# Patient Record
Sex: Male | Born: 1950
Health system: Southern US, Community
[De-identification: ages and names within clinical notes are randomized; demographics above are authoritative.]

## PROBLEM LIST (undated history)

## (undated) DIAGNOSIS — D696 Thrombocytopenia, unspecified: Secondary | ICD-10-CM

## (undated) DIAGNOSIS — IMO0002 Reserved for concepts with insufficient information to code with codable children: Secondary | ICD-10-CM

## (undated) DIAGNOSIS — N182 Chronic kidney disease, stage 2 (mild): Secondary | ICD-10-CM

## (undated) DIAGNOSIS — K219 Gastro-esophageal reflux disease without esophagitis: Secondary | ICD-10-CM

## (undated) DIAGNOSIS — J449 Chronic obstructive pulmonary disease, unspecified: Secondary | ICD-10-CM

## (undated) DIAGNOSIS — E669 Obesity, unspecified: Secondary | ICD-10-CM

## (undated) DIAGNOSIS — I2510 Atherosclerotic heart disease of native coronary artery without angina pectoris: Secondary | ICD-10-CM

## (undated) DIAGNOSIS — K5790 Diverticulosis of intestine, part unspecified, without perforation or abscess without bleeding: Secondary | ICD-10-CM

## (undated) DIAGNOSIS — M1990 Unspecified osteoarthritis, unspecified site: Secondary | ICD-10-CM

## (undated) DIAGNOSIS — I10 Essential (primary) hypertension: Secondary | ICD-10-CM

## (undated) DIAGNOSIS — M545 Low back pain, unspecified: Secondary | ICD-10-CM

## (undated) DIAGNOSIS — G4733 Obstructive sleep apnea (adult) (pediatric): Secondary | ICD-10-CM

## (undated) DIAGNOSIS — D126 Benign neoplasm of colon, unspecified: Secondary | ICD-10-CM

## (undated) DIAGNOSIS — I219 Acute myocardial infarction, unspecified: Secondary | ICD-10-CM

## (undated) DIAGNOSIS — J690 Pneumonitis due to inhalation of food and vomit: Secondary | ICD-10-CM

## (undated) DIAGNOSIS — T7840XA Allergy, unspecified, initial encounter: Secondary | ICD-10-CM

## (undated) DIAGNOSIS — F419 Anxiety disorder, unspecified: Secondary | ICD-10-CM

## (undated) DIAGNOSIS — I5032 Chronic diastolic (congestive) heart failure: Secondary | ICD-10-CM

## (undated) DIAGNOSIS — I4510 Unspecified right bundle-branch block: Secondary | ICD-10-CM

## (undated) DIAGNOSIS — I639 Cerebral infarction, unspecified: Secondary | ICD-10-CM

## (undated) DIAGNOSIS — E785 Hyperlipidemia, unspecified: Secondary | ICD-10-CM

## (undated) HISTORY — DX: Benign neoplasm of colon, unspecified: D12.6

## (undated) HISTORY — DX: Unspecified right bundle-branch block: I45.10

## (undated) HISTORY — PX: CARDIAC CATHETERIZATION: SHX172

## (undated) HISTORY — DX: Pneumonitis due to inhalation of food and vomit: J69.0

## (undated) HISTORY — DX: Chronic diastolic (congestive) heart failure: I50.32

## (undated) HISTORY — PX: UVULOPALATOPHARYNGOPLASTY: SHX827

## (undated) HISTORY — DX: Thrombocytopenia, unspecified: D69.6

## (undated) HISTORY — DX: Chronic kidney disease, stage 2 (mild): N18.2

## (undated) HISTORY — DX: Low back pain: M54.5

## (undated) HISTORY — DX: Gastro-esophageal reflux disease without esophagitis: K21.9

## (undated) HISTORY — DX: Essential (primary) hypertension: I10

## (undated) HISTORY — DX: Hyperlipidemia, unspecified: E78.5

## (undated) HISTORY — DX: Anxiety disorder, unspecified: F41.9

## (undated) HISTORY — DX: Atherosclerotic heart disease of native coronary artery without angina pectoris: I25.10

## (undated) HISTORY — DX: Obstructive sleep apnea (adult) (pediatric): G47.33

## (undated) HISTORY — DX: Unspecified osteoarthritis, unspecified site: M19.90

## (undated) HISTORY — DX: Diverticulosis of intestine, part unspecified, without perforation or abscess without bleeding: K57.90

## (undated) HISTORY — DX: Obesity, unspecified: E66.9

## (undated) HISTORY — DX: Low back pain, unspecified: M54.50

## (undated) HISTORY — DX: Reserved for concepts with insufficient information to code with codable children: IMO0002

## (undated) HISTORY — DX: Cerebral infarction, unspecified: I63.9

## (undated) HISTORY — DX: Acute myocardial infarction, unspecified: I21.9

## (undated) HISTORY — DX: Allergy, unspecified, initial encounter: T78.40XA

---

## 1998-10-15 ENCOUNTER — Ambulatory Visit: Admission: RE | Admit: 1998-10-15 | Discharge: 1998-10-15 | Payer: Self-pay | Admitting: Pulmonary Disease

## 1999-08-26 ENCOUNTER — Other Ambulatory Visit: Admission: RE | Admit: 1999-08-26 | Discharge: 1999-08-26 | Payer: Self-pay | Admitting: Gastroenterology

## 1999-08-26 ENCOUNTER — Encounter (INDEPENDENT_AMBULATORY_CARE_PROVIDER_SITE_OTHER): Payer: Self-pay | Admitting: Specialist

## 2002-04-19 DIAGNOSIS — I219 Acute myocardial infarction, unspecified: Secondary | ICD-10-CM

## 2002-04-19 HISTORY — DX: Acute myocardial infarction, unspecified: I21.9

## 2002-06-06 ENCOUNTER — Ambulatory Visit (HOSPITAL_COMMUNITY): Admission: RE | Admit: 2002-06-06 | Discharge: 2002-06-07 | Payer: Self-pay | Admitting: *Deleted

## 2002-06-06 ENCOUNTER — Encounter (INDEPENDENT_AMBULATORY_CARE_PROVIDER_SITE_OTHER): Payer: Self-pay | Admitting: Specialist

## 2003-01-08 ENCOUNTER — Inpatient Hospital Stay (HOSPITAL_COMMUNITY): Admission: AD | Admit: 2003-01-08 | Discharge: 2003-01-10 | Payer: Self-pay | Admitting: Internal Medicine

## 2003-01-10 ENCOUNTER — Encounter: Payer: Self-pay | Admitting: Internal Medicine

## 2003-01-21 ENCOUNTER — Encounter (HOSPITAL_COMMUNITY): Admission: RE | Admit: 2003-01-21 | Discharge: 2003-04-21 | Payer: Self-pay | Admitting: *Deleted

## 2003-02-16 ENCOUNTER — Emergency Department (HOSPITAL_COMMUNITY): Admission: EM | Admit: 2003-02-16 | Discharge: 2003-02-17 | Payer: Self-pay | Admitting: Emergency Medicine

## 2003-04-20 ENCOUNTER — Inpatient Hospital Stay (HOSPITAL_COMMUNITY): Admission: EM | Admit: 2003-04-20 | Discharge: 2003-04-21 | Payer: Self-pay | Admitting: Emergency Medicine

## 2003-04-20 ENCOUNTER — Encounter (INDEPENDENT_AMBULATORY_CARE_PROVIDER_SITE_OTHER): Payer: Self-pay | Admitting: Specialist

## 2004-03-04 ENCOUNTER — Ambulatory Visit: Payer: Self-pay | Admitting: Pulmonary Disease

## 2004-08-12 ENCOUNTER — Ambulatory Visit: Payer: Self-pay | Admitting: *Deleted

## 2004-08-31 ENCOUNTER — Ambulatory Visit: Payer: Self-pay | Admitting: Pulmonary Disease

## 2004-11-02 ENCOUNTER — Ambulatory Visit: Payer: Self-pay

## 2004-11-17 ENCOUNTER — Ambulatory Visit: Payer: Self-pay | Admitting: Internal Medicine

## 2004-12-10 ENCOUNTER — Ambulatory Visit: Payer: Self-pay | Admitting: Pulmonary Disease

## 2005-01-29 ENCOUNTER — Ambulatory Visit: Payer: Self-pay | Admitting: Internal Medicine

## 2005-03-10 ENCOUNTER — Ambulatory Visit: Payer: Self-pay | Admitting: Pulmonary Disease

## 2005-03-16 ENCOUNTER — Ambulatory Visit: Payer: Self-pay | Admitting: Internal Medicine

## 2005-04-28 ENCOUNTER — Encounter: Admission: RE | Admit: 2005-04-28 | Discharge: 2005-04-28 | Payer: Self-pay | Admitting: Family Medicine

## 2005-07-19 ENCOUNTER — Ambulatory Visit: Payer: Self-pay | Admitting: Internal Medicine

## 2005-09-02 ENCOUNTER — Ambulatory Visit: Payer: Self-pay | Admitting: Pulmonary Disease

## 2005-12-22 ENCOUNTER — Ambulatory Visit: Payer: Self-pay | Admitting: Gastroenterology

## 2006-01-19 ENCOUNTER — Ambulatory Visit: Payer: Self-pay | Admitting: Gastroenterology

## 2006-01-25 ENCOUNTER — Ambulatory Visit: Payer: Self-pay | Admitting: Internal Medicine

## 2006-07-19 ENCOUNTER — Ambulatory Visit: Payer: Self-pay | Admitting: Pulmonary Disease

## 2006-07-19 LAB — CONVERTED CEMR LAB
ALT: 24 units/L (ref 0–40)
AST: 22 units/L (ref 0–37)
Albumin: 3.3 g/dL — ABNORMAL LOW (ref 3.5–5.2)
Alkaline Phosphatase: 62 units/L (ref 39–117)
BUN: 14 mg/dL (ref 6–23)
Basophils Absolute: 0.1 10*3/uL (ref 0.0–0.1)
Basophils Relative: 0.7 % (ref 0.0–1.0)
Bilirubin, Direct: 0.1 mg/dL (ref 0.0–0.3)
CO2: 29 meq/L (ref 19–32)
Calcium: 9 mg/dL (ref 8.4–10.5)
Chloride: 103 meq/L (ref 96–112)
Cholesterol: 103 mg/dL (ref 0–200)
Creatinine, Ser: 1.1 mg/dL (ref 0.4–1.5)
Eosinophils Absolute: 0.3 10*3/uL (ref 0.0–0.6)
Eosinophils Relative: 3.5 % (ref 0.0–5.0)
GFR calc Af Amer: 89 mL/min
GFR calc non Af Amer: 74 mL/min
Glucose, Bld: 104 mg/dL — ABNORMAL HIGH (ref 70–99)
HCT: 41.4 % (ref 39.0–52.0)
HDL: 34.7 mg/dL — ABNORMAL LOW (ref >39.0)
Hemoglobin: 14.5 g/dL (ref 13.0–17.0)
LDL Cholesterol: 57 mg/dL (ref 0–99)
Lymphocytes Relative: 20.4 % (ref 12.0–46.0)
MCHC: 34.9 g/dL (ref 30.0–36.0)
MCV: 95.3 fL (ref 78.0–100.0)
Monocytes Absolute: 0.8 10*3/uL — ABNORMAL HIGH (ref 0.2–0.7)
Monocytes Relative: 8.8 % (ref 3.0–11.0)
Neutro Abs: 6.3 10*3/uL (ref 1.4–7.7)
Neutrophils Relative %: 66.6 % (ref 43.0–77.0)
PSA: 0.39 ng/mL (ref 0.10–4.00)
Platelets: 205 10*3/uL (ref 150–400)
Potassium: 4 meq/L (ref 3.5–5.1)
RBC: 4.34 M/uL (ref 4.22–5.81)
RDW: 11.8 % (ref 11.5–14.6)
Sodium: 138 meq/L (ref 135–145)
TSH: 1.12 microintl units/mL (ref 0.35–5.50)
Total Bilirubin: 0.6 mg/dL (ref 0.3–1.2)
Total CHOL/HDL Ratio: 3
Total Protein: 6.3 g/dL (ref 6.0–8.3)
Triglycerides: 58 mg/dL (ref 0–149)
VLDL: 12 mg/dL (ref 0–40)
WBC: 9.4 10*3/uL (ref 4.5–10.5)

## 2006-07-27 ENCOUNTER — Ambulatory Visit: Payer: Self-pay | Admitting: Internal Medicine

## 2006-09-11 ENCOUNTER — Emergency Department (HOSPITAL_COMMUNITY): Admission: EM | Admit: 2006-09-11 | Discharge: 2006-09-11 | Payer: Self-pay | Admitting: Emergency Medicine

## 2006-09-13 ENCOUNTER — Ambulatory Visit: Payer: Self-pay | Admitting: Pulmonary Disease

## 2006-12-21 ENCOUNTER — Ambulatory Visit: Payer: Self-pay | Admitting: Internal Medicine

## 2006-12-21 LAB — CONVERTED CEMR LAB
ALT: 31 units/L (ref 0–53)
AST: 21 units/L (ref 0–37)
Albumin: 3.6 g/dL (ref 3.5–5.2)
Alkaline Phosphatase: 78 units/L (ref 39–117)
BUN: 12 mg/dL (ref 6–23)
Bilirubin, Direct: 0.2 mg/dL (ref 0.0–0.3)
CO2: 27 meq/L (ref 19–32)
Calcium: 9 mg/dL (ref 8.4–10.5)
Chloride: 110 meq/L (ref 96–112)
Cholesterol: 115 mg/dL (ref 0–200)
Creatinine, Ser: 1 mg/dL (ref 0.4–1.5)
GFR calc Af Amer: 99 mL/min
GFR calc non Af Amer: 82 mL/min
Glucose, Bld: 108 mg/dL — ABNORMAL HIGH (ref 70–99)
HDL: 32.3 mg/dL — ABNORMAL LOW (ref >39.0)
LDL Cholesterol: 71 mg/dL (ref 0–99)
Potassium: 4.2 meq/L (ref 3.5–5.1)
Sodium: 141 meq/L (ref 135–145)
Total Bilirubin: 1.3 mg/dL — ABNORMAL HIGH (ref 0.3–1.2)
Total CHOL/HDL Ratio: 3.6
Total Protein: 6.3 g/dL (ref 6.0–8.3)
Triglycerides: 61 mg/dL (ref 0–149)
VLDL: 12 mg/dL (ref 0–40)

## 2007-01-03 ENCOUNTER — Ambulatory Visit: Payer: Self-pay | Admitting: Internal Medicine

## 2007-05-26 ENCOUNTER — Ambulatory Visit: Payer: Self-pay | Admitting: Internal Medicine

## 2007-07-04 ENCOUNTER — Encounter: Payer: Self-pay | Admitting: Pulmonary Disease

## 2007-07-04 ENCOUNTER — Ambulatory Visit: Payer: Self-pay | Admitting: Internal Medicine

## 2007-07-04 LAB — CONVERTED CEMR LAB
ALT: 34 units/L (ref 0–53)
AST: 24 units/L (ref 0–37)
Albumin: 3.7 g/dL (ref 3.5–5.2)
Alkaline Phosphatase: 71 units/L (ref 39–117)
BUN: 15 mg/dL (ref 6–23)
Bilirubin, Direct: 0.1 mg/dL (ref 0.0–0.3)
CO2: 31 meq/L (ref 19–32)
Calcium: 9 mg/dL (ref 8.4–10.5)
Chloride: 107 meq/L (ref 96–112)
Cholesterol: 117 mg/dL (ref 0–200)
Creatinine, Ser: 1.2 mg/dL (ref 0.4–1.5)
GFR calc Af Amer: 80 mL/min
GFR calc non Af Amer: 66 mL/min
Glucose, Bld: 114 mg/dL — ABNORMAL HIGH (ref 70–99)
HDL: 37.9 mg/dL — ABNORMAL LOW (ref >39.0)
Hgb A1c MFr Bld: 6.2 % — ABNORMAL HIGH (ref 4.6–6.0)
LDL Cholesterol: 70 mg/dL (ref 0–99)
Potassium: 5 meq/L (ref 3.5–5.1)
Sodium: 141 meq/L (ref 135–145)
Total Bilirubin: 0.8 mg/dL (ref 0.3–1.2)
Total CHOL/HDL Ratio: 3.1
Total Protein: 6.4 g/dL (ref 6.0–8.3)
Triglycerides: 46 mg/dL (ref 0–149)
VLDL: 9 mg/dL (ref 0–40)

## 2007-10-02 DIAGNOSIS — F411 Generalized anxiety disorder: Secondary | ICD-10-CM | POA: Insufficient documentation

## 2007-10-02 DIAGNOSIS — M545 Low back pain: Secondary | ICD-10-CM | POA: Insufficient documentation

## 2007-10-02 DIAGNOSIS — I10 Essential (primary) hypertension: Secondary | ICD-10-CM | POA: Insufficient documentation

## 2007-10-02 DIAGNOSIS — F419 Anxiety disorder, unspecified: Secondary | ICD-10-CM | POA: Insufficient documentation

## 2007-10-02 DIAGNOSIS — E785 Hyperlipidemia, unspecified: Secondary | ICD-10-CM | POA: Insufficient documentation

## 2007-10-02 DIAGNOSIS — G4733 Obstructive sleep apnea (adult) (pediatric): Secondary | ICD-10-CM | POA: Insufficient documentation

## 2007-10-10 ENCOUNTER — Telehealth: Payer: Self-pay | Admitting: Pulmonary Disease

## 2007-10-12 ENCOUNTER — Telehealth (INDEPENDENT_AMBULATORY_CARE_PROVIDER_SITE_OTHER): Payer: Self-pay | Admitting: *Deleted

## 2007-10-16 ENCOUNTER — Telehealth (INDEPENDENT_AMBULATORY_CARE_PROVIDER_SITE_OTHER): Payer: Self-pay | Admitting: *Deleted

## 2007-10-17 ENCOUNTER — Ambulatory Visit: Payer: Self-pay | Admitting: Pulmonary Disease

## 2007-10-17 DIAGNOSIS — K5730 Diverticulosis of large intestine without perforation or abscess without bleeding: Secondary | ICD-10-CM

## 2007-10-17 DIAGNOSIS — J42 Unspecified chronic bronchitis: Secondary | ICD-10-CM | POA: Insufficient documentation

## 2007-10-17 DIAGNOSIS — M1990 Unspecified osteoarthritis, unspecified site: Secondary | ICD-10-CM | POA: Insufficient documentation

## 2007-10-17 DIAGNOSIS — D126 Benign neoplasm of colon, unspecified: Secondary | ICD-10-CM

## 2007-10-17 DIAGNOSIS — R739 Hyperglycemia, unspecified: Secondary | ICD-10-CM

## 2007-10-17 DIAGNOSIS — K219 Gastro-esophageal reflux disease without esophagitis: Secondary | ICD-10-CM | POA: Insufficient documentation

## 2007-11-06 ENCOUNTER — Telehealth (INDEPENDENT_AMBULATORY_CARE_PROVIDER_SITE_OTHER): Payer: Self-pay | Admitting: *Deleted

## 2007-11-06 ENCOUNTER — Ambulatory Visit: Payer: Self-pay | Admitting: Internal Medicine

## 2007-11-08 ENCOUNTER — Telehealth: Payer: Self-pay | Admitting: Adult Health

## 2007-11-30 ENCOUNTER — Ambulatory Visit: Payer: Self-pay | Admitting: Internal Medicine

## 2007-11-30 ENCOUNTER — Ambulatory Visit: Payer: Self-pay

## 2008-02-01 ENCOUNTER — Telehealth: Payer: Self-pay | Admitting: Pulmonary Disease

## 2008-04-08 ENCOUNTER — Ambulatory Visit: Payer: Self-pay | Admitting: Pulmonary Disease

## 2008-05-15 ENCOUNTER — Telehealth: Payer: Self-pay | Admitting: Pulmonary Disease

## 2008-07-18 ENCOUNTER — Telehealth (INDEPENDENT_AMBULATORY_CARE_PROVIDER_SITE_OTHER): Payer: Self-pay | Admitting: *Deleted

## 2008-07-23 ENCOUNTER — Ambulatory Visit: Payer: Self-pay | Admitting: Internal Medicine

## 2008-09-09 ENCOUNTER — Ambulatory Visit: Payer: Self-pay | Admitting: Pulmonary Disease

## 2008-09-09 LAB — CONVERTED CEMR LAB: Streptococcus, Group A Screen (Direct): NEGATIVE

## 2008-10-04 ENCOUNTER — Telehealth: Payer: Self-pay | Admitting: Pulmonary Disease

## 2008-10-25 ENCOUNTER — Ambulatory Visit: Payer: Self-pay | Admitting: Pulmonary Disease

## 2008-10-30 ENCOUNTER — Telehealth (INDEPENDENT_AMBULATORY_CARE_PROVIDER_SITE_OTHER): Payer: Self-pay | Admitting: *Deleted

## 2009-03-06 ENCOUNTER — Telehealth: Payer: Self-pay | Admitting: Pulmonary Disease

## 2009-03-10 ENCOUNTER — Telehealth (INDEPENDENT_AMBULATORY_CARE_PROVIDER_SITE_OTHER): Payer: Self-pay | Admitting: *Deleted

## 2009-05-29 ENCOUNTER — Ambulatory Visit: Payer: Self-pay | Admitting: Internal Medicine

## 2009-06-03 ENCOUNTER — Encounter: Payer: Self-pay | Admitting: Internal Medicine

## 2009-06-09 ENCOUNTER — Ambulatory Visit (HOSPITAL_COMMUNITY): Admission: RE | Admit: 2009-06-09 | Discharge: 2009-06-09 | Payer: Self-pay | Admitting: Internal Medicine

## 2009-06-09 ENCOUNTER — Ambulatory Visit: Payer: Self-pay | Admitting: Cardiology

## 2009-06-13 ENCOUNTER — Ambulatory Visit: Payer: Self-pay | Admitting: Internal Medicine

## 2009-06-17 DIAGNOSIS — R911 Solitary pulmonary nodule: Secondary | ICD-10-CM

## 2009-06-19 ENCOUNTER — Telehealth (INDEPENDENT_AMBULATORY_CARE_PROVIDER_SITE_OTHER): Payer: Self-pay | Admitting: *Deleted

## 2009-07-18 ENCOUNTER — Telehealth (INDEPENDENT_AMBULATORY_CARE_PROVIDER_SITE_OTHER): Payer: Self-pay | Admitting: *Deleted

## 2009-07-21 ENCOUNTER — Ambulatory Visit: Payer: Self-pay | Admitting: Pulmonary Disease

## 2009-12-26 ENCOUNTER — Encounter: Payer: Self-pay | Admitting: Cardiology

## 2009-12-30 ENCOUNTER — Encounter: Payer: Self-pay | Admitting: Internal Medicine

## 2010-01-20 ENCOUNTER — Telehealth (INDEPENDENT_AMBULATORY_CARE_PROVIDER_SITE_OTHER): Payer: Self-pay | Admitting: *Deleted

## 2010-01-30 ENCOUNTER — Telehealth (INDEPENDENT_AMBULATORY_CARE_PROVIDER_SITE_OTHER): Payer: Self-pay | Admitting: *Deleted

## 2010-02-02 ENCOUNTER — Ambulatory Visit: Payer: Self-pay | Admitting: Pulmonary Disease

## 2010-02-02 ENCOUNTER — Ambulatory Visit: Payer: Self-pay | Admitting: Cardiology

## 2010-02-06 ENCOUNTER — Telehealth: Payer: Self-pay | Admitting: Internal Medicine

## 2010-02-13 ENCOUNTER — Ambulatory Visit: Payer: Self-pay | Admitting: Internal Medicine

## 2010-02-20 ENCOUNTER — Ambulatory Visit: Payer: Self-pay | Admitting: Internal Medicine

## 2010-02-20 ENCOUNTER — Encounter: Payer: Self-pay | Admitting: Cardiovascular Disease

## 2010-02-20 ENCOUNTER — Ambulatory Visit: Payer: Self-pay

## 2010-02-24 LAB — CONVERTED CEMR LAB
ALT: 20 units/L (ref 0–53)
AST: 17 units/L (ref 0–37)
Albumin: 3.5 g/dL (ref 3.5–5.2)
Alkaline Phosphatase: 79 units/L (ref 39–117)
BUN: 16 mg/dL (ref 6–23)
Bilirubin, Direct: 0.1 mg/dL (ref 0.0–0.3)
CO2: 29 meq/L (ref 19–32)
Calcium: 8.5 mg/dL (ref 8.4–10.5)
Chloride: 105 meq/L (ref 96–112)
Cholesterol: 114 mg/dL (ref 0–200)
Creatinine, Ser: 1 mg/dL (ref 0.4–1.5)
GFR calc non Af Amer: 79.25 mL/min (ref >60)
Glucose, Bld: 110 mg/dL — ABNORMAL HIGH (ref 70–99)
HDL: 39.3 mg/dL (ref >39.00)
Hgb A1c MFr Bld: 6 % (ref 4.6–6.5)
LDL Cholesterol: 64 mg/dL (ref 0–99)
Potassium: 3.9 meq/L (ref 3.5–5.1)
Sodium: 139 meq/L (ref 135–145)
Total Bilirubin: 0.7 mg/dL (ref 0.3–1.2)
Total CHOL/HDL Ratio: 3
Total Protein: 5.8 g/dL — ABNORMAL LOW (ref 6.0–8.3)
Triglycerides: 52 mg/dL (ref 0.0–149.0)
VLDL: 10.4 mg/dL (ref 0.0–40.0)

## 2010-05-09 ENCOUNTER — Encounter: Payer: Self-pay | Admitting: Emergency Medicine

## 2010-05-10 ENCOUNTER — Encounter: Payer: Self-pay | Admitting: Family Medicine

## 2010-05-17 LAB — CONVERTED CEMR LAB
ALT: 23 units/L (ref 0–53)
ALT: 27 units/L (ref 0–53)
AST: 20 units/L (ref 0–37)
AST: 22 units/L (ref 0–37)
Albumin: 3.7 g/dL (ref 3.5–5.2)
Albumin: 4.3 g/dL (ref 3.5–5.2)
Alkaline Phosphatase: 77 units/L (ref 39–117)
Alkaline Phosphatase: 92 units/L (ref 39–117)
BUN: 14 mg/dL (ref 6–23)
BUN: 15 mg/dL (ref 6–23)
Bacteria, UA: NEGATIVE
Basophils Absolute: 0 10*3/uL (ref 0.0–0.1)
Basophils Relative: 0.6 % (ref 0.0–1.0)
Bilirubin Urine: NEGATIVE
Bilirubin, Direct: 0.2 mg/dL (ref 0.0–0.3)
Bilirubin, Direct: 0.2 mg/dL (ref 0.0–0.3)
CO2: 26 meq/L (ref 19–32)
CO2: 30 meq/L (ref 19–32)
Calcium: 9 mg/dL (ref 8.4–10.5)
Calcium: 9.5 mg/dL (ref 8.4–10.5)
Chloride: 103 meq/L (ref 96–112)
Chloride: 105 meq/L (ref 96–112)
Cholesterol: 122 mg/dL (ref 0–200)
Cholesterol: 123 mg/dL (ref 0–200)
Creatinine, Ser: 1.1 mg/dL (ref 0.4–1.5)
Creatinine, Ser: 1.1 mg/dL (ref 0.4–1.5)
Crystals: NEGATIVE
Eosinophils Absolute: 0.3 10*3/uL (ref 0.0–0.7)
Eosinophils Relative: 4.7 % (ref 0.0–5.0)
GFR calc Af Amer: 89 mL/min
GFR calc non Af Amer: 72.82 mL/min (ref >60)
GFR calc non Af Amer: 73 mL/min
Glucose, Bld: 89 mg/dL (ref 70–99)
Glucose, Bld: 93 mg/dL (ref 70–99)
HCT: 42.3 % (ref 39.0–52.0)
HDL: 33 mg/dL — ABNORMAL LOW (ref >39.0)
HDL: 45.8 mg/dL (ref >39.00)
Hemoglobin, Urine: NEGATIVE
Hemoglobin: 15 g/dL (ref 13.0–17.0)
Hgb A1c MFr Bld: 5.9 % (ref 4.6–6.5)
Hgb A1c MFr Bld: 6.3 % — ABNORMAL HIGH (ref 4.6–6.0)
Ketones, ur: NEGATIVE mg/dL
LDL Cholesterol: 66 mg/dL (ref 0–99)
LDL Cholesterol: 78 mg/dL (ref 0–99)
Leukocytes, UA: NEGATIVE
Lymphocytes Relative: 23.5 % (ref 12.0–46.0)
MCHC: 35.4 g/dL (ref 30.0–36.0)
MCV: 94.4 fL (ref 78.0–100.0)
Monocytes Absolute: 0.7 10*3/uL (ref 0.1–1.0)
Monocytes Relative: 9.4 % (ref 3.0–12.0)
Mucus, UA: NEGATIVE
Neutro Abs: 4.4 10*3/uL (ref 1.4–7.7)
Neutrophils Relative %: 61.8 % (ref 43.0–77.0)
Nitrite: NEGATIVE
PSA: 0.31 ng/mL (ref 0.10–4.00)
Platelets: 141 10*3/uL — ABNORMAL LOW (ref 150–400)
Potassium: 3.9 meq/L (ref 3.5–5.1)
Potassium: 4.9 meq/L (ref 3.5–5.1)
RBC / HPF: NONE SEEN
RBC: 4.48 M/uL (ref 4.22–5.81)
RDW: 11.9 % (ref 11.5–14.6)
Sodium: 138 meq/L (ref 135–145)
Sodium: 140 meq/L (ref 135–145)
Specific Gravity, Urine: 1.01 (ref 1.000–1.03)
Squamous Epithelial / HPF: NEGATIVE /lpf
TSH: 1.99 microintl units/mL (ref 0.35–5.50)
Total Bilirubin: 0.9 mg/dL (ref 0.3–1.2)
Total Bilirubin: 1.1 mg/dL (ref 0.3–1.2)
Total CHOL/HDL Ratio: 3
Total CHOL/HDL Ratio: 3.7
Total Protein, Urine: NEGATIVE mg/dL
Total Protein: 7 g/dL (ref 6.0–8.3)
Total Protein: 7.1 g/dL (ref 6.0–8.3)
Triglycerides: 53 mg/dL (ref 0–149)
Triglycerides: 56 mg/dL (ref 0.0–149.0)
Urine Glucose: NEGATIVE mg/dL
Urobilinogen, UA: 0.2 (ref 0.0–1.0)
VLDL: 11 mg/dL (ref 0–40)
VLDL: 11.2 mg/dL (ref 0.0–40.0)
WBC, UA: NONE SEEN cells/hpf
WBC: 7 10*3/uL (ref 4.5–10.5)
pH: 6 (ref 5.0–8.0)

## 2010-05-19 NOTE — Assessment & Plan Note (Signed)
Summary: bp check/ will bring cuff/also labs/401.1/saf  Nurse Visit   Vital Signs:  Patient profile:   60 year old male Height:      70 inches Weight:      209 pounds BMI:     30.10 Pulse rate:   62 / minute BP sitting:   120 / 73  (right arm)  Vitals Entered By: Ollen Gross, RN, BSN (February 20, 2010 12:39 PM)  Impression & Recommendations:  Problem # 1:  HYPERTENSION (ICD-401.9)  His updated medication list for this problem includes:    Coreg 12.5 Mg Tabs (Carvedilol) .Marland Kitchen... Take 1 tab by mouth two times a day...    Amlodipine Besylate 5 Mg Tabs (Amlodipine besylate) .Marland Kitchen... 1 by mouth once daily    Lasix 40 Mg Tabs (Furosemide) .Marland Kitchen... As needed Pt. in for B/P check to compare with his home B/P machine. B/P taken with the office machine: Right arm 120/73, left arm 128/75 and 130/80, pulse 62 beats/min. B/P taken with pt's home machine: Right arm 130/78, left arm 134/77, pulse 62 beats/min. Pt. has no c/o at this time. I let pt. know the B/P results of the two machines, were similar. Pt. verbalized understanding.    Allergies: 1)  ! Aspirin 2)  ! * Avapro 3)  ! Altace (Ramipril) 4)  ! Codeine

## 2010-05-19 NOTE — Progress Notes (Signed)
Summary: refill- pt has been out of this--wellbutrin rx   Phone Note Call from Patient Call back at Home Phone [phone]   Caller: Patient Call For: nadel Summary of Call: [facility] [address] [facility] that they have faxed "several days" for a refill req- wellbutrin (generic) SR 150mg .  Initial call taken by: Tivis Ringer, CNA,  January 30, 2010 3:26 PM  Follow-up for Phone Call        per EMR, we sent rx electronically 01/16/2010 for 3 total fills.  called pharmacy and gave ok for refills.  Called and spoke with pt.  pt aware rx sent to pharmacy.  Aundra Millet Reynolds LPN  January 30, 2010 3:40 PM     Prescriptions: WELLBUTRIN SR 150 MG  TB12 (BUPROPION HCL) take one tablet by mouth two times a day  #60 Tablet x 2   Entered by:   Arman Filter LPN   Authorized by:   Michele Mcalpine MD   Signed by:   Arman Filter LPN on 47/82/9562   Method used:   Telephoned to ...       [facility]  [address] [id number]       [address]       [place], [place]  [address]       Ph: [phone]       Fax: [phone]   RxID:   [id number]

## 2010-05-19 NOTE — Letter (Signed)
Summary: ct reminder  [facility], Main Office  [address]   [place], [place] [address]   Phone: [phone]  Fax: [phone]        December 26, 2009 MRN: [id number]    Larry Stone [address] [place], [place]  [address]    Dear [name],  Our records indicate that it is time to schedule your follow-up CT Scan and your 6 month follow-up with Dr Gala Romney.  Please give our office a call to schedule these appointments.    Sincerely,  Meredith Staggers, RN Arvilla Meres, MD  This letter has been electronically signed by your physician.

## 2010-05-19 NOTE — Assessment & Plan Note (Signed)
Summary: F1Y/JSS  Medications Added LASIX 40 MG  TABS (FUROSEMIDE) as needed      Allergies Added:   Primary Provider:  nadel   CC:  follow up.  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  He also has residual 50% left main stenosis and nonobstructive disease elsewhere. Had cardiac CT in 2006. Remainder of his medical history is a notable for hyperglycemia, hyperlipidemia, hypertension, obstructive sleep apnea status post UPPP, angioedema with ACE inhibitors and aspirin allergy.  Returns for 1 year f/u. Doing great. has decided to make aggressive changes in his life and now walking 4 miles a day on treadmill, taking dnace classes and watching his diet. No CP, SOB. No problems with meds. No palpitations or CHF symptoms.   Current Medications (verified): 1)  Zyrtec Allergy 10 Mg  Tabs (Cetirizine Hcl) .... Take 1 Tablet By Mouth Once A Day 2)  Plavix 75 Mg  Tabs (Clopidogrel Bisulfate) .... Take 1 Tablet By Mouth Once A Day 3)  Coreg 12.5 Mg Tabs (Carvedilol) .... Take 1 Tab By Mouth Two Times A Day... 4)  Amlodipine Besylate 5 Mg Tabs (Amlodipine Besylate) .Marland Kitchen.. 1 By Mouth Once Daily 5)  Lasix 40 Mg  Tabs (Furosemide) .... As Needed 6)  Klor-Con 20 Meq  Pack (Potassium Chloride) .... Take 1 Tablet By Mouth Once A Day 7)  Lipitor 40 Mg Tabs (Atorvastatin Calcium) .... Take 1 Tab By Mouth At Bedtime 8)  Niaspan 500 Mg  Tbcr (Niacin (Antihyperlipidemic)) .... Take 1 Tablet By Mouth Four Times A Day 9)  Fish Oil 1000 Mg  Caps (Omega-3 Fatty Acids) .... 2 Caps Once Daily 10)  Viagra 100 Mg Tabs (Sildenafil Citrate) .... Take 1 Tablet As Needed 11)  Osteo Bi-Flex Adv Triple St  Tabs (Misc Natural Products) .... Take 1 Tablet By Mouth Once A Day 12)  Multivitamins   Tabs (Multiple Vitamin) .... Take 1 Tablet By Mouth Once A Day 13)   Wellbutrin Sr 150 Mg  Tb12 (Bupropion Hcl) .... Take One Tablet By Mouth Two Times A Day 14)  Alprazolam 0.5 Mg Tabs (Alprazolam) .... Take 1 Tablet By Mouth Three Times A Day As Needed For Nerves... 15)  Lac-Hydrin 12 % Lotn (Ammonium Lactate) .... Use As Directed...  Allergies (verified): 1)  ! Aspirin 2)  ! * Avapro 3)  ! Altace (Ramipril) 4)  ! Codeine   Past History:  Past Medical History: 1. 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      --cardiac CT 2006  residual 50% left main stenosis and nonobstructive disease elsewhere. EF normal 2. HYPERTENSION  3. HYPERLIPIDEMIA  4. PHARYNGITIS  5. URI  6. OBSTRUCTIVE SLEEP APNEA  7. Hx of BRONCHITIS, RECURRENT  8. DIABETES MELLITUS, BORDERLINE  9. GERD  10. DIVERTICULOSIS OF COLON  11. COLONIC POLYPS  12. DEGENERATIVE JOINT DISEASE  13. LOW BACK PAIN SYNDROME  14. ANXIETY   Review of Systems       As per HPI and past medical history; otherwise all systems negative.   Vital Signs:  Patient profile:   60 year old male Height:      70 inches Weight:      206 pounds BMI:     29.66 Pulse rate:   72 /  minute BP sitting:   124 / 76  (left arm) Cuff size:   regular  Vitals Entered By: Hardin Negus, RMA (May 29, 2009 2:55 PM)  Physical Exam  General:  Gen: well appearing. no resp difficulty HEENT: normal Neck: supple. no JVD. Carotids 2+ bilat; no bruits. No lymphadenopathy or thryomegaly appreciated. Cor: PMI nondisplaced. Regular rate & rhythm. No rubs, gallops, murmur. Lungs: clear Abdomen: soft, nontender, nondistended. No hepatosplenomegaly. No bruits or masses. Good bowel sounds. Extremities: no cyanosis, clubbing, rash, edema Neuro: alert & orientedx3, cranial nerves grossly intact. moves all 4 extremities w/o difficulty. affect pleasant    Impression & Recommendations:  Problem # 1:  ARTERIOSCLEROTIC HEART  DISEASE (ICD-414.00) Stable. No evidence of ischemia. Continue current regimen. Is very eager to know if LM disease has progressed over the past 5 years. i suggested a stress test but he would prefer to repeat cardiac CT. Will schedule.  Problem # 2:  HYPERTENSION (ICD-401.9) Blood pressure well controlled. Continue current regimen.  Problem # 3:  HYPERLIPIDEMIA (ICD-272.4) I congratulated him on his diet and lifestyle changes. Check lipids/cmet and hgba1c today.  Other Orders: EKG w/ Interpretation (93000) TLB-BMP (Basic Metabolic Panel-BMET) (80048-METABOL) TLB-Hepatic/Liver Function Pnl (80076-HEPATIC) TLB-Lipid Panel (80061-LIPID) TLB-A1C / Hgb A1C (Glycohemoglobin) (83036-A1C) Cardiac CTA (Cardiac CTA)  Patient Instructions: 1)  Labs today 2)  Your physician has requested that you have a cardiac CT.  Cardiac computed tomography (CT) is a painless test that uses an x-ray machine to take clear, detailed pictures of your heart.  For further information please visit [email or web address]  Please follow instruction sheet as given. 3)  Follow up in 1 year

## 2010-05-19 NOTE — Progress Notes (Signed)
Summary: Alvis Lemmings w/ sn  Phone Note Call from Patient Call back at Home Phone [phone]   Caller: Patient Call For: nadel Summary of Call: pt wants ov w/ sn asap re: recent f/u w/ dr bensimhon. says dr bensimhon found something that pt wants to discuss w/ dr Kriste Basque.  Initial call taken by: Tivis Ringer, CNA,  July 18, 2009 12:19 PM  Follow-up for Phone Call        please advise on possible slot. Thanks. [name] CMA  July 18, 2009 1:14 PM   called and spoke with pt and made appt for monday at 12 to see SN. Randell Loop CMA  July 18, 2009 4:07 PM

## 2010-05-19 NOTE — Assessment & Plan Note (Signed)
Summary: f67m  Medications Added TH CO Q-10 100 MG CAPS (COENZYME Q10) once daily      Allergies Added:   Visit Type:  6 month follow up Primary Provider:  nadel   CC:  no complaints.  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  He also has residual 50% left main stenosis and nonobstructive disease elsewhere.   Had cardiac CT in 05/2009. LM < 50% LAD 50% LCX dominant nonobs RCA small no critical lesions. Small lung nodule. F/u CT showed stable pulm nodule.  Remainder of his medical history is a notable for hyperglycemia, hyperlipidemia, hypertension, COPD, obstructive sleep apnea status post UPPP, angioedema with ACE inhibitors and aspirin allergy.  Returns for routine f/u. Had a period where he didn't have much energy. Started testosterone gel and feels much better. Back to his exercise and diet program. No CP or SOB. BP mildly elevated 145/70s. LDL in 05/2009 was 66.    Current Medications (verified): 1)  Zyrtec Allergy 10 Mg  Tabs (Cetirizine Hcl) .... Take 1 Tablet By Mouth Once A Day 2)  Plavix 75 Mg  Tabs (Clopidogrel Bisulfate) .... Take 1 Tablet By Mouth Once A Day 3)  Coreg 12.5 Mg Tabs (Carvedilol) .... Take 1 Tab By Mouth Two Times A Day... 4)  Amlodipine Besylate 5 Mg Tabs (Amlodipine Besylate) .Marland Kitchen.. 1 By Mouth Once Daily 5)  Lasix 40 Mg  Tabs (Furosemide) .... As Needed 6)  Klor-Con 20 Meq  Pack (Potassium Chloride) .... Take 1 Tablet By Mouth Once A Day 7)  Lipitor 40 Mg Tabs (Atorvastatin Calcium) .... Take 1 Tab By Mouth At Bedtime 8)  Niaspan 500 Mg  Tbcr (Niacin (Antihyperlipidemic)) .... Take 1 Tablet By Mouth Four Times A Day 9)  Fish Oil 1000 Mg  Caps (Omega-3 Fatty Acids) .... 2 Caps Once Daily 10)  Viagra 100 Mg Tabs (Sildenafil Citrate) .... Take 1 Tablet As Needed 11)  Osteo Bi-Flex Adv Triple St  Tabs (Misc  Natural Products) .... Take 1 Tablet By Mouth Once A Day 12)  Multivitamins   Tabs (Multiple Vitamin) .... Take 1 Tablet By Mouth Once A Day 13)  Wellbutrin Sr 150 Mg  Tb12 (Bupropion Hcl) .... Take One Tablet By Mouth Two Times A Day 14)  Alprazolam 0.5 Mg Tabs (Alprazolam) .... Take 1 Tablet By Mouth Three Times A Day As Needed For Nerves... 15)  Lac-Hydrin 12 % Lotn (Ammonium Lactate) .... Use As Directed... 16)  Nitrostat 0.4 Mg Subl (Nitroglycerin) .Marland Kitchen.. 1 Tablet Under Tongue At Onset of Chest Pain; You May Repeat Every 5 Minutes For Up To 3 Doses. 17)  Th Co Q-10 100 Mg Caps (Coenzyme Q10) .... Once Daily  Allergies (verified): 1)  ! Aspirin 2)  ! * Avapro 3)  ! Altace (Ramipril) 4)  ! Codeine  Past History:  Past Medical History: 1. 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      --cardiac CT 2006  residual 50% left main stenosis and nonobstructive disease elsewhere. EF normal   --cardiac CT 05/2009. LM. <50% LAD. 50% 2. HYPERTENSION  3. HYPERLIPIDEMIA  4. PHARYNGITIS  5. URI  6. OBSTRUCTIVE SLEEP APNEA  7. Hx of BRONCHITIS, RECURRENT  8. DIABETES MELLITUS, BORDERLINE  9. GERD  10. DIVERTICULOSIS OF  COLON 11. COLONIC POLYPS  12. DEGENERATIVE JOINT DISEASE  13. LOW BACK PAIN SYNDROME  14. ANXIETY   Vital Signs:  Patient profile:   60 year old male Height:      70 inches Weight:      209.50 pounds BMI:     30.17 Pulse rate:   64 / minute BP sitting:   112 / 68  (left arm) Cuff size:   regular  Vitals Entered By: Caralee Ates CMA (February 13, 2010 8:58 AM)  Physical Exam  General:  Well appearing. no resp difficulty HEENT: normal Neck: supple. no JVD. Carotids 2+ bilat; no bruits. No lymphadenopathy or thryomegaly appreciated. Cor: PMI nondisplaced. Regular rate & rhythm. No rubs, gallops, murmur. Lungs: clear Abdomen: soft, nontender, nondistended. No  hepatosplenomegaly. No bruits or masses. Good bowel sounds. Extremities: no cyanosis, clubbing, rash, edema Neuro: alert & orientedx3, cranial nerves grossly intact. moves all 4 extremities w/o difficulty. affect pleasant    Impression & Recommendations:  Problem # 1:  ARTERIOSCLEROTIC HEART DISEASE (ICD-414.00) Stable by cadiac CT. No evidence ischemia. Continue aggressive risk factor modification to prevent progression.   Problem # 2:  HYPERTENSION (ICD-401.9) BP high at home but looks good here. will have him bring cuff in to calibrate and keep log of BPs for [place] to review.  Problem # 3:  HYPERLIPIDEMIA (ICD-272.4) LDL at goal. Due for recheck.   Patient Instructions: 1)  Return for Nurse Visit next week for BP check, please bring your home BP cuff for comparison 2)  Your physician recommends that you return for a FASTING lipid, liver, bmet, and hgb a1c profile: next week (414.01, 272.0, 401.1) 3)  Follow up in 6 months  Appended Document: order    Clinical Lists Changes  Orders: Added new Service order of EKG w/ Interpretation (93000) - Signed

## 2010-05-19 NOTE — Assessment & Plan Note (Signed)
Summary: flu shot/cb   Nurse Visit   Allergies: 1)  ! Aspirin 2)  ! * Avapro 3)  ! Altace (Ramipril) 4)  ! Codeine  Orders Added: 1)  Admin 1st Vaccine [90471] 2)  Flu Vaccine 71yrs + [16109] Flu Vaccine Consent Questions     Do you have a history of severe allergic reactions to this vaccine? no    Any prior history of allergic reactions to egg and/or gelatin? no    Do you have a sensitivity to the preservative Thimersol? no    Do you have a past history of Guillan-Barre Syndrome? no    Do you currently have an acute febrile illness? no    Have you ever had a severe reaction to latex? no    Vaccine information given and explained to patient? yes    Are you currently pregnant? no    Lot Number:[id number]   Exp Date:10/17/2010   Site Given  Left Deltoid IMu  [name] [facility])  February 02, 2010 11:13 AM

## 2010-05-19 NOTE — Progress Notes (Signed)
Summary: cough/ sore throat---rx for Augmentin and Tussionex  Phone Note Call from Patient Call back at [facility] Phone [phone]   Caller: Patient Call For: nadel Summary of Call: pt c/o dry hacking cough/ sore throat x 2 days. wants to be seen or have rx called in (he won't go to [facility] to [facility]p because he lives in [place]). has taken coricidine OTC for cough w/ no relief. says he just feels "awful" no fever[facility] [address]  Initial call taken by: Tivis Ringer, CNA,  January 20, 2010 9:35 AM  Follow-up for Phone Call        called and spoke with pt.  pt states symptoms started yesterday.  Pt c/o ears stopped up, runny nose, sneezing, cough but unable to get anything up, "chest hurts and feels raw" and sore throat.  Pt denied fever.  Will forward message to SN to address.  Aundra Millet Reynolds LPN  January 20, 2010 9:45 AM  Allergies:  1)  ! Aspirin 2)  ! * Avapro 3)  ! Altace (Ramipril) 4)  ! Codeine  Additional Follow-up for Phone Call Additional follow up Details #1::        Per Dr Kriste Basque: Call in Augmentin 875 mg #14 tabs take one by mouth two times a day and OTC Mucinex 2 tabs by mouth two times a day with lots of fluids and Tussionex 4 oz take 1 tsp every 12 hrs as needed cough. Additional Follow-up by: Abigail Miyamoto RN,  January 20, 2010 11:20 AM    Additional Follow-up for Phone Call Additional follow up Details #2::    Pt not at home number-was given cell number to [phone];LMTCB. need to let pt know SN's recs.Reynaldo Minium CMA  January 20, 2010 11:25 AM   pt called back.  informed him of SN's recs and rx sent to pharmacy.  Aundra Millet Reynolds LPN  January 20, 2010 11:53 AM   New/Updated Medications: AUGMENTIN 875-125 MG TABS (AMOXICILLIN-POT CLAVULANATE) Take 1 tablet by mouth two times a day TUSSIONEX PENNKINETIC ER 10-8 MG/5ML LQCR (HYDROCOD POLST-CHLORPHEN POLST) 1 tsp every 12 hours as needed for cough Prescriptions: TUSSIONEX PENNKINETIC ER 10-8 MG/5ML LQCR  (HYDROCOD POLST-CHLORPHEN POLST) 1 tsp every 12 hours as needed for cough  #4 oz x 0   Entered by:   Arman Filter LPN   Authorized by:   Michele Mcalpine MD   Signed by:   Arman Filter LPN on 72/53/6644   Method used:   [facility]phoned [facility] ...       [facility]  [address] [facility]       [address]       [place], [place]  [address]       Ph: [phone]       Fax: [phone]   RxID:   [id number] AUGMENTIN 875-125 MG TABS (AMOXICILLIN-POT CLAVULANATE) Take 1 tablet by mouth two times a day  #14 x 0   Entered by:   Arman Filter LPN   Authorized by:   Michele Mcalpine MD   Signed by:   Arman Filter LPN on 22/05/5425   Method used:   Telephoned to ...       [facility]  [address] [id number]       [address]       [place], [place]  [address]  Ph: [phone]       Fax: [phone]   RxID:   [id number]

## 2010-05-19 NOTE — Letter (Signed)
Summary: Custom - Lipid  [facility], Main Office  [address]   [place], [place] [address]   Phone: [phone]  Fax: [phone]     June 03, 2009 MRN: [id number]   Larry Stone [address] [place], [place]  [address]   Dear Mr. Lagace,  We have reviewed your cholesterol results.  They are as follows:     Total Cholesterol:    123 (Desirable: less than 200)       HDL  Cholesterol:     45.80  (Desirable: greater than 40 for men and 50 for women)       LDL Cholesterol:       66  (Desirable: less than 100 for low risk and less than 70 for moderate to high risk)       Triglycerides:       56.0  (Desirable: less than 150)  Our recommendations include:  Looks great, your other labs including your hgb A1C was all ok as well, continue current medications.  If you have any questions feel free to give [place] a call back.   Call our office at the number listed above if you have any questions.  Lowering your LDL cholesterol is important, but it is only one of a large number of "risk factors" that may indicate that you are at risk for heart disease, stroke or other complications of hardening of the arteries.  Other risk factors include:   A.  Cigarette Smoking* B.  High Blood Pressure* C.  Obesity* D.   Low HDL Cholesterol (see yours above)* E.   Diabetes Mellitus (higher risk if your is uncontrolled) F.  Family history of premature heart disease G.  Previous history of stroke or cardiovascular disease    *These are risk factors YOU HAVE CONTROL OVER.  For more information, visit .  There is now evidence that lowering the TOTAL CHOLESTEROL AND LDL CHOLESTEROL can reduce the risk of heart disease.  The American Heart Association recommends the following guidelines for the treatment of elevated cholesterol:  1.  If there is now current heart disease and less than two risk factors, TOTAL CHOLESTEROL should be less than 200 and LDL CHOLESTEROL should be less  than 100. 2.  If there is current heart disease or two or more risk factors, TOTAL CHOLESTEROL should be less than 200 and LDL CHOLESTEROL should be less than 70.  A diet low in cholesterol, saturated fat, and calories is the cornerstone of treatment for elevated cholesterol.  Cessation of smoking and exercise are also important in the management of elevated cholesterol and preventing vascular disease.  Studies have shown that 30 to 60 minutes of physical activity most days can help lower blood pressure, lower cholesterol, and keep your weight at a healthy level.  Drug therapy is used when cholesterol levels do not respond to therapeutic lifestyle changes (smoking cessation, diet, and exercise) and remains unacceptably high.  If medication is started, it is important to have you levels checked periodically to evaluate the need for further treatment options.  Thank you,    [employer] Team

## 2010-05-19 NOTE — Miscellaneous (Signed)
Summary: Orders Update  Clinical Lists Changes  Orders: Added new Test order of Vascular Screening  (Vas. screening) - Signed 

## 2010-05-19 NOTE — Progress Notes (Signed)
Summary: speak to nurse  Phone Note Call from Patient Call back at Home Phone [phone]   Caller: Patient Call For: nadel Reason for Call: Talk to Nurse Summary of Call: Pt has scratchy throat, dry hacky cough, wants something called in.//[facility] [address] Initial call taken by: Darletta Moll,  June 19, 2009 11:32 AM  Follow-up for Phone Call        Spoke with pt.  Last seen in July 2010.  He c/o dry, hacky cough and scratchy throat x 3 days.  Would like something called in.  I offered appt with TP in [place] this afternoon but pt refused stating that this it would be too long of a drive.  Will forward to TP for recs.  Please advise thanks Vernie Murders  June 19, 2009 11:36 AM   Additional Follow-up for Phone Call Additional follow up Details #1::        needs ov,  can see on frid Additional Follow-up by: Rubye Oaks NP,  June 19, 2009 11:45 AM    Additional Follow-up for Phone Call Additional follow up Details #2::    Spoke with pt and made aware of recs per TP.  He was sched for appt with TP for tommorrow am at 10:45 am. Follow-up by: Vernie Murders,  June 19, 2009 11:54 AM

## 2010-05-19 NOTE — Assessment & Plan Note (Signed)
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  He also has residual 50% left main stenosis and nonobstructive disease elsewhere. Had cardiac CT in 2006. Remainder of his medical history is a notable for hyperglycemia, hyperlipidemia, hypertension, obstructive sleep apnea status post UPPP, angioedema with ACE inhibitors and aspirin allergy.  Returns today with his wife to go over results of cardiac CT. Continues to be very active without CP or dyspnea. Flushing with Niaspan. Hard to tolerate.  Cardiac CT shows < 50% LM lesion, 50% LAD lesion. Thickened esophagus and small lung nodule.   Current Medications (verified): 1)  Zyrtec Allergy 10 Mg  Tabs (Cetirizine Hcl) .... Take 1 Tablet By Mouth Once A Day 2)  Plavix 75 Mg  Tabs (Clopidogrel Bisulfate) .... Take 1 Tablet By Mouth Once A Day 3)  Coreg 12.5 Mg Tabs (Carvedilol) .... Take 1 Tab By Mouth Two Times A Day... 4)  Amlodipine Besylate 5 Mg Tabs (Amlodipine Besylate) .Marland Kitchen.. 1 By Mouth Once Daily 5)  Lasix 40 Mg  Tabs (Furosemide) .... As Needed 6)  Klor-Con 20 Meq  Pack (Potassium Chloride) .... Take 1 Tablet By Mouth Once A Day 7)  Lipitor 40 Mg Tabs (Atorvastatin Calcium) .... Take 1 Tab By Mouth At Bedtime 8)  Niaspan 500 Mg  Tbcr (Niacin (Antihyperlipidemic)) .... Take 1 Tablet By Mouth Four Times A Day 9)  Fish Oil 1000 Mg  Caps (Omega-3 Fatty Acids) .... 2 Caps Once Daily 10)  Viagra 100 Mg Tabs (Sildenafil Citrate) .... Take 1 Tablet As Needed 11)  Osteo Bi-Flex Adv Triple St  Tabs (Misc Natural Products) .... Take 1 Tablet By Mouth Once A Day 12)  Multivitamins   Tabs (Multiple Vitamin) .... Take 1 Tablet By Mouth Once A Day 13)  Wellbutrin Sr 150 Mg  Tb12  (Bupropion Hcl) .... Take One Tablet By Mouth Two Times A Day 14)  Alprazolam 0.5 Mg Tabs (Alprazolam) .... Take 1 Tablet By Mouth Three Times A Day As Needed For Nerves... 15)  Lac-Hydrin 12 % Lotn (Ammonium Lactate) .... Use As Directed...  Allergies (verified): 1)  ! Aspirin 2)  ! * Avapro 3)  ! Altace (Ramipril) 4)  ! Codeine  Past History:  Past Medical History: Last updated: 05/29/2009 1. 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      --cardiac CT 2006  residual 50% left main stenosis and nonobstructive disease elsewhere. EF normal 2. HYPERTENSION  3. HYPERLIPIDEMIA  4. PHARYNGITIS  5. URI  6. OBSTRUCTIVE SLEEP APNEA  7. Hx of BRONCHITIS, RECURRENT  8. DIABETES MELLITUS, BORDERLINE  9. GERD  10. DIVERTICULOSIS OF COLON  11. COLONIC POLYPS  12. DEGENERATIVE JOINT DISEASE  13. LOW BACK PAIN SYNDROME  14. ANXIETY   Review of Systems       As per HPI and past medical history; otherwise all systems negative.   Vital Signs:  Patient profile:   60 year old male Height:      70 inches Weight:      205 pounds BMI:     29.52 Pulse rate:   68 / minute  Resp:     16 per minute BP sitting:   128 / 82  (left arm)  Vitals Entered By: Marrion Coy, CNA (June 13, 2009 9:35 AM)  Physical Exam  General:  Gen: well appearing. no resp difficulty HEENT: normal Neck: supple. no JVD. Carotids 2+ bilat; no bruits. No lymphadenopathy or thryomegaly appreciated. Cor: PMI nondisplaced. Regular rate & rhythm. No rubs, gallops, murmur. Lungs: clear Abdomen: soft, nontender, nondistended. No hepatosplenomegaly. No bruits or masses. Good bowel sounds. Extremities: no cyanosis, clubbing, rash, edema Neuro: alert & orientedx3, cranial nerves grossly intact. moves all 4 extremities w/o difficulty. affect pleasant    Impression & Recommendations:  Problem # 1:  ARTERIOSCLEROTIC HEART  DISEASE (ICD-414.00) Results of CT reviewed with him and his wife in depth. CAD appears stable if not regressed some. He was reassured. Continue RF management.  Problem # 2:  HYPERTENSION (ICD-401.9) Blood pressure well controlled. Continue current regimen.  Problem # 3:  HYPERLIPIDEMIA (ICD-272.4) Lipids look great. HDL is fine. Can back down on Niaspan some if hard to tolerate.  Problem # 4:  PULMONARY NODULE (ICD-518.89) Will need f/u CT scan in 6 months to ensure stability. Will schedule.   Patient Instructions: 1)  Follow up in 6 months

## 2010-05-19 NOTE — Miscellaneous (Signed)
Summary: order f/u CT  Clinical Lists Changes  Orders: Added new Referral order of CT Scan  (CT Scan) - Signed

## 2010-05-19 NOTE — Progress Notes (Signed)
Summary: test results/**rtn your call/lg   Phone Note Call from Patient Call back at Home Phone [phone]   Caller: Patient Reason for Call: Talk to Nurse, Lab or Test Results Summary of Call: pt wants to know ct scan results. Initial call taken by: [name],  February 06, 2010 9:53 AM  Follow-up for Phone Call        Left message to call back Meredith Staggers, RN  February 06, 2010 11:57 AM   Additional Follow-up for Phone Call Additional follow up Details #1::        pt rtn your call Omer Jack  February 06, 2010 1:24 PM   pt given CT results Meredith Staggers, RN  February 06, 2010 1:31 PM

## 2010-05-19 NOTE — Assessment & Plan Note (Signed)
Summary: discuss results/la   Primary Care Provider:  Dexter Sauser   CC:  9 month ROV & discuss recent Cardiac CT....  History of Present Illness: 60 y/o WM here for a followup visit... he has multiple medical problems as noted below...  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   ~  Dec09:  he has had a good 6 months except for a sinus infection 7/09 treated w/ ZPak, Medrol Dosepak, Mucinex, Saline, etc...   ~  Jul10:  here for CPX- he just ret from a 3 week trip to [place]- did well without problems... he wants Alprazolam refilled for nerves, and a cream to use for dry skin on hands... Amlodipine 5mg  was added for HBP by TP in April & improved- needs better diet & get weight down!   ~  July 21, 2009:  he never returned for fasting labs after his CPX 7/10... saw DrBensimhon 2/11 w/ f/u Cardiac CT showing <50% Lmain stenosis, 50% midLAD, additional nonobstructive dis; incidental 5mm RML nodule seen + mild fatty liver & ?mild thickening of esoph... we discussed the 5mm nodule & rec f/u CT in 65mo to check for stability... his BP remains under good control;  Chol numbers look fine  on his meds;  he has done a fab job w/ weight reduction down 16# to 209# today...    Current Problem List:  **note** ALLERGIC to ASA w/ hives, & ACE inhibitors/ ARBs too w/ angioedema.  OBSTRUCTIVE SLEEP APNEA (ICD-327.23) - s/p UPPP surgery in 2004 by DrRedman...  states he's doing OK, denies snoring, no daytime hypersom, wife not complaining... uses ZYRTEK for allergy symtoms.  Hx of BRONCHITIS, RECURRENT (ICD-491.9) - he is an ex-smoker, prev 1 ppd for 32yrs, quit in 2004.  HYPERTENSION (ICD-401.9) - on COREG 12.5mg Bid, AMLODIPINE 5mg /d, & LASIX 40mg - only as needed... BP= 138.78, taking meds regularly & tol well... incr stress w/ mother's stroke and he's the care giver... denies HA, visual changes, CP, palipit,  dizziness, syncope, dyspnea, edema, etc...  ARTERIOSCLEROTIC HEART DISEASE (ICD-414.00) - on PLAVIX 75mg /d, allergic to ASA... followed by DrBensimhon and seen regularly...   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  ~  f/u Cardiac CT 2/11 showed>  IMPRESSION: Calcified plaque in the left main with < 50% stenosis.  Calcified plaque in the proximal and mid LAD with at most moderate (around 50%) stenosis in the mid LAD.  The CFX is dominant, supplying the left PDA.  The AV CFX appears patent with mild stenosis at most. There are 2 moderate, closely spaced obtuse marginal vessels that have calcified plaque but are patent.  They are not seen well enough to comment on degree of stenosis within the vessels.  The RCA is small and nondominant.  HYPERLIPIDEMIA (ICD-272.4) - on LIPITOR 40mg /d, NIASPAN 500Qid, FISH OIL daily...  ~  FLP3/09 showed TChol 117, TG 46, HDL 38, LDL 70  ~  FLP 7/10 > pt never ret for fasting labs after this visit.  ~  FLP 2/11 showed TChol 123, TG 56, HDL 46, LDL 66  DIABETES MELLITUS, BORDERLINE (ICD-790.29) - on diet alone...  ~  labs 3/09 (wt=225#) showed BS= 114, HgA1c= 6.2  ~  labs 7/10 (wt=229#) > pt never ret for fasting labs after this ov.  ~  labs 2/11 (wt=206#) showed BS= 93, A1c= 5.9  GERD (ICD-530.81) -  he uses H2 blockers as needed.  DIVERTICULOSIS OF COLON (ICD-562.10) & COLONIC POLYPS (ICD-211.3) - his last polyps were  ~51mm and removed in 2004= adenomatous... last colonoscopy 10/07 showed divertics only... f/u planned 63yrs... there is a +fam hx of colon cancer in his father who died at age 62... (he tells me he would prefer to have colonoscopies every 58yrs).  DEGENERATIVE JOINT DISEASE (ICD-715.90) - he reports doing fair- mostly c/o knees & hands... on Glucosamine/ Chondroitin supplements.  LOW BACK PAIN SYNDROME (ICD-724.2)  ANXIETY (ICD-300.00) - on WELLBUTRIN 150mg Bid, & ALPRAZOLAM 0.5mg  Prn... increased  stress w/ mother's stroke last year... he's the main care giver w/ 2 sisters who don't help much...    Allergies: 1)  ! Aspirin 2)  ! * Avapro 3)  ! Altace (Ramipril) 4)  ! Codeine  Comments:  Nurse/Medical Assistant: The patient's medications and allergies were reviewed with the patient and were updated in the Medication and Allergy Lists.  Past History:  Past Medical History: 1. 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      --cardiac CT 2006  residual 50% left main stenosis and nonobstructive disease elsewhere. EF normal 2. HYPERTENSION  3. HYPERLIPIDEMIA  4. PHARYNGITIS  5. URI  6. OBSTRUCTIVE SLEEP APNEA  7. Hx of BRONCHITIS, RECURRENT  8. DIABETES MELLITUS, BORDERLINE  9. GERD  10. DIVERTICULOSIS OF COLON 11. COLONIC POLYPS  12. DEGENERATIVE JOINT DISEASE  13. LOW BACK PAIN SYNDROME  14. ANXIETY   Past Surgical History: S/P UPPP surgery for OSA  Family History: Reviewed history from 10/25/2008 and no changes required. Father died age 32 w/ colon cancer, hx stroke Mother alive, age 64, w/ stroke, arthritis 2 Siblings- sisters w/ HBP, DM  Social History: Reviewed history from 10/25/2008 and no changes required. Married 1 child ex-smoker, quit 2005... social alcohol exercises some retired  Review of Systems      See HPI  The patient denies anorexia, fever, weight loss, weight gain, vision loss, decreased hearing, hoarseness, chest pain, syncope, dyspnea on exertion, peripheral edema, prolonged cough, headaches, hemoptysis, abdominal pain, melena, hematochezia, severe indigestion/heartburn, hematuria, incontinence, muscle weakness, suspicious skin lesions, transient blindness, difficulty walking, depression, unusual weight change, abnormal bleeding, enlarged lymph nodes, and angioedema.    Vital Signs:  Patient profile:   60 year old male Height:      70  inches Weight:      209.25 pounds BMI:     30.13 O2 Sat:      99 % on Room air Temp:     96.9 degrees F oral Pulse rate:   69 / minute BP sitting:   138 / 78  (right arm) Cuff size:   regular  Vitals Entered By: Randell Loop CMA (July 21, 2009 12:08 PM)  O2 Sat at Rest %:  99 O2 Flow:  Room air CC: 9 month ROV & discuss recent Cardiac CT... Is Patient Diabetic? No Pain Assessment Patient in pain? no      Comments NO CHANGES IN MEDS TODAY   Physical Exam  Additional Exam:  WD, sl overweight, 60 y/o WM in NAD... wt is actually down 16# to 209#... GENERAL:  Alert & oriented; pleasant & cooperative... HEENT:  [place]/AT, EOM-wnl, PERRLA, EACs-clear, TMs-wnl, NOSE-clear, THROAT-s/p UPPP surg... NECK:  Supple w/ fairROM; no JVD; normal carotid impulses w/o bruits; no thyromegaly or nodules palpated; no lymphadenopathy. CHEST:  Clear  to P & A; without wheezes/ rales/ or rhonchi. HEART:  Regular Rhythm; without murmurs/ rubs/ or gallops. ABDOMEN:  Soft & nontender; normal bowel sounds; no organomegaly or masses detected. EXT: without deformities, mild arthritic changes; no varicose veins/ venous insuffic/ or edema. Neuro:  intact w/o focal abn detected... DERM:  No lesions noted; no rash etc...    MISC. Report  Procedure date:  07/21/2009  Findings:      DATA REVIEWED:  Office notes from DrBensimhon, last= 06/13/09... [facility] CT report & [facility] films 06/09/09 reviewed... Lab cumulative summary sheets and FLP 05/29/09...  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 lesion is not visible on his prev CXR's... we discussed f/u CT in 73mo >> he will call us in Aug11 & we will set up f/u labs & CT Chest w/ contrast at that time, then plan ROV to discuss after CT is done...  Problem # 2:  HYPERTENSION (ICD-401.9) Controlled on meds-  continue same... His updated  medication list for this problem includes:    Coreg 12.5 Mg Tabs (Carvedilol) .Marland Kitchen... Take 1 tab by mouth two times a day...    Amlodipine Besylate 5 Mg Tabs (Amlodipine besylate) .Marland Kitchen... 1 by mouth once daily    Lasix 40 Mg Tabs (Furosemide) .Marland Kitchen... As needed  Problem # 3:  ARTERIOSCLEROTIC HEART DISEASE (ICD-414.00) Followed by DrBensimhon as outlined... His updated medication list for this problem includes:    Plavix 75 Mg Tabs (Clopidogrel bisulfate) .Marland Kitchen... Take 1 tablet by mouth once a day    Coreg 12.5 Mg Tabs (Carvedilol) .Marland Kitchen... Take 1 tab by mouth two times a day...    Amlodipine Besylate 5 Mg Tabs (Amlodipine besylate) .Marland Kitchen... 1 by mouth once daily    Lasix 40 Mg Tabs (Furosemide) .Marland Kitchen... As needed  Problem # 4:  HYPERLIPIDEMIA (ICD-272.4) FLP looks great on meds... His updated medication list for this problem includes:    Lipitor 40 Mg Tabs (Atorvastatin calcium) .Marland Kitchen... Take 1 tab by mouth at bedtime    Niaspan 500 Mg Tbcr (Niacin (antihyperlipidemic)) .Marland Kitchen... Take 1 tablet by mouth four times a day  Problem # 5:  DIABETES MELLITUS, BORDERLINE (ICD-790.29) Sugars are normal w/ weight reduction...  Problem # 6:  COLONIC POLYPS (ICD-211.3) Pos fam hx and f/u colon due 10/12...  Problem # 7:  OTHER MEDICAL PROBLEMS AS NOTED>>>  Complete Medication List: 1)  Zyrtec Allergy 10 Mg Tabs (Cetirizine hcl) .... Take 1 tablet by mouth once a day 2)  Plavix 75 Mg Tabs (Clopidogrel bisulfate) .... Take 1 tablet by mouth once a day 3)  Coreg 12.5 Mg Tabs (Carvedilol) .... Take 1 tab by mouth two times a day... 4)  Amlodipine Besylate 5 Mg Tabs (Amlodipine besylate) .Marland Kitchen.. 1 by mouth once daily 5)  Lasix 40 Mg Tabs (Furosemide) .... As needed 6)  Klor-con 20 Meq Pack (Potassium chloride) .... Take 1 tablet by mouth once a day 7)  Lipitor 40 Mg Tabs (Atorvastatin calcium) .... Take 1 tab by mouth at bedtime 8)  Niaspan 500 Mg Tbcr (Niacin (antihyperlipidemic)) .... Take 1 tablet by mouth four times a  day 9)  Fish Oil 1000 Mg Caps (Omega-3 fatty acids) .... 2 caps once daily 10)  Viagra 100 Mg Tabs (Sildenafil citrate) .... Take 1 tablet as needed 11)  Osteo Bi-flex Adv Triple St Tabs (Misc natural products) .... Take 1  tablet by mouth once a day 12)  Multivitamins Tabs (Multiple vitamin) .... Take 1 tablet by mouth once a day 13)  Wellbutrin Sr 150 Mg Tb12 (Bupropion hcl) .... Take one tablet by mouth two times a day 14)  Alprazolam 0.5 Mg Tabs (Alprazolam) .... Take 1 tablet by mouth three times a day as needed for nerves... 15)  Lac-hydrin 12 % Lotn (Ammonium lactate) .... Use as directed...  Other Orders: Prescription Created Electronically [id number])  Patient Instructions: 1)  Today we updated your med list- see below.... 2)  I wrote perscriptions for Zyrtek & Osteo-biflex per your request... 3)  We discussed your recent Cardiac CT & the incidental finding of a 5mm nodule in the RML.Marland KitchenMarland Kitchen 4)  I suggest that we recheck this area on CT in 6 months... 5)  Please give [place] a call in early August 2011 - we will then set up the CT Scan & lab work required... then we will set up an appt shortly thereafter to discuss the results.Marland KitchenMarland Kitchen 6)  Call for any problems... Prescriptions: OSTEO BI-FLEX ADV TRIPLE ST  TABS (MISC NATURAL PRODUCTS) Take 1 tablet by mouth once a day  #30 x prn   Entered and Authorized by:   Michele Mcalpine MD   Signed by:   Michele Mcalpine MD on 07/21/2009   Method used:   Print then Give to Patient   RxID:   [id number] ZYRTEC ALLERGY 10 MG  TABS (CETIRIZINE HCL) Take 1 tablet by mouth once a day  #30 x prn   Entered and Authorized by:   Michele Mcalpine MD   Signed by:   Michele Mcalpine MD on 07/21/2009   Method used:   Print then Give to Patient   RxID:   [id number]

## 2010-06-04 ENCOUNTER — Telehealth: Payer: Self-pay | Admitting: Pulmonary Disease

## 2010-06-08 ENCOUNTER — Other Ambulatory Visit: Payer: Self-pay | Admitting: Pulmonary Disease

## 2010-06-08 ENCOUNTER — Encounter (INDEPENDENT_AMBULATORY_CARE_PROVIDER_SITE_OTHER): Payer: 59 | Admitting: Pulmonary Disease

## 2010-06-08 ENCOUNTER — Other Ambulatory Visit: Payer: 59

## 2010-06-08 ENCOUNTER — Encounter: Payer: Self-pay | Admitting: Pulmonary Disease

## 2010-06-08 DIAGNOSIS — Z0000 Encounter for general adult medical examination without abnormal findings: Secondary | ICD-10-CM

## 2010-06-08 LAB — CBC WITH DIFFERENTIAL/PLATELET
Basophils Absolute: 0 10*3/uL (ref 0.0–0.1)
Basophils Relative: 0.4 % (ref 0.0–3.0)
Eosinophils Absolute: 0.3 10*3/uL (ref 0.0–0.7)
Eosinophils Relative: 3 % (ref 0.0–5.0)
HCT: 47.7 % (ref 39.0–52.0)
Hemoglobin: 16.4 g/dL (ref 13.0–17.0)
Lymphocytes Relative: 15.6 % (ref 12.0–46.0)
Lymphs Abs: 1.8 10*3/uL (ref 0.7–4.0)
MCHC: 34.3 g/dL (ref 30.0–36.0)
MCV: 99.4 fl (ref 78.0–100.0)
Monocytes Absolute: 1.1 10*3/uL — ABNORMAL HIGH (ref 0.1–1.0)
Monocytes Relative: 9.9 % (ref 3.0–12.0)
Neutro Abs: 8.1 10*3/uL — ABNORMAL HIGH (ref 1.4–7.7)
Neutrophils Relative %: 71.1 % (ref 43.0–77.0)
Platelets: 156 10*3/uL (ref 150.0–400.0)
RBC: 4.8 Mil/uL (ref 4.22–5.81)
RDW: 13.4 % (ref 11.5–14.6)
WBC: 11.4 10*3/uL — ABNORMAL HIGH (ref 4.5–10.5)

## 2010-06-08 LAB — BASIC METABOLIC PANEL
BUN: 21 mg/dL (ref 6–23)
CO2: 33 mEq/L — ABNORMAL HIGH (ref 19–32)
Calcium: 9.5 mg/dL (ref 8.4–10.5)
Chloride: 101 mEq/L (ref 96–112)
Creatinine, Ser: 1.1 mg/dL (ref 0.4–1.5)
GFR: 71.07 mL/min (ref >60.00)
Glucose, Bld: 84 mg/dL (ref 70–99)
Potassium: 5.1 mEq/L (ref 3.5–5.1)
Sodium: 140 mEq/L (ref 135–145)

## 2010-06-08 LAB — HEMOGLOBIN A1C: Hgb A1c MFr Bld: 6.1 % (ref 4.6–6.5)

## 2010-06-08 LAB — TESTOSTERONE: Testosterone: 364.14 ng/dL (ref 350.00–890.00)

## 2010-06-08 LAB — PSA: PSA: 0.43 ng/mL (ref 0.10–4.00)

## 2010-06-10 NOTE — Progress Notes (Signed)
Summary: would like an antibiotic call in  Phone Note Call from Patient   Caller: Patient Call For: Shavonn Convey Summary of Call: Patient phoned stated that his symptoms started yesterday he sounds congested he has a headache with pain in his eyes and he doesnt feel good wants to know if an antibiotic can be called into [facility] on [address] [phone]. Patient can be reached at [phone]. He has an appt on 2/20 for his annual Initial call taken by: Vedia Coffer,  June 04, 2010 9:30 AM  Follow-up for Phone Call        called and spoke with pt---some cough at night----only has headache and sinus congestion---just does not feel well---requesting abx and medrol dose pak....please advise Randell Loop [facility]  June 04, 2010 11:45 AM   Additional Follow-up for Phone Call Additional follow up Details #1::        per SN----ok for zpak #1  take as directed and pred dosepak #1  5 mg   6 day pack with no refills.   this has been sent to the pharmacy and pt is aware Randell Loop CMA  June 04, 2010 1:58 PM     New/Updated Medications: ZITHROMAX Z-PAK 250 MG TABS (AZITHROMYCIN) take as directed PREDNISONE (PAK) 5 MG TABS (PREDNISONE) take as directed---please give a 6 day pack Prescriptions: PREDNISONE (PAK) 5 MG TABS (PREDNISONE) take as directed---please give a 6 day pack  #1 pack x 0   Entered by:   Randell Loop CMA   Authorized by:   Michele Mcalpine MD   Signed by:   Randell Loop CMA on 06/04/2010   Method used:   Electronically to        [facility]  [address] [phone]* (retail)       [address]       [place], [place]  [address]       Ph: [phone]       Fax: [phone]   RxID:   [id number] ZITHROMAX Z-PAK 250 MG TABS (AZITHROMYCIN) take as directed  #1 pack x 0   Entered by:   Randell Loop CMA   Authorized by:   Michele Mcalpine MD   Signed by:   Randell Loop CMA on 06/04/2010   Method used:   [facility]ically to        [facility]  [address] [phone]* (retail)  [address]       [place], [place]  [address]       Ph: [phone]       Fax: [phone]   RxID:   [id number]

## 2010-06-15 ENCOUNTER — Telehealth (INDEPENDENT_AMBULATORY_CARE_PROVIDER_SITE_OTHER): Payer: Self-pay | Admitting: *Deleted

## 2010-06-16 NOTE — Assessment & Plan Note (Signed)
Summary: CPX//SH   Primary Care Provider:  Yarel Rushlow   CC:  10 month ROV & CPX today....  History of Present Illness: 60 y/o WM here for a followup visit... he has multiple medical problems as noted below...  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   ~  July 21, 2009:  he never returned for fasting labs after his CPX 7/10... saw DrBensimhon 2/11 w/ f/u Cardiac CT showing <50% Lmain stenosis, 50% midLAD, additional nonobstructive dis; incidental 5mm RML nodule seen + mild fatty liver & ?mild thickening of esoph... we discussed the 5mm nodule & rec f/u CT in 33mo to check for stability... his BP remains under good control;  Chol numbers look fine  on his meds;  he has done a fab job w/ weight reduction down 16# to 209# today...   ~  June 08, 2010:  67mo ROV- recent URI w/ ZPak & Mucinex called in & feeling better overall;  requests written Rx for mult OTC meds so he can use hid "flex acct"...    He saw DrBensimhon 10/11 for f/u CAD & doing satis- no changes made;  he had Vasc Screen w/ mild carotid dis "some plaque", & norm AbdAo & ABIs;  also had f/u CT Chest for f/u tiny 3mm nodule RML- unchanged xyrs & benign, mild centilob emphysema, +coronary calcif, NAD...    He had FLP 11/11- looked good on his regimen of Lip40, NiaspanQid, FishOil;  BS remains stable on diet alone;  colonoscopy due 10/12 per GI;  remains stable on Wellbutrin & Alpraz (he wishes to continue same).    Current Problem List:  **note** ALLERGIC to ASA w/ hives, & ACE inhibitors/ ARBs too w/ angioedema.  OBSTRUCTIVE SLEEP APNEA (ICD-327.23) - s/p UPPP surgery in 2004 by DrRedman...  states he's doing OK, denies snoring, no daytime hypersom, wife not complaining... uses ZYRTEK for allergy symtoms.  Hx of BRONCHITIS, RECURRENT (ICD-491.9) - he is an ex-smoker, prev 1 ppd for 63yrs, quit in 2004.  ~  Hx tiny RML nodule  seen on Cardiac CT 2/11 & apparently unchanged from old CTChest 2004...  ~  f/u Chest CT 10/11 w/ 3mm RML nodule, no change & benign...  HYPERTENSION (ICD-401.9) - on COREG 12.5mg Bid, AMLODIPINE 5mg /d, & LASIX 40mg - only as needed, + KCl daily ("DrBensimhon said to take it every day")... BP= 118/74, taking meds regularly & tol well... incr stress w/ mother's stroke and he's the care giver... denies HA, visual changes, CP, palipit, dizziness, syncope, dyspnea, edema, etc...  ARTERIOSCLEROTIC HEART DISEASE (ICD-414.00) - on PLAVIX 75mg /d, allergic to ASA... followed by DrBensimhon and seen regularly...   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  ~  f/u Cardiac CT 2/11 showed>  IMPRESSION: Calcified plaque in the left main with < 50% stenosis.  Calcified plaque in the proximal and mid LAD with at most moderate (around 50%) stenosis in the mid LAD.  The CFX is dominant, supplying the left PDA.  The AV CFX appears patent with mild stenosis at most. There are 2 moderate, closely spaced obtuse marginal vessels that have calcified plaque but are patent.  They are not seen well enough to comment on degree of stenosis within the vessels.  The RCA is small and nondominant.  ~  Vasc Screen per Cards 10/11 showed mild carotid plaques, norm AbdAo &  ABIs...  HYPERLIPIDEMIA (ICD-272.4) - on LIPITOR 40mg /d, NIASPAN 500Qid, FISH OIL daily...  ~  FLP3/09 showed TChol 117, TG 46, HDL 38, LDL 70  ~  FLP 7/10 > pt never ret for fasting labs after this visit.  ~  FLP 2/11 showed TChol 123, TG 56, HDL 46, LDL 66  ~  FLP 11/11 showed TChol 114, TG 52, HDL 39, LDL 64  DIABETES MELLITUS, BORDERLINE (ICD-790.29) - on diet alone...  ~  labs 3/09 (wt=225#) showed BS= 114, HgA1c= 6.2  ~  labs 7/10 (wt=229#) > pt never ret for fasting labs after this ov.  ~  labs 2/11 (wt=206#) showed BS= 93, A1c= 5.9  ~  labs 2/12 (wt=214#) showed BS= 84, A1c= 6.1  GERD (ICD-530.81) -  he uses H2 blockers as needed.  DIVERTICULOSIS OF COLON (ICD-562.10) & COLONIC POLYPS (ICD-211.3) - his last polyps were  ~79mm and removed in 2004= adenomatous... last colonoscopy 10/07 showed divertics only... f/u planned 35yrs... there is a +fam hx of colon cancer in his father who died at age 68... (he tells me he would prefer to have colonoscopies every 59yrs).  ~  f/u colon due 10/12...  DEGENERATIVE JOINT DISEASE (ICD-715.90) - he reports doing fair- mostly c/o knees & hands ("I messed up my knee in a fall")... he saw Ortho ?who? on Glucosamine/ Chondroitin supplements.  LOW BACK PAIN SYNDROME (ICD-724.2)  ANXIETY (ICD-300.00) - on WELLBUTRIN 150mg Bid, & ALPRAZOLAM 0.5mg  Prn... increased stress w/ mother's stroke last year... he's the main care giver w/ 2 sisters who don't help much...   Health Maintenance:  ~  GI:  due for f/u colonoscopy 10/12...  ~  GU:  DRE is neg, PSA=0.43, Testos= 364 (350-890), on Viagra Prn...  ~  Immuniztions:     Preventive Screening-Counseling & Management  Alcohol-Tobacco     Smoking Status: quit     Packs/Day: 2004     Year Quit: 2001     Pack years: 29yrs, less than 1ppd  Allergies: 1)  ! Aspirin 2)  ! * Avapro 3)  ! Altace (Ramipril) 4)  ! Codeine  Comments:  Nurse/Medical Assistant: The patient's medications and allergies were reviewed with the patient and were updated in the Medication and Allergy Lists.  Past History:  Past Medical History: 1. 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      --cardiac CT 2006  residual 50% left main stenosis and nonobstructive disease elsewhere. EF normal   --cardiac CT 05/2009. LM. <50% LAD. 50% 2. HYPERTENSION  3. HYPERLIPIDEMIA  4. PHARYNGITIS  5. URI  6. OBSTRUCTIVE SLEEP APNEA  7. Hx of BRONCHITIS, RECURRENT  8. DIABETES MELLITUS, BORDERLINE  9. GERD  10. DIVERTICULOSIS OF COLON 11. COLONIC POLYPS    12. DEGENERATIVE JOINT DISEASE  13. LOW BACK PAIN SYNDROME  14. ANXIETY  Past Surgical History: S/P UPPP surgery for OSA  Family History: Reviewed history from 10/25/2008 and no changes required. Father died age 105 w/ colon cancer, hx stroke Mother alive, age 65, w/ stroke, arthritis 2 Siblings- sisters w/ HBP, DM  Social History: Reviewed history from 10/25/2008 and no changes required. Married 1 child ex-smoker, quit 2005... social alcohol exercises some retired  Review of Systems       The patient complains of dyspnea on exertion.  The patient denies fever, chills, sweats, anorexia, fatigue, weakness, malaise, weight loss, sleep disorder, blurring, diplopia, eye  irritation, eye discharge, vision loss, eye pain, photophobia, earache, ear discharge, tinnitus, decreased hearing, nasal congestion, nosebleeds, sore throat, hoarseness, chest pain, palpitations, syncope, orthopnea, PND, peripheral edema, cough, dyspnea at rest, excessive sputum, hemoptysis, wheezing, pleurisy, nausea, vomiting, diarrhea, constipation, change in bowel habits, abdominal pain, melena, hematochezia, jaundice, gas/bloating, indigestion/heartburn, dysphagia, odynophagia, dysuria, hematuria, urinary frequency, urinary hesitancy, nocturia, incontinence, back pain, joint pain, joint swelling, muscle cramps, muscle weakness, stiffness, arthritis, sciatica, restless legs, leg pain at night, leg pain with exertion, rash, itching, dryness, suspicious lesions, paralysis, paresthesias, seizures, tremors, vertigo, transient blindness, frequent falls, frequent headaches, difficulty walking, depression, anxiety, memory loss, confusion, cold intolerance, heat intolerance, polydipsia, polyphagia, polyuria, unusual weight change, abnormal bruising, bleeding, enlarged lymph nodes, urticaria, allergic rash, hay fever, and recurrent infections.    Vital Signs:  Patient profile:   60 year old male Height:      70 inches Weight:       213.38 pounds BMI:     30.73 O2 Sat:      99 % on Room air Temp:     96.8 degrees F oral Pulse rate:   60 / minute BP sitting:   118 / 74  (left arm) Cuff size:   regular  Vitals Entered By: Randell Loop CMA (June 08, 2010 9:40 AM)  O2 Sat at Rest %:  99 O2 Flow:  Room air CC: 10 month ROV & CPX today... Is Patient Diabetic? No Pain Assessment Patient in pain? no      Comments meds updated today with pt   Physical Exam  Additional Exam:  WD, sl overweight, 60 y/o WM in NAD... GENERAL:  Alert & oriented; pleasant & cooperative... HEENT:  [place]/AT, EOM-wnl, PERRLA, EACs-clear, TMs-wnl, NOSE-clear, THROAT-s/p UPPP surg... NECK:  Supple w/ fairROM; no JVD; normal carotid impulses w/o bruits; no thyromegaly or nodules palpated; no lymphadenopathy. CHEST:  Clear to P & A; without wheezes/ rales/ or rhonchi. HEART:  Regular Rhythm; without murmurs/ rubs/ or gallops. ABDOMEN:  Soft & nontender; normal bowel sounds; no organomegaly or masses detected. EXT: without deformities, mild arthritic changes; no varicose veins/ venous insuffic/ or edema. Neuro:  intact w/o focal abn detected... DERM:  No lesions noted; no rash etc...    Impression & Recommendations:  Problem # 1:  PHYSICAL EXAMINATION (ICD-V70.0) He requested check for "low-T" & lab ret 364 (low normal range) (350-890)... He is planning trip to [place] & wants transderm-scop... Orders: EKG w/ Interpretation (93000) TLB-BMP (Basic Metabolic Panel-BMET) (80048-METABOL) TLB-A1C / Hgb A1C (Glycohemoglobin) (83036-A1C) TLB-CBC Platelet - w/Differential (85025-CBCD) TLB-PSA (Prostate Specific Antigen) (84153-PSA) TLB-Testosterone, Total (84403-TESTO)  Problem # 2:  Hx of BRONCHITIS, RECURRENT (ICD-491.9) Hx recent URI w/ ZPak, Pred Dosepak called in & improving...  Problem # 3:  HYPERTENSION (ICD-401.9) Controlled on meds>  continue same. His updated medication list for this problem includes:    Coreg 12.5 Mg  Tabs (Carvedilol) .Marland Kitchen... Take 1 tab by mouth two times a day...    Amlodipine Besylate 5 Mg Tabs (Amlodipine besylate) .Marland Kitchen... 1 by mouth once daily    Lasix 40 Mg Tabs (Furosemide) .Marland Kitchen... As needed  Problem # 4:  ARTERIOSCLEROTIC HEART DISEASE (ICD-414.00) Followed by Cards- DrBensimhon>  stable, same meds. His updated medication list for this problem includes:    Plavix 75 Mg Tabs (Clopidogrel bisulfate) .Marland Kitchen... Take 1 tablet by mouth once a day    Coreg 12.5 Mg Tabs (Carvedilol) .Marland Kitchen... Take 1 tab by mouth two times a day...    Amlodipine Besylate  5 Mg Tabs (Amlodipine besylate) .Marland Kitchen... 1 by mouth once daily    Lasix 40 Mg Tabs (Furosemide) .Marland Kitchen... As needed    Nitrostat 0.4 Mg Subl (Nitroglycerin) .Marland Kitchen... 1 tablet under tongue at onset of chest pain; you may repeat every 5 minutes for up to 3 doses.  Problem # 5:  HYPERLIPIDEMIA (ICD-272.4) Stable on diet + 3 meds listed>  continue same & DrBensimhon wants him on the Qid Niaspan, he says. His updated medication list for this problem includes:    Lipitor 40 Mg Tabs (Atorvastatin calcium) .Marland Kitchen... Take 1 tab by mouth at bedtime    Niaspan 500 Mg Tbcr (Niacin (antihyperlipidemic)) .Marland Kitchen... Take 1 tablet by mouth four times a day  Problem # 6:  DIABETES MELLITUS, BORDERLINE (ICD-790.29) Diet controlled>  continue same, get wt down!!!  Problem # 7:  COLONIC POLYPS (ICD-211.3) He will be due for colonoscopy 10/12 & he knows to call if not notified by GI ahead of time...  Problem # 8:  ANXIETY (ICD-300.00) He stable stable & wants to continue both meds... His updated medication list for this problem includes:    Wellbutrin Sr 150 Mg Tb12 (Bupropion hcl) .Marland Kitchen... Take one tablet by mouth two times a day    Alprazolam 0.5 Mg Tabs (Alprazolam) .Marland Kitchen... Take 1 tablet by mouth three times a day as needed for nerves...  Complete Medication List: 1)  Zyrtec Allergy 10 Mg Tabs (Cetirizine hcl) .... Take 1 tablet by mouth once a day 2)  Plavix 75 Mg Tabs (Clopidogrel  bisulfate) .... Take 1 tablet by mouth once a day 3)  Coreg 12.5 Mg Tabs (Carvedilol) .... Take 1 tab by mouth two times a day... 4)  Amlodipine Besylate 5 Mg Tabs (Amlodipine besylate) .Marland Kitchen.. 1 by mouth once daily 5)  Lasix 40 Mg Tabs (Furosemide) .... As needed 6)  Klor-con 20 Meq Pack (Potassium chloride) .... Take 1 tablet by mouth once a day 7)  Lipitor 40 Mg Tabs (Atorvastatin calcium) .... Take 1 tab by mouth at bedtime 8)  Niaspan 500 Mg Tbcr (Niacin (antihyperlipidemic)) .... Take 1 tablet by mouth four times a day 9)  Fish Oil 1000 Mg Caps (Omega-3 fatty acids) .... 2 caps once daily 10)  Viagra 100 Mg Tabs (Sildenafil citrate) .... Take 1 tablet as needed 11)  Osteo Bi-flex Adv Triple St Tabs (Misc natural products) .... Take 1 tablet by mouth once a day 12)  Multivitamins Tabs (Multiple vitamin) .... Take 1 tablet by mouth once a day 13)  Wellbutrin Sr 150 Mg Tb12 (Bupropion hcl) .... Take one tablet by mouth two times a day 14)  Alprazolam 0.5 Mg Tabs (Alprazolam) .... Take 1 tablet by mouth three times a day as needed for nerves... 15)  Lac-hydrin 12 % Lotn (Ammonium lactate) .... Use as directed... 16)  Nitrostat 0.4 Mg Subl (Nitroglycerin) .Marland Kitchen.. 1 tablet under tongue at onset of chest pain; you may repeat every 5 minutes for up to 3 doses. 17)  Transderm-scop 1.5 Mg Pt72 (Scopolamine base) .Marland Kitchen.. 1 patch behind ear every 3 days...  Patient Instructions: 1)  Today we updated your med list- see below.... 2)  We refilled the "flex-meds" you requested... 3)  Today we did your additional blood work to complete the  4)  physical requirements... please call the "phone tree" in a few days for your lab results.Marland KitchenMarland Kitchen 5)  Let's get on track w/ our diet & exercise program... the goal is to lose 15-20 lbs!!! 6)  Call for  any problems.Marland KitchenMarland Kitchen 7)  Please schedule a follow-up appointment in 6 months. Prescriptions: TRANSDERM-SCOP 1.5 MG PT72 (SCOPOLAMINE BASE) 1 patch behind ear every 3 days...  #2  boxes x 3   Entered and Authorized by:   Michele Mcalpine MD   Signed by:   Michele Mcalpine MD on 06/08/2010   Method used:   Print then Give to Patient   RxID:   [id number]

## 2010-06-18 ENCOUNTER — Ambulatory Visit (INDEPENDENT_AMBULATORY_CARE_PROVIDER_SITE_OTHER): Payer: 59 | Admitting: Pulmonary Disease

## 2010-06-18 ENCOUNTER — Encounter: Payer: Self-pay | Admitting: Pulmonary Disease

## 2010-06-18 DIAGNOSIS — Z23 Encounter for immunization: Secondary | ICD-10-CM

## 2010-06-18 DIAGNOSIS — F488 Other specified nonpsychotic mental disorders: Secondary | ICD-10-CM

## 2010-06-18 DIAGNOSIS — E291 Testicular hypofunction: Secondary | ICD-10-CM

## 2010-06-25 NOTE — Assessment & Plan Note (Signed)
Summary: discuss lab results / cj   Primary Care Provider:  Tilden Broz   CC:  Add-on at pt request to discuss his labs & mult questions....  History of Present Illness: 60 y/o WM here for a followup visit... he has multiple medical problems as noted below...  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   ~  July 21, 2009:  he never returned for fasting labs after his CPX 7/10... saw DrBensimhon 2/11 w/ f/u Cardiac CT showing <50% Lmain stenosis, 50% midLAD, additional nonobstructive dis; incidental 5mm RML nodule seen + mild fatty liver & ?mild thickening of esoph... we discussed the 5mm nodule & rec f/u CT in 62mo to check for stability... his BP remains under good control;  Chol numbers look fine  on his meds;  he has done a fab job w/ weight reduction down 16# to 209# today...   ~  June 08, 2010:  73mo ROV- recent URI w/ ZPak & Mucinex called in & feeling better overall;  requests written Rx for mult OTC meds so he can use hid "flex acct"...    He saw DrBensimhon 10/11 for f/u CAD & doing satis- no changes made;  he had Vasc Screen w/ mild carotid dis "some plaque", & norm AbdAo & ABIs;  also had f/u CT Chest for f/u tiny 3mm nodule RML- unchanged xyrs & benign, mild centilob emphysema, +coronary calcif, NAD...    He had FLP 11/11- looked good on his regimen of Lip40, NiaspanQid, FishOil;  BS remains stable on diet alone;  colonoscopy due 10/12 per GI;  remains stable on Wellbutrin & Alpraz (he wishes to continue same).   ~  June 18, 2010:  Add-on at pt request for mult questions> he wants me to know that he just feels terrible & that his "quality of life is going down over the last yr";  he notes that he exercises at the gym regularly & the trainer can't understand why he isn't gaining muscle, and he can't get rid of his gut despite watching his diet, & he notes sexual dysfunction, etc... he  has checked everything on the internet & he wants to try TESTIM Gel for 62mo to see if this makes a diff for him;  recent labs w/ Testos level = 364 (350-890) & so we will go along w/ his desire for this trial> offered consideration of other meds, and offered Urology referral for second opinion & poss Testos shots but he declines & wants to try what he has researched>  we wrote Rx for TESTIM Gel 5gm tubes- start w/ one tube rubbed into skin of arms/ shoulders dailt for several weeks & incr to 2 tubes thereafter (he says his insurance will pay for up to 3 tubes per day)... he is rec to ret for Testos level in  ~3months (he will call when ready).   Current Problem List:  **note** ALLERGIC to ASA w/ hives, & ACE inhibitors/ ARBs too w/ angioedema.  OBSTRUCTIVE SLEEP APNEA (ICD-327.23) - s/p UPPP surgery in 2004 by DrRedman...  states he's doing OK, denies snoring, no daytime hypersom, wife not complaining... uses ZYRTEK for allergy symtoms.  Hx of BRONCHITIS, RECURRENT (ICD-491.9) - he is an ex-smoker, prev 1 ppd for 70yrs, quit in 2004.  ~  Hx tiny RML nodule seen on Cardiac CT 2/11 & apparently unchanged from old CTChest 2004...  ~  f/u Chest CT 10/11 w/ 3mm RML nodule, no change & benign...  HYPERTENSION (ICD-401.9) - on COREG 12.5mg Bid, AMLODIPINE 5mg /d, & LASIX 40mg - only as needed, + KCl daily ("DrBensimhon said to take it every day")... BP= 118/74, taking meds regularly & tol well... incr stress w/ mother's stroke and he's the care giver... denies HA, visual changes, CP, palipit, dizziness, syncope, dyspnea, edema, etc...  ARTERIOSCLEROTIC HEART DISEASE (ICD-414.00) - on PLAVIX 75mg /d, allergic to ASA... followed by DrBensimhon and seen regularly...   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  ~  f/u Cardiac CT 2/11 showed>  IMPRESSION: Calcified plaque in the left main with < 50% stenosis.  Calcified plaque in the proximal and mid LAD with at most  moderate (around 50%) stenosis in the mid LAD.  The CFX is dominant, supplying the left PDA.  The AV CFX appears patent with mild stenosis at most. There are 2 moderate, closely spaced obtuse marginal vessels that have calcified plaque but are patent.  They are not seen well enough to comment on degree of stenosis within the vessels.  The RCA is small and nondominant.  ~  Vasc Screen per Cards 10/11 showed mild carotid plaques, norm AbdAo & ABIs...  HYPERLIPIDEMIA (ICD-272.4) - on LIPITOR 40mg /d, NIASPAN 500Qid, FISH OIL daily...  ~  FLP3/09 showed TChol 117, TG 46, HDL 38, LDL 70  ~  FLP 7/10 > pt never ret for fasting labs after this visit.  ~  FLP 2/11 showed TChol 123, TG 56, HDL 46, LDL 66  ~  FLP 11/11 showed TChol 114, TG 52, HDL 39, LDL 64  DIABETES MELLITUS, BORDERLINE (ICD-790.29) - on diet alone...  ~  labs 3/09 (wt=225#) showed BS= 114, HgA1c= 6.2  ~  labs 7/10 (wt=229#) > pt never ret for fasting labs after this ov.  ~  labs 2/11 (wt=206#) showed BS= 93, A1c= 5.9  ~  labs 2/12 (wt=214#) showed BS= 84, A1c= 6.1  GERD (ICD-530.81) - he uses H2 blockers as needed.  DIVERTICULOSIS OF COLON (ICD-562.10) & COLONIC POLYPS (ICD-211.3) - his last polyps were  ~39mm and removed in 2004= adenomatous... last colonoscopy 10/07 showed divertics only... f/u planned 39yrs... there is a +fam hx of colon cancer in his father who died at age 45... (he tells me he would prefer to have colonoscopies every 53yrs).  ~  f/u colon due 10/12...  DEGENERATIVE JOINT DISEASE (ICD-715.90) - he reports doing fair- mostly c/o knees & hands ("I messed up my knee in a fall")... he saw Ortho ?who? on Glucosamine/ Chondroitin supplements.  LOW BACK PAIN SYNDROME (ICD-724.2)  ANXIETY (ICD-300.00) - on WELLBUTRIN 150mg Bid, & ALPRAZOLAM 0.5mg  Prn... increased stress w/ mother's stroke last year... he's the main care giver w/ 2 sisters who don't help much...   Health Maintenance:  ~  GI:  due for f/u colonoscopy  10/12...  ~  GU:  DRE is neg, PSA=0.43, Testos= 364 (350-890), on Viagra Prn...  ~  Immuniztions:  he is encouraged to get the yearly Flu vaccine... given PNEUMOVAX 2/12...   Preventive Screening-Counseling & Management  Alcohol-Tobacco     Smoking Status: quit     Packs/Day: 2004     Year Quit: 2001     Pack years: 52yrs, less than 1ppd  Allergies: 1)  ! Aspirin 2)  ! * Avapro 3)  ! Altace (Ramipril) 4)  ! Codeine  Comments:  Nurse/Medical Assistant: The patient's medications and allergies  were reviewed with the patient and were updated in the Medication and Allergy Lists.  Past History:  Past Medical History: 1. 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      --cardiac CT 2006  residual 50% left main stenosis and nonobstructive disease elsewhere. EF normal   --cardiac CT 05/2009. LM. <50% LAD. 50% 2. HYPERTENSION  3. HYPERLIPIDEMIA  4. PHARYNGITIS  5. URI  6. OBSTRUCTIVE SLEEP APNEA  7. Hx of BRONCHITIS, RECURRENT  8. DIABETES MELLITUS, BORDERLINE  9. GERD  10. DIVERTICULOSIS OF COLON 11. COLONIC POLYPS  12. DEGENERATIVE JOINT DISEASE  13. LOW BACK PAIN SYNDROME  14. ANXIETY  Past Surgical History: S/P UPPP surgery for OSA  Family History: Reviewed history from 06/08/2010 and no changes required. Father died age 69 w/ colon cancer, hx stroke Mother alive, age 74, w/ stroke, arthritis 2 Siblings- sisters w/ HBP, DM  Social History: Reviewed history from 06/08/2010 and no changes required. Married 1 child ex-smoker, quit 2005... social alcohol exercises some retired  Review of Systems      See HPI       The patient complains of dyspnea on exertion and muscle weakness.  The patient denies anorexia, fever, weight loss, weight gain, vision loss, decreased hearing, hoarseness, chest pain, syncope, peripheral edema, prolonged cough, headaches, hemoptysis, abdominal  pain, melena, hematochezia, severe indigestion/heartburn, hematuria, incontinence, suspicious skin lesions, transient blindness, difficulty walking, depression, unusual weight change, abnormal bleeding, enlarged lymph nodes, and angioedema.    Vital Signs:  Patient profile:   60 year old male Height:      70 inches Weight:      215.13 pounds BMI:     30.98 O2 Sat:      96 % on Room air Temp:     98.9 degrees F oral Pulse rate:   89 / minute BP sitting:   128 / 70  (left arm) Cuff size:   regular  Vitals Entered By: Randell Loop CMA (June 18, 2010 3:32 PM)  O2 Sat at Rest %:  96 O2 Flow:  Room air CC: Add-on at pt request to discuss his labs & mult questions... Is Patient Diabetic? No Pain Assessment Patient in pain? no      Comments no changes in meds today   Physical Exam  Additional Exam:  WD, sl overweight, 60 y/o WM in NAD... GENERAL:  Alert & oriented; pleasant & cooperative... HEENT:  [place]/AT, EOM-wnl, PERRLA, EACs-clear, TMs-wnl, NOSE-clear, THROAT-s/p UPPP surg... NECK:  Supple w/ fairROM; no JVD; normal carotid impulses w/o bruits; no thyromegaly or nodules palpated; no lymphadenopathy. CHEST:  Clear to P & A; without wheezes/ rales/ or rhonchi. HEART:  Regular Rhythm; without murmurs/ rubs/ or gallops. ABDOMEN:  Soft & nontender; normal bowel sounds; no organomegaly or masses detected. EXT: without deformities, mild arthritic changes; no varicose veins/ venous insuffic/ or edema. Neuro:  intact w/o focal abn detected... DERM:  No lesions noted; no rash etc...    Impression & Recommendations:  Problem # 1:  TESTICULAR HYPOFUNCTION (ICD-257.2) His Testos level was 364 (350-890) & he wants to try TESTIM Gel daily as noted due to his poor quality of life... He declines to consider other modalities of rx at this time, and he declines Urology second opinion... He requests Rx for the TESTIM > start one 5gm tube applied daily for several weeks then go up to two 5gm  tubes... I've suggested Testos level in about 2 mo & he will call...  Problem # 2:  OTHER MEDICAL PROBLEMS AS NOTED>>> See recent CPX 06/08/10... OK PNEUMOVAX today...  Complete Medication List: 1)  Zyrtec Allergy 10 Mg Tabs (Cetirizine hcl) .... Take 1 tablet by mouth once a day 2)  Plavix 75 Mg Tabs (Clopidogrel bisulfate) .... Take 1 tablet by mouth once a day 3)  Coreg 12.5 Mg Tabs (Carvedilol) .... Take 1 tab by mouth two times a day... 4)  Amlodipine Besylate 5 Mg Tabs (Amlodipine besylate) .Marland Kitchen.. 1 by mouth once daily 5)  Lasix 40 Mg Tabs (Furosemide) .... As needed 6)  Klor-con 20 Meq Pack (Potassium chloride) .... Take 1 tablet by mouth once a day 7)  Lipitor 40 Mg Tabs (Atorvastatin calcium) .... Take 1 tab by mouth at bedtime 8)  Niaspan 500 Mg Tbcr (Niacin (antihyperlipidemic)) .... Take 1 tablet by mouth four times a day 9)  Fish Oil 1000 Mg Caps (Omega-3 fatty acids) .... 2 caps once daily 10)  Viagra 100 Mg Tabs (Sildenafil citrate) .... Take 1 tablet as needed 11)  Osteo Bi-flex Adv Triple St Tabs (Misc natural products) .... Take 1 tablet by mouth once a day 12)  Multivitamins Tabs (Multiple vitamin) .... Take 1 tablet by mouth once a day 13)  Wellbutrin Sr 150 Mg Tb12 (Bupropion hcl) .... Take one tablet by mouth two times a day 14)  Alprazolam 0.5 Mg Tabs (Alprazolam) .... Take 1 tablet by mouth three times a day as needed for nerves... 15)  Lac-hydrin 12 % Lotn (Ammonium lactate) .... Use as directed... 16)  Nitrostat 0.4 Mg Subl (Nitroglycerin) .Marland Kitchen.. 1 tablet under tongue at onset of chest pain; you may repeat every 5 minutes for up to 3 doses. 17)  Transderm-scop 1.5 Mg Pt72 (Scopolamine base) .Marland Kitchen.. 1 patch behind ear every 3 days... 18)  Testim 50 Mg/5gm Gel (Testosterone) .... Rub contents of two 5gm tubes into skin of arms/ shoulder daily...  Other Orders: Pneumococcal Vaccine (16109) Admin 1st Vaccine (60454)  Patient Instructions: 1)  Today we updated your med  list- see below.... 2)  Today we gave you the PNEUMONIA vaccine... (repeat vaccination due after age 6). 3)  We also wrote a new perscription for TESTIM Gel to start 5mg  tube rubbed into skin of arms/ shoulder daily> you may increase to 2 of the 5gm tubes after the first month... Prescriptions: TESTIM 50 MG/5GM GEL (TESTOSTERONE) rub contents of two 5gm tubes into skin of arms/ shoulder daily...  #60 x 6   Entered and Authorized by:   Michele Mcalpine MD   Signed by:   Michele Mcalpine MD on 06/18/2010   Method used:   Print then Give to Patient   RxID:   [id number] TESTIM 50 MG/5GM GEL (TESTOSTERONE) rub contents of one 5gm tube into skin of arms/ shoulder daily...  #30 x 6   Entered and Authorized by:   Michele Mcalpine MD   Signed by:   Michele Mcalpine MD on 06/18/2010   Method used:   Print then Give to Patient   RxID:   [id number]    Immunizations Administered:  Pneumonia Vaccine:    Vaccine Type: Pneumovax    Site: left deltoid    Mfr: Merck    Dose: 0.5 ml    Route: IM    Given by: Randell Loop CMA    Exp. Date: 09/11/2011    Lot #: [id number]  VIS given: 03/24/09 version given June 18, 2010.

## 2010-06-25 NOTE — Progress Notes (Signed)
Summary: lab results - OV scheduled to discuss  Phone Note Call from Patient Call back at Home Phone [phone]   Caller: Patient Call For: NADEL Summary of Call: Patient phoned stated that he would like to make an appointment with Dr Kriste Basque to discuss his labs results. The first available is not until March 21 and he would like to be seen sooner. Patient can be reached at [phone] Initial call taken by: Vedia Coffer,  June 15, 2010 2:30 PM  Follow-up for Phone Call        SN do you want me to add him on for a thursday afternoon?  please advise. thanks Randell Loop CMA  June 15, 2010 2:42 PM   Additional Follow-up for Phone Call Additional follow up Details #1::        per SN----we can add pt on for a thursday afternoon to discuss his lab results----or we can ask SN his questions and get these answered to save him a visit and the expense.  thanks Randell Loop CMA  June 15, 2010 3:38 PM     Additional Follow-up for Phone Call Additional follow up Details #2::    Called, spoke with pt.  He was informed ok per SN to either come in Thursday evening or we can ask SN questions for pt to save visit and expense.  Pt states he would rather just come in to discuss labs with SN.  OV scheduled for 06/18/10 at 3:30pm -- pt aware. Follow-up by: Gweneth Dimitri RN,  June 15, 2010 4:23 PM

## 2010-06-26 ENCOUNTER — Telehealth: Payer: Self-pay | Admitting: Pulmonary Disease

## 2010-06-30 NOTE — Progress Notes (Signed)
Summary: shots for travel  Phone Note Call from Patient Call back at Home Phone [phone]   Caller: Patient Call For: Halim Surrette Summary of Call: pt is traveling to [place] "middle of next month". wants to know what shots he needs to get.  Initial call taken by: Tivis Ringer, CNA,  June 26, 2010 11:41 AM  Follow-up for Phone Call        Pt states he will travel to [place] and staying there approx 2 weeks. Wants to know what if any vaccines he will need and where can he get them? Please advise. Thanks. Zackery Barefoot CMA  June 26, 2010 4:32 PM   Additional Follow-up for Phone Call Additional follow up Details #1::        per SN---pt will need to call the health dept  for this info---?the travel dept-----called and spoke with pt and he is aware of this---will call to find out what vaccines he will need for his trip----he stated that he needs tdap but will call later to get this  Randell Loop CMA  June 26, 2010 5:08 PM

## 2010-07-08 ENCOUNTER — Telehealth: Payer: Self-pay | Admitting: Pulmonary Disease

## 2010-07-08 NOTE — Telephone Encounter (Signed)
Spoke with pt.  He states that he spoke with [facility] about immunizations that he will need prior to going to [place].  He needs to have the following- Hep A and B, malaria, yellow fever, rabies, mmr, mcv4, typhoid, and tdap.  I advised that the only one we could give him here in the office would be tdap and he may need to contact [facility] about the others.  He states that he has already tried there and they advised him to call here.  Please advise thanks

## 2010-07-09 NOTE — Telephone Encounter (Signed)
Called and spoke with pt, he advised that he contacted the Health Dept and they were not able to help him. Pt states he then went to the [facility] website and saw that [facility] is the only approved clinic in this area to give yellow fever vaccine. Pt will have to pay "out of pocket" for any cost. I will forward message to SN for FYI.

## 2010-07-09 NOTE — Telephone Encounter (Signed)
Per SN---we have always used the health dept travel clinic for these vaccines due to our office not being able to provide all of the vaccinations.  He will need to call the health dept to let them know that our office does not offer these vaccines and that the health dept is where we send these pts that need the vaccines.  Thanks

## 2010-07-10 NOTE — Telephone Encounter (Signed)
We are sorry for the inconvience however we do not stock these vaccines in our clinic  We are aware  Thanks

## 2010-07-10 NOTE — Telephone Encounter (Signed)
Spoke with Larry Stone and advised Larry Stone we made dr. Kriste Basque aware of this and he verbalized understanding  Carver Fila, CMA

## 2010-07-20 ENCOUNTER — Other Ambulatory Visit: Payer: Self-pay | Admitting: Adult Health

## 2010-08-11 ENCOUNTER — Ambulatory Visit: Payer: 59 | Admitting: Internal Medicine

## 2010-08-19 ENCOUNTER — Encounter: Payer: Self-pay | Admitting: Internal Medicine

## 2010-08-24 ENCOUNTER — Ambulatory Visit: Payer: 59 | Admitting: Internal Medicine

## 2010-09-01 NOTE — Assessment & Plan Note (Signed)
[facility]                            CARDIOLOGY OFFICE NOTE   NAME:Mcmahen, LAKEN LOBATO                      MRN:          [id number]  DATE:01/03/2007                            DOB:          [birth year]    PRIMARY CARE PHYSICIAN:  Dr. Lonzo Cloud. Nadel.   INTERVAL HISTORY:  [name] is a very pleasant 60 year old male with a  history of coronary artery disease, status post acute lateral wall  infarction in September 2004, treated medically.  He also has a residual  50% left main stenosis which was confirmed by a cardiac CT and non-  obstructive disease elsewhere.   PAST MEDICAL HISTORY:  1. Also notable for hypertension.  2. Hyperlipidemia.  3. Probable metabolic syndrome.  4. Angioedema with an ACE inhibitor.  5. ASPIRIN allergy.   He returns today for routine followup.  He is doing quite well.  Unfortunately he has not been as active as he has before, as he had to  slow down his walking program due to left knee pain.  He denies any  chest pain or shortness of breath.  His blood pressure has been a little  bit labile.  Very occasional lower extremity edema.  No orthopnea or  PND.   CURRENT MEDICATIONS:  1. Multivitamin.  2. Metoprolol 50 mg twice daily.  3. Plavix 75 mg daily.  4. Zyrtec.  5. Co-enzyme Q10.  6. Fish oil 2000 mg daily.  7. Wellbutrin XL 300 mg daily.  8. Lasix 40 mg daily.  9. Potassium 20 daily.  10.Lipitor 40 mg daily.  11.Niaspan which was just increased to 2000 mg daily.   PHYSICAL EXAMINATION:  GENERAL:  He is well-appearing, in no acute  distress.  He ambulates around the clinic without any respiratory  difficulty.  VITAL SIGNS:  Blood pressure initially 130/90.  On manual recheck was  128/85.  Heart rate 61, weight 222 pounds.  HEENT:  Normal.  NECK:  Supple, no jugular venous distention.  Carotids 2+ bilaterally  without bruits.  There is no lymphadenopathy or thyromegaly.  HEART:  PMI is non-displaced.  He had  a regular rate and rhythm with no  murmurs, rubs or gallops.  LUNGS:  Clear.  ABDOMEN:  Obese, non-tender, non-distended.  No hepatosplenomegaly, no  bruits, no masses.  Good bowel sounds.  EXTREMITIES:  Warm with no clubbing, cyanosis or edema.  No rash.  NEUROLOGIC:  He is alert and oriented x3.  Cranial nerves II-XII  intact.  He moves all four extremities without difficulty.  Affect is  pleasant.   Electrocardiogram shows a normal sinus rhythm with an incomplete right  bundle branch block at a rate of 61.  There are inferior Q-waves and a  question of small lateral Q-waves, question of significance.  This is  unchanged from previous.   LABORATORY DATA:  Glucose 108.  Total cholesterol 115, triglycerides 61,  HDL 32, LDL 71.   ASSESSMENT/PLAN:  1. Coronary artery disease:  He is doing quite well without any      evidence of ischemia.  Continue the current therapy.  I have asked      him to increase his walking program.  2. Hyperlipidemia:  LDL is just at goal.  His Niaspan was recently      increased to 2 grams daily.  The HDL does not appear to be      responding.  Hopefully we can improve this with weight loss and      exercise.  3. Hypertension:  Blood pressure is mildly elevated.  We will switch      his Toprol over to Coreg 6.25 mg twice daily.  4. Glucose intolerance/metabolic syndrome:  Once again I reminded him      of the need for exercise and weight loss.   DISPOSITION:  I will see him back in about six months for followup.  I  will consider repeating a Myoview in the near future, given his left  main stenosis.     Bevelyn Buckles. Bensimhon, MD  Electronically Signed    DRB/MedQ  DD: 01/03/2007  DT: 01/03/2007  Job #: [id number]

## 2010-09-01 NOTE — Procedures (Signed)
[facility]                              EXERCISE TREADMILL   NAME:Larry Stone, Larry Stone                      MRN:          [id number]  DATE:11/30/2007                            DOB:          January 19, 1951    INDICATIONS:  Magic is a very pleasant 60 year old male with a history  of coronary artery disease, status post previous stenting.  Although, I  believe the right coronary artery, he had a 50% residual left main  stenosis, was brought in for a treadmill test to exclude significant  underlying ischemia.   DESCRIPTION OF THE TEST:  He walked for a little over 9 minutes on a  standard Bruce protocol (of note, we did cut the first stage short as he  was quite comfortable).  The test was stopped due to leg fatigue.  There  was no chest pain or shortness of breath.   Baseline EKG shows sinus rhythm with an RSR prime in V1 and there is  also mild ST-T wave scooping in V3.  Resting heart rate was 74.  Peak  heart rate was 130, which was about 80% of maximum predicted heart rate.  Blood pressure was 178/59 at peak.  There was no significant EKG changes  with exertion.   CONCLUSION:  1. Good exercise tolerance.  2. This is a negative stress test with no evidence of exercise-induced      ischemia.  3. Consider a followup in 1-2 years.     Bevelyn Buckles. Bensimhon, MD  Electronically Signed    DRB/MedQ  DD: 11/30/2007  DT: 12/01/2007  Job #: [id number]

## 2010-09-01 NOTE — Assessment & Plan Note (Signed)
[facility]                            CARDIOLOGY OFFICE NOTE   NAME:Larry Stone, Larry Stone                      MRN:          [id number]  DATE:05/26/2007                            DOB:          June 06, 1950    PRIMARY CARE PHYSICIAN:  Lonzo Cloud. Kriste Basque, MD   INTERVAL HISTORY:  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  Remainder of his medical history is a notable for hyperglycemia,  hyperlipidemia, hypertension, obstructive sleep apnea status post UPPP,  angioedema with ACE inhibitors and aspirin allergy.   Returns today for routine follow-up.  Unfortunately, his mother had a  large stroke at the end of last year and this has really cut into his  walking program.  He is now only walking twice a week, but he does 3-5  miles.  No chest pain, but does have occasional dyspnea.  Has not had  problems with heart failure.  Has noted that his blood pressure has been  elevated, running off in the 140s/100.   CURRENT MEDICATIONS:  1. Multivitamin.  2. Plavix 75 a day.  3. Zyrtec.  4. Fish oil 2000 a day.  5. Lasix 40 mg p.r.n.  6. Potassium 20 mEq a day.  7. Lipitor 40 a day.  8. Niaspan 500 four times a day.  9. Wellbutrin XL 150 mg b.i.d.  10.Coreg 6.25 b.i.d.  11.Viagra p.r.n.  12.Nitroglycerin p.r.n..   PHYSICAL EXAMINATION:  GENERAL:  He is in no acute distress, ambulates  around the clinic without respiratory difficulty.  VITAL SIGNS:  Blood pressure is 142/95, heart rates 78, weight 228 which  is up 10 pounds from previous.  HEENT:  Normal.  NECK:  Supple.  There is no JVD.  Carotid 2+ bilateral bruits.  There is  no lymphadenopathy or thyromegaly.  CARDIAC:  PMI is nondisplaced.  Regular rate and rhythm with no murmurs  or rubs,  soft S4.  LUNGS:  Clear.  ABDOMEN:  Obese, nontender, nondistended.  There is significant central  obesity.  No hepatosplenomegaly, no bruits, no masses.  Good bowel  sounds.  EXTREMITIES:  Warm.  No cyanosis, clubbing or edema.  No rash.  NEUROLOGICAL:  Alert and oriented x3.  Cranial nerves II-XII are intact.  Moves all four extremities without difficulty.  Affect is pleasant.  EKG  shows normal sinus rhythm at a rate of 78.  There is incomplete right  bundle branch block with a previous inferior lateral MI.  No acute ST-T  wave abnormalities.  Heart rate 78.   ASSESSMENT/PLAN:  1. Coronary disease.  He is doing well.  He does have some mild      exertional dyspnea, which I suspect is due to his weight, but given      his left main stenosis, I think he should go ahead and get a      treadmill Myoview to make sure he  is not ischemic.  2. Hypertension.  Blood pressure is elevated.  We will increase his      Coreg to 12.5 b.i.d.  3. Hyperlipidemia.  He is due for fasting lipids.  We will keep a long      statin and check his lipids.  4. Glucose intolerance/metabolic syndrome.  I reminded him that he      actually needs to lose weight and continue to try and be more      active.   DISPOSITION:  Will see him back in several months for routine follow-up.     Bevelyn Buckles. Bensimhon, MD  Electronically Signed    DRB/MedQ  DD: 05/26/2007  DT: 05/29/2007  Job #: [id number]

## 2010-09-04 NOTE — Assessment & Plan Note (Signed)
[facility]                           GASTROENTEROLOGY OFFICE NOTE   NAME:Larry [facility], Larry Stone                      MRN:          [id number]  DATE:12/22/2005                            DOB:          January 26, 1951    PROBLEM:  History of colon polyps.  The reason Larry Stone has returned to  schedule follow-up colonoscopy.  He has a history of colon polyps and was  last examined 2004 where a sessile small cecal polyp was removed.  Since  that time, he has had a MI.  He remains on Plavix.  He is quite fearful of  coming off Plavix because he feels that contributed to his last myocardial  infarction.  He has no GI complaints including abdominal pain or change in  bowel habits.   MEDICATIONS:  Include metoprolol, Plavix, Vytorin, Zyrtec, Naprosyn,  Niaspan, and Wellbutrin.   ALLERGIES:  He is allergic to ASPIRIN and ALTACE.   PHYSICAL EXAMINATION:  VITAL SIGNS:  Pulse 60.  Blood pressure 136/80.  Weight 221.  HEENT:  EOMI. PERRLA. Sclerae are anicteric.  Conjunctivae are pink.  NECK:  Supple without thyromegaly, adenopathy or carotid bruits.  CHEST:  Clear to auscultation and percussion without adventitious sounds.  CARDIAC:  Regular rhythm; normal S1 S2.  There are no murmurs, gallops or  rubs.  ABDOMEN:  Bowel sounds are normoactive.  Abdomen is soft, non-tender and non-  distended.  There are no abdominal masses, tenderness, splenic enlargement  or hepatomegaly.  EXTREMITIES:  Full range of motion.  No cyanosis, clubbing or edema.  RECTAL:  Deferred.   IMPRESSION:  1. History of colon polyps.  2. Coronary artery disease.  3. Hypertension.   RECOMMENDATIONS:  Follow-up colonoscopy.  Per the patient's request, I will  continue his Plavix.  The added higher risk for bleeding was discussed with  the patient and he wishes to proceed in this manner.  His Naprosyn will be  held.                                   Barbette Hair. Arlyce Dice, MD,FACG   RDK/MedQ  DD:  12/22/2005  DT:  12/22/2005  Job #:  [id number]   cc:   Larry Buckles. Bensimhon, MD

## 2010-09-04 NOTE — Discharge Summary (Signed)
NAME:  Larry Stone, Larry Stone                         ACCOUNT NO.:  [id number]   MEDICAL RECORD NO.:  [id number]                   PATIENT TYPE:  INP   LOCATION:  5009                                 FACILITY:  [facility]   PHYSICIAN:  Ricard Dillon, M.D. [facility]           DATE OF BIRTH:  1951-01-12   DATE OF ADMISSION:  04/20/2003  DATE OF DISCHARGE:  04/21/2003                                 DISCHARGE SUMMARY   ADMISSION DIAGNOSIS:  Anaphylactic reaction.   DISCHARGE DIAGNOSES:  1. Anaphylactic reaction.  2. Erythema multiforme.  3. Moderate respiratory distress.   HISTORY OF PRESENT ILLNESS:  The patient is a 60 year old white male with a  third case of erythema multiforme and anaphylactic reaction to an unknown  precipitant.  He presented in the emergency room with a diffuse rash  covering approximately 75% of his body, periorbital swelling, tongue  swelling, and moderate respiratory distress.  He was given epinephrine in  the emergency room due to a stated prednisone allergy and did not resolve.  He was admitted for observation and treatment.   HOSPITAL COURSE:  After discussion, he was begun on prednisone using  Decadron 10 mg IV q.12h. with excellent response.  The rash cleared in 24  hours.  His respiratory distress immediately resolved.  He was given Xanax  0.25 mg t.i.d. for his stated prednisone allergy, which was shaking.  He was  continued on his Lopressor and was given Lovenox subcutaneously for DVT  prophylaxis.   Twenty-four hours after admission, his rash had largely resolved, now being  present only on approximately 5% of the body.  The target lesions had  significantly faded.   The patient was in no distress and stated that he felt well.  He was  discharged to home in improved condition.   DISCHARGE DIAGNOSIS:  Nonurticarial erythema multiforme rash, probably a  food allergy versus a drug allergy.  The patient was given penicillin 24  hours prior to the rash appearing,  however, penicillin did not precipitate  the three prior rashes.   Therefore, he is instructed to avoid penicillin for the time being while  discussing with his primary care physician appropriate retesting for food  allergy.  He will be discharged on Allegra and prednisone taper.  Continue  his Xanax three times a day while on prednisone.  Resume all home  medications, including his Plavix.                                               Ricard Dillon, M.D. [facility]    JEJ/MEDQ  D:  04/21/2003  T:  04/21/2003  Job:  [id number]   cc:   Deborra Medina. Lenna Gilford, M.D. [facility]

## 2010-09-04 NOTE — Assessment & Plan Note (Signed)
[facility]                            CARDIOLOGY OFFICE NOTE   NAME:[facility], Larry Stone                      MRN:          [id number]  DATE:07/27/2006                            DOB:          29-Aug-1950    PRIMARY CARE PHYSICIAN:  Dr. Alroy Dust.   INTERVAL HISTORY:  Mr. Kiester is a very pleasant, 60 year old male with  a history of coronary artery disease status post acute lateral wall  myocardial infarction in September 2004 treated medically. 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 Medical history is also notable for  hypertension, hyperlipidemia and probable metabolic syndrome, angioedema  with ACE inhibitors and ASPIRIN allergy. For a complete problem list,  please see my note of January 25, 2006. He returns today for routine  followup. He is doing quite well, he is walking up to 3 miles a day, 5-6  days a week without any chest pain or shortness of breath. He actually  feels very good. He denies any orthopnea, PND or lower extremity edema.  He has been compliant with all his medications.   CURRENT MEDICATIONS:  1. Multivitamin.  2. Metoprolol 50 b.i.d.  3. Plavix 75.  4. Vytorin 10/40.  5. Zyrtec.  6. Naproxen 220 mg a day with his Niaspan.  7. Niaspan 1000 mg a day.  8. Fish oil.  9. Wellbutrin 300 a day.  10.Lasix 40 a day.  11.Potassium 20 a day.   PHYSICAL EXAMINATION:  GENERAL:  He is well-appearing in no acute  distress. He ambulates around the clinic without any respiratory  difficulty.  VITAL SIGNS:  Blood pressure is 120/80, heart rate is 55.  HEENT:  Sclera anicteric. EOMI. There is no xanthelasmas. Mucous  membranes are moist. Oropharynx clear.  NECK:  Supple. No JVD. Carotids are 2+ bilaterally without any bruits.  There is no lymphadenopathy or thyromegaly.  CARDIAC:  Regular rate and rhythm. No murmur, rubs or gallops.  LUNGS:  Clear.  ABDOMEN:  Obese, nontender, nondistended, no hepatosplenomegaly, no  bruits, no masses, good bowel sounds.  EXTREMITIES:  Warm with no cyanosis, clubbing or edema. No rash.  NEUROLOGIC:  Alert and oriented x3. Cranial nerves II-XII are intact.  Moves all 4 extremities without difficulty.   EKG shows sinus bradycardia at a rate of 55, a first degree AV block and  an incomplete right bundle branch block. There are small lateral Q waves  of questionable significance. No significant ST-T wave abnormalities.   LABORATORY DATA:  Glucose is 104, creatinine 1.1, BUN of 14. Total  cholesterol is 103, triglycerides 58, HDL 35, LDL 57.   ASSESSMENT/PLAN:  1. Coronary artery disease. He is doing quite well without any      evidence of ischemia. Will continue current therapy.  2. Hyperlipidemia. His LDL is at goal. We will increase his Niaspan to      1500 to try to get his HDL to 40. Given the recent date on Vytorin,      we have discussed it and decided to  switch him over to Lipitor 40.      We will check a repeat liver panel in 3 months.  3. Hypertension. Well controlled.  4. Glucose intolerance. This is stable. I have congratulated him on      his exercise program and encouraged him to continue attempts to      lose weight. Will need to watch his glucose closely as we titrate      his Niaspan.   DISPOSITION:  Will see him back in 6 months. We will check lipids and a  liver panel in 3 months.     Bevelyn Buckles. Bensimhon, MD  Electronically Signed    DRB/MedQ  DD: 07/27/2006  DT: 07/27/2006  Job #: [id number]   cc:   Lonzo Cloud. Kriste Basque, MD

## 2010-09-04 NOTE — Discharge Summary (Signed)
NAME:  Lar[facility], Larry Stone NO.:  [id number]   MEDICAL RECORD NO.:  [id number]                   PATIENT TYPE:  INP   LOCATION:  2010                                 FACILITY:  [facility]   PHYSICIAN:  Carole Binning, M.D. [facility]         DATE OF BIRTH:  1950-09-07   DATE OF ADMISSION:  01/08/2003  DATE OF DISCHARGE:  01/10/2003                                 DISCHARGE SUMMARY   BRIEF HISTORY:  This is a 60 year old male who recently had an MI while in  [place]. He was taken to [facility]. He  underwent emergent cardiac catheterization there, which revealed a total  occlusion of the circumflex marginal branch. There was also disease in the  ostium of the left main. It was not felt that intervention could be  performed on the obtuse marginal. The left main ostium lesion was felt to be  approximately 50%. The patient was taken back to his room, however, he  continued to have chest pain. He was taken back to the cardiovascular lab  where he had an ultrasound of the left main. Following that study it was  felt that the patient would require bypass surgery, however, he was  subsequently managed medically and stabilized over the next several days.  The family requested that he be transferred to [facility] for  further evaluation. He was admitted here on January 08, 2003. He had  actually undergone angioplasty on January 03, 2003 in [place].   PAST MEDICAL HISTORY:  1. Hypertension.  2. History of distal ischemia.  3. History of obstructive sleep apnea status post surgical repair.  4. History of mononucleosis associated with jaundice.  5. He has previously had a tonsillectomy, adenoidectomy, and sinus surgery.   ALLERGIES:  The patient is ALLERGIC TO CODEINE, which causes nausea; and  PREDNISONE, which causes jitters.   SOCIAL HISTORY:  The patient is married. He is a retired Product manager. He quit smoking one pack  per day and had done so for 25 years. He  uses alcohol occasionally.   FAMILY HISTORY:  His mother had an MI in her 36s. She is still living at age  55. His father died at age 23 from colon CA.   HOSPITAL COURSE:  As noted, this patient was admitted to [facility]  in transfer from [facility] in [facility] for further evaluation  of coronary artery disease. He was seen by Dr. Gerri Spore at the time of  admission. The patient was monitored closely over the first day or two and  then scheduled for an exercise Cardiolite to further evaluate his ischemia.   The patient underwent exercise Cardiolite testing on January 10, 2003.  Standard Bruce protocol was used with the patient's target heart rate about  142 beats per minutes. The patient was able to exercise for 6 minutes and 55  seconds. He reached a maximum heart  rate of 139. He had no chest pain during  the study. He did have fatigue and dyspnea. His EKG showed ST elevation in  leads 2, 3, and AVF as well as slight ST depression in v2. The images showed  a mid inferior lateral infarct with probable peri-infarct ischemia felt to  be mild in the inferior and inferior septal walls. There was also inferior  lateral hypokinesis with ejection fraction of 49%. This was discussed with  Dr. Gerri Spore who felt the patient was stable for discharge.   LABORATORY DATA:  His CBC on the 22nd revealed a hemoglobin 13.1, hematocrit  37.6, WBC 8100, platelets 194,000. Chemistry profile on the 22nd revealed  BUN 14, creatinine 1.1, potassium 3.9, glucose 97. There were no other labs  performed during his stay. Please see the results of the Cardiolite as noted  above.   DISCHARGE MEDICATIONS:  1. Lopressor 50 mg twice daily.  2. Zocor 40 mg daily.  3. Coated aspirin 325 mg daily.  4. Altace 2.5 mg daily.  5. Allegra or Claritin p.r.n.  6. Nitroglycerin p.r.n. for chest pain.  7. Nicotine patches as needed.  8. Lodine as previously  taken.  9. The patient's wife requested that the patient be placed on Plavix prior     to discharge. He was given a prescription for Plavix 75 mg daily. He was     told to discuss this with Dr. Gerri Spore at his next office visit. He was     told to take Tylenol as needed for pain.   DISCHARGE INSTRUCTIONS:  The patient was told to avoid any strenuous  activity until seen by Dr. Gerri Spore. He was referred to Cardiac Rehab.   He was told to quit smoking   DIET:  Low-salt, low-fat.   DISCHARGE FOLLOW UP:  He was told to follow up with Dr. Gerri Spore October 11  at 3:15 p.m., Dr. Kriste Basque as needed or as scheduled.   PROBLEM LIST AT THE TIME OF DISCHARGE:  1. Recent MI while at [facility] with cardiac catheterization performed     at [facility] Date of admission there January 02, 2003. The patient had a nondominant right coronary artery, a small     circumflex marginal that was totally occluded, and a 60% left main     stenosis with irregularities in the distal vessel. An intracoronary     ultrasound showed 5-5.6 mm stenosis in the left main.  2. Exercise Cardiolite performed this admission. Please see the results as     noted above.  3. Hypertension.  4. History of tobacco.  5. History of mononucleosis associated with jaundice.  6. Allergies to prednisone and Codeine.  7. Status post tonsillectomy and adenoidectomy as well as other sinus     surgery secondary to obstructive sleep apnea.  8. History of hyperlipidemia.      Delton See, P.A. [facility]                  Carole Binning, M.D. [facility]   DR/MEDQ  D:  01/10/2003  T:  01/10/2003  Job:  [id number]   cc:   Lonzo Cloud. Kriste Basque, M.D. [facility]

## 2010-09-04 NOTE — Assessment & Plan Note (Signed)
[facility]                              CARDIOLOGY OFFICE NOTE   NAME:Larry Stone, Larry Stone                      MRN:          [id number]  DATE:01/25/2006                            DOB:          08/26/1950    PRIMARY CARE PHYSICIAN:  Larry Stone, M.D.   PATIENT IDENTIFICATION:  Larry Stone is a very pleasant 60 year old male here  for routine followup.   PROBLEM LIST:  1. Coronary artery disease.      a.     Acute lateral wall myocardial infarction September 2004 treated       medically over concern of left main stenosis.      b.     Residual 50% left main stenosis. Adenosine Myoview July 2006:       EF 56%, no ischemia.      c.     Cardiac CT September 2006 showed less than 50% left main lesion,       nonobstructive disease in the right and left circumflex system but not       worse than 50%.  2. Hypertension.  3. Hyperlipidemia. Most recent cholesterol in February 2007:  Total      cholesterol 207, triglycerides 216, LDL 108, and HDL 56.  4. Glucose intolerance.  5. Obstructive sleep apnea status post UPP.  6. Aspirin allergy which causes a rash.  7. Angioedema with ACE inhibitors.  8. Obesity.  9. Anxiety.  10.History of tobacco use, 1 pack per day for 25 years, quit September      2004.  11.Osteoarthritis.   CURRENT MEDICATIONS:  1. Multivitamin.  2. ____________ 50 b.i.d.  3. Plavix 75.  4. Vytorin 10/40.  5. Zyrtec 10.  6. Coenzyme Q10.  7. Naprosyn 200 a day.  8. Niaspan 1000 a day.  9. Fish oil 2000 a day.  10.Wellbutrin XL 300 mg every other day.   ALLERGIES:  ASPIRIN, ALTACE AND AVAPRO.   INTERVAL HISTORY:  Larry Stone returns today for routine followup. He is  doing quite well. He is walking 3 miles a day without any limitation. He  denies any chest pain or shortness of breath. He has not had any heart  failure symptoms. He does have occasional lower extremity edema and took  some Demadex from a friend. This is very  rare. He denies any palpitations.   PHYSICAL EXAMINATION:  He is well appearing, no acute distress. Ambulates  around the clinic without any respiratory difficulty. Blood pressure is  116/78, heart rate 59. His weight is 218 which is stable.  Sclerae anicteric. EOMI. There is no xanthelasma. Mucous membranes are  moist.  NECK:  Is supple. No JVD. Carotids 2+ bilaterally without any bruits. There  is no lymphadenopathy or thyromegaly.  CARDIAC:  Bradycardic and regular. Soft S4. No murmur.  LUNGS:  Clear with mildly prolonged expiratory phase. No wheezes or rales.  ABDOMEN:  Mildly obese but nontender, nondistended. No hepatosplenomegaly.  No bruits. No masses. Good bowel sounds.  EXTREMITIES:  Warm with no cyanosis or clubbing. There is trace edema  bilaterally, strong distal pulses.  NEUROLOGICAL:  Alert and oriented x3. Cranial nerves II-XII are intact.  Moves all 4 extremities without any difficulty.   ASSESSMENT AND PLAN:  1. Coronary artery disease. He is doing quite well without any evidence of      angina. Continue medical therapy.  2. Hypertension. Well controlled.  3. Hyperlipidemia. LDL is no longer at goal. I am wondering whether he has      been noncompliant with his Vytorin. He denies this. We will recheck his      lipids today, and I suspect we may have to increase his Vytorin to      10/80. Continue niacin.  4. Lower extremity edema. We gave him a prescription for p.r.n. Lasix and      potassium.   DISPOSITION:  Return to clinic in 6 months.       Larry Buckles. Bensimhon, MD     DRB/MedQ  DD:  01/25/2006  DT:  01/27/2006  Job #:  [id number]   cc:   Larry Cloud. Kriste Basque, MD

## 2010-09-04 NOTE — H&P (Signed)
NAME:  Larry Stone, Larry Stone                         ACCOUNT NO.:  [id number]   MEDICAL RECORD NO.:  [id number]                   PATIENT TYPE:  INP   LOCATION:  2010                                 FACILITY:  [facility]   PHYSICIAN:  Carole Binning, M.D. [facility]         DATE OF BIRTH:  06-25-1950   DATE OF ADMISSION:  01/08/2003  DATE OF DISCHARGE:                                HISTORY & PHYSICAL   CHIEF COMPLAINT:  I had a heart attack last week.   HISTORY OF PRESENT ILLNESS:  The patient is a 60 year old male with  cardiovascular risk factors of hypertension, hyperlipidemia, and tobacco  abuse. He had no prior cardiac history until one week ago. On September 13th  the patient was visiting the beach and had an episode of chest pain after  walking which was severe for one to two minutes and then had a mild residual  pain lasting approximately 30 minutes. The following day he had a similar  episode of chest pain again after walking which this time radiated into both  elbows. Again, it resolved within 30 minutes. On the 15th, he had a  recurrent episode of chest pain. This occurred while he was at rest. He went  and laid down, fell asleep, and when he awoke his pain was gone.  However,  shortly after waking up he had recurrent chest pain which persisted. He  ultimately presented to a local urgent care center where EKG did not show  any acute changes. However, because of ongoing chest pain he was transferred  emergently to [facility].  Again, the EKG was not  diagnostic, but he was having ongoing chest pain and clinically appeared to  be having a myocardial infarction.  He underwent emergent cardiac  catheterization.  This revealed a total occlusion of a circumflex marginal  branch.  However, there was disease in the ostium of the left main coronary  artery. Apparently the physician performing the procedure felt that the  disease in the ostium of the left main was  significant enough that it would  preclude attempting intervention of the obtuse marginal branch. By  angiography, the left main ostium was felt to be approximately 50% narrowed.  The patient was therefore taken back to his room, but had persistent chest  pain and therefore several hours later by his report was taken back to the  cardiac catheterization laboratory where intravascular ultrasound of the  left main coronary was performed. By their description, they either showed  the area in the left main to be approximately 5.6 mm2.  The operator felt  that this significant and warranted bypass surgery.   The patient was subsequently managed medically and stabilized over the next  few days. However, the family reports that she did have recurrent episodes  of chest pain up until last Saturday, which was the 18th.  Since then, over  the past three days, he has  not had any recurrent chest pain. He has been  fully ambulatory without any problems of chest pain or dyspnea. Because the  patient lives in [place] he then requested transfer to [facility]. He now arrives for further management. Of note, prior to transfer,  the attending cardiologist called and spoke with Dr. Tenny Craw this morning. He  felt that he was not convinced the stenosis in the ostium of the left main  coronary artery was significant and that further review is warranted.   The patient's cardiac risk factors are as outlined above. He denies history  of diabetes or family history of premature coronary artery disease.   PAST MEDICAL HISTORY:  1. Hypertension.  2. Mild dyslipidemia.  3. History of obstructive sleep apnea, status post surgical repair.  4. History of mononucleosis associated with jaundice.   PREVIOUS SURGERIES:  Tonsillectomy, adenoidectomy, and sinus surgery.    MEDICATIONS ON TRANSFER:  1. Aspirin 325 mg daily  2. Pl avis 75 mg daily  3. Lovenox 93 mg subcu q.12h.  4. Pepcid 20 mg daily.  5.  Imdur 30 mg daily.  6. Metoprolol 50 mg b.i.d.  7. Fish oil capsules 1000 mg two tablets a day.  8. Niacin ER 500 mg daily.  9. Transdermal nicotine patch daily.  10.      Zocor 20 mg daily.   ALLERGIES:  The patient states he is allergic to CODEINE which causes nausea  and PREDNISONE which causes jitters.   SOCIAL HISTORY:  The patient is married. He is retired from [employer]  work.   HABITS:  Tobacco--one pack per day times 25 years. Alcohol--occasional.   FAMILY HISTORY:  Mother experienced an MI in her 15s. She is still alive at  age 43.  His father died at age 49 of colon cancer. There is no family  history of premature coronary artery disease.   REVIEW OF SYMPTOMS:  The patient denies any fevers or chills. He has  occasional congestion, which is relieved with antihistamines. He has a  history of erectile dysfunction for which he is taking Viagra. He has also  had a history of heartburn. Otherwise, review of systems negative except as  documented above.   PHYSICAL EXAMINATION:  GENERAL:  In general, this is a well-appearing male,  in no acute distress.  VITAL SIGNS: Temperature 98.6, pulse 74 and regular, blood pressure 134/66.  SKIN: Warm and dry without generalized rash.  HEENT: Normocephalic and atraumatic.  Sclera anicteric.  Oral mucosa  unremarkable.  NECK: No adenopathy or thyromegaly. No JVD.  Carotid upstroke normal without  bruits.  CHEST: Clear to auscultation and percussion.  CARDIAC: PMI nondisplaced; regular, rate, and rhythm; positive S4. Normal S1  and S2 without rub, murmur, or S3.  ABDOMEN: Soft and nontender without organomegaly. Normal bowel sounds. No  bruits.  EXTREMITIES: No clubbing, cyanosis, or edema.  Cath site in the right groin  shows mild ecchymosis without hematoma or bruits. Peripheral pulses are 2+  throughout.   EKG from January 03, 2003, shows sinus rhythm with Q waves in the inferior leads as well as prominent R waves in leads  V1 and V2 consistent with  possible anteroposterior infarct.   ASSESSMENT:  The patient is a 60 year old male who presents after, what  appears to be by angiography, a lateral wall myocardial infarction secondary  to occlusion of an obtuse marginal branch. 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 In  addition, he has risk factors as outlined above including hypertension,  tobacco abuse, and dyslipidemia.   PLAN:  The patient is admitted to a telemetry bed.  Will attempt to obtain  copies of the actual intravascular ultrasound tapes for further review. I  will review the tapes and the scintiangiography with my colleagues to  determine further treatment, i.e., surgical treatment versus medical versus  percutaneous.   The patient will be treated with beta blocker for his hypertension and an  ACE inhibitor as tolerated. We will also continue statin therapy.  In  addition, he needs smoking cessation counseling and assistance.                                                Carole Binning, M.D. [facility]    MWP/MEDQ  D:  01/08/2003  T:  01/09/2003  Job:  [id number]   cc:   Lonzo Cloud. Kriste Basque, M.D. [facility]

## 2010-09-04 NOTE — Op Note (Signed)
NAME:  Larry Stone, Larry Stone                         ACCOUNT NO.:  [id number]   MEDICAL RECORD NO.:  [id number]                   PATIENT TYPE:  OIB   LOCATION:  2550                                 FACILITY:  [facility]   PHYSICIAN:  Veverly Fells. Arletha Grippe, M.D.             DATE OF BIRTH:  12-18-1950   DATE OF PROCEDURE:  06/06/2002  DATE OF DISCHARGE:                                 OPERATIVE REPORT   PREOPERATIVE DIAGNOSES:  1. Obstructive sleep apnea.  2. Nasal airway obstruction.  3. Nasal septal deviation.  4. Inferior turbinate hypertrophy.  5. Tonsil and palatal hypertrophy.   POSTOPERATIVE DIAGNOSES:  1. Obstructive sleep apnea.  2. Nasal airway obstruction.  3. Nasal septal deviation.  4. Inferior turbinate hypertrophy.  5. Tonsil and palatal hypertrophy.   PROCEDURES:  1. Tonsillectomy and uvulopalatopharyngoplasty, both with Harmonic scalpel.  2. Nasal septal reconstruction.  3. Intramural cauterization of both inferior turbinates.   SURGEON:  Veverly Fells. Arletha Grippe, M.D.   ANESTHESIA:  General endotracheal anesthesia.   FLUIDS REPLACED:  Approximately 1 L of crystalloid.   ESTIMATED BLOOD LOSS:  Less than 30 mL.   URINE OUTPUT:  Not measured.   DRAINS:  There were no drains, no packs, and two Doyle splints were placed.   SPECIMENS:  Septal cartilage and bone for gross pathology only, and tonsils  x2 and uvula and portion of the soft palate.   INDICATION FOR PROCEDURE:  This is a 60 year old white male who has a  history of borderline hypertension and a history of daytime somnolence and  morning fatigue with a diagnosis of obstructive sleep apnea based on a sleep  test obtained on 10/15/98.  This did show an RDI of about 16 events per hour,  desaturations down to 93%.  He has tried nasal CPAP for about the last two  years but has been very unsuccessful in using this type of treatment for his  obstructive sleep apnea.  Physical examination in the office showed a  moderate  to severe septal deviation with right inferior turbinate congestion  and hypertrophy, elongated soft palate and uvula, and moderate tonsillar  hypertrophy.  Based on his history and physical examination, I have  recommended proceeding with the above-noted surgical procedure.  I have  discussed extensively with him and his family the risks and benefits of  surgery, including the risks of general anesthesia, infection, bleeding, the  need for septal splinting, the possibility of velopharyngeal insufficiency,  and the normal recovery period expected after this type of surgery.  I have  entertained any questions, answered them appropriately, and informed consent  has been obtained and the patient presents for the above-noted procedure.   OPERATIVE FINDINGS:  Severe septal deviation and right inferior turbinate  congestion and hypertrophy.  Elongated soft palate and uvula.  Moderate  tonsillar hypertrophy.   DESCRIPTION OF PROCEDURE:  The patient was brought in the operating room and  placed in the supine position, and general endotracheal anesthesia  administered via the anesthesiologist without complications.  The patient  was administered 1 g of Ancef IV x1 and 10 mg of Decadron IV x1.  The head  of the table was turned 90 degrees.  The patient's face was draped in  standard fashion.  The Crowe-Davis mouth  retractor was inserted into the  oral cavity.  This was used to retract the mouth open.  First attention was  turned to the right tonsil, a curved Allis clamp used to grasp the tonsil  and retract it medially.  The Harmonic scalpel was then used to dissect the  tonsil free from the tonsillar fossa.  Bleeding was controlled with a  combination of Harmonic scalpel and suction cautery without difficulty.  The  tonsil was then transected at the tongue base and removed through the oral  cavity without incident.  Next attention was turned to the left tonsil.  An  identical procedure was  carried out on this side compared to the right side  with identical results.  After this was done, both tonsillar fossae were  then irrigated with copious amounts of irrigation fluid, suctioned dry.  Bleeding was controlled meticulously with suction cautery without  difficulty.  The uvula and a portion of the soft palate was then transected  horizontally with a Harmonic scalpel in such a way to leave enough soft  palate behind to prevent any velopharyngeal insufficiency.  The dissection  was carried out in such a way also to create a posteriorly-based trap door  flap for closure.  The uvula and portion of the soft palate was then  removed, sent to surgical pathology for permanent section analysis.  Bleeding from the area was controlled with electrocautery without  difficulty.  Both tonsillar fossae and the palatal area were then irrigated  with copious amounts of irrigation fluid and suctioned dry.  There was no  evidence of any active bleeding.  The cut edges of the palate and anterior  and posterior tonsillar pillars were reapproximated with multiple  interrupted 3-0 Vicryl suture.  A total of 4 mL of 0.5% Marcaine solution  with 1:200,000 epinephrine infiltrated into the anterior tonsillar pillar  and tongue base bilaterally.  The Crowe-Davis mouth retractor was released  and brought out through the oral cavity without incident.  The head of the  table was elevated 30 degrees.  Next attention was turned to the nasal  chamber.  Cotton pledgets soaked in a 4% cocaine solution were placed in  both nares, left in place for approximately five to 10 minutes, and then  removed.  Both sides of the septum were infiltrated with a 1% lidocaine  solution with 1:100,000 of epinephrine.  After this was done and waiting  approximately 10 minutes, a standard Killian incision was made of the left  side of the septum.  A mucoperichondrial and mucoperiosteal flap was elevated on the left side using both  blunt and sharp dissection.  An  intercartilaginous incision was made approximately 1 cm posterior from the  caudal limb of the septum.  A mucoperichondrial and mucoperiosteal flap was  elevated on the right side using both blunt and sharp dissection.  Cartilaginous deviation was removed with a Ballenger and Silver knife.  Posterior bony deflection was removed with the Jansen-Middleton forceps, and  the same Jansen-Middleton forceps was then used to remove the septal spur,  which was pushing off to the right nasal chamber.  A piece of  trimmed  morcellized cartilage was placed in between the septal flaps.  The septal  incision was closed with interrupted chromic suture, and the septum was  reinforced with a running transseptal plain gut mattress stitch.  Both  inferior turbinates were injected with a total of 6 mL of 1% lidocaine  solution with 1:100,000 epinephrine.  Both inferior turbinates were then  intramurally cauterized using the Elmed intramural cauterization unit set at  a 12 watt setting.  Three passes of both inferior turbinates were performed  without difficulty, and both inferior turbinates were then outfractured  using gentle lateral pressure with a large nasal speculum.  Doyle splints  soaked in the Bactroban ointment solution were placed on either side of the  septum and held in place with a trans-septal Prolene suture.  An orogastric  tube was placed.  This was used to used decompress the stomach contents.  It  was then removed without incident.  The patient tolerated the procedure well  without complications, was extubated in the operating room, and transferred  to recovery in stable condition.  Sponge, instrument, and needle counts were  correct at end of procedure.  Total duration of the procedure was  approximately two hours.  The patient will be admitted for overnight  recovery and to monitor the situation.  Once he has recovered well, he will  be sent home on 06/07/02.   He will be sent home on Augmentin elixir 400 mg  p.o. t.i.d. for 10 days, Vioxx 50 mg #10 with no refills one tablet p.o.  daily for 10 days, and Lortab Elixir 250 mL with two refills 10-15 mL p.o.  q.4h. p.r.n. pain.  He is to have light activity, no heavy lifting or nose  blowing for two weeks after surgery, and a post-tonsillectomy diet for two  weeks after surgery.  Both he and his family were given oral and written  instructions.  They are to call with any problems with bleeding, fever,  vomiting, pain, reaction to medications, or any other questions.  He will  follow up in the office for splint removal on Thursday, 06/14/02, at 3:50  p.m.                                               Veverly Fells. Arletha Grippe, M.D.    MDR/MEDQ  D:  06/06/2002  T:  06/06/2002  Job:  [id number]   cc:   Marcelyn Bruins, M.D. [facility]

## 2010-09-17 ENCOUNTER — Ambulatory Visit (INDEPENDENT_AMBULATORY_CARE_PROVIDER_SITE_OTHER): Payer: 59 | Admitting: Internal Medicine

## 2010-09-17 ENCOUNTER — Encounter: Payer: Self-pay | Admitting: Internal Medicine

## 2010-09-17 VITALS — BP 108/80 | HR 64 | Resp 18 | Ht 70.0 in | Wt 218.1 lb

## 2010-09-17 DIAGNOSIS — I10 Essential (primary) hypertension: Secondary | ICD-10-CM

## 2010-09-17 DIAGNOSIS — I2510 Atherosclerotic heart disease of native coronary artery without angina pectoris: Secondary | ICD-10-CM | POA: Insufficient documentation

## 2010-09-17 DIAGNOSIS — E785 Hyperlipidemia, unspecified: Secondary | ICD-10-CM

## 2010-09-17 NOTE — Patient Instructions (Signed)
Your physician wants you to follow-up in: 9 months You will receive a reminder letter in the mail two months in advance. If you don't receive a letter, please call our office to schedule the follow-up appointment.  

## 2010-09-17 NOTE — Assessment & Plan Note (Signed)
LDL at goal. Continue current regimen.  

## 2010-09-17 NOTE — Progress Notes (Signed)
HPI:  Larry Stone is a very pleasant 60 year old male with a history of coronary artery disease status post acute inferior lateral wall infarction in September 2004 treated medically.  He also has residual 50% left main stenosis and nonobstructive disease elsewhere.   Had cardiac CT in 05/2009. LM < 50% LAD 50% LCX dominant nonobs RCA small no critical lesions. Small lung nodule. F/u CT showed stable pulm nodule.  Carotid u/s 11/11. Minimal carotid plaquing.   Remainder of his medical history is a notable for hyperglycemia, hyperlipidemia, hypertension, COPD, obstructive sleep apnea status post UPPP, angioedema with ACE inhibitors and aspirin allergy.  Returns for routine f/u. Doing very well. [place]4 [place] and [place] [employer].  No CP or SOB. BP well controlled.   Lipids 11/11: TC 114 TG 52 HDL 39 LDL 64   ROS: All systems negative except as listed in HPI, PMH and Problem List.  Past Medical History  Diagnosis Date  . Arteriosclerotic heart disease (ASHD)     --8/09: ETT normal --acute inferior lateral wall infarction in September 2004 treated medically.  --cardiac CT 2006  residual 50% left main stenosis and nonobstructive disease elsewhere. EF normal   --cardiac CT 05/2009. LM. <50% LAD. 50%  . HTN (hypertension)   . Hyperlipidemia   . Pharyngitis   . URI (upper respiratory infection)   . OSA (obstructive sleep apnea)   . Recurrent aspiration bronchitis/pneumonia   . Diabetes mellitus   . GERD (gastroesophageal reflux disease)   . Diverticulosis   . Colonic polyp   . DJD (degenerative joint disease)   . Low back pain syndrome   . Anxiety     Current Outpatient Prescriptions  Medication Sig Dispense Refill  . ALPRAZolam (XANAX) 0.5 MG tablet Take 0.5 mg by mouth 3 (three) times daily as needed. for nerves.       Marland Kitchen amLODipine (NORVASC) 5 MG tablet TAKE 1 TABLET EVERY DAY  30 tablet  4  . ammonium lactate (LAC-HYDRIN) 12 % lotion Apply topically as directed.        Marland Kitchen  atorvastatin (LIPITOR) 40 MG tablet Take 40 mg by mouth at bedtime.        Marland Kitchen buPROPion (WELLBUTRIN SR) 150 MG 12 hr tablet Take 150 mg by mouth 2 (two) times daily.        . carvedilol (COREG) 12.5 MG tablet Take 12.5 mg by mouth 2 (two) times daily.        . cetirizine (ZYRTEC) 10 MG tablet Take 10 mg by mouth daily.        . clopidogrel (PLAVIX) 75 MG tablet Take 75 mg by mouth daily.        . fish oil-omega-3 fatty acids 1000 MG capsule Take 2 g by mouth daily.        . furosemide (LASIX) 40 MG tablet Take 40 mg by mouth as needed.        . Misc Natural Products (OSTEO BI-FLEX ADV TRIPLE ST) TABS Take 1 tablet by mouth daily.        . Multiple Vitamin (MULTIVITAMIN) tablet Take 1 tablet by mouth daily.        . niacin (NIASPAN) 500 MG CR tablet Take 500 mg by mouth 4 (four) times daily.        . nitroGLYCERIN (NITROSTAT) 0.4 MG SL tablet Place 0.4 mg under the tongue every 5 (five) minutes as needed. For up to 3 doses.       . potassium chloride (KLOR-CON)  20 MEQ packet Take 20 mEq by mouth daily.        Marland Kitchen scopolamine (TRANSDERM-SCOP) 1.5 MG Place 1 patch onto the skin every 3 (three) days. Place behind ear.       . sildenafil (VIAGRA) 100 MG tablet Take 100 mg by mouth as needed.        . testosterone (TESTIM) 50 MG/5GM GEL Rub contents of two 5gm tubes into skin of arms/shoulder daily...          PHYSICAL EXAM: Filed Vitals:   09/17/10 1139  BP: 108/80  Pulse: 64  Resp: 18   General:  Well appearing. No resp difficulty HEENT: normal Neck: supple. JVP flat. Carotids 2+ bilaterally; no bruits. No lymphadenopathy or thryomegaly appreciated. Cor: PMI normal. Regular rate & rhythm. No rubs, gallops or murmurs. Lungs: clear Abdomen: soft, nontender, nondistended. No hepatosplenomegaly. No bruits or masses. Good bowel sounds. Extremities: no cyanosis, clubbing, rash, edema Neuro: alert & orientedx3, cranial nerves grossly intact. Moves all 4 extremities w/o difficulty. Affect  pleasant.   ECG:    ASSESSMENT & PLAN:

## 2010-09-17 NOTE — Assessment & Plan Note (Signed)
Doing well. No evidence of ischemia. Continue current regimen.   

## 2010-09-17 NOTE — Assessment & Plan Note (Signed)
Blood pressure well controlled. Continue current regimen.  

## 2010-12-02 ENCOUNTER — Other Ambulatory Visit: Payer: Self-pay | Admitting: Adult Health

## 2010-12-02 ENCOUNTER — Other Ambulatory Visit: Payer: Self-pay | Admitting: Pulmonary Disease

## 2010-12-08 ENCOUNTER — Ambulatory Visit (INDEPENDENT_AMBULATORY_CARE_PROVIDER_SITE_OTHER): Payer: 59 | Admitting: Pulmonary Disease

## 2010-12-08 ENCOUNTER — Encounter: Payer: Self-pay | Admitting: Pulmonary Disease

## 2010-12-08 DIAGNOSIS — D126 Benign neoplasm of colon, unspecified: Secondary | ICD-10-CM

## 2010-12-08 DIAGNOSIS — M1990 Unspecified osteoarthritis, unspecified site: Secondary | ICD-10-CM

## 2010-12-08 DIAGNOSIS — G4733 Obstructive sleep apnea (adult) (pediatric): Secondary | ICD-10-CM

## 2010-12-08 DIAGNOSIS — R7309 Other abnormal glucose: Secondary | ICD-10-CM

## 2010-12-08 DIAGNOSIS — F411 Generalized anxiety disorder: Secondary | ICD-10-CM

## 2010-12-08 DIAGNOSIS — K219 Gastro-esophageal reflux disease without esophagitis: Secondary | ICD-10-CM

## 2010-12-08 DIAGNOSIS — M545 Low back pain: Secondary | ICD-10-CM

## 2010-12-08 DIAGNOSIS — I10 Essential (primary) hypertension: Secondary | ICD-10-CM

## 2010-12-08 DIAGNOSIS — J42 Unspecified chronic bronchitis: Secondary | ICD-10-CM

## 2010-12-08 DIAGNOSIS — E785 Hyperlipidemia, unspecified: Secondary | ICD-10-CM

## 2010-12-08 DIAGNOSIS — K5730 Diverticulosis of large intestine without perforation or abscess without bleeding: Secondary | ICD-10-CM

## 2010-12-08 DIAGNOSIS — I2510 Atherosclerotic heart disease of native coronary artery without angina pectoris: Secondary | ICD-10-CM

## 2010-12-08 NOTE — Patient Instructions (Signed)
Today we updated your med list in EPIC...    Continue your current meds the same...  Call for any problems...  Let's plan a follow up visit in 6 months w/ CXR & FASTING blood work at that time.Marland KitchenMarland Kitchen

## 2010-12-08 NOTE — Progress Notes (Signed)
Subjective:    Patient ID: Larry Stone, male    DOB: 1950-09-02, 60 y.o.   MRN: [id number]  HPI 60 y/o WM here for a followup visit... he has multiple medical problems as noted below...  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  ~  July 21, 2009:  he never returned for fasting labs after his CPX 7/10... saw DrBensimhon 2/11 w/ f/u Cardiac CT showing <50% Lmain stenosis, 50% midLAD, additional nonobstructive dis; incidental 5mm RML nodule seen + mild fatty liver & ?mild thickening of esoph... we discussed the 5mm nodule & rec f/u CT in 79mo to check for stability... his BP remains under good control;  Chol numbers look fine  on his meds;  he has done a fab job w/ weight reduction down 16# to 209# today...  ~  June 08, 2010:  70mo ROV- recent URI w/ ZPak & Mucinex called in & feeling better overall;  requests written Rx for mult OTC meds so he can use hid "flex acct"...    He saw DrBensimhon 10/11 for f/u CAD & doing satis- no changes made;  he had Vasc Screen w/ mild carotid dis "some plaque", & norm AbdAo & ABIs;  also had f/u CT Chest for f/u tiny 3mm nodule RML- unchanged xyrs & benign, mild centilob emphysema, +coronary calcif, NAD...    He had FLP 11/11- looked good on his regimen of Lip40, NiaspanQid, FishOil;  BS remains stable on diet alone;  colonoscopy due 10/12 per GI;  remains stable on Wellbutrin & Alpraz (he wishes to continue same).  ~  June 18, 2010:  Add-on at pt request for mult questions> he wants me to know that he just feels terrible & that his "quality of life is going down over the last yr";  he notes that he exercises at the gym regularly & the trainer can't understand why he isn't gaining muscle, and he can't get rid of his gut despite watching his diet, & he notes sexual dysfunction, etc... he has checked everything on the internet & he wants to try TESTIM Gel  for 79mo to see if this makes a diff for him;  recent labs w/ Testos level = 364 ([phone]) & so we will go along w/ his desire for this trial> offered consideration of other meds, and offered Urology referral for second opinion & poss Testos shots but he declines & wants to try what he has researched>  we wrote Rx for TESTIM Gel 5gm tubes- start w/ one tube rubbed into skin of arms/ shoulders dailt for several weeks & incr to 2 tubes thereafter (he says his insurance will pay for up to 3 tubes per day)... he is rec to ret for Testos level in ~48months (he will call when ready).  ~  December 08, 2010:  79mo ROV & he tells me he is feeling great "couldn't be better"; he's been traveling, had "every vaccination known to man" at travel clinic but didn't bring card or list for [place] to review & place on his chart; notes that he is still on the TESTIM 2 of the 5gm tubes rubbed into skin daily but he oddly does not credit this intervention w/ his new found well-being etc;  He never returned for f/u Testosterone level on the gel & currently feels well enough that he doesn't care what the level is> he's going to  continue current med RX no matter what...    He saw DrBensimhon for Cards f/u 5/12 w/ CAD- s/p IWMI 2004 w/ resid 50% Lmain & nonobstructive dis elsewhere> he had Cardiac CT 2/11 w/ LM<50%, LAD50%, nonobstructive dis elsewhere;  NOTE small lung nodule, f/u CT stable, no change;  Doing well, exercising, no CP, palpit, SOB, etc; no changes made...    We reviewed meds, prev labs & XRays...   Current Problem List:     ALLERGIC to ASA w/ hives, & ACE inhibitors/ ARBs too w/ angioedema.  OBSTRUCTIVE SLEEP APNEA (ICD-327.23) - s/p UPPP surgery in 2004 by DrRedman...  states he's doing OK, denies snoring, no daytime hypersom, wife not complaining... uses ZYRTEK for allergy symtoms.  Hx of BRONCHITIS, RECURRENT (ICD-491.9) - he is an ex-smoker, prev 1 ppd for 9yrs, quit in 2004. ~  Hx tiny RML nodule seen on Cardiac CT  2/11 & apparently unchanged from old CTChest 2004... ~  f/u Chest CT 10/11 w/ 3mm RML nodule, no change & benign...  HYPERTENSION (ICD-401.9) - on COREG 12.5mg Bid, AMLODIPINE 5mg /d, & LASIX 40mg - only as needed, + KCl daily ("DrBensimhon said to take it every day")... BP= 118/84, taking meds regularly & tol well... incr stress w/ mother's stroke and he's the care giver... denies HA, visual changes, CP, palipit, dizziness, syncope, dyspnea, edema, etc...  ARTERIOSCLEROTIC HEART DISEASE (ICD-414.00) - on PLAVIX 75mg /d, allergic to ASA... followed by DrBensimhon and seen regularly...  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 ~  f/u Cardiac CT 2/11 showed>  IMPRESSION: Calcified plaque in the left main with < 50% stenosis.  Calcified plaque in the proximal and mid LAD with at most moderate (around 50%) stenosis in the mid LAD.  The CFX is dominant, supplying the left PDA.  The AV CFX appears patent with mild stenosis at most. There are 2 moderate, closely spaced obtuse marginal vessels that have calcified plaque but are patent.  They are not seen well enough to comment on degree of stenosis within the vessels.  The RCA is small and nondominant. ~  Vasc Screen per Cards 10/11 showed mild carotid plaques, norm AbdAo & ABIs...  HYPERLIPIDEMIA (ICD-272.4) - on LIPITOR 40mg /d, NIASPAN 500Qid, FISH OIL daily... ~  FLP3/09 showed TChol 117, TG 46, HDL 38, LDL 70 ~  FLP 7/10 > pt never ret for fasting labs after this visit. ~  FLP 2/11 showed TChol 123, TG 56, HDL 46, LDL 66 ~  FLP 11/11 showed TChol 114, TG 52, HDL 39, LDL 64  DIABETES MELLITUS, BORDERLINE (ICD-790.29) - on diet alone... ~  labs 3/09 (wt=225#) showed BS= 114, HgA1c= 6.2 ~  labs 7/10 (wt=229#) > pt never ret for fasting labs after this ov. ~  labs 2/11 (wt=206#) showed BS= 93, A1c= 5.9 ~  labs 2/12 (wt=214#) showed BS= 84, A1c= 6.1  GERD (ICD-530.81) - he uses H2 blockers as  needed.  DIVERTICULOSIS OF COLON (ICD-562.10) & COLONIC POLYPS (ICD-211.3) - his last polyps were ~59mm and removed in 2004= adenomatous... last colonoscopy 10/07 showed divertics only... f/u planned 89yrs... there is a +fam hx of colon cancer in his father who died at age 31... (he tells me he would prefer to have colonoscopies every 29yrs). ~  f/u colon due 10/12...  DEGENERATIVE JOINT DISEASE (ICD-715.90) - he reports doing fair- mostly c/o knees & hands ("I messed up my knee in a fall")... he saw Ortho ?who? on Glucosamine/ Chondroitin  supplements.  LOW BACK PAIN SYNDROME (ICD-724.2)  ANXIETY (ICD-300.00) - on WELLBUTRIN 150mg Bid, & ALPRAZOLAM 0.5mg  Prn... increased stress w/ mother's stroke last year... he's the main care giver w/ 2 sisters who don't help much...   Health Maintenance: ~  GI:  due for f/u colonoscopy 10/12... ~  GU:  DRE is neg, PSA=0.43, Testos= 364 (350-890), on Viagra Prn... ~  Immuniztions:  he is encouraged to get the yearly Flu vaccine... given PNEUMOVAX 2/12... He reports a lot of travel & he's been to the travel clinic & had "every vaccine known to man"...   Past Surgical History  Procedure Date  . Uvulopalatopharyngoplasty     surgery for OSA    Outpatient Encounter Prescriptions as of 12/08/2010  Medication Sig Dispense Refill  . ALPRAZolam (XANAX) 0.5 MG tablet Take 0.5 mg by mouth 3 (three) times daily as needed. for nerves.       Marland Kitchen amLODipine (NORVASC) 5 MG tablet TAKE 1 TABLET EVERY DAY  30 tablet  4  . ammonium lactate (LAC-HYDRIN) 12 % lotion Apply topically as directed.        Marland Kitchen atorvastatin (LIPITOR) 40 MG tablet Take 40 mg by mouth at bedtime.        Marland Kitchen buPROPion (WELLBUTRIN SR) 150 MG 12 hr tablet TAKE 1 TABLET BY MOUTH TWICE A DAY  60 tablet  5  . carvedilol (COREG) 12.5 MG tablet Take 12.5 mg by mouth 2 (two) times daily.        . cetirizine (ZYRTEC) 10 MG tablet Take 10 mg by mouth daily.        . clopidogrel (PLAVIX) 75 MG tablet Take 75 mg by  mouth daily.        . Coenzyme Q10 (COQ10) 100 MG CAPS Take 1 capsule by mouth daily.        . fish oil-omega-3 fatty acids 1000 MG capsule Take 2 g by mouth daily.        . furosemide (LASIX) 40 MG tablet Take 40 mg by mouth as needed.        . Misc Natural Products (OSTEO BI-FLEX ADV TRIPLE ST) TABS Take 2 tablets by mouth daily.       . Multiple Vitamin (MULTIVITAMIN) tablet Take 1 tablet by mouth daily.        . niacin (NIASPAN) 500 MG CR tablet Take 500 mg by mouth 4 (four) times daily.        . nitroGLYCERIN (NITROSTAT) 0.4 MG SL tablet Place 0.4 mg under the tongue every 5 (five) minutes as needed. For up to 3 doses.       . potassium chloride (KLOR-CON) 20 MEQ packet Take 20 mEq by mouth daily.        Marland Kitchen scopolamine (TRANSDERM-SCOP) 1.5 MG Place 1 patch onto the skin every 3 (three) days. Place behind ear.       . sildenafil (VIAGRA) 100 MG tablet Take 100 mg by mouth as needed.        . testosterone (TESTIM) 50 MG/5GM GEL Rub contents of two 5gm tubes into skin of arms/shoulder daily...         Allergies  Allergen Reactions  . Aspirin     REACTION: allergic to ASA w/ hives...  . Codeine   . Irbesartan     REACTION: allergic to ARBs w/ angioedema  . Ramipril     REACTION: Allergic to ACE's w/ angioedema...    Current Medications, Allergies, Past Medical History, Past Surgical History, Family  History, and Social History were reviewed in [employer] record.    Review of Systems       See HPI - all other systems neg except as noted...       The patient complains of dyspnea on exertion and muscle weakness.  The patient denies anorexia, fever, weight loss, weight gain, vision loss, decreased hearing, hoarseness, chest pain, syncope, peripheral edema, prolonged cough, headaches, hemoptysis, abdominal pain, melena, hematochezia, severe indigestion/heartburn, hematuria, incontinence, suspicious skin lesions, transient blindness, difficulty walking, depression,  unusual weight change, abnormal bleeding, enlarged lymph nodes, and angioedema.     Objective:   Physical Exam    WD, sl overweight, 60 y/o WM in NAD... GENERAL:  Alert & oriented; pleasant & cooperative... HEENT:  [place]/AT, EOM-wnl, PERRLA, EACs-clear, TMs-wnl, NOSE-clear, THROAT-s/p UPPP surg... NECK:  Supple w/ fairROM; no JVD; normal carotid impulses w/o bruits; no thyromegaly or nodules palpated; no lymphadenopathy. CHEST:  Clear to P & A; without wheezes/ rales/ or rhonchi. HEART:  Regular Rhythm; without murmurs/ rubs/ or gallops. ABDOMEN:  Soft & nontender; normal bowel sounds; no organomegaly or masses detected. EXT: without deformities, mild arthritic changes; no varicose veins/ venous insuffic/ or edema. Neuro:  intact w/o focal abn detected... DERM:  No lesions noted; no rash etc...   Assessment & Plan:   Hx OSA, s/p UPPP in 2004>  Doing satis & he denies sleep disordered breathing...  Hx recurrent bronchitis>  He quit smoking in 2004 & no recent bronchitic exacerbations...  HBP>  Controlled on BBlocker, CCB, Diuretic; continue same...  CAD>  Followed by DrBemsimhon on above + Plavix, he is allergic to ASA...  HYPERLIPIDEMIA>  On Liptor & Niaspan w/ good numbers at goal...  Borderline DM>  On diet alone & doing well...  GI> GERD, Divertics, Polyps>  Stable, f/u colon due this fall...  DJD/ LBP>  He treats self w/ OTC meds...  ANXIETY>  On Wellbutrin & Alprazolam, stable & he wants to continue the same.Marland KitchenMarland Kitchen

## 2010-12-19 ENCOUNTER — Encounter: Payer: Self-pay | Admitting: Pulmonary Disease

## 2010-12-30 ENCOUNTER — Other Ambulatory Visit: Payer: Self-pay | Admitting: Internal Medicine

## 2011-01-12 ENCOUNTER — Other Ambulatory Visit: Payer: Self-pay | Admitting: *Deleted

## 2011-01-12 MED ORDER — TESTOSTERONE 50 MG/5GM (1%) TD GEL: 10.0000 g | Freq: Every day | TRANSDERMAL | Status: DC

## 2011-02-02 ENCOUNTER — Ambulatory Visit (INDEPENDENT_AMBULATORY_CARE_PROVIDER_SITE_OTHER): Payer: 59

## 2011-02-02 DIAGNOSIS — Z23 Encounter for immunization: Secondary | ICD-10-CM

## 2011-02-04 ENCOUNTER — Other Ambulatory Visit: Payer: Self-pay | Admitting: Internal Medicine

## 2011-02-10 ENCOUNTER — Encounter: Payer: Self-pay | Admitting: Gastroenterology

## 2011-03-04 ENCOUNTER — Telehealth: Payer: Self-pay | Admitting: Pulmonary Disease

## 2011-03-04 MED ORDER — PREDNISONE (PAK) 5 MG PO TABS: ORAL_TABLET | ORAL | Status: DC

## 2011-03-04 MED ORDER — AMOXICILLIN-POT CLAVULANATE 875-125 MG PO TABS: 1.0000 | ORAL_TABLET | Freq: Two times a day (BID) | ORAL | Status: AC

## 2011-03-04 NOTE — Telephone Encounter (Signed)
I spoke with pt and he c/o bad chest congestion. Cough w/ yellow phlem, chest tightness, chest feels "raw" x 4 days. Pt states he is taking mucinex 1200 mg 2 tablets a day. Pt is requesting further recs from Dr. Kriste Basque. Please advise, thanks  Allergies  Allergen Reactions  . Aspirin     REACTION: allergic to ASA w/ hives...  . Codeine   . Irbesartan     REACTION: allergic to ARBs w/ angioedema  . Ramipril     REACTION: Allergic to ACE's w/ angioedema...    Carver Fila, CMA

## 2011-03-04 NOTE — Telephone Encounter (Signed)
Called and spoke with pt and he is aware of meds that have been sent to the pharmacy per SN.

## 2011-03-04 NOTE — Telephone Encounter (Signed)
Per SN--ok for augmentin 875mg   1 po bid until gone, pred dosepak  5 mg 6 day pack to take as directed, cont. mucinex 2 po bid with plenty of fluids.  Attempted to call pt but line is busy.

## 2011-03-12 ENCOUNTER — Other Ambulatory Visit: Payer: Self-pay | Admitting: Internal Medicine

## 2011-04-12 ENCOUNTER — Other Ambulatory Visit: Payer: Self-pay | Admitting: Pulmonary Disease

## 2011-04-12 MED ORDER — AMLODIPINE BESYLATE 5 MG PO TABS: 5.0000 mg | ORAL_TABLET | Freq: Every day | ORAL | Status: DC

## 2011-05-05 ENCOUNTER — Other Ambulatory Visit: Payer: Self-pay | Admitting: Pulmonary Disease

## 2011-05-13 ENCOUNTER — Encounter: Payer: Self-pay | Admitting: Pulmonary Disease

## 2011-05-13 ENCOUNTER — Ambulatory Visit (INDEPENDENT_AMBULATORY_CARE_PROVIDER_SITE_OTHER): Payer: 59 | Admitting: Pulmonary Disease

## 2011-05-13 ENCOUNTER — Ambulatory Visit (INDEPENDENT_AMBULATORY_CARE_PROVIDER_SITE_OTHER)
Admission: RE | Admit: 2011-05-13 | Discharge: 2011-05-13 | Disposition: A | Discharge status: Home / Self Care | Payer: 59 | Source: Ambulatory Visit | Attending: Pulmonary Disease | Admitting: Pulmonary Disease

## 2011-05-13 ENCOUNTER — Other Ambulatory Visit (INDEPENDENT_AMBULATORY_CARE_PROVIDER_SITE_OTHER): Payer: 59

## 2011-05-13 VITALS — BP 120/82 | HR 80 | Temp 98.7°F | Ht 70.0 in | Wt 218.6 lb

## 2011-05-13 DIAGNOSIS — K5730 Diverticulosis of large intestine without perforation or abscess without bleeding: Secondary | ICD-10-CM

## 2011-05-13 DIAGNOSIS — Z0000 Encounter for general adult medical examination without abnormal findings: Secondary | ICD-10-CM

## 2011-05-13 DIAGNOSIS — R7309 Other abnormal glucose: Secondary | ICD-10-CM

## 2011-05-13 DIAGNOSIS — I2510 Atherosclerotic heart disease of native coronary artery without angina pectoris: Secondary | ICD-10-CM

## 2011-05-13 DIAGNOSIS — M545 Low back pain: Secondary | ICD-10-CM

## 2011-05-13 DIAGNOSIS — J42 Unspecified chronic bronchitis: Secondary | ICD-10-CM

## 2011-05-13 DIAGNOSIS — G4733 Obstructive sleep apnea (adult) (pediatric): Secondary | ICD-10-CM

## 2011-05-13 DIAGNOSIS — K219 Gastro-esophageal reflux disease without esophagitis: Secondary | ICD-10-CM

## 2011-05-13 DIAGNOSIS — R413 Other amnesia: Secondary | ICD-10-CM | POA: Insufficient documentation

## 2011-05-13 DIAGNOSIS — M1990 Unspecified osteoarthritis, unspecified site: Secondary | ICD-10-CM

## 2011-05-13 DIAGNOSIS — F411 Generalized anxiety disorder: Secondary | ICD-10-CM

## 2011-05-13 DIAGNOSIS — E291 Testicular hypofunction: Secondary | ICD-10-CM

## 2011-05-13 DIAGNOSIS — D126 Benign neoplasm of colon, unspecified: Secondary | ICD-10-CM

## 2011-05-13 DIAGNOSIS — I10 Essential (primary) hypertension: Secondary | ICD-10-CM

## 2011-05-13 DIAGNOSIS — E785 Hyperlipidemia, unspecified: Secondary | ICD-10-CM

## 2011-05-13 LAB — CBC WITH DIFFERENTIAL/PLATELET
Basophils Relative: 0.7 % (ref 0.0–3.0)
Eosinophils Absolute: 0.4 10*3/uL (ref 0.0–0.7)
Lymphs Abs: 1.9 10*3/uL (ref 0.7–4.0)
MCHC: 34.3 g/dL (ref 30.0–36.0)
MCV: 97.7 fl (ref 78.0–100.0)
Monocytes Absolute: 0.9 10*3/uL (ref 0.1–1.0)
Neutro Abs: 7.1 10*3/uL (ref 1.4–7.7)
Neutrophils Relative %: 68.9 % (ref 43.0–77.0)
RBC: 4.9 Mil/uL (ref 4.22–5.81)

## 2011-05-13 LAB — URINALYSIS
Bilirubin Urine: NEGATIVE
Hgb urine dipstick: NEGATIVE
Ketones, ur: NEGATIVE
Total Protein, Urine: NEGATIVE
Urine Glucose: NEGATIVE

## 2011-05-13 LAB — BASIC METABOLIC PANEL
Chloride: 105 mEq/L (ref 96–112)
Creatinine, Ser: 1.1 mg/dL (ref 0.4–1.5)
Potassium: 4.3 mEq/L (ref 3.5–5.1)

## 2011-05-13 LAB — LIPID PANEL
LDL Cholesterol: 73 mg/dL (ref 0–99)
Total CHOL/HDL Ratio: 3
Triglycerides: 50 mg/dL (ref 0.0–149.0)

## 2011-05-13 LAB — HEPATIC FUNCTION PANEL
ALT: 24 U/L (ref 0–53)
Bilirubin, Direct: 0.2 mg/dL (ref 0.0–0.3)
Total Bilirubin: 0.9 mg/dL (ref 0.3–1.2)

## 2011-05-13 MED ORDER — VITAMIN D3 50 MCG (2000 UT) PO TABS: 1.0000 | ORAL_TABLET | Freq: Every day | ORAL | Status: DC

## 2011-05-13 MED ORDER — OMEGA-3 FATTY ACIDS 1000 MG PO CAPS: ORAL_CAPSULE | ORAL | Status: DC

## 2011-05-13 MED ORDER — CYANOCOBALAMIN 2000 MCG PO TABS: 2000.0000 ug | ORAL_TABLET | Freq: Every day | ORAL | Status: DC

## 2011-05-13 MED ORDER — CETIRIZINE HCL 10 MG PO TABS: 10.0000 mg | ORAL_TABLET | Freq: Every day | ORAL | Status: DC

## 2011-05-13 MED ORDER — OSTEO BI-FLEX ADV TRIPLE ST PO TABS: ORAL_TABLET | ORAL | Status: DC

## 2011-05-13 MED ORDER — ONE-DAILY MULTI VITAMINS PO TABS: 1.0000 | ORAL_TABLET | Freq: Every day | ORAL | Status: DC

## 2011-05-13 MED ORDER — COQ10 100 MG PO CAPS: ORAL_CAPSULE | ORAL | Status: DC

## 2011-05-13 NOTE — Patient Instructions (Signed)
Today we updated your med list in our EPIC system...    Continue your current medications the same...    We refilled your meds per request...  Today we did your follow up CXR & fasting blood work...    Please call the [facility] in a few days for your results...    Dial [id number] & when prompted enter your patient number followed by the # symbol...    Your patient number is:  [id number]#  Stay as active as possible...    Try to de-stress your life...     Call for any questions or if your want to pursue another sleep study or have a Neuro work up for memory...  Let's plan a recheck in 6 months.Marland KitchenMarland Kitchen

## 2011-05-13 NOTE — Progress Notes (Signed)
Subjective:    Patient ID: Larry Stone, male    DOB: 1950-09-02, 61 y.o.   MRN: [id number]  HPI 61 y/o WM here for a followup visit... he has multiple medical problems as noted below...  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  ~  July 21, 2009:  he never returned for fasting labs after his CPX 7/10... saw DrBensimhon 2/11 w/ f/u Cardiac CT showing <50% Lmain stenosis, 50% midLAD, additional nonobstructive dis; incidental 5mm RML nodule seen + mild fatty liver & ?mild thickening of esoph... we discussed the 5mm nodule & rec f/u CT in 79mo to check for stability... his BP remains under good control;  Chol numbers look fine  on his meds;  he has done a fab job w/ weight reduction down 16# to 209# today...  ~  June 08, 2010:  70mo ROV- recent URI w/ ZPak & Mucinex called in & feeling better overall;  requests written Rx for mult OTC meds so he can use hid "flex acct"...    He saw DrBensimhon 10/11 for f/u CAD & doing satis- no changes made;  he had Vasc Screen w/ mild carotid dis "some plaque", & norm AbdAo & ABIs;  also had f/u CT Chest for f/u tiny 3mm nodule RML- unchanged xyrs & benign, mild centilob emphysema, +coronary calcif, NAD...    He had FLP 11/11- looked good on his regimen of Lip40, NiaspanQid, FishOil;  BS remains stable on diet alone;  colonoscopy due 10/12 per GI;  remains stable on Wellbutrin & Alpraz (he wishes to continue same).  ~  June 18, 2010:  Add-on at pt request for mult questions> he wants me to know that he just feels terrible & that his "quality of life is going down over the last yr";  he notes that he exercises at the gym regularly & the trainer can't understand why he isn't gaining muscle, and he can't get rid of his gut despite watching his diet, & he notes sexual dysfunction, etc... he has checked everything on the internet & he wants to try TESTIM Gel  for 79mo to see if this makes a diff for him;  recent labs w/ Testos level = 364 ([phone]) & so we will go along w/ his desire for this trial> offered consideration of other meds, and offered Urology referral for second opinion & poss Testos shots but he declines & wants to try what he has researched>  we wrote Rx for TESTIM Gel 5gm tubes- start w/ one tube rubbed into skin of arms/ shoulders dailt for several weeks & incr to 2 tubes thereafter (he says his insurance will pay for up to 3 tubes per day)... he is rec to ret for Testos level in ~48months (he will call when ready).  ~  December 08, 2010:  79mo ROV & he tells me he is feeling great "couldn't be better"; he's been traveling, had "every vaccination known to man" at travel clinic but didn't bring card or list for [place] to review & place on his chart; notes that he is still on the TESTIM 2 of the 5gm tubes rubbed into skin daily but he oddly does not credit this intervention w/ his new found well-being etc;  He never returned for f/u Testosterone level on the gel & currently feels well enough that he doesn't care what the level is> he's going to  continue current med RX no matter what...    He saw DrBensimhon for Cards f/u 5/12 w/ CAD- s/p IWMI 2004 w/ resid 50% Lmain & nonobstructive dis elsewhere> he had Cardiac CT 2/11 w/ LM<50%, LAD50%, nonobstructive dis elsewhere;  NOTE small lung nodule, f/u CT stable, no change;  Doing well, exercising, no CP, palpit, SOB, etc; no changes made...    We reviewed meds, prev labs & XRays...  ~  May 13, 2011:  38mo ROV & here for CPX> he reports a good interval & his only new concern is his Memory (see below)- I offered Neuro eval & work up but he declines at this time; he had the 2012 flu vaccine 10/12; he wants Rx for Shingles vaccine as well; he requests Rx for all OTC meds for his Flex spending acct...  We discussed f/u CXR & Fasting blood work today> see prob list below>>           Problem List:     ALLERGIC to  ASA w/ hives, & ACE inhibitors/ ARBs too w/ angioedema.  OBSTRUCTIVE SLEEP APNEA (ICD-327.23) - s/p UPPP surgery in 2004 by DrRedman...  states he's doing OK, denies snoring, no daytime hypersom, wife not complaining... uses ZYRTEK for allergy symtoms. ~  1/13: He has started using the "breathe right nasal strips" qhs for recurrent snoring; still says he rests well, denies daytime hypersomnolence, & declines offer for repeat sleep study to check...  Hx of BRONCHITIS, RECURRENT (ICD-491.9) - he is an ex-smoker, prev 1 ppd for 78yrs, quit in 2004. ~  Hx tiny RML nodule seen on Cardiac CT 2/11 & apparently unchanged from old CTChest 2004... ~  f/u Chest CT 10/11 w/ 3mm RML nodule, no change & benign... ~  1/13: he denies breathing problems, intercurrent infections, etc...  HYPERTENSION (ICD-401.9) - on COREG 12.5mg Bid, AMLODIPINE 5mg /d, & LASIX 40mg - only as needed (& he hasn't needed any), + KCl daily ("DrBensimhon said to take it every day")... incr stress w/ mother's stroke and he's the care giver...  ~  8/12: BP= 118/84, taking meds regularly & tol well> denies HA, visual changes, CP, palipit, dizziness, syncope, dyspnea, edema, etc... ~  1/13: BP= 120/82 & he remains asymptomatic...  ARTERIOSCLEROTIC HEART DISEASE (ICD-414.00) - on PLAVIX 75mg /d, allergic to ASA... followed by DrBensimhon and seen regularly...  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 ~  f/u Cardiac CT 2/11 showed>  IMPRESSION: Calcified plaque in the left main with < 50% stenosis.  Calcified plaque in the proximal and mid LAD with at most moderate (around 50%) stenosis in the mid LAD.  The CFX is dominant, supplying the left PDA.  The AV CFX appears patent with mild stenosis at most. There are 2 moderate, closely spaced obtuse marginal vessels that have calcified plaque but are patent.  They are not seen well enough to comment on degree of stenosis within the vessels.   The RCA is small and nondominant. ~  Vasc Screen per Cards 10/11 showed mild carotid plaques, norm AbdAo & ABIs... ~  1/13:  He denies CP, palpit, SOB, etc; he exercises by walking regularly & using the bowflex.  HYPERLIPIDEMIA (ICD-272.4) - on LIPITOR 40mg /d, NIASPAN 500Qid, FISH OIL daily... ~  FLP3/09 showed TChol 117, TG 46, HDL 38, LDL 70 ~  FLP 7/10 > pt never ret for fasting labs after this visit. ~  FLP 2/11 showed TChol 123, TG 56, HDL 46, LDL 66 ~  FLP 11/11 showed TChol 114, TG 52, HDL 39, LDL 64... Continue Lip40, Niasp4/d, & FishOil. ~  FLP 1/13 showed TChol 131, TG 50, HDL 48, LDL 73  DIABETES MELLITUS, BORDERLINE (ICD-790.29) - on diet alone... ~  labs 3/09 (wt=225#) showed BS= 114, HgA1c= 6.2 ~  labs 7/10 (wt=229#) > pt never ret for fasting labs after this ov. ~  labs 2/11 (wt=206#) showed BS= 93, A1c= 5.9 ~  labs 2/12 (wt=214#) showed BS= 84, A1c= 6.1.Marland KitchenMarland Kitchen Needs to lose wt. ~  Labs 1/13 (wt=219#) showed BS= 109, A1c= 6.1  GERD (ICD-530.81) - he uses H2 blockers as needed.  DIVERTICULOSIS OF COLON (ICD-562.10) & COLONIC POLYPS (ICD-211.3) - his last polyps were ~3mm and removed in 2004= adenomatous... last colonoscopy 10/07 by Dorris Singh done while on his Plavix therapy showed divertics only... f/u planned 24yrs... there is a +fam hx of colon cancer in his father who died at age 78... (he tells me he would prefer to have colonoscopies every 60yrs). ~  f/u colon due 10/12...  We will refer chart to DrKaplan...  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 ~  Labs 2/12 showed Testos level = 364;  PSA= 0.43... ~  3/12:  Add-on appt at pt request for mult questions> he wants me to know that he feels terrible & that his "quality of life is going down over the last yr";  he notes that he exercises at the gym regularly & the trainer can't understand why he isn't gaining muscle, and he can't get rid of his gut despite watching his diet, & he notes  sexual dysfunction, etc; he has checked everything on the internet & he wants to try TESTIM Gel for 66mo to see if this makes a diff for him; recent labs w/ Testos level = 364 (350-890) & so we will go along w/ his desire for this trial> offered consideration of other meds, and offered Urology referral for second opinion & poss Testos shots but he declines & wants to try what he has researched>  we wrote Rx for TESTIM Gel 5gm tubes- start w/ one tube rubbed into skin of arms/ shoulders dailt for several weeks & incr to 2 tubes thereafter (he says his insurance will pay for up to 3 tubes per day)... he is rec to ret for Testos level in ~22months (he will call when ready- he never did). ~  8/12:  He states he is feeling much better, still on the TESTIM 2 of the 5gm tubes rubbed into skin daily, but he oddly does not credit this intervention w/ his new found well-being etc;  He never returned for f/u Testosterone level on the gel & currently feels well enough that he doesn't care what the level is> he's going to continue current med RX no matter what... ~  Labs 1/13 ?on Testim 2 tubes daily? showed Testosterone level = 363 (no change);  PSA= 0.48...  DEGENERATIVE JOINT DISEASE (ICD-715.90) - he reports doing fair- mostly c/o knees & hands ("I messed up my knee in a fall")... he saw Ortho ?who? on Glucosamine/ Chondroitin supplements.  LOW BACK PAIN SYNDROME (ICD-724.2)  MEMORY LOSS >>  ~  1/13: he states that his "memory is fuzzy" & "Clarity is missing"; occas he will swear that he said something but he really didn't; he's had prev scans etc & I have recommended a Neurology eval for his memory but he declines at this time "I'll think about it"  he said...  ANXIETY (ICD-300.00) - on WELLBUTRIN 150mg Bid, & ALPRAZOLAM 0.5mg  Prn... increased stress w/ mother's stroke last year... he's the main care giver w/ 2 sisters who don't help much...   Health Maintenance: ~  GI:  due for f/u colonoscopy 10/12... ~  GU:   DRE is neg, PSA=0.43, Testos= 364 (350-890), on Viagra Prn... ~  Immuniztions:  he is encouraged to get the yearly Flu vaccine... given PNEUMOVAX 2/12... He reports a lot of travel & he's been to the travel clinic & had "every vaccine known to man"...   Past Surgical History  Procedure Date  . Uvulopalatopharyngoplasty     surgery for OSA    Outpatient Encounter Prescriptions as of 05/13/2011  Medication Sig Dispense Refill  . ALPRAZolam (XANAX) 0.5 MG tablet Take 0.5 mg by mouth 3 (three) times daily as needed. for nerves.       Marland Kitchen amLODipine (NORVASC) 5 MG tablet Take 1 tablet (5 mg total) by mouth daily.  30 tablet  4  . ammonium lactate (LAC-HYDRIN) 12 % lotion Apply topically as directed.        Marland Kitchen buPROPion (WELLBUTRIN SR) 150 MG 12 hr tablet TAKE 1 TABLET BY MOUTH TWICE A DAY  60 tablet  5  . carvedilol (COREG) 12.5 MG tablet TAKE 1 TABLET TWICE DAILY  60 tablet  4  . cetirizine (ZYRTEC) 10 MG tablet Take 10 mg by mouth daily.        . Cholecalciferol (VITAMIN D3) 2000 UNITS TABS Take 1 tablet by mouth daily.      . clopidogrel (PLAVIX) 75 MG tablet TAKE 1 TABLET EVERY DAY  30 tablet  11  . Coenzyme Q10 (COQ10) 100 MG CAPS Take 1 capsule by mouth daily.        . cyanocobalamin 2000 MCG tablet Take 2,000 mcg by mouth daily.      . fish oil-omega-3 fatty acids 1000 MG capsule Take 2 g by mouth daily.        . furosemide (LASIX) 40 MG tablet Take 40 mg by mouth as needed.        Marland Kitchen LIPITOR 40 MG tablet TAKE 1 TABLET AT BEDTIME  30 tablet  11  . Misc Natural Products (OSTEO BI-FLEX ADV TRIPLE ST) TABS Take 2 tablets by mouth daily.       . Multiple Vitamin (MULTIVITAMIN) tablet Take 1 tablet by mouth daily.        . niacin (NIASPAN) 500 MG CR tablet Take 500 mg by mouth 4 (four) times daily.        . nitroGLYCERIN (NITROSTAT) 0.4 MG SL tablet Place 0.4 mg under the tongue every 5 (five) minutes as needed. For up to 3 doses.       . potassium chloride (KLOR-CON) 20 MEQ packet Take 20 mEq  by mouth daily.        . predniSONE (STERAPRED UNI-PAK) 5 MG TABS Take as directed  1 each  0  . testosterone (TESTIM) 50 MG/5GM GEL Place 10 g onto the skin daily. Rub contents of two 5gm tubes into skin of arms/shoulder daily.  300 g  5  . TRANSDERM-SCOP 1.5 MG APPLY 1 PATCH BEHIND EAR EVERY 3 DAYS  8 each  5  . VIAGRA 100 MG tablet TAKE 1 TABLET BY MOUTH AS NEEDED  15 tablet  3    Allergies  Allergen Reactions  . Aspirin     REACTION: allergic to ASA w/ hives...  . Codeine   .  Irbesartan     REACTION: allergic to ARBs w/ angioedema  . Ramipril     REACTION: Allergic to ACE's w/ angioedema...    Current Medications, Allergies, Past Medical History, Past Surgical History, Family History, and Social History were reviewed in [employer] record.    Review of Systems       See HPI - all other systems neg except as noted...       The patient complains of dyspnea on exertion and muscle weakness.  The patient denies anorexia, fever, weight loss, weight gain, vision loss, decreased hearing, hoarseness, chest pain, syncope, peripheral edema, prolonged cough, headaches, hemoptysis, abdominal pain, melena, hematochezia, severe indigestion/heartburn, hematuria, incontinence, suspicious skin lesions, transient blindness, difficulty walking, depression, unusual weight change, abnormal bleeding, enlarged lymph nodes, and angioedema.     Objective:   Physical Exam    WD, sl overweight, 61 y/o WM in NAD... GENERAL:  Alert & oriented; pleasant & cooperative... HEENT:  [place]/AT, EOM-wnl, PERRLA, EACs-clear, TMs-wnl, NOSE-clear, THROAT-s/p UPPP surg... NECK:  Supple w/ fairROM; no JVD; normal carotid impulses w/o bruits; no thyromegaly or nodules palpated; no lymphadenopathy. CHEST:  Clear to P & A; without wheezes/ rales/ or rhonchi. HEART:  Regular Rhythm; without murmurs/ rubs/ or gallops. ABDOMEN:  Soft & nontender; normal bowel sounds; no organomegaly or masses  detected. EXT: without deformities, mild arthritic changes; no varicose veins/ venous insuffic/ or edema. Neuro:  intact w/o focal abn detected... DERM:  No lesions noted; no rash etc...  RADIOLOGY DATA:  Reviewed in the EPIC EMR & discussed w/ the patient...    >>CXR 7/10 showed normal heart size, healed right rib fx, low lung vols, nipple shadows, NAD...    >>CT Chest 10/11 showed mild centrilob emphysema, 3mm nodule RML unchanged from 2004; coronary & great vessel calcif seen; distal esoph wall thickening suggests esophagitis...  LABORATORY DATA:  Reviewed in the EPIC EMR & discussed w/ the patient...    >>Fasting labs 1/13 all look good...   Assessment & Plan:   Hx OSA, s/p UPPP in 2004>  Recently noted recurrent snoring & using breathe right nasal strips; he doesn't want repeat slepp study or further eval, just want Rx for the strips so he can pay for them w/ his flex acct...  Hx recurrent bronchitis>  He quit smoking in 2004 & no recent bronchitic exacerbations...  HBP>  Controlled on BBlocker, CCB, Diuretic; continue same...  CAD>  Followed by DrBemsimhon on above + Plavix, he is allergic to ASA...  HYPERLIPIDEMIA>  On Liptor & Niaspan w/ good numbers at goal...  Borderline DM>  On diet alone & doing well...  GI> GERD, Divertics, Polyps>  Stable, f/u colon is now overdue & we will refer chart to [facility] for review...  DJD/ LBP>  He treats self w/ OTC meds...  ANXIETY>  On Wellbutrin & Alprazolam, stable & he wants to continue the same...   Patient's Medications  New Prescriptions   No medications on file  Previous Medications   ALPRAZOLAM (XANAX) 0.5 MG TABLET    Take 0.5 mg by mouth 3 (three) times daily as needed. for nerves.    AMLODIPINE (NORVASC) 5 MG TABLET    Take 1 tablet (5 mg total) by mouth daily.   AMMONIUM LACTATE (LAC-HYDRIN) 12 % LOTION    Apply topically as directed.     BUPROPION (WELLBUTRIN SR) 150 MG 12 HR TABLET    TAKE 1 TABLET BY MOUTH TWICE A  DAY   CARVEDILOL (  COREG) 12.5 MG TABLET    TAKE 1 TABLET TWICE DAILY   CLOPIDOGREL (PLAVIX) 75 MG TABLET    TAKE 1 TABLET EVERY DAY   FUROSEMIDE (LASIX) 40 MG TABLET    Take 40 mg by mouth as needed.     LIPITOR 40 MG TABLET    TAKE 1 TABLET AT BEDTIME   NIACIN (NIASPAN) 500 MG CR TABLET    Take 500 mg by mouth 4 (four) times daily.     NITROGLYCERIN (NITROSTAT) 0.4 MG SL TABLET    Place 0.4 mg under the tongue every 5 (five) minutes as needed. For up to 3 doses.    POTASSIUM CHLORIDE (KLOR-CON) 20 MEQ PACKET    Take 20 mEq by mouth daily.     PREDNISONE (STERAPRED UNI-PAK) 5 MG TABS    Take as directed   TESTOSTERONE (TESTIM) 50 MG/5GM GEL    Place 10 g onto the skin daily. Rub contents of two 5gm tubes into skin of arms/shoulder daily.   TRANSDERM-SCOP 1.5 MG    APPLY 1 PATCH BEHIND EAR EVERY 3 DAYS   VIAGRA 100 MG TABLET    TAKE 1 TABLET BY MOUTH AS NEEDED  Modified Medications   Modified Medication Previous Medication   CETIRIZINE (ZYRTEC) 10 MG TABLET cetirizine (ZYRTEC) 10 MG tablet      Take 1 tablet (10 mg total) by mouth daily.    Take 10 mg by mouth daily.     CHOLECALCIFEROL (VITAMIN D3) 2000 UNITS TABS Cholecalciferol (VITAMIN D3) 2000 UNITS TABS      Take 1 tablet by mouth daily.    Take 1 tablet by mouth daily.   COENZYME Q10 (COQ10) 100 MG CAPS Coenzyme Q10 (COQ10) 100 MG CAPS      Take as directed    Take 1 capsule by mouth daily.     CYANOCOBALAMIN 2000 MCG TABLET cyanocobalamin 2000 MCG tablet      Take 1 tablet (2,000 mcg total) by mouth daily.    Take 2,000 mcg by mouth daily.   FISH OIL-OMEGA-3 FATTY ACIDS 1000 MG CAPSULE fish oil-omega-3 fatty acids 1000 MG capsule      Take as directed    Take 2 g by mouth daily.     MISC NATURAL PRODUCTS (OSTEO BI-FLEX ADV TRIPLE ST) TABS Misc Natural Products (OSTEO BI-FLEX ADV TRIPLE ST) TABS      Take as directed    Take 2 tablets by mouth daily.    MULTIPLE VITAMIN (MULTIVITAMIN) TABLET Multiple Vitamin (MULTIVITAMIN) tablet       Take 1 tablet by mouth daily.    Take 1 tablet by mouth daily.    Discontinued Medications   No medications on file

## 2011-05-15 ENCOUNTER — Encounter: Payer: Self-pay | Admitting: Pulmonary Disease

## 2011-05-17 ENCOUNTER — Encounter: Payer: Self-pay | Admitting: Gastroenterology

## 2011-05-17 ENCOUNTER — Other Ambulatory Visit: Payer: Self-pay | Admitting: Pulmonary Disease

## 2011-05-17 DIAGNOSIS — K5730 Diverticulosis of large intestine without perforation or abscess without bleeding: Secondary | ICD-10-CM

## 2011-05-17 DIAGNOSIS — D126 Benign neoplasm of colon, unspecified: Secondary | ICD-10-CM

## 2011-05-19 ENCOUNTER — Encounter: Payer: Self-pay | Admitting: Gastroenterology

## 2011-05-19 ENCOUNTER — Telehealth: Payer: Self-pay | Admitting: Allergy

## 2011-05-19 MED ORDER — ALPRAZOLAM 0.5 MG PO TABS: 0.5000 mg | ORAL_TABLET | Freq: Three times a day (TID) | ORAL | Status: DC | PRN

## 2011-05-19 NOTE — Telephone Encounter (Signed)
[facility] [address] Alprazolam 0.5mg  take 1 tablet tid as needed for nerves #90 Allergies  Allergen Reactions  . Aspirin     REACTION: allergic to ASA w/ hives...  . Codeine   . Irbesartan     REACTION: allergic to ARBs w/ angioedema  . Ramipril     REACTION: Allergic to ACE's w/ angioedema...   Dr Kriste Basque is this ok to fill Thank you

## 2011-06-01 ENCOUNTER — Ambulatory Visit: Payer: 59 | Admitting: Gastroenterology

## 2011-06-02 ENCOUNTER — Encounter: Payer: 59 | Admitting: Gastroenterology

## 2011-06-03 ENCOUNTER — Encounter: Payer: Self-pay | Admitting: Gastroenterology

## 2011-06-08 ENCOUNTER — Other Ambulatory Visit: Payer: Self-pay | Admitting: Pulmonary Disease

## 2011-07-13 ENCOUNTER — Other Ambulatory Visit: Payer: Self-pay | Admitting: Pulmonary Disease

## 2011-07-13 MED ORDER — TESTOSTERONE 50 MG/5GM (1%) TD GEL: 10.0000 g | Freq: Every day | TRANSDERMAL | Status: DC

## 2011-07-13 NOTE — Telephone Encounter (Signed)
[facility] requesting  testim 1% 50mg  gell Apply contest of two 5 gm tubes onto skin of arms /shoulders daily X71fills Allergies  Allergen Reactions  . Aspirin     REACTION: allergic to ASA w/ hives...  . Codeine   . Irbesartan     REACTION: allergic to ARBs w/ angioedema  . Ramipril     REACTION: Allergic to ACE's w/ angioedema.Marland KitchenMarland Kitchen

## 2011-08-04 ENCOUNTER — Telehealth: Payer: Self-pay | Admitting: Pulmonary Disease

## 2011-08-04 MED ORDER — PREDNISONE (PAK) 5 MG PO TABS: ORAL_TABLET | ORAL | Status: DC

## 2011-08-04 MED ORDER — AMOXICILLIN-POT CLAVULANATE 875-125 MG PO TABS: 1.0000 | ORAL_TABLET | Freq: Two times a day (BID) | ORAL | Status: AC

## 2011-08-04 NOTE — Telephone Encounter (Signed)
I spoke with the pt and he is c/o since yesterday having a dry cough, chest congestion, "raw" feeling in his chest and overall feels bad. He is requesting an abx be called in. Please advise. [name], CMA Allergies  Allergen Reactions  . Aspirin     REACTION: allergic to ASA w/ hives...  . Codeine   . Irbesartan     REACTION: allergic to ARBs w/ angioedema  . Ramipril     REACTION: Allergic to ACE's w/ angioedema.Marland KitchenMarland Kitchen

## 2011-08-04 NOTE — Telephone Encounter (Signed)
Per SN--ok for pt to have augmentin 875mg   #14  1 po bid, take align once daily while on the abx, pred dosepak  5 mg  6 day pack as directed.  Pt is aware that meds have been sent to his pharmacy.  Nothing further is needed.

## 2011-08-17 ENCOUNTER — Other Ambulatory Visit: Payer: Self-pay

## 2011-08-17 MED ORDER — CARVEDILOL 12.5 MG PO TABS: 12.5000 mg | ORAL_TABLET | Freq: Two times a day (BID) | ORAL | Status: DC

## 2011-08-24 ENCOUNTER — Telehealth: Payer: Self-pay | Admitting: Pulmonary Disease

## 2011-08-24 MED ORDER — HYDROCOD POLST-CHLORPHEN POLST 10-8 MG/5ML PO LQCR: 5.0000 mL | Freq: Two times a day (BID) | ORAL | Status: DC

## 2011-08-24 NOTE — Telephone Encounter (Signed)
Per SN---ok for pt to have tussionex #182ml  1 tsp every 12 hours prn cough with 1 refill.  Called and spoke with pt and he is aware of SN recs and that this has been sent to the pharmacy for the pt.

## 2011-08-24 NOTE — Telephone Encounter (Signed)
Pt calling again in ref to previous msg says he needs a call back today can be reached at [phone].Raylene Everts

## 2011-08-24 NOTE — Telephone Encounter (Signed)
Spoke with pt. He states finished abx and pred dose pack that we sent on 08-04-11 and his cough improved some, but still bothering him a lot at hs esp. Cough is dry and hacky. He states also feels very fatigued, relates this partly to not sleeping well due to cough. He states taking mucinex with no relief. Please advise, thanks! Allergies  Allergen Reactions  . Aspirin     REACTION: allergic to ASA w/ hives...  . Codeine   . Irbesartan     REACTION: allergic to ARBs w/ angioedema  . Ramipril     REACTION: Allergic to ACE's w/ angioedema.Marland KitchenMarland Kitchen

## 2011-09-14 ENCOUNTER — Other Ambulatory Visit: Payer: Self-pay | Admitting: Pulmonary Disease

## 2011-11-03 ENCOUNTER — Encounter: Payer: Self-pay | Admitting: Pulmonary Disease

## 2011-11-03 ENCOUNTER — Ambulatory Visit (INDEPENDENT_AMBULATORY_CARE_PROVIDER_SITE_OTHER): Payer: 59 | Admitting: Pulmonary Disease

## 2011-11-03 ENCOUNTER — Encounter (INDEPENDENT_AMBULATORY_CARE_PROVIDER_SITE_OTHER): Payer: 59

## 2011-11-03 VITALS — BP 132/76 | HR 67 | Temp 96.8°F | Ht 70.0 in | Wt 221.0 lb

## 2011-11-03 DIAGNOSIS — I872 Venous insufficiency (chronic) (peripheral): Secondary | ICD-10-CM

## 2011-11-03 DIAGNOSIS — M545 Low back pain: Secondary | ICD-10-CM

## 2011-11-03 DIAGNOSIS — R609 Edema, unspecified: Secondary | ICD-10-CM

## 2011-11-03 DIAGNOSIS — F411 Generalized anxiety disorder: Secondary | ICD-10-CM

## 2011-11-03 DIAGNOSIS — I2510 Atherosclerotic heart disease of native coronary artery without angina pectoris: Secondary | ICD-10-CM

## 2011-11-03 DIAGNOSIS — M79609 Pain in unspecified limb: Secondary | ICD-10-CM

## 2011-11-03 DIAGNOSIS — K5730 Diverticulosis of large intestine without perforation or abscess without bleeding: Secondary | ICD-10-CM

## 2011-11-03 DIAGNOSIS — K219 Gastro-esophageal reflux disease without esophagitis: Secondary | ICD-10-CM

## 2011-11-03 DIAGNOSIS — E291 Testicular hypofunction: Secondary | ICD-10-CM

## 2011-11-03 DIAGNOSIS — D126 Benign neoplasm of colon, unspecified: Secondary | ICD-10-CM

## 2011-11-03 DIAGNOSIS — I10 Essential (primary) hypertension: Secondary | ICD-10-CM

## 2011-11-03 DIAGNOSIS — R7309 Other abnormal glucose: Secondary | ICD-10-CM

## 2011-11-03 DIAGNOSIS — E785 Hyperlipidemia, unspecified: Secondary | ICD-10-CM

## 2011-11-03 DIAGNOSIS — M1990 Unspecified osteoarthritis, unspecified site: Secondary | ICD-10-CM

## 2011-11-03 NOTE — Progress Notes (Signed)
Subjective:    Patient ID: Larry Stone, male    DOB: June 01, 1950, 61 y.o.   MRN: [id number]  HPI 61 y/o WM here for a followup visit... he has multiple medical problems as noted below...  he is followed by Larry Stone for Cards- Hx CAD, s/p IWMI 9/04, known residual <50%Lmain stenosis & nonobstructive dis elsewhere on Cardiac CT 2/11...  ~  July 21, 2009:  he never returned for fasting labs after his CPX 7/10... saw Larry Stone 2/11 w/ f/u Cardiac CT showing <50% Lmain stenosis, 50% midLAD, additional nonobstructive dis; incidental 5mm RML nodule seen + mild fatty liver & ?mild thickening of esoph... we discussed the 5mm nodule & rec f/u CT in 224mo to check for stability... his BP remains under good control;  Chol numbers look fine  on his meds;  he has done a fab job w/ weight reduction down 16# to 209# today...  ~  June 08, 2010:  224mo ROV- recent URI w/ ZPak & Mucinex called in & feeling better overall;  requests written Rx for mult OTC meds so he can use hid "flex acct"...    He saw Larry Stone 10/11 for f/u CAD & doing satis- no changes made;  he had Vasc Screen w/ mild carotid dis "some plaque", & norm AbdAo & ABIs;  also had f/u CT Chest for f/u tiny 3mm nodule RML- unchanged xyrs & benign, mild centilob emphysema, +coronary calcif, NAD...    He had FLP 11/11- looked good on his regimen of Lip40, NiaspanQid, FishOil;  BS remains stable on diet alone;  colonoscopy due 10/12 per GI;  remains stable on Wellbutrin & Alpraz (he wishes to continue same).  ~  June 18, 2010:  Add-on at pt request for mult questions> he wants me to know that he just feels terrible & that his "quality of life is going down over the last yr";  he notes that he exercises at the gym regularly & the trainer can't understand why he isn't gaining muscle, and he can't get rid of his gut despite watching his diet, & he notes sexual dysfunction, etc... he has checked everything on the internet & he wants to try TESTIM Gel  for 224mo to see if this makes a diff for him;  recent labs w/ Testos level = 364 ([phone]) & so we will go along w/ his desire for this trial> offered consideration of other meds, and offered Urology referral for second opinion & poss Testos shots but he declines & wants to try what he has researched>  we wrote Rx for TESTIM Gel 5gm tubes- start w/ one tube rubbed into skin of arms/ shoulders dailt for several weeks & incr to 2 tubes thereafter (he says his insurance will pay for up to 3 tubes per day)... he is rec to ret for Testos level in ~57months (he will call when ready).  ~  December 08, 2010:  224mo ROV & he tells me he is feeling great "couldn't be better"; he's been traveling, had "every vaccination known to man" at travel clinic but didn't bring card or list for [place] to review & place on his chart; notes that he is still on the TESTIM 2 of the 5gm tubes rubbed into skin daily but he oddly does not credit this intervention w/ his new found well-being etc;  He never returned for f/u Testosterone level on the gel & currently feels well enough that he doesn't care what the level is> he's going to  continue current med RX no matter what...    He saw Larry Stone for Cards f/u 5/12 w/ CAD- s/p IWMI 2004 w/ resid 50% Lmain & nonobstructive dis elsewhere> he had Cardiac CT 2/11 w/ LM<50%, LAD50%, nonobstructive dis elsewhere;  NOTE small lung nodule, f/u CT stable, no change;  Doing well, exercising, no CP, palpit, SOB, etc; no changes made...    We reviewed meds, prev labs & XRays... EKG 5/12 showed NSR, rate 64, RBBB, poss infer infarct age undetermined...  ~  May 13, 2011:  58mo ROV & here for CPX> he reports a good interval & his only new concern is his Memory (see below)- I offered Neuro eval & work up but he declines at this time; he had the 2012 flu vaccine 10/12; he wants Rx for Shingles vaccine as well; he requests Rx for all OTC meds for his Flex spending acct...  We discussed f/u CXR & Fasting blood  work today> see prob list below>> CXR 1/13 showed normal heart size, lungs clear w/ old healed right rib fx, NAD... LABS 1/13:  FLP- at goals on Lip40;  Chems- wnl & A1c=6.1;  CBC- wnl;  TSH=1.82;  PSA=0.48;  Testos=363;  UA= clear...  ~  November 03, 2011:  86mo ROV & Larry Stone continues on an impressive med list including lots of supplements that he wants prescriptions written for his flex spending acct... He's been traveling to [place], [place], [place] & doing satis> denies CP, palpit, dizzy, SOB, edema, etc... He is concerned however about LE edema, took his Lasix daily but didn't notice much change "it's really got me concerned"> we discussed VI & the need for low sodium, elevation, support hose, Lasix rx, & we will check Ven Dopplers for completeness...  He is also concerned about the cost of Viagra & wants to try Cialis.    We reviewed prob list, meds, xrays and labs> see below>> Venous Dopplers of LE's 7/13 showed neg for DVT... I told him next step was to refer to a vein clinic...           Problem List:     ALLERGIC to ASA w/ hives, & ACE inhibitors/ ARBs too w/ angioedema.  OBSTRUCTIVE SLEEP APNEA (ICD-327.23) - s/p UPPP surgery in 2004 by Larry Stone...  states he's doing OK, denies snoring, no daytime hypersom, wife not complaining... uses ZYRTEK for allergy symtoms. ~  1/13: He has started using the "breathe right nasal strips" qhs for recurrent snoring; still says he rests well, denies daytime hypersomnolence, & declines offer for repeat sleep study to check...  Hx of BRONCHITIS, RECURRENT (ICD-491.9) - he is an ex-smoker, prev 1 ppd for 20yrs, quit in 2004. ~  Hx tiny RML nodule seen on Cardiac CT 2/11 & apparently unchanged from old CTChest 2004... ~  f/u Chest CT 10/11 w/ 3mm RML nodule, no change & benign... ~  1/13: he denies breathing problems, intercurrent infections, etc...  HYPERTENSION (ICD-401.9) - on COREG 12.5mg Bid, AMLODIPINE 5mg /d, & LASIX 40mg - only as needed (& he hasn't needed any),  + KCl daily ("Larry Stone said to take it every day")... incr stress w/ mother's stroke and he's the care giver...  ~  8/12: BP= 118/84, taking meds regularly & tol well> denies HA, visual changes, CP, palipit, dizziness, syncope, dyspnea, edema, etc... ~  1/13: BP= 120/82 & he remains asymptomatic...  ARTERIOSCLEROTIC HEART DISEASE (ICD-414.00) - on PLAVIX 75mg /d, allergic to ASA... followed by Larry Stone and seen regularly...  ~  s/p inferolat MI 9/04-  resid 50% Lmain & non-obstructive dz in other vessels... ~  f/u Cardiac CT 2/11 showed>  IMPRESSION: Calcified plaque in the left main with < 50% stenosis.  Calcified plaque in the proximal and mid LAD with at most moderate (around 50%) stenosis in the mid LAD.  The CFX is dominant, supplying the left PDA.  The AV CFX appears patent with mild stenosis at most. There are 2 moderate, closely spaced obtuse marginal vessels that have calcified plaque but are patent.  They are not seen well enough to comment on degree of stenosis within the vessels.  The RCA is small and nondominant. ~  Vasc Screen per Cards 10/11 showed mild carotid plaques, norm AbdAo & ABIs... ~  1/13:  He denies CP, palpit, SOB, etc; he exercises by walking regularly & using the bowflex.  HYPERLIPIDEMIA (ICD-272.4) - on LIPITOR 40mg /d, NIASPAN 500Qid, FISH OIL daily... ~  FLP3/09 showed TChol 117, TG 46, HDL 38, LDL 70 ~  FLP 7/10 > pt never ret for fasting labs after this visit. ~  FLP 2/11 showed TChol 123, TG 56, HDL 46, LDL 66 ~  FLP 11/11 showed TChol 114, TG 52, HDL 39, LDL 64... Continue Lip40, Niasp4/d, & FishOil. ~  FLP 1/13 showed TChol 131, TG 50, HDL 48, LDL 73  DIABETES MELLITUS, BORDERLINE (ICD-790.29) - on diet alone... ~  labs 3/09 (wt=225#) showed BS= 114, HgA1c= 6.2 ~  labs 7/10 (wt=229#) > pt never ret for fasting labs after this ov. ~  labs 2/11 (wt=206#) showed BS= 93, A1c= 5.9 ~  labs 2/12 (wt=214#) showed BS= 84, A1c= 6.1.Marland KitchenMarland Kitchen Needs to lose  wt. ~  Labs 1/13 (wt=219#) showed BS= 109, A1c= 6.1  GERD (ICD-530.81) - he uses H2 blockers as needed.  DIVERTICULOSIS OF COLON (ICD-562.10) & COLONIC POLYPS (ICD-211.3) - his last polyps were ~100mm and removed in 2004= adenomatous... last colonoscopy 10/07 by Dorris Singh done while on his Plavix therapy showed divertics only... f/u planned 16yrs... there is a +fam hx of colon cancer in his father who died at age 33... (he tells me he would prefer to have colonoscopies every 57yrs). ~  f/u colon due 10/12...  We will refer chart to DrKaplan...  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 ~  Labs 2/12 showed Testos level = 364;  PSA= 0.43... ~  3/12:  Add-on appt at pt request for mult questions> he wants me to know that he feels terrible & that his "quality of life is going down over the last yr";  he notes that he exercises at the gym regularly & the trainer can't understand why he isn't gaining muscle, and he can't get rid of his gut despite watching his diet, & he notes sexual dysfunction, etc; he has checked everything on the internet & he wants to try TESTIM Gel for 60mo to see if this makes a diff for him; recent labs w/ Testos level = 364 ([phone]) & so we will go along w/ his desire for this trial> offered consideration of other meds, and offered Urology referral for second opinion & poss Testos shots but he declines & wants to try what he has researched>  we wrote Rx for TESTIM Gel 5gm tubes- start w/ one tube rubbed into skin of arms/ shoulders dailt for several weeks & incr to 2 tubes thereafter (he says his insurance will pay for up to 3 tubes per day)... he is rec to ret for Testos level  in ~53months (he will call when ready- he never did). ~  8/12:  He states he is feeling much better, still on the TESTIM 2 of the 5gm tubes rubbed into skin daily, but he oddly does not credit this intervention w/ his new found well-being etc;  He never returned for f/u  Testosterone level on the gel & currently feels well enough that he doesn't care what the level is> he's going to continue current med RX no matter what... ~  Labs 1/13 ?on Testim 2 tubes daily? showed Testosterone level = 363 (no change);  PSA= 0.48...  DEGENERATIVE JOINT DISEASE (ICD-715.90) - he reports doing fair- mostly c/o knees & hands ("I messed up my knee in a fall")... he saw Ortho ?who? on Glucosamine/ Chondroitin supplements.  LOW BACK PAIN SYNDROME (ICD-724.2)  MEMORY LOSS >>  ~  1/13: he states that his "memory is fuzzy" & "Clarity is missing"; occas he will swear that he said something but he really didn't; he's had prev scans etc & I have recommended a Neurology eval for his memory but he declines at this time "I'll think about it" he said...  ANXIETY (ICD-300.00) - on WELLBUTRIN 150mg Bid, & ALPRAZOLAM 0.5mg  Prn... increased stress w/ mother's stroke last year... he's the main care giver w/ 2 sisters who don't help much...   Health Maintenance: ~  GI:  due for f/u colonoscopy 10/12... ~  GU:  DRE is neg, PSA=0.43, Testos= 364 (350-890), on Viagra Prn... ~  Immuniztions:  he is encouraged to get the yearly Flu vaccine... given PNEUMOVAX 2/12... He reports a lot of travel & he's been to the travel clinic & had "every vaccine known to man"...   Past Surgical History  Procedure Date  . Uvulopalatopharyngoplasty     surgery for OSA    Outpatient Encounter Prescriptions as of 11/03/2011  Medication Sig Dispense Refill  . ALPRAZolam (XANAX) 0.5 MG tablet Take 1 tablet (0.5 mg total) by mouth 3 (three) times daily as needed. for nerves.  90 tablet  5  . amLODipine (NORVASC) 5 MG tablet TAKE 1 TABLET BY MOUTH EVERY DAY  30 tablet  6  . ammonium lactate (LAC-HYDRIN) 12 % lotion Apply topically as directed.        Marland Kitchen aspirin 81 MG tablet Take 81 mg by mouth daily.      Marland Kitchen b complex vitamins tablet Take 1 tablet by mouth daily.      Marland Kitchen buPROPion (WELLBUTRIN SR) 150 MG 12 hr tablet TAKE  1 TABLET BY MOUTH TWICE A DAY  60 tablet  5  . carvedilol (COREG) 12.5 MG tablet Take 1 tablet (12.5 mg total) by mouth 2 (two) times daily with a meal.  60 tablet  2  . cetirizine (ZYRTEC) 10 MG tablet Take 1 tablet (10 mg total) by mouth daily.  30 tablet  11  . chlorpheniramine-HYDROcodone (TUSSIONEX PENNKINETIC ER) 10-8 MG/5ML LQCR Take 5 mLs by mouth every 12 (twelve) hours.  140 mL  1  . Cholecalciferol (VITAMIN D3) 2000 UNITS TABS Take 1 tablet by mouth daily.  30 tablet  11  . clopidogrel (PLAVIX) 75 MG tablet TAKE 1 TABLET EVERY DAY  30 tablet  11  . Coenzyme Q10 (COQ10) 100 MG CAPS Take as directed  30 each  11  . cyanocobalamin 2000 MCG tablet Take 1 tablet (2,000 mcg total) by mouth daily.  30 tablet  11  . fish oil-omega-3 fatty acids 1000 MG capsule Take as directed  30  capsule  11  . furosemide (LASIX) 40 MG tablet Take 40 mg by mouth as needed.        Marland Kitchen KRILL OIL 1000 MG CAPS Take 1 capsule by mouth daily.      Marland Kitchen LIPITOR 40 MG tablet TAKE 1 TABLET AT BEDTIME  30 tablet  11  . Misc Natural Products (OSTEO BI-FLEX ADV TRIPLE ST) TABS Take as directed  60 tablet  11  . Multiple Vitamin (MULTIVITAMIN) tablet Take 1 tablet by mouth daily.  30 tablet  11  . niacin (NIASPAN) 500 MG CR tablet Take 500 mg by mouth 4 (four) times daily.        . nitroGLYCERIN (NITROSTAT) 0.4 MG SL tablet Place 0.4 mg under the tongue every 5 (five) minutes as needed. For up to 3 doses.       . potassium chloride (KLOR-CON) 20 MEQ packet Take 20 mEq by mouth daily.        . Simethicone (GAS-X EXTRA STRENGTH) 125 MG CAPS Prn      . testosterone (TESTIM) 50 MG/5GM GEL Place 10 g onto the skin daily. Rub contents of two 5gm tubes into skin of arms/shoulder daily.  300 g  5  . VIAGRA 100 MG tablet TAKE 1 TABLET BY MOUTH AS NEEDED  15 tablet  3  . TRANSDERM-SCOP 1.5 MG APPLY 1 PATCH BEHIND EAR EVERY 3 DAYS  8 each  5  . DISCONTD: predniSONE (STERAPRED UNI-PAK) 5 MG TABS Take as directed  21 each  0     Allergies  Allergen Reactions  . Aspirin     REACTION: allergic to ASA w/ hives...  . Codeine   . Irbesartan     REACTION: allergic to ARBs w/ angioedema  . Ramipril     REACTION: Allergic to ACE's w/ angioedema...    Current Medications, Allergies, Past Medical History, Past Surgical History, Family History, and Social History were reviewed in [employer] record.    Review of Systems       See HPI - all other systems neg except as noted...       The patient complains of dyspnea on exertion and muscle weakness.  The patient denies anorexia, fever, weight loss, weight gain, vision loss, decreased hearing, hoarseness, chest pain, syncope, peripheral edema, prolonged cough, headaches, hemoptysis, abdominal pain, melena, hematochezia, severe indigestion/heartburn, hematuria, incontinence, suspicious skin lesions, transient blindness, difficulty walking, depression, unusual weight change, abnormal bleeding, enlarged lymph nodes, and angioedema.     Objective:   Physical Exam    WD, sl overweight, 61 y/o WM in NAD... GENERAL:  Alert & oriented; pleasant & cooperative... HEENT:  [place]/AT, EOM-wnl, PERRLA, EACs-clear, TMs-wnl, NOSE-clear, THROAT-s/p UPPP surg... NECK:  Supple w/ fairROM; no JVD; normal carotid impulses w/o bruits; no thyromegaly or nodules palpated; no lymphadenopathy. CHEST:  Clear to P & A; without wheezes/ rales/ or rhonchi. HEART:  Regular Rhythm; without murmurs/ rubs/ or gallops. ABDOMEN:  Soft & nontender; normal bowel sounds; no organomegaly or masses detected. EXT: without deformities, mild arthritic changes; no varicose veins/ venous insuffic/ or edema. Neuro:  intact w/o focal abn detected... DERM:  No lesions noted; no rash etc...  RADIOLOGY DATA:  Reviewed in the EPIC EMR & discussed w/ the patient...    >>CXR 7/10 showed normal heart size, healed right rib fx, low lung vols, nipple shadows, NAD...    >>CT Chest 10/11 showed mild  centrilob emphysema, 3mm nodule RML unchanged from 2004; coronary & great vessel  calcif seen; distal esoph wall thickening suggests esophagitis...  LABORATORY DATA:  Reviewed in the EPIC EMR & discussed w/ the patient...    >>Fasting labs 1/13 all look good...   Assessment & Plan:   Ven Insuffic, Edema>  We reviewed the pathophysiology & the need for no salt, elevation, support hse, Lasix40;  ven dopplers are neg/ normal/ no DVT...   Hx OSA, s/p UPPP in 2004>  Recently noted recurrent snoring & using breathe right nasal strips; he doesn't want repeat slepp study or further eval, just want Rx for the strips so he can pay for them w/ his flex acct...  Hx recurrent bronchitis>  He quit smoking in 2004 & no recent bronchitic exacerbations...  HBP>  Controlled on BBlocker, CCB, Diuretic; continue same...  CAD>  Followed by DrBemsimhon on above + Plavix, he is allergic to ASA...  HYPERLIPIDEMIA>  On Liptor & Niaspan w/ good numbers at goal...  Borderline DM>  On diet alone & doing well...  GI> GERD, Divertics, Polyps>  Stable, f/u colon is now overdue & we will refer chart to [facility] for review...  DJD/ LBP>  He treats self w/ OTC meds...  ANXIETY>  On Wellbutrin & Alprazolam, stable & he wants to continue the same...   Patient's Medications  New Prescriptions   No medications on file  Previous Medications   ALPRAZOLAM (XANAX) 0.5 MG TABLET    Take 1 tablet (0.5 mg total) by mouth 3 (three) times daily as needed. for nerves.   AMLODIPINE (NORVASC) 5 MG TABLET    TAKE 1 TABLET BY MOUTH EVERY DAY   AMMONIUM LACTATE (LAC-HYDRIN) 12 % LOTION    Apply topically as directed.     ASPIRIN 81 MG TABLET    Take 81 mg by mouth daily.   B COMPLEX VITAMINS TABLET    Take 1 tablet by mouth daily.   BUPROPION (WELLBUTRIN SR) 150 MG 12 HR TABLET    TAKE 1 TABLET BY MOUTH TWICE A DAY   CARVEDILOL (COREG) 12.5 MG TABLET    Take 1 tablet (12.5 mg total) by mouth 2 (two) times daily with a meal.    CETIRIZINE (ZYRTEC) 10 MG TABLET    Take 1 tablet (10 mg total) by mouth daily.   CHLORPHENIRAMINE-HYDROCODONE (TUSSIONEX PENNKINETIC ER) 10-8 MG/5ML LQCR    Take 5 mLs by mouth every 12 (twelve) hours.   CHOLECALCIFEROL (VITAMIN D3) 2000 UNITS TABS    Take 1 tablet by mouth daily.   CLOPIDOGREL (PLAVIX) 75 MG TABLET    TAKE 1 TABLET EVERY DAY   COENZYME Q10 (COQ10) 100 MG CAPS    Take as directed   CYANOCOBALAMIN 2000 MCG TABLET    Take 1 tablet (2,000 mcg total) by mouth daily.   FISH OIL-OMEGA-3 FATTY ACIDS 1000 MG CAPSULE    Take as directed   FUROSEMIDE (LASIX) 40 MG TABLET    Take 40 mg by mouth as needed.     KRILL OIL 1000 MG CAPS    Take 1 capsule by mouth daily.   LIPITOR 40 MG TABLET    TAKE 1 TABLET AT BEDTIME   MISC NATURAL PRODUCTS (OSTEO BI-FLEX ADV TRIPLE ST) TABS    Take as directed   MULTIPLE VITAMIN (MULTIVITAMIN) TABLET    Take 1 tablet by mouth daily.   NIACIN (NIASPAN) 500 MG CR TABLET    Take 500 mg by mouth 4 (four) times daily.     NITROGLYCERIN (NITROSTAT) 0.4 MG SL TABLET  Place 0.4 mg under the tongue every 5 (five) minutes as needed. For up to 3 doses.    POTASSIUM CHLORIDE (KLOR-CON) 20 MEQ PACKET    Take 20 mEq by mouth daily.     SIMETHICONE (GAS-X EXTRA STRENGTH) 125 MG CAPS    Prn   TESTOSTERONE (TESTIM) 50 MG/5GM GEL    Place 10 g onto the skin daily. Rub contents of two 5gm tubes into skin of arms/shoulder daily.   TRANSDERM-SCOP 1.5 MG    APPLY 1 PATCH BEHIND EAR EVERY 3 DAYS   VIAGRA 100 MG TABLET    TAKE 1 TABLET BY MOUTH AS NEEDED  Modified Medications   No medications on file  Discontinued Medications   PREDNISONE (STERAPRED UNI-PAK) 5 MG TABS    Take as directed

## 2011-11-03 NOTE — Patient Instructions (Addendum)
Today we updated your med list in our EPIC system...    Continue your current medications the same...  For your leg swelling:    We will check a Venous doppler study to be SURE there are no blood clots in your leg...    Try to eliminate all the salt from your diet- NO SODIUM...    Keep your legs elevated as much as possible...    Wear support hose whenever possible...    Take the Lasix each AM...  Call for any questions or if we can be of service in any way...  Let's plan a follow up appt in 6 months w/ FASTING blood work at that time.Marland KitchenMarland Kitchen

## 2011-11-08 ENCOUNTER — Telehealth: Payer: Self-pay | Admitting: Pulmonary Disease

## 2011-11-08 MED ORDER — FUROSEMIDE 40 MG PO TABS: 40.0000 mg | ORAL_TABLET | Freq: Every day | ORAL | Status: DC

## 2011-11-08 NOTE — Telephone Encounter (Signed)
lmomtcb x1 

## 2011-11-08 NOTE — Telephone Encounter (Signed)
Per pt instruction from OV on 11-03-11 pt is to take lasix every morning. Pt states he needs refill on lasix. RX sent. Pt is aware.[name], CMA

## 2011-11-08 NOTE — Telephone Encounter (Signed)
Pt returned our call Larry Stone  °

## 2011-11-15 ENCOUNTER — Other Ambulatory Visit (HOSPITAL_COMMUNITY): Payer: Self-pay

## 2011-11-15 MED ORDER — CARVEDILOL 12.5 MG PO TABS: 12.5000 mg | ORAL_TABLET | Freq: Two times a day (BID) | ORAL | Status: DC

## 2011-11-15 NOTE — Telephone Encounter (Signed)
..   Requested Prescriptions   Signed Prescriptions Disp Refills  . carvedilol (COREG) 12.5 MG tablet 60 tablet 0    Sig: Take 1 tablet (12.5 mg total) by mouth 2 (two) times daily with a meal.    Authorizing Provider: Dolores Patty    Ordering User: Christella Hartigan, Lenny Fiumara Judie Petit

## 2011-12-10 ENCOUNTER — Other Ambulatory Visit: Payer: Self-pay | Admitting: Pulmonary Disease

## 2012-01-03 ENCOUNTER — Other Ambulatory Visit (HOSPITAL_COMMUNITY): Payer: Self-pay | Admitting: *Deleted

## 2012-01-03 MED ORDER — CARVEDILOL 12.5 MG PO TABS: 12.5000 mg | ORAL_TABLET | Freq: Two times a day (BID) | ORAL | Status: DC

## 2012-01-13 ENCOUNTER — Encounter: Payer: Self-pay | Admitting: Gastroenterology

## 2012-01-14 ENCOUNTER — Other Ambulatory Visit (HOSPITAL_COMMUNITY): Payer: Self-pay | Admitting: *Deleted

## 2012-01-14 ENCOUNTER — Other Ambulatory Visit: Payer: Self-pay | Admitting: Pulmonary Disease

## 2012-01-14 MED ORDER — CLOPIDOGREL BISULFATE 75 MG PO TABS: 75.0000 mg | ORAL_TABLET | Freq: Every day | ORAL | Status: DC

## 2012-01-14 MED ORDER — ATORVASTATIN CALCIUM 40 MG PO TABS: 40.0000 mg | ORAL_TABLET | Freq: Every day | ORAL | Status: DC

## 2012-01-25 ENCOUNTER — Telehealth: Payer: Self-pay | Admitting: Pulmonary Disease

## 2012-01-25 NOTE — Telephone Encounter (Signed)
Called, spoke with pt.  States he is trying to get cialis 5 mg filled but [facility] will only give him [facility] days even though he has a 30 day rx.  States they are giving him 2 options:  1.  Either get a PA done or 2.  Have dr change this to cialis 20 mg with 3 tablets per month. I do not see cialis on pt's current med list.  [facility]ed [facility], spoke with Shawna Orleans who states they do have a hand written rx from SN from 11/03/11 for cialis 5 mg take as directed # 30 x 3.  Pt's insurance is limiting this to 3 tablets in 31 days unless PA is done.  PA # is [id number].  I called this #, spoke with Onalee Hua.  He will have PA form faxed to triage.  Will await fax.  Pt aware.

## 2012-01-26 NOTE — Telephone Encounter (Signed)
cialis PA has been filled out and faxed back.  Waiting on approval or denial.

## 2012-01-27 NOTE — Telephone Encounter (Signed)
Denial received from the insurance company will follow up with SN on 10-11 to see what his recs are.

## 2012-01-31 MED ORDER — TADALAFIL 20 MG PO TABS: ORAL_TABLET | ORAL | Status: DC

## 2012-01-31 NOTE — Telephone Encounter (Signed)
PA has been denied for the cialis 5 mg daily  #30.  Called and spoke with pt and he is aware that this has been denied by insurance and he requested that the cialis 20 mg  #3 be sent to the pharmacy.  Per SN--ok to send this in.  Nothing further is needed.

## 2012-02-07 ENCOUNTER — Telehealth: Payer: Self-pay | Admitting: Pulmonary Disease

## 2012-02-07 MED ORDER — CLOTRIMAZOLE-BETAMETHASONE 1-0.05 % EX CREA: TOPICAL_CREAM | CUTANEOUS | Status: DC

## 2012-02-07 NOTE — Telephone Encounter (Signed)
I spoke with pt and he is wanting refill on lotrisone cream. This has been refilled for pt before. i have sent rx into the pharmacy. Nothing further was needed

## 2012-03-03 ENCOUNTER — Ambulatory Visit (INDEPENDENT_AMBULATORY_CARE_PROVIDER_SITE_OTHER): Payer: 59

## 2012-03-03 DIAGNOSIS — Z23 Encounter for immunization: Secondary | ICD-10-CM

## 2012-03-05 ENCOUNTER — Other Ambulatory Visit: Payer: Self-pay | Admitting: Pulmonary Disease

## 2012-03-06 ENCOUNTER — Other Ambulatory Visit (HOSPITAL_COMMUNITY): Payer: Self-pay | Admitting: *Deleted

## 2012-03-06 MED ORDER — SILDENAFIL CITRATE 100 MG PO TABS: 100.0000 mg | ORAL_TABLET | ORAL | Status: DC | PRN

## 2012-03-08 ENCOUNTER — Encounter: Payer: Self-pay | Admitting: Cardiovascular Disease

## 2012-03-08 ENCOUNTER — Ambulatory Visit (INDEPENDENT_AMBULATORY_CARE_PROVIDER_SITE_OTHER): Payer: 59 | Admitting: Cardiovascular Disease

## 2012-03-08 VITALS — BP 128/78 | HR 60 | Ht 70.0 in | Wt 206.0 lb

## 2012-03-08 DIAGNOSIS — I2510 Atherosclerotic heart disease of native coronary artery without angina pectoris: Secondary | ICD-10-CM

## 2012-03-08 MED ORDER — CARVEDILOL 12.5 MG PO TABS: 12.5000 mg | ORAL_TABLET | Freq: Two times a day (BID) | ORAL | Status: DC

## 2012-03-08 MED ORDER — NIACIN ER (ANTIHYPERLIPIDEMIC) 500 MG PO TBCR: 500.0000 mg | EXTENDED_RELEASE_TABLET | Freq: Four times a day (QID) | ORAL | Status: DC

## 2012-03-08 MED ORDER — ATORVASTATIN CALCIUM 40 MG PO TABS: 40.0000 mg | ORAL_TABLET | Freq: Every day | ORAL | Status: DC

## 2012-03-08 MED ORDER — NITROGLYCERIN 0.4 MG SL SUBL: 0.4000 mg | SUBLINGUAL_TABLET | SUBLINGUAL | Status: DC | PRN

## 2012-03-08 MED ORDER — FUROSEMIDE 40 MG PO TABS: 40.0000 mg | ORAL_TABLET | Freq: Every day | ORAL | Status: DC

## 2012-03-08 MED ORDER — POTASSIUM CHLORIDE 20 MEQ PO PACK: 20.0000 meq | PACK | Freq: Every day | ORAL | Status: DC

## 2012-03-08 MED ORDER — CLOPIDOGREL BISULFATE 75 MG PO TABS: 75.0000 mg | ORAL_TABLET | Freq: Every day | ORAL | Status: DC

## 2012-03-08 MED ORDER — AMLODIPINE BESYLATE 5 MG PO TABS: 5.0000 mg | ORAL_TABLET | Freq: Every day | ORAL | Status: DC

## 2012-03-08 NOTE — Progress Notes (Signed)
History of Present Illness: 61 yo male with history of coronary artery disease, HLD, HTN, OSA here today for cardiac follow up. He has been followed in the past by Dr. Gala Romney. 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 He also has residual 50% left main stenosis and nonobstructive disease elsewhere. Cardiac CT in 05/2009. LM < 50% LAD 50% LCX dominant nonobs RCA small no critical lesions. Small lung nodule. F/u CT showed stable pulm nodule. Carotid u/s 11/11 with minimal carotid plaque.   He is here today for follow up. He is doing well. Walks 2-4 miles[place]day on [place] and uses [employer]. No CP or SOB. BP well controlled. He has occasional dizziness. This is mostly when leaning over. He had some swelling in his right foot and ankle this summer and had negative venous dopplers.   Primary Care Physician: Alroy Dust  Last Lipid Profile:Lipid Panel     Component Value Date/Time   CHOL 131 05/13/2011 1010   TRIG 50.0 05/13/2011 1010   HDL 48.00 05/13/2011 1010   CHOLHDL 3 05/13/2011 1010   VLDL 10.0 05/13/2011 1010   LDLCALC 73 05/13/2011 1010     Past Medical History  Diagnosis Date  . Arteriosclerotic heart disease (ASHD)     --8/09: ETT normal --acute inferior lateral wall infarction in September 2004 treated medically.  --cardiac CT 2006  residual 50% left main stenosis and nonobstructive disease elsewhere. EF normal   --cardiac CT 05/2009. LM. <50% LAD. 50%  . HTN (hypertension)   . Hyperlipidemia   . Pharyngitis   . URI (upper respiratory infection)   . OSA (obstructive sleep apnea)   . Recurrent aspiration bronchitis/pneumonia   . Diabetes mellitus   . GERD (gastroesophageal reflux disease)   . Diverticulosis   . Colonic polyp   . DJD (degenerative joint disease)   . Low back pain syndrome   . Anxiety     Past Surgical History  Procedure Date  . Uvulopalatopharyngoplasty     surgery for OSA    Current Outpatient Prescriptions  Medication Sig  Dispense Refill  . ALPRAZolam (XANAX) 0.5 MG tablet Take 1 tablet (0.5 mg total) by mouth 3 (three) times daily as needed. for nerves.  90 tablet  5  . amLODipine (NORVASC) 5 MG tablet TAKE 1 TABLET BY MOUTH EVERY DAY  30 tablet  6  . ammonium lactate (LAC-HYDRIN) 12 % lotion Apply topically as directed.        Marland Kitchen atorvastatin (LIPITOR) 40 MG tablet Take 1 tablet (40 mg total) by mouth daily.  30 tablet  3  . b complex vitamins tablet Take 1 tablet by mouth daily.      Marland Kitchen buPROPion (WELLBUTRIN SR) 150 MG 12 hr tablet TAKE 1 TABLET BY MOUTH TWICE A DAY  60 tablet  5  . carvedilol (COREG) 12.5 MG tablet Take 1 tablet (12.5 mg total) by mouth 2 (two) times daily with a meal.  60 tablet  3  . cetirizine (ZYRTEC) 10 MG tablet Take 1 tablet (10 mg total) by mouth daily.  30 tablet  11  . chlorpheniramine-HYDROcodone (TUSSIONEX) 10-8 MG/5ML LQCR Take 5 mLs by mouth. oNLY WHEN SICK      . Cholecalciferol (VITAMIN D3) 2000 UNITS TABS Take 1 tablet by mouth daily.  30 tablet  11  . clopidogrel (PLAVIX) 75 MG tablet Take 1 tablet (75 mg total) by mouth daily.  30 tablet  3  . clotrimazole-betamethasone (LOTRISONE) cream Apply  as directed  45 g  prn  . Coenzyme Q10 (COQ10) 100 MG CAPS Take as directed  30 each  11  . cyanocobalamin 2000 MCG tablet Take 1 tablet (2,000 mcg total) by mouth daily.  30 tablet  11  . fish oil-omega-3 fatty acids 1000 MG capsule Take as directed  30 capsule  11  . furosemide (LASIX) 40 MG tablet Take 40 mg by mouth as needed.      Marland Kitchen KRILL OIL 1000 MG CAPS Take 1 capsule by mouth daily.      . Misc Natural Products (OSTEO BI-FLEX ADV TRIPLE ST) TABS Take as directed  60 tablet  11  . Multiple Vitamin (MULTIVITAMIN) tablet Take 1 tablet by mouth daily.  30 tablet  11  . niacin (NIASPAN) 500 MG CR tablet Take 500 mg by mouth 4 (four) times daily.        . nitroGLYCERIN (NITROSTAT) 0.4 MG SL tablet Place 0.4 mg under the tongue every 5 (five) minutes as needed. For up to 3 doses.         . potassium chloride (KLOR-CON) 20 MEQ packet Take 20 mEq by mouth daily.        . sildenafil (VIAGRA) 100 MG tablet Take 1 tablet (100 mg total) by mouth as needed for erectile dysfunction.  15 tablet  3  . Simethicone (GAS-X EXTRA STRENGTH) 125 MG CAPS Prn      . tadalafil (CIALIS) 20 MG tablet Take as directed  3 tablet  5  . TESTIM 50 MG/5GM GEL APPLY CONTENTS OF TWO 5 GM TUBES ONTO SKIN OF ARMS/SHOULDERS DAILY  300 g  5  . TRANSDERM-SCOP 1.5 MG APPLY 1 PATCH BEHIND EAR EVERY 3 DAYS  8 each  5  . [DISCONTINUED] furosemide (LASIX) 40 MG tablet Take 1 tablet (40 mg total) by mouth daily.  30 tablet  3    Allergies  Allergen Reactions  . Aspirin     REACTION: allergic to ASA w/ hives...  . Codeine   . Irbesartan     REACTION: allergic to ARBs w/ angioedema  . Ramipril     REACTION: Allergic to ACE's w/ angioedema...    History   Social History  . Marital Status: Married    Spouse Name: N/A    Number of Children: 1  . Years of Education: N/A   Occupational History  . Retired-Engineer [employer]    Social History Main Topics  . Smoking status: Former Smoker -- 1.0 packs/day for 20 years    Types: Cigarettes    Quit date: 05/08/1998  . Smokeless tobacco: Not on file     Comment: quit in 2005  . Alcohol Use: 0.5 oz/week    1 drink(s) per week     Comment: social drinker  . Drug Use: No  . Sexually Active: Not on file   Other Topics Concern  . Not on file   Social History Narrative  . No narrative on file    Family History  Problem Relation Age of Onset  . Colon cancer Father   . Stroke Mother   . Hypertension Sister   . Diabetes Sister     Review of Systems:  As stated in the HPI and otherwise negative.   BP 128/78  Pulse 60  Ht 5\' 10"  (1.778 m)  Wt 206 lb (93.441 kg)  BMI 29.56 kg/m2  Physical Examination: General: Well developed, well nourished, NAD HEENT: OP clear, mucus membranes moist SKIN: warm,  dry. No rashes. Neuro: No focal  deficits Musculoskeletal: Muscle strength 5/5 all ext Psychiatric: Mood and affect normal Neck: No JVD, no carotid bruits, no thyromegaly, no lymphadenopathy. Lungs:Clear bilaterally, no wheezes, rhonci, crackles Cardiovascular: Regular rate and rhythm. No murmurs, gallops or rubs. Abdomen:Soft. Bowel sounds present. Non-tender.  Extremities: No lower extremity edema. Pulses are 2 + in the bilateral DP/PT.  EKG: NSR, rate 60 bpm. RBBB. Old inferior MI with lateral involvement.   Assessment and Plan:  1. CAD: Doing well. No evidence of ischemia. Continue current regimen. Will arrange screening treadmill stress test in the spring.   2. HYPERTENSION:  Blood pressure well controlled. Continue current regimen.   3. HYPERLIPIDEMIA: LDL at goal. Continue current regimen.

## 2012-03-08 NOTE — Patient Instructions (Addendum)
Your physician wants you to follow-up in:  12 months.  You will receive a reminder letter in the mail two months in advance. If you don't receive a letter, please call our office to schedule the follow-up appointment.   

## 2012-03-10 ENCOUNTER — Telehealth: Payer: Self-pay | Admitting: *Deleted

## 2012-03-10 DIAGNOSIS — I2510 Atherosclerotic heart disease of native coronary artery without angina pectoris: Secondary | ICD-10-CM

## 2012-03-10 NOTE — Telephone Encounter (Signed)
Pt discussed having a treadmill in the spring at last office visit with Dr. Clifton James.  I spoke with pt and he would like to do in March. Will have scheduling contact pt on Monday to schedule appt.  I verbally reviewed instructions for stress test with pt and will mail him copy once appt scheduled.

## 2012-03-31 ENCOUNTER — Other Ambulatory Visit: Payer: Self-pay | Admitting: Pulmonary Disease

## 2012-04-04 ENCOUNTER — Telehealth: Payer: Self-pay | Admitting: Pulmonary Disease

## 2012-04-04 NOTE — Telephone Encounter (Signed)
Spoke with pharmacy and they verified that rx for Xanax has already been received.

## 2012-04-19 DIAGNOSIS — D126 Benign neoplasm of colon, unspecified: Secondary | ICD-10-CM

## 2012-04-19 HISTORY — DX: Benign neoplasm of colon, unspecified: D12.6

## 2012-05-15 ENCOUNTER — Encounter: Payer: Self-pay | Admitting: Gastroenterology

## 2012-05-15 ENCOUNTER — Ambulatory Visit (INDEPENDENT_AMBULATORY_CARE_PROVIDER_SITE_OTHER): Payer: 59 | Admitting: Pulmonary Disease

## 2012-05-15 ENCOUNTER — Ambulatory Visit (INDEPENDENT_AMBULATORY_CARE_PROVIDER_SITE_OTHER)
Admission: RE | Admit: 2012-05-15 | Discharge: 2012-05-15 | Disposition: A | Discharge status: Home / Self Care | Payer: 59 | Source: Ambulatory Visit | Attending: Pulmonary Disease | Admitting: Pulmonary Disease

## 2012-05-15 ENCOUNTER — Other Ambulatory Visit (INDEPENDENT_AMBULATORY_CARE_PROVIDER_SITE_OTHER): Payer: 59

## 2012-05-15 ENCOUNTER — Encounter: Payer: Self-pay | Admitting: Pulmonary Disease

## 2012-05-15 VITALS — BP 120/80 | HR 60 | Temp 96.9°F | Ht 70.0 in | Wt 214.2 lb

## 2012-05-15 DIAGNOSIS — G4733 Obstructive sleep apnea (adult) (pediatric): Secondary | ICD-10-CM

## 2012-05-15 DIAGNOSIS — K5730 Diverticulosis of large intestine without perforation or abscess without bleeding: Secondary | ICD-10-CM

## 2012-05-15 DIAGNOSIS — I10 Essential (primary) hypertension: Secondary | ICD-10-CM

## 2012-05-15 DIAGNOSIS — I2510 Atherosclerotic heart disease of native coronary artery without angina pectoris: Secondary | ICD-10-CM

## 2012-05-15 DIAGNOSIS — M1990 Unspecified osteoarthritis, unspecified site: Secondary | ICD-10-CM

## 2012-05-15 DIAGNOSIS — M545 Low back pain: Secondary | ICD-10-CM

## 2012-05-15 DIAGNOSIS — E785 Hyperlipidemia, unspecified: Secondary | ICD-10-CM

## 2012-05-15 DIAGNOSIS — E538 Deficiency of other specified B group vitamins: Secondary | ICD-10-CM

## 2012-05-15 DIAGNOSIS — R413 Other amnesia: Secondary | ICD-10-CM

## 2012-05-15 DIAGNOSIS — I872 Venous insufficiency (chronic) (peripheral): Secondary | ICD-10-CM

## 2012-05-15 DIAGNOSIS — J42 Unspecified chronic bronchitis: Secondary | ICD-10-CM

## 2012-05-15 DIAGNOSIS — F411 Generalized anxiety disorder: Secondary | ICD-10-CM

## 2012-05-15 DIAGNOSIS — E291 Testicular hypofunction: Secondary | ICD-10-CM

## 2012-05-15 DIAGNOSIS — R7309 Other abnormal glucose: Secondary | ICD-10-CM

## 2012-05-15 DIAGNOSIS — J984 Other disorders of lung: Secondary | ICD-10-CM

## 2012-05-15 DIAGNOSIS — K219 Gastro-esophageal reflux disease without esophagitis: Secondary | ICD-10-CM

## 2012-05-15 DIAGNOSIS — D126 Benign neoplasm of colon, unspecified: Secondary | ICD-10-CM

## 2012-05-15 LAB — HEPATIC FUNCTION PANEL
Alkaline Phosphatase: 84 U/L (ref 39–117)
Bilirubin, Direct: 0.2 mg/dL (ref 0.0–0.3)
Total Protein: 6.8 g/dL (ref 6.0–8.3)

## 2012-05-15 LAB — LIPID PANEL
Cholesterol: 114 mg/dL (ref 0–200)
HDL: 44.6 mg/dL (ref >39.00)
LDL Cholesterol: 63 mg/dL (ref 0–99)
Total CHOL/HDL Ratio: 3
Triglycerides: 31 mg/dL (ref 0.0–149.0)
VLDL: 6.2 mg/dL (ref 0.0–40.0)

## 2012-05-15 LAB — CBC WITH DIFFERENTIAL/PLATELET
Basophils Absolute: 0 10*3/uL (ref 0.0–0.1)
Eosinophils Absolute: 0.6 10*3/uL (ref 0.0–0.7)
HCT: 46.1 % (ref 39.0–52.0)
Hemoglobin: 15.7 g/dL (ref 13.0–17.0)
Lymphocytes Relative: 22.9 % (ref 12.0–46.0)
Lymphs Abs: 2 10*3/uL (ref 0.7–4.0)
MCHC: 34 g/dL (ref 30.0–36.0)
MCV: 94 fl (ref 78.0–100.0)
Monocytes Absolute: 1 10*3/uL (ref 0.1–1.0)
Neutro Abs: 5.1 10*3/uL (ref 1.4–7.7)
RDW: 13.1 % (ref 11.5–14.6)

## 2012-05-15 LAB — PSA: PSA: 0.39 ng/mL (ref 0.10–4.00)

## 2012-05-15 LAB — BASIC METABOLIC PANEL
CO2: 29 mEq/L (ref 19–32)
Calcium: 9.3 mg/dL (ref 8.4–10.5)
Chloride: 104 mEq/L (ref 96–112)
Sodium: 140 mEq/L (ref 135–145)

## 2012-05-15 LAB — VITAMIN B12: Vitamin B-12: 1005 pg/mL — ABNORMAL HIGH (ref 211–911)

## 2012-05-15 LAB — TSH: TSH: 1.93 u[IU]/mL (ref 0.35–5.50)

## 2012-05-15 NOTE — Progress Notes (Signed)
Subjective:    Patient ID: Larry Stone, male    DOB: 06/24/1950, 62 y.o.   MRN: [id number]  HPI 62 y/o WM here for a followup visit... he has multiple medical problems as noted below...  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  ~  June 18, 2010:  Add-on at pt request for mult questions> he wants me to know that he just feels terrible & that his "quality of life is going down over the last yr";  he notes that he exercises at the gym regularly & the trainer can't understand why he isn't gaining muscle, and he can't get rid of his gut despite watching his diet, & he notes sexual dysfunction, etc... he has checked everything on the internet & he wants to try TESTIM Gel for 17mo to see if this makes a diff for him;  recent labs w/ Testos level = 364 (350-890) & so we will go along w/ his desire for this trial> offered consideration of other meds, and offered Urology referral for second opinion & poss Testos shots but he declines & wants to try what he has researched>  we wrote Rx for TESTIM Gel 5gm tubes- start w/ one tube rubbed into skin of arms/ shoulders dailt for several weeks & incr to 2 tubes thereafter (he says his insurance will pay for up to 3 tubes per day)... he is rec to ret for Testos level in ~17months (he will call when ready).  ~  December 08, 2010:  17mo ROV & he tells me he is feeling great "couldn't be better"; he's been traveling, had "every vaccination known to man" at travel clinic but didn't bring card or list for [place] to review & place on his chart; notes that he is still on the TESTIM 2 of the 5gm tubes rubbed into skin daily but he oddly does not credit this intervention w/ his new found well-being etc;  He never returned for f/u Testosterone level on the gel & currently feels well enough that he doesn't care what the level is> he's going to continue current med RX no matter  what...    He saw DrBensimhon for Cards f/u 5/12 w/ CAD- s/p IWMI 2004 w/ resid 50% Lmain & nonobstructive dis elsewhere> he had Cardiac CT 2/11 w/ LM<50%, LAD50%, nonobstructive dis elsewhere;  NOTE small lung nodule, f/u CT stable, no change;  Doing well, exercising, no CP, palpit, SOB, etc; no changes made...    We reviewed meds, prev labs & XRays... EKG 5/12 showed NSR, rate 64, RBBB, poss infer infarct age undetermined...  ~  May 13, 2011:  21mo ROV & here for CPX> he reports a good interval & his only new concern is his Memory (see below)- I offered Neuro eval & work up but he declines at this time; he had the 2012 flu vaccine 10/12; he wants Rx for Shingles vaccine as well; he requests Rx for all OTC meds for his Flex spending acct...  We discussed f/u CXR & Fasting blood work today> see prob list below>> CXR 1/13 showed normal heart size, lungs clear w/ old healed right rib fx, NAD... LABS 1/13:  FLP- at goals on Lip40;  Chems- wnl & A1c=6.1;  CBC- wnl;  TSH=1.82;  PSA=0.48;  Testos=363;  UA= clear...  ~  November 03, 2011:  17mo ROV & [name] continues on an impressive med list  including lots of supplements that he wants prescriptions written for his flex spending acct... He's been traveling to [place], [place], [place] & doing satis> denies CP, palpit, dizzy, SOB, edema, etc... He is concerned however about LE edema, took his Lasix daily but didn't notice much change "it's really got me concerned"> we discussed VI & the need for low sodium, elevation, support hose, Lasix rx, & we will check Ven Dopplers for completeness...  He is also concerned about the cost of Viagra & wants to try Cialis.    We reviewed prob list, meds, xrays and labs> see below>> Venous Dopplers of LE's 7/13 showed neg for DVT... I told him next step was to refer to a vein clinic...  ~  May 15, 2012:  99mo ROV & [name] has had a good 99mo he says- no new complaints or concerns; he has an impressive list of meds...     OSA> uses  Transderm scope prn dizziness, uses "breathe right nasal strips" periodically; resting OK, no daytime hypersomnolence...    HxBronchitis> on Zyrtek, Tussionex; doing satis w/o cough, phlegm, etc...    HBP> on Coreg 12.5Bid, Amlod5, Lasix40 prn, K20/d; BP= 120/80 & he denies interval CP, palpit, SOB, edema...    CAD, RBBB> on Plavix75; he saw DrMcAlhany 11/13> CAD w/ AMI 2004 & residual 50% Lmain; stable, exercising regularly, no angina & no changes made...    Hyperlipid> on Lip40, Niasp500, FishOil, KrillOil, CoQ10; FLP shows TChol 114, TG 31, HDL 45, LDL 63    DM> on diet alone; wt= 214# & FBS= 112...    GI- GERD, Divetrtics, Polyps> on Simethacone for gas; father died of colon cancer age 96; last colonoscopy 10/07 by Arlyce Dice & showed divertics only- last polyp removed 2004- he is overdue.    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    DJD, LBP> on Osteobiflex, MVI, VitD5000; stable & exercising regularly...    Memory Loss, Anxiety> on Wellbutrin & Alpraz...    ?Hx Vit B12 defic> on B12 po daily (dosed on his own); B12 level = 1005 & ok to decr B12 supplement to 1081mcg/d...    Anxiety> on WellbutrinSR 150Bid, Xanax 0,5mg  prn;  We reviewed prob list, meds, xrays and labs> see below for updates >> he had the 2013 Flu vaccine 11/13 CXR 1/14 showed normal heart size, clear lungs, chr right rib deformity (old fx), NAD... LABS 1/14:  FLP- at goals on Lip40+Niasp;  Chems- wnl;  CBC- wnl;  TSH=1.93;  PSA=0.39;  Testos=307 (350-890) on Testim 2tubes/d;  B12=1005 (211-911) on B12 2018mcg/d...            Problem List:     ALLERGIC to ASA w/ hives, & ACE inhibitors/ ARBs too w/ angioedema.  OBSTRUCTIVE SLEEP APNEA (ICD-327.23) - s/p UPPP surgery in 2004 by DrRedman...  states he's doing OK, denies snoring, no daytime hypersom, wife not complaining... uses ZYRTEK for allergy  symtoms. ~  1/13: He has started using the "breathe right nasal strips" qhs for recurrent snoring; still says he rests well, denies daytime hypersomnolence, & declines offer for repeat sleep study to check...  Hx of BRONCHITIS, RECURRENT (ICD-491.9) - he is an ex-smoker, prev 1 ppd for 36yrs, quit in 2004. ~  Hx tiny RML nodule seen on Cardiac CT 2/11 & apparently unchanged from old CTChest 2004... ~  10/11: f/u CT Chest for f/u tiny 3mm nodule  RML- unchanged xyrs & benign, mild centilob emphysema, +coronary calcif, NAD... ~  1/13: he denies breathing problems, intercurrent infections, etc... ~  1/14:  CXR 1/14 showed normal heart size, clear lungs, chr right rib deformity (old fx), NAD.   HYPERTENSION (ICD-401.9) - on COREG 12.5mg Bid, AMLODIPINE 5mg /d, & LASIX 40mg - only as needed (& he hasn't needed any), + KCl daily ("DrBensimhon said to take it every day")... incr stress w/ mother's stroke and he's the care giver...  ~  8/12: BP= 118/84, taking meds regularly & tol well> denies HA, visual changes, CP, palipit, dizziness, syncope, dyspnea, edema, etc... ~  1/13: BP= 120/82 & he remains asymptomatic... ~  1/14: on Coreg 12.5Bid, Amlod5, Lasix40 prn, K20/d; BP= 120/80 & he denies interval CP, palpit, SOB, edema.  ARTERIOSCLEROTIC HEART DISEASE (ICD-414.00) - on PLAVIX 75mg /d, allergic to ASA... followed by DrBensimhon and seen regularly...  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 ~  f/u Cardiac CT 2/11 showed>  IMPRESSION: Calcified plaque in the left main with < 50% stenosis.  Calcified plaque in the proximal and mid LAD with at most moderate (around 50%) stenosis in the mid LAD.  The CFX is dominant, supplying the left PDA.  The AV CFX appears patent with mild stenosis at most. There are 2 moderate, closely spaced obtuse marginal vessels that have calcified plaque but are patent.  They are not seen well enough to comment on degree of stenosis  within the vessels.  The RCA is small and nondominant. ~  Vasc Screen per Cards 10/11 showed mild carotid plaques, norm AbdAo & ABIs... ~  1/13:  He denies CP, palpit, SOB, etc; he exercises by walking regularly & using the bowflex. ~  7/13: Venous Dopplers are neg- no evid DVT etc... ~  11/13: he had Cards f/u DrMcAlhany> CAD s/p AMI (inferolat) 2004, residual 50% Lmain, HBP; doing well- walking, bowflex, etc; no angina; no changes made;  EKG 11/13 showed NSR, rate60, RBBB, inferior & lat scars, NAD...  HYPERLIPIDEMIA (ICD-272.4) - on LIPITOR 40mg /d, NIASPAN 500Qid, FISH OIL daily... ~  FLP3/09 showed TChol 117, TG 46, HDL 38, LDL 70 ~  FLP 7/10 > pt never ret for fasting labs after this visit. ~  FLP 2/11 showed TChol 123, TG 56, HDL 46, LDL 66 ~  FLP 11/11 showed TChol 114, TG 52, HDL 39, LDL 64... Continue Lip40, Niasp4/d, & FishOil. ~  FLP 1/13 showed TChol 131, TG 50, HDL 48, LDL 73 ~  1/14: on Lip40, ZOXWR604, FishOil, KrillOil, CoQ10; FLP shows TChol 114, TG 31, HDL 45, LDL 63  DIABETES MELLITUS, BORDERLINE (ICD-790.29) - on diet alone... ~  labs 3/09 (wt=225#) showed BS= 114, HgA1c= 6.2 ~  labs 7/10 (wt=229#) > pt never ret for fasting labs after this ov. ~  labs 2/11 (wt=206#) showed BS= 93, A1c= 5.9 ~  labs 2/12 (wt=214#) showed BS= 84, A1c= 6.1.Marland KitchenMarland Kitchen Needs to lose wt. ~  Labs 1/13 (wt=219#) showed BS= 109, A1c= 6.1 ~  1/14: on diet alone; wt= 214# & FBS= 112.  GERD (ICD-530.81) - he uses H2 blockers as needed.  DIVERTICULOSIS OF COLON (ICD-562.10) & COLONIC POLYPS (ICD-211.3) - his last polyps were ~32mm and removed in 2004= adenomatous... last colonoscopy 10/07 by Dorris Singh done while on his Plavix therapy showed divertics only... f/u planned 47yrs... there is a +fam hx of colon cancer in his father who died at age 33... (he tells me he would prefer  to have colonoscopies every 102yrs). ~  f/u colon due 10/12>  We will refer chart to DrKaplan...  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 ~  Labs 2/12 showed Testos level = 364;  PSA= 0.43... ~  3/12:  Add-on appt at pt request for mult questions> he wants me to know that he feels terrible & that his "quality of life is going down over the last yr";  he notes that he exercises at the gym regularly & the trainer can't understand why he isn't gaining muscle, and he can't get rid of his gut despite watching his diet, & he notes sexual dysfunction, etc; he has checked everything on the internet & he wants to try TESTIM Gel for 73mo to see if this makes a diff for him; recent labs w/ Testos level = 364 (350-890) & so we will go along w/ his desire for this trial> offered consideration of other meds, and offered Urology referral for second opinion & poss Testos shots but he declines & wants to try what he has researched>  we wrote Rx for TESTIM Gel 5gm tubes- start w/ one tube rubbed into skin of arms/ shoulders dailt for several weeks & incr to 2 tubes thereafter (he says his insurance will pay for up to 3 tubes per day)... he is rec to ret for Testos level in ~18months (he will call when ready- he never did). ~  8/12:  He states he is feeling much better, still on the TESTIM 2 of the 5gm tubes rubbed into skin daily, but he oddly does not credit this intervention w/ his new found well-being etc;  He never returned for f/u Testosterone level on the gel & currently feels well enough that he doesn't care what the level is> he's going to continue current med RX no matter what... ~  Labs 1/13 ?on Testim 2 tubes daily? showed Testosterone level = 363 (no change);  PSA= 0.48... ~  1/14: on Testim 2tubes daily, +Viagra&Cialis as needed; labs showed PSA=0.39 and Testos level = 307 with him feeling well, good energy/ libido/ etc.  DEGENERATIVE JOINT DISEASE (ICD-715.90) - he reports doing fair- mostly c/o knees & hands ("I messed up my knee in a fall")... he saw Ortho ?who? on Glucosamine/ Chondroitin  supplements.  LOW BACK PAIN SYNDROME (ICD-724.2)  MEMORY LOSS >>  ~  1/13: he states that his "memory is fuzzy" & "Clarity is missing"; occas he will swear that he said something but he really didn't; he's had prev scans etc & I have recommended a Neurology eval for his memory but he declines at this time "I'll think about it" he said...  ANXIETY (ICD-300.00) - on WELLBUTRIN 150mg Bid, & ALPRAZOLAM 0.5mg  Prn... increased stress w/ mother's stroke last year... he's the main care giver w/ 2 sisters who don't help much...   Health Maintenance: ~  GI:  due for f/u colonoscopy 10/12... ~  GU:  DRE is neg, PSA=0.43, Testos= 364 (350-890), on Viagra Prn... ~  Immuniztions:  he is encouraged to get the yearly Flu vaccine... given PNEUMOVAX 2/12... He reports a lot of travel & he's been to the travel clinic & had "every vaccine known to man"...   Past Surgical History  Procedure Date  . Uvulopalatopharyngoplasty     surgery for OSA    Outpatient Encounter Prescriptions as of 05/15/2012  Medication Sig Dispense Refill  . ALPRAZolam (XANAX) 0.5 MG tablet TAKE 1 TABLET BY MOUTH  3 TIMES A DAY AS NEEDED  90 tablet  1  . amLODipine (NORVASC) 5 MG tablet Take 1 tablet (5 mg total) by mouth daily.  30 tablet  6  . ammonium lactate (LAC-HYDRIN) 12 % lotion Apply topically as directed.        Marland Kitchen atorvastatin (LIPITOR) 40 MG tablet Take 1 tablet (40 mg total) by mouth daily.  30 tablet  6  . b complex vitamins tablet Take 1 tablet by mouth daily.      Marland Kitchen buPROPion (WELLBUTRIN SR) 150 MG 12 hr tablet TAKE 1 TABLET BY MOUTH TWICE A DAY  60 tablet  5  . carvedilol (COREG) 12.5 MG tablet Take 1 tablet (12.5 mg total) by mouth 2 (two) times daily with a meal.  60 tablet  6  . cetirizine (ZYRTEC) 10 MG tablet Take 1 tablet (10 mg total) by mouth daily.  30 tablet  11  . chlorpheniramine-HYDROcodone (TUSSIONEX) 10-8 MG/5ML LQCR Take 5 mLs by mouth. oNLY WHEN SICK      . Cholecalciferol (VITAMIN D3) 2000 UNITS TABS  Take 1 tablet by mouth daily.  30 tablet  11  . clopidogrel (PLAVIX) 75 MG tablet Take 1 tablet (75 mg total) by mouth daily.  30 tablet  6  . clotrimazole-betamethasone (LOTRISONE) cream Apply as directed  45 g  prn  . Coenzyme Q10 (COQ10) 100 MG CAPS Take as directed  30 each  11  . cyanocobalamin 2000 MCG tablet Take 1 tablet (2,000 mcg total) by mouth daily.  30 tablet  11  . fish oil-omega-3 fatty acids 1000 MG capsule Take as directed  30 capsule  11  . furosemide (LASIX) 40 MG tablet Take 1 tablet (40 mg total) by mouth daily.  30 tablet  6  . KRILL OIL 1000 MG CAPS Take 1 capsule by mouth daily.      . Misc Natural Products (OSTEO BI-FLEX ADV TRIPLE ST) TABS Take as directed  60 tablet  11  . Multiple Vitamin (MULTIVITAMIN) tablet Take 1 tablet by mouth daily.  30 tablet  11  . niacin (NIASPAN) 500 MG CR tablet Take 1 tablet (500 mg total) by mouth 4 (four) times daily.  120 tablet  6  . nitroGLYCERIN (NITROSTAT) 0.4 MG SL tablet Place 1 tablet (0.4 mg total) under the tongue every 5 (five) minutes as needed. For up to 3 doses.  25 tablet  6  . potassium chloride (KLOR-CON) 20 MEQ packet Take 20 mEq by mouth daily.  30 packet  6  . sildenafil (VIAGRA) 100 MG tablet Take 1 tablet (100 mg total) by mouth as needed for erectile dysfunction.  15 tablet  3  . Simethicone (GAS-X EXTRA STRENGTH) 125 MG CAPS Prn      . tadalafil (CIALIS) 20 MG tablet Take as directed  3 tablet  5  . TESTIM 50 MG/5GM GEL APPLY CONTENTS OF TWO 5 GM TUBES ONTO SKIN OF ARMS/SHOULDERS DAILY  300 g  5  . TRANSDERM-SCOP 1.5 MG APPLY 1 PATCH BEHIND EAR EVERY 3 DAYS  8 each  5    Allergies  Allergen Reactions  . Aspirin     REACTION: allergic to ASA w/ hives...  . Codeine   . Irbesartan     REACTION: allergic to ARBs w/ angioedema  . Ramipril     REACTION: Allergic to ACE's w/ angioedema...    Current Medications, Allergies, Past Medical History, Past Surgical History, Family History, and Social History were  reviewed  in [employer] record.    Review of Systems       See HPI - all other systems neg except as noted...       The patient complains of dyspnea on exertion and muscle weakness.  The patient denies anorexia, fever, weight loss, weight gain, vision loss, decreased hearing, hoarseness, chest pain, syncope, peripheral edema, prolonged cough, headaches, hemoptysis, abdominal pain, melena, hematochezia, severe indigestion/heartburn, hematuria, incontinence, suspicious skin lesions, transient blindness, difficulty walking, depression, unusual weight change, abnormal bleeding, enlarged lymph nodes, and angioedema.     Objective:   Physical Exam    WD, sl overweight, 62 y/o WM in NAD... GENERAL:  Alert & oriented; pleasant & cooperative... HEENT:  [place]/AT, EOM-wnl, PERRLA, EACs-clear, TMs-wnl, NOSE-clear, THROAT-s/p UPPP surg... NECK:  Supple w/ fairROM; no JVD; normal carotid impulses w/o bruits; no thyromegaly or nodules palpated; no lymphadenopathy. CHEST:  Clear to P & A; without wheezes/ rales/ or rhonchi. HEART:  Regular Rhythm; without murmurs/ rubs/ or gallops. ABDOMEN:  Soft & nontender; normal bowel sounds; no organomegaly or masses detected. EXT: without deformities, mild arthritic changes; no varicose veins/ venous insuffic/ or edema. Neuro:  intact w/o focal abn detected... DERM:  No lesions noted; no rash etc...  RADIOLOGY DATA:  Reviewed in the EPIC EMR & discussed w/ the patient...   LABORATORY DATA:  Reviewed in the EPIC EMR & discussed w/ the patient...     Assessment & Plan:    Hx OSA, s/p UPPP in 2004>  noted recurrent snoring & using breathe right nasal strips; he doesn't want repeat sleep study or further eval, just want Rx for the strips so he can pay for them w/ his flex acct...  Hx recurrent bronchitis>  He quit smoking in 2004 & no recent bronchitic exacerbations...  HBP>  Controlled on BBlocker, CCB, Diuretic; continue same...  CAD>   Followed by Lawson Fiscal  on above + Plavix, he is allergic to ASA...  Ven Insuffic, Edema>  We reviewed the pathophysiology & the need for no salt, elevation, support hse, Lasix40;  ven dopplers (7/13) were neg/ normal/ no DVT...  HYPERLIPIDEMIA>  On Liptor & Niaspan w/ good numbers at goal...  Borderline DM>  On diet alone & doing well...  GI> GERD, Divertics, Polyps>  Stable, f/u colon is now overdue & we will refer chart to [facility] for review...  DJD/ LBP>  He treats self w/ OTC meds...  ANXIETY>  On Wellbutrin & Alprazolam, stable & he wants to continue the same...   Patient's Medications  New Prescriptions   No medications on file  Previous Medications   ALPRAZOLAM (XANAX) 0.5 MG TABLET    TAKE 1 TABLET BY MOUTH 3 TIMES A DAY AS NEEDED   AMLODIPINE (NORVASC) 5 MG TABLET    Take 1 tablet (5 mg total) by mouth daily.   AMMONIUM LACTATE (LAC-HYDRIN) 12 % LOTION    Apply topically as directed.     ATORVASTATIN (LIPITOR) 40 MG TABLET    Take 1 tablet (40 mg total) by mouth daily.   B COMPLEX VITAMINS TABLET    Take 1 tablet by mouth daily.   CARVEDILOL (COREG) 12.5 MG TABLET    Take 1 tablet (12.5 mg total) by mouth 2 (two) times daily with a meal.   CETIRIZINE (ZYRTEC) 10 MG TABLET    Take 1 tablet (10 mg total) by mouth daily.   CHLORPHENIRAMINE-HYDROCODONE (TUSSIONEX) 10-8 MG/5ML LQCR    Take 5 mLs by mouth. oNLY WHEN  SICK   CHOLECALCIFEROL (VITAMIN D3) 5000 UNITS CAPS    Take one tablet by mouth daily   CLOPIDOGREL (PLAVIX) 75 MG TABLET    Take 1 tablet (75 mg total) by mouth daily.   CLOTRIMAZOLE-BETAMETHASONE (LOTRISONE) CREAM    Apply as directed   COENZYME Q10 (COQ10) 100 MG CAPS    Take as directed   CYANOCOBALAMIN 2000 MCG TABLET    Take 1 tablet (2,000 mcg total) by mouth daily.   FISH OIL-OMEGA-3 FATTY ACIDS 1000 MG CAPSULE    Take as directed   FUROSEMIDE (LASIX) 40 MG TABLET    Take 1 tablet (40 mg total) by mouth daily.   KRILL OIL 1000 MG CAPS    Take 1 capsule by  mouth daily.   MISC NATURAL PRODUCTS (OSTEO BI-FLEX ADV TRIPLE ST) TABS    Take as directed   MULTIPLE VITAMIN (MULTIVITAMIN) TABLET    Take 1 tablet by mouth daily.   NIACIN (NIASPAN) 500 MG CR TABLET    Take 1 tablet (500 mg total) by mouth 4 (four) times daily.   NITROGLYCERIN (NITROSTAT) 0.4 MG SL TABLET    Place 1 tablet (0.4 mg total) under the tongue every 5 (five) minutes as needed. For up to 3 doses.   POTASSIUM CHLORIDE (KLOR-CON) 20 MEQ PACKET    Take 20 mEq by mouth daily.   SILDENAFIL (VIAGRA) 100 MG TABLET    Take 1 tablet (100 mg total) by mouth as needed for erectile dysfunction.   SIMETHICONE (GAS-X EXTRA STRENGTH) 125 MG CAPS    Prn   TADALAFIL (CIALIS) 20 MG TABLET    Take as directed   TESTIM 50 MG/5GM GEL    APPLY CONTENTS OF TWO 5 GM TUBES ONTO SKIN OF ARMS/SHOULDERS DAILY   TRANSDERM-SCOP 1.5 MG    APPLY 1 PATCH BEHIND EAR EVERY 3 DAYS  Modified Medications   Modified Medication Previous Medication   BUPROPION (WELLBUTRIN SR) 150 MG 12 HR TABLET buPROPion (WELLBUTRIN SR) 150 MG 12 hr tablet      TAKE 1 TABLET BY MOUTH TWICE A DAY    TAKE 1 TABLET BY MOUTH TWICE A DAY  Discontinued Medications   CHOLECALCIFEROL (VITAMIN D3) 2000 UNITS TABS    Take 1 tablet by mouth daily.

## 2012-05-15 NOTE — Patient Instructions (Addendum)
Today we updated your med list in our EPIC system...    Continue your current medications the same...  Today we did your follow up CXR & FASTING blood work...    We will contact you w/ the results when avail...  Stay as active as possible...  Call for any questions...  Let's plan a follow up recheck in 6 months.Marland KitchenMarland Kitchen

## 2012-05-21 ENCOUNTER — Other Ambulatory Visit: Payer: Self-pay | Admitting: Pulmonary Disease

## 2012-06-15 ENCOUNTER — Encounter: Payer: Self-pay | Admitting: *Deleted

## 2012-06-15 NOTE — Progress Notes (Signed)
Patient ID: Larry Stone, male   DOB: Mar 10, 1951, 62 y.o.   MRN: [id number] Pt is taking Plavix.  Scheduled for recall colonoscopy with Dr. Arlyce Dice 3/13.  Talked with pt and scheduled new pt appt with Mike Gip, PA on Monday 3/10 at 8:30.  Pt was not able to make 8:30 appt any earlier than 3/10.  Pt wants to reschedule colon for a later date than 3/13.  He says he will make appt when he comes for office visit with Amy.

## 2012-06-19 ENCOUNTER — Encounter: Payer: 59 | Admitting: Cardiovascular Disease

## 2012-06-19 ENCOUNTER — Other Ambulatory Visit: Payer: Self-pay | Admitting: Pulmonary Disease

## 2012-06-19 DIAGNOSIS — E785 Hyperlipidemia, unspecified: Secondary | ICD-10-CM

## 2012-06-19 DIAGNOSIS — D126 Benign neoplasm of colon, unspecified: Secondary | ICD-10-CM

## 2012-06-26 ENCOUNTER — Ambulatory Visit (INDEPENDENT_AMBULATORY_CARE_PROVIDER_SITE_OTHER): Payer: 59 | Admitting: Physician Assistant

## 2012-06-26 ENCOUNTER — Encounter: Payer: Self-pay | Admitting: Physician Assistant

## 2012-06-26 VITALS — BP 102/62 | HR 70 | Ht 69.0 in | Wt 219.2 lb

## 2012-06-26 DIAGNOSIS — Z7901 Long term (current) use of anticoagulants: Secondary | ICD-10-CM

## 2012-06-26 DIAGNOSIS — Z86010 Personal history of colonic polyps: Secondary | ICD-10-CM

## 2012-06-26 MED ORDER — MOVIPREP 100 G PO SOLR: 1.0000 | Freq: Once | ORAL | Status: AC

## 2012-06-26 NOTE — Progress Notes (Signed)
Reviewed and agree with management. Robert D. Kaplan, M.D., FACG  

## 2012-06-26 NOTE — Progress Notes (Signed)
Subjective:    Patient ID: Larry Stone, male    DOB: 27-Dec-1950, 62 y.o.   MRN: [id number]  HPI  62 year old white male known to Dr. Arlyce Dice from prior colonoscopies. Patient has history of adenomatous colon polyps on: 2004. He had followup in October of 2007 with no recurrent polyps but does have sigmoid diverticulosis. He comes in today to schedule followup colonoscopy. Patient does have history of coronary artery disease is status post MI in 2004 and has been maintained on chronic Plavix. He also has history of hypertension hyperlipidemia adult-onset diabetes mellitus and sleep apnea. He is currently establish with Dr. Sanjuana Kava for cardiology and says that he is supposed to have  stress testing done for routine followup in a couple of weeks. He has no current GI complaints, specifically no regular heartburn indigestion dysphagia or odynophagia. He denies any problems with abdominal pain changes in bowel habits melena or hematochezia. Family history is positive for colon cancer in his father  He states that he is very reluctant to come  off of Plavix because he was critically ill at the time of his MI, which was managed medically. Review of his record showed that he does have residual LAD disease.    Review of Systems  Constitutional: Negative.   HENT: Negative.   Eyes: Negative.   Respiratory: Negative.   Cardiovascular: Negative.   Gastrointestinal: Negative.   Endocrine: Negative.   Genitourinary: Negative.   Allergic/Immunologic: Negative.   Neurological: Negative.   Hematological: Negative.   Psychiatric/Behavioral: Negative.    Outpatient Prescriptions Prior to Visit  Medication Sig Dispense Refill  . ALPRAZolam (XANAX) 0.5 MG tablet TAKE 1 TABLET BY MOUTH 3 TIMES A DAY AS NEEDED  90 tablet  1  . amLODipine (NORVASC) 5 MG tablet Take 1 tablet (5 mg total) by mouth daily.  30 tablet  6  . ammonium lactate (LAC-HYDRIN) 12 % lotion Apply topically as directed.        Marland Kitchen  atorvastatin (LIPITOR) 40 MG tablet Take 1 tablet (40 mg total) by mouth daily.  30 tablet  6  . b complex vitamins tablet Take 1 tablet by mouth daily.      Marland Kitchen buPROPion (WELLBUTRIN SR) 150 MG 12 hr tablet TAKE 1 TABLET BY MOUTH TWICE A DAY  60 tablet  5  . carvedilol (COREG) 12.5 MG tablet Take 1 tablet (12.5 mg total) by mouth 2 (two) times daily with a meal.  60 tablet  6  . cetirizine (ZYRTEC) 10 MG tablet Take 1 tablet (10 mg total) by mouth daily.  30 tablet  11  . chlorpheniramine-HYDROcodone (TUSSIONEX) 10-8 MG/5ML LQCR Take 5 mLs by mouth. oNLY WHEN SICK      . Cholecalciferol (VITAMIN D3) 5000 UNITS CAPS Take one tablet by mouth daily      . clopidogrel (PLAVIX) 75 MG tablet Take 1 tablet (75 mg total) by mouth daily.  30 tablet  6  . clotrimazole-betamethasone (LOTRISONE) cream Apply as directed  45 g  prn  . Coenzyme Q10 (COQ10) 100 MG CAPS Take as directed  30 each  11  . cyanocobalamin 2000 MCG tablet Take 1 tablet (2,000 mcg total) by mouth daily.  30 tablet  11  . fish oil-omega-3 fatty acids 1000 MG capsule Take as directed  30 capsule  11  . furosemide (LASIX) 40 MG tablet Take 1 tablet (40 mg total) by mouth daily.  30 tablet  6  . KRILL OIL 1000 MG CAPS Take 1  capsule by mouth daily.      . Misc Natural Products (OSTEO BI-FLEX ADV TRIPLE ST) TABS Take as directed  60 tablet  11  . Multiple Vitamin (MULTIVITAMIN) tablet Take 1 tablet by mouth daily.  30 tablet  11  . niacin (NIASPAN) 500 MG CR tablet Take 1 tablet (500 mg total) by mouth 4 (four) times daily.  120 tablet  6  . nitroGLYCERIN (NITROSTAT) 0.4 MG SL tablet Place 1 tablet (0.4 mg total) under the tongue every 5 (five) minutes as needed. For up to 3 doses.  25 tablet  6  . potassium chloride (KLOR-CON) 20 MEQ packet Take 20 mEq by mouth daily.  30 packet  6  . sildenafil (VIAGRA) 100 MG tablet Take 1 tablet (100 mg total) by mouth as needed for erectile dysfunction.  15 tablet  3  . Simethicone (GAS-X EXTRA STRENGTH)  125 MG CAPS Prn      . tadalafil (CIALIS) 20 MG tablet Take as directed  3 tablet  5  . TESTIM 50 MG/5GM GEL APPLY CONTENTS OF TWO 5 GM TUBES ONTO SKIN OF ARMS/SHOULDERS DAILY  300 g  5  . TRANSDERM-SCOP 1.5 MG APPLY 1 PATCH BEHIND EAR EVERY 3 DAYS  8 each  5   No facility-administered medications prior to visit.   Allergies  Allergen Reactions  . Aspirin     REACTION: allergic to ASA w/ hives...  . Codeine   . Irbesartan     REACTION: allergic to ARBs w/ angioedema  . Ramipril     REACTION: Allergic to ACE's w/ angioedema...   Patient Active Problem List  Diagnosis  . COLONIC POLYPS  . HYPERLIPIDEMIA  . ANXIETY  . OBSTRUCTIVE SLEEP APNEA  . HYPERTENSION  . ARTERIOSCLEROTIC HEART DISEASE  . BRONCHITIS, RECURRENT  . PULMONARY NODULE  . GERD  . DIVERTICULOSIS OF COLON  . DEGENERATIVE JOINT DISEASE  . LOW BACK PAIN SYNDROME  . DIABETES MELLITUS, BORDERLINE  . TESTICULAR HYPOFUNCTION  . CAD (coronary artery disease)  . Memory disturbance  . Venous insufficiency  . Edema   History  Substance Use Topics  . Smoking status: Former Smoker -- 1.00 packs/day for 20 years    Types: Cigarettes    Quit date: 05/08/1998  . Smokeless tobacco: Not on file     Comment: quit in 2005  . Alcohol Use: 0.5 oz/week    1 drink(s) per week     Comment: social drinker   family history includes Colon cancer in his father; Diabetes in his sister; Hypertension in his sister; and Stroke in his mother.     Objective:   Physical Exam       WDWM in Nad. HEENT; /NT EOMI ,PEERLA,anicteric  Neck;supple no JVD, CV: RRR with s1s2,no MRG, Pulm; Clear bilat, Abd; soft, nontender, no mass or hsm, bs+ rectal- not done, Ext; no CCE,skin warm and dry. Neuro; Alert,oriented, mood and affect appropriate.            Assessment & Plan:  #53 62 year old white male with history of adenomatous  colon polyps-due for followup colonoscopy #2 family history of colon cancer in patient's father #3 chronic  antiplatelet therapy with Plavix #4 coronary artery disease status post MI #5 adult onset diabetes mellitus #6 diverticulosis #7 sleep apnea #8 hypertension  Plan; patient is scheduled for colonoscopy with Dr. Arlyce Dice. Procedure discussed in detail with the patient and he is agreeable to proceed. Patient does not want  to come off Plavix-given his  history of critical illness with previous MI. We discussed risks and benefits of holding antiplatelet therapy prior to endoscopic procedures . I explained that colonoscopy can be done with him on Plavix however if any larger polyps are seen he may require a second procedure off Plavix to have these removed. He is agreeable, and understands increased risk of bleeding with polypectomy if done on Plavix  PT   WILL REMAIN ON PLAVIX  FOR COLONOSCOPY.

## 2012-06-26 NOTE — Patient Instructions (Addendum)
We sent a prescription for the moviprep you will be drinking for the colonoscopy to [facility].  We have given you a $20 rebate coupon to use. You have been scheduled for a colonoscopy with propofol. Please follow written instructions given to you at your visit today.  Please pick up your prep kit at the pharmacy within the next 1-3 days. If you use inhalers (even only as needed), please bring them with you on the day of your procedure.

## 2012-06-27 NOTE — Progress Notes (Signed)
Reviewed and agree with management. Alaylah Heatherington D. Zoeie Ritter, M.D., FACG  

## 2012-06-29 ENCOUNTER — Encounter: Payer: 59 | Admitting: Gastroenterology

## 2012-06-30 ENCOUNTER — Telehealth: Payer: Self-pay

## 2012-06-30 NOTE — Telephone Encounter (Signed)
Spoke with pt about his ongoing issues with inaccuracies in My Chart and told him I would try to make some calls to figure it out.  I called several numbers to try to help him and finally spoke to [facility] with Dr. Jodelle Green office.  She said she had tried to help him with this in the past but agreed to give him a call to see if it could be resolved further.

## 2012-07-09 ENCOUNTER — Other Ambulatory Visit: Payer: Self-pay | Admitting: Pulmonary Disease

## 2012-07-10 ENCOUNTER — Encounter: Payer: Self-pay | Admitting: Gastroenterology

## 2012-07-13 ENCOUNTER — Encounter: Payer: 59 | Admitting: Cardiovascular Disease

## 2012-07-18 ENCOUNTER — Other Ambulatory Visit: Payer: Self-pay | Admitting: Pulmonary Disease

## 2012-07-20 ENCOUNTER — Encounter: Payer: 59 | Admitting: Gastroenterology

## 2012-07-21 ENCOUNTER — Telehealth: Payer: Self-pay | Admitting: Pulmonary Disease

## 2012-07-21 MED ORDER — TESTOSTERONE 50 MG/5GM (1%) TD GEL: TRANSDERMAL | Status: DC

## 2012-07-21 NOTE — Telephone Encounter (Signed)
Rx has been called in  

## 2012-08-08 ENCOUNTER — Encounter: Payer: 59 | Admitting: Cardiovascular Disease

## 2012-08-29 ENCOUNTER — Ambulatory Visit (AMBULATORY_SURGERY_CENTER): Payer: 59 | Admitting: Gastroenterology

## 2012-08-29 ENCOUNTER — Encounter: Payer: Self-pay | Admitting: Gastroenterology

## 2012-08-29 VITALS — BP 138/77 | HR 60 | Temp 97.0°F | Resp 18 | Ht 69.0 in | Wt 219.0 lb

## 2012-08-29 DIAGNOSIS — Z800 Family history of malignant neoplasm of digestive organs: Secondary | ICD-10-CM

## 2012-08-29 DIAGNOSIS — Z1211 Encounter for screening for malignant neoplasm of colon: Secondary | ICD-10-CM

## 2012-08-29 DIAGNOSIS — K5730 Diverticulosis of large intestine without perforation or abscess without bleeding: Secondary | ICD-10-CM

## 2012-08-29 DIAGNOSIS — D126 Benign neoplasm of colon, unspecified: Secondary | ICD-10-CM

## 2012-08-29 DIAGNOSIS — Z86010 Personal history of colonic polyps: Secondary | ICD-10-CM

## 2012-08-29 MED ORDER — SODIUM CHLORIDE 0.9 % IV SOLN: 500.0000 mL | INTRAVENOUS | Status: DC

## 2012-08-29 NOTE — Progress Notes (Signed)
Called to room to assist during endoscopic procedure.  Patient ID and intended procedure confirmed with present staff. Received instructions for my participation in the procedure from the performing physician.  

## 2012-08-29 NOTE — Op Note (Signed)
[facility] [address] [place], [address]   COLONOSCOPY PROCEDURE REPORT  PATIENT: Larry Stone, Larry Stone  MR#: [id number] BIRTHDATE: 04/28/1950 , 62  yrs. old GENDER: Male ENDOSCOPIST: Louis Meckel, MD REFERRED BY: PROCEDURE DATE:  08/29/2012 PROCEDURE:   Colonoscopy with snare polypectomy ASA CLASS:   Class II INDICATIONS:Patient's immediate family history of colon cancer. MEDICATIONS: MAC sedation, administered by CRNA and propofol (Diprivan) 200mg  IV  DESCRIPTION OF PROCEDURE:   After the risks benefits and alternatives of the procedure were thoroughly explained, informed consent was obtained.  A digital rectal exam revealed no abnormalities of the rectum.   The LB [id number]  endoscope was introduced through the anus and advanced to the cecum, which was identified by both the appendix and ileocecal valve. No adverse events experienced.   The quality of the prep was excellent using Suprep  The instrument was then slowly withdrawn as the colon was fully examined.      COLON FINDINGS: A sessile polyp measuring 4 mm in size was found in the descending colon.  A polypectomy was performed with a cold snare.  The resection was complete and the polyp tissue was completely retrieved.   Mild diverticulosis was noted in the sigmoid colon.   The colon mucosa was otherwise normal. Retroflexed views revealed no abnormalities. The time to cecum=2 minutes 15 seconds.  Withdrawal time=9 minutes 30 seconds.  The scope was withdrawn and the procedure completed. COMPLICATIONS: There were no complications.  ENDOSCOPIC IMPRESSION: 1.   Sessile polyp measuring 4 mm in size was found in the descending colon; polypectomy was performed with a cold snare 2.   Mild diverticulosis was noted in the sigmoid colon 3.   The colon mucosa was otherwise normal  RECOMMENDATIONS: Given your significant family history of colon cancer, you should have a repeat colonoscopy in 5  years   eSigned:  Louis Meckel, MD 08/29/2012 10:32 AM   cc: Michele Mcalpine, MD and Zelphia Cairo MD   PATIENT NAME:  Larry Stone, Larry Stone MR#: [id number]

## 2012-08-29 NOTE — Progress Notes (Signed)
Patient did not experience any of the following events: a burn prior to discharge; a fall within the facility; wrong site/side/patient/procedure/implant event; or a hospital transfer or hospital admission upon discharge from the facility. (G8907) Patient did not have preoperative order for IV antibiotic SSI prophylaxis. (G8918)  

## 2012-08-29 NOTE — Progress Notes (Signed)
Lidocaine-40mg IV prior to Propofol InductionPropofol given over incremental dosages 

## 2012-08-29 NOTE — Patient Instructions (Signed)
YOU HAD AN ENDOSCOPIC PROCEDURE TODAY AT THE [facility]: Refer to the procedure report that was given to you for any specific questions about what was found during the examination.  If the procedure report does not answer your questions, please call your gastroenterologist to clarify.  If you requested that your care partner not be given the details of your procedure findings, then the procedure report has been included in a sealed envelope for you to review at your convenience later.  YOU SHOULD EXPECT: Some feelings of bloating in the abdomen. Passage of more gas than usual.  Walking can help get rid of the air that was put into your GI tract during the procedure and reduce the bloating. If you had a lower endoscopy (such as a colonoscopy or flexible sigmoidoscopy) you may notice spotting of blood in your stool or on the toilet paper. If you underwent a bowel prep for your procedure, then you may not have a normal bowel movement for a few days.  DIET: Your first meal following the procedure should be a light meal and then it is ok to progress to your normal diet.  A half-sandwich or bowl of soup is an example of a good first meal.  Heavy or fried foods are harder to digest and may make you feel nauseous or bloated.  Likewise meals heavy in dairy and vegetables can cause extra gas to form and this can also increase the bloating.  Drink plenty of fluids but you should avoid alcoholic beverages for 24 hours.  ACTIVITY: Your care partner should take you home directly after the procedure.  You should plan to take it easy, moving slowly for the rest of the day.  You can resume normal activity the day after the procedure however you should NOT DRIVE or use heavy machinery for 24 hours (because of the sedation medicines used during the test).    SYMPTOMS TO REPORT IMMEDIATELY: A gastroenterologist can be reached at any hour.  During normal business hours, 8:30 AM to 5:00 PM Monday through Friday,  call [phone].  After hours and on weekends, please call the GI answering service at [phone] who will take a message and have the physician on call contact you.   Following lower endoscopy (colonoscopy or flexible sigmoidoscopy):  Excessive amounts of blood in the stool  Significant tenderness or worsening of abdominal pains  Swelling of the abdomen that is new, acute  Fever of 100F or higher    FOLLOW UP: If any biopsies were taken you will be contacted by phone or by letter within the next 1-3 weeks.  Call your gastroenterologist if you have not heard about the biopsies in 3 weeks.  Our staff will call the home number listed on your records the next business day following your procedure to check on you and address any questions or concerns that you may have at that time regarding the information given to you following your procedure. This is a courtesy call and so if there is no answer at the home number and we have not heard from you through the emergency physician on call, we will assume that you have returned to your regular daily activities without incident.  SIGNATURES/CONFIDENTIALITY: You and/or your care partner have signed paperwork which will be entered into your electronic medical record.  These signatures attest to the fact that that the information above on your After Visit Summary has been reviewed and is understood.  Full responsibility of the confidentiality   of this discharge information lies with you and/or your care-partner.     

## 2012-08-30 ENCOUNTER — Telehealth: Payer: Self-pay | Admitting: *Deleted

## 2012-08-30 NOTE — Telephone Encounter (Signed)
  Follow up Call-  Call back number 08/29/2012  Post procedure Call Back phone  # [phone]  Permission to leave phone message Yes     Patient questions:  Do you have a fever, pain , or abdominal swelling? no Pain Score  0 *  Have you tolerated food without any problems? yes  Have you been able to return to your normal activities? yes  Do you have any questions about your discharge instructions: Diet   no Medications  no Follow up visit  no  Do you have questions or concerns about your Care? no  Actions: * If pain score is 4 or above: No action needed, pain <4.

## 2012-09-12 ENCOUNTER — Encounter: Payer: Self-pay | Admitting: Gastroenterology

## 2012-10-28 ENCOUNTER — Other Ambulatory Visit: Payer: Self-pay | Admitting: Pulmonary Disease

## 2012-10-28 ENCOUNTER — Other Ambulatory Visit: Payer: Self-pay | Admitting: Cardiovascular Disease

## 2012-11-14 ENCOUNTER — Ambulatory Visit (INDEPENDENT_AMBULATORY_CARE_PROVIDER_SITE_OTHER): Payer: 59 | Admitting: Pulmonary Disease

## 2012-11-14 ENCOUNTER — Other Ambulatory Visit (INDEPENDENT_AMBULATORY_CARE_PROVIDER_SITE_OTHER): Payer: 59

## 2012-11-14 ENCOUNTER — Encounter: Payer: Self-pay | Admitting: Pulmonary Disease

## 2012-11-14 VITALS — BP 116/74 | HR 67 | Temp 97.9°F | Ht 70.0 in | Wt 222.8 lb

## 2012-11-14 DIAGNOSIS — E785 Hyperlipidemia, unspecified: Secondary | ICD-10-CM

## 2012-11-14 DIAGNOSIS — M545 Low back pain, unspecified: Secondary | ICD-10-CM

## 2012-11-14 DIAGNOSIS — K5730 Diverticulosis of large intestine without perforation or abscess without bleeding: Secondary | ICD-10-CM

## 2012-11-14 DIAGNOSIS — E291 Testicular hypofunction: Secondary | ICD-10-CM

## 2012-11-14 DIAGNOSIS — R7309 Other abnormal glucose: Secondary | ICD-10-CM

## 2012-11-14 DIAGNOSIS — K219 Gastro-esophageal reflux disease without esophagitis: Secondary | ICD-10-CM

## 2012-11-14 DIAGNOSIS — F411 Generalized anxiety disorder: Secondary | ICD-10-CM

## 2012-11-14 DIAGNOSIS — I2510 Atherosclerotic heart disease of native coronary artery without angina pectoris: Secondary | ICD-10-CM

## 2012-11-14 DIAGNOSIS — Z23 Encounter for immunization: Secondary | ICD-10-CM

## 2012-11-14 DIAGNOSIS — I10 Essential (primary) hypertension: Secondary | ICD-10-CM

## 2012-11-14 DIAGNOSIS — M1990 Unspecified osteoarthritis, unspecified site: Secondary | ICD-10-CM

## 2012-11-14 LAB — BASIC METABOLIC PANEL
BUN: 16 mg/dL (ref 6–23)
CO2: 29 mEq/L (ref 19–32)
Calcium: 8.9 mg/dL (ref 8.4–10.5)
Glucose, Bld: 107 mg/dL — ABNORMAL HIGH (ref 70–99)
Sodium: 137 mEq/L (ref 135–145)

## 2012-11-14 NOTE — Progress Notes (Signed)
Subjective:    Patient ID: Larry Stone, male    DOB: 1950/09/06, 62 y.o.   MRN: [id number]  HPI 62 y/o WM here for a followup visit... he has multiple medical problems as noted below...  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  ~  May 13, 2011:  49mo ROV & here for CPX> he reports a good interval & his only new concern is his Memory (see below)- I offered Neuro eval & work up but he declines at this time; he had the 2012 flu vaccine 10/12; he wants Rx for Shingles vaccine as well; he requests Rx for all OTC meds for his Flex spending acct...  We discussed f/u CXR & Fasting blood work today> see prob list below>> CXR 1/13 showed normal heart size, lungs clear w/ old healed right rib fx, NAD... LABS 1/13:  FLP- at goals on Lip40;  Chems- wnl & A1c=6.1;  CBC- wnl;  TSH=1.82;  PSA=0.48;  Testos=363;  UA= clear...  ~  November 03, 2011:  55mo ROV & [name] continues on an impressive med list including lots of supplements that he wants prescriptions written for his flex spending acct... He's been traveling to [place], [place], [place] & doing satis> denies CP, palpit, dizzy, SOB, edema, etc... He is concerned however about LE edema, took his Lasix daily but didn't notice much change "it's really got me concerned"> we discussed VI & the need for low sodium, elevation, support hose, Lasix rx, & we will check Ven Dopplers for completeness...  He is also concerned about the cost of Viagra & wants to try Cialis.    We reviewed prob list, meds, xrays and labs> see below>> Venous Dopplers of LE's 7/13 showed neg for DVT... I told him next step was to refer to a vein clinic...  ~  May 15, 2012:  55mo ROV & [name] has had a good 55mo he says- no new complaints or concerns; he has an impressive list of meds...     OSA> uses Transderm scope prn dizziness, uses "breathe right nasal strips" periodically; resting OK, no  daytime hypersomnolence...    HxBronchitis> on Zyrtek, Tussionex; doing satis w/o cough, phlegm, etc...    HBP> on Coreg 12.5Bid, Amlod5, Lasix40 prn, K20/d; BP= 120/80 & he denies interval CP, palpit, SOB, edema...    CAD, RBBB> on Plavix75; he saw DrMcAlhany 11/13> CAD w/ AMI 2004 & residual 50% Lmain; stable, exercising regularly, no angina & no changes made...    Hyperlipid> on Lip40, Niasp500, FishOil, KrillOil, CoQ10; FLP shows TChol 114, TG 31, HDL 45, LDL 63    DM> on diet alone; wt= 214# & FBS= 112...    GI- GERD, Divetrtics, Polyps> on Simethacone for gas; father died of colon cancer age 31; last colonoscopy 10/07 by Arlyce Dice & showed divertics only- last polyp removed 2004- he is overdue.    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    DJD, LBP> on Osteobiflex, MVI, VitD5000; stable & exercising regularly...    Memory Loss, Anxiety> on Wellbutrin & Alpraz...    ?Hx Vit B12 defic> on B12 po daily (dosed on his own); B12 level = 1005 & ok to decr B12 supplement to 1018mcg/d...    Anxiety> on WellbutrinSR 150Bid, Xanax 0,5mg  prn;  We reviewed prob list,  meds, xrays and labs> see below for updates >> he had the 2013 Flu vaccine 11/13 CXR 1/14 showed normal heart size, clear lungs, chr right rib deformity (old fx), NAD... LABS 1/14:  FLP- at goals on Lip40+Niasp;  Chems- wnl;  CBC- wnl;  TSH=1.93;  PSA=0.39;  Testos=307 (350-890) on Testim 2tubes/d;  B12=1005 (211-911) on B12 2037mcg/d...  ~  November 14, 2012:  22mo ROV & [name] has had a good interval w/o new complaints or concerns;  He has gained 9# up to 223# today & we reviewed diet, exercise , & wt reduction strategies...    He remains on Coreg & Amlodipine, uses Lasix just as needed & rarely needs it); BP= 116/74 & he remains asymptomatic...    Followed by [facility] for Cards on Plavix> seen yearly in the fall & stable  w/o CP, palpit, etc...    He remaiins on Lip40 along w/ CoQ10, krill oil, Fish oil> FLP 1/14 was great, continue same rx...    He had f/u colonoscopy 5/14 by DrKaplan (done w/ pt on Plavix rx)> mild divertics & 4mm polyp in desc colon removed (tubular adenoma) & repeat rec in 5 yrs...     He continues on Testim using 1 tube daily; +Viagra/Cialis for ED; he notes energy good, feeling well, no issues reported... We reviewed prob list, meds, xrays and labs> see below for updates >> OK TDAP today...            Problem List:     ALLERGIC to ASA w/ hives, & ACE inhibitors/ ARBs too w/ angioedema.  OBSTRUCTIVE SLEEP APNEA (ICD-327.23) - s/p UPPP surgery in 2004 by DrRedman...  states he's doing OK, denies snoring, no daytime hypersom, wife not complaining... uses ZYRTEK for allergy symtoms. ~  1/13: He has started using the "breathe right nasal strips" qhs for recurrent snoring; still says he rests well, denies daytime hypersomnolence, & declines offer for repeat sleep study to check... ~  7/14:  He reports resting well, no daytime symptoms or sleep issues...   Hx of BRONCHITIS, RECURRENT (ICD-491.9) - he is an ex-smoker, prev 1 ppd for 36yrs, quit in 2004. ~  Hx tiny RML nodule seen on Cardiac CT 2/11 & apparently unchanged from old CTChest 2004... ~  10/11: f/u CT Chest for f/u tiny 3mm nodule RML- unchanged xyrs & benign, mild centilob emphysema, +coronary calcif, NAD... ~  1/13: he denies breathing problems, intercurrent infections, etc... ~  1/14:  CXR 1/14 showed normal heart size, clear lungs, chr right rib deformity (old fx), NAD.   HYPERTENSION (ICD-401.9) - on COREG 12.5mg Bid, AMLODIPINE 5mg /d, & LASIX 40mg - only as needed (& he hasn't needed any), + KCl daily ("DrBensimhon said to take it every day")... incr stress w/ mother's stroke and he's the care giver...  ~  8/12: BP= 118/84, taking meds regularly & tol well> denies HA, visual changes, CP, palipit, dizziness, syncope, dyspnea,  edema, etc... ~  1/13: BP= 120/82 & he remains asymptomatic... ~  1/14: on Coreg 12.5Bid, Amlod5, Lasix40 prn, K20/d; BP= 120/80 & he denies interval CP, palpit, SOB, edema.  ARTERIOSCLEROTIC HEART DISEASE (ICD-414.00) - on PLAVIX 75mg /d, allergic to ASA... followed by DrBensimhon and seen regularly...  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 ~  f/u Cardiac CT 2/11 showed>  IMPRESSION: Calcified plaque in the left main with < 50% stenosis.  Calcified plaque in the proximal and mid LAD with at most moderate (around 50%)  stenosis in the mid LAD.  The CFX is dominant, supplying the left PDA.  The AV CFX appears patent with mild stenosis at most. There are 2 moderate, closely spaced obtuse marginal vessels that have calcified plaque but are patent.  They are not seen well enough to comment on degree of stenosis within the vessels.  The RCA is small and nondominant. ~  Vasc Screen per Cards 10/11 showed mild carotid plaques, norm AbdAo & ABIs... ~  1/13:  He denies CP, palpit, SOB, etc; he exercises by walking regularly & using the bowflex. ~  7/13: Venous Dopplers are neg- no evid DVT etc... ~  11/13: he had Cards f/u DrMcAlhany> CAD s/p AMI (inferolat) 2004, residual 50% Lmain, HBP; doing well- walking, bowflex, etc; no angina; no changes made;  EKG 11/13 showed NSR, rate60, RBBB, inferior & lat scars, NAD...  HYPERLIPIDEMIA (ICD-272.4) - on LIPITOR 40mg /d, NIASPAN 500Qid, FISH OIL daily... ~  FLP3/09 showed TChol 117, TG 46, HDL 38, LDL 70 ~  FLP 7/10 > pt never ret for fasting labs after this visit. ~  FLP 2/11 showed TChol 123, TG 56, HDL 46, LDL 66 ~  FLP 11/11 showed TChol 114, TG 52, HDL 39, LDL 64... Continue Lip40, Niasp4/d, & FishOil. ~  FLP 1/13 showed TChol 131, TG 50, HDL 48, LDL 73 ~  1/14: on Lip40, [id number], FishOil, KrillOil, CoQ10; FLP shows TChol 114, TG 31, HDL 45, LDL 63  DIABETES MELLITUS, BORDERLINE (ICD-790.29) - on diet  alone... ~  labs 3/09 (wt=225#) showed BS= 114, HgA1c= 6.2 ~  labs 7/10 (wt=229#) > pt never ret for fasting labs after this ov. ~  labs 2/11 (wt=206#) showed BS= 93, A1c= 5.9 ~  labs 2/12 (wt=214#) showed BS= 84, A1c= 6.1.Marland KitchenMarland Kitchen Needs to lose wt. ~  Labs 1/13 (wt=219#) showed BS= 109, A1c= 6.1 ~  1/14: on diet alone; wt= 214# & FBS= 112.  GERD (ICD-530.81) - he uses H2 blockers as needed.  DIVERTICULOSIS OF COLON (ICD-562.10) & COLONIC POLYPS (ICD-211.3) - his last polyps were ~54mm and removed in 2004= adenomatous... last colonoscopy 10/07 by Dorris Singh done while on his Plavix therapy showed divertics only... f/u planned 30yrs... there is a +fam hx of colon cancer in his father who died at age 53... (he tells me he would prefer to have colonoscopies every 47yrs). ~  He had f/u colonoscopy 5/14 by DrKaplan (done w/ pt on Plavix rx)> mild divertics & 4mm polyp in desc colon removed (tubular adenoma) & repeat rec in 5 yrs.   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 ~  Labs 2/12 showed Testos level = 364;  PSA= 0.43... ~  3/12:  Add-on appt at pt request for mult questions> he wants me to know that he feels terrible & that his "quality of life is going down over the last yr";  he notes that he exercises at the gym regularly & the trainer can't understand why he isn't gaining muscle, and he can't get rid of his gut despite watching his diet, & he notes sexual dysfunction, etc; he has checked everything on the internet & he wants to try TESTIM Gel for 26mo to see if this makes a diff for him; recent labs w/ Testos level = 364 (350-890) & so we will go along w/ his desire for this trial> offered consideration of other meds, and offered Urology referral for second opinion & poss Testos shots but he declines &  wants to try what he has researched>  we wrote Rx for TESTIM Gel 5gm tubes- start w/ one tube rubbed into skin of arms/ shoulders dailt for several weeks & incr to 2  tubes thereafter (he says his insurance will pay for up to 3 tubes per day)... he is rec to ret for Testos level in ~58months (he will call when ready- he never did). ~  8/12:  He states he is feeling much better, still on the TESTIM 2 of the 5gm tubes rubbed into skin daily, but he oddly does not credit this intervention w/ his new found well-being etc;  He never returned for f/u Testosterone level on the gel & currently feels well enough that he doesn't care what the level is> he's going to continue current med RX no matter what... ~  Labs 1/13 ?on Testim 2 tubes daily? showed Testosterone level = 363 (no change);  PSA= 0.48... ~  1/14: on Testim 2tubes daily, +Viagra&Cialis as needed; labs showed PSA=0.39 and Testos level = 307 with him feeling well, good energy/ libido/ etc. ~  7/14: he reports using Testim 1 tube daily 7 energy is good, feeling well, wants to continue this dose...  DEGENERATIVE JOINT DISEASE (ICD-715.90) - he reports doing fair- mostly c/o knees & hands ("I messed up my knee in a fall")... he saw Ortho ?who? on Glucosamine/ Chondroitin supplements.  LOW BACK PAIN SYNDROME (ICD-724.2)  MEMORY LOSS >>  ~  1/13: he states that his "memory is fuzzy" & "Clarity is missing"; occas he will swear that he said something but he really didn't; he's had prev scans etc & I have recommended a Neurology eval for his memory but he declines at this time "I'll think about it" he said...  ANXIETY (ICD-300.00) - on WELLBUTRIN 150mg Bid, & ALPRAZOLAM 0.5mg  Prn... increased stress w/ mother's stroke last year... he's the main care giver w/ 2 sisters who don't help much...   Health Maintenance: ~  GI:  due for f/u colonoscopy 10/12... ~  GU:  DRE is neg, PSA=0.43, Testos= 364 (350-890), on Viagra Prn... ~  Immuniztions:  he is encouraged to get the yearly Flu vaccine... given PNEUMOVAX 2/12... He reports a lot of travel & he's been to the travel clinic & had "every vaccine known to man"...   Past  Surgical History  Procedure Laterality Date  . Uvulopalatopharyngoplasty      surgery for OSA  . Cardiac catheterization      Outpatient Encounter Prescriptions as of 11/14/2012  Medication Sig Dispense Refill  . ALPRAZolam (XANAX) 0.5 MG tablet TAKE 1 TABLET THREE TIMES A DAY AS NEEDED  90 tablet  5  . amLODipine (NORVASC) 5 MG tablet TAKE 1 TABLET BY MOUTH DAILY  30 tablet  5  . ammonium lactate (LAC-HYDRIN) 12 % lotion Apply topically as directed.        Marland Kitchen atorvastatin (LIPITOR) 40 MG tablet TAKE 1 TABLET BY MOUTH DAILY.  30 tablet  5  . b complex vitamins tablet Take 1 tablet by mouth daily.      Marland Kitchen buPROPion (WELLBUTRIN SR) 150 MG 12 hr tablet TAKE 1 TABLET BY MOUTH TWICE A DAY  60 tablet  5  . carvedilol (COREG) 12.5 MG tablet TAKE 1 TABLET BY MOUTH TWICE A DAY WITH A MEAL  60 tablet  5  . cetirizine (ZYRTEC) 10 MG tablet Take 1 tablet (10 mg total) by mouth daily.  30 tablet  11  . Cholecalciferol (VITAMIN D3) 5000 UNITS  CAPS Take one tablet by mouth daily      . CIALIS 20 MG tablet TAKE AS DIRECTED  3 tablet  5  . clopidogrel (PLAVIX) 75 MG tablet TAKE 1 TABLET BY MOUTH DAILY  30 tablet  5  . clotrimazole-betamethasone (LOTRISONE) cream Apply as directed  45 g  prn  . Coenzyme Q10 (COQ10) 100 MG CAPS Take as directed  30 each  11  . cyanocobalamin 2000 MCG tablet Take 1 tablet (2,000 mcg total) by mouth daily.  30 tablet  11  . fish oil-omega-3 fatty acids 1000 MG capsule Take as directed  30 capsule  11  . furosemide (LASIX) 40 MG tablet Take 1 tablet (40 mg total) by mouth daily.  30 tablet  6  . KRILL OIL 1000 MG CAPS Take 1 capsule by mouth daily.      . Misc Natural Products (OSTEO BI-FLEX ADV TRIPLE ST) TABS Take as directed  60 tablet  11  . Multiple Vitamin (MULTIVITAMIN) tablet Take 1 tablet by mouth daily.  30 tablet  11  . nitroGLYCERIN (NITROSTAT) 0.4 MG SL tablet Place 1 tablet (0.4 mg total) under the tongue every 5 (five) minutes as needed. For up to 3 doses.  25  tablet  6  . sildenafil (VIAGRA) 100 MG tablet Take 1 tablet (100 mg total) by mouth as needed for erectile dysfunction.  15 tablet  3  . Simethicone (GAS-X EXTRA STRENGTH) 125 MG CAPS Prn      . testosterone (TESTIM) 50 MG/5GM GEL Apply contents of 2 5gm tubes onto the skin of arms/shoulders daily  300 g  5  . TRANSDERM-SCOP 1.5 MG APPLY 1 PATCH BEHIND EAR EVERY 3 DAYS  8 each  5  . [DISCONTINUED] niacin (NIASPAN) 500 MG CR tablet Take 1 tablet (500 mg total) by mouth 4 (four) times daily.  120 tablet  6  . [DISCONTINUED] potassium chloride (KLOR-CON) 20 MEQ packet Take 20 mEq by mouth daily.  30 packet  6   No facility-administered encounter medications on file as of 11/14/2012.    Allergies  Allergen Reactions  . Aspirin     REACTION: allergic to ASA w/ hives...  . Codeine   . Irbesartan     REACTION: allergic to ARBs w/ angioedema  . Ramipril     REACTION: Allergic to ACE's w/ angioedema...    Current Medications, Allergies, Past Medical History, Past Surgical History, Family History, and Social History were reviewed in [employer] record.    Review of Systems       See HPI - all other systems neg except as noted...       The patient complains of dyspnea on exertion and muscle weakness.  The patient denies anorexia, fever, weight loss, weight gain, vision loss, decreased hearing, hoarseness, chest pain, syncope, peripheral edema, prolonged cough, headaches, hemoptysis, abdominal pain, melena, hematochezia, severe indigestion/heartburn, hematuria, incontinence, suspicious skin lesions, transient blindness, difficulty walking, depression, unusual weight change, abnormal bleeding, enlarged lymph nodes, and angioedema.     Objective:   Physical Exam    WD, sl overweight, 62 y/o WM in NAD... GENERAL:  Alert & oriented; pleasant & cooperative... HEENT:  [place]/AT, EOM-wnl, PERRLA, EACs-clear, TMs-wnl, NOSE-clear, THROAT-s/p UPPP surg... NECK:  Supple w/ fairROM;  no JVD; normal carotid impulses w/o bruits; no thyromegaly or nodules palpated; no lymphadenopathy. CHEST:  Clear to P & A; without wheezes/ rales/ or rhonchi. HEART:  Regular Rhythm; without murmurs/ rubs/ or gallops. ABDOMEN:  Soft & nontender; normal bowel sounds; no organomegaly or masses detected. EXT: without deformities, mild arthritic changes; no varicose veins/ venous insuffic/ or edema. Neuro:  intact w/o focal abn detected... DERM:  No lesions noted; no rash etc...  RADIOLOGY DATA:  Reviewed in the EPIC EMR & discussed w/ the patient...   LABORATORY DATA:  Reviewed in the EPIC EMR & discussed w/ the patient...     Assessment & Plan:    Hx OSA, s/p UPPP in 2004>  noted recurrent snoring & using breathe right nasal strips; he doesn't want repeat sleep study or further eval, just want Rx for the strips so he can pay for them w/ his flex acct...  Hx recurrent bronchitis>  He quit smoking in 2004 & no recent bronchitic exacerbations...  HBP>  Controlled on BBlocker, CCB, Diuretic; continue same...  CAD>  Followed by Lawson Fiscal on above + Plavix, he is allergic to ASA...  Ven Insuffic, Edema>  We reviewed the pathophysiology & the need for no salt, elevation, support hse, Lasix40;  ven dopplers (7/13) were neg/ normal/ no DVT...  HYPERLIPIDEMIA>  On Liptor & CoQ10 w/ good numbers at goal...  Borderline DM>  On diet alone & doing well...  GI> GERD, Divertics, Polyps>  Stable, f/u colon 5/14 w/ adenoma removed & next f/u due in 10yrs...  DJD/ LBP>  He treats self w/ OTC meds...  ANXIETY>  On Wellbutrin & Alprazolam, stable & he wants to continue the same...   Patient's Medications  New Prescriptions   No medications on file  Previous Medications   ALPRAZOLAM (XANAX) 0.5 MG TABLET    TAKE 1 TABLET THREE TIMES A DAY AS NEEDED   AMLODIPINE (NORVASC) 5 MG TABLET    TAKE 1 TABLET BY MOUTH DAILY   AMMONIUM LACTATE (LAC-HYDRIN) 12 % LOTION    Apply topically as directed.      ATORVASTATIN (LIPITOR) 40 MG TABLET    TAKE 1 TABLET BY MOUTH DAILY.   B COMPLEX VITAMINS TABLET    Take 1 tablet by mouth daily.   BUPROPION (WELLBUTRIN SR) 150 MG 12 HR TABLET    TAKE 1 TABLET BY MOUTH TWICE A DAY   CARVEDILOL (COREG) 12.5 MG TABLET    TAKE 1 TABLET BY MOUTH TWICE A DAY WITH A MEAL   CETIRIZINE (ZYRTEC) 10 MG TABLET    Take 1 tablet (10 mg total) by mouth daily.   CHOLECALCIFEROL (VITAMIN D3) 5000 UNITS CAPS    Take one tablet by mouth daily   CIALIS 20 MG TABLET    TAKE AS DIRECTED   CLOPIDOGREL (PLAVIX) 75 MG TABLET    TAKE 1 TABLET BY MOUTH DAILY   CLOTRIMAZOLE-BETAMETHASONE (LOTRISONE) CREAM    Apply as directed   COENZYME Q10 (COQ10) 100 MG CAPS    Take as directed   CYANOCOBALAMIN 2000 MCG TABLET    Take 1 tablet (2,000 mcg total) by mouth daily.   FISH OIL-OMEGA-3 FATTY ACIDS 1000 MG CAPSULE    Take as directed   FUROSEMIDE (LASIX) 40 MG TABLET    Take 1 tablet (40 mg total) by mouth daily.   KRILL OIL 1000 MG CAPS    Take 1 capsule by mouth daily.   MISC NATURAL PRODUCTS (OSTEO BI-FLEX ADV TRIPLE ST) TABS    Take as directed   MULTIPLE VITAMIN (MULTIVITAMIN) TABLET    Take 1 tablet by mouth daily.   NITROGLYCERIN (NITROSTAT) 0.4 MG SL TABLET    Place 1 tablet (0.4 mg  total) under the tongue every 5 (five) minutes as needed. For up to 3 doses.   SILDENAFIL (VIAGRA) 100 MG TABLET    Take 1 tablet (100 mg total) by mouth as needed for erectile dysfunction.   SIMETHICONE (GAS-X EXTRA STRENGTH) 125 MG CAPS    Prn   TESTOSTERONE (TESTIM) 50 MG/5GM GEL    Apply contents of 2 5gm tubes onto the skin of arms/shoulders daily   TRANSDERM-SCOP 1.5 MG    APPLY 1 PATCH BEHIND EAR EVERY 3 DAYS  Modified Medications   No medications on file  Discontinued Medications   NIACIN (NIASPAN) 500 MG CR TABLET    Take 1 tablet (500 mg total) by mouth 4 (four) times daily.   POTASSIUM CHLORIDE (KLOR-CON) 20 MEQ PACKET    Take 20 mEq by mouth daily.

## 2012-11-14 NOTE — Patient Instructions (Addendum)
Today we updated your med list in our EPIC system...    Continue your current medications the same...  Today we did your targeted diabetic lab follow up...    We will contact you w/ the results when available...   Let's get on track w/ our diet & exercise program...    The goal is to lose 15-20 lbs...  Call for any questions...  Let's plan a follow up visit in 64mo w/ fasting blood work, sooner if needed for problems.Marland KitchenMarland Kitchen

## 2012-12-05 ENCOUNTER — Other Ambulatory Visit: Payer: Self-pay | Admitting: Pulmonary Disease

## 2012-12-15 ENCOUNTER — Telehealth: Payer: Self-pay | Admitting: Pulmonary Disease

## 2012-12-15 MED ORDER — METHYLPREDNISOLONE (PAK) 4 MG PO TABS: ORAL_TABLET | ORAL | Status: DC

## 2012-12-15 NOTE — Telephone Encounter (Signed)
Called and spoke with pt and he stated that he has a slight fever today,  Cough that is non productive.  Sinus drainage is clear.  Having some facial pressure.  All going on for about 2 days.  Pt is leaving for the beach today and would like something called to the pharmacy.  SN please advise. thanks  Allergies  Allergen Reactions  . Aspirin     REACTION: allergic to ASA w/ hives...  . Codeine   . Irbesartan     REACTION: allergic to ARBs w/ angioedema  . Ramipril     REACTION: Allergic to ACE's w/ angioedema.Marland KitchenMarland Kitchen

## 2012-12-15 NOTE — Telephone Encounter (Signed)
Per SN---  Nasal saline spray every 1-2 hours  Medrol dosepak  #1  Take as directed mucinex 600 mg  otc  2 po bid with plenty of fluids  Called and spoke with pt and he is aware of SN recs and that the medrol has been sent to the pharmacy and the pt is aware. Pt is aware of SN recs and nothing further is needed.

## 2013-01-11 ENCOUNTER — Other Ambulatory Visit: Payer: Self-pay | Admitting: Pulmonary Disease

## 2013-02-19 ENCOUNTER — Other Ambulatory Visit: Payer: Self-pay | Admitting: Pulmonary Disease

## 2013-02-20 ENCOUNTER — Ambulatory Visit (INDEPENDENT_AMBULATORY_CARE_PROVIDER_SITE_OTHER): Payer: 59

## 2013-02-20 DIAGNOSIS — Z23 Encounter for immunization: Secondary | ICD-10-CM

## 2013-03-13 ENCOUNTER — Other Ambulatory Visit: Payer: Self-pay | Admitting: Pulmonary Disease

## 2013-03-18 ENCOUNTER — Other Ambulatory Visit: Payer: Self-pay | Admitting: Pulmonary Disease

## 2013-03-20 ENCOUNTER — Telehealth: Payer: Self-pay | Admitting: *Deleted

## 2013-03-20 MED ORDER — SILDENAFIL CITRATE 100 MG PO TABS: 100.0000 mg | ORAL_TABLET | ORAL | Status: DC | PRN

## 2013-03-20 NOTE — Telephone Encounter (Signed)
Message copied by Carmela Hurt on Tue Mar 20, 2013  3:04 PM ------      Message from: Verne Carrow D      Created: Tue Mar 20, 2013 11:56 AM      Regarding: RE: REFILL       That will be fine. Thanks for refilling for him. Thayer Ohm            ----- Message -----         From: Carmela Hurt, RN         Sent: 03/20/2013   8:49 AM           To: Kathleene Hazel, MD, #      Subject: REFILL                                                   Dr Clifton James, can I refill this patients viagra? Thanks, Addison Lank, RN            [facility] [address]      100 mg      15 each      3 refills       ------

## 2013-05-14 ENCOUNTER — Other Ambulatory Visit: Payer: Self-pay | Admitting: Cardiovascular Disease

## 2013-05-21 ENCOUNTER — Ambulatory Visit: Payer: 59 | Admitting: Pulmonary Disease

## 2013-06-18 ENCOUNTER — Other Ambulatory Visit: Payer: Self-pay | Admitting: Cardiovascular Disease

## 2013-06-18 ENCOUNTER — Other Ambulatory Visit: Payer: Self-pay | Admitting: Pulmonary Disease

## 2013-06-21 ENCOUNTER — Other Ambulatory Visit: Payer: Self-pay | Admitting: Pulmonary Disease

## 2013-07-10 ENCOUNTER — Ambulatory Visit (INDEPENDENT_AMBULATORY_CARE_PROVIDER_SITE_OTHER): Payer: 59 | Admitting: Pulmonary Disease

## 2013-07-10 ENCOUNTER — Encounter: Payer: Self-pay | Admitting: Pulmonary Disease

## 2013-07-10 VITALS — BP 124/80 | HR 60 | Temp 96.7°F | Ht 70.0 in | Wt 223.0 lb

## 2013-07-10 DIAGNOSIS — M545 Low back pain, unspecified: Secondary | ICD-10-CM

## 2013-07-10 DIAGNOSIS — I10 Essential (primary) hypertension: Secondary | ICD-10-CM

## 2013-07-10 DIAGNOSIS — E785 Hyperlipidemia, unspecified: Secondary | ICD-10-CM

## 2013-07-10 DIAGNOSIS — K5730 Diverticulosis of large intestine without perforation or abscess without bleeding: Secondary | ICD-10-CM

## 2013-07-10 DIAGNOSIS — E291 Testicular hypofunction: Secondary | ICD-10-CM

## 2013-07-10 DIAGNOSIS — R7309 Other abnormal glucose: Secondary | ICD-10-CM

## 2013-07-10 DIAGNOSIS — K219 Gastro-esophageal reflux disease without esophagitis: Secondary | ICD-10-CM

## 2013-07-10 DIAGNOSIS — D126 Benign neoplasm of colon, unspecified: Secondary | ICD-10-CM

## 2013-07-10 DIAGNOSIS — I2510 Atherosclerotic heart disease of native coronary artery without angina pectoris: Secondary | ICD-10-CM

## 2013-07-10 DIAGNOSIS — M1990 Unspecified osteoarthritis, unspecified site: Secondary | ICD-10-CM

## 2013-07-10 DIAGNOSIS — F411 Generalized anxiety disorder: Secondary | ICD-10-CM

## 2013-07-10 NOTE — Patient Instructions (Signed)
Today we updated your med list in our EPIC system...    Continue your current medications the same...  Please return to our lab one morning this week for your FASTING blood work...    We will contact you w/ the results when available...   Work on weight reduction...  We will arrange for a Cardiology f/u w/ DrMcAlhany at your convenience...  Call for any questions or if we can be of service in any way... You should plan a routine medical foloow up in about 6 months.Marland KitchenMarland Kitchen

## 2013-07-10 NOTE — Progress Notes (Signed)
Subjective:    Patient ID: Larry Stone, male    DOB: [birth year], 63 y.o.   MRN: [id number]  HPI 63 y/o WM here for a followup visit... he has multiple medical problems as noted below...  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  ~  November 03, 2011:  44mo ROV & Larry Stone continues on an impressive med list including lots of supplements that he wants prescriptions written for his flex spending acct... He's been traveling to [place], [place], [place] & doing satis> denies CP, palpit, dizzy, SOB, edema, etc... He is concerned however about LE edema, took his Lasix daily but didn't notice much change "it's really got me concerned"> we discussed VI & the need for low sodium, elevation, support hose, Lasix rx, & we will check Ven Dopplers for completeness...  He is also concerned about the cost of Viagra & wants to try Cialis.    We reviewed prob list, meds, xrays and labs> see below>>  Venous Dopplers of LE's 7/13 showed neg for DVT... I told him next step was to refer to a vein clinic...  ~  May 15, 2012:  21mo ROV & Larry Stone has had a good 67mo he says- no new complaints or concerns; he has an impressive list of meds...     OSA> uses Transderm scope prn dizziness, uses "breathe right nasal strips" periodically; resting OK, no daytime hypersomnolence...    HxBronchitis> on Zyrtek, Tussionex; doing satis w/o cough, phlegm, etc...    HBP> on Coreg 12.5Bid, Amlod5, Lasix40 prn, K20/d; BP= 120/80 & he denies interval CP, palpit, SOB, edema...    CAD, RBBB> on Plavix75; he saw DrMcAlhany 11/13> CAD w/ AMI 2004 & residual 50% Lmain; stable, exercising regularly, no angina & no changes made...    Hyperlipid> on Lip40, Niasp500, FishOil, KrillOil, CoQ10; FLP shows TChol 114, TG 31, HDL 45, LDL 63    DM> on diet alone; wt= 214# & FBS= 112...    GI- GERD, Divetrtics, Polyps> on Simethacone for gas; father died of  colon cancer age 76; last colonoscopy 10/07 by Deatra Ina & showed divertics only- last polyp removed 2004- he is overdue.    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    DJD, LBP> on Osteobiflex, MVI, VitD5000; stable & exercising regularly...    Memory Loss, Anxiety> on Wellbutrin & Alpraz...    ?Hx Vit B12 defic> on B12 2072mcg po daily (dosed on his own); B12 level = 1005 & ok to decr B12 supplement to 1089mcg/d...    Anxiety> on WellbutrinSR 150Bid, Xanax 0,$RemoveBeforeD'5mg'sOeNzTlBEkseps$  prn;  We reviewed prob list, meds, xrays and labs> see below for updates >> he had the 2013 Flu vaccine 11/13  CXR 1/14 showed normal heart size, clear lungs, chr right rib deformity (old fx), NAD...  LABS 1/14:  FLP- at goals on Lip40+Niasp;  Chems- wnl;  CBC- wnl;  TSH=1.93;  PSA=0.39;  Testos=307 (350-890) on Testim 2tubes/d;  B12=1005 (211-911) on B12 2017mcg/d...  ~  November 14, 2012:  76mo ROV & Larry Stone has had a good interval w/o new complaints or concerns;  He has gained 9# up to 223# today & we reviewed diet, exercise , & wt reduction strategies...    He remains on Coreg & Amlodipine, uses Lasix just as needed & rarely needs it);  BP= 116/74 & he remains asymptomatic...    Followed by [facility] for Cards on Plavix> seen yearly in the fall & stable w/o CP, palpit, etc...    He remaiins on Lip40 along w/ CoQ10, krill oil, Fish oil> FLP 1/14 was great, continue same rx...    He had f/u colonoscopy 5/14 by DrKaplan (done w/ pt on Plavix rx)> mild divertics & 43mm polyp in desc colon removed (tubular adenoma) & repeat rec in 5 yrs...     He continues on Testim using 1 tube daily; +Viagra/Cialis for ED; he notes energy good, feeling well, no issues reported... We reviewed prob list, meds, xrays and labs> see below for updates >> OK TDAP today...  ~  July 10, 2013:  65mo ROV & Larry Stone has been stable, notes occas HAs but BP has been  OK at home, and he is under some stress...     OSA> uses Transderm scope prn dizziness, uses "breathe right nasal strips" periodically; resting OK, no daytime hypersomnolence...    HxBronchitis> on Zyrtek prn; doing satis w/o cough, phlegm, etc...    HBP> on Coreg 12.5Bid, Amlod5, Lasix40 prn; BP= 124/80 & he denies interval CP, palpit, SOB, edema...    CAD, RBBB> on Plavix75; he last saw DrMcAlhany 11/13> CAD w/ AMI 2004 & residual 50% Lmain; stable, exercising regularly, no angina & no changes made; due for Cards f/u appt...    Hyperlipid> on Lip40, CoQ10; FLP 4/15 shows TChol 133, TG 50, HDL 43, LDL 80    DM> on diet alone; wt= 223# & BMI=32; Labs 4/15 showed FBS= 115, A1c= 6.0..Marland Kitchen    GI- GERD, Divetrtics, Polyps> on Simethacone for gas; father died of colon cancer age 38; last colonoscopy 5/14 by Deatra Ina & showed divertics & 62mm polyp= tub adenoma, repeat 76yrs.    GU- Hypogonad> prev on Testim 2tubes daily, +Viagra&Cialis as needed; labs 4/15 showed PSA=0.39 and he is feeling well, good energy/ libido/ etc...    DJD, LBP> on Osteobiflex, MVI, VitD5000; stable & exercising regularly...    Memory Loss, Anxiety> on Wellbutrin & Alpraz...    ?Hx Vit B12 defic> on B12 2026mcg po daily (dosed on his own); B12 level = 1005 & ok to decr B12 supplement...    Anxiety> on WellbutrinSR 150Bid, Xanax 0,$RemoveBeforeD'5mg'WCesNALOrjebPb$  prn... We reviewed prob list, meds, xrays and labs> see below for updates >> he had the 2014 flu vaccine 11/14...  LABS 4/15:  FLP- at goals on Lip40;  Chems- ok w/ BS=115, A1c=6.0, Cr=1.2;  CBC- wnl;  TSH=1.55;  PSA=0.39...             Problem List:     ALLERGIC to ASA w/ hives, & ACE inhibitors/ ARBs too w/ angioedema.  OBSTRUCTIVE SLEEP APNEA (ICD-327.23) - s/p UPPP surgery in 2004 by DrRedman...  states he's doing OK, denies snoring, no daytime hypersom, wife not complaining... uses ZYRTEK for allergy symtoms. ~  1/13: He has started using the "breathe right nasal strips" qhs for recurrent snoring;  still says he rests well, denies daytime hypersomnolence, & declines offer for repeat sleep study to check... ~  7/14:  He reports resting well, no daytime symptoms or sleep issues...   Hx of BRONCHITIS, RECURRENT (ICD-491.9) - he is an ex-smoker, prev 1 ppd for 79yrs, quit in 2004. ~  Hx tiny RML nodule seen on Cardiac CT 2/11 & apparently unchanged from old [place] 2004... ~  10/11: f/u CT Chest for f/u tiny 35mm nodule RML- unchanged xyrs & benign,  mild centilob emphysema, +coronary calcif, NAD... ~  1/13: he denies breathing problems, intercurrent infections, etc... ~  1/14:  CXR 1/14 showed normal heart size, clear lungs, chr right rib deformity (old fx), NAD.   HYPERTENSION (ICD-401.9) - on COREG 12.$RemoveBef'5mg'UFCwKrOmGT$ Bid, AMLODIPINE $RemoveBeforeD'5mg'HGMBPEGTmQYclm$ /d, & LASIX $Remov'40mg'jJrYDX$ - only as needed (& he hasn't needed any), + KCl 105mEq daily ("DrBensimhon said to take it every day")... incr stress w/ mother's stroke and he's the care giver...  ~  8/12: BP= 118/84, taking meds regularly & tol well> denies HA, visual changes, CP, palipit, dizziness, syncope, dyspnea, edema, etc... ~  1/13: BP= 120/82 & he remains asymptomatic... ~  1/14: on Coreg 12.5Bid, Amlod5, Lasix40 prn, K20/d; BP= 120/80 & he denies interval CP, palpit, SOB, edema. ~  3/15: on Coreg 12.5Bid, Amlod5, Lasix40 prn; BP= 124/80 & he denies interval CP, palpit, SOB, edema.  ARTERIOSCLEROTIC HEART DISEASE (ICD-414.00) - on PLAVIX $RemoveB'75mg'lPZaTtph$ /d, allergic to ASA... followed by DrBensimhon and seen regularly...  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 ~  f/u Cardiac CT 2/11 showed>  IMPRESSION: Calcified plaque in the left main with < 50% stenosis.  Calcified plaque in the proximal and mid LAD with at most moderate (around 50%) stenosis in the mid LAD.  The CFX is dominant, supplying the left PDA.  The AV CFX appears patent with mild stenosis at most. There are 2 moderate, closely spaced obtuse marginal vessels that have calcified plaque  but are patent.  They are not seen well enough to comment on degree of stenosis within the vessels.  The RCA is small and nondominant. ~  Vasc Screen per Cards 10/11 showed mild carotid plaques, norm AbdAo & ABIs... ~  1/13:  He denies CP, palpit, SOB, etc; he exercises by walking regularly & using the bowflex. ~  7/13: Venous Dopplers are neg- no evid DVT etc... ~  11/13: he had Cards f/u DrMcAlhany> CAD s/p AMI (inferolat) 2004, residual 50% Lmain, HBP; doing well- walking, bowflex, etc; no angina; no changes made;  EKG 11/13 showed NSR, rate60, RBBB, inferior & lat scars, NAD... ~  3/15: he last saw DrMcAlhany 11/13> CAD w/ AMI 2004 & residual 50% Lmain; stable, exercising regularly, no angina & no changes made; due for Cards f/u appt...  HYPERLIPIDEMIA (ICD-272.4) - on LIPITOR $RemoveBe'40mg'sgHLEJZiX$ /d, NIASPAN 500Qid, FISH OIL daily... ~  FLP3/09 showed TChol 117, TG 46, HDL 38, LDL 70 ~  FLP 7/10 > pt never ret for fasting labs after this visit. ~  FLP 2/11 showed TChol 123, TG 56, HDL 46, LDL 66 ~  FLP 11/11 showed TChol 114, TG 52, HDL 39, LDL 64... Continue Lip40, Niasp4/d, & FishOil. ~  FLP 1/13 showed TChol 131, TG 50, HDL 48, LDL 73 ~  1/14: on Lip40, Niasp500, FishOil, KrillOil, CoQ10; FLP shows TChol 114, TG 31, HDL 45, LDL 63 ~  FLP 4/15 on Lip40 showed TChol 133, TG 50, HDL 43, LDL 80  DIABETES MELLITUS, BORDERLINE (ICD-790.29) - on diet alone... ~  labs 3/09 (wt=225#) showed BS= 114, HgA1c= 6.2 ~  labs 7/10 (wt=229#) > pt never ret for fasting labs after this ov. ~  labs 2/11 (wt=206#) showed BS= 93, A1c= 5.9 ~  labs 2/12 (wt=214#) showed BS= 84, A1c= 6.1.Marland KitchenMarland Kitchen Needs to lose wt. ~  Labs 1/13 (wt=219#) showed BS= 109, A1c= 6.1 ~  1/14: on diet alone; wt= 214# & FBS= 112. ~  Labs 4/15 on diet alone, weight= 223#, BS=115,  A1c=6.0  GERD (ICD-530.81) - he uses H2 blockers as needed.  DIVERTICULOSIS OF COLON (ICD-562.10) & COLONIC POLYPS (ICD-211.3) - his last polyps were ~72mm and removed in 2004=  adenomatous... last colonoscopy 10/07 by Demetra Shiner done while on his Plavix therapy showed divertics only... f/u planned 34yrs... there is a +fam hx of colon cancer in his father who died at age 40... (he tells me he would prefer to have colonoscopies every 19yrs). ~  He had f/u colonoscopy 5/14 by DrKaplan (done w/ pt on Plavix rx)> mild divertics & 31mm polyp in desc colon removed (tubular adenoma) & repeat rec in 5 yrs.   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 ~  Labs 2/12 showed Testos level = 364;  PSA= 0.43... ~  3/12:  Add-on appt at pt request for mult questions> he wants me to know that he feels terrible & that his "quality of life is going down over the last yr";  he notes that he exercises at the gym regularly & the trainer can't understand why he isn't gaining muscle, and he can't get rid of his gut despite watching his diet, & he notes sexual dysfunction, etc; he has checked everything on the internet & he wants to try TESTIM Gel for 72mo to see if this makes a diff for him; recent labs w/ Testos level = 364 (350-890) & so we will go along w/ his desire for this trial> offered consideration of other meds, and offered Urology referral for second opinion & poss Testos shots but he declines & wants to try what he has researched>  we wrote Rx for TESTIM Gel 5gm tubes- start w/ one tube rubbed into skin of arms/ shoulders dailt for several weeks & incr to 2 tubes thereafter (he says his insurance will pay for up to 3 tubes per day)... he is rec to ret for Testos level in ~29months (he will call when ready- he never did). ~  8/12:  He states he is feeling much better, still on the TESTIM 2 of the 5gm tubes rubbed into skin daily, but he oddly does not credit this intervention w/ his new found well-being etc;  He never returned for f/u Testosterone level on the gel & currently feels well enough that he doesn't care what the level is> he's going to continue current med  RX no matter what... ~  Labs 1/13 ?on Testim 2 tubes daily? showed Testosterone level = 363 (no change);  PSA= 0.48... ~  1/14: on Testim 2tubes daily, +Viagra&Cialis as needed; labs showed PSA=0.39 and Testos level = 307 with him feeling well, good energy/ libido/ etc. ~  7/14: he reports using Testim 1 tube daily 7 energy is good, feeling well, wants to continue this dose... ~  3/15: he is off the prev Testos replacement Rx> states he feels well, good energy, PSA=0.39  DEGENERATIVE JOINT DISEASE (ICD-715.90) - he reports doing fair- mostly c/o knees & hands ("I messed up my knee in a fall")... he saw Ortho ?who? on Glucosamine/ Chondroitin supplements.  LOW BACK PAIN SYNDROME (ICD-724.2)  MEMORY LOSS >>  ~  1/13: he states that his "memory is fuzzy" & "Clarity is missing"; occas he will swear that he said something but he really didn't; he's had prev scans etc & I have recommended a Neurology eval for his memory but he declines at this time "I'll think about it" he said...  ANXIETY (ICD-300.00) - on WELLBUTRIN $RemoveBefor'150mg'uUpNgDkCNyRC$ Bid, &  ALPRAZOLAM 0.'5mg'$  Prn... increased stress w/ mother's stroke last year... he's the main care giver w/ 2 sisters who Larry Stone't help much...   Health Maintenance: ~  GI:  due for f/u colonoscopy 10/12... ~  GU:  DRE is neg, PSA=0.43, Testos= 364 (350-890), on Viagra Prn... ~  Immuniztions:  he is encouraged to get the yearly Flu vaccine... given PNEUMOVAX 2/12... He reports a lot of travel & he's been to the travel clinic & had "every vaccine known to man"...   Past Surgical History  Procedure Laterality Date  . Uvulopalatopharyngoplasty      surgery for OSA  . Cardiac catheterization      Outpatient Encounter Prescriptions as of 07/10/2013  Medication Sig  . ALPRAZolam (XANAX) 0.5 MG tablet TAKE 1 TABLET BY MOUTH 3 TIMES A DAY AS NEEDED  . amLODipine (NORVASC) 5 MG tablet TAKE 1 TABLET BY MOUTH DAILY  . ammonium lactate (LAC-HYDRIN) 12 % lotion Apply topically as  directed.    Marland Kitchen atorvastatin (LIPITOR) 40 MG tablet TAKE 1 TABLET BY MOUTH EVERY DAY  . b complex vitamins tablet Take 1 tablet by mouth daily.  Marland Kitchen buPROPion (WELLBUTRIN SR) 150 MG 12 hr tablet TAKE 1 TABLET BY MOUTH TWICE A DAY  . carvedilol (COREG) 12.5 MG tablet TAKE 1 TABLET BY MOUTH TWICE A DAY WITH A MEAL  . cetirizine (ZYRTEC) 10 MG tablet Take 1 tablet (10 mg total) by mouth daily.  . Cholecalciferol (VITAMIN D3) 5000 UNITS CAPS Take one tablet by mouth daily  . clopidogrel (PLAVIX) 75 MG tablet TAKE 1 TABLET BY MOUTH DAILY  . clotrimazole-betamethasone (LOTRISONE) cream APPLY AS DIRECTED  . Coenzyme Q10 (COQ10) 100 MG CAPS Take as directed  . cyanocobalamin 2000 MCG tablet Take 1 tablet (2,000 mcg total) by mouth daily.  . furosemide (LASIX) 40 MG tablet TAKE 1 TABLET BY MOUTH DAILY as needed  . nitroGLYCERIN (NITROSTAT) 0.4 MG SL tablet Place 1 tablet (0.4 mg total) under the tongue every 5 (five) minutes as needed. For up to 3 doses.  . sildenafil (VIAGRA) 100 MG tablet Take 1 tablet (100 mg total) by mouth as needed for erectile dysfunction.  . Simethicone (GAS-X EXTRA STRENGTH) 125 MG CAPS Prn    Allergies  Allergen Reactions  . Aspirin     REACTION: allergic to ASA w/ hives...  . Codeine   . Irbesartan     REACTION: allergic to ARBs w/ angioedema  . Ramipril     REACTION: Allergic to ACE's w/ angioedema...    Current Medications, Allergies, Past Medical History, Past Surgical History, Family History, and Social History were reviewed in [employer] record.    Review of Systems       See HPI - all other systems neg except as noted...       The patient complains of dyspnea on exertion and muscle weakness.  The patient denies anorexia, fever, weight loss, weight gain, vision loss, decreased hearing, hoarseness, chest pain, syncope, peripheral edema, prolonged cough, headaches, hemoptysis, abdominal pain, melena, hematochezia, severe  indigestion/heartburn, hematuria, incontinence, suspicious skin lesions, transient blindness, difficulty walking, depression, unusual weight change, abnormal bleeding, enlarged lymph nodes, and angioedema.     Objective:   Physical Exam    WD, sl overweight, 63 y/o WM in NAD... GENERAL:  Alert & oriented; pleasant & cooperative... HEENT:  [place]/AT, EOM-wnl, PERRLA, EACs-clear, TMs-wnl, NOSE-clear, THROAT-s/p UPPP surg... NECK:  Supple w/ fairROM; no JVD; normal carotid impulses w/o bruits; no thyromegaly or nodules  palpated; no lymphadenopathy. CHEST:  Clear to P & A; without wheezes/ rales/ or rhonchi. HEART:  Regular Rhythm; without murmurs/ rubs/ or gallops. ABDOMEN:  Soft & nontender; normal bowel sounds; no organomegaly or masses detected. EXT: without deformities, mild arthritic changes; no varicose veins/ venous insuffic/ or edema. Neuro:  intact w/o focal abn detected... DERM:  No lesions noted; no rash etc...  RADIOLOGY DATA:  Reviewed in the EPIC EMR & discussed w/ the patient...   LABORATORY DATA:  Reviewed in the EPIC EMR & discussed w/ the patient...     Assessment & Plan:    Hx OSA, s/p UPPP in 2004>  noted recurrent snoring & using breathe right nasal strips; he doesn't want repeat sleep study or further eval, just want Rx for the strips so he can pay for them w/ his flex acct...  Hx recurrent bronchitis>  He quit smoking in 2004 & no recent bronchitic exacerbations...  HBP>  Controlled on BBlocker, CCB, Diuretic; continue same...  CAD>  Followed by Vivi Barrack on above + Plavix, he is allergic to ASA...  Ven Insuffic, Edema>  We reviewed the pathophysiology & the need for no salt, elevation, support hse, Lasix40;  ven dopplers (7/13) were neg/ normal/ no DVT...  HYPERLIPIDEMIA>  On Liptor & CoQ10 w/ good numbers at goal...  Borderline DM>  On diet alone & doing well...  GI> GERD, Divertics, Polyps>  Stable, f/u colon 5/14 w/ adenoma removed & next f/u due in  26yrs...  DJD/ LBP>  He treats self w/ OTC meds...  ANXIETY>  On Wellbutrin & Alprazolam, stable & he wants to continue the same.Marland KitchenMarland Kitchen

## 2013-07-12 ENCOUNTER — Other Ambulatory Visit: Payer: Self-pay | Admitting: Pulmonary Disease

## 2013-07-12 ENCOUNTER — Telehealth: Payer: Self-pay | Admitting: Pulmonary Disease

## 2013-07-12 MED ORDER — AZITHROMYCIN 250 MG PO TABS: ORAL_TABLET | ORAL | Status: DC

## 2013-07-12 MED ORDER — METHYLPREDNISOLONE 4 MG PO KIT: PACK | ORAL | Status: DC

## 2013-07-12 NOTE — Telephone Encounter (Signed)
Per SN: okay for zpak and medrol dosepak.  Thanks.  Called spoke with patient and advised of SN's recs as stated above.  Pt okay with these recs and verbalized his understanding.  Rx's sent to verified pharmacy.  Pt aware to call back if symptoms persist or worsen.  Nothing further needed; will sign off.

## 2013-07-12 NOTE — Telephone Encounter (Signed)
Pt is calling in b/c he hadn't heard back from the doc yet.  Please call asap.  Larry Stone

## 2013-07-12 NOTE — Telephone Encounter (Signed)
Spoke with pt. C/o wheezing, dry cough, chest tx, increase SOB w/ activity/rest x last night. Feels like he is beginning to get bronchitis. Pt requesting recs. Please advise SN thanks  Allergies  Allergen Reactions  . Aspirin     REACTION: allergic to ASA w/ hives...  . Codeine   . Irbesartan     REACTION: allergic to ARBs w/ angioedema  . Ramipril     REACTION: Allergic to ACE's w/ angioedema.Marland KitchenMarland Kitchen

## 2013-07-17 ENCOUNTER — Other Ambulatory Visit: Payer: Self-pay | Admitting: Cardiovascular Disease

## 2013-07-17 ENCOUNTER — Other Ambulatory Visit: Payer: Self-pay | Admitting: Pulmonary Disease

## 2013-07-18 ENCOUNTER — Other Ambulatory Visit: Payer: Self-pay | Admitting: Pulmonary Disease

## 2013-08-16 ENCOUNTER — Other Ambulatory Visit (INDEPENDENT_AMBULATORY_CARE_PROVIDER_SITE_OTHER): Payer: 59

## 2013-08-16 DIAGNOSIS — E291 Testicular hypofunction: Secondary | ICD-10-CM

## 2013-08-16 DIAGNOSIS — R7309 Other abnormal glucose: Secondary | ICD-10-CM

## 2013-08-16 DIAGNOSIS — D126 Benign neoplasm of colon, unspecified: Secondary | ICD-10-CM

## 2013-08-16 DIAGNOSIS — E785 Hyperlipidemia, unspecified: Secondary | ICD-10-CM

## 2013-08-16 DIAGNOSIS — F411 Generalized anxiety disorder: Secondary | ICD-10-CM

## 2013-08-16 DIAGNOSIS — I10 Essential (primary) hypertension: Secondary | ICD-10-CM

## 2013-08-16 LAB — CBC WITH DIFFERENTIAL/PLATELET
Basophils Absolute: 0 10*3/uL (ref 0.0–0.1)
Basophils Relative: 0.4 % (ref 0.0–3.0)
EOS ABS: 0.3 10*3/uL (ref 0.0–0.7)
Eosinophils Relative: 3.4 % (ref 0.0–5.0)
HCT: 42.4 % (ref 39.0–52.0)
Hemoglobin: 14.5 g/dL (ref 13.0–17.0)
LYMPHS PCT: 22.9 % (ref 12.0–46.0)
Lymphs Abs: 1.7 10*3/uL (ref 0.7–4.0)
MCHC: 34.1 g/dL (ref 30.0–36.0)
MCV: 94.4 fl (ref 78.0–100.0)
Monocytes Absolute: 0.7 10*3/uL (ref 0.1–1.0)
Monocytes Relative: 9.5 % (ref 3.0–12.0)
NEUTROS ABS: 4.7 10*3/uL (ref 1.4–7.7)
NEUTROS PCT: 63.8 % (ref 43.0–77.0)
Platelets: 152 10*3/uL (ref 150.0–400.0)
RBC: 4.49 Mil/uL (ref 4.22–5.81)
RDW: 13.8 % (ref 11.5–14.6)
WBC: 7.3 10*3/uL (ref 4.5–10.5)

## 2013-08-16 LAB — HEMOGLOBIN A1C: HEMOGLOBIN A1C: 6 % (ref 4.6–6.5)

## 2013-08-16 LAB — BASIC METABOLIC PANEL
BUN: 18 mg/dL (ref 6–23)
CHLORIDE: 103 meq/L (ref 96–112)
CO2: 29 meq/L (ref 19–32)
Calcium: 9.1 mg/dL (ref 8.4–10.5)
Creatinine, Ser: 1.2 mg/dL (ref 0.4–1.5)
GFR: 66.22 mL/min (ref >60.00)
Glucose, Bld: 115 mg/dL — ABNORMAL HIGH (ref 70–99)
Potassium: 5 mEq/L (ref 3.5–5.1)
SODIUM: 139 meq/L (ref 135–145)

## 2013-08-16 LAB — HEPATIC FUNCTION PANEL
ALK PHOS: 82 U/L (ref 39–117)
ALT: 27 U/L (ref 0–53)
AST: 19 U/L (ref 0–37)
Albumin: 3.8 g/dL (ref 3.5–5.2)
BILIRUBIN DIRECT: 0.1 mg/dL (ref 0.0–0.3)
TOTAL PROTEIN: 6.2 g/dL (ref 6.0–8.3)
Total Bilirubin: 1.1 mg/dL (ref 0.3–1.2)

## 2013-08-16 LAB — LIPID PANEL
Cholesterol: 133 mg/dL (ref 0–200)
HDL: 42.7 mg/dL (ref >39.00)
LDL Cholesterol: 80 mg/dL (ref 0–99)
Total CHOL/HDL Ratio: 3
Triglycerides: 50 mg/dL (ref 0.0–149.0)
VLDL: 10 mg/dL (ref 0.0–40.0)

## 2013-08-16 LAB — PSA: PSA: 0.39 ng/mL (ref 0.10–4.00)

## 2013-08-16 LAB — TSH: TSH: 1.55 u[IU]/mL (ref 0.35–5.50)

## 2013-08-20 ENCOUNTER — Ambulatory Visit (INDEPENDENT_AMBULATORY_CARE_PROVIDER_SITE_OTHER): Payer: 59 | Admitting: Cardiovascular Disease

## 2013-08-20 ENCOUNTER — Encounter: Payer: Self-pay | Admitting: Cardiovascular Disease

## 2013-08-20 VITALS — BP 122/73 | HR 59 | Ht 70.0 in | Wt 221.2 lb

## 2013-08-20 DIAGNOSIS — E785 Hyperlipidemia, unspecified: Secondary | ICD-10-CM

## 2013-08-20 DIAGNOSIS — I2510 Atherosclerotic heart disease of native coronary artery without angina pectoris: Secondary | ICD-10-CM

## 2013-08-20 DIAGNOSIS — I10 Essential (primary) hypertension: Secondary | ICD-10-CM

## 2013-08-20 NOTE — Progress Notes (Signed)
History of Present Illness: 63 yo male with history of CAD, HLD, HTN, OSA here today for cardiac follow up. He has been followed in the past by Dr. Haroldine Laws. 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 He also has residual 50% left main stenosis and nonobstructive disease elsewhere. Cardiac CT in 05/2009 with LM < 50% LAD 50% LCX dominant nonobs RCA small no critical lesions. Small lung nodule. F/u CT showed stable pulm nodule. Carotid u/s 11/11 with minimal carotid plaque.   He is here today for follow up. He is doing well. He has not been exercising every day. No CP or SOB.   Primary Care Physician: Teressa Lower  Last Lipid Profile:Lipid Panel     Component Value Date/Time   CHOL 133 08/16/2013 1015   TRIG 50.0 08/16/2013 1015   HDL 42.70 08/16/2013 1015   CHOLHDL 3 08/16/2013 1015   VLDL 10.0 08/16/2013 1015   LDLCALC 80 08/16/2013 1015     Past Medical History  Diagnosis Date  . CAD     --8/09: ETT normal --acute inferior lateral wall infarction in September 2004 treated medically.  --cardiac CT 2006  residual 50% left main stenosis and nonobstructive disease elsewhere. EF normal   --cardiac CT 05/2009. LM. <50% LAD. 50%  . HTN (hypertension)   . Hyperlipidemia   . Pharyngitis   . URI (upper respiratory infection)   . OSA (obstructive sleep apnea)   . Recurrent aspiration bronchitis/pneumonia   . Diabetes mellitus   . GERD (gastroesophageal reflux disease)   . Diverticulosis   . Colonic polyp   . DJD (degenerative joint disease)   . Low back pain syndrome   . Anxiety   . Myocardial infarction 2004    Past Surgical History  Procedure Laterality Date  . Uvulopalatopharyngoplasty      surgery for OSA  . Cardiac catheterization      Current Outpatient Prescriptions  Medication Sig Dispense Refill  . ALPRAZolam (XANAX) 0.5 MG tablet TAKE 1 TABLET BY MOUTH 3 TIMES A DAY AS NEEDED  90 tablet  0  . amLODipine (NORVASC) 5 MG tablet TAKE 1  TABLET BY MOUTH DAILY  30 tablet  0  . ammonium lactate (LAC-HYDRIN) 12 % lotion Apply topically as directed.        Marland Kitchen atorvastatin (LIPITOR) 40 MG tablet TAKE 1 TABLET BY MOUTH EVERY DAY  30 tablet  0  . b complex vitamins tablet Take 1 tablet by mouth daily.      Marland Kitchen buPROPion (WELLBUTRIN SR) 150 MG 12 hr tablet TAKE 1 TABLET BY MOUTH TWICE A DAY  60 tablet  5  . carvedilol (COREG) 12.5 MG tablet TAKE 1 TABLET BY MOUTH TWICE A DAY WITH A MEAL  60 tablet  0  . cetirizine (ZYRTEC) 10 MG tablet Take 1 tablet (10 mg total) by mouth daily.  30 tablet  11  . Cholecalciferol (VITAMIN D3) 5000 UNITS CAPS Take one tablet by mouth daily      . clopidogrel (PLAVIX) 75 MG tablet TAKE 1 TABLET BY MOUTH DAILY  30 tablet  0  . clotrimazole-betamethasone (LOTRISONE) cream APPLY AS DIRECTED  45 g  11  . Coenzyme Q10 (COQ10) 100 MG CAPS Take as directed  30 each  11  . cyanocobalamin 2000 MCG tablet Take 1 tablet (2,000 mcg total) by mouth daily.  30 tablet  11  . furosemide (LASIX) 40 MG tablet TAKE 1 TABLET BY MOUTH DAILY  30 tablet  0  . nitroGLYCERIN (NITROSTAT) 0.4 MG SL tablet Place 1 tablet (0.4 mg total) under the tongue every 5 (five) minutes as needed. For up to 3 doses.  25 tablet  6  . sildenafil (VIAGRA) 100 MG tablet Take 1 tablet (100 mg total) by mouth as needed for erectile dysfunction.  15 tablet  3  . Simethicone (GAS-X EXTRA STRENGTH) 125 MG CAPS Prn       No current facility-administered medications for this visit.    Allergies  Allergen Reactions  . Aspirin     REACTION: allergic to ASA w/ hives...  . Codeine   . Irbesartan     REACTION: allergic to ARBs w/ angioedema  . Ramipril     REACTION: Allergic to ACE's w/ angioedema...    History   Social History  . Marital Status: Married    Spouse Name: N/A    Number of Children: 1  . Years of Education: N/A   Occupational History  . [employer] History Main Topics  . Smoking status: Former  Smoker -- 1.00 packs/day for 20 years    Types: Cigarettes, Cigars    Quit date: 05/08/1998  . Smokeless tobacco: Never Used     Comment: quit in 2005  . Alcohol Use: 0.5 oz/week    1 drink(s) per week     Comment: social drinker  . Drug Use: No  . Sexual Activity: Not on file   Other Topics Concern  . Not on file   Social History Narrative  . No narrative on file    Family History  Problem Relation Age of Onset  . Colon cancer Father   . Stroke Mother   . Hypertension Sister   . Diabetes Sister     Review of Systems:  As stated in the HPI and otherwise negative.   BP 122/73  Pulse 59  Ht 5\' 10"  (1.778 m)  Wt 221 lb 3.2 oz (100.336 kg)  BMI 31.74 kg/m2  Physical Examination: General: Well developed, well nourished, NAD HEENT: OP clear, mucus membranes moist SKIN: warm, dry. No rashes. Neuro: No focal deficits Musculoskeletal: Muscle strength 5/5 all ext Psychiatric: Mood and affect normal Neck: No JVD, no carotid bruits, no thyromegaly, no lymphadenopathy. Lungs:Clear bilaterally, no wheezes, rhonci, crackles Cardiovascular: Regular rate and rhythm. No murmurs, gallops or rubs. Abdomen:Soft. Bowel sounds present. Non-tender.  Extremities: No lower extremity edema. Pulses are 2 + in the bilateral DP/PT.  VEH:MCNOB brady, rate 59 bpm. 1st degree AV block. RBBB. Old inferior MI with lateral involvement.   Assessment and Plan:  1. CAD: Doing well. Continue current regimen. Will arrange screening treadmill stress test to exclude ischemia. Will arrange echo to assess LV function.   2. HYPERTENSION:  Blood pressure well controlled. Continue current regimen.   3. HYPERLIPIDEMIA: LDL at goal. Continue current regimen.

## 2013-08-20 NOTE — Patient Instructions (Signed)
Your physician wants you to follow-up in:  12 months.  You will receive a reminder letter in the mail two months in advance. If you don't receive a letter, please call our office to schedule the follow-up appointment.  Your physician has requested that you have an exercise tolerance test. Can be done with PA or NP or at [facility] office.  For further information please visit [email or web address]. Please also follow instruction sheet, as given.   Your physician has requested that you have an echocardiogram. Echocardiography is a painless test that uses sound waves to create images of your heart. It provides your doctor with information about the size and shape of your heart and how well your heart's chambers and valves are working. This procedure takes approximately one hour. There are no restrictions for this procedure.

## 2013-08-22 ENCOUNTER — Other Ambulatory Visit: Payer: Self-pay | Admitting: Pulmonary Disease

## 2013-08-22 ENCOUNTER — Other Ambulatory Visit: Payer: Self-pay | Admitting: Cardiovascular Disease

## 2013-08-23 ENCOUNTER — Telehealth: Payer: Self-pay | Admitting: Pulmonary Disease

## 2013-08-23 NOTE — Telephone Encounter (Signed)
Called spoke with pt. He reports his pharm advised him we denied his refills. I advised him th refill request we received was for abx and prednisone. Pt reports he does not need that. He is going to figure out what he needs and call back. Will await call back

## 2013-08-27 NOTE — Telephone Encounter (Signed)
Refills sent on 08-22-13. [place] Bing, CMA

## 2013-08-28 ENCOUNTER — Telehealth (HOSPITAL_COMMUNITY): Payer: Self-pay

## 2013-08-29 ENCOUNTER — Ambulatory Visit: Payer: 59 | Admitting: Physician Assistant

## 2013-08-30 ENCOUNTER — Other Ambulatory Visit: Payer: Self-pay

## 2013-08-30 ENCOUNTER — Ambulatory Visit (HOSPITAL_BASED_OUTPATIENT_CLINIC_OR_DEPARTMENT_OTHER)
Admission: RE | Admit: 2013-08-30 | Discharge: 2013-08-30 | Disposition: A | Discharge status: Home / Self Care | Payer: 59 | Source: Ambulatory Visit | Attending: Cardiovascular Disease | Admitting: Cardiovascular Disease

## 2013-08-30 ENCOUNTER — Ambulatory Visit (HOSPITAL_COMMUNITY)
Admission: RE | Admit: 2013-08-30 | Discharge: 2013-08-30 | Disposition: A | Discharge status: Home / Self Care | Payer: 59 | Source: Ambulatory Visit | Attending: Cardiology | Admitting: Cardiology

## 2013-08-30 DIAGNOSIS — I10 Essential (primary) hypertension: Secondary | ICD-10-CM | POA: Insufficient documentation

## 2013-08-30 DIAGNOSIS — I519 Heart disease, unspecified: Secondary | ICD-10-CM

## 2013-08-30 DIAGNOSIS — E119 Type 2 diabetes mellitus without complications: Secondary | ICD-10-CM | POA: Insufficient documentation

## 2013-08-30 DIAGNOSIS — I252 Old myocardial infarction: Secondary | ICD-10-CM | POA: Insufficient documentation

## 2013-08-30 DIAGNOSIS — I2510 Atherosclerotic heart disease of native coronary artery without angina pectoris: Secondary | ICD-10-CM

## 2013-08-30 DIAGNOSIS — E785 Hyperlipidemia, unspecified: Secondary | ICD-10-CM | POA: Insufficient documentation

## 2013-08-30 NOTE — Progress Notes (Signed)
2D Echo Performed 08/30/2013    Marygrace Drought, RCS

## 2013-08-31 ENCOUNTER — Telehealth: Payer: Self-pay | Admitting: Cardiovascular Disease

## 2013-08-31 DIAGNOSIS — I2510 Atherosclerotic heart disease of native coronary artery without angina pectoris: Secondary | ICD-10-CM

## 2013-08-31 NOTE — Telephone Encounter (Signed)
Stress test is ok. He went over 7 minutes and stopped due to knee pain. No ischemia at submaximal stress. cdm

## 2013-09-05 ENCOUNTER — Telehealth: Payer: Self-pay | Admitting: Cardiovascular Disease

## 2013-09-05 NOTE — Addendum Note (Signed)
Addended by: Thompson Grayer on: 09/05/2013 05:11 PM   Modules accepted: Orders

## 2013-09-05 NOTE — Telephone Encounter (Signed)
Spoke with pt and reviewed stress test results with him.  He has many questions about further in depth testing. He would like to know if testing has changed since his event 12 years ago and would like to know if there are additional tests he should have. He is asking if he should have further stress testing.

## 2013-09-05 NOTE — Telephone Encounter (Signed)
New message ° ° ° ° ° °Returning Pat's call °

## 2013-09-05 NOTE — Telephone Encounter (Signed)
See call dated 5/20 for documentation.

## 2013-09-05 NOTE — Telephone Encounter (Signed)
New message    Patient calling stating he has no idea why the nurse is calling him.

## 2013-09-05 NOTE — Telephone Encounter (Signed)
Left message to call back  

## 2013-09-05 NOTE — Telephone Encounter (Signed)
Since exercise stress was sub-optimal, we should arrange Lexiscan myoview. Thanks, chris

## 2013-09-05 NOTE — Telephone Encounter (Signed)
Follow Up  Pt called to follow Up//SR

## 2013-09-05 NOTE — Telephone Encounter (Signed)
Spoke with pt and reviewed stress test and echo results with him.

## 2013-09-05 NOTE — Telephone Encounter (Signed)
Pt notified and would like to proceed with Lexiscan.  Will have schedulers contact him to schedule this.  I verbally went over all instructions with pt and will mail copy to him.

## 2013-09-05 NOTE — Telephone Encounter (Signed)
See phone note dated 08/31/13 for documentation related to this call.

## 2013-09-11 ENCOUNTER — Ambulatory Visit (INDEPENDENT_AMBULATORY_CARE_PROVIDER_SITE_OTHER): Payer: 59 | Admitting: Internal Medicine

## 2013-09-11 ENCOUNTER — Encounter: Payer: Self-pay | Admitting: Internal Medicine

## 2013-09-11 VITALS — BP 138/72 | HR 63 | Temp 98.0°F | Resp 18 | Ht 69.75 in | Wt 225.0 lb

## 2013-09-11 DIAGNOSIS — E785 Hyperlipidemia, unspecified: Secondary | ICD-10-CM

## 2013-09-11 DIAGNOSIS — I2510 Atherosclerotic heart disease of native coronary artery without angina pectoris: Secondary | ICD-10-CM

## 2013-09-11 DIAGNOSIS — I10 Essential (primary) hypertension: Secondary | ICD-10-CM

## 2013-09-11 DIAGNOSIS — R7309 Other abnormal glucose: Secondary | ICD-10-CM

## 2013-09-11 MED ORDER — ALPRAZOLAM 0.5 MG PO TABS: ORAL_TABLET | ORAL | Status: DC

## 2013-09-11 MED ORDER — CLOTRIMAZOLE-BETAMETHASONE 1-0.05 % EX CREA: TOPICAL_CREAM | CUTANEOUS | Status: DC

## 2013-09-11 MED ORDER — AMMONIUM LACTATE 12 % EX LOTN: TOPICAL_LOTION | CUTANEOUS | Status: DC

## 2013-09-11 MED ORDER — BUPROPION HCL ER (SR) 150 MG PO TB12: ORAL_TABLET | ORAL | Status: DC

## 2013-09-11 NOTE — Progress Notes (Signed)
   Subjective:     HPI  New pt - Dr Lenna Gilford  The patient presents for a follow-up of  chronic hypertension, chronic dyslipidemia, CAD controlled with medicines CL next week  BP Readings from Last 3 Encounters:  09/11/13 138/72  08/20/13 122/73  07/10/13 124/80   Wt Readings from Last 3 Encounters:  09/11/13 225 lb (102.059 kg)  08/20/13 221 lb 3.2 oz (100.336 kg)  07/10/13 223 lb (101.152 kg)       Review of Systems  Constitutional: Negative for appetite change, fatigue and unexpected weight change.  HENT: Negative for congestion, nosebleeds, sneezing, sore throat and trouble swallowing.   Eyes: Negative for itching and visual disturbance.  Respiratory: Negative for cough.   Cardiovascular: Negative for chest pain, palpitations and leg swelling.  Gastrointestinal: Negative for nausea, diarrhea, blood in stool and abdominal distention.  Genitourinary: Negative for frequency and hematuria.  Musculoskeletal: Negative for back pain, gait problem, joint swelling and neck pain.  Skin: Negative for rash.  Neurological: Negative for dizziness, tremors, speech difficulty and weakness.  Psychiatric/Behavioral: Negative for sleep disturbance, dysphoric mood and agitation. The patient is not nervous/anxious.        Objective:   Physical Exam  Constitutional: He is oriented to person, place, and time. He appears well-developed. No distress.  Obese NAD  HENT:  Mouth/Throat: Oropharynx is clear and moist.  Eyes: Conjunctivae are normal. Pupils are equal, round, and reactive to light.  Neck: Normal range of motion. No JVD present. No thyromegaly present.  Cardiovascular: Normal rate, regular rhythm, normal heart sounds and intact distal pulses.  Exam reveals no gallop and no friction rub.   No murmur heard. Pulmonary/Chest: Effort normal and breath sounds normal. No respiratory distress. He has no wheezes. He has no rales. He exhibits no tenderness.  Abdominal: Soft. Bowel sounds  are normal. He exhibits no distension and no mass. There is no tenderness. There is no rebound and no guarding.  Musculoskeletal: Normal range of motion. He exhibits no edema and no tenderness.  Lymphadenopathy:    He has no cervical adenopathy.  Neurological: He is alert and oriented to person, place, and time. He has normal reflexes. No cranial nerve deficit. He exhibits normal muscle tone. He displays a negative Romberg sign. Coordination and gait normal.  No meningeal signs  Skin: Skin is warm and dry. No rash noted.  Psychiatric: He has a normal mood and affect. His behavior is normal. Judgment and thought content normal.    Lab Results  Component Value Date   WBC 7.3 08/16/2013   HGB 14.5 08/16/2013   HCT 42.4 08/16/2013   PLT 152.0 08/16/2013   GLUCOSE 115* 08/16/2013   CHOL 133 08/16/2013   TRIG 50.0 08/16/2013   HDL 42.70 08/16/2013   LDLCALC 80 08/16/2013   ALT 27 08/16/2013   AST 19 08/16/2013   NA 139 08/16/2013   K 5.0 08/16/2013   CL 103 08/16/2013   CREATININE 1.2 08/16/2013   BUN 18 08/16/2013   CO2 29 08/16/2013   TSH 1.55 08/16/2013   PSA 0.39 08/16/2013   HGBA1C 6.0 08/16/2013         Assessment & Plan:

## 2013-09-11 NOTE — Progress Notes (Signed)
Pre visit review using our clinic review tool, if applicable. No additional management support is needed unless otherwise documented below in the visit note. 

## 2013-09-12 ENCOUNTER — Encounter: Payer: Self-pay | Admitting: Internal Medicine

## 2013-09-12 NOTE — Assessment & Plan Note (Signed)
Labs

## 2013-09-12 NOTE — Assessment & Plan Note (Signed)
Continue with current prescription therapy as reflected on the Med list.  

## 2013-09-14 ENCOUNTER — Telehealth: Payer: Self-pay | Admitting: Pulmonary Disease

## 2013-09-14 MED ORDER — AZITHROMYCIN 250 MG PO TABS: ORAL_TABLET | ORAL | Status: DC

## 2013-09-14 MED ORDER — PREDNISONE (PAK) 5 MG PO TABS: ORAL_TABLET | ORAL | Status: DC

## 2013-09-14 NOTE — Telephone Encounter (Signed)
Per SN---  Ok to send in zpak and pred dosepak.   Called and spoke with pt and he is aware of rx that have been sent to the pharmacy.  Nothing further is needed.

## 2013-09-14 NOTE — Telephone Encounter (Signed)
Called and spoke with pt and he stated that he has the bronchitis again.  Chest congestion/cough but not able to cough anything up.  Pt stated that the last time he had this we gave him a pred dosepak and zpak and this cleared it right up. Pt is requesting the same to be called in again.  SN please advise. Thanks  Allergies  Allergen Reactions  . Aspirin     REACTION: allergic to ASA w/ hives...  . Codeine   . Irbesartan     REACTION: allergic to ARBs w/ angioedema  . Ramipril     REACTION: Allergic to ACE's w/ angioedema...    Current Outpatient Prescriptions on File Prior to Visit  Medication Sig Dispense Refill  . ALPRAZolam (XANAX) 0.5 MG tablet TAKE 1 TABLET BY MOUTH 3 TIMES A DAY AS NEEDED  90 tablet  1  . amLODipine (NORVASC) 5 MG tablet TAKE 1 TABLET BY MOUTH DAILY  90 tablet  1  . ammonium lactate (LAC-HYDRIN) 12 % lotion Apply topically as directed.  400 g  3  . atorvastatin (LIPITOR) 40 MG tablet TAKE 1 TABLET BY MOUTH EVERY DAY  90 tablet  1  . b complex vitamins tablet Take 1 tablet by mouth daily.      Marland Kitchen buPROPion (WELLBUTRIN SR) 150 MG 12 hr tablet TAKE 1 TABLET BY MOUTH TWICE A DAY  60 tablet  11  . carvedilol (COREG) 12.5 MG tablet TAKE 1 TABLET BY MOUTH TWICE A DAY WITH A MEAL  180 tablet  1  . cetirizine (ZYRTEC) 10 MG tablet Take 1 tablet (10 mg total) by mouth daily.  30 tablet  11  . Cholecalciferol (VITAMIN D3) 5000 UNITS CAPS Take one tablet by mouth daily      . clopidogrel (PLAVIX) 75 MG tablet TAKE 1 TABLET BY MOUTH DAILY  90 tablet  1  . clotrimazole-betamethasone (LOTRISONE) cream APPLY AS DIRECTED  45 g  11  . Coenzyme Q10 (COQ10) 100 MG CAPS Take as directed  30 each  11  . cyanocobalamin 2000 MCG tablet Take 1 tablet (2,000 mcg total) by mouth daily.  30 tablet  11  . furosemide (LASIX) 40 MG tablet TAKE 1 TABLET BY MOUTH DAILY  90 tablet  1  . nitroGLYCERIN (NITROSTAT) 0.4 MG SL tablet Place 1 tablet (0.4 mg total) under the tongue every 5 (five) minutes  as needed. For up to 3 doses.  25 tablet  6  . sildenafil (VIAGRA) 100 MG tablet Take 1 tablet (100 mg total) by mouth as needed for erectile dysfunction.  15 tablet  3  . Simethicone (GAS-X EXTRA STRENGTH) 125 MG CAPS Prn       No current facility-administered medications on file prior to visit.

## 2013-09-14 NOTE — Telephone Encounter (Signed)
[phone] needs something help asap really feels bad

## 2013-09-20 ENCOUNTER — Encounter: Payer: Self-pay | Admitting: Cardiology

## 2013-09-20 ENCOUNTER — Encounter (HOSPITAL_COMMUNITY): Payer: 59

## 2013-09-20 ENCOUNTER — Encounter: Payer: Self-pay | Admitting: Internal Medicine

## 2013-09-24 ENCOUNTER — Telehealth: Payer: Self-pay | Admitting: Pulmonary Disease

## 2013-09-24 MED ORDER — PREDNISONE 10 MG PO TABS: ORAL_TABLET | ORAL | Status: DC

## 2013-09-24 MED ORDER — AZITHROMYCIN 250 MG PO TABS: ORAL_TABLET | ORAL | Status: DC

## 2013-09-24 NOTE — Telephone Encounter (Signed)
Pt calling c/o chest congestion, productive cough with yellow mucus and wheezing at times. No change since 09/14/13 Denies fever. Difficulty sleeping. Last seen 06/2013 by SN Given Zpak and Pred taper 09/14/13  Allergies  Allergen Reactions  . Aspirin     REACTION: allergic to ASA w/ hives...  . Codeine   . Irbesartan     REACTION: allergic to ARBs w/ angioedema  . Ramipril     REACTION: Allergic to ACE's w/ angioedema...   Please advise Tammy Parrett as Dr Lenna Gilford is not in office today. Thanks.

## 2013-09-24 NOTE — Telephone Encounter (Signed)
Per TP: okay for another zpak and prednisone 8 day taper.  If not better, or symptoms worsen needs ov.  Called spoke with patient who reported that his symptoms are improved with the 5/29 zpak and medrol dose pak, but not completely resolved.  Would like to try another zpak.  Pt requests rx's be sent to [facility].  Pt aware to call the office for ov if symptoms do not improve or they worsen.  Nothing further needed at this time; will sign off.

## 2013-10-03 ENCOUNTER — Ambulatory Visit (HOSPITAL_COMMUNITY): Payer: 59 | Attending: Cardiology | Admitting: Radiology

## 2013-10-03 VITALS — BP 125/81 | Ht 70.0 in | Wt 224.0 lb

## 2013-10-03 DIAGNOSIS — I10 Essential (primary) hypertension: Secondary | ICD-10-CM | POA: Insufficient documentation

## 2013-10-03 DIAGNOSIS — R079 Chest pain, unspecified: Secondary | ICD-10-CM

## 2013-10-03 DIAGNOSIS — I2510 Atherosclerotic heart disease of native coronary artery without angina pectoris: Secondary | ICD-10-CM | POA: Insufficient documentation

## 2013-10-03 DIAGNOSIS — I252 Old myocardial infarction: Secondary | ICD-10-CM | POA: Insufficient documentation

## 2013-10-03 DIAGNOSIS — I779 Disorder of arteries and arterioles, unspecified: Secondary | ICD-10-CM | POA: Insufficient documentation

## 2013-10-03 DIAGNOSIS — Z87891 Personal history of nicotine dependence: Secondary | ICD-10-CM | POA: Insufficient documentation

## 2013-10-03 MED ORDER — TECHNETIUM TC 99M SESTAMIBI GENERIC - CARDIOLITE
33.0000 | Freq: Once | INTRAVENOUS | Status: AC | PRN
  Administered 2013-10-03: 33 via INTRAVENOUS

## 2013-10-03 MED ORDER — REGADENOSON 0.4 MG/5ML IV SOLN
0.4000 mg | Freq: Once | INTRAVENOUS | Status: AC
  Administered 2013-10-03: 0.4 mg via INTRAVENOUS

## 2013-10-03 MED ORDER — TECHNETIUM TC 99M SESTAMIBI GENERIC - CARDIOLITE
10.8000 | Freq: Once | INTRAVENOUS | Status: AC | PRN
  Administered 2013-10-03: 11 via INTRAVENOUS

## 2013-10-03 NOTE — Progress Notes (Signed)
[facility] [address] [place], [place] [address] [phone]    Cardiology Nuclear Med Study  DA AUTHEMENT is a 63 y.o. male     MRN : [id number]     DOB: 18-Apr-1951  Procedure Date: 10/03/2013  Nuclear Med Background Indication for Stress Test:  Evaluation for Ischemia History:  CAD-MI-CATH 5/15 ECHO: EF: 55-60% '04 MPI: EF: 49% per-infarct Scar ischemia, mid anterolateral infarct Cardiac Risk Factors: Carotid Disease, History of Smoking, Hypertension and Lipids  Symptoms:  Chest Pain   Nuclear Pre-Procedure Caffeine/Decaff Intake:  None NPO After: 7:00pm   Lungs:  clear O2 Sat: 98% on room air. IV 0.9% NS with Angio Cath:  22g  IV Site: R Hand  IV Started by:  Crissie Figures, RN  Chest Size (in):  48 Cup Size: n/a  Height: 5\' 10"  (1.778 m)  Weight:  224 lb (101.606 kg)  BMI:  Body mass index is 32.14 kg/(m^2). Tech Comments:  N/A    Nuclear Med Study 1 or 2 day study: 1 day  Stress Test Type:  Lexiscan  Reading MD: N/A  Order Authorizing Provider:  Lauree Chandler, MD  Resting Radionuclide: Technetium 69m Sestamibi  Resting Radionuclide Dose: 11.0 mCi   Stress Radionuclide:  Technetium 51m Sestamibi  Stress Radionuclide Dose: 33.0 mCi           Stress Protocol Rest HR: 63 Stress HR: 83  Rest BP: 125/81 Stress BP: 137/73  Exercise Time (min): n/a METS: n/a   Predicted Max HR: 157 bpm % Max HR: 52.87 bpm Rate Pressure Product: 11371   Dose of Adenosine (mg):  n/a Dose of Lexiscan: 0.4 mg  Dose of Atropine (mg): n/a Dose of Dobutamine: n/a mcg/kg/min (at max HR)  Stress Test Technologist: Perrin Maltese, EMT-P  Nuclear Technologist:  Charlton Amor, CNMT     Rest Procedure:  Myocardial perfusion imaging was performed at rest 45 minutes following the intravenous administration of Technetium 44m Sestamibi. Rest ECG: SR, Q in the inferolateral leads  Stress Procedure:  The patient received IV Lexiscan 0.4 mg over  15-seconds.  Technetium 104m Sestamibi injected at 30-seconds. This patient had sob and an "Uncomfortable Fullness" with the Lexiscan injection. Quantitative spect images were obtained after a 45 minute delay. Stress ECG: No significant change from baseline ECG  QPS Raw Data Images:  Normal; no motion artifact; normal heart/lung ratio. Stress Images:  There is a large size severe severity perfusion defect in the basal and mid inferior and inferolateral walls.  Rest Images:  There is a large size severe severity perfusion defect in the basal and mid inferior and inferolateral walls.  Subtraction (SDS):  SDS 2 Transient Ischemic Dilatation (Normal <1.22):  1.06 Lung/Heart Ratio (Normal <0.45):  0.32  Quantitative Gated Spect Images QGS EDV:  135 ml QGS ESV:  65 ml  Impression Exercise Capacity:  Lexiscan with no exercise. BP Response:  Normal blood pressure response. Clinical Symptoms:  No significant symptoms noted. ECG Impression:  No significant ST segment change suggestive of ischemia. Comparison with Prior Nuclear Study: No significant change from previous study  Overall Impression:  Intermediate risk stress nuclear study with a large infarct in the basal and mid inferior and inferolateral walls with mild periinfarct ischemia in the apical inferior wall (SDS 2)..  LV Ejection Fraction: 52%.  LV Wall Motion:  akinesis of the basal and mid inferior and inferolateral walls.   Dorothy Spark 10/03/2013

## 2013-10-08 ENCOUNTER — Telehealth: Payer: Self-pay | Admitting: Cardiovascular Disease

## 2013-10-08 NOTE — Telephone Encounter (Signed)
Spoke with Larry Stone and reviewed lexiscan results with him. He is feeling fine but would like to discuss results with Dr. Angelena Form. Appt made for October 16, 2013 at 11:45

## 2013-10-08 NOTE — Telephone Encounter (Signed)
New message ° ° °Patient returning call back to nurse.  °

## 2013-10-11 ENCOUNTER — Ambulatory Visit: Payer: 59 | Admitting: Internal Medicine

## 2013-10-16 ENCOUNTER — Encounter: Payer: Self-pay | Admitting: Cardiovascular Disease

## 2013-10-16 ENCOUNTER — Ambulatory Visit (INDEPENDENT_AMBULATORY_CARE_PROVIDER_SITE_OTHER): Payer: 59 | Admitting: Cardiovascular Disease

## 2013-10-16 VITALS — BP 120/70 | HR 62 | Ht 70.0 in | Wt 224.0 lb

## 2013-10-16 DIAGNOSIS — I10 Essential (primary) hypertension: Secondary | ICD-10-CM

## 2013-10-16 DIAGNOSIS — I2510 Atherosclerotic heart disease of native coronary artery without angina pectoris: Secondary | ICD-10-CM

## 2013-10-16 NOTE — Patient Instructions (Signed)
Your physician wants you to follow-up in:  6 months. You will receive a reminder letter in the mail two months in advance. If you don't receive a letter, please call our office to schedule the follow-up appointment.   

## 2013-10-16 NOTE — Progress Notes (Signed)
History of Present Illness: 63 yo male with history of CAD, HLD, HTN, OSA here today for cardiac follow up. He has been followed in the past by Dr. Haroldine Laws. 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 He also has residual 50% left main stenosis and nonobstructive disease elsewhere. Cardiac CT in 05/2009 with LM < 50% LAD 50% LCX dominant nonobs RCA small no critical lesions. Small lung nodule. F/u CT showed stable pulm nodule. Carotid u/s 11/11 with minimal carotid plaque. Exercise treadmill stress test 08/30/13 but did not achieve target heart rate. Lexiscan stress myoview 10/03/13 with inferolateral scar, no ischemia, LVEF=52%.   He is here today for follow up. He is doing well. He has not been exercising every day. No CP or SOB.   Primary Care Physician: Teressa Lower  Last Lipid Profile:Lipid Panel     Component Value Date/Time   CHOL 133 08/16/2013 1015   TRIG 50.0 08/16/2013 1015   HDL 42.70 08/16/2013 1015   CHOLHDL 3 08/16/2013 1015   VLDL 10.0 08/16/2013 1015   LDLCALC 80 08/16/2013 1015     Past Medical History  Diagnosis Date  . CAD     --8/09: ETT normal --acute inferior lateral wall infarction in September 2004 treated medically.  --cardiac CT 2006  residual 50% left main stenosis and nonobstructive disease elsewhere. EF normal   --cardiac CT 05/2009. LM. <50% LAD. 50%  . HTN (hypertension)   . Hyperlipidemia   . Pharyngitis   . URI (upper respiratory infection)   . OSA (obstructive sleep apnea)   . Recurrent aspiration bronchitis/pneumonia   . Diabetes mellitus   . GERD (gastroesophageal reflux disease)   . Diverticulosis   . Colonic polyp   . DJD (degenerative joint disease)   . Low back pain syndrome   . Anxiety   . Myocardial infarction 2004    Past Surgical History  Procedure Laterality Date  . Uvulopalatopharyngoplasty      surgery for OSA  . Cardiac catheterization      Current Outpatient Prescriptions  Medication Sig  Dispense Refill  . ALPRAZolam (XANAX) 0.5 MG tablet TAKE 1 TABLET BY MOUTH 3 TIMES A DAY AS NEEDED  90 tablet  1  . amLODipine (NORVASC) 5 MG tablet TAKE 1 TABLET BY MOUTH DAILY  90 tablet  1  . ammonium lactate (LAC-HYDRIN) 12 % lotion Apply topically as directed.  400 g  3  . atorvastatin (LIPITOR) 40 MG tablet TAKE 1 TABLET BY MOUTH EVERY DAY  90 tablet  1  . azithromycin (ZITHROMAX) 250 MG tablet Take as directed  6 each  0  . b complex vitamins tablet Take 1 tablet by mouth daily.      Marland Kitchen buPROPion (WELLBUTRIN SR) 150 MG 12 hr tablet TAKE 1 TABLET BY MOUTH TWICE A DAY  60 tablet  11  . carvedilol (COREG) 12.5 MG tablet TAKE 1 TABLET BY MOUTH TWICE A DAY WITH A MEAL  180 tablet  1  . cetirizine (ZYRTEC) 10 MG tablet Take 1 tablet (10 mg total) by mouth daily.  30 tablet  11  . Cholecalciferol (VITAMIN D3) 5000 UNITS CAPS Take one tablet by mouth daily      . clopidogrel (PLAVIX) 75 MG tablet TAKE 1 TABLET BY MOUTH DAILY  90 tablet  1  . clotrimazole-betamethasone (LOTRISONE) cream APPLY AS DIRECTED  45 g  11  . Coenzyme Q10 (COQ10) 100 MG CAPS Take as directed  30  each  11  . cyanocobalamin 2000 MCG tablet Take 1 tablet (2,000 mcg total) by mouth daily.  30 tablet  11  . furosemide (LASIX) 40 MG tablet TAKE 1 TABLET BY MOUTH DAILY  90 tablet  1  . methylPREDNIsolone (MEDROL DOSPACK) 4 MG tablet       . nitroGLYCERIN (NITROSTAT) 0.4 MG SL tablet Place 1 tablet (0.4 mg total) under the tongue every 5 (five) minutes as needed. For up to 3 doses.  25 tablet  6  . predniSONE (DELTASONE) 10 MG tablet Take 4 tabs for 2 days, then 3 tabs for 2 days, 2 tabs for 2 days, then 1 tab for 2 days, then stop.  20 tablet  0  . sildenafil (VIAGRA) 100 MG tablet Take 1 tablet (100 mg total) by mouth as needed for erectile dysfunction.  15 tablet  3  . Simethicone (GAS-X EXTRA STRENGTH) 125 MG CAPS Prn       No current facility-administered medications for this visit.    Allergies  Allergen Reactions  .  Aspirin     REACTION: allergic to ASA w/ hives...  . Codeine   . Irbesartan     REACTION: allergic to ARBs w/ angioedema  . Ramipril     REACTION: Allergic to ACE's w/ angioedema...    History   Social History  . Marital Status: Married    Spouse Name: N/A    Number of Children: 1  . Years of Education: N/A   Occupational History  . [employer] History Main Topics  . Smoking status: Former Smoker -- 1.00 packs/day for 20 years    Types: Cigarettes, Cigars    Quit date: 05/08/1998  . Smokeless tobacco: Never Used     Comment: quit in 2005  . Alcohol Use: 0.5 oz/week    1 drink(s) per week     Comment: social drinker  . Drug Use: No  . Sexual Activity: Yes   Other Topics Concern  . Not on file   Social History Narrative  . No narrative on file    Family History  Problem Relation Age of Onset  . Colon cancer Father   . Stroke Mother   . Heart disease Mother   . Hypertension Sister   . Diabetes Sister     Review of Systems:  As stated in the HPI and otherwise negative.   BP 120/70  Pulse 62  Ht 5\' 10"  (1.778 m)  Wt 224 lb (101.606 kg)  BMI 32.14 kg/m2  Physical Examination: General: Well developed, well nourished, NAD HEENT: OP clear, mucus membranes moist SKIN: warm, dry. No rashes. Neuro: No focal deficits Musculoskeletal: Muscle strength 5/5 all ext Psychiatric: Mood and affect normal Neck: No JVD, no carotid bruits, no thyromegaly, no lymphadenopathy. Lungs:Clear bilaterally, no wheezes, rhonci, crackles Cardiovascular: Regular rate and rhythm. No murmurs, gallops or rubs. Abdomen:Soft. Bowel sounds present. Non-tender.  Extremities: No lower extremity edema. Pulses are 2 + in the bilateral DP/PT.  Echo 08/30/13: Left ventricle: The cavity size was normal. Systolic function was normal. The estimated ejection fraction was in the range of 55% to 60%. Probable mild hypokinesis of the basal-midinferolateral and  inferior myocardium. Doppler parameters are consistent with abnormal left ventricular relaxation (grade 1 diastolic dysfunction). - Mitral valve: Calcified annulus.  Stress myoview 10/03/13: Stress Procedure: The patient received IV Lexiscan 0.4 mg over 15-seconds. Technetium 49m Sestamibi injected at 30-seconds. This patient had sob and an "Uncomfortable Fullness"  with the Lexiscan injection. Quantitative spect images were obtained after a 45 minute delay.  Stress ECG: No significant change from baseline ECG  QPS  Raw Data Images: Normal; no motion artifact; normal heart/lung ratio.  Stress Images: There is a large size severe severity perfusion defect in the basal and mid inferior and inferolateral walls.  Rest Images: There is a large size severe severity perfusion defect in the basal and mid inferior and inferolateral walls.  Subtraction (SDS): SDS 2  Transient Ischemic Dilatation (Normal <1.22): 1.06  Lung/Heart Ratio (Normal <0.45): 0.32  Quantitative Gated Spect Images  QGS EDV: 135 ml  QGS ESV: 65 ml  Impression  Exercise Capacity: Lexiscan with no exercise.  BP Response: Normal blood pressure response.  Clinical Symptoms: No significant symptoms noted.  ECG Impression: No significant ST segment change suggestive of ischemia.  Comparison with Prior Nuclear Study: No significant change from previous study  Overall Impression: Intermediate risk stress nuclear study with a large infarct in the basal and mid inferior and inferolateral walls with mild periinfarct ischemia in the apical inferior wall (SDS 2)..  LV Ejection Fraction: 52%. LV Wall Motion: akinesis of the basal and mid inferior and inferolateral walls.   Assessment and Plan:  1. CAD: Stable. Stress test shows inferolateral scar to be expected after his cardiac event in 2004 with occluded OM branch. No other ischemia. LV function normal.  Continue current regimen.   2. HYPERTENSION:  Blood pressure well controlled.  Continue current regimen.   3. HYPERLIPIDEMIA: LDL at goal. Continue current regimen.

## 2013-12-05 ENCOUNTER — Telehealth: Payer: Self-pay | Admitting: Pulmonary Disease

## 2013-12-05 MED ORDER — AZITHROMYCIN 250 MG PO TABS: ORAL_TABLET | ORAL | Status: DC

## 2013-12-05 NOTE — Telephone Encounter (Signed)
Per SN--  Ok for zpak #1  Take as directed

## 2013-12-05 NOTE — Telephone Encounter (Signed)
Patient states he started sneezing and having watery eyes on Sunday; then Monday morning had watery eyes and runny nose, for past 2 days he has been tired and chest feels raw-like bronchitis; wheezing today as well. Pt is set to go out of town this afternoon for about 2 weeks; Pt would like to have Zpak sent to pharmacy.  Pt was seen 07/10/13 by SN -ROV in 6 months Pt was seen 09/11/13 by [facility]ov Zpak and Pred pak sent on 09/14/13 by SN Zpak and Pred taper sent on 09/24/13 by TP for SN-told if no better will need OV.    Please advise if okay to send Zpak or have patient come in for acute visit.

## 2013-12-05 NOTE — Telephone Encounter (Signed)
Pt is aware of Rx refill okayed by SN and sent to [facility] [place].

## 2013-12-06 ENCOUNTER — Ambulatory Visit: Payer: 59 | Admitting: Cardiovascular Disease

## 2013-12-13 ENCOUNTER — Ambulatory Visit (INDEPENDENT_AMBULATORY_CARE_PROVIDER_SITE_OTHER): Payer: 59 | Admitting: Pulmonary Disease

## 2013-12-13 ENCOUNTER — Encounter: Payer: Self-pay | Admitting: Pulmonary Disease

## 2013-12-13 ENCOUNTER — Ambulatory Visit (INDEPENDENT_AMBULATORY_CARE_PROVIDER_SITE_OTHER)
Admission: RE | Admit: 2013-12-13 | Discharge: 2013-12-13 | Disposition: A | Discharge status: Home / Self Care | Payer: 59 | Source: Ambulatory Visit | Attending: Pulmonary Disease | Admitting: Pulmonary Disease

## 2013-12-13 ENCOUNTER — Ambulatory Visit: Payer: 59

## 2013-12-13 VITALS — BP 130/80 | HR 62 | Temp 97.4°F | Ht 70.0 in | Wt 220.8 lb

## 2013-12-13 DIAGNOSIS — I10 Essential (primary) hypertension: Secondary | ICD-10-CM

## 2013-12-13 DIAGNOSIS — J984 Other disorders of lung: Secondary | ICD-10-CM

## 2013-12-13 DIAGNOSIS — J449 Chronic obstructive pulmonary disease, unspecified: Secondary | ICD-10-CM

## 2013-12-13 DIAGNOSIS — J4489 Other specified chronic obstructive pulmonary disease: Secondary | ICD-10-CM | POA: Insufficient documentation

## 2013-12-13 DIAGNOSIS — G4733 Obstructive sleep apnea (adult) (pediatric): Secondary | ICD-10-CM

## 2013-12-13 DIAGNOSIS — J42 Unspecified chronic bronchitis: Secondary | ICD-10-CM

## 2013-12-13 LAB — IGE: IgE (Immunoglobulin E), Serum: 1362 IU/mL — ABNORMAL HIGH (ref 0.0–180.0)

## 2013-12-13 MED ORDER — MOMETASONE FURO-FORMOTEROL FUM 100-5 MCG/ACT IN AERO: 2.0000 | INHALATION_SPRAY | Freq: Two times a day (BID) | RESPIRATORY_TRACT | Status: DC

## 2013-12-13 MED ORDER — BUPROPION HCL ER (SR) 150 MG PO TB12: ORAL_TABLET | ORAL | Status: DC

## 2013-12-13 NOTE — Progress Notes (Addendum)
Subjective:    Patient ID: Larry Stone, male    DOB: 23-Nov-1950, 63 y.o.   MRN: [id number]  HPI 63 y/o WM here for a followup visit... he has multiple medical problems as noted below...  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 ~  SEE PREV EPIC NOTES FOR OLDER DATA >>   ~  May 15, 2012:  32mo ROV & Larry Stone has had a good 10mo he says- no new complaints or concerns; he has an impressive list of meds...     OSA> uses Transderm scope prn dizziness, uses "breathe right nasal strips" periodically; resting OK, no daytime hypersomnolence...    HxBronchitis> on Zyrtek, Tussionex; doing satis w/o cough, phlegm, etc...    HBP> on Coreg 12.5Bid, Amlod5, Lasix40 prn, K20/d; BP= 120/80 & he denies interval CP, palpit, SOB, edema...    CAD, RBBB> on Plavix75; he saw DrMcAlhany 11/13> CAD w/ AMI 2004 & residual 50% Lmain; stable, exercising regularly, no angina & no changes made...    Hyperlipid> on Lip40, Niasp500, FishOil, KrillOil, CoQ10; FLP shows TChol 114, TG 31, HDL 45, LDL 63    DM> on diet alone; wt= 214# & FBS= 112...    GI- GERD, Divetrtics, Polyps> on Simethacone for gas; father died of colon cancer age 66; last colonoscopy 10/07 by Deatra Ina & showed divertics only- last polyp removed 2004- he is overdue.    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    DJD, LBP> on Osteobiflex, MVI, VitD5000; stable & exercising regularly...    Memory Loss, Anxiety> on Wellbutrin & Alpraz...    ?Hx Vit B12 defic> on B12 2070mcg po daily (dosed on his own); B12 level = 1005 & ok to decr B12 supplement to 1032mcg/d...    Anxiety> on WellbutrinSR 150Bid, Xanax 0,5mg  prn;  We reviewed prob list, meds, xrays and labs> see below for updates >> he had the 2013 Flu vaccine 11/13  CXR 1/14 showed  normal heart size, clear lungs, chr right rib deformity (old fx), NAD...  LABS 1/14:  FLP- at goals on Lip40+Niasp;  Chems- wnl;  CBC- wnl;  TSH=1.93;  PSA=0.39;  Testos=307 (350-890) on Testim 2tubes/d;  B12=1005 (211-911) on B12 2078mcg/d...  ~  November 14, 2012:  33mo ROV & Larry Stone has had a good interval w/o new complaints or concerns;  He has gained 9# up to 223# today & we reviewed diet, exercise , & wt reduction strategies...    He remains on Coreg & Amlodipine, uses Lasix just as needed & rarely needs it); BP= 116/74 & he remains asymptomatic...    Followed by [facility] for Cards on Plavix> seen yearly in the fall & stable w/o CP, palpit, etc...    He remaiins on Lip40 along w/ CoQ10, krill oil, Fish oil> FLP 1/14 was great, continue same rx...    He had f/u colonoscopy 5/14 by DrKaplan (done w/ pt on Plavix rx)> mild divertics & 52mm polyp in desc colon removed (tubular adenoma) & repeat rec in 5 yrs...     He continues on Testim using 1 tube daily; +Viagra/Cialis for ED; he notes energy good, feeling well, no issues reported... We reviewed prob list, meds, xrays and labs> see below for updates >> OK TDAP today...  ~  July 10, 2013:  77mo ROV &  Larry Stone has been stable, notes occas HAs but BP has been OK at home, and he is under some stress...     OSA> uses Transderm scope prn dizziness, uses "breathe right nasal strips" periodically; resting OK, no daytime hypersomnolence...    HxBronchitis> on Zyrtek prn; doing satis w/o cough, phlegm, etc...    HBP> on Coreg 12.5Bid, Amlod5, Lasix40 prn; BP= 124/80 & he denies interval CP, palpit, SOB, edema...    CAD, RBBB> on Plavix75; he last saw DrMcAlhany 11/13> CAD w/ AMI 2004 & residual 50% Lmain; stable, exercising regularly, no angina & no changes made; due for Cards f/u appt...    Hyperlipid> on Lip40, CoQ10; FLP 4/15 shows TChol 133, TG 50, HDL 43, LDL 80    DM> on diet alone; wt= 223# & BMI=32; Labs 4/15 showed FBS= 115, A1c= 6.0..Marland Kitchen    GI- GERD,  Divetrtics, Polyps> on Simethacone for gas; father died of colon cancer age 61; last colonoscopy 5/14 by Deatra Ina & showed divertics & 12mm polyp= tub adenoma, repeat 74yrs.    GU- Hypogonad> prev on Testim 2tubes daily, +Viagra&Cialis as needed; labs 4/15 showed PSA=0.39 and he is feeling well, good energy/ libido/ etc...    DJD, LBP> on Osteobiflex, MVI, VitD5000; stable & exercising regularly...    Memory Loss, Anxiety> on Wellbutrin & Alpraz...    ?Hx Vit B12 defic> on B12 2058mcg po daily (dosed on his own); B12 level = 1005 & ok to decr B12 supplement...    Anxiety> on WellbutrinSR 150Bid, Xanax 0,5mg  prn... We reviewed prob list, meds, xrays and labs> see below for updates >> he had the 2014 flu vaccine 11/14...  LABS 4/15:  FLP- at goals on Lip40;  Chems- ok w/ BS=115, A1c=6.0, Cr=1.2;  CBC- wnl;  TSH=1.55;  PSA=0.39...   ~  December 13, 2013:  75mo ROV & Larry Stone has established w/ DrPlotnikov for Primary Care, seen 5/15 & note reviewed...  He requested Pulm f/u visit due to concern over several recurrent bronchitic infections over the last few months- treated w/ ZPaks and improved but he & wife are concerned as to why he is getting these; he uses OTC Zyrtek10mg /d, he is an ex-smoker (1ppd for 52yrs, quit 2004), hx tiny 52mm nodule in RML on prior CT Chest along w/ mild centrilob emphysema & no changes serially- last CXR 1/14 w/ norm heart size, clear lungs x mild bronchitic change, and chr right posterolat rib deformity... He is c/o several bouts of bronchitis w/ cough, beige sput, denies f/c/s, but feels tight/ congested/ occas wheezing & SOB, no CP;  I cannot find prev PFTs and we discussed f/u CXR, PFT, and Labs to check for the unlikely presence of an immune defic syndrome... We reviewed prob list, meds, xrays and labs> see below for updates >>   CXR 8/15 showed normal heart size, clear lungs w/o infiltrates, old healed right rib fxs, NAD...   PFT 8/15 showed FVC=2.32 (50%), FEV1=1.63 (45%),  %1sec=70, mid-flows=33% predicted; suggestive of GOLD Stage 3 COPD but can't r/o superimposed restriction w/o lung volume measurement...  LABS 8/15:  SPE/IEP w/o monoclonal gammopathy; Quant Ig's showed normal IgG, normal IgA, low IgM at 26 (40-250), and a hi IgE at 1362 => we will follow w/ RAST panel & consider Xolair Rx...  RAST Panel 9/15 => pending PLAN>>  We discussed starting regular dosing of an inhaled ICS/LABA combination=> try DULERA100- 2spBid; ROV in 3 mo & we will plan Full PFTs at that time...  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            Problem List:     ALLERGIC to ASA w/ hives, & ACE inhibitors/ ARBs too w/ angioedema.  OBSTRUCTIVE SLEEP APNEA (ICD-327.23) - s/p UPPP surgery in 2004 by DrRedman...  states he's doing OK, denies snoring, no daytime hypersom, wife not complaining... uses ZYRTEK for allergy symtoms. ~  1/13: He has started using the "breathe right nasal strips" qhs for recurrent snoring; still says he rests well, denies daytime hypersomnolence, & declines offer for repeat sleep study to check... ~  7/14:  He reports resting well, no daytime symptoms or sleep issues...   Hx of BRONCHITIS, RECURRENT (ICD-491.9) - he is an ex-smoker, prev 1 ppd for 14yrs, quit in 2004. COPD, mixed type- w/ mild centrilobular emphysema on CT Chest 10/11, & chronic bronchitis clinically w/ recurrent infections... ~  Hx tiny RML nodule seen on Cardiac CT 2/11 & apparently unchanged from old [place] 2004... ~  10/11: f/u CT Chest for f/u tiny 75mm nodule RML- unchanged xyrs & benign, mild centilob emphysema, +coronary calcif, NAD... ~  1/13: he denies breathing problems, intercurrent infections, etc... ~  1/14:  CXR 1/14 showed normal heart size, clear lungs, chr right rib deformity (old  fx), NAD.  ~  8/15: he was concerned about several recent bouts of bronchitis requiring ZPak for Rx> we checked labs and Immunoglobulins =>   Decided to start inhaler w/ DULERA100-2spBid...  ~  CXR 8/15 showed normal heart size, clear lungs w/o infiltrates, old healed right rib fxs, NAD. ~  PFT 8/15 showed FVC=2.32 (50%), FEV1=1.63 (45%), %1sec=70, mid-flows=33% predicted; suggestive of GOLD Stage 3 COPD but can't r/o superimposed restriction w/o lung volume measurement.    MEDICAL PROBLEMS PER DrPlotnikov >>   HYPERTENSION (ICD-401.9) - on COREG 12.5mg Bid, AMLODIPINE 5mg /d, & LASIX 40mg - only as needed (& he hasn't needed any); BP= 130/80 & he denies CP, palpit, ch in SOB, edema...  ARTERIOSCLEROTIC HEART DISEASE (ICD-414.00) - on PLAVIX 75mg /d, allergic to ASA... followed by [facility] and seen 6/15 (note reviewed)... 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 ~  f/u Cardiac CT 2/11 showed>  Calcified plaque in the left main with < 50% stenosis.  Calcified plaque in the proximal and mid LAD with at most moderate (around 50%) stenosis in the mid LAD. ~  Vasc Screen per Cards 10/11 showed mild carotid plaques, norm AbdAo & ABIs...  HYPERLIPIDEMIA (ICD-272.4) - on LIPITOR 40mg /d, & CoQ10... Last FLP 4/15 on showed TChol 133, TG 50, HDL 43, LDL 80  DIABETES MELLITUS, BORDERLINE (ICD-790.29) - on diet alone... Labs 4/15- weight= 223#, BS=115, A1c=6.0  GERD (ICD-530.81) - he uses H2 blockers as needed.  DIVERTICULOSIS OF COLON (ICD-562.10) & COLONIC POLYPS (ICD-211.3) - his last polyps were ~88mm and removed in 2004= adenomatous... there is a +fam hx of colon cancer in his father who died at age 70... ~  Last colonoscopy 5/14 by DrKaplan (done w/ pt on Plavix rx)> mild divertics & 30mm polyp in desc colon removed (tubular adenoma) & repeat rec in 5 yrs.   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 ~  3/15: he is  off  the prev Testos replacement Rx> states he feels well, good energy, PSA=0.39  DEGENERATIVE JOINT DISEASE (ICD-715.90) - he reports doing fair- mostly c/o knees & hands ("I messed up my knee in a fall")... he saw Ortho ?who? on Glucosamine/ Chondroitin supplements.  LOW BACK PAIN SYNDROME (ICD-724.2)  MEMORY LOSS >>  ~  1/13: he states that his "memory is fuzzy" & "Clarity is missing"; occas he will swear that he said something but he really didn't; he's had prev scans etc & I have recommended a Neurology eval for his memory but he declines at this time "I'll think about it" he said...  ANXIETY (ICD-300.00) - on WELLBUTRIN 150mg Bid, & ALPRAZOLAM 0.5mg  Prn... increased stress w/ mother's stroke last year... he's the main care giver w/ 2 sisters who Larry Stone't help much...   Health Maintenance: ~  Immuniztions:  he is encouraged to get the yearly Flu vaccine... given PNEUMOVAX 2/12... He reports a lot of travel & he's been to the travel clinic & had "every vaccine known to man"...   Past Surgical History  Procedure Laterality Date  . Uvulopalatopharyngoplasty      surgery for OSA  . Cardiac catheterization      Outpatient Encounter Prescriptions as of 12/13/2013  Medication Sig  . ALPRAZolam (XANAX) 0.5 MG tablet TAKE 1 TABLET BY MOUTH 3 TIMES A DAY AS NEEDED  . amLODipine (NORVASC) 5 MG tablet TAKE 1 TABLET BY MOUTH DAILY  . ammonium lactate (LAC-HYDRIN) 12 % lotion Apply topically as directed.  Marland Kitchen atorvastatin (LIPITOR) 40 MG tablet TAKE 1 TABLET BY MOUTH EVERY DAY  . b complex vitamins tablet Take 1 tablet by mouth daily.  Marland Kitchen buPROPion (WELLBUTRIN SR) 150 MG 12 hr tablet TAKE 1 TABLET BY MOUTH TWICE A DAY  . carvedilol (COREG) 12.5 MG tablet TAKE 1 TABLET BY MOUTH TWICE A DAY WITH A MEAL  . cetirizine (ZYRTEC) 10 MG tablet Take 1 tablet (10 mg total) by mouth daily.  . Cholecalciferol (VITAMIN D3) 5000 UNITS CAPS Take one tablet by mouth daily  . clopidogrel (PLAVIX) 75 MG tablet TAKE 1  TABLET BY MOUTH DAILY  . clotrimazole-betamethasone (LOTRISONE) cream APPLY AS DIRECTED  . Coenzyme Q10 (COQ10) 100 MG CAPS Take as directed  . cyanocobalamin 2000 MCG tablet Take 1 tablet (2,000 mcg total) by mouth daily.  . furosemide (LASIX) 40 MG tablet TAKE 1 TABLET BY MOUTH DAILY  . nitroGLYCERIN (NITROSTAT) 0.4 MG SL tablet Place 1 tablet (0.4 mg total) under the tongue every 5 (five) minutes as needed. For up to 3 doses.  . sildenafil (VIAGRA) 100 MG tablet Take 1 tablet (100 mg total) by mouth as needed for erectile dysfunction.  . Simethicone (GAS-X EXTRA STRENGTH) 125 MG CAPS Prn  . mometasone-formoterol (DULERA) 100-5 MCG/ACT AERO Inhale 2 puffs into the lungs 2 (two) times daily.    Allergies  Allergen Reactions  . Aspirin     REACTION: allergic to ASA w/ hives...  . Codeine   . Irbesartan     REACTION: allergic to ARBs w/ angioedema  . Ramipril     REACTION: Allergic to ACE's w/ angioedema...    Current Medications, Allergies, Past Medical History, Past Surgical History, Family History, and Social History were reviewed in [employer] record.    Review of Systems       See HPI - all other systems neg except as noted...       The patient complains of dyspnea on exertion and muscle weakness.  The patient denies anorexia, fever, weight loss, weight gain, vision loss, decreased hearing, hoarseness, chest pain, syncope, peripheral edema, prolonged cough, headaches, hemoptysis, abdominal pain, melena, hematochezia, severe indigestion/heartburn, hematuria, incontinence, suspicious skin lesions, transient blindness, difficulty walking, depression, unusual weight change, abnormal bleeding, enlarged lymph nodes, and angioedema.     Objective:   Physical Exam    WD, sl overweight, 63 y/o WM in NAD... GENERAL:  Alert & oriented; pleasant & cooperative... HEENT:  [place]/AT, EOM-wnl, PERRLA, EACs-clear, TMs-wnl, NOSE-clear, THROAT-s/p UPPP surg... NECK:  Supple  w/ fairROM; no JVD; normal carotid impulses w/o bruits; no thyromegaly or nodules palpated; no lymphadenopathy. CHEST:  Clear to P & A; without wheezes/ rales/ or rhonchi. HEART:  Regular Rhythm; without murmurs/ rubs/ or gallops. ABDOMEN:  Soft & nontender; normal bowel sounds; no organomegaly or masses detected. EXT: without deformities, mild arthritic changes; no varicose veins/ venous insuffic/ or edema. Neuro:  intact w/o focal abn detected... DERM:  No lesions noted; no rash etc...  RADIOLOGY DATA:  Reviewed in the EPIC EMR & discussed w/ the patient...   LABORATORY DATA:  Reviewed in the EPIC EMR & discussed w/ the patient...     Assessment & Plan:    Hx OSA, s/p UPPP in 2004>  noted recurrent snoring & using breathe right nasal strips; he doesn't want repeat sleep study or further eval, just want Rx for the strips so he can pay for them w/ his flex acct...  Hx recurrent bronchitis, underlying COPD (mixed type)>  He quit smoking in 2004 & now w/ recent bronchitic exacerbations=> PFT shows mod obstructive dis & we will try DULERA100- 2spBid w/ recheck & full PFTs in 64mo...  Medical issues per DrPlotnikov>  HBP, CAD s/pMI, VI/ edema, HL, DM, GERD/ Divertics/ Polyps, GU/ Low-T, DJD/ LBP, Anxiety...    Patient's Medications  New Prescriptions   MOMETASONE-FORMOTEROL (DULERA) 100-5 MCG/ACT AERO    Inhale 2 puffs into the lungs 2 (two) times daily.  Previous Medications   ALPRAZOLAM (XANAX) 0.5 MG TABLET    TAKE 1 TABLET BY MOUTH 3 TIMES A DAY AS NEEDED   AMLODIPINE (NORVASC) 5 MG TABLET    TAKE 1 TABLET BY MOUTH DAILY   AMMONIUM LACTATE (LAC-HYDRIN) 12 % LOTION    Apply topically as directed.   ATORVASTATIN (LIPITOR) 40 MG TABLET    TAKE 1 TABLET BY MOUTH EVERY DAY   B COMPLEX VITAMINS TABLET    Take 1 tablet by mouth daily.   CARVEDILOL (COREG) 12.5 MG TABLET    TAKE 1 TABLET BY MOUTH TWICE A DAY WITH A MEAL   CETIRIZINE (ZYRTEC) 10 MG TABLET    Take 1 tablet (10 mg total) by  mouth daily.   CHOLECALCIFEROL (VITAMIN D3) 5000 UNITS CAPS    Take one tablet by mouth daily   CLOPIDOGREL (PLAVIX) 75 MG TABLET    TAKE 1 TABLET BY MOUTH DAILY   CLOTRIMAZOLE-BETAMETHASONE (LOTRISONE) CREAM    APPLY AS DIRECTED   COENZYME Q10 (COQ10) 100 MG CAPS    Take as directed   CYANOCOBALAMIN 2000 MCG TABLET    Take 1 tablet (2,000 mcg total) by mouth daily.   FUROSEMIDE (LASIX) 40 MG TABLET    TAKE 1 TABLET BY MOUTH DAILY   NITROGLYCERIN (NITROSTAT) 0.4 MG SL TABLET    Place 1 tablet (0.4 mg total) under the tongue every 5 (five) minutes as needed. For up to 3 doses.   SILDENAFIL (VIAGRA) 100 MG TABLET    Take 1  tablet (100 mg total) by mouth as needed for erectile dysfunction.   SIMETHICONE (GAS-X EXTRA STRENGTH) 125 MG CAPS    Prn  Modified Medications   Modified Medication Previous Medication   BUPROPION (WELLBUTRIN SR) 150 MG 12 HR TABLET buPROPion (WELLBUTRIN SR) 150 MG 12 hr tablet      TAKE 1 TABLET BY MOUTH TWICE A DAY    TAKE 1 TABLET BY MOUTH TWICE A DAY  Discontinued Medications   AZITHROMYCIN (ZITHROMAX) 250 MG TABLET    Take as directed   METHYLPREDNISOLONE (MEDROL DOSPACK) 4 MG TABLET       PREDNISONE (DELTASONE) 10 MG TABLET    Take 4 tabs for 2 days, then 3 tabs for 2 days, 2 tabs for 2 days, then 1 tab for 2 days, then stop.

## 2013-12-13 NOTE — Patient Instructions (Signed)
Today we updated your med list in our EPIC system...    Continue your current medications the same...  Today we did a follow up CXR, Pulm Function Test & Lab work to check your immunoglobulins...    We will contact you w/ the results when available...   In the interim we are going to start DULERA inhaler- 2 sprays twice daily on a regular basis...  Let me know if you want to pursue additional Sleep Testing for further eval of your sleep issues...  Call for any questions...  Let's plan a follow up visit in 3 months to check your progress & re-eval your pulmonary function.Marland KitchenMarland Kitchen

## 2013-12-14 LAB — IGG, IGA, IGM
IgA: 160 mg/dL (ref 68–379)
IgG (Immunoglobin G), Serum: 989 mg/dL (ref 650–1600)
IgM, Serum: 26 mg/dL — ABNORMAL LOW (ref 41–251)

## 2013-12-17 LAB — IFE INTERPRETATION

## 2013-12-17 LAB — PROTEIN ELECTROPHORESIS, SERUM, WITH REFLEX
Albumin ELP: 57 % (ref 55.8–66.1)
Alpha-1-Globulin: 6 % — ABNORMAL HIGH (ref 2.9–4.9)
Alpha-2-Globulin: 11.9 % — ABNORMAL HIGH (ref 7.1–11.8)
Beta 2: 5.2 % (ref 3.2–6.5)
Beta Globulin: 7 % (ref 4.7–7.2)
Gamma Globulin: 12.9 % (ref 11.1–18.8)
Total Protein, Serum Electrophoresis: 7.2 g/dL (ref 6.0–8.3)

## 2013-12-18 ENCOUNTER — Other Ambulatory Visit: Payer: Self-pay | Admitting: Pulmonary Disease

## 2013-12-18 DIAGNOSIS — J42 Unspecified chronic bronchitis: Secondary | ICD-10-CM

## 2013-12-18 NOTE — Progress Notes (Signed)
Per SN: please put in RAST test

## 2014-01-09 ENCOUNTER — Ambulatory Visit: Payer: 59 | Admitting: Pulmonary Disease

## 2014-02-15 ENCOUNTER — Other Ambulatory Visit: Payer: Self-pay | Admitting: Cardiovascular Disease

## 2014-03-05 ENCOUNTER — Encounter: Payer: Self-pay | Admitting: Internal Medicine

## 2014-03-05 ENCOUNTER — Other Ambulatory Visit (INDEPENDENT_AMBULATORY_CARE_PROVIDER_SITE_OTHER): Payer: 59

## 2014-03-05 ENCOUNTER — Ambulatory Visit (INDEPENDENT_AMBULATORY_CARE_PROVIDER_SITE_OTHER): Payer: 59 | Admitting: Internal Medicine

## 2014-03-05 VITALS — BP 130/84 | HR 67 | Temp 97.8°F | Ht 68.5 in | Wt 217.0 lb

## 2014-03-05 DIAGNOSIS — Z0000 Encounter for general adult medical examination without abnormal findings: Secondary | ICD-10-CM

## 2014-03-05 DIAGNOSIS — J3089 Other allergic rhinitis: Secondary | ICD-10-CM

## 2014-03-05 DIAGNOSIS — R739 Hyperglycemia, unspecified: Secondary | ICD-10-CM

## 2014-03-05 DIAGNOSIS — Z23 Encounter for immunization: Secondary | ICD-10-CM

## 2014-03-05 DIAGNOSIS — E785 Hyperlipidemia, unspecified: Secondary | ICD-10-CM

## 2014-03-05 LAB — HEPATIC FUNCTION PANEL
ALT: 23 U/L (ref 0–53)
AST: 20 U/L (ref 0–37)
Albumin: 4.2 g/dL (ref 3.5–5.2)
Alkaline Phosphatase: 99 U/L (ref 39–117)
BILIRUBIN DIRECT: 0.1 mg/dL (ref 0.0–0.3)
BILIRUBIN TOTAL: 0.8 mg/dL (ref 0.2–1.2)
Total Protein: 7 g/dL (ref 6.0–8.3)

## 2014-03-05 LAB — BASIC METABOLIC PANEL
BUN: 14 mg/dL (ref 6–23)
CALCIUM: 9.1 mg/dL (ref 8.4–10.5)
CO2: 24 mEq/L (ref 19–32)
CREATININE: 1 mg/dL (ref 0.4–1.5)
Chloride: 105 mEq/L (ref 96–112)
GFR: 76.47 mL/min (ref >60.00)
Glucose, Bld: 105 mg/dL — ABNORMAL HIGH (ref 70–99)
Potassium: 4.7 mEq/L (ref 3.5–5.1)
Sodium: 139 mEq/L (ref 135–145)

## 2014-03-05 LAB — CBC WITH DIFFERENTIAL/PLATELET
BASOS ABS: 0 10*3/uL (ref 0.0–0.1)
BASOS PCT: 0.4 % (ref 0.0–3.0)
Eosinophils Absolute: 0.3 10*3/uL (ref 0.0–0.7)
Eosinophils Relative: 3.7 % (ref 0.0–5.0)
HCT: 44.5 % (ref 39.0–52.0)
Hemoglobin: 15.1 g/dL (ref 13.0–17.0)
LYMPHS PCT: 19.7 % (ref 12.0–46.0)
Lymphs Abs: 1.7 10*3/uL (ref 0.7–4.0)
MCHC: 33.8 g/dL (ref 30.0–36.0)
MCV: 92.4 fl (ref 78.0–100.0)
MONOS PCT: 8.2 % (ref 3.0–12.0)
Monocytes Absolute: 0.7 10*3/uL (ref 0.1–1.0)
NEUTROS PCT: 68 % (ref 43.0–77.0)
Neutro Abs: 5.7 10*3/uL (ref 1.4–7.7)
Platelets: 168 10*3/uL (ref 150.0–400.0)
RBC: 4.81 Mil/uL (ref 4.22–5.81)
RDW: 13.2 % (ref 11.5–15.5)
WBC: 8.4 10*3/uL (ref 4.0–10.5)

## 2014-03-05 LAB — LIPID PANEL
CHOL/HDL RATIO: 3
Cholesterol: 153 mg/dL (ref 0–200)
HDL: 53.1 mg/dL (ref >39.00)
LDL CALC: 92 mg/dL (ref 0–99)
NonHDL: 99.9
Triglycerides: 42 mg/dL (ref 0.0–149.0)
VLDL: 8.4 mg/dL (ref 0.0–40.0)

## 2014-03-05 LAB — URINALYSIS
BILIRUBIN URINE: NEGATIVE
Hgb urine dipstick: NEGATIVE
KETONES UR: NEGATIVE
Leukocytes, UA: NEGATIVE
Nitrite: NEGATIVE
Specific Gravity, Urine: >=1.03 — AB (ref 1.000–1.030)
Total Protein, Urine: NEGATIVE
Urine Glucose: NEGATIVE
Urobilinogen, UA: 0.2 (ref 0.0–1.0)
pH: 5.5 (ref 5.0–8.0)

## 2014-03-05 LAB — TSH: TSH: 1.77 u[IU]/mL (ref 0.35–4.50)

## 2014-03-05 LAB — PSA: PSA: 0.5 ng/mL (ref 0.10–4.00)

## 2014-03-05 LAB — HEMOGLOBIN A1C: Hgb A1c MFr Bld: 6.1 % (ref 4.6–6.5)

## 2014-03-05 NOTE — Patient Instructions (Signed)
  Milk free trial (no milk, ice cream, cheese and yogurt) for 4-6 weeks. OK to use almond, coconut, rice or soy milk. "Almond breeze" brand tastes good.  Paleo blueprint diet

## 2014-03-05 NOTE — Progress Notes (Signed)
Subjective:     HPI  The patient is here for a wellness exam. The patient has been doing well overall without major physical or psychological issues going on lately. C/o allergies...  The patient presents for a follow-up of  chronic hypertension, chronic dyslipidemia, CAD controlled with medicines   BP Readings from Last 3 Encounters:  03/05/14 130/84  12/13/13 130/80  10/16/13 120/70   Wt Readings from Last 3 Encounters:  03/05/14 217 lb (98.431 kg)  12/13/13 220 lb 12.8 oz (100.154 kg)  10/16/13 224 lb (101.606 kg)       Review of Systems  Constitutional: Negative for appetite change, fatigue and unexpected weight change.  HENT: Negative for congestion, nosebleeds, sneezing, sore throat and trouble swallowing.   Eyes: Negative for itching and visual disturbance.  Respiratory: Negative for cough.   Cardiovascular: Negative for chest pain, palpitations and leg swelling.  Gastrointestinal: Negative for nausea, diarrhea, blood in stool and abdominal distention.  Genitourinary: Negative for frequency and hematuria.  Musculoskeletal: Negative for back pain, joint swelling, gait problem and neck pain.  Skin: Negative for rash.  Neurological: Negative for dizziness, tremors, speech difficulty and weakness.  Psychiatric/Behavioral: Negative for sleep disturbance, dysphoric mood and agitation. The patient is not nervous/anxious.        Objective:   Physical Exam  Constitutional: He is oriented to person, place, and time. He appears well-developed and well-nourished. No distress.  HENT:  Head: Normocephalic and atraumatic.  Right Ear: External ear normal.  Left Ear: External ear normal.  Nose: Nose normal.  Mouth/Throat: Oropharynx is clear and moist. No oropharyngeal exudate.  Eyes: Conjunctivae and EOM are normal. Pupils are equal, round, and reactive to light. Right eye exhibits no discharge. Left eye exhibits no discharge. No scleral icterus.  Neck: Normal range of  motion. Neck supple. No JVD present. No tracheal deviation present. No thyromegaly present.  Cardiovascular: Normal rate, regular rhythm, normal heart sounds and intact distal pulses.  Exam reveals no gallop and no friction rub.   No murmur heard. Pulmonary/Chest: Effort normal and breath sounds normal. No stridor. No respiratory distress. He has no wheezes. He has no rales. He exhibits no tenderness.  Abdominal: Soft. Bowel sounds are normal. He exhibits no distension and no mass. There is no tenderness. There is no rebound and no guarding.  Genitourinary: Penis normal.  Declined rectal  Musculoskeletal: Normal range of motion. He exhibits no edema or tenderness.  Lymphadenopathy:    He has no cervical adenopathy.  Neurological: He is alert and oriented to person, place, and time. He has normal reflexes. No cranial nerve deficit. He exhibits normal muscle tone. Coordination normal.  Skin: Skin is warm and dry. No rash noted. He is not diaphoretic. No erythema. No pallor.  Psychiatric: He has a normal mood and affect. His behavior is normal. Judgment and thought content normal.    Lab Results  Component Value Date   WBC 7.3 08/16/2013   HGB 14.5 08/16/2013   HCT 42.4 08/16/2013   PLT 152.0 08/16/2013   GLUCOSE 115* 08/16/2013   CHOL 133 08/16/2013   TRIG 50.0 08/16/2013   HDL 42.70 08/16/2013   LDLCALC 80 08/16/2013   ALT 27 08/16/2013   AST 19 08/16/2013   NA 139 08/16/2013   K 5.0 08/16/2013   CL 103 08/16/2013   CREATININE 1.2 08/16/2013   BUN 18 08/16/2013   CO2 29 08/16/2013   TSH 1.55 08/16/2013   PSA 0.39 08/16/2013   HGBA1C 6.0  08/16/2013         Assessment & Plan:

## 2014-03-05 NOTE — Assessment & Plan Note (Signed)
mild

## 2014-03-05 NOTE — Assessment & Plan Note (Signed)
We discussed age appropriate health related issues, including available/recomended screening tests and vaccinations. We discussed a need for adhering to healthy diet and exercise. Labs/EKG were reviewed/ordered. All questions were answered.    Milk free trial (no milk, ice cream, cheese and yogurt) for 4-6 weeks. OK to use almond, coconut, rice or soy milk. "Almond breeze" brand tastes good.  Paleo blueprint diet

## 2014-03-05 NOTE — Assessment & Plan Note (Signed)
Chronic  Continue with current prescription therapy as reflected on the Med list.  

## 2014-03-05 NOTE — Progress Notes (Signed)
Pre visit review using our clinic review tool, if applicable. No additional management support is needed unless otherwise documented below in the visit note. 

## 2014-03-06 LAB — ALLERGEN FOOD PROFILE SPECIFIC IGE: IGE (IMMUNOGLOBULIN E), SERUM: 1497 kU/L — AB (ref <115)

## 2014-03-06 LAB — RESPIRATORY ALLERGY PROFILE REGION II ~~LOC~~

## 2014-03-18 ENCOUNTER — Ambulatory Visit (INDEPENDENT_AMBULATORY_CARE_PROVIDER_SITE_OTHER): Payer: 59 | Admitting: Pulmonary Disease

## 2014-03-18 ENCOUNTER — Encounter: Payer: Self-pay | Admitting: Pulmonary Disease

## 2014-03-18 VITALS — BP 116/72 | HR 65 | Temp 97.7°F | Ht 70.0 in | Wt 223.8 lb

## 2014-03-18 DIAGNOSIS — R768 Other specified abnormal immunological findings in serum: Secondary | ICD-10-CM

## 2014-03-18 DIAGNOSIS — J449 Chronic obstructive pulmonary disease, unspecified: Secondary | ICD-10-CM

## 2014-03-18 DIAGNOSIS — R760 Raised antibody titer: Secondary | ICD-10-CM

## 2014-03-18 DIAGNOSIS — R911 Solitary pulmonary nodule: Secondary | ICD-10-CM

## 2014-03-18 MED ORDER — BUDESONIDE-FORMOTEROL FUMARATE 160-4.5 MCG/ACT IN AERO: 2.0000 | INHALATION_SPRAY | Freq: Two times a day (BID) | RESPIRATORY_TRACT | Status: DC

## 2014-03-18 NOTE — Patient Instructions (Signed)
Today we updated your med list in our EPIC system...    We decided to change the [facility] (too $$$ on your plan) to SYMBICORT 160- 2 sprays twice daily...  We will contact you w/ the results of the RAST blood tests when avail...  Call for any questions...  Let's plan a follow up visit in 63mo, sooner if needed for problems.Marland KitchenMarland Kitchen

## 2014-03-18 NOTE — Progress Notes (Addendum)
Subjective:    Patient ID: Larry Stone, male    DOB: November 27, 1950, 63 y.o.   MRN: [id number]  HPI 63 y/o WM here for a followup visit... he has multiple medical problems as noted below...  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 and DrPlotnikov for Primary Care- Chol, Impaired fasting glucose, etc... ~  SEE PREV EPIC NOTES FOR OLDER DATA >>   ~  May 15, 2012:  34mo ROV & Larry Stone has had a good 6mo he says- no new complaints or concerns; he has an impressive list of meds...     OSA> uses Transderm scope prn dizziness, uses "breathe right nasal strips" periodically; resting OK, no daytime hypersomnolence...    HxBronchitis> on Zyrtek, Tussionex; doing satis w/o cough, phlegm, etc...    HBP> on Coreg 12.5Bid, Amlod5, Lasix40 prn, K20/d; BP= 120/80 & he denies interval CP, palpit, SOB, edema...    CAD, RBBB> on Plavix75; he saw DrMcAlhany 11/13> CAD w/ AMI 2004 & residual 50% Lmain; stable, exercising regularly, no angina & no changes made...    Hyperlipid> on Lip40, Niasp500, FishOil, KrillOil, CoQ10; FLP shows TChol 114, TG 31, HDL 45, LDL 63    DM> on diet alone; wt= 214# & FBS= 112...    GI- GERD, Divetrtics, Polyps> on Simethacone for gas; father died of colon cancer age 52; last colonoscopy 10/07 by Deatra Ina & showed divertics only- last polyp removed 2004- he is overdue.    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    DJD, LBP> on Osteobiflex, MVI, VitD5000; stable & exercising regularly...    Memory Loss, Anxiety> on Wellbutrin & Alpraz...    ?Hx Vit B12 defic> on B12 2037mcg po daily (dosed on his own); B12 level = 1005 & ok to decr B12 supplement to 1065mcg/d...    Anxiety> on WellbutrinSR 150Bid, Xanax 0,5mg  prn;  We reviewed prob list, meds, xrays and  labs> see below for updates >> he had the 2013 Flu vaccine 11/13  CXR 1/14 showed normal heart size, clear lungs, chr right rib deformity (old fx), NAD...  LABS 1/14:  FLP- at goals on Lip40+Niasp;  Chems- wnl;  CBC- wnl;  TSH=1.93;  PSA=0.39;  Testos=307 (350-890) on Testim 2tubes/d;  B12=1005 (211-911) on B12 2048mcg/d...  ~  November 14, 2012:  57mo ROV & Larry Stone has had a good interval w/o new complaints or concerns;  He has gained 9# up to 223# today & we reviewed diet, exercise , & wt reduction strategies...    He remains on Coreg & Amlodipine, uses Lasix just as needed & rarely needs it); BP= 116/74 & he remains asymptomatic...    Followed by [facility] for Cards on Plavix> seen yearly in the fall & stable w/o CP, palpit, etc...    He remaiins on Lip40 along w/ CoQ10, krill oil, Fish oil> FLP 1/14 was great, continue same rx...    He had f/u colonoscopy 5/14 by DrKaplan (done w/ pt on Plavix rx)> mild divertics & 25mm polyp in desc colon removed (tubular adenoma) & repeat rec in 5 yrs...     He continues on Testim using 1 tube daily; +Viagra/Cialis for ED; he notes energy good, feeling well, no issues reported... We reviewed prob list, meds, xrays and labs> see below for updates >>  OK TDAP today...  ~  July 10, 2013:  80mo ROV & Larry Stone has been stable, notes occas HAs but BP has been OK at home, and he is under some stress...     OSA> uses Transderm scope prn dizziness, uses "breathe right nasal strips" periodically; resting OK, no daytime hypersomnolence...    HxBronchitis> on Zyrtek prn; doing satis w/o cough, phlegm, etc...    HBP> on Coreg 12.5Bid, Amlod5, Lasix40 prn; BP= 124/80 & he denies interval CP, palpit, SOB, edema...    CAD, RBBB> on Plavix75; he last saw DrMcAlhany 11/13> CAD w/ AMI 2004 & residual 50% Lmain; stable, exercising regularly, no angina & no changes made; due for Cards f/u appt...    Hyperlipid> on Lip40, CoQ10; FLP 4/15 shows TChol 133, TG 50, HDL 43, LDL 80    DM> on diet  alone; wt= 223# & BMI=32; Labs 4/15 showed FBS= 115, A1c= 6.0..Marland Kitchen    GI- GERD, Divetrtics, Polyps> on Simethacone for gas; father died of colon cancer age 12; last colonoscopy 5/14 by Deatra Ina & showed divertics & 42mm polyp= tub adenoma, repeat 74yrs.    GU- Hypogonad> prev on Testim 2tubes daily, +Viagra&Cialis as needed; labs 4/15 showed PSA=0.39 and he is feeling well, good energy/ libido/ etc...    DJD, LBP> on Osteobiflex, MVI, VitD5000; stable & exercising regularly...    Memory Loss, Anxiety> on Wellbutrin & Alpraz...    ?Hx Vit B12 defic> on B12 2070mcg po daily (dosed on his own); B12 level = 1005 & ok to decr B12 supplement...    Anxiety> on WellbutrinSR 150Bid, Xanax 0,5mg  prn... We reviewed prob list, meds, xrays and labs> see below for updates >> he had the 2014 flu vaccine 11/14...  LABS 4/15:  FLP- at goals on Lip40;  Chems- ok w/ BS=115, A1c=6.0, Cr=1.2;  CBC- wnl;  TSH=1.55;  PSA=0.39...   ~  December 13, 2013:  35mo ROV & Larry Stone has established w/ DrPlotnikov for Primary Care, seen 5/15 & note reviewed...  He requested Pulm f/u visit due to concern over several recurrent bronchitic infections over the last few months- treated w/ ZPaks and improved but he & wife are concerned as to why he is getting these; he uses OTC Zyrtek10mg /d, he is an ex-smoker (1ppd for 77yrs, quit 2004), hx tiny 63mm nodule in RML on prior CT Chest along w/ mild centrilob emphysema & no changes serially- last CXR 1/14 w/ norm heart size, clear lungs x mild bronchitic change, and chr right posterolat rib deformity... He is c/o several bouts of bronchitis w/ cough, beige sput, denies f/c/s, but feels tight/ congested/ occas wheezing & SOB, no CP;  I cannot find prev PFTs and we discussed f/u CXR, PFT, and Labs to check for the unlikely presence of an immune defic syndrome... We reviewed prob list, meds, xrays and labs> see below for updates >>   CXR 8/15 showed normal heart size, clear lungs w/o infiltrates, old healed  right rib fxs, NAD...   PFT 8/15 showed FVC=2.32 (50%), FEV1=1.63 (45%), %1sec=70, mid-flows=33% predicted; suggestive of GOLD Stage 3 COPD but can't r/o superimposed restriction w/o lung volume measurement...  LABS 8/15:  SPE/IEP w/o monoclonal gammopathy; Quant Ig's showed normal IgG, normal IgA, low IgM at 26 (40-250), and a hi IgE at 1362 => we will follow w/ RAST panel & consider Xolair Rx...  RAST Panel 9/15 => pending PLAN>>  We discussed starting regular dosing of an inhaled ICS/LABA combination=> try DULERA100- 2spBid; ROV in 3 mo &  we will plan Full PFTs at that time...  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  ~  March 18, 2014:  60mo ROV & f/u dyspnea> Larry Stone states that the [facility] was $400 at the Pharm & they/he didn't call for replacement inhaler, states he filled it one time & ran out several wks ago, we discussed what to do in that eventuality (call us to let us know & get a different Rx), we will try SYMBICORT160-2spBid & he will let us know what his [employer] co-pay is for this med;  Similarly he did not return to get the RAST tests done, but he informs me that they were drawn several days ago w/ blood work for [employer]- results pending...  We reviewed his prev Hx/ symptoms, exam (chest is clear today), and evaluation; all questions answered, we decided to try the Symbicort160 regularly over the next 1mo & plan f/u Full-PFTs in the future;  In the meanwhile we will await the results of the RAST tests and decide regarding Xolair vs Allergy evaluation...    We reviewed prob list, meds, xrays and labs> see below for updates >> He was given the 2015 flu vaccine recently and the Prevnar-13 vaccination as well...  LABS 11/15:  FLP- at goals on Lip40;  Chems- wnl w/ BS=105, A1c=6.1;  CBC-  wnl w/ Hg=15.1;  TSH=1.77, PSA=0.50...   RAST Test 11/15:  pending ADDENDUM>> RAST tests returned w/ IgE level = 1497 and Allergen panel pos for some molds, grasses, trees, ragweed, cats>dogs; and Food panel pos for Milk> Wheat> Shrimp/ Tomato/ Peanut, etc... We discussed avoidance strategies and other options- he is interested in Xolair & we will proceed w/ the paperwork for Xolair approval...             Problem List:     ALLERGIC to ASA w/ hives, & ACE inhibitors/ ARBs too w/ angioedema.  OBSTRUCTIVE SLEEP APNEA (ICD-327.23) - s/p UPPP surgery in 2004 by DrRedman...  states he's doing OK, denies snoring, no daytime hypersom, wife not complaining... uses ZYRTEK for allergy symtoms. ~  1/13: He has started using the "breathe right nasal strips" qhs for recurrent snoring; still says he rests well, denies daytime hypersomnolence, & declines offer for repeat sleep study to check... ~  7/14:  He reports resting well, no daytime symptoms or sleep issues...  ~  8/15:  He denies sleep disordered breathing...  Hx of BRONCHITIS, RECURRENT (ICD-491.9) - he is an ex-smoker, prev 1 ppd for 91yrs, quit in 2004. COPD, mixed type- w/ mild centrilobular emphysema on CT Chest 10/11, & chronic bronchitis clinically w/ recurrent infections... ~  Hx tiny RML nodule seen on Cardiac CT 2/11 & apparently unchanged from old [place] 2004... ~  10/11: f/u CT Chest for f/u tiny 46mm nodule RML- unchanged xyrs & benign, mild centilob emphysema, +coronary calcif, NAD... ~  1/13: he denies breathing problems, intercurrent infections, etc... ~  1/14:  CXR 1/14 showed normal heart size, clear lungs, chr right rib deformity (old fx), NAD.  ~  8/15: he was concerned about several recent bouts of bronchitis requiring ZPak for Rx> we checked labs and Immunoglobulins =>   Decided to start inhaler w/ DULERA100-2spBid...  ~  CXR 8/15 showed normal heart size, clear lungs w/o  infiltrates, old healed right rib fxs, NAD. ~  PFT 8/15  showed FVC=2.32 (50%), FEV1=1.63 (45%), %1sec=70, mid-flows=33% predicted; suggestive of GOLD Stage 3 COPD but can't r/o superimposed restriction w/o lung volume measurement. ~  LABS 8/15 showed SPE/IEP w/o monoclonal gammopathy; Quant Ig's showed normal IgG, normal IgA, low IgM at 26 (40-250), and a hi IgE at 1362 => we will follow w/ RAST panel & consider Xolair Rx. ~  11/15: he didn't fill the [facility] due to cost- we decided to try SYMBICORT160-2spBid... ~  LABS 11/15 showed RAST Panel (pending)    MEDICAL PROBLEMS PER DrPlotnikov >>   HYPERTENSION (ICD-401.9) - on COREG 12.5mg Bid, AMLODIPINE 5mg /d, & LASIX 40mg - only as needed (& he hasn't needed any); BP= 116/72 & he denies CP, palpit, ch in SOB, edema...  ARTERIOSCLEROTIC HEART DISEASE (ICD-414.00) - on PLAVIX 75mg /d, allergic to ASA... followed by [facility] and seen 6/15 (note reviewed)... 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 ~  f/u Cardiac CT 2/11 showed>  Calcified plaque in the left main with < 50% stenosis.  Calcified plaque in the proximal and mid LAD with at most moderate (around 50%) stenosis in the mid LAD. ~  Vasc Screen per Cards 10/11 showed mild carotid plaques, norm AbdAo & ABIs...  HYPERLIPIDEMIA (ICD-272.4) - on LIPITOR 40mg /d, & CoQ10... Last FLP 11/15 on Lip40 showed TChol 153, TG 42, HDL 53, LDL 92  DIABETES MELLITUS, BORDERLINE (ICD-790.29) - on diet alone... Labs 11/15- weight= 224#, BS=105, A1c=6.1  GERD (ICD-530.81) - he uses H2 blockers as needed.  DIVERTICULOSIS OF COLON (ICD-562.10) & COLONIC POLYPS (ICD-211.3) - his last polyps were ~18mm and removed in 2004= adenomatous... there is a +fam hx of colon cancer in his father who died at age 36... ~  Last colonoscopy 5/14 by DrKaplan (done w/ pt on Plavix rx)> mild divertics & 16mm polyp in desc colon removed (tubular adenoma) & repeat rec in 5 yrs.   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 ~  3/15: he is off the prev Testos replacement Rx> states he feels well, good energy, PSA=0.39  DEGENERATIVE JOINT DISEASE (ICD-715.90) - he reports doing fair- mostly c/o knees & hands ("I messed up my knee in a fall")... he saw Ortho ?who? on Glucosamine/ Chondroitin supplements.  LOW BACK PAIN SYNDROME (ICD-724.2)  MEMORY LOSS >>  ~  1/13: he states that his "memory is fuzzy" & "Clarity is missing"; occas he will swear that he said something but he really didn't; he's had prev scans etc & I have recommended a Neurology eval for his memory but he declines at this time "I'll think about it" he said...  ANXIETY (ICD-300.00) - on WELLBUTRIN 150mg Bid, & ALPRAZOLAM 0.5mg  Prn... increased stress w/ mother's stroke last year... he's the main care giver w/ 2 sisters who Larry Stone't help much...   Health Maintenance: ~  Immuniztions:  he is encouraged to get the yearly Flu vaccine... given PNEUMOVAX 2/12 & PREVNAR-13 given 11/15... He reports a lot of travel & he's been to the travel clinic & had "every vaccine known to man"> TDAP 7/14 and Zoster 5/14...   Past Surgical History  Procedure Laterality Date  . Uvulopalatopharyngoplasty      surgery for OSA  . Cardiac catheterization      Outpatient Encounter Prescriptions as of 03/18/2014  Medication Sig  . ALPRAZolam (XANAX) 0.5 MG tablet TAKE 1 TABLET BY MOUTH 3  TIMES A DAY AS NEEDED  . amLODipine (NORVASC) 5 MG tablet TAKE 1 TABLET BY MOUTH DAILY  . ammonium lactate (LAC-HYDRIN) 12 % lotion Apply topically as directed.  Marland Kitchen atorvastatin (LIPITOR) 40 MG tablet TAKE 1 TABLET BY MOUTH EVERY DAY  . b complex vitamins tablet Take 1 tablet by mouth daily.  Marland Kitchen buPROPion (WELLBUTRIN SR) 150 MG 12 hr tablet TAKE 1 TABLET BY MOUTH TWICE A DAY  . carvedilol (COREG) 12.5 MG tablet TAKE 1 TABLET BY MOUTH TWICE A DAY WITH A MEAL  . cetirizine (ZYRTEC) 10 MG tablet Take 1 tablet (10 mg total) by mouth daily.  . Cholecalciferol (VITAMIN D3) 5000  UNITS CAPS Take one tablet by mouth daily  . clopidogrel (PLAVIX) 75 MG tablet TAKE 1 TABLET BY MOUTH DAILY  . clotrimazole-betamethasone (LOTRISONE) cream APPLY AS DIRECTED  . Coenzyme Q10 (COQ10) 100 MG CAPS Take as directed  . cyanocobalamin 2000 MCG tablet Take 1 tablet (2,000 mcg total) by mouth daily.  . furosemide (LASIX) 40 MG tablet TAKE 1 TABLET BY MOUTH DAILY (Patient taking differently: as needed)  . nitroGLYCERIN (NITROSTAT) 0.4 MG SL tablet Place 1 tablet (0.4 mg total) under the tongue every 5 (five) minutes as needed. For up to 3 doses.  . sildenafil (VIAGRA) 100 MG tablet Take 1 tablet (100 mg total) by mouth as needed for erectile dysfunction.  . Simethicone (GAS-X EXTRA STRENGTH) 125 MG CAPS Prn  . [DISCONTINUED] mometasone-formoterol (DULERA) 100-5 MCG/ACT AERO Inhale 2 puffs into the lungs 2 (two) times daily.  . budesonide-formoterol (SYMBICORT) 160-4.5 MCG/ACT inhaler Inhale 2 puffs into the lungs 2 (two) times daily.    Allergies  Allergen Reactions  . Aspirin     REACTION: allergic to ASA w/ hives...  . Codeine   . Irbesartan     REACTION: allergic to ARBs w/ angioedema  . Ramipril     REACTION: Allergic to ACE's w/ angioedema...    Current Medications, Allergies, Past Medical History, Past Surgical History, Family History, and Social History were reviewed in [employer] record.    Review of Systems       See HPI - all other systems neg except as noted...       The patient complains of dyspnea on exertion and muscle weakness.  The patient denies anorexia, fever, weight loss, weight gain, vision loss, decreased hearing, hoarseness, chest pain, syncope, peripheral edema, prolonged cough, headaches, hemoptysis, abdominal pain, melena, hematochezia, severe indigestion/heartburn, hematuria, incontinence, suspicious skin lesions, transient blindness, difficulty walking, depression, unusual weight change, abnormal bleeding, enlarged lymph nodes,  and angioedema.     Objective:   Physical Exam    WD, sl overweight, 63 y/o WM in NAD... GENERAL:  Alert & oriented; pleasant & cooperative... HEENT:  [place]/AT, EOM-wnl, PERRLA, EACs-clear, TMs-wnl, NOSE-clear, THROAT-s/p UPPP surg... NECK:  Supple w/ fairROM; no JVD; normal carotid impulses w/o bruits; no thyromegaly or nodules palpated; no lymphadenopathy. CHEST:  Clear to P & A; without wheezes/ rales/ or rhonchi. HEART:  Regular Rhythm; without murmurs/ rubs/ or gallops. ABDOMEN:  Soft & nontender; normal bowel sounds; no organomegaly or masses detected. EXT: without deformities, mild arthritic changes; no varicose veins/ venous insuffic/ or edema. Neuro:  intact w/o focal abn detected... DERM:  No lesions noted; no rash etc...  RADIOLOGY DATA:  Reviewed in the EPIC EMR & discussed w/ the patient...   LABORATORY DATA:  Reviewed in the EPIC EMR & discussed w/ the patient...     Assessment &  Plan:    Hx OSA, s/p UPPP in 2004>  Prev noted recurrent snoring & using breathe right nasal strips; he didn't want repeat sleep study or further eval, just want Rx for the strips so he can pay for them w/ his flex acct...   Hx recurrent bronchitis, underlying COPD (mixed type)>  He quit smoking in 2004 & c/o recurrent bronchitic exacerbations=> PFT shows mod obstructive dis & we tried DULERA but too $$$ therefore switch to SYMBICORT160-2spBid regularly w/ f/u PFTs later...  Medical issues per DrPlotnikov>  HBP, CAD s/pMI, VI/ edema, HL, IFG, GERD/ Divertics/ Polyps, GU/ Low-T, DJD/ LBP, Anxiety...    Patient's Medications  New Prescriptions   BUDESONIDE-FORMOTEROL (SYMBICORT) 160-4.5 MCG/ACT INHALER    Inhale 2 puffs into the lungs 2 (two) times daily.  Previous Medications   ALPRAZOLAM (XANAX) 0.5 MG TABLET    TAKE 1 TABLET BY MOUTH 3 TIMES A DAY AS NEEDED   AMLODIPINE (NORVASC) 5 MG TABLET    TAKE 1 TABLET BY MOUTH DAILY   AMMONIUM LACTATE (LAC-HYDRIN) 12 % LOTION    Apply topically as  directed.   ATORVASTATIN (LIPITOR) 40 MG TABLET    TAKE 1 TABLET BY MOUTH EVERY DAY   B COMPLEX VITAMINS TABLET    Take 1 tablet by mouth daily.   BUPROPION (WELLBUTRIN SR) 150 MG 12 HR TABLET    TAKE 1 TABLET BY MOUTH TWICE A DAY   CARVEDILOL (COREG) 12.5 MG TABLET    TAKE 1 TABLET BY MOUTH TWICE A DAY WITH A MEAL   CETIRIZINE (ZYRTEC) 10 MG TABLET    Take 1 tablet (10 mg total) by mouth daily.   CHOLECALCIFEROL (VITAMIN D3) 5000 UNITS CAPS    Take one tablet by mouth daily   CLOPIDOGREL (PLAVIX) 75 MG TABLET    TAKE 1 TABLET BY MOUTH DAILY   CLOTRIMAZOLE-BETAMETHASONE (LOTRISONE) CREAM    APPLY AS DIRECTED   COENZYME Q10 (COQ10) 100 MG CAPS    Take as directed   CYANOCOBALAMIN 2000 MCG TABLET    Take 1 tablet (2,000 mcg total) by mouth daily.   FUROSEMIDE (LASIX) 40 MG TABLET    TAKE 1 TABLET BY MOUTH DAILY   NITROGLYCERIN (NITROSTAT) 0.4 MG SL TABLET    Place 1 tablet (0.4 mg total) under the tongue every 5 (five) minutes as needed. For up to 3 doses.   SILDENAFIL (VIAGRA) 100 MG TABLET    Take 1 tablet (100 mg total) by mouth as needed for erectile dysfunction.   SIMETHICONE (GAS-X EXTRA STRENGTH) 125 MG CAPS    Prn  Modified Medications   No medications on file  Discontinued Medications   MOMETASONE-FORMOTEROL (DULERA) 100-5 MCG/ACT AERO    Inhale 2 puffs into the lungs 2 (two) times daily.

## 2014-04-03 ENCOUNTER — Encounter: Payer: Self-pay | Admitting: Pulmonary Disease

## 2014-04-16 ENCOUNTER — Ambulatory Visit (INDEPENDENT_AMBULATORY_CARE_PROVIDER_SITE_OTHER): Payer: 59 | Admitting: Cardiovascular Disease

## 2014-04-16 ENCOUNTER — Encounter: Payer: Self-pay | Admitting: Cardiovascular Disease

## 2014-04-16 VITALS — BP 138/80 | HR 68 | Ht 70.0 in | Wt 218.4 lb

## 2014-04-16 DIAGNOSIS — I10 Essential (primary) hypertension: Secondary | ICD-10-CM

## 2014-04-16 DIAGNOSIS — I2510 Atherosclerotic heart disease of native coronary artery without angina pectoris: Secondary | ICD-10-CM

## 2014-04-16 DIAGNOSIS — E785 Hyperlipidemia, unspecified: Secondary | ICD-10-CM

## 2014-04-16 NOTE — Patient Instructions (Signed)
Your physician wants you to follow-up in:  6 months. You will receive a reminder letter in the mail two months in advance. If you don't receive a letter, please call our office to schedule the follow-up appointment.   

## 2014-04-16 NOTE — Progress Notes (Signed)
History of Present Illness: 63 yo male with history of CAD, HLD, HTN, OSA here today for cardiac follow up. He has been followed in the past by Dr. Haroldine Laws. 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 He also has residual 50% left main stenosis and nonobstructive disease elsewhere. Cardiac CT in 05/2009 with LM < 50% LAD 50% LCX dominant nonobs RCA small no critical lesions. Small lung nodule. F/u CT showed stable pulm nodule. Carotid u/s 11/11 with minimal carotid plaque. Exercise treadmill stress test 08/30/13 but did not achieve target heart rate. Lexiscan stress myoview 10/03/13 with inferolateral scar, no ischemia, LVEF=52%.   He is here today for follow up. He is doing well. He has not been exercising every day. No CP or SOB.   Primary Care Physician: Teressa Lower  Last Lipid Profile:Lipid Panel     Component Value Date/Time   CHOL 153 03/05/2014 1112   TRIG 42.0 03/05/2014 1112   HDL 53.10 03/05/2014 1112   CHOLHDL 3 03/05/2014 1112   VLDL 8.4 03/05/2014 1112   LDLCALC 92 03/05/2014 1112     Past Medical History  Diagnosis Date  . CAD     --8/09: ETT normal --acute inferior lateral wall infarction in September 2004 treated medically.  --cardiac CT 2006  residual 50% left main stenosis and nonobstructive disease elsewhere. EF normal   --cardiac CT 05/2009. LM. <50% LAD. 50%  . HTN (hypertension)   . Hyperlipidemia   . Pharyngitis   . URI (upper respiratory infection)   . OSA (obstructive sleep apnea)   . Recurrent aspiration bronchitis/pneumonia   . Diabetes mellitus   . GERD (gastroesophageal reflux disease)   . Diverticulosis   . Colonic polyp   . DJD (degenerative joint disease)   . Low back pain syndrome   . Anxiety   . Myocardial infarction 2004    Past Surgical History  Procedure Laterality Date  . Uvulopalatopharyngoplasty      surgery for OSA  . Cardiac catheterization      Current Outpatient Prescriptions  Medication  Sig Dispense Refill  . ALPRAZolam (XANAX) 0.5 MG tablet TAKE 1 TABLET BY MOUTH 3 TIMES A DAY AS NEEDED 90 tablet 1  . amLODipine (NORVASC) 5 MG tablet TAKE 1 TABLET BY MOUTH DAILY 90 tablet 1  . ammonium lactate (LAC-HYDRIN) 12 % lotion Apply topically as directed. 400 g 3  . atorvastatin (LIPITOR) 40 MG tablet TAKE 1 TABLET BY MOUTH EVERY DAY 90 tablet 1  . b complex vitamins tablet Take 1 tablet by mouth daily.    . budesonide-formoterol (SYMBICORT) 160-4.5 MCG/ACT inhaler Inhale 2 puffs into the lungs 2 (two) times daily. 1 Inhaler 6  . buPROPion (WELLBUTRIN SR) 150 MG 12 hr tablet TAKE 1 TABLET BY MOUTH TWICE A DAY 180 tablet 1  . carvedilol (COREG) 12.5 MG tablet TAKE 1 TABLET BY MOUTH TWICE A DAY WITH A MEAL 180 tablet 1  . cetirizine (ZYRTEC) 10 MG tablet Take 1 tablet (10 mg total) by mouth daily. 30 tablet 11  . Cholecalciferol (VITAMIN D3) 5000 UNITS CAPS Take one tablet by mouth daily    . clopidogrel (PLAVIX) 75 MG tablet TAKE 1 TABLET BY MOUTH DAILY 90 tablet 1  . clotrimazole-betamethasone (LOTRISONE) cream APPLY AS DIRECTED 45 g 11  . Coenzyme Q10 (COQ10) 100 MG CAPS Take as directed 30 each 11  . cyanocobalamin 2000 MCG tablet Take 1 tablet (2,000 mcg total) by mouth daily. [place]  tablet 11  . furosemide (LASIX) 40 MG tablet TAKE 1 TABLET BY MOUTH DAILY (Patient taking differently: as needed) 90 tablet 1  . nitroGLYCERIN (NITROSTAT) 0.4 MG SL tablet Place 1 tablet (0.4 mg total) under the tongue every 5 (five) minutes as needed. For up to 3 doses. 25 tablet 6  . sildenafil (VIAGRA) 100 MG tablet Take 1 tablet (100 mg total) by mouth as needed for erectile dysfunction. 15 tablet 3  . Simethicone (GAS-X EXTRA STRENGTH) 125 MG CAPS Prn     No current facility-administered medications for this visit.    Allergies  Allergen Reactions  . Aspirin     REACTION: allergic to ASA w/ hives...  . Codeine   . Irbesartan     REACTION: allergic to ARBs w/ angioedema  . Ramipril      REACTION: Allergic to ACE's w/ angioedema...    History   Social History  . Marital Status: Married    Spouse Name: N/A    Number of Children: 1  . Years of Education: N/A   Occupational History  . [employer] History Main Topics  . Smoking status: Former Smoker -- 1.00 packs/day for 20 years    Types: Cigarettes, Cigars    Quit date: 05/08/1998  . Smokeless tobacco: Never Used     Comment: quit in 2005  . Alcohol Use: 0.6 oz/week    1 Not specified per week     Comment: social drinker  . Drug Use: No  . Sexual Activity: Yes   Other Topics Concern  . Not on file   Social History Narrative    Family History  Problem Relation Age of Onset  . Colon cancer Father   . Stroke Mother   . Heart disease Mother   . Hypertension Sister   . Diabetes Sister     Review of Systems:  As stated in the HPI and otherwise negative.   BP 138/80 mmHg  Pulse 68  Ht 5\' 10"  (1.778 m)  Wt 218 lb 6.4 oz (99.066 kg)  BMI 31.34 kg/m2  SpO2 98%  Physical Examination: General: Well developed, well nourished, NAD HEENT: OP clear, mucus membranes moist SKIN: warm, dry. No rashes. Neuro: No focal deficits Musculoskeletal: Muscle strength 5/5 all ext Psychiatric: Mood and affect normal Neck: No JVD, no carotid bruits, no thyromegaly, no lymphadenopathy. Lungs:Clear bilaterally, no wheezes, rhonci, crackles Cardiovascular: Regular rate and rhythm. No murmurs, gallops or rubs. Abdomen:Soft. Bowel sounds present. Non-tender.  Extremities: No lower extremity edema. Pulses are 2 + in the bilateral DP/PT.  Echo 08/30/13: Left ventricle: The cavity size was normal. Systolic function was normal. The estimated ejection fraction was in the range of 55% to 60%. Probable mild hypokinesis of the basal-midinferolateral and inferior myocardium. Doppler parameters are consistent with abnormal left ventricular relaxation (grade 1 diastolic dysfunction). - Mitral  valve: Calcified annulus.  Stress myoview 10/03/13: Stress Procedure: The patient received IV Lexiscan 0.4 mg over 15-seconds. Technetium 89m Sestamibi injected at 30-seconds. This patient had sob and an "Uncomfortable Fullness" with the Lexiscan injection. Quantitative spect images were obtained after a 45 minute delay.  Stress ECG: No significant change from baseline ECG  QPS  Raw Data Images: Normal; no motion artifact; normal heart/lung ratio.  Stress Images: There is a large size severe severity perfusion defect in the basal and mid inferior and inferolateral walls.  Rest Images: There is a large size severe severity perfusion defect in the basal and  mid inferior and inferolateral walls.  Subtraction (SDS): SDS 2  Transient Ischemic Dilatation (Normal <1.22): 1.06  Lung/Heart Ratio (Normal <0.45): 0.32  Quantitative Gated Spect Images  QGS EDV: 135 ml  QGS ESV: 65 ml  Impression  Exercise Capacity: Lexiscan with no exercise.  BP Response: Normal blood pressure response.  Clinical Symptoms: No significant symptoms noted.  ECG Impression: No significant ST segment change suggestive of ischemia.  Comparison with Prior Nuclear Study: No significant change from previous study  Overall Impression: Intermediate risk stress nuclear study with a large infarct in the basal and mid inferior and inferolateral walls with mild periinfarct ischemia in the apical inferior wall (SDS 2)..  LV Ejection Fraction: 52%. LV Wall Motion: akinesis of the basal and mid inferior and inferolateral walls.   Assessment and Plan:  1. CAD: Stable. Stress test 10/03/13 shows inferolateral scar to be expected after his cardiac event in 2004 with occluded OM branch. No other ischemia. LV function normal.  Continue current regimen.   2. HYPERTENSION:  Blood pressure well controlled. Continue current regimen.   3. HYPERLIPIDEMIA: LDL is not at goal. We discussed raising his dose of lipitor but he wishes to try more  exercise and dietary changes. Continue current regimen.

## 2014-04-22 ENCOUNTER — Telehealth: Payer: Self-pay | Admitting: Pulmonary Disease

## 2014-04-22 NOTE — Telephone Encounter (Signed)
Larry Stone can you have SN address these results. Thanks!

## 2014-04-22 NOTE — Telephone Encounter (Signed)
SN please advise on the pts allergy and RAST test results.  thanks

## 2014-04-22 NOTE — Telephone Encounter (Signed)
Please have SN address pt's allergy/RAST results. Dr. [name] ordered these tests but pt states that SN was the one wanted them ordered. There was a patient advice request sent but I accidentally closed it before sending it to you.

## 2014-04-23 ENCOUNTER — Telehealth: Payer: Self-pay | Admitting: *Deleted

## 2014-04-23 NOTE — Telephone Encounter (Signed)
OK to fill this prescription with additional refills x3 Thank you!  

## 2014-04-23 NOTE — Telephone Encounter (Signed)
Rf req for Alprazolam 0.5 mg 1 po tid prn. # 90. Last filled 02/21/2014. Ok to Rf?

## 2014-04-24 MED ORDER — ALPRAZOLAM 0.5 MG PO TABS: ORAL_TABLET | ORAL | Status: DC

## 2014-04-24 NOTE — Telephone Encounter (Signed)
Done

## 2014-04-25 NOTE — Telephone Encounter (Signed)
SN called and spoke with pt about his allergy profile results--will start the pt on xolair---i will call and have the pt come in to pick up information on xolair and to sign a few papers needed to get this process started.  lmomtcb x 1 for pt

## 2014-04-25 NOTE — Telephone Encounter (Signed)
Pt returning call - [phone]

## 2014-04-25 NOTE — Telephone Encounter (Signed)
Spoke with the pt and notified of the below recs per Leigh  He verbalized understanding

## 2014-06-04 ENCOUNTER — Telehealth: Payer: Self-pay | Admitting: Pulmonary Disease

## 2014-06-04 DIAGNOSIS — R768 Other specified abnormal immunological findings in serum: Secondary | ICD-10-CM

## 2014-06-04 NOTE — Telephone Encounter (Signed)
Will route message to Marliss Czar so that she may contact pt.

## 2014-06-06 NOTE — Telephone Encounter (Signed)
Pt will be by sometime this week to fill out these forms.  If i am not here, the forms are on the right side of my desk.  Joellen Jersey is aware that pt is coming too.

## 2014-06-06 NOTE — Telephone Encounter (Signed)
Has this pt been contacted? I didn't know what your schedule looked like.

## 2014-06-11 NOTE — Telephone Encounter (Signed)
Katie called and lmomtcb for the pt.  He will need to speak with Joellen Jersey to discuss xolair.  Papers are on my desk at this time.

## 2014-06-11 NOTE — Telephone Encounter (Signed)
Leigh please advise on the status of these forms.  Thanks!

## 2014-06-14 NOTE — Telephone Encounter (Signed)
Katie please advise if you've spoken with this patient.  Thanks!

## 2014-06-14 NOTE — Telephone Encounter (Signed)
Called and spoke with patient-he states he will call me back today in about an hour or so. Pt will ask for me directly. I am at ext [phone] this afternoon. Thanks.

## 2014-06-14 NOTE — Telephone Encounter (Signed)
Pt called back-he will come by Wednesday 06-19-14 around 10:30am to 11:00am to speak with me about Xolair and sign papers. I have left information for front staff that patient is coming by.

## 2014-06-19 NOTE — Telephone Encounter (Signed)
Larry Stone came into the office today and spoke with Joellen Jersey about the xolair process.  Joellen Jersey will work on getting this approved for him through his insurance.  Will forward to Katie to follow up on.

## 2014-06-25 NOTE — Telephone Encounter (Signed)
Can you give an update on this please? Thanks.

## 2014-06-26 NOTE — Telephone Encounter (Signed)
Xolair packet has been signed by SN and faxed back to [facility] on 06-25-14. Will keep updated as able.

## 2014-07-04 NOTE — Telephone Encounter (Signed)
Larry Stone, have you heard back from [place] yet? Thanks.

## 2014-07-04 NOTE — Telephone Encounter (Signed)
I have received additional information from [place] on patient;will update on Friday 07-05-14 afternoon.

## 2014-07-05 NOTE — Telephone Encounter (Signed)
Forms from [place] with [employer]. Shows patient needs PA- after calling [employer] at [phone]; no PA is needed. A Pre determination is needed showing why physician feels the patient needs the Xolair. Letter must include Cover sheet, words "pre determination", member ID #, pt name and DOB, CPT code, dx code, description of service, place where injections will be given, drs name, and return fax #. This process can take up to 21 days.    I have attempted to contact the patient to let him know at number listed in [employer]. No answer and unable to leave a message.

## 2014-07-07 ENCOUNTER — Other Ambulatory Visit: Payer: Self-pay | Admitting: Cardiovascular Disease

## 2014-07-07 ENCOUNTER — Other Ambulatory Visit: Payer: Self-pay | Admitting: Pulmonary Disease

## 2014-07-08 NOTE — Telephone Encounter (Signed)
Called pt, line rang several times. wcb

## 2014-07-08 NOTE — Telephone Encounter (Signed)
OK to refill Viagra

## 2014-07-09 NOTE — Telephone Encounter (Signed)
Attempted to call pt. No answer. Will try back. 

## 2014-07-10 ENCOUNTER — Telehealth: Payer: Self-pay | Admitting: Pulmonary Disease

## 2014-07-10 NOTE — Telephone Encounter (Signed)
ATC pt. Line rang once then went to silence, atc second time and line rang once then went to high pitch ringing.

## 2014-07-10 NOTE — Telephone Encounter (Signed)
lmtcb x1 

## 2014-07-10 NOTE — Telephone Encounter (Signed)
Pt cb, [phone]

## 2014-07-11 NOTE — Telephone Encounter (Signed)
See phone note from 06/04/14. Will sign off.

## 2014-07-11 NOTE — Telephone Encounter (Signed)
Called and spoke to pt. Informed pt of the status of the process. Pt verbalized understanding.  Will forward to Katie to further update chart as more information is available.

## 2014-07-19 NOTE — Telephone Encounter (Signed)
The pre determination for this patient was faxed on 07-06-14 and will await a decision. Please hold in my basket until taken care of----this can take up to 21 days.

## 2014-07-31 NOTE — Telephone Encounter (Signed)
Spoke with [employer] yesterday-they are seeking information from their end about approval or denial for patient to start Xolair. We have not received any updates on patient. Will hold in my basket until completed. Thanks.

## 2014-08-02 ENCOUNTER — Encounter: Payer: Self-pay | Admitting: Podiatry

## 2014-08-02 ENCOUNTER — Ambulatory Visit (INDEPENDENT_AMBULATORY_CARE_PROVIDER_SITE_OTHER): Payer: 59

## 2014-08-02 ENCOUNTER — Ambulatory Visit (INDEPENDENT_AMBULATORY_CARE_PROVIDER_SITE_OTHER): Payer: 59 | Admitting: Podiatry

## 2014-08-02 VITALS — BP 141/79 | HR 65 | Resp 16 | Ht 69.0 in | Wt 215.0 lb

## 2014-08-02 DIAGNOSIS — M722 Plantar fascial fibromatosis: Secondary | ICD-10-CM | POA: Diagnosis not present

## 2014-08-02 MED ORDER — TRIAMCINOLONE ACETONIDE 10 MG/ML IJ SUSP: 10.0000 mg | Freq: Once | INTRAMUSCULAR | Status: DC

## 2014-08-02 MED ORDER — TRIAMCINOLONE ACETONIDE 10 MG/ML IJ SUSP
10.0000 mg | Freq: Once | INTRAMUSCULAR | Status: AC
  Administered 2014-08-02: 10 mg

## 2014-08-02 NOTE — Progress Notes (Signed)
   Subjective:    Patient ID: Larry Stone, male    DOB: 1950/05/24, 64 y.o.   MRN: [id number]  HPI Comments: "I have pain in the bottom"  Patient c/o sharp plantar/medial heel right for about 3 weeks. He has AM pain. He tried OTC arch supports. Some help.     Review of Systems  Musculoskeletal: Positive for gait problem.  All other systems reviewed and are negative.      Objective:   Physical Exam        Assessment & Plan:

## 2014-08-02 NOTE — Patient Instructions (Signed)

## 2014-08-05 NOTE — Progress Notes (Signed)
Subjective:     Patient ID: Larry Stone, male   DOB: 15-Apr-1951, 64 y.o.   MRN: [id number]  HPI patient states he's had sharp pain in his right plantar heel of 3 weeks duration the makes it hard to walk on or ambulate for distances   Review of Systems  All other systems reviewed and are negative.      Objective:   Physical Exam  Constitutional: He is oriented to person, place, and time.  Musculoskeletal: Normal range of motion.  Neurological: He is oriented to person, place, and time.  Skin: Skin is warm.  Vitals reviewed.  neurovascular status intact muscle strength adequate with range of motion within normal limits. Patient's noted to have good digital perfusion is well oriented 3 and I noted upon palpation there is quite a bit of inflammation in the right plantar heel at the insertion of the tendon into the calcaneus     Assessment:     Plantar fasciitis right with inflammation and fluid around the medial band    Plan:     H&P and condition discussed and went ahead today and injected the right plantar fascia 3 mg Kenalog 5 mg Xylocaine and applied fascial brace with instructions. Begin oral anti-inflammatories gave instructions on physical therapy and reappoint to recheck in the next several weeks

## 2014-08-11 ENCOUNTER — Other Ambulatory Visit: Payer: Self-pay | Admitting: Cardiovascular Disease

## 2014-08-14 NOTE — Telephone Encounter (Signed)
Have we received any on this patient? Thanks.

## 2014-08-23 ENCOUNTER — Ambulatory Visit: Payer: 59 | Admitting: Podiatry

## 2014-08-23 NOTE — Telephone Encounter (Signed)
Spoke with [employer]-states patient will need to speak with [facility] at [phone] to give verbal consent for Xolair; [facility] will contact our office to schedule shipment once completed. Pt is aware of this and contact later today and keep me posted on where things stand.

## 2014-08-28 ENCOUNTER — Encounter: Payer: Self-pay | Admitting: Internal Medicine

## 2014-09-03 ENCOUNTER — Encounter: Payer: Self-pay | Admitting: Internal Medicine

## 2014-09-03 ENCOUNTER — Telehealth: Payer: Self-pay | Admitting: Pulmonary Disease

## 2014-09-03 ENCOUNTER — Ambulatory Visit (INDEPENDENT_AMBULATORY_CARE_PROVIDER_SITE_OTHER): Payer: 59 | Admitting: Internal Medicine

## 2014-09-03 VITALS — BP 120/70 | HR 69 | Wt 219.0 lb

## 2014-09-03 DIAGNOSIS — E785 Hyperlipidemia, unspecified: Secondary | ICD-10-CM | POA: Diagnosis not present

## 2014-09-03 DIAGNOSIS — M5440 Lumbago with sciatica, unspecified side: Secondary | ICD-10-CM

## 2014-09-03 DIAGNOSIS — F411 Generalized anxiety disorder: Secondary | ICD-10-CM

## 2014-09-03 DIAGNOSIS — I2510 Atherosclerotic heart disease of native coronary artery without angina pectoris: Secondary | ICD-10-CM

## 2014-09-03 DIAGNOSIS — I2583 Coronary atherosclerosis due to lipid rich plaque: Secondary | ICD-10-CM

## 2014-09-03 DIAGNOSIS — I10 Essential (primary) hypertension: Secondary | ICD-10-CM | POA: Diagnosis not present

## 2014-09-03 NOTE — Assessment & Plan Note (Signed)
Lipitor 

## 2014-09-03 NOTE — Telephone Encounter (Signed)
I will call [facility] this afternoon and have them call patient while I'm on the phone with them. Thanks.

## 2014-09-03 NOTE — Progress Notes (Signed)
Pre visit review using our clinic review tool, if applicable. No additional management support is needed unless otherwise documented below in the visit note. 

## 2014-09-03 NOTE — Assessment & Plan Note (Signed)
Coreg, amlodipine, Lasix

## 2014-09-03 NOTE — Assessment & Plan Note (Signed)
Coreg, amlodipine, Lasix, Plavix NTG prn

## 2014-09-04 NOTE — Telephone Encounter (Signed)
LMTCB at home number listed as well.

## 2014-09-04 NOTE — Telephone Encounter (Signed)
I called [facility] and got through quick-they attempted to contact patient while I was on hold-pt is not picking up. I have contacted patient myself to have him call back so we can start his Xolair injections.   Had to [facility].

## 2014-09-10 NOTE — Telephone Encounter (Signed)
LMTCB-patient will ask for me directly; left message for patient that I will be out of the office after 2pm today.

## 2014-09-13 ENCOUNTER — Ambulatory Visit: Payer: 59 | Admitting: Podiatry

## 2014-09-17 ENCOUNTER — Ambulatory Visit: Payer: 59 | Admitting: Pulmonary Disease

## 2014-09-19 NOTE — Telephone Encounter (Signed)
LMOM TCB for pt - asked him to ask for Katie directly as requested below.

## 2014-09-20 NOTE — Telephone Encounter (Signed)
Joellen Jersey - has this been taken care of?  Please advise.

## 2014-09-24 NOTE — Telephone Encounter (Signed)
LMTCB-pt will need to let me know if he has be able to reach [facility] to approve Xolair shipment.

## 2014-09-27 NOTE — Telephone Encounter (Signed)
Message should not be closed until I have reached patient/pharmacy and all details have been worked out. Thanks.

## 2014-09-27 NOTE — Telephone Encounter (Signed)
Katie- please advise if we can close this encounter.

## 2014-09-30 NOTE — Telephone Encounter (Signed)
lmomtcb x1 for pt 

## 2014-10-01 NOTE — Telephone Encounter (Signed)
[phone], pt cb

## 2014-10-01 NOTE — Telephone Encounter (Signed)
lmtcb X2 for pt.  

## 2014-10-01 NOTE — Telephone Encounter (Signed)
LMTCB-will ask for Larry Stone.  

## 2014-10-04 NOTE — Telephone Encounter (Signed)
Joellen Jersey- has this been taken care of yet?  Please advise.

## 2014-10-07 NOTE — Telephone Encounter (Signed)
**  MESSAGE CLOSED IN ERROR**  Please advise Larry Stone if this Xolair issue has been taken care of. Thanks.

## 2014-10-09 ENCOUNTER — Other Ambulatory Visit: Payer: Self-pay | Admitting: Internal Medicine

## 2014-10-10 NOTE — Telephone Encounter (Signed)
Called refill into pharmacy spoke with christy gave md approval.../lmb

## 2014-10-15 ENCOUNTER — Encounter: Payer: Self-pay | Admitting: Pulmonary Disease

## 2014-10-15 ENCOUNTER — Other Ambulatory Visit (INDEPENDENT_AMBULATORY_CARE_PROVIDER_SITE_OTHER): Payer: Commercial Managed Care - HMO

## 2014-10-15 ENCOUNTER — Ambulatory Visit (INDEPENDENT_AMBULATORY_CARE_PROVIDER_SITE_OTHER): Payer: Commercial Managed Care - HMO | Admitting: Pulmonary Disease

## 2014-10-15 VITALS — BP 110/70 | HR 68 | Temp 97.4°F | Wt 211.8 lb

## 2014-10-15 DIAGNOSIS — M5440 Lumbago with sciatica, unspecified side: Secondary | ICD-10-CM

## 2014-10-15 DIAGNOSIS — E785 Hyperlipidemia, unspecified: Secondary | ICD-10-CM | POA: Diagnosis not present

## 2014-10-15 DIAGNOSIS — I10 Essential (primary) hypertension: Secondary | ICD-10-CM

## 2014-10-15 DIAGNOSIS — J449 Chronic obstructive pulmonary disease, unspecified: Secondary | ICD-10-CM

## 2014-10-15 DIAGNOSIS — R768 Other specified abnormal immunological findings in serum: Secondary | ICD-10-CM

## 2014-10-15 DIAGNOSIS — F411 Generalized anxiety disorder: Secondary | ICD-10-CM

## 2014-10-15 DIAGNOSIS — R760 Raised antibody titer: Secondary | ICD-10-CM | POA: Diagnosis not present

## 2014-10-15 DIAGNOSIS — I2583 Coronary atherosclerosis due to lipid rich plaque: Secondary | ICD-10-CM

## 2014-10-15 DIAGNOSIS — G4733 Obstructive sleep apnea (adult) (pediatric): Secondary | ICD-10-CM

## 2014-10-15 DIAGNOSIS — I2510 Atherosclerotic heart disease of native coronary artery without angina pectoris: Secondary | ICD-10-CM | POA: Diagnosis not present

## 2014-10-15 LAB — HEMOGLOBIN A1C: HEMOGLOBIN A1C: 5.7 % (ref 4.6–6.5)

## 2014-10-15 LAB — HEPATIC FUNCTION PANEL
ALT: 19 U/L (ref 0–53)
AST: 17 U/L (ref 0–37)
Albumin: 4.3 g/dL (ref 3.5–5.2)
Alkaline Phosphatase: 100 U/L (ref 39–117)
BILIRUBIN DIRECT: 0 mg/dL (ref 0.0–0.3)
TOTAL PROTEIN: 7.3 g/dL (ref 6.0–8.3)
Total Bilirubin: 0.8 mg/dL (ref 0.2–1.2)

## 2014-10-15 LAB — LIPID PANEL
Cholesterol: 130 mg/dL (ref 0–200)
HDL: 47.3 mg/dL (ref >39.00)
LDL Cholesterol: 72 mg/dL (ref 0–99)
NONHDL: 82.7
Total CHOL/HDL Ratio: 3
Triglycerides: 53 mg/dL (ref 0.0–149.0)
VLDL: 10.6 mg/dL (ref 0.0–40.0)

## 2014-10-15 LAB — BASIC METABOLIC PANEL
BUN: 17 mg/dL (ref 6–23)
CALCIUM: 9.4 mg/dL (ref 8.4–10.5)
CO2: 29 mEq/L (ref 19–32)
Chloride: 103 mEq/L (ref 96–112)
Creatinine, Ser: 1 mg/dL (ref 0.40–1.50)
GFR: 79.86 mL/min (ref >60.00)
GLUCOSE: 99 mg/dL (ref 70–99)
POTASSIUM: 4.5 meq/L (ref 3.5–5.1)
Sodium: 139 mEq/L (ref 135–145)

## 2014-10-15 LAB — CK: Total CK: 72 U/L (ref 7–232)

## 2014-10-15 NOTE — Progress Notes (Signed)
Subjective:    Patient ID: Larry Stone, male    DOB: Jul 31, 1950, 64 y.o.   MRN: [id number]  HPI 64 y/o WM here for a followup visit... he has multiple medical problems as noted below...  he is followed by Larry Stone for Cards- Hx HBP, RBBB, CAD, s/p IWMI 9/04, known residual <50%Lmain stenosis & nonobstructive dis elsewhere on Cardiac CT 2/11... and Larry Stone [facility]- Chol, Impaired fasting glucose, etc... ~  SEE PREV EPIC NOTES FOR OLDER DATA >>   ~  May 15, 2012:  64mo ROV & Larry Stone has had a good 74mo he says- no new complaints or concerns; he has an impressive list of meds...     OSA> uses Transderm scope prn dizziness, uses "breathe right nasal strips" periodically; resting OK, no daytime hypersomnolence...    HxBronchitis> on Zyrtek, Tussionex; doing satis w/o cough, phlegm, etc...    HBP> on Coreg 12.5Bid, Amlod5, Lasix40 prn, K20/d; BP= 120/80 & he denies interval CP, palpit, SOB, edema...    CAD, RBBB> on Plavix75; he saw Larry Stone 11/13> CAD w/ AMI 2004 & residual 50% Lmain; stable, exercising regularly, no angina & no changes made...    Hyperlipid> on Lip40, Niasp500, FishOil, KrillOil, CoQ10; FLP shows TChol 114, TG 31, HDL 45, LDL 63    DM> on diet alone; wt= 214# & FBS= 112...    GI- GERD, Divetrtics, Polyps> on Simethacone for gas; father died of colon cancer age 56; last colonoscopy 10/07 by Larry Stone & showed divertics only- last polyp removed 2004- he is overdue.    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    DJD, LBP> on Osteobiflex, MVI, VitD5000; stable & exercising regularly...    Memory Loss, Anxiety> on Wellbutrin & Alpraz...    ?Hx Vit B12 defic> on B12 2053mcg po daily (dosed on his own); B12 level = 1005 & ok to decr B12 supplement to 1067mcg/d...    Anxiety> on WellbutrinSR 150Bid, Xanax 0,5mg  prn;  We reviewed prob list, meds, xrays and  labs> see below for updates >> he had the 2013 Flu vaccine 11/13  CXR 1/14 showed normal heart size, clear lungs, chr right rib deformity (old fx), NAD...  LABS 1/14:  FLP- at goals on Lip40+Niasp;  Chems- wnl;  CBC- wnl;  TSH=1.93;  PSA=0.39;  Testos=307 (350-890) on Testim 2tubes/d;  B12=1005 (211-911) on B12 2024mcg/d...  ~  November 14, 2012:  64mo ROV & Larry Stone has had a good interval w/o new complaints or concerns;  He has gained 9# up to 223# today & we reviewed diet, exercise , & wt reduction strategies...    He remains on Coreg & Amlodipine, uses Lasix just as needed & rarely needs it); BP= 116/74 & he remains asymptomatic...    Followed by [facility] for Cards on Plavix> seen yearly in the fall & stable w/o CP, palpit, etc...    He remaiins on Lip40 along w/ CoQ10, krill oil, Fish oil> FLP 1/14 was great, continue same rx...    He had f/u colonoscopy 5/14 by Larry Stone (done w/ pt on Plavix rx)> mild divertics & 35mm polyp in desc colon removed (tubular adenoma) & repeat rec in 5 yrs...     He continues on Testim using 1 tube daily; +Viagra/Cialis for ED; he notes energy good, feeling well, no issues reported... We reviewed prob list, meds, xrays and labs> see below for updates >>  OK TDAP today...  ~  July 10, 2013:  64mo ROV & Larry Stone has been stable, notes occas HAs but BP has been OK at home, and he is under some stress...     OSA> uses Transderm scope prn dizziness, uses "breathe right nasal strips" periodically; resting OK, no daytime hypersomnolence...    HxBronchitis> on Zyrtek prn; doing satis w/o cough, phlegm, etc...    HBP> on Coreg 12.5Bid, Amlod5, Lasix40 prn; BP= 124/80 & he denies interval CP, palpit, SOB, edema...    CAD, RBBB> on Plavix75; he last saw Larry Stone 11/13> CAD w/ AMI 2004 & residual 50% Lmain; stable, exercising regularly, no angina & no changes made; due for Cards f/u appt...    Hyperlipid> on Lip40, CoQ10; FLP 4/15 shows TChol 133, TG 50, HDL 43, LDL 80    DM> on diet  alone; wt= 223# & BMI=32; Labs 4/15 showed FBS= 115, A1c= 6.0..Marland Kitchen    GI- GERD, Divetrtics, Polyps> on Simethacone for gas; father died of colon cancer age 12; last colonoscopy 5/14 by Larry Stone & showed divertics & 42mm polyp= tub adenoma, repeat 74yrs.    GU- Hypogonad> prev on Testim 2tubes daily, +Viagra&Cialis as needed; labs 4/15 showed PSA=0.39 and he is feeling well, good energy/ libido/ etc...    DJD, LBP> on Osteobiflex, MVI, VitD5000; stable & exercising regularly...    Memory Loss, Anxiety> on Wellbutrin & Alpraz...    ?Hx Vit B12 defic> on B12 2070mcg po daily (dosed on his own); B12 level = 1005 & ok to decr B12 supplement...    Anxiety> on WellbutrinSR 150Bid, Xanax 0,5mg  prn... We reviewed prob list, meds, xrays and labs> see below for updates >> he had the 2014 flu vaccine 11/14...  LABS 4/15:  FLP- at goals on Lip40;  Chems- ok w/ BS=115, A1c=6.0, Cr=1.2;  CBC- wnl;  TSH=1.55;  PSA=0.39...   ~  December 13, 2013:  64mo ROV & Larry Stone has established w/ [facility] Stone [facility], seen 5/15 & note reviewed...  He requested Pulm f/u visit due to concern over several recurrent bronchitic infections over the last few months- treated w/ ZPaks and improved but he & wife are concerned as to why he is getting these; he uses OTC Zyrtek10mg /d, he is an ex-smoker (1ppd for 77yrs, quit 2004), hx tiny 63mm nodule in RML on prior CT Chest along w/ mild centrilob emphysema & no changes serially- last CXR 1/14 w/ norm heart size, clear lungs x mild bronchitic change, and chr right posterolat rib deformity... He is c/o several bouts of bronchitis w/ cough, beige sput, denies f/c/s, but feels tight/ congested/ occas wheezing & SOB, no CP;  I cannot find prev PFTs and we discussed f/u CXR, PFT, and Labs to check for the unlikely presence of an immune defic syndrome... We reviewed prob list, meds, xrays and labs> see below for updates >>   CXR 8/15 showed normal heart size, clear lungs w/o infiltrates, old healed  right rib fxs, NAD...   PFT 8/15 showed FVC=2.32 (50%), FEV1=1.63 (45%), %1sec=70, mid-flows=33% predicted; suggestive of GOLD Stage 3 COPD but can't r/o superimposed restriction w/o lung volume measurement...  LABS 8/15:  SPE/IEP w/o monoclonal gammopathy; Quant Ig's showed normal IgG, normal IgA, low IgM at 26 (40-250), and a hi IgE at 1362 => we will follow w/ RAST panel & consider Xolair Rx...  RAST Panel 9/15 => pending PLAN>>  We discussed starting regular dosing of an inhaled ICS/LABA combination=> try DULERA100- 2spBid; ROV in 3 mo &  we will plan Full PFTs at that time...  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  ~  March 18, 2014:  78mo ROV & f/u dyspnea> Larry Stone states that the [facility] was $400 at the Pharm & they/he didn't call for replacement inhaler, states he filled it one time & ran out several wks ago, we discussed what to do in that eventuality (call us to let us know & get a different Rx), we will try SYMBICORT160-2spBid & he will let us know what his [employer] co-pay is for this med;  Similarly he did not return to get the RAST tests done, but he informs me that they were drawn several days ago w/ blood work for [employer]- results pending...  We reviewed his prev Hx/ symptoms, exam (chest is clear today), and evaluation; all questions answered, we decided to try the Symbicort160 regularly over the next 57mo & plan f/u Full-PFTs in the future;  In the meanwhile we will await the results of the RAST tests and decide regarding Xolair vs Allergy evaluation...    We reviewed prob list, meds, xrays and labs> see below for updates >> He was given the 2015 flu vaccine recently and the Prevnar-13 vaccination as well...  LABS 11/15:  FLP- at goals on Lip40;  Chems- wnl w/ BS=105, A1c=6.1;  CBC-  wnl w/ Hg=15.1;  TSH=1.77, PSA=0.50...   RAST Test 11/15:  pending ADDENDUM>> RAST tests returned w/ IgE level = 1497 and Allergen panel pos for some molds, grasses, trees, ragweed, cats>dogs; and Food panel pos for Milk> Wheat> Shrimp/ Tomato/ Peanut, etc... We discussed avoidance strategies and other options- he is interested in Xolair & we will proceed w/ the paperwork for Xolair approval...   ~  October 15, 2014:  43mo ROV & Larry Stone is still awaiting insurance approval for Xolair!  He reports removing 2 ticks from his privates recently!  Weight down 12# on diet and he notes some improvement in his dyspnea- now just noted w/ intense activ he says eg- climbing/ stairs/ etc but walking is ok...     OSA> s/p UPPP surg 2004; uses Transderm scope prn dizziness, uses "breathe right nasal strips" periodically; resting OK, no daytime hypersomnolence...    COPD & HxBronchitis> on Symbicort160-2Bid & Zyrtek prn; doing satis w/o cough, phlegm, etc...    Elev IgE at 1362 & 1497; see RAST results above;  We are awaiting approval for Xolair...    His last Cards check was 12/25 w/ Larry Stone> Hx MI in 2004 w/ resid 50% Lmain; had CardiacCT 2011 w/ 50% LAD, Myoview 2015 w/ inferolat scar, no ischemia, EF=52%; clinically stable & not c/o CP & no changes made...    He had medical f/u Larry Stone 5/16> HBP, CAD, HL, anxiety- all stable on meds and no changes made...    He continues to f/u w/ Podiatry- DrRegal> seen 4/16 for heel pain, plantar faciitis- injected & given brace + oral anti-inflamm rx... We reviewed prob list, meds, xrays and labs> see below for updates >>             Problem List:     ALLERGIC to ASA w/ hives, & ACE inhibitors/ ARBs too w/ angioedema.  OBSTRUCTIVE SLEEP APNEA (ICD-327.23) - s/p UPPP surgery in 2004 by DrRedman.Marland KitchenMarland Kitchen  states he's doing OK, denies snoring, no daytime hypersom, wife not complaining... uses ZYRTEK for allergy symtoms. ~  1/13: He has started using the "breathe right nasal strips"  qhs for recurrent snoring; still says he rests well, denies daytime hypersomnolence, & declines offer for repeat sleep study to check... ~  7/14:  He reports resting well, no daytime symptoms or sleep issues...  ~  8/15:  He denies sleep disordered breathing... ~  6/16:  He remains asymptomatic- no snoring, daytime hypersomnolence, etc...  Hx of BRONCHITIS, RECURRENT (ICD-491.9) - he is an ex-smoker, prev 1 ppd for 20yrs, quit in 2004. COPD, mixed type- w/ mild centrilobular emphysema on CT Chest 10/11, & chronic bronchitis clinically w/ recurrent infections... ~  Hx tiny RML nodule seen on Cardiac CT 2/11 & apparently unchanged from old [place] 2004... ~  10/11: f/u CT Chest for f/u tiny 78mm nodule RML- unchanged xyrs & benign, mild centilob emphysema, +coronary calcif, NAD... ~  1/13: he denies breathing problems, intercurrent infections, etc... ~  1/14:  CXR 1/14 showed normal heart size, clear lungs, chr right rib deformity (old fx), NAD.  ~  8/15: he was concerned about several recent bouts of bronchitis requiring ZPak for Rx> we checked labs and Immunoglobulins =>   Decided to start inhaler w/ DULERA100-2spBid...  ~  CXR 8/15 showed normal heart size, clear lungs w/o infiltrates, old healed right rib fxs, NAD. ~  PFT 8/15 showed FVC=2.32 (50%), FEV1=1.63 (45%), %1sec=70, mid-flows=33% predicted; suggestive of GOLD Stage 3 COPD but can't r/o superimposed restriction w/o lung volume measurement. ~  LABS 8/15 showed SPE/IEP w/o monoclonal gammopathy; Quant Ig's showed normal IgG, normal IgA, low IgM at 26 (40-250), and a hi IgE at 1362 => we will follow w/ RAST panel & consider Xolair Rx. ~  11/15: he didn't fill the [facility] due to cost- we decided to try SYMBICORT160-2spBid... ~  LABS 11/15 showed RAST Panel (pending)    MEDICAL PROBLEMS PER Larry Stone >>   HYPERTENSION (ICD-401.9) - on COREG 12.5mg Bid, AMLODIPINE 5mg /d, & LASIX 40mg - only as needed (& he hasn't needed any); BP= 116/72 & he  denies CP, palpit, ch in SOB, edema...  ARTERIOSCLEROTIC HEART DISEASE (ICD-414.00) - on PLAVIX 75mg /d, allergic to ASA... followed by [facility] and seen 6/15 (note reviewed)... 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 ~  f/u Cardiac CT 2/11 showed>  Calcified plaque in the left main with < 50% stenosis.  Calcified plaque in the proximal and mid LAD with at most moderate (around 50%) stenosis in the mid LAD. ~  Vasc Screen per Cards 10/11 showed mild carotid plaques, norm AbdAo & ABIs...  HYPERLIPIDEMIA (ICD-272.4) - on LIPITOR 40mg /d, & CoQ10... Last FLP 11/15 on Lip40 showed TChol 153, TG 42, HDL 53, LDL 92  DIABETES MELLITUS, BORDERLINE (ICD-790.29) - on diet alone... Labs 11/15- weight= 224#, BS=105, A1c=6.1  GERD (ICD-530.81) - he uses H2 blockers as needed.  DIVERTICULOSIS OF COLON (ICD-562.10) & COLONIC POLYPS (ICD-211.3) - his last polyps were ~50mm and removed in 2004= adenomatous... there is a +fam hx of colon cancer in his father who died at age 46... ~  Last colonoscopy 5/14 by Larry Stone (done w/ pt on Plavix rx)> mild divertics & 37mm polyp in desc colon removed (tubular adenoma) & repeat rec in 5 yrs.   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 ~  3/15: he is off the prev  Testos replacement Rx> states he feels well, good energy, PSA=0.39  DEGENERATIVE JOINT DISEASE (ICD-715.90) - he reports doing fair- mostly c/o knees & hands ("I messed up my knee in a fall")... he saw Ortho ?who? on Glucosamine/ Chondroitin supplements.  LOW BACK PAIN SYNDROME (ICD-724.2)  MEMORY LOSS >>  ~  1/13: he states that his "memory is fuzzy" & "Clarity is missing"; occas he will swear that he said something but he really didn't; he's had prev scans etc & I have recommended a Neurology eval for his memory but he declines at this time "I'll think about it" he said...  ANXIETY (ICD-300.00) - on WELLBUTRIN 150mg Bid, &  ALPRAZOLAM 0.5mg  Prn... increased stress w/ mother's stroke last year... he's the main care giver w/ 2 sisters who Larry Stone't help much...   Health Maintenance: ~  Immuniztions:  he is encouraged to get the yearly Flu vaccine... given PNEUMOVAX 2/12 & PREVNAR-13 given 11/15... He reports a lot of travel & he's been to the travel clinic & had "every vaccine known to man"> TDAP 7/14 and Zoster 5/14...   Past Surgical History  Procedure Laterality Date  . Uvulopalatopharyngoplasty      surgery for OSA  . Cardiac catheterization      Outpatient Encounter Prescriptions as of 10/15/2014  Medication Sig  . ALPRAZolam (XANAX) 0.5 MG tablet TAKE 1 TABLET BY MOUTH 3 TIMES A DAY AS NEEDED  . amLODipine (NORVASC) 5 MG tablet TAKE 1 TABLET BY MOUTH DAILY  . ammonium lactate (LAC-HYDRIN) 12 % lotion Apply topically as directed.  Marland Kitchen atorvastatin (LIPITOR) 40 MG tablet TAKE 1 TABLET BY MOUTH EVERY DAY  . b complex vitamins tablet Take 1 tablet by mouth daily.  . budesonide-formoterol (SYMBICORT) 160-4.5 MCG/ACT inhaler Inhale 2 puffs into the lungs 2 (two) times daily.  Marland Kitchen buPROPion (WELLBUTRIN SR) 150 MG 12 hr tablet TAKE 1 TABLET BY MOUTH TWICE A DAY  . carvedilol (COREG) 12.5 MG tablet TAKE 1 TABLET BY MOUTH TWICE A DAY WITH A MEAL  . cetirizine (ZYRTEC) 10 MG tablet Take 1 tablet (10 mg total) by mouth daily.  . Cholecalciferol (VITAMIN D3) 5000 UNITS CAPS Take one tablet by mouth daily  . clopidogrel (PLAVIX) 75 MG tablet TAKE 1 TABLET BY MOUTH DAILY  . clotrimazole-betamethasone (LOTRISONE) cream APPLY AS DIRECTED  . Coenzyme Q10 (COQ10) 100 MG CAPS Take as directed  . cyanocobalamin 2000 MCG tablet Take 1 tablet (2,000 mcg total) by mouth daily.  . furosemide (LASIX) 40 MG tablet TAKE 1 TABLET BY MOUTH DAILY (Patient taking differently: as needed)  . nitroGLYCERIN (NITROSTAT) 0.4 MG SL tablet Place 1 tablet (0.4 mg total) under the tongue every 5 (five) minutes as needed. For up to 3 doses.  .  Simethicone (GAS-X EXTRA STRENGTH) 125 MG CAPS Prn  . VIAGRA 100 MG tablet TAKE 1 TABLET BY MOUTH AS NEEDED FOR ERECTILE DYSFUNCTION.   Facility-Administered Encounter Medications as of 10/15/2014  Medication  . triamcinolone acetonide (KENALOG) 10 MG/ML injection 10 mg    Allergies  Allergen Reactions  . Aspirin     REACTION: allergic to ASA w/ hives...  . Codeine   . Irbesartan     REACTION: allergic to ARBs w/ angioedema  . Ramipril     REACTION: Allergic to ACE's w/ angioedema...    Current Medications, Allergies, Past Medical History, Past Surgical History, Family History, and Social History were reviewed in [employer] record.    Review of Systems  See HPI - all other systems neg except as noted...       The patient complains of dyspnea on exertion and muscle weakness.  The patient denies anorexia, fever, weight loss, weight gain, vision loss, decreased hearing, hoarseness, chest pain, syncope, peripheral edema, prolonged cough, headaches, hemoptysis, abdominal pain, melena, hematochezia, severe indigestion/heartburn, hematuria, incontinence, suspicious skin lesions, transient blindness, difficulty walking, depression, unusual weight change, abnormal bleeding, enlarged lymph nodes, and angioedema.     Objective:   Physical Exam    WD, sl overweight, 64 y/o WM in NAD... GENERAL:  Alert & oriented; pleasant & cooperative... HEENT:  [place]/AT, EOM-wnl, PERRLA, EACs-clear, TMs-wnl, NOSE-clear, THROAT-s/p UPPP surg... NECK:  Supple w/ fairROM; no JVD; normal carotid impulses w/o bruits; no thyromegaly or nodules palpated; no lymphadenopathy. CHEST:  Clear to P & A; without wheezes/ rales/ or rhonchi. HEART:  Regular Rhythm; without murmurs/ rubs/ or gallops. ABDOMEN:  Soft & nontender; normal bowel sounds; no organomegaly or masses detected. EXT: without deformities, mild arthritic changes; no varicose veins/ venous insuffic/ or edema. Neuro:  intact w/o  focal abn detected... DERM:  No lesions noted; no rash etc...  RADIOLOGY DATA:  Reviewed in the EPIC EMR & discussed w/ the patient...   LABORATORY DATA:  Reviewed in the EPIC EMR & discussed w/ the patient...     Assessment & Plan:    Hx OSA, s/p UPPP in 2004>  Prev noted recurrent snoring & using breathe right nasal strips; he didn't want repeat sleep study or further eval, just want Rx for the strips so he can pay for them w/ his flex acct...   Hx recurrent bronchitis, underlying COPD (mixed type)>  He quit smoking in 2004 & c/o recurrent bronchitic exacerbations=> PFT shows mod obstructive dis & we tried DULERA but too $$$ therefore switch to SYMBICORT160-2spBid regularly w/ f/u PFTs later...  Medical issues per Larry Stone>  HBP, CAD s/pMI, VI/ edema, HL, IFG, GERD/ Divertics/ Polyps, GU/ Low-T, DJD/ LBP, Anxiety...    Patient's Medications  New Prescriptions   No medications on file  Previous Medications   ALPRAZOLAM (XANAX) 0.5 MG TABLET    TAKE 1 TABLET BY MOUTH 3 TIMES A DAY AS NEEDED   AMLODIPINE (NORVASC) 5 MG TABLET    TAKE 1 TABLET BY MOUTH DAILY   AMMONIUM LACTATE (LAC-HYDRIN) 12 % LOTION    Apply topically as directed.   ATORVASTATIN (LIPITOR) 40 MG TABLET    TAKE 1 TABLET BY MOUTH EVERY DAY   B COMPLEX VITAMINS TABLET    Take 1 tablet by mouth daily.   BUDESONIDE-FORMOTEROL (SYMBICORT) 160-4.5 MCG/ACT INHALER    Inhale 2 puffs into the lungs 2 (two) times daily.   BUPROPION (WELLBUTRIN SR) 150 MG 12 HR TABLET    TAKE 1 TABLET BY MOUTH TWICE A DAY   CARVEDILOL (COREG) 12.5 MG TABLET    TAKE 1 TABLET BY MOUTH TWICE A DAY WITH A MEAL   CETIRIZINE (ZYRTEC) 10 MG TABLET    Take 1 tablet (10 mg total) by mouth daily.   CHOLECALCIFEROL (VITAMIN D3) 5000 UNITS CAPS    Take one tablet by mouth daily   CLOPIDOGREL (PLAVIX) 75 MG TABLET    TAKE 1 TABLET BY MOUTH DAILY   CLOTRIMAZOLE-BETAMETHASONE (LOTRISONE) CREAM    APPLY AS DIRECTED   COENZYME Q10 (COQ10) 100 MG CAPS    Take  as directed   CYANOCOBALAMIN 2000 MCG TABLET    Take 1 tablet (2,000 mcg total) by mouth daily.  FUROSEMIDE (LASIX) 40 MG TABLET    TAKE 1 TABLET BY MOUTH DAILY   NITROGLYCERIN (NITROSTAT) 0.4 MG SL TABLET    Place 1 tablet (0.4 mg total) under the tongue every 5 (five) minutes as needed. For up to 3 doses.   SIMETHICONE (GAS-X EXTRA STRENGTH) 125 MG CAPS    Prn   VIAGRA 100 MG TABLET    TAKE 1 TABLET BY MOUTH AS NEEDED FOR ERECTILE DYSFUNCTION.  Modified Medications   No medications on file  Discontinued Medications   No medications on file

## 2014-10-15 NOTE — Patient Instructions (Signed)
Today we updated your med list in our EPIC system...    Continue your current medications the same...  We are continuing the approval process for XOLAIR injections...    We will keep you informed of the results...  Call for any questions...  Let's plan a follow up visit in 26mo, sooner if needed for breathing problems.Marland KitchenMarland Kitchen

## 2014-10-16 ENCOUNTER — Ambulatory Visit (INDEPENDENT_AMBULATORY_CARE_PROVIDER_SITE_OTHER): Payer: Commercial Managed Care - HMO | Admitting: Cardiovascular Disease

## 2014-10-16 ENCOUNTER — Encounter: Payer: Self-pay | Admitting: Internal Medicine

## 2014-10-16 ENCOUNTER — Encounter: Payer: Self-pay | Admitting: Cardiovascular Disease

## 2014-10-16 VITALS — BP 120/76 | HR 62 | Ht 69.0 in | Wt 209.0 lb

## 2014-10-16 DIAGNOSIS — I10 Essential (primary) hypertension: Secondary | ICD-10-CM | POA: Diagnosis not present

## 2014-10-16 DIAGNOSIS — I2510 Atherosclerotic heart disease of native coronary artery without angina pectoris: Secondary | ICD-10-CM | POA: Diagnosis not present

## 2014-10-16 DIAGNOSIS — E785 Hyperlipidemia, unspecified: Secondary | ICD-10-CM

## 2014-10-16 NOTE — Patient Instructions (Signed)
Medication Instructions:  Your physician recommends that you continue on your current medications as directed. Please refer to the Current Medication list given to you today.   Labwork: none  Testing/Procedures: none  Follow-Up: Your physician wants you to follow-up in: 6 months.  You will receive a reminder letter in the mail two months in advance. If you don't receive a letter, please call our office to schedule the follow-up appointment.       

## 2014-10-16 NOTE — Progress Notes (Signed)
Chief Complaint  Patient presents with  . Follow-up     History of Present Illness: 64 yo male with history of CAD, HLD, HTN, OSA here today for cardiac follow up. He has been followed in the past by Dr. Haroldine Laws. 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 He also has residual 50% left main stenosis and nonobstructive disease elsewhere. Cardiac CT in 05/2009 with LM < 50% LAD 50% LCX dominant nonobs RCA small no critical lesions. Small lung nodule. F/u CT showed stable pulm nodule. Carotid u/s 11/11 with minimal carotid plaque. Exercise treadmill stress test 08/30/13 but did not achieve target heart rate. Lexiscan stress myoview 10/03/13 with inferolateral scar, no ischemia, LVEF=52%.   He is here today for follow up. He is doing well. He has been exercising every day on the treadmill. No CP or SOB.   Primary Care Physician: Teressa Lower  Last Lipid Profile:Lipid Panel     Component Value Date/Time   CHOL 130 10/15/2014 1054   TRIG 53.0 10/15/2014 1054   HDL 47.30 10/15/2014 1054   CHOLHDL 3 10/15/2014 1054   VLDL 10.6 10/15/2014 1054   LDLCALC 72 10/15/2014 1054     Past Medical History  Diagnosis Date  . CAD     --8/09: ETT normal --acute inferior lateral wall infarction in September 2004 treated medically.  --cardiac CT 2006  residual 50% left main stenosis and nonobstructive disease elsewhere. EF normal   --cardiac CT 05/2009. LM. <50% LAD. 50%  . HTN (hypertension)   . Hyperlipidemia   . Pharyngitis   . URI (upper respiratory infection)   . OSA (obstructive sleep apnea)   . Recurrent aspiration bronchitis/pneumonia   . Diabetes mellitus   . GERD (gastroesophageal reflux disease)   . Diverticulosis   . Colonic polyp   . DJD (degenerative joint disease)   . Low back pain syndrome   . Anxiety   . Myocardial infarction 2004    Past Surgical History  Procedure Laterality Date  . Uvulopalatopharyngoplasty      surgery for OSA  . Cardiac  catheterization      Current Outpatient Prescriptions  Medication Sig Dispense Refill  . ALPRAZolam (XANAX) 0.5 MG tablet TAKE 1 TABLET BY MOUTH 3 TIMES A DAY AS NEEDED 90 tablet 1  . amLODipine (NORVASC) 5 MG tablet TAKE 1 TABLET BY MOUTH DAILY 90 tablet 1  . ammonium lactate (LAC-HYDRIN) 12 % lotion Apply topically as directed. 400 g 3  . atorvastatin (LIPITOR) 40 MG tablet TAKE 1 TABLET BY MOUTH EVERY DAY 90 tablet 1  . b complex vitamins tablet Take 1 tablet by mouth daily.    . budesonide-formoterol (SYMBICORT) 160-4.5 MCG/ACT inhaler Inhale 2 puffs into the lungs 2 (two) times daily. 1 Inhaler 6  . buPROPion (WELLBUTRIN SR) 150 MG 12 hr tablet TAKE 1 TABLET BY MOUTH TWICE A DAY 180 tablet 1  . carvedilol (COREG) 12.5 MG tablet TAKE 1 TABLET BY MOUTH TWICE A DAY WITH A MEAL 180 tablet 1  . cetirizine (ZYRTEC) 10 MG tablet Take 1 tablet (10 mg total) by mouth daily. 30 tablet 11  . Cholecalciferol (VITAMIN D3) 5000 UNITS CAPS Take one tablet by mouth daily    . clopidogrel (PLAVIX) 75 MG tablet TAKE 1 TABLET BY MOUTH DAILY 90 tablet 1  . clotrimazole-betamethasone (LOTRISONE) cream APPLY AS DIRECTED 45 g 11  . Coenzyme Q10 (COQ10) 100 MG CAPS Take as directed 30 each 11  .  cyanocobalamin 2000 MCG tablet Take 1 tablet (2,000 mcg total) by mouth daily. 30 tablet 11  . furosemide (LASIX) 40 MG tablet TAKE 1 TABLET BY MOUTH DAILY (Patient taking differently: as needed) 90 tablet 1  . nitroGLYCERIN (NITROSTAT) 0.4 MG SL tablet Place 1 tablet (0.4 mg total) under the tongue every 5 (five) minutes as needed. For up to 3 doses. 25 tablet 6  . Simethicone (GAS-X EXTRA STRENGTH) 125 MG CAPS Prn    . VIAGRA 100 MG tablet TAKE 1 TABLET BY MOUTH AS NEEDED FOR ERECTILE DYSFUNCTION. 15 tablet 2   Current Facility-Administered Medications  Medication Dose Route Frequency Provider Last Rate Last Dose  . triamcinolone acetonide (KENALOG) 10 MG/ML injection 10 mg  10 mg Other Once Wallene Huh, DPM          Allergies  Allergen Reactions  . Aspirin     REACTION: allergic to ASA w/ hives...  . Codeine   . Irbesartan     REACTION: allergic to ARBs w/ angioedema  . Ramipril     REACTION: Allergic to ACE's w/ angioedema...    History   Social History  . Marital Status: Married    Spouse Name: N/A  . Number of Children: 1  . Years of Education: N/A   Occupational History  . [employer] History Main Topics  . Smoking status: Former Smoker -- 1.00 packs/day for 20 years    Types: Cigarettes, Cigars    Quit date: 05/08/1998  . Smokeless tobacco: Never Used     Comment: quit in 2005  . Alcohol Use: 0.6 oz/week    1 Standard drinks or equivalent per week     Comment: social drinker  . Drug Use: No  . Sexual Activity: Yes   Other Topics Concern  . Not on file   Social History Narrative    Family History  Problem Relation Age of Onset  . Colon cancer Father   . Stroke Mother   . Heart disease Mother   . Hypertension Sister   . Diabetes Sister     Review of Systems:  As stated in the HPI and otherwise negative.   BP 120/76 mmHg  Pulse 62  Ht 5\' 9"  (1.753 m)  Wt 209 lb (94.802 kg)  BMI 30.85 kg/m2  Physical Examination: General: Well developed, well nourished, NAD HEENT: OP clear, mucus membranes moist SKIN: warm, dry. No rashes. Neuro: No focal deficits Musculoskeletal: Muscle strength 5/5 all ext Psychiatric: Mood and affect normal Neck: No JVD, no carotid bruits, no thyromegaly, no lymphadenopathy. Lungs:Clear bilaterally, no wheezes, rhonci, crackles Cardiovascular: Regular rate and rhythm. No murmurs, gallops or rubs. Abdomen:Soft. Bowel sounds present. Non-tender.  Extremities: No lower extremity edema. Pulses are 2 + in the bilateral DP/PT.  Echo 08/30/13: Left ventricle: The cavity size was normal. Systolic function was normal. The estimated ejection fraction was in the range of 55% to 60%. Probable mild hypokinesis  of the basal-midinferolateral and inferior myocardium. Doppler parameters are consistent with abnormal left ventricular relaxation (grade 1 diastolic dysfunction). - Mitral valve: Calcified annulus.  Stress myoview 10/03/13: Stress Procedure: The patient received IV Lexiscan 0.4 mg over 15-seconds. Technetium 48m Sestamibi injected at 30-seconds. This patient had sob and an "Uncomfortable Fullness" with the Lexiscan injection. Quantitative spect images were obtained after a 45 minute delay.  Stress ECG: No significant change from baseline ECG  QPS  Raw Data Images: Normal; no motion artifact; normal heart/lung ratio.  Stress Images: There is a large size severe severity perfusion defect in the basal and mid inferior and inferolateral walls.  Rest Images: There is a large size severe severity perfusion defect in the basal and mid inferior and inferolateral walls.  Subtraction (SDS): SDS 2  Transient Ischemic Dilatation (Normal <1.22): 1.06  Lung/Heart Ratio (Normal <0.45): 0.32  Quantitative Gated Spect Images  QGS EDV: 135 ml  QGS ESV: 65 ml  Impression  Exercise Capacity: Lexiscan with no exercise.  BP Response: Normal blood pressure response.  Clinical Symptoms: No significant symptoms noted.  ECG Impression: No significant ST segment change suggestive of ischemia.  Comparison with Prior Nuclear Study: No significant change from previous study  Overall Impression: Intermediate risk stress nuclear study with a large infarct in the basal and mid inferior and inferolateral walls with mild periinfarct ischemia in the apical inferior wall (SDS 2)..  LV Ejection Fraction: 52%. LV Wall Motion: akinesis of the basal and mid inferior and inferolateral walls.   EKG:  EKG is ordered today. The ekg ordered today demonstrates Sinus, rate 62 bpm. 1st degree AV block. RBBB. Old infeior Q-waves.   Recent Labs: 03/05/2014: Hemoglobin 15.1; Platelets 168.0; TSH 1.77 10/15/2014: ALT 19; BUN 17;  Creatinine, Ser 1.00; Potassium 4.5; Sodium 139   Lipid Panel    Component Value Date/Time   CHOL 130 10/15/2014 1054   TRIG 53.0 10/15/2014 1054   HDL 47.30 10/15/2014 1054   CHOLHDL 3 10/15/2014 1054   VLDL 10.6 10/15/2014 1054   LDLCALC 72 10/15/2014 1054     Wt Readings from Last 3 Encounters:  10/16/14 209 lb (94.802 kg)  10/15/14 211 lb 12.8 oz (96.072 kg)  09/03/14 219 lb (99.338 kg)     Other studies Reviewed: Additional studies/ records that were reviewed today include: . Review of the above records demonstrates:    Assessment and Plan:  1. CAD: Stable. Stress test 10/03/13 shows inferolateral scar to be expected after his cardiac event in 2004 with occluded OM branch. No other ischemia. LV function normal.  Continue current regimen.   2. HYPERTENSION:  Blood pressure well controlled. Continue current regimen.   3. HYPERLIPIDEMIA: LDL is at goal. Continue statin.   Current medicines are reviewed at length with the patient today.  The patient does not have concerns regarding medicines.  The following changes have been made:  no change  Labs/ tests ordered today include:  No orders of the defined types were placed in this encounter.    Disposition:   FU with me in 6 months  Signed, Lauree Chandler, MD 10/16/2014 3:28 PM    [facility] [place], [place], [place]  [address] Phone: [phone]; Fax: [phone]

## 2014-11-07 ENCOUNTER — Encounter: Payer: Self-pay | Admitting: Gastroenterology

## 2015-01-15 ENCOUNTER — Other Ambulatory Visit: Payer: Self-pay | Admitting: Pulmonary Disease

## 2015-01-16 ENCOUNTER — Ambulatory Visit (INDEPENDENT_AMBULATORY_CARE_PROVIDER_SITE_OTHER): Payer: Commercial Managed Care - HMO

## 2015-01-16 DIAGNOSIS — Z23 Encounter for immunization: Secondary | ICD-10-CM

## 2015-01-19 ENCOUNTER — Other Ambulatory Visit: Payer: Self-pay | Admitting: Cardiovascular Disease

## 2015-02-01 ENCOUNTER — Other Ambulatory Visit: Payer: Self-pay | Admitting: Internal Medicine

## 2015-02-02 ENCOUNTER — Other Ambulatory Visit: Payer: Self-pay | Admitting: Cardiovascular Disease

## 2015-02-03 NOTE — Telephone Encounter (Signed)
[facility].../lmb 

## 2015-02-04 NOTE — Telephone Encounter (Signed)
Per last office visit patient only takes this prn. Ok to send in for one tablet daily as needed? Please advise. Thanks, MI

## 2015-02-04 NOTE — Telephone Encounter (Signed)
Ok to refill. Thanks, chris

## 2015-03-05 ENCOUNTER — Other Ambulatory Visit: Payer: Self-pay | Admitting: Internal Medicine

## 2015-03-10 ENCOUNTER — Telehealth: Payer: Self-pay | Admitting: Pulmonary Disease

## 2015-03-10 MED ORDER — PREDNISONE 10 MG (21) PO TBPK: 10.0000 mg | ORAL_TABLET | Freq: Every day | ORAL | Status: DC

## 2015-03-10 NOTE — Telephone Encounter (Signed)
Per SN>> Call in prednisone 10mg  6 day dose pack with instructions to take as directed. Advise pt to call office back if not better  Called and spoke with pt and informed of SN rec and med instructions Pt voiced understanding  Medication sent electronically to pt's pharmacy  Nothing further is needed

## 2015-03-10 NOTE — Telephone Encounter (Signed)
Called spoke with pt. He c/o increase SOB, wheezing, chest tx, very little cough if any at all now. No f/c/n/v/s. He used his albuterol neb twice today and can't tell if it has helped. Pt wants something called in. Please advise Dr. Lenna Gilford thanks  Allergies  Allergen Reactions  . Aspirin     REACTION: allergic to ASA w/ hives...  . Codeine   . Irbesartan     REACTION: allergic to ARBs w/ angioedema  . Ramipril     REACTION: Allergic to ACE's w/ angioedema...     Current Outpatient Prescriptions on File Prior to Visit  Medication Sig Dispense Refill  . ALPRAZolam (XANAX) 0.5 MG tablet TAKE 1 TABLET BY MOUTH 3 TIMES A DAY AS NEEDED 90 tablet 2  . amLODipine (NORVASC) 5 MG tablet TAKE 1 TABLET BY MOUTH EVERY DAY 90 tablet 0  . ammonium lactate (LAC-HYDRIN) 12 % lotion Apply topically as directed. 400 g 3  . atorvastatin (LIPITOR) 40 MG tablet TAKE 1 TABLET BY MOUTH EVERY DAY 90 tablet 0  . b complex vitamins tablet Take 1 tablet by mouth daily.    . budesonide-formoterol (SYMBICORT) 160-4.5 MCG/ACT inhaler Inhale 2 puffs into the lungs 2 (two) times daily. 1 Inhaler 6  . buPROPion (WELLBUTRIN SR) 150 MG 12 hr tablet TAKE 1 TABLET BY MOUTH TWICE A DAY 180 tablet 0  . carvedilol (COREG) 12.5 MG tablet TAKE 1 TABLET BY MOUTH TWICE A DAY WITH A MEAL 180 tablet 0  . cetirizine (ZYRTEC) 10 MG tablet Take 1 tablet (10 mg total) by mouth daily. 30 tablet 11  . Cholecalciferol (VITAMIN D3) 5000 UNITS CAPS Take one tablet by mouth daily    . clopidogrel (PLAVIX) 75 MG tablet TAKE 1 TABLET BY MOUTH DAILY 90 tablet 0  . clotrimazole-betamethasone (LOTRISONE) cream APPLY AS DIRECTED 45 g 11  . Coenzyme Q10 (COQ10) 100 MG CAPS Take as directed 30 each 11  . cyanocobalamin 2000 MCG tablet Take 1 tablet (2,000 mcg total) by mouth daily. 30 tablet 11  . furosemide (LASIX) 40 MG tablet Take 1 tablet (40 mg total) by mouth daily as needed. 90 tablet 3  . nitroGLYCERIN (NITROSTAT) 0.4 MG SL tablet Place 1  tablet (0.4 mg total) under the tongue every 5 (five) minutes as needed. For up to 3 doses. 25 tablet 6  . Simethicone (GAS-X EXTRA STRENGTH) 125 MG CAPS Prn    . VIAGRA 100 MG tablet TAKE 1 TABLET BY MOUTH AS NEEDED FOR ERECTILE DYSFUNCTION. 15 tablet 2   Current Facility-Administered Medications on File Prior to Visit  Medication Dose Route Frequency Provider Last Rate Last Dose  . triamcinolone acetonide (KENALOG) 10 MG/ML injection 10 mg  10 mg Other Once Wallene Huh, DPM

## 2015-03-12 ENCOUNTER — Telehealth: Payer: Self-pay | Admitting: Pulmonary Disease

## 2015-03-12 NOTE — Telephone Encounter (Signed)
Called and spoke with pharmacist Pharmacist wanted to check that pt was suppose to be on taper dose of prednisone Pt thought that he was suppose to take prednisone q daily Informed pharmacist that pt is suppose to be on taper dose  Nothing further is needed

## 2015-03-18 ENCOUNTER — Other Ambulatory Visit: Payer: Self-pay | Admitting: Internal Medicine

## 2015-03-18 NOTE — Telephone Encounter (Signed)
Pt called stating the pharmacy hasn't received the prescription. Can you please resend it. [facility] [address]

## 2015-03-19 NOTE — Telephone Encounter (Signed)
[facility]ed [facility] spoke with pharmacist Larry Stone verify if they received call back on refill from 11/16. Per Larry Stone did not get refill on alprazolam. Gave md authorization from 11/16. Notified pt alprazolam has been call to pharmacy...Larry Stone

## 2015-03-20 NOTE — Telephone Encounter (Signed)
Done

## 2015-04-03 ENCOUNTER — Telehealth: Payer: Self-pay | Admitting: Pulmonary Disease

## 2015-04-03 NOTE — Telephone Encounter (Signed)
Pt scheduled for OV with SN 04/04/15 per SN. Nothing further needed.

## 2015-04-03 NOTE — Telephone Encounter (Signed)
Per SN- Needs OV, ok to work in tomorrow. Thanks.

## 2015-04-03 NOTE — Telephone Encounter (Signed)
Called spoke with pt. He c/o having a constant hacking cough that is keeping him up all night. Sob unchanged. C/o wheezing upon exertion. Pt was given a pred taper 03/10/15. Please advise SN thanks  Allergies  Allergen Reactions  . Aspirin     REACTION: allergic to ASA w/ hives...  . Codeine   . Irbesartan     REACTION: allergic to ARBs w/ angioedema  . Ramipril     REACTION: Allergic to ACE's w/ angioedema...     Current Outpatient Prescriptions on File Prior to Visit  Medication Sig Dispense Refill  . ALPRAZolam (XANAX) 0.5 MG tablet TAKE 1 TABLET BY MOUTH 3 TIMES A DAY AS NEEDED 90 tablet 3  . ALPRAZolam (XANAX) 0.5 MG tablet TAKE 1 TABLET BY MOUTH 3 TIMES A DAY AS NEEDED 90 tablet 2  . amLODipine (NORVASC) 5 MG tablet TAKE 1 TABLET BY MOUTH EVERY DAY 90 tablet 0  . ammonium lactate (LAC-HYDRIN) 12 % lotion Apply topically as directed. 400 g 3  . atorvastatin (LIPITOR) 40 MG tablet TAKE 1 TABLET BY MOUTH EVERY DAY 90 tablet 0  . b complex vitamins tablet Take 1 tablet by mouth daily.    . budesonide-formoterol (SYMBICORT) 160-4.5 MCG/ACT inhaler Inhale 2 puffs into the lungs 2 (two) times daily. 1 Inhaler 6  . buPROPion (WELLBUTRIN SR) 150 MG 12 hr tablet TAKE 1 TABLET BY MOUTH TWICE A DAY 180 tablet 0  . carvedilol (COREG) 12.5 MG tablet TAKE 1 TABLET BY MOUTH TWICE A DAY WITH A MEAL 180 tablet 0  . cetirizine (ZYRTEC) 10 MG tablet Take 1 tablet (10 mg total) by mouth daily. 30 tablet 11  . Cholecalciferol (VITAMIN D3) 5000 UNITS CAPS Take one tablet by mouth daily    . clopidogrel (PLAVIX) 75 MG tablet TAKE 1 TABLET BY MOUTH DAILY 90 tablet 0  . clotrimazole-betamethasone (LOTRISONE) cream APPLY AS DIRECTED 45 g 11  . Coenzyme Q10 (COQ10) 100 MG CAPS Take as directed 30 each 11  . cyanocobalamin 2000 MCG tablet Take 1 tablet (2,000 mcg total) by mouth daily. 30 tablet 11  . furosemide (LASIX) 40 MG tablet Take 1 tablet (40 mg total) by mouth daily as needed. 90 tablet 3  .  nitroGLYCERIN (NITROSTAT) 0.4 MG SL tablet Place 1 tablet (0.4 mg total) under the tongue every 5 (five) minutes as needed. For up to 3 doses. 25 tablet 6  . predniSONE (STERAPRED UNI-PAK 21 TAB) 10 MG (21) TBPK tablet Take 1 tablet (10 mg total) by mouth daily. Use as directed 14 tablet 0  . Simethicone (GAS-X EXTRA STRENGTH) 125 MG CAPS Prn    . VIAGRA 100 MG tablet TAKE 1 TABLET BY MOUTH AS NEEDED FOR ERECTILE DYSFUNCTION. 15 tablet 2   Current Facility-Administered Medications on File Prior to Visit  Medication Dose Route Frequency Provider Last Rate Last Dose  . triamcinolone acetonide (KENALOG) 10 MG/ML injection 10 mg  10 mg Other Once Wallene Huh, DPM

## 2015-04-04 ENCOUNTER — Ambulatory Visit (INDEPENDENT_AMBULATORY_CARE_PROVIDER_SITE_OTHER): Payer: Commercial Managed Care - HMO | Admitting: Pulmonary Disease

## 2015-04-04 ENCOUNTER — Encounter: Payer: Self-pay | Admitting: Pulmonary Disease

## 2015-04-04 ENCOUNTER — Ambulatory Visit (INDEPENDENT_AMBULATORY_CARE_PROVIDER_SITE_OTHER)
Admission: RE | Admit: 2015-04-04 | Discharge: 2015-04-04 | Disposition: A | Discharge status: Home / Self Care | Payer: Commercial Managed Care - HMO | Source: Ambulatory Visit | Attending: Pulmonary Disease | Admitting: Pulmonary Disease

## 2015-04-04 VITALS — BP 116/64 | HR 64 | Temp 98.1°F | Wt 214.8 lb

## 2015-04-04 DIAGNOSIS — I10 Essential (primary) hypertension: Secondary | ICD-10-CM

## 2015-04-04 DIAGNOSIS — I2510 Atherosclerotic heart disease of native coronary artery without angina pectoris: Secondary | ICD-10-CM | POA: Diagnosis not present

## 2015-04-04 DIAGNOSIS — J449 Chronic obstructive pulmonary disease, unspecified: Secondary | ICD-10-CM

## 2015-04-04 DIAGNOSIS — J441 Chronic obstructive pulmonary disease with (acute) exacerbation: Secondary | ICD-10-CM | POA: Diagnosis not present

## 2015-04-04 DIAGNOSIS — K219 Gastro-esophageal reflux disease without esophagitis: Secondary | ICD-10-CM

## 2015-04-04 DIAGNOSIS — R760 Raised antibody titer: Secondary | ICD-10-CM

## 2015-04-04 DIAGNOSIS — R768 Other specified abnormal immunological findings in serum: Secondary | ICD-10-CM

## 2015-04-04 MED ORDER — UMECLIDINIUM BROMIDE 62.5 MCG/INH IN AEPB: 1.0000 | INHALATION_SPRAY | Freq: Every day | RESPIRATORY_TRACT | Status: DC

## 2015-04-04 MED ORDER — METHYLPREDNISOLONE ACETATE 80 MG/ML IJ SUSP
80.0000 mg | Freq: Once | INTRAMUSCULAR | Status: AC
  Administered 2015-04-04: 80 mg via INTRAMUSCULAR

## 2015-04-04 MED ORDER — UMECLIDINIUM-VILANTEROL 62.5-25 MCG/INH IN AEPB: 1.0000 | INHALATION_SPRAY | Freq: Every day | RESPIRATORY_TRACT | Status: DC

## 2015-04-04 MED ORDER — METHYLPREDNISOLONE 8 MG PO TABS: ORAL_TABLET | ORAL | Status: DC

## 2015-04-04 MED ORDER — AZITHROMYCIN 250 MG PO TABS: ORAL_TABLET | ORAL | Status: DC

## 2015-04-04 MED ORDER — HYDROCODONE-HOMATROPINE 5-1.5 MG/5ML PO SYRP: 5.0000 mL | ORAL_SOLUTION | Freq: Four times a day (QID) | ORAL | Status: DC | PRN

## 2015-04-04 NOTE — Progress Notes (Signed)
Subjective:    Patient ID: Larry Stone, male    DOB: [birth year], 64 y.o.   MRN: [id number]  HPI 64 y/o WM here for a followup visit... he has multiple medical problems as noted below...  he is followed by Larry Stone for Cards- Hx HBP, RBBB, CAD, s/p IWMI 9/04, known residual <50%Lmain stenosis & nonobstructive dis elsewhere on Cardiac CT 2/11... and Larry Stone for [facility]- Chol, Impaired fasting glucose, etc... ~  SEE PREV EPIC NOTES FOR OLDER DATA >>    CXR 1/14 showed normal heart size, clear lungs, chr right rib deformity (old fx), NAD...  LABS 1/14:  FLP- at goals on Lip40+Niasp;  Chems- wnl;  CBC- wnl;  TSH=1.93;  PSA=0.39;  Testos=307 (350-890) on Testim 2tubes/d;  B12=1005 (211-911) on B12 2050mcg/d...  LABS 4/15:  FLP- at goals on Lip40;  Chems- ok w/ BS=115, A1c=6.0, Cr=1.2;  CBC- wnl;  TSH=1.55;  PSA=0.39...   ~  December 13, 2013:  105mo ROV & Larry Stone has established w/ Larry Stone for [facility], seen 5/15 & note reviewed...  He requested Pulm f/u visit due to concern over several recurrent bronchitic infections over the last few months- treated w/ ZPaks and improved but he & wife are concerned as to why he is getting these; he uses OTC Zyrtek10mg /d, he is an ex-smoker (1ppd for 76yrs, quit 2004), hx tiny 42mm nodule in RML on prior CT Chest along w/ mild centrilob emphysema & no changes serially- last CXR 1/14 w/ norm heart size, clear lungs x mild bronchitic change, and chr right posterolat rib deformity... He is c/o several bouts of bronchitis w/ cough, beige sput, denies f/c/s, but feels tight/ congested/ occas wheezing & SOB, no CP;  I cannot find prev PFTs and we discussed f/u CXR, PFT, and Labs to check for the unlikely presence of an immune defic syndrome... We reviewed prob list, meds, xrays and labs> see below for updates >>   CXR 8/15 showed normal heart size, clear lungs w/o infiltrates, old healed right rib fxs, NAD...   PFT 8/15 showed FVC=2.32 (50%), FEV1=1.63 (45%),  %1sec=70, mid-flows=33% predicted; suggestive of GOLD Stage 3 COPD but can't r/o superimposed restriction w/o lung volume measurement...  LABS 8/15:  SPE/IEP w/o monoclonal gammopathy; Quant Ig's showed normal IgG, normal IgA, low IgM at 26 (40-250), and a hi IgE at 1362 => we will follow w/ RAST panel & consider Xolair Rx...  RAST Panel 9/15 => see below... PLAN>>  We discussed starting regular dosing of an inhaled ICS/LABA combination=> try DULERA100- 2spBid; ROV in 3 mo & we will plan Full PFTs at that time...  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  ~  March 18, 2014:  64mo ROV & f/u dyspnea> Larry Stone states that the [facility] was $400 at the Pharm & they/he didn't call for replacement inhaler, states he filled it one time & ran out several wks ago, we discussed what to do in that eventuality (call us to let us know & get a different Rx), we will try SYMBICORT160-2spBid & he will let us know what his [employer] co-pay is for this med;  Similarly he did not return to get the RAST tests done, but he informs me that they were drawn several days ago w/ blood  work for [employer]- results pending...  We reviewed his prev Hx/ symptoms, exam (chest is clear today), and evaluation; all questions answered, we decided to try the Symbicort160 regularly over the next 28mo & plan f/u Full-PFTs in the future;  In the meanwhile we will await the results of the RAST tests and decide regarding Xolair vs Allergy evaluation...    We reviewed prob list, meds, xrays and labs> see below for updates >> He was given the 2015 flu vaccine recently and the Prevnar-13 vaccination as well...  LABS 11/15:  FLP- at goals on Lip40;  Chems- wnl w/ BS=105, A1c=6.1;  CBC- wnl w/ Hg=15.1;  TSH=1.77, PSA=0.50...   RAST Test 9/15:  returned w/  IgE level = 1497 and Allergen panel pos for some molds, grasses, trees, ragweed, cats>dogs; and Food panel pos for Milk> Wheat> Shrimp/ Tomato/ Peanut, etc... We discussed avoidance strategies and other options- he is interested in Xolair & we will proceed w/ the paperwork for Xolair approval...   ~  October 15, 2014:  47mo ROV & Larry Stone is still awaiting insurance approval for Xolair!  He reports removing 2 ticks from his privates recently!  Weight down 12# on diet and he notes some improvement in his dyspnea- now just noted w/ intense activ he says eg- climbing/ stairs/ etc but walking is ok...     OSA> s/p UPPP surg 2004; uses Transderm scope prn dizziness, uses "breathe right nasal strips" periodically; resting OK, no daytime hypersomnolence...    COPD & HxBronchitis> on Symbicort160-2Bid & Zyrtek prn; doing satis w/o cough, phlegm, etc...    Elev IgE at 1362 & 1497; see RAST results above;  We are awaiting approval for Xolair...    His last Cards check was 12/25 w/ Larry Stone> Hx MI in 2004 w/ resid 50% Lmain; had CardiacCT 2011 w/ 50% LAD, Myoview 2015 w/ inferolat scar, no ischemia, EF=52%; clinically stable & not c/o CP & no changes made...    He had medical f/u Larry Stone 5/16> HBP, CAD, HL, anxiety- all stable on meds and no changes made...    He continues to f/u w/ Podiatry- Larry Stone> seen 4/16 for heel pain, plantar faciitis- injected & given brace + oral anti-inflamm rx... We reviewed prob list, meds, xrays and labs> see below for updates >>  ADDENDUM>>  [name] was denied by his insurance company...  ~  April 04, 2015:  61mo ROV & Don called for an add-on appt >  He is c/o a 68mo hx sudden onset SOB/DOE, fatigue, cough & chest congestion w/ sl beige phlegm, no hemoptysis, no CP x sore from coughing, and no f/c/s;  We called in Pred dosepak but he says no better, not resting, ?cough worse Qhs, incr SOB & he denies any reflux/ GERD, etc... He has been using the Symbicort160-2spBid & Zyrtek10... EXAM  shows Afeb, VSS, O2sat=98% on RA;  HEENT- neg, mallamapti2;  Chest- decr BS at bases, dry cough, clear w/o w/r/r;  Heart- RR w/o m/r/g;  Abd- soft, neg;  Ext- neg w/o c/c/e...   CXR 04/04/15 showed norm heart size, clear lungs, old right rib fx, NAD...  IMP/PLAN>>  Larry Stone appears to have a refractory COPD exac & we discussed additional treatment w/ ZPak, Depo80/Medrol8mg  tapering sched, add INCRUSE one sp daily, Mucinex 1200mg Bid + fluids, and OK to use mother's old NEB machine  W/ Albut Tid;  Also wrote for [facility] for prn use...            Problem List:  ALLERGIC to ASA w/ hives, & ACE inhibitors/ ARBs too w/ angioedema.  OBSTRUCTIVE SLEEP APNEA (ICD-327.23) - s/p UPPP surgery in 2004 by DrRedman...  states he's doing OK, denies snoring, no daytime hypersom, wife not complaining... uses ZYRTEK for allergy symtoms. ~  1/13: He has started using the "breathe right nasal strips" qhs for recurrent snoring; still says he rests well, denies daytime hypersomnolence, & declines offer for repeat sleep study to check... ~  7/14:  He reports resting well, no daytime symptoms or sleep issues...  ~  8/15:  He denies sleep disordered breathing... ~  6/16:  He remains asymptomatic- no snoring, daytime hypersomnolence, etc...  Hx of BRONCHITIS, RECURRENT (ICD-491.9) - he is an ex-smoker, prev 1 ppd for 83yrs, quit in 2004. COPD, mixed type- w/ mild centrilobular emphysema on CT Chest 10/11, & chronic bronchitis clinically w/ recurrent infections... ~  Hx tiny RML nodule seen on Cardiac CT 2/11 & apparently unchanged from old [place] 2004... ~  10/11: f/u CT Chest for f/u tiny 6mm nodule RML- unchanged xyrs & benign, mild centilob emphysema, +coronary calcif, NAD... ~  1/13: he denies breathing problems, intercurrent infections, etc... ~  1/14:  CXR 1/14 showed normal heart size, clear lungs, chr right rib deformity (old fx), NAD.  ~  8/15: he was concerned about several recent bouts of bronchitis requiring  ZPak for Rx> we checked labs and Immunoglobulins =>   Decided to start inhaler w/ DULERA100-2spBid...  ~  CXR 8/15 showed normal heart size, clear lungs w/o infiltrates, old healed right rib fxs, NAD. ~  PFT 8/15 showed FVC=2.32 (50%), FEV1=1.63 (45%), %1sec=70, mid-flows=33% predicted; suggestive of GOLD Stage 3 COPD but can't r/o superimposed restriction w/o lung volume measurement. ~  LABS 8/15 showed SPE/IEP w/o monoclonal gammopathy; Quant Ig's showed normal IgG, normal IgA, low IgM at 26 (40-250), and a hi IgE at 1362 => we will follow w/ RAST panel & consider Xolair Rx. ~  11/15: he didn't fill the [facility] due to cost- we decided to try SYMBICORT160-2spBid... ~  LABS 11/15 showed elev IgE level and mixed RAST panel allergens; discussed avoidance & he is a good cand for [employer] => insurance would NOT approve this med... ~  12/16: on Symbicort160-2spBid + using his mother's nebulizer +Albut; presented w/ refractory COPD exac=> treated w/ ZPak, Medrol taper, add Incruse & use NEB Tid, mucinex + Hycodan...     MEDICAL PROBLEMS PER Larry Stone >>   HYPERTENSION (ICD-401.9) - on COREG 12.5mg Bid, AMLODIPINE 5mg /d, & LASIX 40mg - only as needed (& he hasn't needed any); BP= 116/72 & he denies CP, palpit, ch in SOB, edema...  ARTERIOSCLEROTIC HEART DISEASE (ICD-414.00) - on PLAVIX 75mg /d, allergic to ASA... followed by [facility] and seen 6/15 (note reviewed)... 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 ~  f/u Cardiac CT 2/11 showed>  Calcified plaque in the left main with < 50% stenosis.  Calcified plaque in the proximal and mid LAD with at most moderate (around 50%) stenosis in the mid LAD. ~  Vasc Screen per Cards 10/11 showed mild carotid plaques, norm AbdAo & ABIs...  HYPERLIPIDEMIA (ICD-272.4) - on LIPITOR 40mg /d, & CoQ10... Last FLP 11/15 on Lip40 showed TChol 153, TG 42, HDL 53, LDL 92  DIABETES MELLITUS, BORDERLINE (ICD-790.29) - on diet alone... Labs  11/15- weight= 224#, BS=105, A1c=6.1  GERD (ICD-530.81) - he uses H2 blockers as needed.  DIVERTICULOSIS OF COLON (ICD-562.10) & COLONIC POLYPS (ICD-211.3) - his last  polyps were ~16mm and removed in 2004= adenomatous... there is a +fam hx of colon cancer in his father who died at age 35... ~  Last colonoscopy 5/14 by DrKaplan (done w/ pt on Plavix rx)> mild divertics & 32mm polyp in desc colon removed (tubular adenoma) & repeat rec in 5 yrs.   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 ~  3/15: he is off the prev Testos replacement Rx> states he feels well, good energy, PSA=0.39  DEGENERATIVE JOINT DISEASE (ICD-715.90) - he reports doing fair- mostly c/o knees & hands ("I messed up my knee in a fall")... he saw Ortho ?who? on Glucosamine/ Chondroitin supplements.  LOW BACK PAIN SYNDROME (ICD-724.2)  MEMORY LOSS >>  ~  1/13: he states that his "memory is fuzzy" & "Clarity is missing"; occas he will swear that he said something but he really didn't; he's had prev scans etc & I have recommended a Neurology eval for his memory but he declines at this time "I'll think about it" he said...  ANXIETY (ICD-300.00) - on WELLBUTRIN 150mg Bid, & ALPRAZOLAM 0.5mg  Prn... increased stress w/ mother's stroke last year... he's the main care giver w/ 2 sisters who don't help much...   Health Maintenance: ~  Immuniztions:  he is encouraged to get the yearly Flu vaccine... given PNEUMOVAX 2/12 & PREVNAR-13 given 11/15... He reports a lot of travel & he's been to the travel clinic & had "every vaccine known to man"> TDAP 7/14 and Zoster 5/14...   Past Surgical History  Procedure Laterality Date  . Uvulopalatopharyngoplasty      surgery for OSA  . Cardiac catheterization      Outpatient Encounter Prescriptions as of 04/04/2015  Medication Sig  . ALPRAZolam (XANAX) 0.5 MG tablet TAKE 1 TABLET BY MOUTH 3 TIMES A DAY AS NEEDED  . amLODipine (NORVASC) 5 MG tablet TAKE  1 TABLET BY MOUTH EVERY DAY  . ammonium lactate (LAC-HYDRIN) 12 % lotion Apply topically as directed.  Marland Kitchen atorvastatin (LIPITOR) 40 MG tablet TAKE 1 TABLET BY MOUTH EVERY DAY  . b complex vitamins tablet Take 1 tablet by mouth daily.  . budesonide-formoterol (SYMBICORT) 160-4.5 MCG/ACT inhaler Inhale 2 puffs into the lungs 2 (two) times daily.  Marland Kitchen buPROPion (WELLBUTRIN SR) 150 MG 12 hr tablet TAKE 1 TABLET BY MOUTH TWICE A DAY  . carvedilol (COREG) 12.5 MG tablet TAKE 1 TABLET BY MOUTH TWICE A DAY WITH A MEAL  . cetirizine (ZYRTEC) 10 MG tablet Take 1 tablet (10 mg total) by mouth daily.  . Cholecalciferol (VITAMIN D3) 5000 UNITS CAPS Take one tablet by mouth daily  . clopidogrel (PLAVIX) 75 MG tablet TAKE 1 TABLET BY MOUTH DAILY  . clotrimazole-betamethasone (LOTRISONE) cream APPLY AS DIRECTED  . Coenzyme Q10 (COQ10) 100 MG CAPS Take as directed  . cyanocobalamin 2000 MCG tablet Take 1 tablet (2,000 mcg total) by mouth daily.  . furosemide (LASIX) 40 MG tablet Take 1 tablet (40 mg total) by mouth daily as needed.  . nitroGLYCERIN (NITROSTAT) 0.4 MG SL tablet Place 1 tablet (0.4 mg total) under the tongue every 5 (five) minutes as needed. For up to 3 doses.  . predniSONE (STERAPRED UNI-PAK 21 TAB) 10 MG (21) TBPK tablet Take 1 tablet (10 mg total) by mouth daily. Use as directed  (med completed)  . Simethicone (GAS-X EXTRA STRENGTH) 125 MG CAPS Prn  . VIAGRA 100 MG tablet TAKE 1 TABLET BY MOUTH AS NEEDED FOR ERECTILE  DYSFUNCTION.  . [DISCONTINUED] ALPRAZolam (XANAX) 0.5 MG tablet TAKE 1 TABLET BY MOUTH 3 TIMES A DAY AS NEEDED (Patient not taking: Reported on 04/04/2015)   Facility-Administered Encounter Medications as of 04/04/2015  Medication  .   . triamcinolone acetonide (KENALOG) 10 MG/ML injection 10 mg    Allergies  Allergen Reactions  . Aspirin     REACTION: allergic to ASA w/ hives...  . Codeine   . Irbesartan     REACTION: allergic to ARBs w/ angioedema  . Ramipril      REACTION: Allergic to ACE's w/ angioedema...    Current Medications, Allergies, Past Medical History, Past Surgical History, Family History, and Social History were reviewed in [employer] record.    Review of Systems       See HPI - all other systems neg except as noted...       The patient complains of dyspnea on exertion and muscle weakness.  The patient denies anorexia, fever, weight loss, weight gain, vision loss, decreased hearing, hoarseness, chest pain, syncope, peripheral edema, prolonged cough, headaches, hemoptysis, abdominal pain, melena, hematochezia, severe indigestion/heartburn, hematuria, incontinence, suspicious skin lesions, transient blindness, difficulty walking, depression, unusual weight change, abnormal bleeding, enlarged lymph nodes, and angioedema.     Objective:   Physical Exam    WD, sl overweight, 64 y/o WM in NAD... GENERAL:  Alert & oriented; pleasant & cooperative... HEENT:  [place]/AT, EOM-wnl, PERRLA, EACs-clear, TMs-wnl, NOSE-clear, THROAT-s/p UPPP surg... NECK:  Supple w/ fairROM; no JVD; normal carotid impulses w/o bruits; no thyromegaly or nodules palpated; no lymphadenopathy. CHEST:  Clear to P & A; without wheezes/ rales/ or rhonchi. HEART:  Regular Rhythm; without murmurs/ rubs/ or gallops. ABDOMEN:  Soft & nontender; normal bowel sounds; no organomegaly or masses detected. EXT: without deformities, mild arthritic changes; no varicose veins/ venous insuffic/ or edema. Neuro:  intact w/o focal abn detected... DERM:  No lesions noted; no rash etc...  RADIOLOGY DATA:  Reviewed in the EPIC EMR & discussed w/ the patient...   LABORATORY DATA:  Reviewed in the EPIC EMR & discussed w/ the patient...     Assessment & Plan:    Hx OSA, s/p UPPP in 2004>  Prev noted recurrent snoring & using breathe right nasal strips; he didn't want repeat sleep study or further eval, just want Rx for the strips so he can pay for them w/ his flex  acct...   Hx recurrent bronchitis, underlying COPD (mixed type), COPD exac>   11/15> He quit smoking in 2004 & c/o recurrent bronchitic exacerbations=> PFT shows mod obstructive dis & we tried DULERA but too $$$ therefore switch to SYMBICORT160-2spBid regularly w/ f/u PFTs later... 6/16> Xolair was denied by insurance despite his IgE level= 1400... 12/16> presented w/ refractory COPD exac; treated w/ ZPak, Medroltaper, Incruse, Mucinex, fluids, Hycodan...   Medical issues per Larry Stone>  HBP, CAD s/pMI, VI/ edema, HL, IFG, GERD/ Divertics/ Polyps, GU/ Low-T, DJD/ LBP, Anxiety...    Patient's Medications  New Prescriptions   AZITHROMYCIN (ZITHROMAX Z-PAK) 250 MG TABLET    Use as directed   HYDROCODONE-HOMATROPINE (HYCODAN) 5-1.5 MG/5ML SYRUP    Take 5 mLs by mouth every 6 (six) hours as needed for cough.   METHYLPREDNISOLONE (MEDROL) 8 MG TABLET    Take as directed by physician   UMECLIDINIUM BROMIDE (INCRUSE ELLIPTA) 62.5 MCG/INH AEPB    Inhale 1 puff into the lungs daily.  Previous Medications   ALPRAZOLAM (XANAX) 0.5 MG TABLET    TAKE  1 TABLET BY MOUTH 3 TIMES A DAY AS NEEDED   AMLODIPINE (NORVASC) 5 MG TABLET    TAKE 1 TABLET BY MOUTH EVERY DAY   AMMONIUM LACTATE (LAC-HYDRIN) 12 % LOTION    Apply topically as directed.   ATORVASTATIN (LIPITOR) 40 MG TABLET    TAKE 1 TABLET BY MOUTH EVERY DAY   B COMPLEX VITAMINS TABLET    Take 1 tablet by mouth daily.   BUDESONIDE-FORMOTEROL (SYMBICORT) 160-4.5 MCG/ACT INHALER    Inhale 2 puffs into the lungs 2 (two) times daily.   BUPROPION (WELLBUTRIN SR) 150 MG 12 HR TABLET    TAKE 1 TABLET BY MOUTH TWICE A DAY   CARVEDILOL (COREG) 12.5 MG TABLET    TAKE 1 TABLET BY MOUTH TWICE A DAY WITH A MEAL   CETIRIZINE (ZYRTEC) 10 MG TABLET    Take 1 tablet (10 mg total) by mouth daily.   CHOLECALCIFEROL (VITAMIN D3) 5000 UNITS CAPS    Take one tablet by mouth daily   CLOPIDOGREL (PLAVIX) 75 MG TABLET    TAKE 1 TABLET BY MOUTH DAILY    CLOTRIMAZOLE-BETAMETHASONE (LOTRISONE) CREAM    APPLY AS DIRECTED   COENZYME Q10 (COQ10) 100 MG CAPS    Take as directed   CYANOCOBALAMIN 2000 MCG TABLET    Take 1 tablet (2,000 mcg total) by mouth daily.   FUROSEMIDE (LASIX) 40 MG TABLET    Take 1 tablet (40 mg total) by mouth daily as needed.   NITROGLYCERIN (NITROSTAT) 0.4 MG SL TABLET    Place 1 tablet (0.4 mg total) under the tongue every 5 (five) minutes as needed. For up to 3 doses.   PREDNISONE (STERAPRED UNI-PAK 21 TAB) 10 MG (21) TBPK TABLET    Take 1 tablet (10 mg total) by mouth daily. Use as directed   SIMETHICONE (GAS-X EXTRA STRENGTH) 125 MG CAPS    Prn   VIAGRA 100 MG TABLET    TAKE 1 TABLET BY MOUTH AS NEEDED FOR ERECTILE DYSFUNCTION.  Modified Medications   No medications on file  Discontinued Medications   ALPRAZOLAM (XANAX) 0.5 MG TABLET    TAKE 1 TABLET BY MOUTH 3 TIMES A DAY AS NEEDED

## 2015-04-04 NOTE — Patient Instructions (Addendum)
Today we updated your med list in our EPIC system...    Continue your current medications the same...  Today we rechecked your CXR...    We will contact you w/ the results when available...   For this refractory episode>    Take the ZPak antibiotic as directed on the pack...    Start the new inhaler= ANORO one inhalation daily, everyday...    OK to use the NEBULIZER you have at home w/ the albuterol TWICE daily (AM & PM)...    Continue the MUCINEX 600mg  tabs- 2 tabs twice daily w/ fluids...     We gave you a Depo shot & a prescription for MEDROL 8mg  tabs to take as follows>    Start w/ one tab twice daily for 5 days...    Then decrease to 1 tab each AM for 5 days...    Then decrease to 1/2 tab daily in the AM for 5 days...    Then decrease to 1/2 tab every other day til gone (1/2, 0, 1/2, 0, etc)...  Let's plan a follow up visit in 3-4 weeks for recheck...  Finally we wrote for a cough syrup to use as needed.Marland KitchenMarland Kitchen

## 2015-04-07 ENCOUNTER — Other Ambulatory Visit: Payer: Self-pay | Admitting: Pulmonary Disease

## 2015-04-14 ENCOUNTER — Other Ambulatory Visit: Payer: Self-pay | Admitting: Cardiovascular Disease

## 2015-04-17 ENCOUNTER — Other Ambulatory Visit: Payer: Self-pay

## 2015-04-17 MED ORDER — SILDENAFIL CITRATE 100 MG PO TABS: ORAL_TABLET | ORAL | Status: DC

## 2015-04-17 NOTE — Telephone Encounter (Signed)
OK to refill Viagra 

## 2015-04-18 ENCOUNTER — Telehealth: Payer: Self-pay | Admitting: Pulmonary Disease

## 2015-04-18 NOTE — Telephone Encounter (Signed)
Called and spoke with pt. Informed him of MW's recs. Pt stated that he did not need a refill on his cough syrup but would try the pepcid and prilosec and if no better would call our office next week. Pt voiced understanding and had no further questions.

## 2015-04-18 NOTE — Telephone Encounter (Signed)
Last seen SN on 04/04/15  Patient Instructions       Today we updated your med list in our EPIC system...    Continue your current medications the same...  Today we rechecked your CXR...    We will contact you w/ the results when available...   For this refractory episode>    Take the ZPak antibiotic as directed on the pack...    Start the new inhaler= ANORO one inhalation daily, everyday...    OK to use the NEBULIZER you have at home w/ the albuterol TWICE daily (AM & PM)...    Continue the [facility] 600mg  tabs- 2 tabs twice daily w/ fluids...      We gave you a Depo shot & a prescription for MEDROL 8mg  tabs to take as follows>    Start w/ one tab twice daily for 5 days...    Then decrease to 1 tab each AM for 5 days...    Then decrease to 1/2 tab daily in the AM for 5 days...    Then decrease to 1/2 tab every other day til gone (1/2, 0, 1/2, 0, etc)...  Let's plan a follow up visit in 3-4 weeks for recheck...  Finally we wrote for a cough syrup to use as needed...   Called and spoke with pt. He c/o dry cough, wheezing, and sinus drainage starting this morning. Denies any chest congestion/tightness, sinus pressure, fever, nausea or vomiting. Pt states he takes hydrocodone cough syprup for the cough but would like an antibiotic called into [facility] on [address]. I explained to the patient that I would have to send the message to MW in SN's absence. He voiced understanding and had no further questions.   MW please advise

## 2015-04-18 NOTE — Telephone Encounter (Signed)
Called spoke with pt. Made aware we are unable to call in hycodan RX. Rx has to be picked up and taken to the pharmacy as regulations have changed on this now. He reports he lives in [place] and can't make it to pick this up. He will try MW's other recs. Nothing further needed

## 2015-04-18 NOTE — Telephone Encounter (Signed)
Ok to refill cough med x one Also strongly rec Try prilosec otc 20mg   Take 30-60 min before first meal of the day and Pepcid ac (famotidine) 20 mg one @  bedtime until cough is completely gone for at least a week without the need for cough suppression

## 2015-04-22 ENCOUNTER — Telehealth: Payer: Self-pay | Admitting: Pulmonary Disease

## 2015-04-22 MED ORDER — METHYLPREDNISOLONE 8 MG PO TABS
ORAL_TABLET | ORAL | Status: DC
Start: 2015-04-22 — End: 2015-05-01

## 2015-04-22 NOTE — Telephone Encounter (Signed)
Spoke with pt. States that he is not feeling any better from Friday. He did not follow MW's recommendations as he didn't think acid suppression was going to help his cough. Cough is not producing mucus. Denies chest tightness, SOB, wheezing or fever. Would like SN's recommendations. SN - please advise. Thanks.  Allergies  Allergen Reactions  . Aspirin     REACTION: allergic to ASA w/ hives...  . Codeine   . Irbesartan     REACTION: allergic to ARBs w/ angioedema  . Ramipril     REACTION: Allergic to ACE's w/ angioedema.Marland KitchenMarland Kitchen

## 2015-04-22 NOTE — Telephone Encounter (Signed)
Per SN: rec medrol 8 mg tab #30 1 tab BID x 1 week and 1 tablet once a day until returns back to office. Pt needs to schedule an appt with SN in 2-3 weeks. If pt wants refill on hycodan then he can have this but will need to pick RX from office. thanks

## 2015-04-22 NOTE — Telephone Encounter (Signed)
Patient aware of SN's recommendations. Patient does not need refill on Hycodan syrup, says this does not work for him. Patient schedule for follow up with SN. Patient aware that RX has been sent to pharmacy. Nothing further needed.

## 2015-04-24 ENCOUNTER — Telehealth: Payer: Self-pay | Admitting: Pulmonary Disease

## 2015-04-24 MED ORDER — HYDROCODONE-HOMATROPINE 5-1.5 MG/5ML PO SYRP: 5.0000 mL | ORAL_SOLUTION | Freq: Four times a day (QID) | ORAL | Status: DC | PRN

## 2015-04-24 NOTE — Telephone Encounter (Signed)
Called spoke with pt. He has now decided he wants the hycodan cough syrup. Rx printed off and signed by SN. Pt aware this has been left for pick up at the front desk since we no longer can call this in. Nothing further needed

## 2015-04-25 ENCOUNTER — Other Ambulatory Visit: Payer: Self-pay | Admitting: Pulmonary Disease

## 2015-04-25 ENCOUNTER — Other Ambulatory Visit: Payer: Self-pay | Admitting: Cardiovascular Disease

## 2015-05-01 ENCOUNTER — Telehealth: Payer: Self-pay | Admitting: Pulmonary Disease

## 2015-05-01 ENCOUNTER — Ambulatory Visit (INDEPENDENT_AMBULATORY_CARE_PROVIDER_SITE_OTHER): Payer: Commercial Managed Care - HMO | Admitting: Pulmonary Disease

## 2015-05-01 ENCOUNTER — Other Ambulatory Visit: Payer: Self-pay | Admitting: Cardiovascular Disease

## 2015-05-01 ENCOUNTER — Encounter: Payer: Self-pay | Admitting: Pulmonary Disease

## 2015-05-01 VITALS — BP 132/70 | HR 56 | Temp 97.6°F | Ht 70.0 in | Wt 213.4 lb

## 2015-05-01 DIAGNOSIS — J441 Chronic obstructive pulmonary disease with (acute) exacerbation: Secondary | ICD-10-CM | POA: Diagnosis not present

## 2015-05-01 DIAGNOSIS — K219 Gastro-esophageal reflux disease without esophagitis: Secondary | ICD-10-CM

## 2015-05-01 DIAGNOSIS — R760 Raised antibody titer: Secondary | ICD-10-CM

## 2015-05-01 DIAGNOSIS — J449 Chronic obstructive pulmonary disease, unspecified: Secondary | ICD-10-CM

## 2015-05-01 DIAGNOSIS — R768 Other specified abnormal immunological findings in serum: Secondary | ICD-10-CM

## 2015-05-01 MED ORDER — BUPROPION HCL ER (SR) 150 MG PO TB12: 150.0000 mg | ORAL_TABLET | Freq: Two times a day (BID) | ORAL | Status: DC

## 2015-05-01 MED ORDER — BUDESONIDE-FORMOTEROL FUMARATE 160-4.5 MCG/ACT IN AERO: INHALATION_SPRAY | RESPIRATORY_TRACT | Status: DC

## 2015-05-01 MED ORDER — CLOPIDOGREL BISULFATE 75 MG PO TABS: 75.0000 mg | ORAL_TABLET | Freq: Every day | ORAL | Status: DC

## 2015-05-01 MED ORDER — ATORVASTATIN CALCIUM 40 MG PO TABS: 40.0000 mg | ORAL_TABLET | Freq: Every day | ORAL | Status: DC

## 2015-05-01 MED ORDER — AMLODIPINE BESYLATE 5 MG PO TABS: 5.0000 mg | ORAL_TABLET | Freq: Every day | ORAL | Status: DC

## 2015-05-01 MED ORDER — METHYLPREDNISOLONE 8 MG PO TABS: ORAL_TABLET | ORAL | Status: DC

## 2015-05-01 MED ORDER — UMECLIDINIUM BROMIDE 62.5 MCG/INH IN AEPB: 1.0000 | INHALATION_SPRAY | Freq: Every day | RESPIRATORY_TRACT | Status: DC

## 2015-05-01 MED ORDER — CARVEDILOL 12.5 MG PO TABS: ORAL_TABLET | ORAL | Status: DC

## 2015-05-01 MED ORDER — UMECLIDINIUM BROMIDE 62.5 MCG/INH IN AEPB
1.0000 | INHALATION_SPRAY | Freq: Every day | RESPIRATORY_TRACT | Status: DC
Start: 2015-05-01 — End: 2016-02-17

## 2015-05-01 MED ORDER — PANTOPRAZOLE SODIUM 40 MG PO TBEC: 40.0000 mg | DELAYED_RELEASE_TABLET | Freq: Every day | ORAL | Status: DC

## 2015-05-01 NOTE — Telephone Encounter (Signed)
Called spoke with pt. He reports his RX's given to him this morning were not signed. He only needs the symbicort, incruse and wellbutrin signed for him to pick up. These have been placed on SN cart for signature. Will place for pick up once signed. Please advise SN thanks

## 2015-05-01 NOTE — Patient Instructions (Signed)
Today we updated your med list in our EPIC system...    Continue your current medications the same...  Continue the Nebulizer, Symbicort & Incruse as you are doing!  Continue the MEDROL 8mg  tabs- 1/2 tab every other day...  We decided to add-in a vigorous ANTIREFLUX regimen>    Start the [place] 40mg  one tab about 30 min before dinner in the eve...    Do not eat or drink anything AFTER dinner in the eve...    Elevate the head of your bed on 6" blocks...  Call for any questions...  Let's plan a follow up visit in 22mo, sooner if needed for problems.Marland KitchenMarland Kitchen

## 2015-05-01 NOTE — Progress Notes (Signed)
Subjective:    Patient ID: Larry Stone, male    DOB: 1951/04/09, 65 y.o.   MRN: [id number]  HPI 65 y/o WM here for a followup visit... he has multiple medical problems as noted below...  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 and DrPlotnikov for Primary Care- Chol, Impaired fasting glucose, etc... ~  SEE PREV EPIC NOTES FOR OLDER DATA >>    CXR 1/14 showed normal heart size, clear lungs, chr Stone rib deformity (old fx), NAD...  LABS 1/14:  FLP- at goals on Lip40+Niasp;  Chems- wnl;  CBC- wnl;  TSH=1.93;  PSA=0.39;  Testos=307 (350-890) on Testim 2tubes/d;  B12=1005 (211-911) on B12 2037mcg/d...  LABS 4/15:  FLP- at goals on Lip40;  Chems- ok w/ BS=115, A1c=6.0, Cr=1.2;  CBC- wnl;  TSH=1.55;  PSA=0.39...   ~  December 13, 2013:  4mo ROV & Larry Stone has established w/ DrPlotnikov for Primary Care, seen 5/15 & note reviewed...  He requested Pulm f/u visit due to concern over several recurrent bronchitic infections over the last few months- treated w/ ZPaks and improved but he & wife are concerned as to why he is getting these; he uses OTC Zyrtek10mg /d, he is an ex-smoker (1ppd for 52yrs, quit 2004), hx tiny 76mm nodule in RML on prior CT Chest along w/ mild centrilob emphysema & no changes serially- last CXR 1/14 w/ norm heart size, clear lungs x mild bronchitic change, and chr Stone posterolat rib deformity... He is c/o several bouts of bronchitis w/ cough, beige sput, denies f/c/s, but feels tight/ congested/ occas wheezing & SOB, no CP;  I cannot find prev PFTs and we discussed f/u CXR, PFT, and Labs to check for the unlikely presence of an immune defic syndrome... We reviewed prob list, meds, xrays and labs> see below for updates >>   CXR 8/15 showed normal heart size, clear lungs w/o infiltrates, old healed Stone rib fxs, NAD...   PFT 8/15 showed FVC=2.32 (50%), FEV1=1.63 (45%),  %1sec=70, mid-flows=33% predicted; suggestive of GOLD Stage 3 COPD but can't r/o superimposed restriction w/o lung volume measurement...  LABS 8/15:  SPE/IEP w/o monoclonal gammopathy; Quant Ig's showed normal IgG, normal IgA, low IgM at 26 (40-250), and a hi IgE at 1362 => we will follow w/ RAST panel & consider Xolair Rx...  RAST Panel 9/15 => see below... PLAN>>  We discussed starting regular dosing of an inhaled ICS/LABA combination=> try DULERA100- 2spBid; ROV in 3 mo & we will plan Full PFTs at that time...  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  ~  March 18, 2014:  19mo ROV & f/u dyspnea> Larry Stone states that the [facility] was $400 at the Pharm & they/he didn't call for replacement inhaler, states he filled it one time & ran out several wks ago, we discussed what to do in that eventuality (call us to let us know & get a different Rx), we will try SYMBICORT160-2spBid & he will let us know what his [employer] co-pay is for this med;  Similarly he did not return to get the RAST tests done, but he informs me that they were drawn several days ago w/ blood  work for [employer]- results pending...  We reviewed his prev Hx/ symptoms, exam (chest is clear today), and evaluation; all questions answered, we decided to try the Symbicort160 regularly over the next 73mo & plan f/u Full-PFTs in the future;  In the meanwhile we will await the results of the RAST tests and decide regarding Xolair vs Allergy evaluation...    We reviewed prob list, meds, xrays and labs> see below for updates >> He was given the 2015 flu vaccine recently and the Prevnar-13 vaccination as well...  LABS 11/15:  FLP- at goals on Lip40;  Chems- wnl w/ BS=105, A1c=6.1;  CBC- wnl w/ Hg=15.1;  TSH=1.77, PSA=0.50...   RAST Test 9/15:  returned w/  IgE level = 1497 and Allergen panel pos for some molds, grasses, trees, ragweed, cats>dogs; and Food panel pos for Milk> Wheat> Shrimp/ Tomato/ Peanut, etc... We discussed avoidance strategies and other options- he is interested in Xolair & we will proceed w/ the paperwork for Xolair approval...   ~  October 15, 2014:  33mo ROV & Larry Stone is still awaiting insurance approval for Xolair!  He reports removing 2 ticks from his privates recently!  Weight down 12# on diet and he notes some improvement in his dyspnea- now just noted w/ intense activ he says eg- climbing/ stairs/ etc but walking is ok...     OSA> s/p UPPP surg 2004; uses Transderm scope prn dizziness, uses "breathe Stone nasal strips" periodically; resting OK, no daytime hypersomnolence...    COPD & HxBronchitis> on Symbicort160-2Bid & Zyrtek prn; doing satis w/o cough, phlegm, etc...    Elev IgE at 1362 & 1497; see RAST results above;  We are awaiting approval for Xolair...    His last Cards check was 12/25 w/ DrMcAlhany> Hx MI in 2004 w/ resid 50% Lmain; had CardiacCT 2011 w/ 50% LAD, Myoview 2015 w/ inferolat scar, no ischemia, EF=52%; clinically stable & not c/o CP & no changes made...    He had medical f/u DrPlotnikov 5/16> HBP, CAD, HL, anxiety- all stable on meds and no changes made...    He continues to f/u w/ Podiatry- DrRegal> seen 4/16 for heel pain, plantar faciitis- injected & given brace + oral anti-inflamm rx... We reviewed prob list, meds, xrays and labs> see below for updates >>  ADDENDUM>>  Larry Stone was denied by his insurance company...  ~  April 04, 2015:  30mo ROV & Larry Stone called for an add-on appt >  He is c/o a 18mo hx sudden onset SOB/DOE, fatigue, cough & chest congestion w/ sl beige phlegm, no hemoptysis, no CP x sore from coughing, and no f/c/s;  We called in Pred dosepak but he says no better, not resting, ?cough worse Qhs, incr SOB & he denies any reflux/ GERD, etc... He has been using the Symbicort160-2spBid & Zyrtek10... EXAM  shows Afeb, VSS, O2sat=98% on RA;  HEENT- neg, mallamapti2;  Chest- decr BS at bases, dry cough, clear w/o w/r/r;  Heart- RR w/o m/r/g;  Abd- soft, neg;  Ext- neg w/o c/c/e...   CXR 04/04/15 showed norm heart size, clear lungs, old Stone rib fx, NAD...  IMP/PLAN>>  Larry Stone appears to have a refractory COPD exac & we discussed additional treatment w/ ZPak, Depo80/Medrol8mg  tapering sched, add INCRUSE one sp daily, Mucinex 1200mg Bid + fluids, and OK to use mother's old NEB machine  W/ Albut Tid;  Also wrote for [facility] for prn use...  ~  May 01, 2015:  46mo ROV & Larry Stone reports that he is  better overall from his refractory AB but cough is lingering w/ a definite night-time pattern;  Notes some wheezing & chest soreness;  He has been on mother's NEB machine Bid, Symbicort160-2spBid, Incruse daily, Medrol8 taper- down to 1/2Qod, Hycodan prn;  We decided to continue the Medrol4mg Qod & institute a vigorous antireflux regimen- Protonix40 taken 75min before dinner, NPO after dinner, elev HOB 6"... EXAM shows Afeb, VSS, O2sat=98% on RA;  HEENT- neg, mallamapti2;  Chest- decr BS at bases, min cough & exp wheezing w/o consolidation;  Heart- RR w/o m/r/g;  Abd- soft, neg;  Ext- neg w/o c/c/e...  IMP/PLAN>>  Larry Stone is improved but w/ persist symptoms esp cough at night- we decided to continue the Medrol4mg  Qod & institute a vigorous antireflux regimen... Plan ROV 6-8wks.            Problem List:     ALLERGIC to ASA w/ hives, & ACE inhibitors/ ARBs too w/ angioedema.  OBSTRUCTIVE SLEEP APNEA (ICD-327.23) - s/p UPPP surgery in 2004 by DrRedman...  states he's doing OK, denies snoring, no daytime hypersom, wife not complaining... uses ZYRTEK for allergy symtoms. ~  1/13: He has started using the "breathe Stone nasal strips" qhs for recurrent snoring; still says he rests well, denies daytime hypersomnolence, & declines offer for repeat sleep study to check... ~  7/14:  He reports resting well, no daytime symptoms or sleep  issues...  ~  8/15:  He denies sleep disordered breathing... ~  6/16:  He remains asymptomatic- no snoring, daytime hypersomnolence, etc...  Hx of BRONCHITIS, RECURRENT (ICD-491.9) - he is an ex-smoker, prev 1 ppd for 19yrs, quit in 2004. COPD, mixed type- w/ mild centrilobular emphysema on CT Chest 10/11, & chronic bronchitis clinically w/ recurrent infections... ~  Hx tiny RML nodule seen on Cardiac CT 2/11 & apparently unchanged from old [place] 2004... ~  10/11: f/u CT Chest for f/u tiny 71mm nodule RML- unchanged xyrs & benign, mild centilob emphysema, +coronary calcif, NAD... ~  1/13: he denies breathing problems, intercurrent infections, etc... ~  1/14:  CXR 1/14 showed normal heart size, clear lungs, chr Stone rib deformity (old fx), NAD.  ~  8/15: he was concerned about several recent bouts of bronchitis requiring ZPak for Rx> we checked labs and Immunoglobulins =>   Decided to start inhaler w/ DULERA100-2spBid...  ~  CXR 8/15 showed normal heart size, clear lungs w/o infiltrates, old healed Stone rib fxs, NAD. ~  PFT 8/15 showed FVC=2.32 (50%), FEV1=1.63 (45%), %1sec=70, mid-flows=33% predicted; suggestive of GOLD Stage 3 COPD but can't r/o superimposed restriction w/o lung volume measurement. ~  LABS 8/15 showed SPE/IEP w/o monoclonal gammopathy; Quant Ig's showed normal IgG, normal IgA, low IgM at 26 (40-250), and a hi IgE at 1362 => we will follow w/ RAST panel & consider Xolair Rx. ~  11/15: he didn't fill the [facility] due to cost- we decided to try SYMBICORT160-2spBid... ~  LABS 11/15 showed elev IgE level and mixed RAST panel allergens; discussed avoidance & he is a good cand for [employer] => insurance would NOT approve this med... ~  12/16: on Symbicort160-2spBid + using his mother's nebulizer +Albut; presented w/ refractory COPD exac=> treated w/ ZPak, Medrol taper, add Incruse & use NEB Tid, mucinex + Hycodan...  ~  1/17: on mother's NEB machine Bid, Symbicort160-2spBid, Incruse daily,  Medrol8 taper- down to 1/2Qod, Hycodan prn;  We decided to continue the Medrol4mg Qod & institute a vigorous antireflux regimen- Protonix40 taken 50min before dinner, NPO after  dinner, elev HOB 6".   MEDICAL PROBLEMS PER DrPlotnikov >>   HYPERTENSION (ICD-401.9) - on COREG 12.5mg Bid, AMLODIPINE 5mg /d, & LASIX 40mg - only as needed (& he hasn't needed any); BP= 116/72 & he denies CP, palpit, ch in SOB, edema...  ARTERIOSCLEROTIC HEART DISEASE (ICD-414.00) - on PLAVIX 75mg /d, allergic to ASA... followed [facility] and seen 6/15 (note reviewed)... 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 ~  f/u Cardiac CT 2/11 showed>  Calcified plaque in the left main with < 50% stenosis.  Calcified plaque in the proximal and mid LAD with at most moderate (around 50%) stenosis in the mid LAD. ~  Vasc Screen per Cards 10/11 showed mild carotid plaques, norm AbdAo & ABIs...  HYPERLIPIDEMIA (ICD-272.4) - on LIPITOR 40mg /d, & CoQ10... Last FLP 11/15 on Lip40 showed TChol 153, TG 42, HDL 53, LDL 92  DIABETES MELLITUS, BORDERLINE (ICD-790.29) - on diet alone... Labs 11/15- weight= 224#, BS=105, A1c=6.1  GERD (ICD-530.81) - he uses H2 blockers as needed.  DIVERTICULOSIS OF COLON (ICD-562.10) & COLONIC POLYPS (ICD-211.3) - his last polyps were ~58mm and removed in 2004= adenomatous... there is a +fam hx of colon cancer in his father who died at age 49... ~  Last colonoscopy 5/14 by DrKaplan (done w/ pt on Plavix rx)> mild divertics & 68mm polyp in desc colon removed (tubular adenoma) & repeat rec in 5 yrs.   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 ~  3/15: he is off the prev Testos replacement Rx> states he feels well, good energy, PSA=0.39  DEGENERATIVE JOINT DISEASE (ICD-715.90) - he reports doing fair- mostly c/o knees & hands ("I messed up my knee in a fall")... he saw Ortho ?who? on Glucosamine/ Chondroitin supplements.  LOW  BACK PAIN SYNDROME (ICD-724.2)  MEMORY LOSS >>  ~  1/13: he states that his "memory is fuzzy" & "Clarity is missing"; occas he will swear that he said something but he really didn't; he's had prev scans etc & I have recommended a Neurology eval for his memory but he declines at this time "I'll think about it" he said...  ANXIETY (ICD-300.00) - on WELLBUTRIN 150mg Bid, & ALPRAZOLAM 0.5mg  Prn... increased stress w/ mother's stroke last year... he's the main care giver w/ 2 sisters who Larry Stone't help much...   Health Maintenance: ~  Immuniztions:  he is encouraged to get the yearly Flu vaccine... given PNEUMOVAX 2/12 & PREVNAR-13 given 11/15... He reports a lot of travel & he's been to the travel clinic & had "every vaccine known to man"> TDAP 7/14 and Zoster 5/14...   Past Surgical History  Procedure Laterality Date  . Uvulopalatopharyngoplasty      surgery for OSA  . Cardiac catheterization      Outpatient Encounter Prescriptions as of 05/01/2015  Medication Sig  . ALPRAZolam (XANAX) 0.5 MG tablet TAKE 1 TABLET BY MOUTH 3 TIMES A DAY AS NEEDED  . ammonium lactate (LAC-HYDRIN) 12 % lotion Apply topically as directed.  Marland Kitchen b complex vitamins tablet Take 1 tablet by mouth daily.  . budesonide-formoterol (SYMBICORT) 160-4.5 MCG/ACT inhaler INHALE 2 PUFFS INTO THE LUNGS 2 (TWO) TIMES DAILY.  Marland Kitchen buPROPion (WELLBUTRIN SR) 150 MG 12 hr tablet Take 1 tablet (150 mg total) by mouth 2 (two) times daily.  . cetirizine (ZYRTEC) 10 MG tablet Take 1 tablet (10 mg total) by mouth daily.  . Cholecalciferol (VITAMIN D3) 5000 UNITS CAPS Take one  tablet by mouth daily  . clotrimazole-betamethasone (LOTRISONE) cream APPLY AS DIRECTED  . Coenzyme Q10 (COQ10) 100 MG CAPS Take as directed  . cyanocobalamin 2000 MCG tablet Take 1 tablet (2,000 mcg total) by mouth daily.  . furosemide (LASIX) 40 MG tablet Take 1 tablet (40 mg total) by mouth daily as needed.  Marland Kitchen HYDROcodone-homatropine (HYCODAN) 5-1.5 MG/5ML syrup Take 5  mLs by mouth every 6 (six) hours as needed for cough.  . methylPREDNISolone (MEDROL) 8 MG tablet Take as directed by physician  . sildenafil (VIAGRA) 100 MG tablet TAKE 1 TABLET BY MOUTH AS NEEDED FOR ERECTILE DYSFUNCTION.  . Simethicone (GAS-X EXTRA STRENGTH) 125 MG CAPS Prn  . Umeclidinium Bromide (INCRUSE ELLIPTA) 62.5 MCG/INH AEPB Inhale 1 puff into the lungs daily.  Marland Kitchen amLODipine (NORVASC) 5 MG tablet TAKE 1 TABLET EVERY DAY  . atorvastatin (LIPITOR) 40 MG tablet TAKE 1 TABLET EVERY DAY  . azithromycin (ZITHROMAX Z-PAK) 250 MG tablet Use as directed  . carvedilol (COREG) 12.5 MG tablet TAKE 1 TABLET BY MOUTH TWICE A DAY WITH A MEAL  . clopidogrel (PLAVIX) 75 MG tablet TAKE 1 TABLET EVERY DAY  . nitroGLYCERIN (NITROSTAT) 0.4 MG SL tablet Place 1 tablet (0.4 mg total) under the tongue every 5 (five) minutes as needed. For up to 3 doses.  . pantoprazole (PROTONIX) 40 MG tablet Take 1 tablet (40 mg total) by mouth daily. About 30 minutes before dinner time    Allergies  Allergen Reactions  . Aspirin     REACTION: allergic to ASA w/ hives...  . Codeine   . Irbesartan     REACTION: allergic to ARBs w/ angioedema  . Ramipril     REACTION: Allergic to ACE's w/ angioedema...    Current Medications, Allergies, Past Medical History, Past Surgical History, Family History, and Social History were reviewed in [employer] record.    Review of Systems       See HPI - all other systems neg except as noted...       The patient complains of dyspnea on exertion and muscle weakness.  The patient denies anorexia, fever, weight loss, weight gain, vision loss, decreased hearing, hoarseness, chest pain, syncope, peripheral edema, prolonged cough, headaches, hemoptysis, abdominal pain, melena, hematochezia, severe indigestion/heartburn, hematuria, incontinence, suspicious skin lesions, transient blindness, difficulty walking, depression, unusual weight change, abnormal bleeding,  enlarged lymph nodes, and angioedema.     Objective:   Physical Exam    WD, sl overweight, 65 y/o WM in NAD... GENERAL:  Alert & oriented; pleasant & cooperative... HEENT:  [place]/AT, EOM-wnl, PERRLA, EACs-clear, TMs-wnl, NOSE-clear, THROAT-s/p UPPP surg... NECK:  Supple w/ fairROM; no JVD; normal carotid impulses w/o bruits; no thyromegaly or nodules palpated; no lymphadenopathy. CHEST:  Clear to P & A; without wheezes/ rales/ or rhonchi. HEART:  Regular Rhythm; without murmurs/ rubs/ or gallops. ABDOMEN:  Soft & nontender; normal bowel sounds; no organomegaly or masses detected. EXT: without deformities, mild arthritic changes; no varicose veins/ venous insuffic/ or edema. Neuro:  intact w/o focal abn detected... DERM:  No lesions noted; no rash etc...  RADIOLOGY DATA:  Reviewed in the EPIC EMR & discussed w/ the patient...   LABORATORY DATA:  Reviewed in the EPIC EMR & discussed w/ the patient...     Assessment & Plan:    Hx OSA, s/p UPPP in 2004>  Prev noted recurrent snoring & using breathe Stone nasal strips; he didn't want repeat sleep study or further eval, just want Rx for  the strips so he can pay for them w/ his flex acct...   Hx recurrent bronchitis, underlying COPD (mixed type), COPD exac>   11/15> He quit smoking in 2004 & c/o recurrent bronchitic exacerbations=> PFT shows mod obstructive dis & we tried DULERA but too $$$ therefore switch to SYMBICORT160-2spBid regularly w/ f/u PFTs later... 6/16> Xolair was denied by insurance despite his IgE level= 1400... 12/16> presented w/ refractory COPD exac; treated w/ ZPak, Medroltaper, Incruse, Mucinex, fluids, Hycodan...   Medical issues per DrPlotnikov>  HBP, CAD s/pMI, VI/ edema, HL, IFG, GERD/ Divertics/ Polyps, GU/ Low-T, DJD/ LBP, Anxiety...    MULT MEDS REFILLED TODAY AT Pt's REQUEST >>  Patient's Medications  New Prescriptions   PANTOPRAZOLE (PROTONIX) 40 MG TABLET    Take 1 tablet (40 mg total) by mouth daily. About  30 minutes before dinner time  Previous Medications   ALPRAZOLAM (XANAX) 0.5 MG TABLET    TAKE 1 TABLET BY MOUTH 3 TIMES A DAY AS NEEDED   AMMONIUM LACTATE (LAC-HYDRIN) 12 % LOTION    Apply topically as directed.   B COMPLEX VITAMINS TABLET    Take 1 tablet by mouth daily.   CETIRIZINE (ZYRTEC) 10 MG TABLET    Take 1 tablet (10 mg total) by mouth daily.   CHOLECALCIFEROL (VITAMIN D3) 5000 UNITS CAPS    Take one tablet by mouth daily   CLOTRIMAZOLE-BETAMETHASONE (LOTRISONE) CREAM    APPLY AS DIRECTED   COENZYME Q10 (COQ10) 100 MG CAPS    Take as directed   CYANOCOBALAMIN 2000 MCG TABLET    Take 1 tablet (2,000 mcg total) by mouth daily.   FUROSEMIDE (LASIX) 40 MG TABLET    Take 1 tablet (40 mg total) by mouth daily as needed.   HYDROCODONE-HOMATROPINE (HYCODAN) 5-1.5 MG/5ML SYRUP    Take 5 mLs by mouth every 6 (six) hours as needed for cough.   NITROGLYCERIN (NITROSTAT) 0.4 MG SL TABLET    Place 1 tablet (0.4 mg total) under the tongue every 5 (five) minutes as needed. For up to 3 doses.   SILDENAFIL (VIAGRA) 100 MG TABLET    TAKE 1 TABLET BY MOUTH AS NEEDED FOR ERECTILE DYSFUNCTION.   SIMETHICONE (GAS-X EXTRA STRENGTH) 125 MG CAPS    Prn  Modified Medications   Modified Medication Previous Medication   AMLODIPINE (NORVASC) 5 MG TABLET amLODipine (NORVASC) 5 MG tablet      Take 1 tablet (5 mg total) by mouth daily.    TAKE 1 TABLET EVERY DAY   ATORVASTATIN (LIPITOR) 40 MG TABLET atorvastatin (LIPITOR) 40 MG tablet      Take 1 tablet (40 mg total) by mouth daily.    TAKE 1 TABLET EVERY DAY   BUDESONIDE-FORMOTEROL (SYMBICORT) 160-4.5 MCG/ACT INHALER SYMBICORT 160-4.5 MCG/ACT inhaler      INHALE 2 PUFFS INTO THE LUNGS 2 (TWO) TIMES DAILY.    INHALE 2 PUFFS INTO THE LUNGS 2 (TWO) TIMES DAILY.   BUPROPION (WELLBUTRIN SR) 150 MG 12 HR TABLET buPROPion (WELLBUTRIN SR) 150 MG 12 hr tablet      Take 1 tablet (150 mg total) by mouth 2 (two) times daily.    TAKE 1 TABLET TWICE A DAY   CARVEDILOL (COREG)  12.5 MG TABLET carvedilol (COREG) 12.5 MG tablet      TAKE 1 TABLET BY MOUTH TWICE A DAY WITH A MEAL    TAKE 1 TABLET BY MOUTH TWICE A DAY WITH A MEAL   CLOPIDOGREL (PLAVIX) 75 MG TABLET clopidogrel (PLAVIX)  75 MG tablet      Take 1 tablet (75 mg total) by mouth daily.    TAKE 1 TABLET EVERY DAY   METHYLPREDNISOLONE (MEDROL) 8 MG TABLET methylPREDNISolone (MEDROL) 8 MG tablet      Take as directed by physician    Take as directed by physician   UMECLIDINIUM BROMIDE (INCRUSE ELLIPTA) 62.5 MCG/INH AEPB Umeclidinium Bromide (INCRUSE ELLIPTA) 62.5 MCG/INH AEPB      Inhale 1 puff into the lungs daily.    Inhale 1 puff into the lungs daily.  Discontinued Medications   AZITHROMYCIN (ZITHROMAX Z-PAK) 250 MG TABLET    Use as directed   METHYLPREDNISOLONE (MEDROL) 8 MG TABLET    Take 2 tablets daily x 1 week, then 1 tablet daily until next OV   PREDNISONE (STERAPRED UNI-PAK 21 TAB) 10 MG (21) TBPK TABLET    Take 1 tablet (10 mg total) by mouth daily. Use as directed

## 2015-05-02 NOTE — Telephone Encounter (Signed)
Rx's signed by SN and placed in completed envelope stating Rx's for pick up. This has been placed up front bin for pick up. Per Mindy pt is aware to pick up and no phone call to pt needed.

## 2015-05-07 NOTE — Progress Notes (Signed)
Chief Complaint  Patient presents with  . Follow-up  . Hyperlipidemia     History of Present Illness: 65 yo male with history of CAD, HLD, HTN, OSA here today for cardiac follow up. He has been followed in the past by Dr. Haroldine Laws. 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 He also has residual 50% left main stenosis and nonobstructive disease elsewhere. Cardiac CT in 05/2009 with LM < 50% LAD 50% LCX dominant nonobs RCA small no critical lesions. Small lung nodule. F/u CT showed stable pulm nodule. Carotid u/s 11/11 with minimal carotid plaque. Exercise treadmill stress test 08/30/13 but did not achieve target heart rate. Lexiscan stress myoview 10/03/13 with inferolateral scar, no ischemia, LVEF=52%.   He is here today for follow up. He is doing well. He has been exercising every day on the treadmill. Over the last 6 weeks he has been battling bronchitis and pneumonia. No CP or SOB.   Primary Care Physician: [name], MD   Past Medical History  Diagnosis Date  . CAD     --8/09: ETT normal --acute inferior lateral wall infarction in September 2004 treated medically.  --cardiac CT 2006  residual 50% left main stenosis and nonobstructive disease elsewhere. EF normal   --cardiac CT 05/2009. LM. <50% LAD. 50%  . HTN (hypertension)   . Hyperlipidemia   . Pharyngitis   . URI (upper respiratory infection)   . OSA (obstructive sleep apnea)   . Recurrent aspiration bronchitis/pneumonia ([place])   . Diabetes mellitus   . GERD (gastroesophageal reflux disease)   . Diverticulosis   . Colonic polyp   . DJD (degenerative joint disease)   . Low back pain syndrome   . Anxiety   . Myocardial infarction [facility]) 2004    Past Surgical History  Procedure Laterality Date  . Uvulopalatopharyngoplasty      surgery for OSA  . Cardiac catheterization      Current Outpatient Prescriptions  Medication Sig Dispense Refill  . ALPRAZolam (XANAX) 0.5 MG tablet TAKE 1  TABLET BY MOUTH 3 TIMES A DAY AS NEEDED 90 tablet 3  . amLODipine (NORVASC) 5 MG tablet Take 1 tablet (5 mg total) by mouth daily. 90 tablet 1  . ammonium lactate (LAC-HYDRIN) 12 % lotion Apply topically as directed. 400 g 3  . atorvastatin (LIPITOR) 40 MG tablet Take 1 tablet (40 mg total) by mouth daily. 90 tablet 1  . b complex vitamins tablet Take 1 tablet by mouth daily.    . budesonide-formoterol (SYMBICORT) 160-4.5 MCG/ACT inhaler INHALE 2 PUFFS INTO THE LUNGS 2 (TWO) TIMES DAILY. 3 Inhaler 3  . buPROPion (WELLBUTRIN SR) 150 MG 12 hr tablet Take 1 tablet (150 mg total) by mouth 2 (two) times daily. 180 tablet 1  . carvedilol (COREG) 12.5 MG tablet TAKE 1 TABLET BY MOUTH TWICE A DAY WITH A MEAL 180 tablet 1  . cetirizine (ZYRTEC) 10 MG tablet Take 1 tablet (10 mg total) by mouth daily. 30 tablet 11  . Cholecalciferol (VITAMIN D3) 5000 UNITS CAPS Take one tablet by mouth daily    . clopidogrel (PLAVIX) 75 MG tablet Take 1 tablet (75 mg total) by mouth daily. 90 tablet 1  . clotrimazole-betamethasone (LOTRISONE) cream APPLY AS DIRECTED 45 g 11  . Coenzyme Q10 (COQ10) 100 MG CAPS Take as directed 30 each 11  . cyanocobalamin 2000 MCG tablet Take 1 tablet (2,000 mcg total) by mouth daily. 30 tablet 11  . furosemide (LASIX)  40 MG tablet Take 1 tablet (40 mg total) by mouth daily as needed. 90 tablet 3  . HYDROcodone-homatropine (HYCODAN) 5-1.5 MG/5ML syrup Take 5 mLs by mouth every 6 (six) hours as needed for cough. 240 mL 0  . nitroGLYCERIN (NITROSTAT) 0.4 MG SL tablet Place 1 tablet (0.4 mg total) under the tongue every 5 (five) minutes as needed. For up to 3 doses. 25 tablet 6  . pantoprazole (PROTONIX) 40 MG tablet Take 1 tablet (40 mg total) by mouth daily. About 30 minutes before dinner time 90 tablet 1  . sildenafil (VIAGRA) 100 MG tablet TAKE 1 TABLET BY MOUTH AS NEEDED FOR ERECTILE DYSFUNCTION. 15 tablet 0  . Simethicone (GAS-X EXTRA STRENGTH) 125 MG CAPS Take 125 mg by mouth daily. Prn     . Umeclidinium Bromide (INCRUSE ELLIPTA) 62.5 MCG/INH AEPB Inhale 1 puff into the lungs daily. 3 each 3   Current Facility-Administered Medications  Medication Dose Route Frequency Provider Last Rate Last Dose  . triamcinolone acetonide (KENALOG) 10 MG/ML injection 10 mg  10 mg Other Once Wallene Huh, DPM        Allergies  Allergen Reactions  . Codeine Nausea And Vomiting  . Irbesartan Other (See Comments)    REACTION: allergic to ARBs w/ angioedema  . Ramipril Other (See Comments)    REACTION: Allergic to ACE's w/ angioedema...  . Aspirin Hives    Social History   Social History  . Marital Status: Married    Spouse Name: N/A  . Number of Children: 1  . Years of Education: N/A   Occupational History  . [employer] [place] History Main Topics  . Smoking status: Former Smoker -- 1.00 packs/day for 20 years    Types: Cigarettes, Cigars    Quit date: 05/08/1998  . Smokeless tobacco: Never Used     Comment: quit in 2005  . Alcohol Use: 0.6 oz/week    1 Standard drinks or equivalent per week     Comment: social drinker  . Drug Use: No  . Sexual Activity: Yes   Other Topics Concern  . Not on file   Social History Narrative    Family History  Problem Relation Age of Onset  . Colon cancer Father   . Stroke Mother   . Heart disease Mother   . Hypertension Sister   . Diabetes Sister     Review of Systems:  As stated in the HPI and otherwise negative.   BP 130/68 mmHg  Pulse 66  Ht 5\' 10"  (1.778 m)  Wt 216 lb 3.2 oz (98.068 kg)  BMI 31.02 kg/m2  SpO2 97%  Physical Examination: General: Well developed, well nourished, NAD HEENT: OP clear, mucus membranes moist SKIN: warm, dry. No rashes. Neuro: No focal deficits Musculoskeletal: Muscle strength 5/5 all ext Psychiatric: Mood and affect normal Neck: No JVD, no carotid bruits, no thyromegaly, no lymphadenopathy. Lungs:Clear bilaterally, no wheezes, rhonci,  crackles Cardiovascular: Regular rate and rhythm. No murmurs, gallops or rubs. Abdomen:Soft. Bowel sounds present. Non-tender.  Extremities: No lower extremity edema. Pulses are 2 + in the bilateral DP/PT.  Echo 08/30/13: Left ventricle: The cavity size was normal. Systolic function was normal. The estimated ejection fraction was in the range of 55% to 60%. Probable mild hypokinesis of the basal-midinferolateral and inferior myocardium. Doppler parameters are consistent with abnormal left ventricular relaxation (grade 1 diastolic dysfunction). - Mitral valve: Calcified annulus.  Stress myoview 10/03/13: Stress Procedure: The patient received IV  Lexiscan 0.4 mg over 15-seconds. Technetium 29m Sestamibi injected at 30-seconds. This patient had sob and an "Uncomfortable Fullness" with the Lexiscan injection. Quantitative spect images were obtained after a 45 minute delay.  Stress ECG: No significant change from baseline ECG  QPS  Raw Data Images: Normal; no motion artifact; normal heart/lung ratio.  Stress Images: There is a large size severe severity perfusion defect in the basal and mid inferior and inferolateral walls.  Rest Images: There is a large size severe severity perfusion defect in the basal and mid inferior and inferolateral walls.  Subtraction (SDS): SDS 2  Transient Ischemic Dilatation (Normal <1.22): 1.06  Lung/Heart Ratio (Normal <0.45): 0.32  Quantitative Gated Spect Images  QGS EDV: 135 ml  QGS ESV: 65 ml  Impression  Exercise Capacity: Lexiscan with no exercise.  BP Response: Normal blood pressure response.  Clinical Symptoms: No significant symptoms noted.  ECG Impression: No significant ST segment change suggestive of ischemia.  Comparison with Prior Nuclear Study: No significant change from previous study  Overall Impression: Intermediate risk stress nuclear study with a large infarct in the basal and mid inferior and inferolateral walls with mild periinfarct  ischemia in the apical inferior wall (SDS 2)..  LV Ejection Fraction: 52%. LV Wall Motion: akinesis of the basal and mid inferior and inferolateral walls.   EKG:  EKG is not ordered today. The ekg ordered today demonstrates .   Recent Labs: 10/15/2014: ALT 19; BUN 17; Creatinine, Ser 1.00; Potassium 4.5; Sodium 139   Lipid Panel    Component Value Date/Time   CHOL 130 10/15/2014 1054   TRIG 53.0 10/15/2014 1054   HDL 47.30 10/15/2014 1054   CHOLHDL 3 10/15/2014 1054   VLDL 10.6 10/15/2014 1054   LDLCALC 72 10/15/2014 1054     Wt Readings from Last 3 Encounters:  05/08/15 216 lb 3.2 oz (98.068 kg)  05/01/15 213 lb 6.4 oz (96.798 kg)  04/04/15 214 lb 12.8 oz (97.433 kg)     Other studies Reviewed: Additional studies/ records that were reviewed today include: . Review of the above records demonstrates:    Assessment and Plan:  1. CAD: Stable. No chest pain. Stress test 10/03/13 shows inferolateral scar to be expected after his cardiac event in 2004 with occluded OM branch. No other ischemia. LV function normal.  Continue current regimen. Refill NTG today.   2. HYPERTENSION:  Blood pressure well controlled. Continue current regimen.   3. HYPERLIPIDEMIA: LDL is at goal. Continue statin.   Current medicines are reviewed at length with the patient today.  The patient does not have concerns regarding medicines.  The following changes have been made:  no change  Labs/ tests ordered today include:  No orders of the defined types were placed in this encounter.    Disposition:   FU with me in 6 months  Signed, Lauree Chandler, MD 05/08/2015 10:14 PM    [address], [place], [place]  [address] Phone: [phone]; Fax: [phone]

## 2015-05-08 ENCOUNTER — Encounter: Payer: Self-pay | Admitting: Cardiovascular Disease

## 2015-05-08 ENCOUNTER — Ambulatory Visit (INDEPENDENT_AMBULATORY_CARE_PROVIDER_SITE_OTHER): Payer: Commercial Managed Care - HMO | Admitting: Cardiovascular Disease

## 2015-05-08 VITALS — BP 130/68 | HR 66 | Ht 70.0 in | Wt 216.2 lb

## 2015-05-08 DIAGNOSIS — E785 Hyperlipidemia, unspecified: Secondary | ICD-10-CM

## 2015-05-08 DIAGNOSIS — I2510 Atherosclerotic heart disease of native coronary artery without angina pectoris: Secondary | ICD-10-CM | POA: Diagnosis not present

## 2015-05-08 DIAGNOSIS — I10 Essential (primary) hypertension: Secondary | ICD-10-CM | POA: Diagnosis not present

## 2015-05-08 MED ORDER — NITROGLYCERIN 0.4 MG SL SUBL: 0.4000 mg | SUBLINGUAL_TABLET | SUBLINGUAL | Status: DC | PRN

## 2015-05-08 NOTE — Patient Instructions (Signed)

## 2015-06-30 ENCOUNTER — Encounter: Payer: Self-pay | Admitting: Pulmonary Disease

## 2015-06-30 ENCOUNTER — Ambulatory Visit (INDEPENDENT_AMBULATORY_CARE_PROVIDER_SITE_OTHER): Payer: Medicare Other | Admitting: Pulmonary Disease

## 2015-06-30 VITALS — BP 132/70 | HR 64 | Temp 97.4°F | Ht 70.0 in | Wt 221.8 lb

## 2015-06-30 DIAGNOSIS — I872 Venous insufficiency (chronic) (peripheral): Secondary | ICD-10-CM

## 2015-06-30 DIAGNOSIS — I2510 Atherosclerotic heart disease of native coronary artery without angina pectoris: Secondary | ICD-10-CM | POA: Diagnosis not present

## 2015-06-30 DIAGNOSIS — G4733 Obstructive sleep apnea (adult) (pediatric): Secondary | ICD-10-CM

## 2015-06-30 DIAGNOSIS — R760 Raised antibody titer: Secondary | ICD-10-CM

## 2015-06-30 DIAGNOSIS — R0989 Other specified symptoms and signs involving the circulatory and respiratory systems: Secondary | ICD-10-CM

## 2015-06-30 DIAGNOSIS — R198 Other specified symptoms and signs involving the digestive system and abdomen: Secondary | ICD-10-CM

## 2015-06-30 DIAGNOSIS — I10 Essential (primary) hypertension: Secondary | ICD-10-CM

## 2015-06-30 DIAGNOSIS — K219 Gastro-esophageal reflux disease without esophagitis: Secondary | ICD-10-CM

## 2015-06-30 DIAGNOSIS — R6889 Other general symptoms and signs: Secondary | ICD-10-CM

## 2015-06-30 DIAGNOSIS — J449 Chronic obstructive pulmonary disease, unspecified: Secondary | ICD-10-CM | POA: Diagnosis not present

## 2015-06-30 DIAGNOSIS — R768 Other specified abnormal immunological findings in serum: Secondary | ICD-10-CM

## 2015-06-30 DIAGNOSIS — K5730 Diverticulosis of large intestine without perforation or abscess without bleeding: Secondary | ICD-10-CM

## 2015-06-30 NOTE — Progress Notes (Signed)
Subjective:    Patient ID: Larry Stone, male    DOB: [birth year], 65 y.o.   MRN: [id number]  HPI 65 y/o WM here for a followup visit... he has multiple medical problems as noted below...  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 and DrPlotnikov for Primary Care- Chol, Impaired fasting glucose, etc... ~  SEE PREV EPIC NOTES FOR OLDER DATA >>    CXR 1/14 showed normal heart size, clear lungs, chr right rib deformity (old fx), NAD...  LABS 1/14:  FLP- at goals on Lip40+Niasp;  Chems- wnl;  CBC- wnl;  TSH=1.93;  PSA=0.39;  Testos=307 (350-890) on Testim 2tubes/d;  B12=1005 (211-911) on B12 2080mcg/d...  LABS 4/15:  FLP- at goals on Lip40;  Chems- ok w/ BS=115, A1c=6.0, Cr=1.2;  CBC- wnl;  TSH=1.55;  PSA=0.39...   ~  December 13, 2013:  90mo ROV & Larry Stone has established w/ DrPlotnikov for Primary Care, seen 5/15 & note reviewed...  He requested Pulm f/u visit due to concern over several recurrent bronchitic infections over the last few months- treated w/ ZPaks and improved but he & wife are concerned as to why he is getting these; he uses OTC Zyrtek10mg /d, he is an ex-smoker (1ppd for 83yrs, quit 2004), hx tiny 36mm nodule in RML on prior CT Chest along w/ mild centrilob emphysema & no changes serially- last CXR 1/14 w/ norm heart size, clear lungs x mild bronchitic change, and chr right posterolat rib deformity... He is c/o several bouts of bronchitis w/ cough, beige sput, denies f/c/s, but feels tight/ congested/ occas wheezing & SOB, no CP;  I cannot find prev PFTs and we discussed f/u CXR, PFT, and Labs to check for the unlikely presence of an immune defic syndrome... We reviewed prob list, meds, xrays and labs> see below for updates >>   CXR 8/15 showed normal heart size, clear lungs w/o infiltrates, old healed right rib fxs, NAD...   PFT 8/15 showed FVC=2.32 (50%), FEV1=1.63 (45%),  %1sec=70, mid-flows=33% predicted; suggestive of GOLD Stage 3 COPD but can't r/o superimposed restriction w/o lung volume measurement...  LABS 8/15:  SPE/IEP w/o monoclonal gammopathy; Quant Ig's showed normal IgG, normal IgA, low IgM at 26 (40-250), and a hi IgE at 1362 => we will follow w/ RAST panel & consider Xolair Rx...  RAST Panel 9/15 => see below... PLAN>>  We discussed starting regular dosing of an inhaled ICS/LABA combination=> try DULERA100- 2spBid; ROV in 3 mo & we will plan Full PFTs at that time...  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  ~  March 18, 2014:  47mo ROV & f/u dyspnea> Larry Stone states that the [facility] was $400 at the Pharm & they/he didn't call for replacement inhaler, states he filled it one time & ran out several wks ago, we discussed what to do in that eventuality (call us to let us know & get a different Rx), we will try SYMBICORT160-2spBid & he will let us know what his [employer] co-pay is for this med;  Similarly he did not return to get the RAST tests done, but he informs me that they were drawn several days ago w/ blood  work for [employer]- results pending...  We reviewed his prev Hx/ symptoms, exam (chest is clear today), and evaluation; all questions answered, we decided to try the Symbicort160 regularly over the next 58mo & plan f/u Full-PFTs in the future;  In the meanwhile we will await the results of the RAST tests and decide regarding Xolair vs Allergy evaluation...    We reviewed prob list, meds, xrays and labs> see below for updates >> He was given the 2015 flu vaccine recently and the Prevnar-13 vaccination as well...  LABS 11/15:  FLP- at goals on Lip40;  Chems- wnl w/ BS=105, A1c=6.1;  CBC- wnl w/ Hg=15.1;  TSH=1.77, PSA=0.50...   RAST Test 9/15:  returned w/  IgE level = 1497 and Allergen panel pos for some molds, grasses, trees, ragweed, cats>dogs; and Food panel pos for Milk> Wheat> Shrimp/ Tomato/ Peanut, etc... We discussed avoidance strategies and other options- he is interested in Xolair & we will proceed w/ the paperwork for Xolair approval...   ~  October 15, 2014:  60mo ROV & Larry Stone is still awaiting insurance approval for Xolair!  He reports removing 2 ticks from his privates recently!  Weight down 12# on diet and he notes some improvement in his dyspnea- now just noted w/ intense activ he says eg- climbing/ stairs/ etc but walking is ok...     OSA> s/p UPPP surg 2004; uses Transderm scope prn dizziness, uses "breathe right nasal strips" periodically; resting OK, no daytime hypersomnolence...    COPD & HxBronchitis> on Symbicort160-2Bid & Zyrtek prn; doing satis w/o cough, phlegm, etc...    Elev IgE at 1362 & 1497; see RAST results above;  We are awaiting approval for Xolair...    His last Cards check was 12/25 w/ DrMcAlhany> Hx MI in 2004 w/ resid 50% Lmain; had CardiacCT 2011 w/ 50% LAD, Myoview 2015 w/ inferolat scar, no ischemia, EF=52%; clinically stable & not c/o CP & no changes made...    He had medical f/u DrPlotnikov 5/16> HBP, CAD, HL, anxiety- all stable on meds and no changes made...    He continues to f/u w/ Podiatry- DrRegal> seen 4/16 for heel pain, plantar faciitis- injected & given brace + oral anti-inflamm rx... We reviewed prob list, meds, xrays and labs> see below for updates >>  ADDENDUM>>  [name] was denied by his insurance company...  ~  April 04, 2015:  35mo ROV & Don called for an add-on appt >  He is c/o a 68mo hx sudden onset SOB/DOE, fatigue, cough & chest congestion w/ sl beige phlegm, no hemoptysis, no CP x sore from coughing, and no f/c/s;  We called in Pred dosepak but he says no better, not resting, ?cough worse Qhs, incr SOB & he denies any reflux/ GERD, etc... He has been using the Symbicort160-2spBid & Zyrtek10... EXAM  shows Afeb, VSS, O2sat=98% on RA;  HEENT- neg, mallamapti2;  Chest- decr BS at bases, dry cough, clear w/o w/r/r;  Heart- RR w/o m/r/g;  Abd- soft, neg;  Ext- neg w/o c/c/e...   CXR 04/04/15 showed norm heart size, clear lungs, old right rib fx, NAD...  IMP/PLAN>>  Larry Stone appears to have a refractory COPD exac & we discussed additional treatment w/ ZPak, Depo80/Medrol8mg  tapering sched, add INCRUSE one sp daily, Mucinex 1200mg Bid + fluids, and OK to use mother's old NEB machine  W/ Albut Tid;  Also wrote for [facility] for prn use...  ~  May 01, 2015:  90mo ROV & Don reports that he is  better overall from his refractory AB but cough is lingering w/ a definite night-time pattern;  Notes some wheezing & chest soreness;  He has been on mother's NEB machine Bid, Symbicort160-2spBid, Incruse daily, Medrol8 taper- down to 1/2Qod, Hycodan prn;  We decided to continue the Medrol4mg Qod & institute a vigorous antireflux regimen- Protonix40 taken 62min before dinner, NPO after dinner, elev HOB 6"... EXAM shows Afeb, VSS, O2sat=98% on RA;  HEENT- neg, mallamapti2;  Chest- decr BS at bases, min cough & exp wheezing w/o consolidation;  Heart- RR w/o m/r/g;  Abd- soft, neg;  Ext- neg w/o c/c/e...  IMP/PLAN>>  Larry Stone is improved but w/ persist symptoms esp cough at night- we decided to continue the Medrol4mg  Qod & institute a vigorous antireflux regimen... Plan ROV 6-8wks.  ~  June 30, 2015:  45mo ROV & Larry Stone reports that his breathing is better, min chest congestion & non-productive cough, still notes occas wheezing; no CP, no tightness, no f/c/s;  NOTE> HE HAS STOPPED THE MEDROL& STOPPED THE NEBS on his own, still using the Symbicort & Incruse regularly; rec to add MUCINEX600-2Bid w/ fluids & [facility]...  EXAM shows Afeb, VSS, O2sat=98% on RA;  HEENT- clears throat/ ?VCD, mallamapti2;  Chest- decr BS at bases, min cough & exp wheezing w/o consolidation;  Heart- RR w/o m/r/g;  Abd- soft, neg;   Ext- neg w/o c/c/e...  IMP/PLAN>>  We will refer to ENT for completeness & upper airway symptoms, throat clearing, etc; he needs to maintain a vigorous antireflux regimen + Symbicort/ Incruse/ Albut NEBS.Marland KitchenMarland Kitchen             Problem List:     ALLERGIC to ASA w/ hives, & ACE inhibitors/ ARBs too w/ angioedema.  OBSTRUCTIVE SLEEP APNEA (ICD-327.23) - s/p UPPP surgery in 2004 by DrRedman...  states he's doing OK, denies snoring, no daytime hypersom, wife not complaining... uses ZYRTEK for allergy symtoms. ~  1/13: He has started using the "breathe right nasal strips" qhs for recurrent snoring; still says he rests well, denies daytime hypersomnolence, & declines offer for repeat sleep study to check... ~  7/14:  He reports resting well, no daytime symptoms or sleep issues...  ~  8/15:  He denies sleep disordered breathing... ~  6/16:  He remains asymptomatic- no snoring, daytime hypersomnolence, etc...  Hx of BRONCHITIS, RECURRENT (ICD-491.9) - he is an ex-smoker, prev 1 ppd for 4yrs, quit in 2004. COPD, mixed type- w/ mild centrilobular emphysema on CT Chest 10/11, & chronic bronchitis clinically w/ recurrent infections... ~  Hx tiny RML nodule seen on Cardiac CT 2/11 & apparently unchanged from old Rew 2004... ~  10/11: f/u CT Chest for f/u tiny 52mm nodule RML- unchanged xyrs & benign, mild centilob emphysema, +coronary calcif, NAD... ~  1/13: he denies breathing problems, intercurrent infections, etc... ~  1/14:  CXR 1/14 showed normal heart size, clear lungs, chr right rib deformity (old fx), NAD.  ~  8/15: he was concerned about several recent bouts of bronchitis requiring ZPak for Rx> we checked labs and Immunoglobulins =>   Decided to start inhaler w/ DULERA100-2spBid...  ~  CXR 8/15 showed normal heart size, clear lungs w/o infiltrates, old healed right rib fxs, NAD. ~  PFT 8/15 showed FVC=2.32 (50%), FEV1=1.63 (45%), %1sec=70, mid-flows=33% predicted; suggestive of GOLD Stage 3 COPD but  can't r/o superimposed restriction w/o lung volume measurement. ~  LABS 8/15 showed SPE/IEP w/o monoclonal gammopathy; Quant Ig's showed normal  IgG, normal IgA, low IgM at 26 (40-250), and a hi IgE at 1362 => we will follow w/ RAST panel & consider Xolair Rx. ~  11/15: he didn't fill the [facility] due to cost- we decided to try SYMBICORT160-2spBid... ~  LABS 11/15 showed elev IgE level and mixed RAST panel allergens; discussed avoidance & he is a good cand for [employer] => insurance would NOT approve this med... ~  12/16: on Symbicort160-2spBid + using his mother's nebulizer +Albut; presented w/ refractory COPD exac=> treated w/ ZPak, Medrol taper, add Incruse & use NEB Tid, mucinex + Hycodan...  ~  1/17: on mother's NEB machine Bid, Symbicort160-2spBid, Incruse daily, Medrol8 taper- down to 1/2Qod, Hycodan prn;  We decided to continue the Medrol4mg Qod & institute a vigorous antireflux regimen- Protonix40 taken 49min before dinner, NPO after dinner, elev HOB 6".   MEDICAL PROBLEMS PER DrPlotnikov >>   HYPERTENSION (ICD-401.9) - on COREG 12.5mg Bid, AMLODIPINE 5mg /d, & LASIX 40mg - only as needed (& he hasn't needed any); BP= 116/72 & he denies CP, palpit, ch in SOB, edema...  ARTERIOSCLEROTIC HEART DISEASE (ICD-414.00) - on PLAVIX 75mg /d, allergic to ASA... followed by [facility] and seen 6/15 (note reviewed)... 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 ~  f/u Cardiac CT 2/11 showed>  Calcified plaque in the left main with < 50% stenosis.  Calcified plaque in the proximal and mid LAD with at most moderate (around 50%) stenosis in the mid LAD. ~  Vasc Screen per Cards 10/11 showed mild carotid plaques, norm AbdAo & ABIs...  HYPERLIPIDEMIA (ICD-272.4) - on LIPITOR 40mg /d, & CoQ10... Last FLP 11/15 on Lip40 showed TChol 153, TG 42, HDL 53, LDL 92  DIABETES MELLITUS, BORDERLINE (ICD-790.29) - on diet alone... Labs 11/15- weight= 224#, BS=105, A1c=6.1  GERD  (ICD-530.81) - he uses H2 blockers as needed.  DIVERTICULOSIS OF COLON (ICD-562.10) & COLONIC POLYPS (ICD-211.3) - his last polyps were ~63mm and removed in 2004= adenomatous... there is a +fam hx of colon cancer in his father who died at age 62... ~  Last colonoscopy 5/14 by DrKaplan (done w/ pt on Plavix rx)> mild divertics & 45mm polyp in desc colon removed (tubular adenoma) & repeat rec in 5 yrs.   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 ~  3/15: he is off the prev Testos replacement Rx> states he feels well, good energy, PSA=0.39  DEGENERATIVE JOINT DISEASE (ICD-715.90) - he reports doing fair- mostly c/o knees & hands ("I messed up my knee in a fall")... he saw Ortho ?who? on Glucosamine/ Chondroitin supplements.  LOW BACK PAIN SYNDROME (ICD-724.2)  MEMORY LOSS >>  ~  1/13: he states that his "memory is fuzzy" & "Clarity is missing"; occas he will swear that he said something but he really didn't; he's had prev scans etc & I have recommended a Neurology eval for his memory but he declines at this time "I'll think about it" he said...  ANXIETY (ICD-300.00) - on WELLBUTRIN 150mg Bid, & ALPRAZOLAM 0.5mg  Prn... increased stress w/ mother's stroke last year... he's the main care giver w/ 2 sisters who don't help much...   Health Maintenance: ~  Immuniztions:  he is encouraged to get the yearly Flu vaccine... given PNEUMOVAX 2/12 & PREVNAR-13 given 11/15... He reports a lot of travel & he's been to the travel clinic & had "every vaccine known to man"> TDAP 7/14 and Zoster 5/14...   Past Surgical History  Procedure Laterality Date  . Uvulopalatopharyngoplasty      surgery for OSA  . Cardiac catheterization      Outpatient Encounter Prescriptions as of 05/01/2015  Medication Sig  . ALPRAZolam (XANAX) 0.5 MG tablet TAKE 1 TABLET BY MOUTH 3 TIMES A DAY AS NEEDED  . ammonium lactate (LAC-HYDRIN) 12 % lotion Apply topically as directed.  Marland Kitchen b  complex vitamins tablet Take 1 tablet by mouth daily.  . budesonide-formoterol (SYMBICORT) 160-4.5 MCG/ACT inhaler INHALE 2 PUFFS INTO THE LUNGS 2 (TWO) TIMES DAILY.  Marland Kitchen buPROPion (WELLBUTRIN SR) 150 MG 12 hr tablet Take 1 tablet (150 mg total) by mouth 2 (two) times daily.  . cetirizine (ZYRTEC) 10 MG tablet Take 1 tablet (10 mg total) by mouth daily.  . Cholecalciferol (VITAMIN D3) 5000 UNITS CAPS Take one tablet by mouth daily  . clotrimazole-betamethasone (LOTRISONE) cream APPLY AS DIRECTED  . Coenzyme Q10 (COQ10) 100 MG CAPS Take as directed  . cyanocobalamin 2000 MCG tablet Take 1 tablet (2,000 mcg total) by mouth daily.  . furosemide (LASIX) 40 MG tablet Take 1 tablet (40 mg total) by mouth daily as needed.  Marland Kitchen HYDROcodone-homatropine (HYCODAN) 5-1.5 MG/5ML syrup Take 5 mLs by mouth every 6 (six) hours as needed for cough.  . methylPREDNISolone (MEDROL) 8 MG tablet Take as directed by physician  . sildenafil (VIAGRA) 100 MG tablet TAKE 1 TABLET BY MOUTH AS NEEDED FOR ERECTILE DYSFUNCTION.  . Simethicone (GAS-X EXTRA STRENGTH) 125 MG CAPS Prn  . Umeclidinium Bromide (INCRUSE ELLIPTA) 62.5 MCG/INH AEPB Inhale 1 puff into the lungs daily.  Marland Kitchen amLODipine (NORVASC) 5 MG tablet TAKE 1 TABLET EVERY DAY  . atorvastatin (LIPITOR) 40 MG tablet TAKE 1 TABLET EVERY DAY  . azithromycin (ZITHROMAX Z-PAK) 250 MG tablet Use as directed  . carvedilol (COREG) 12.5 MG tablet TAKE 1 TABLET BY MOUTH TWICE A DAY WITH A MEAL  . clopidogrel (PLAVIX) 75 MG tablet TAKE 1 TABLET EVERY DAY  . nitroGLYCERIN (NITROSTAT) 0.4 MG SL tablet Place 1 tablet (0.4 mg total) under the tongue every 5 (five) minutes as needed. For up to 3 doses.  . pantoprazole (PROTONIX) 40 MG tablet Take 1 tablet (40 mg total) by mouth daily. About 30 minutes before dinner time    Allergies  Allergen Reactions  . Codeine Nausea And Vomiting  . Irbesartan Other (See Comments)    REACTION: allergic to ARBs w/ angioedema  . Ramipril Other  (See Comments)    REACTION: Allergic to ACE's w/ angioedema...  . Aspirin Hives    Current Medications, Allergies, Past Medical History, Past Surgical History, Family History, and Social History were reviewed in [employer] record.    Review of Systems       See HPI - all other systems neg except as noted...       The patient complains of dyspnea on exertion and muscle weakness.  The patient denies anorexia, fever, weight loss, weight gain, vision loss, decreased hearing, hoarseness, chest pain, syncope, peripheral edema, prolonged cough, headaches, hemoptysis, abdominal pain, melena, hematochezia, severe indigestion/heartburn, hematuria, incontinence, suspicious skin lesions, transient blindness, difficulty walking, depression, unusual weight change, abnormal bleeding, enlarged lymph nodes, and angioedema.     Objective:   Physical Exam    WD, sl overweight, 65 y/o WM in NAD... GENERAL:  Alert & oriented; pleasant & cooperative... HEENT:  [place]/AT, EOM-wnl, PERRLA, EACs-clear, TMs-wnl, NOSE-clear, THROAT-s/p UPPP surg... NECK:  Supple w/ fairROM; no JVD; normal carotid impulses w/o bruits; no thyromegaly  or nodules palpated; no lymphadenopathy. CHEST:  Clear to P & A; without wheezes/ rales/ or rhonchi. HEART:  Regular Rhythm; without murmurs/ rubs/ or gallops. ABDOMEN:  Soft & nontender; normal bowel sounds; no organomegaly or masses detected. EXT: without deformities, mild arthritic changes; no varicose veins/ venous insuffic/ or edema. Neuro:  intact w/o focal abn detected... DERM:  No lesions noted; no rash etc...  RADIOLOGY DATA:  Reviewed in the EPIC EMR & discussed w/ the patient...   LABORATORY DATA:  Reviewed in the EPIC EMR & discussed w/ the patient...     Assessment & Plan:    Hx OSA, s/p UPPP in 2004>  Prev noted recurrent snoring & using breathe right nasal strips; he didn't want repeat sleep study or further eval, just want Rx for the strips so  he can pay for them w/ his flex acct...   Hx recurrent bronchitis, underlying COPD (mixed type), COPD exac>   11/15> He quit smoking in 2004 & c/o recurrent bronchitic exacerbations=> PFT shows mod obstructive dis & we tried DULERA but too $$$ therefore switch to SYMBICORT160-2spBid regularly w/ f/u PFTs later... 6/16> Xolair was denied by insurance despite his IgE level= 1400... 12/16> presented w/ refractory COPD exac; treated w/ ZPak, Medroltaper, Incruse, Mucinex, fluids, Hycodan...   Medical issues per DrPlotnikov>  HBP, CAD s/pMI, VI/ edema, HL, IFG, GERD/ Divertics/ Polyps, GU/ Low-T, DJD/ LBP, Anxiety...    MULT MEDS REFILLED TODAY AT Pt's REQUEST >>  Patient's Medications  New Prescriptions   No medications on file  Previous Medications   ALPRAZOLAM (XANAX) 0.5 MG TABLET    TAKE 1 TABLET BY MOUTH 3 TIMES A DAY AS NEEDED   AMLODIPINE (NORVASC) 5 MG TABLET    Take 1 tablet (5 mg total) by mouth daily.   AMMONIUM LACTATE (LAC-HYDRIN) 12 % LOTION    Apply topically as directed.   ATORVASTATIN (LIPITOR) 40 MG TABLET    Take 1 tablet (40 mg total) by mouth daily.   B COMPLEX VITAMINS TABLET    Take 1 tablet by mouth daily.   BUDESONIDE-FORMOTEROL (SYMBICORT) 160-4.5 MCG/ACT INHALER    INHALE 2 PUFFS INTO THE LUNGS 2 (TWO) TIMES DAILY.   BUPROPION (WELLBUTRIN SR) 150 MG 12 HR TABLET    Take 1 tablet (150 mg total) by mouth 2 (two) times daily.   CARVEDILOL (COREG) 12.5 MG TABLET    TAKE 1 TABLET BY MOUTH TWICE A DAY WITH A MEAL   CETIRIZINE (ZYRTEC) 10 MG TABLET    Take 1 tablet (10 mg total) by mouth daily.   CHOLECALCIFEROL (VITAMIN D3) 5000 UNITS CAPS    Take one tablet by mouth daily   CLOPIDOGREL (PLAVIX) 75 MG TABLET    Take 1 tablet (75 mg total) by mouth daily.   CLOTRIMAZOLE-BETAMETHASONE (LOTRISONE) CREAM    APPLY AS DIRECTED   COENZYME Q10 (COQ10) 100 MG CAPS    Take as directed   CYANOCOBALAMIN 2000 MCG TABLET    Take 1 tablet (2,000 mcg total) by mouth daily.   FUROSEMIDE  (LASIX) 40 MG TABLET    Take 1 tablet (40 mg total) by mouth daily as needed.   NITROGLYCERIN (NITROSTAT) 0.4 MG SL TABLET    Place 1 tablet (0.4 mg total) under the tongue every 5 (five) minutes as needed. For up to 3 doses.   PANTOPRAZOLE (PROTONIX) 40 MG TABLET    Take 1 tablet (40 mg total) by mouth daily. About 30 minutes before dinner time  SILDENAFIL (VIAGRA) 100 MG TABLET    TAKE 1 TABLET BY MOUTH AS NEEDED FOR ERECTILE DYSFUNCTION.   SIMETHICONE (GAS-X EXTRA STRENGTH) 125 MG CAPS    Take 125 mg by mouth daily. Prn   UMECLIDINIUM BROMIDE (INCRUSE ELLIPTA) 62.5 MCG/INH AEPB    Inhale 1 puff into the lungs daily.  Modified Medications   No medications on file  Discontinued Medications   HYDROCODONE-HOMATROPINE (HYCODAN) 5-1.5 MG/5ML SYRUP    Take 5 mLs by mouth every 6 (six) hours as needed for cough.

## 2015-06-30 NOTE — Patient Instructions (Signed)
Today we updated your med list in our EPIC system...    Continue your current medications the same...  Continue the Symbicort, Incruse, and your antireflux regimen...  We will arrange for an ENT check of your larynx for completeness due to the throat clearing & occas upper airway wheezing that is heard...  Call for any questions...  Let's plan a follow up visit in 28mo, sooner if needed for problems.Marland KitchenMarland Kitchen

## 2015-07-07 DIAGNOSIS — K219 Gastro-esophageal reflux disease without esophagitis: Secondary | ICD-10-CM | POA: Insufficient documentation

## 2015-08-03 ENCOUNTER — Other Ambulatory Visit: Payer: Self-pay | Admitting: Internal Medicine

## 2015-08-11 ENCOUNTER — Telehealth: Payer: Self-pay | Admitting: Pulmonary Disease

## 2015-08-11 DIAGNOSIS — J449 Chronic obstructive pulmonary disease, unspecified: Secondary | ICD-10-CM

## 2015-08-11 NOTE — Telephone Encounter (Signed)
Per SN- Come in tomorrow at 12:00 with cxr prior.  If s/s worsen between then and now go to ED.   Spoke with pt, aware of recs.  cxr ordered, ov scheduled.  Nothing further needed.

## 2015-08-11 NOTE — Telephone Encounter (Signed)
Pt states he believes he has PNA- c/o temp of 103, chills, body aches, fatigue, nonprod cough since yesterday afternoon.    Pt has taken prednisone taper last week- had started showing symptoms last Saturday, took pred taper and was feeling better, but now is feeling worse than before.   Pt would like ov with SN if possible.  No open appts this week.  [facility] uses [facility] on [address].    SN please advise on recs.  Thanks!   Allergies  Allergen Reactions  . Codeine Nausea And Vomiting  . Irbesartan Other (See Comments)    REACTION: allergic to ARBs w/ angioedema  . Ramipril Other (See Comments)    REACTION: Allergic to ACE's w/ angioedema...  . Aspirin Hives

## 2015-08-12 ENCOUNTER — Ambulatory Visit (INDEPENDENT_AMBULATORY_CARE_PROVIDER_SITE_OTHER)
Admission: RE | Admit: 2015-08-12 | Discharge: 2015-08-12 | Disposition: A | Discharge status: Home / Self Care | Payer: Medicare Other | Source: Ambulatory Visit | Attending: Pulmonary Disease | Admitting: Pulmonary Disease

## 2015-08-12 ENCOUNTER — Other Ambulatory Visit (INDEPENDENT_AMBULATORY_CARE_PROVIDER_SITE_OTHER): Payer: Medicare Other

## 2015-08-12 ENCOUNTER — Ambulatory Visit (INDEPENDENT_AMBULATORY_CARE_PROVIDER_SITE_OTHER): Payer: Medicare Other | Admitting: Pulmonary Disease

## 2015-08-12 ENCOUNTER — Encounter: Payer: Self-pay | Admitting: Pulmonary Disease

## 2015-08-12 VITALS — BP 132/74 | HR 72 | Temp 98.2°F | Ht 70.0 in | Wt 210.8 lb

## 2015-08-12 DIAGNOSIS — I10 Essential (primary) hypertension: Secondary | ICD-10-CM | POA: Diagnosis not present

## 2015-08-12 DIAGNOSIS — R768 Other specified abnormal immunological findings in serum: Secondary | ICD-10-CM

## 2015-08-12 DIAGNOSIS — J4489 Other specified chronic obstructive pulmonary disease: Secondary | ICD-10-CM

## 2015-08-12 DIAGNOSIS — R760 Raised antibody titer: Secondary | ICD-10-CM

## 2015-08-12 DIAGNOSIS — I2510 Atherosclerotic heart disease of native coronary artery without angina pectoris: Secondary | ICD-10-CM | POA: Diagnosis not present

## 2015-08-12 DIAGNOSIS — J42 Unspecified chronic bronchitis: Secondary | ICD-10-CM

## 2015-08-12 DIAGNOSIS — J449 Chronic obstructive pulmonary disease, unspecified: Secondary | ICD-10-CM

## 2015-08-12 DIAGNOSIS — J441 Chronic obstructive pulmonary disease with (acute) exacerbation: Secondary | ICD-10-CM

## 2015-08-12 DIAGNOSIS — G4733 Obstructive sleep apnea (adult) (pediatric): Secondary | ICD-10-CM

## 2015-08-12 LAB — COMPREHENSIVE METABOLIC PANEL
ALT: 12 U/L (ref 0–53)
AST: 11 U/L (ref 0–37)
Albumin: 3.8 g/dL (ref 3.5–5.2)
Alkaline Phosphatase: 82 U/L (ref 39–117)
BILIRUBIN TOTAL: 1.8 mg/dL — AB (ref 0.2–1.2)
BUN: 20 mg/dL (ref 6–23)
CALCIUM: 9.1 mg/dL (ref 8.4–10.5)
CO2: 28 meq/L (ref 19–32)
Chloride: 97 mEq/L (ref 96–112)
Creatinine, Ser: 1.2 mg/dL (ref 0.40–1.50)
GFR: 64.54 mL/min (ref >60.00)
GLUCOSE: 121 mg/dL — AB (ref 70–99)
POTASSIUM: 4.2 meq/L (ref 3.5–5.1)
Sodium: 134 mEq/L — ABNORMAL LOW (ref 135–145)
Total Protein: 7.1 g/dL (ref 6.0–8.3)

## 2015-08-12 LAB — CBC WITH DIFFERENTIAL/PLATELET
BASOS PCT: 0.1 % (ref 0.0–3.0)
Basophils Absolute: 0 10*3/uL (ref 0.0–0.1)
EOS ABS: 0.2 10*3/uL (ref 0.0–0.7)
EOS PCT: 1.4 % (ref 0.0–5.0)
HCT: 43.8 % (ref 39.0–52.0)
Hemoglobin: 14.9 g/dL (ref 13.0–17.0)
LYMPHS ABS: 1.1 10*3/uL (ref 0.7–4.0)
Lymphocytes Relative: 7 % — ABNORMAL LOW (ref 12.0–46.0)
MCHC: 34.1 g/dL (ref 30.0–36.0)
MCV: 94.8 fl (ref 78.0–100.0)
MONO ABS: 1.2 10*3/uL — AB (ref 0.1–1.0)
Monocytes Relative: 7.8 % (ref 3.0–12.0)
NEUTROS ABS: 13.1 10*3/uL — AB (ref 1.4–7.7)
NEUTROS PCT: 83.7 % — AB (ref 43.0–77.0)
PLATELETS: 165 10*3/uL (ref 150.0–400.0)
RBC: 4.62 Mil/uL (ref 4.22–5.81)
RDW: 12.9 % (ref 11.5–15.5)
WBC: 15.7 10*3/uL — AB (ref 4.0–10.5)

## 2015-08-12 LAB — BRAIN NATRIURETIC PEPTIDE: PRO B NATRI PEPTIDE: 35 pg/mL (ref 0.0–100.0)

## 2015-08-12 LAB — SEDIMENTATION RATE: SED RATE: 41 mm/h — AB (ref 0–22)

## 2015-08-12 MED ORDER — PREDNISONE 20 MG PO TABS: ORAL_TABLET | ORAL | Status: DC

## 2015-08-12 MED ORDER — LEVOFLOXACIN 750 MG PO TABS: 750.0000 mg | ORAL_TABLET | Freq: Every day | ORAL | Status: DC

## 2015-08-12 MED ORDER — METHYLPREDNISOLONE ACETATE 80 MG/ML IJ SUSP
80.0000 mg | Freq: Once | INTRAMUSCULAR | Status: AC
  Administered 2015-08-12: 80 mg via INTRAMUSCULAR

## 2015-08-12 MED ORDER — IPRATROPIUM-ALBUTEROL 0.5-2.5 (3) MG/3ML IN SOLN: 3.0000 mL | Freq: Three times a day (TID) | RESPIRATORY_TRACT | Status: DC

## 2015-08-12 NOTE — Progress Notes (Signed)
Subjective:    Patient ID: Larry Stone, male    DOB: [birth year], 65 y.o.   MRN: [id number]  HPI 65 y/o WM here for a followup visit... he has multiple medical problems as noted below...  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 and DrPlotnikov for Primary Care- Chol, Impaired fasting glucose, etc... ~  SEE PREV EPIC NOTES FOR OLDER DATA >>    CT Chest 01/2010>>  1.62mm right middle lobe lung nodule is similar back to the abdominal CT of 02/16/2003, consistent with benignity, most likely a subpleural lymph node;  2. No acute process in the chest;  3 Distal esophageal wall thickening again suggests esophagitis;  4.Mild centrilobular emphysema;  5.Age advanced atherosclerosis.  CXR 1/14 showed normal heart size, clear lungs, chr right rib deformity (old fx), NAD...  LABS 1/14:  FLP- at goals on Lip40+Niasp;  Chems- wnl;  CBC- wnl;  TSH=1.93;  PSA=0.39;  Testos=307 (350-890) on Testim 2tubes/d;  B12=1005 (211-911) on B12 2053mcg/d...  LABS 4/15:  FLP- at goals on Lip40;  Chems- ok w/ BS=115, A1c=6.0, Cr=1.2;  CBC- wnl;  TSH=1.55;  PSA=0.39...   ~  December 13, 2013:  33mo ROV & Larry Stone has established w/ DrPlotnikov for Primary Care, seen 5/15 & note reviewed...  He requested Pulm f/u visit due to concern over several recurrent bronchitic infections over the last few months- treated w/ ZPaks and improved but he & wife are concerned as to why he is getting these; he uses OTC Zyrtek10mg /d, he is an ex-smoker (1ppd for 78yrs, quit 2004), hx tiny 24mm nodule in RML on prior CT Chest along w/ mild centrilob emphysema & no changes serially- last CXR 1/14 w/ norm heart size, clear lungs x mild bronchitic change, and chr right posterolat rib deformity... He is c/o several bouts of bronchitis w/ cough, beige sput, denies f/c/s, but feels tight/ congested/ occas wheezing & SOB, no CP;  I cannot find  prev PFTs and we discussed f/u CXR, PFT, and Labs to check for the unlikely presence of an immune defic syndrome... We reviewed prob list, meds, xrays and labs> see below for updates >>   CXR 8/15 showed normal heart size, clear lungs w/o infiltrates, old healed right rib fxs, NAD...   PFT 8/15 showed FVC=2.32 (50%), FEV1=1.63 (45%), %1sec=70, mid-flows=33% predicted; suggestive of GOLD Stage 3 COPD but can't r/o superimposed restriction w/o lung volume measurement...  LABS 8/15:  SPE/IEP w/o monoclonal gammopathy; Quant Ig's showed normal IgG, normal IgA, low IgM at 26 (40-250), and a hi IgE at 1362 => we will follow w/ RAST panel & consider Xolair Rx...  RAST Panel 9/15 => see below... PLAN>>  We discussed starting regular dosing of an inhaled ICS/LABA combination=> try DULERA100- 2spBid; ROV in 3 mo & we will plan Full PFTs at that time...  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  ~  March 18, 2014:  81mo ROV & f/u dyspnea> Larry Stone states that the [facility] was $400 at the Pharm & they/he didn't call for replacement inhaler, states he filled it one time & ran out several wks ago, we discussed what to do in that eventuality (call  us to let us know & get a different Rx), we will try SYMBICORT160-2spBid & he will let us know what his [employer] co-pay is for this med;  Similarly he did not return to get the RAST tests done, but he informs me that they were drawn several days ago w/ blood work for [employer]- results pending...  We reviewed his prev Hx/ symptoms, exam (chest is clear today), and evaluation; all questions answered, we decided to try the Symbicort160 regularly over the next 50mo & plan f/u Full-PFTs in the future;  In the meanwhile we will await the results of the RAST tests and decide regarding  Xolair vs Allergy evaluation...    We reviewed prob list, meds, xrays and labs> see below for updates >> He was given the 2015 flu vaccine recently and the Prevnar-13 vaccination as well...  LABS 11/15:  FLP- at goals on Lip40;  Chems- wnl w/ BS=105, A1c=6.1;  CBC- wnl w/ Hg=15.1;  TSH=1.77, PSA=0.50...   RAST Test 9/15:  returned w/ IgE level = 1497 and Allergen panel pos for some molds, grasses, trees, ragweed, cats>dogs; and Food panel pos for Milk> Wheat> Shrimp/ Tomato/ Peanut, etc... We discussed avoidance strategies and other options- he is interested in Xolair & we will proceed w/ the paperwork for Xolair approval...   ~  October 15, 2014:  26mo ROV & Larry Stone is still awaiting insurance approval for Xolair!  He reports removing 2 ticks from his privates recently!  Weight down 12# on diet and he notes some improvement in his dyspnea- now just noted w/ intense activ he says eg- climbing/ stairs/ etc but walking is ok...     OSA> s/p UPPP surg 2004; uses Transderm scope prn dizziness, uses "breathe right nasal strips" periodically; resting OK, no daytime hypersomnolence...    COPD & HxBronchitis> on Symbicort160-2Bid & Zyrtek prn; doing satis w/o cough, phlegm, etc...    Elev IgE at 1362 & 1497; see RAST results above;  We are awaiting approval for Xolair...    His last Cards check was 12/25 w/ DrMcAlhany> Hx MI in 2004 w/ resid 50% Lmain; had CardiacCT 2011 w/ 50% LAD, Myoview 2015 w/ inferolat scar, no ischemia, EF=52%; clinically stable & not c/o CP & no changes made...    He had medical f/u DrPlotnikov 5/16> HBP, CAD, HL, anxiety- all stable on meds and no changes made...    He continues to f/u w/ Podiatry- DrRegal> seen 4/16 for heel pain, plantar faciitis- injected & given brace + oral anti-inflamm rx... We reviewed prob list, meds, xrays and labs> see below for updates >>  ADDENDUM>>  [name] was denied by his insurance company...  ~  April 04, 2015:  25mo ROV & Larry Stone called for an add-on appt >   He is c/o a 16mo hx sudden onset SOB/DOE, fatigue, cough & chest congestion w/ sl beige phlegm, no hemoptysis, no CP x sore from coughing, and no f/c/s;  We called in Pred dosepak but he says no better, not resting, ?cough worse Qhs, incr SOB & he denies any reflux/ GERD, etc... He has been using the Symbicort160-2spBid & Zyrtek10... EXAM shows Afeb, VSS, O2sat=98% on RA;  HEENT- neg, mallamapti2;  Chest- decr BS at bases, dry cough, clear w/o w/r/r;  Heart- RR w/o m/r/g;  Abd- soft, neg;  Ext- neg w/o c/c/e...   CXR 04/04/15 showed norm heart size, clear lungs, old right rib fx, NAD...  IMP/PLAN>>  Larry Stone appears to have a refractory COPD exac &  we discussed additional treatment w/ ZPak, Depo80/Medrol8mg  tapering sched, add INCRUSE one sp daily, Mucinex 1200mg Bid + fluids, and OK to use mother's old NEB machine  W/ Albut Tid;  Also wrote for [facility] for prn use...  ~  May 01, 2015:  44mo ROV & Larry Stone reports that he is better overall from his refractory AB but cough is lingering w/ a definite night-time pattern;  Notes some wheezing & chest soreness;  He has been on mother's NEB machine Bid, Symbicort160-2spBid, Incruse daily, Medrol8 taper- down to 1/2Qod, Hycodan prn;  We decided to continue the Medrol4mg Qod & institute a vigorous antireflux regimen- Protonix40 taken 37min before dinner, NPO after dinner, elev HOB 6"... EXAM shows Afeb, VSS, O2sat=98% on RA;  HEENT- neg, mallamapti2;  Chest- decr BS at bases, min cough & exp wheezing w/o consolidation;  Heart- RR w/o m/r/g;  Abd- soft, neg;  Ext- neg w/o c/c/e...  IMP/PLAN>>  Larry Stone is improved but w/ persist symptoms esp cough at night- we decided to continue the Medrol4mg  Qod & institute a vigorous antireflux regimen... Plan ROV 6-8wks.  ~  June 30, 2015:  43mo ROV & Larry Stone reports that his breathing is better, min chest congestion & non-productive cough, still notes occas wheezing; no CP, no tightness, no f/c/s;  NOTE> HE HAS STOPPED THE MEDROL& STOPPED THE  NEBS on his own, still using the Symbicort & Incruse regularly; rec to add MUCINEX600-2Bid w/ fluids & [facility]...  EXAM shows Afeb, VSS, O2sat=98% on RA;  HEENT- clears throat/ ?VCD, mallamapti2;  Chest- decr BS at bases, min cough & exp wheezing w/o consolidation;  Heart- RR w/o m/r/g;  Abd- soft, neg;  Ext- neg w/o c/c/e...  IMP/PLAN>>  We will refer to ENT for completeness & upper airway symptoms, throat clearing, etc; he needs to maintain a vigorous antireflux regimen + Symbicort/ Incruse/ Albut NEBS...   ~  August 12, 2015:  6wk ROV & add-on appt requested due to incr dyspnea>  Larry Stone reports that he was doing satis on the Symbicort160-2spBid & Incruse daily (having prev stopped the NEB rx and Medrol) but called yest w/ temp 103 he says, felt "out of it" w/ chills, aching/sore, fatigued/ weak and incr cough w/o much sput production;  He says he saw ENT in the interim- nothing wrong, no change in rx recommended (we do not have note from them);  Note- his wt is down 11# to 211# today...    OSA> s/p UPPP surg 2004; uses Transderm scope prn dizziness, uses "breathe right nasal strips" periodically; resting OK, no daytime hypersomnolence...    COPD & HxBronchitis> on Symbicort160-2Bid & Incruse daily; has his mother's old NEB machine & using Albut prn;  Now c/o dry cough, T103, etc (as above)=>     Elev IgE at 1362 & 1497; see RAST results above;  His insurance denied Xolair rx; he has been on steroids and uses Zyrtek10...    His last Cards check was 05/08/15 w/ DrMcAlhany> Hx MI in 2004 w/ resid 50% Lmain; had CardiacCT 2011 w/ 50% LAD, Myoview 2015 w/ inferolat scar, no ischemia, EF=52%;  Myoview 6/15 showed inferolat scarno ischemia, EF=52%; clinically stable & not c/o CP & no changes made...    He had medical f/u DrPlotnikov 5/16> HBP, CAD, HL, anxiety- all stable on meds and no changes made...    He continues to f/u w/ Podiatry- DrRegal> seen 4/16 for heel pain, plantar  faciitis- injected & given brace +  oral anti-inflamm rx... EXAM shows Afeb, VSS, O2sat=92% on RA;  HEENT- clears throat/ ?VCD, mallamapti2;  Chest- decr BS at bases, min cough & no w/r/r/ or consolidation;  Heart- RR w/o m/r/g;  Abd- soft, neg;  Ext- neg w/o c/c/e...   CXR 08/12/15> norm heart size, no acute pulm infiltrates/ effusion/ etc, right lat pleural thickening; degen changes in spine...  LABS 08/12/15> Chems- wnl  X BS=121, Cr=1.2, TBili=1.8;  BNP= 35;  CBC- ok x WBC=15.7, Hg=14.9;  Sed=41; IMP/PLAN>>  We decided to treat w/ LEVAQUIN x7d, Depo80, PRED20mg - 7d tapering sched; use NEB w/ DUONEB Tid followed by Symbicort & Incruse; continue to follow the vigorous antireflux regimen; ROV in 3 weeks time...            Problem List:     ALLERGIC to ASA w/ hives, & ACE inhibitors/ ARBs too w/ angioedema.  OBSTRUCTIVE SLEEP APNEA (ICD-327.23) - s/p UPPP surgery in 2004 by DrRedman...  states he's doing OK, denies snoring, no daytime hypersom, wife not complaining... uses ZYRTEK for allergy symtoms. ~  1/13: He has started using the "breathe right nasal strips" qhs for recurrent snoring; still says he rests well, denies daytime hypersomnolence, & declines offer for repeat sleep study to check... ~  7/14:  He reports resting well, no daytime symptoms or sleep issues...  ~  8/15:  He denies sleep disordered breathing... ~  6/16:  He remains asymptomatic- no snoring, daytime hypersomnolence, etc...  Hx of BRONCHITIS, RECURRENT (ICD-491.9) - he is an ex-smoker, prev 1 ppd for 47yrs, quit in 2004. COPD, mixed type- w/ mild centrilobular emphysema on CT Chest 10/11, & chronic bronchitis clinically w/ recurrent infections... ~  Hx tiny RML nodule seen on Cardiac CT 2/11 & apparently unchanged from old [place] 2004... ~  10/11: f/u CT Chest for f/u tiny 47mm nodule RML- unchanged xyrs & benign, mild centilob emphysema, +coronary calcif, NAD... ~  1/13: he denies breathing problems, intercurrent  infections, etc... ~  1/14:  CXR 1/14 showed normal heart size, clear lungs, chr right rib deformity (old fx), NAD.  ~  8/15: he was concerned about several recent bouts of bronchitis requiring ZPak for Rx> we checked labs and Immunoglobulins =>   Decided to start inhaler w/ DULERA100-2spBid...  ~  CXR 8/15 showed normal heart size, clear lungs w/o infiltrates, old healed right rib fxs, NAD. ~  PFT 8/15 showed FVC=2.32 (50%), FEV1=1.63 (45%), %1sec=70, mid-flows=33% predicted; suggestive of GOLD Stage 3 COPD but can't r/o superimposed restriction w/o lung volume measurement. ~  LABS 8/15 showed SPE/IEP w/o monoclonal gammopathy; Quant Ig's showed normal IgG, normal IgA, low IgM at 26 (40-250), and a hi IgE at 1362 => we will follow w/ RAST panel & consider Xolair Rx. ~  11/15: he didn't fill the [facility] due to cost- we decided to try SYMBICORT160-2spBid... ~  LABS 11/15 showed elev IgE level and mixed RAST panel allergens; discussed avoidance & he is a good cand for [employer] => insurance would NOT approve this med... ~  12/16: on Symbicort160-2spBid + using his mother's nebulizer +Albut; presented w/ refractory COPD exac=> treated w/ ZPak, Medrol taper, add Incruse & use NEB Tid, mucinex + Hycodan...  ~  1/17: on mother's NEB machine Bid, Symbicort160-2spBid, Incruse daily, Medrol8 taper- down to 1/2Qod, Hycodan prn;  We decided to continue the Medrol4mg Qod & institute a vigorous antireflux regimen- Protonix40 taken 71min before dinner, NPO after dinner, elev HOB 6". ~  3/17:  He is improved and backed  off on many of his meds... ~  4/17:  He presents w/ recurrent AB => treated w/ Levaquin, Prednisone20mg - 7d tapering sched, NEBs w/ DUONEB Tid followed by Symbicort160 & Incruse...   MEDICAL PROBLEMS PER DrPlotnikov >>   HYPERTENSION (ICD-401.9) - on COREG 12.5mg Bid, AMLODIPINE 5mg /d, & LASIX 40mg - only as needed (& he hasn't needed any); BP= 116/72 & he denies CP, palpit, ch in SOB,  edema...  ARTERIOSCLEROTIC HEART DISEASE (ICD-414.00) - on PLAVIX 75mg /d, allergic to ASA... followed by [facility] and seen 6/15 (note reviewed)... 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 ~  f/u Cardiac CT 2/11 showed>  Calcified plaque in the left main with < 50% stenosis.  Calcified plaque in the proximal and mid LAD with at most moderate (around 50%) stenosis in the mid LAD. ~  Vasc Screen per Cards 10/11 showed mild carotid plaques, norm AbdAo & ABIs...  HYPERLIPIDEMIA (ICD-272.4) - on LIPITOR 40mg /d, & CoQ10... Last FLP 11/15 on Lip40 showed TChol 153, TG 42, HDL 53, LDL 92  DIABETES MELLITUS, BORDERLINE (ICD-790.29) - on diet alone... Labs 11/15- weight= 224#, BS=105, A1c=6.1  GERD (ICD-530.81) - he uses H2 blockers as needed.  DIVERTICULOSIS OF COLON (ICD-562.10) & COLONIC POLYPS (ICD-211.3) - his last polyps were ~2mm and removed in 2004= adenomatous... there is a +fam hx of colon cancer in his father who died at age 81... ~  Last colonoscopy 5/14 by DrKaplan (done w/ pt on Plavix rx)> mild divertics & 75mm polyp in desc colon removed (tubular adenoma) & repeat rec in 5 yrs.   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 ~  3/15: he is off the prev Testos replacement Rx> states he feels well, good energy, PSA=0.39  DEGENERATIVE JOINT DISEASE (ICD-715.90) - he reports doing fair- mostly c/o knees & hands ("I messed up my knee in a fall")... he saw Ortho ?who? on Glucosamine/ Chondroitin supplements.  LOW BACK PAIN SYNDROME (ICD-724.2)  MEMORY LOSS >>  ~  1/13: he states that his "memory is fuzzy" & "Clarity is missing"; occas he will swear that he said something but he really didn't; he's had prev scans etc & I have recommended a Neurology eval for his memory but he declines at this time "I'll think about it" he said...  ANXIETY (ICD-300.00) - on WELLBUTRIN 150mg Bid, & ALPRAZOLAM 0.5mg  Prn...  increased stress w/ mother's stroke last year... he's the main care giver w/ 2 sisters who Larry Stone't help much...   Health Maintenance: ~  Immuniztions:  he is encouraged to get the yearly Flu vaccine... given PNEUMOVAX 2/12 & PREVNAR-13 given 11/15... He reports a lot of travel & he's been to the travel clinic & had "every vaccine known to man"> TDAP 7/14 and Zoster 5/14...   Past Surgical History  Procedure Laterality Date  . Uvulopalatopharyngoplasty      surgery for OSA  . Cardiac catheterization      Outpatient Encounter Prescriptions as of 08/12/2015  Medication Sig  . albuterol (PROVENTIL) (2.5 MG/3ML) 0.083% nebulizer solution Take 2.5 mg by nebulization every 6 (six) hours as needed for wheezing or shortness of breath.  . ALPRAZolam (XANAX) 0.5 MG tablet TAKE 1 TABLET BY MOUTH 3 TIMES A DAY AS NEEDED  . amLODipine (NORVASC) 5 MG tablet Take 1 tablet (5 mg total) by mouth daily.  Marland Kitchen ammonium lactate (LAC-HYDRIN) 12 % lotion Apply topically as directed.  Marland Kitchen atorvastatin (LIPITOR) 40 MG  tablet Take 1 tablet (40 mg total) by mouth daily.  Marland Kitchen b complex vitamins tablet Take 1 tablet by mouth daily.  . budesonide-formoterol (SYMBICORT) 160-4.5 MCG/ACT inhaler INHALE 2 PUFFS INTO THE LUNGS 2 (TWO) TIMES DAILY.  Marland Kitchen buPROPion (WELLBUTRIN SR) 150 MG 12 hr tablet Take 1 tablet (150 mg total) by mouth 2 (two) times daily.  . carvedilol (COREG) 12.5 MG tablet TAKE 1 TABLET BY MOUTH TWICE A DAY WITH A MEAL  . cetirizine (ZYRTEC) 10 MG tablet Take 1 tablet (10 mg total) by mouth daily.  . Cholecalciferol (VITAMIN D3) 5000 UNITS CAPS Take one tablet by mouth daily  . clopidogrel (PLAVIX) 75 MG tablet Take 1 tablet (75 mg total) by mouth daily.  . clotrimazole-betamethasone (LOTRISONE) cream APPLY AS DIRECTED  . Coenzyme Q10 (COQ10) 100 MG CAPS Take as directed  . cyanocobalamin 2000 MCG tablet Take 1 tablet (2,000 mcg total) by mouth daily.  . furosemide (LASIX) 40 MG tablet Take 1 tablet (40 mg total)  by mouth daily as needed.  . nitroGLYCERIN (NITROSTAT) 0.4 MG SL tablet Place 1 tablet (0.4 mg total) under the tongue every 5 (five) minutes as needed. For up to 3 doses.  . pantoprazole (PROTONIX) 40 MG tablet Take 1 tablet (40 mg total) by mouth daily. About 30 minutes before dinner time  . sildenafil (VIAGRA) 100 MG tablet TAKE 1 TABLET BY MOUTH AS NEEDED FOR ERECTILE DYSFUNCTION.  . Simethicone (GAS-X EXTRA STRENGTH) 125 MG CAPS Take 125 mg by mouth daily. Prn  . Umeclidinium Bromide (INCRUSE ELLIPTA) 62.5 MCG/INH AEPB Inhale 1 puff into the lungs daily.    Allergies  Allergen Reactions  . Ace Inhibitors Swelling  . Angiotensin Receptor Blockers Swelling  . Aspirin Hives  . Codeine Nausea And Vomiting  . Irbesartan Other (See Comments)    REACTION: allergic to ARBs w/ angioedema  . Ramipril Other (See Comments)    REACTION: Allergic to ACE's w/ angioedema...    Current Medications, Allergies, Past Medical History, Past Surgical History, Family History, and Social History were reviewed in [employer] record.    Review of Systems       See HPI - all other systems neg except as noted...       The patient complains of dyspnea on exertion and muscle weakness.  The patient denies anorexia, fever, weight loss, weight gain, vision loss, decreased hearing, hoarseness, chest pain, syncope, peripheral edema, prolonged cough, headaches, hemoptysis, abdominal pain, melena, hematochezia, severe indigestion/heartburn, hematuria, incontinence, suspicious skin lesions, transient blindness, difficulty walking, depression, unusual weight change, abnormal bleeding, enlarged lymph nodes, and angioedema.     Objective:   Physical Exam    WD, sl overweight, 65 y/o WM in NAD... GENERAL:  Alert & oriented; pleasant & cooperative... HEENT:  [place]/AT, EOM-wnl, PERRLA, EACs-clear, TMs-wnl, NOSE-clear, THROAT-s/p UPPP surg... NECK:  Supple w/ fairROM; no JVD; normal carotid impulses  w/o bruits; no thyromegaly or nodules palpated; no lymphadenopathy. CHEST:  Clear to P & A; without wheezes/ rales/ or rhonchi. HEART:  Regular Rhythm; without murmurs/ rubs/ or gallops. ABDOMEN:  Soft & nontender; normal bowel sounds; no organomegaly or masses detected. EXT: without deformities, mild arthritic changes; no varicose veins/ venous insuffic/ or edema. Neuro:  intact w/o focal abn detected... DERM:  No lesions noted; no rash etc...  RADIOLOGY DATA:  Reviewed in the EPIC EMR & discussed w/ the patient...   LABORATORY DATA:  Reviewed in the EPIC EMR & discussed w/ the patient.Marland KitchenMarland Kitchen  Assessment & Plan:    Hx OSA, s/p UPPP in 2004>  Prev noted recurrent snoring & using breathe right nasal strips; he didn't want repeat sleep study or further eval, just want Rx for the strips so he can pay for them w/ his flex acct...   Hx recurrent bronchitis, underlying COPD (mixed type), COPD exac>   11/15> He quit smoking in 2004 & c/o recurrent bronchitic exacerbations=> PFT shows mod obstructive dis & we tried DULERA but too $$$ therefore switch to SYMBICORT160-2spBid regularly w/ f/u PFTs later... 6/16> Xolair was denied by insurance despite his IgE level= 1400... 12/16> presented w/ refractory COPD exac; treated w/ ZPak, Medroltaper, Incruse, Mucinex, fluids, Hycodan => eventually improved...  3/17> he was improved and backed off on mult meds on his own accord... 4/17> recurrent AB symptoms prompted restart of Pred therapy, vigorous antireflux regimen, plus DUONEB Tid, Symbicort & Incruse  Medical issues per DrPlotnikov>  HBP, CAD s/pMI, VI/ edema, HL, IFG, GERD/ Divertics/ Polyps, GU/ Low-T, DJD/ LBP, Anxiety...    Patient's Medications  New Prescriptions   IPRATROPIUM-ALBUTEROL (DUONEB) 0.5-2.5 (3) MG/3ML SOLN    Take 3 mLs by nebulization 3 (three) times daily.   LEVOFLOXACIN (LEVAQUIN) 750 MG TABLET    Take 1 tablet (750 mg total) by mouth daily.   PREDNISONE (DELTASONE) 20 MG  TABLET    Take as directed  Previous Medications   ALBUTEROL (PROVENTIL) (2.5 MG/3ML) 0.083% NEBULIZER SOLUTION    Take 2.5 mg by nebulization every 6 (six) hours as needed for wheezing or shortness of breath.   ALPRAZOLAM (XANAX) 0.5 MG TABLET    TAKE 1 TABLET BY MOUTH 3 TIMES A DAY AS NEEDED   AMLODIPINE (NORVASC) 5 MG TABLET    Take 1 tablet (5 mg total) by mouth daily.   AMMONIUM LACTATE (LAC-HYDRIN) 12 % LOTION    Apply topically as directed.   ATORVASTATIN (LIPITOR) 40 MG TABLET    Take 1 tablet (40 mg total) by mouth daily.   B COMPLEX VITAMINS TABLET    Take 1 tablet by mouth daily.   BUDESONIDE-FORMOTEROL (SYMBICORT) 160-4.5 MCG/ACT INHALER    INHALE 2 PUFFS INTO THE LUNGS 2 (TWO) TIMES DAILY.   BUPROPION (WELLBUTRIN SR) 150 MG 12 HR TABLET    Take 1 tablet (150 mg total) by mouth 2 (two) times daily.   CARVEDILOL (COREG) 12.5 MG TABLET    TAKE 1 TABLET BY MOUTH TWICE A DAY WITH A MEAL   CETIRIZINE (ZYRTEC) 10 MG TABLET    Take 1 tablet (10 mg total) by mouth daily.   CHOLECALCIFEROL (VITAMIN D3) 5000 UNITS CAPS    Take one tablet by mouth daily   CLOPIDOGREL (PLAVIX) 75 MG TABLET    Take 1 tablet (75 mg total) by mouth daily.   CLOTRIMAZOLE-BETAMETHASONE (LOTRISONE) CREAM    APPLY AS DIRECTED   COENZYME Q10 (COQ10) 100 MG CAPS    Take as directed   CYANOCOBALAMIN 2000 MCG TABLET    Take 1 tablet (2,000 mcg total) by mouth daily.   FUROSEMIDE (LASIX) 40 MG TABLET    Take 1 tablet (40 mg total) by mouth daily as needed.   NITROGLYCERIN (NITROSTAT) 0.4 MG SL TABLET    Place 1 tablet (0.4 mg total) under the tongue every 5 (five) minutes as needed. For up to 3 doses.   PANTOPRAZOLE (PROTONIX) 40 MG TABLET    Take 1 tablet (40 mg total) by mouth daily. About 30 minutes before dinner time  SILDENAFIL (VIAGRA) 100 MG TABLET    TAKE 1 TABLET BY MOUTH AS NEEDED FOR ERECTILE DYSFUNCTION.   SIMETHICONE (GAS-X EXTRA STRENGTH) 125 MG CAPS    Take 125 mg by mouth daily. Prn   UMECLIDINIUM BROMIDE  (INCRUSE ELLIPTA) 62.5 MCG/INH AEPB    Inhale 1 puff into the lungs daily.  Modified Medications   No medications on file  Discontinued Medications   No medications on file

## 2015-08-12 NOTE — Patient Instructions (Signed)
Today we updated your med list in our EPIC system...     We discussed starting back on the NEBULIZER w/ DUONEB three times daily...    Follow this w/ the SYMBICORT 2sp twice daily & the INCRUSE once daily as outlined in the office...  We wrote a new prescription for [facility] antibiotic- one taily til gone...  We gave you a Depo shot today for the airway inflammation>    Starting in the AM 4/26- take the PREDNISONE 20mg  tabs as follows>    Start w/ one tab twice daily for 7 days...    Then decrease to one tab each AM for 7days...    Then decrease to 1/2 tab daily in the AM until your return visit (at which time we will give you further instructions)  If you are congested-- start back on the OTC MUCINEX 600mg  tabs- 2 tabs twice daily w/ fluids...  For the cough-- use the cough syrup (HYCODAN or similar) every 4H as needed...  It is important to maintain the ANTIREFLUX reguimen we discussed>    Protonix 40mg  taken about 30 min before dinner in the eve...    Do not eat or drink much after dinner...    elev the head of your bed on 6" blocks...  Let's plan a recheck in 3 weeks on Wednes 09/03/15 at 900AM..Marland Kitchen

## 2015-08-14 NOTE — Progress Notes (Signed)
Quick Note:  Called and spoke to pt. Informed him of the resutls and recs per SN. Pt verbalized understanding and denied any further questions or concerns at this time.   ______

## 2015-08-14 NOTE — Telephone Encounter (Signed)
Done

## 2015-09-03 ENCOUNTER — Encounter: Payer: Self-pay | Admitting: Pulmonary Disease

## 2015-09-03 ENCOUNTER — Ambulatory Visit (INDEPENDENT_AMBULATORY_CARE_PROVIDER_SITE_OTHER): Payer: Medicare Other | Admitting: Pulmonary Disease

## 2015-09-03 VITALS — BP 122/80 | HR 58 | Temp 97.7°F | Ht 69.0 in | Wt 214.0 lb

## 2015-09-03 DIAGNOSIS — Z23 Encounter for immunization: Secondary | ICD-10-CM | POA: Diagnosis not present

## 2015-09-03 DIAGNOSIS — G4733 Obstructive sleep apnea (adult) (pediatric): Secondary | ICD-10-CM | POA: Diagnosis not present

## 2015-09-03 DIAGNOSIS — J42 Unspecified chronic bronchitis: Secondary | ICD-10-CM | POA: Diagnosis not present

## 2015-09-03 DIAGNOSIS — J441 Chronic obstructive pulmonary disease with (acute) exacerbation: Secondary | ICD-10-CM | POA: Diagnosis not present

## 2015-09-03 DIAGNOSIS — I10 Essential (primary) hypertension: Secondary | ICD-10-CM

## 2015-09-03 DIAGNOSIS — R768 Other specified abnormal immunological findings in serum: Secondary | ICD-10-CM

## 2015-09-03 DIAGNOSIS — I2510 Atherosclerotic heart disease of native coronary artery without angina pectoris: Secondary | ICD-10-CM

## 2015-09-03 DIAGNOSIS — R760 Raised antibody titer: Secondary | ICD-10-CM

## 2015-09-03 MED ORDER — PREDNISONE 10 MG PO TABS: ORAL_TABLET | ORAL | Status: DC

## 2015-09-03 MED ORDER — HYDROCODONE-HOMATROPINE 5-1.5 MG/5ML PO SYRP: 5.0000 mL | ORAL_SOLUTION | Freq: Four times a day (QID) | ORAL | Status: DC | PRN

## 2015-09-03 NOTE — Patient Instructions (Signed)
Today we updated your med list in our EPIC system...    Continue your current medications the same...  We will contact [place] regarding a new NEBULIZER machine...    Continue the DUONEB Three times daily...    Continue the SYMBICORT160- 2sp twice daily...    Continue the INCRUSE one inhalation daily...  We adjusted the dose of PREDNISONE down to 10mg  tabs>    Taking one tab alternating w/ 1/2 tab every other day (ie- 10mg - 5mg - 10- 5- etc)  We refilled your Hycodan cough syrup for as needed use...  We gave you the 2nd Pneumovax-23 vaccine today (you will not require any further pneumonia shots)...  Finally I would like to refer you to Dr. Allena Katz for an allergy/ Immunology evaluation...    He might be able to get the [facility] approved for use on your condition...  Call for any questions...  Let's plan a follow up visit in 6-8 weeks, sooner if needed for problems.Marland KitchenMarland Kitchen

## 2015-09-03 NOTE — Progress Notes (Addendum)
Subjective:    Patient ID: Larry Stone, male    DOB: [birth year], 65 y.o.   MRN: [id number]  HPI 65 y/o WM here for a followup visit... he has multiple medical problems as noted below...  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 and DrPlotnikov for Primary Care- Chol, Impaired fasting glucose, etc... ~  SEE PREV EPIC NOTES FOR OLDER DATA >>    CT Chest 01/2010>>  1.62mm right middle lobe lung nodule is similar back to the abdominal CT of 02/16/2003, consistent with benignity, most likely a subpleural lymph node;  2. No acute process in the chest;  3 Distal esophageal wall thickening again suggests esophagitis;  4.Mild centrilobular emphysema;  5.Age advanced atherosclerosis.  CXR 1/14 showed normal heart size, clear lungs, chr right rib deformity (old fx), NAD...  LABS 1/14:  FLP- at goals on Lip40+Niasp;  Chems- wnl;  CBC- wnl;  TSH=1.93;  PSA=0.39;  Testos=307 (350-890) on Testim 2tubes/d;  B12=1005 (211-911) on B12 2053mcg/d...  LABS 4/15:  FLP- at goals on Lip40;  Chems- ok w/ BS=115, A1c=6.0, Cr=1.2;  CBC- wnl;  TSH=1.55;  PSA=0.39...   ~  December 13, 2013:  33mo ROV & Larry Stone has established w/ DrPlotnikov for Primary Care, seen 5/15 & note reviewed...  He requested Pulm f/u visit due to concern over several recurrent bronchitic infections over the last few months- treated w/ ZPaks and improved but he & wife are concerned as to why he is getting these; he uses OTC Zyrtek10mg /d, he is an ex-smoker (1ppd for 78yrs, quit 2004), hx tiny 24mm nodule in RML on prior CT Chest along w/ mild centrilob emphysema & no changes serially- last CXR 1/14 w/ norm heart size, clear lungs x mild bronchitic change, and chr right posterolat rib deformity... He is c/o several bouts of bronchitis w/ cough, beige sput, denies f/c/s, but feels tight/ congested/ occas wheezing & SOB, no CP;  I cannot find  prev PFTs and we discussed f/u CXR, PFT, and Labs to check for the unlikely presence of an immune defic syndrome... We reviewed prob list, meds, xrays and labs> see below for updates >>   CXR 8/15 showed normal heart size, clear lungs w/o infiltrates, old healed right rib fxs, NAD...   PFT 8/15 showed FVC=2.32 (50%), FEV1=1.63 (45%), %1sec=70, mid-flows=33% predicted; suggestive of GOLD Stage 3 COPD but can't r/o superimposed restriction w/o lung volume measurement...  LABS 8/15:  SPE/IEP w/o monoclonal gammopathy; Quant Ig's showed normal IgG, normal IgA, low IgM at 26 (40-250), and a hi IgE at 1362 => we will follow w/ RAST panel & consider Xolair Rx...  RAST Panel 9/15 => see below... PLAN>>  We discussed starting regular dosing of an inhaled ICS/LABA combination=> try DULERA100- 2spBid; ROV in 3 mo & we will plan Full PFTs at that time...  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  ~  March 18, 2014:  81mo ROV & f/u dyspnea> Larry Stone states that the [facility] was $400 at the Pharm & they/he didn't call for replacement inhaler, states he filled it one time & ran out several wks ago, we discussed what to do in that eventuality (call  us to let us know & get a different Rx), we will try SYMBICORT160-2spBid & he will let us know what his [employer] co-pay is for this med;  Similarly he did not return to get the RAST tests done, but he informs me that they were drawn several days ago w/ blood work for [employer]- results pending...  We reviewed his prev Hx/ symptoms, exam (chest is clear today), and evaluation; all questions answered, we decided to try the Symbicort160 regularly over the next 50mo & plan f/u Full-PFTs in the future;  In the meanwhile we will await the results of the RAST tests and decide regarding  Xolair vs Allergy evaluation...    We reviewed prob list, meds, xrays and labs> see below for updates >> He was given the 2015 flu vaccine recently and the Prevnar-13 vaccination as well...  LABS 11/15:  FLP- at goals on Lip40;  Chems- wnl w/ BS=105, A1c=6.1;  CBC- wnl w/ Hg=15.1;  TSH=1.77, PSA=0.50...   RAST Test 9/15:  returned w/ IgE level = 1497 and Allergen panel pos for some molds, grasses, trees, ragweed, cats>dogs; and Food panel pos for Milk> Wheat> Shrimp/ Tomato/ Peanut, etc... We discussed avoidance strategies and other options- he is interested in Xolair & we will proceed w/ the paperwork for Xolair approval...   ~  October 15, 2014:  26mo ROV & Larry Stone is still awaiting insurance approval for Xolair!  He reports removing 2 ticks from his privates recently!  Weight down 12# on diet and he notes some improvement in his dyspnea- now just noted w/ intense activ he says eg- climbing/ stairs/ etc but walking is ok...     OSA> s/p UPPP surg 2004; uses Transderm scope prn dizziness, uses "breathe right nasal strips" periodically; resting OK, no daytime hypersomnolence...    COPD & HxBronchitis> on Symbicort160-2Bid & Zyrtek prn; doing satis w/o cough, phlegm, etc...    Elev IgE at 1362 & 1497; see RAST results above;  We are awaiting approval for Xolair...    His last Cards check was 12/25 w/ DrMcAlhany> Hx MI in 2004 w/ resid 50% Lmain; had CardiacCT 2011 w/ 50% LAD, Myoview 2015 w/ inferolat scar, no ischemia, EF=52%; clinically stable & not c/o CP & no changes made...    He had medical f/u DrPlotnikov 5/16> HBP, CAD, HL, anxiety- all stable on meds and no changes made...    He continues to f/u w/ Podiatry- DrRegal> seen 4/16 for heel pain, plantar faciitis- injected & given brace + oral anti-inflamm rx... We reviewed prob list, meds, xrays and labs> see below for updates >>  ADDENDUM>>  [name] was denied by his insurance company...  ~  April 04, 2015:  25mo ROV & Don called for an add-on appt >   He is c/o a 16mo hx sudden onset SOB/DOE, fatigue, cough & chest congestion w/ sl beige phlegm, no hemoptysis, no CP x sore from coughing, and no f/c/s;  We called in Pred dosepak but he says no better, not resting, ?cough worse Qhs, incr SOB & he denies any reflux/ GERD, etc... He has been using the Symbicort160-2spBid & Zyrtek10... EXAM shows Afeb, VSS, O2sat=98% on RA;  HEENT- neg, mallamapti2;  Chest- decr BS at bases, dry cough, clear w/o w/r/r;  Heart- RR w/o m/r/g;  Abd- soft, neg;  Ext- neg w/o c/c/e...   CXR 04/04/15 showed norm heart size, clear lungs, old right rib fx, NAD...  IMP/PLAN>>  Larry Stone appears to have a refractory COPD exac &  we discussed additional treatment w/ ZPak, Depo80/Medrol8mg  tapering sched, add INCRUSE one sp daily, Mucinex 1200mg Bid + fluids, and OK to use mother's old NEB machine  W/ Albut Tid;  Also wrote for [facility] for prn use...  ~  May 01, 2015:  44mo ROV & Don reports that he is better overall from his refractory AB but cough is lingering w/ a definite night-time pattern;  Notes some wheezing & chest soreness;  He has been on mother's NEB machine Bid, Symbicort160-2spBid, Incruse daily, Medrol8 taper- down to 1/2Qod, Hycodan prn;  We decided to continue the Medrol4mg Qod & institute a vigorous antireflux regimen- Protonix40 taken 37min before dinner, NPO after dinner, elev HOB 6"... EXAM shows Afeb, VSS, O2sat=98% on RA;  HEENT- neg, mallamapti2;  Chest- decr BS at bases, min cough & exp wheezing w/o consolidation;  Heart- RR w/o m/r/g;  Abd- soft, neg;  Ext- neg w/o c/c/e...  IMP/PLAN>>  Larry Stone is improved but w/ persist symptoms esp cough at night- we decided to continue the Medrol4mg  Qod & institute a vigorous antireflux regimen... Plan ROV 6-8wks.  ~  June 30, 2015:  43mo ROV & Larry Stone reports that his breathing is better, min chest congestion & non-productive cough, still notes occas wheezing; no CP, no tightness, no f/c/s;  NOTE> HE HAS STOPPED THE MEDROL& STOPPED THE  NEBS on his own, still using the Symbicort & Incruse regularly; rec to add MUCINEX600-2Bid w/ fluids & [facility]...  EXAM shows Afeb, VSS, O2sat=98% on RA;  HEENT- clears throat/ ?VCD, mallamapti2;  Chest- decr BS at bases, min cough & exp wheezing w/o consolidation;  Heart- RR w/o m/r/g;  Abd- soft, neg;  Ext- neg w/o c/c/e...  IMP/PLAN>>  We will refer to ENT for completeness & upper airway symptoms, throat clearing, etc; he needs to maintain a vigorous antireflux regimen + Symbicort/ Incruse/ Albut NEBS...   ~  August 12, 2015:  6wk ROV & add-on appt requested due to incr dyspnea>  Larry Stone reports that he was doing satis on the Symbicort160-2spBid & Incruse daily (having prev stopped the NEB rx and Medrol) but called yest w/ temp 103 he says, felt "out of it" w/ chills, aching/sore, fatigued/ weak and incr cough w/o much sput production;  He says he saw ENT in the interim- nothing wrong, no change in rx recommended (we do not have note from them);  Note- his wt is down 11# to 211# today...    OSA> s/p UPPP surg 2004; uses Transderm scope prn dizziness, uses "breathe right nasal strips" periodically; resting OK, no daytime hypersomnolence...    COPD & HxBronchitis> on Symbicort160-2Bid & Incruse daily; has his mother's old NEB machine & using Albut prn;  Now c/o dry cough, T103, etc (as above)=>     Elev IgE at 1362 & 1497; see RAST results above;  His insurance denied Xolair rx; he has been on steroids and uses Zyrtek10...    His last Cards check was 05/08/15 w/ DrMcAlhany> Hx MI in 2004 w/ resid 50% Lmain; had CardiacCT 2011 w/ 50% LAD, Myoview 2015 w/ inferolat scar, no ischemia, EF=52%;  Myoview 6/15 showed inferolat scarno ischemia, EF=52%; clinically stable & not c/o CP & no changes made...    He had medical f/u DrPlotnikov 5/16> HBP, CAD, HL, anxiety- all stable on meds and no changes made...    He continues to f/u w/ Podiatry- DrRegal> seen 4/16 for heel pain, plantar  faciitis- injected & given brace +  oral anti-inflamm rx... EXAM shows Afeb, VSS, O2sat=92% on RA;  HEENT- clears throat/ ?VCD, mallamapti2;  Chest- decr BS at bases, min cough & no w/r/r/ or consolidation;  Heart- RR w/o m/r/g;  Abd- soft, neg;  Ext- neg w/o c/c/e...   CXR 08/12/15> norm heart size, no acute pulm infiltrates/ effusion/ etc, right lat pleural thickening; degen changes in spine...  LABS 08/12/15> Chems- wnl  X BS=121, Cr=1.2, TBili=1.8;  BNP= 35;  CBC- ok x WBC=15.7, Hg=14.9;  Sed=41; IMP/PLAN>>  We decided to treat w/ LEVAQUIN x7d, Depo80, PRED20mg - 7d tapering sched; use NEB w/ DUONEB Tid followed by Symbicort & Incruse; continue to follow the vigorous antireflux regimen; ROV in 3 weeks time...  ~  Sep 03, 2015:  3wk ROV & Larry Stone reports some improvement on his regimen of PRED20mg -1/2 tab Qam now, NEBs w/ Duoneb Tid, Symbicort160-2spBid, Incruse daily; he notes "improved, but not well" w/ CC= dry cough, in paroxysms, hacking, small amt thick sputum, no f/c/s, no CP, no SOB x w/ the coughing spells; he notes that he coughs in restaurants, notes the coughing spells occur w/o rhyme or reason, better if he wals outside...  EXAM shows Afeb, VSS, O2sat=98% on RA;  HEENT- neg, mallamapti2;  Chest- decr BS at bases, clear, no w/r/r/ or consolidation;  Heart- RR w/o m/r/g;  Abd- soft, neg;  Ext- neg w/o c/c/e...  IMP/PLAN>>  Larry Stone needs a new NEBULIZER machine; he is asked to wean the Pred from 10mg /d to 10mg  alternating w/ 5mg  QOD; contuinue the Duoneb tid, Symbicort-2Bid, Incruse daily, Hycodan prn; he needs to redouble his efforts at the antireflux regimen;  We will refer to DrKozlow for his assessment of refractory asthmatic bronchitis- recall the RAST was abn 2015 & IgE level ~1400 but insurance denied Xolair... NOTE>>  We gave him the second Pneumovax 23 today & this gets him up-to-date on his immuniz.            Problem List:     ALLERGIC to ASA w/ hives, & ACE inhibitors/ ARBs too w/  angioedema.  OBSTRUCTIVE SLEEP APNEA (ICD-327.23) - s/p UPPP surgery in 2004 by DrRedman...  states he's doing OK, denies snoring, no daytime hypersom, wife not complaining... uses ZYRTEK for allergy symtoms. ~  1/13: He has started using the "breathe right nasal strips" qhs for recurrent snoring; still says he rests well, denies daytime hypersomnolence, & declines offer for repeat sleep study to check... ~  7/14:  He reports resting well, no daytime symptoms or sleep issues...  ~  8/15:  He denies sleep disordered breathing... ~  6/16:  He remains asymptomatic- no snoring, daytime hypersomnolence, etc...  Hx of BRONCHITIS, RECURRENT (ICD-491.9) - he is an ex-smoker, prev 1 ppd for 85yrs, quit in 2004. COPD, mixed type- w/ mild centrilobular emphysema on CT Chest 10/11, & chronic bronchitis clinically w/ recurrent infections... ~  Hx tiny RML nodule seen on Cardiac CT 2/11 & apparently unchanged from old [place] 2004... ~  10/11: f/u CT Chest for f/u tiny 26mm nodule RML- unchanged xyrs & benign, mild centilob emphysema, +coronary calcif, NAD... ~  1/13: he denies breathing problems, intercurrent infections, etc... ~  1/14:  CXR 1/14 showed normal heart size, clear lungs, chr right rib deformity (old fx), NAD.  ~  8/15: he was concerned about several recent bouts of bronchitis requiring ZPak for Rx> we checked labs and Immunoglobulins =>   Decided to start inhaler w/ DULERA100-2spBid...  ~  CXR 8/15 showed normal heart size,  clear lungs w/o infiltrates, old healed right rib fxs, NAD. ~  PFT 8/15 showed FVC=2.32 (50%), FEV1=1.63 (45%), %1sec=70, mid-flows=33% predicted; suggestive of GOLD Stage 3 COPD but can't r/o superimposed restriction w/o lung volume measurement. ~  LABS 8/15 showed SPE/IEP w/o monoclonal gammopathy; Quant Ig's showed normal IgG, normal IgA, low IgM at 26 (40-250), and a hi IgE at 1362 => we will follow w/ RAST panel & consider Xolair Rx. ~  11/15: he didn't fill the [facility] due  to cost- we decided to try SYMBICORT160-2spBid... ~  LABS 11/15 showed elev IgE level and mixed RAST panel allergens; discussed avoidance & he is a good cand for [employer] => insurance would NOT approve this med... ~  12/16: on Symbicort160-2spBid + using his mother's nebulizer +Albut; presented w/ refractory COPD exac=> treated w/ ZPak, Medrol taper, add Incruse & use NEB Tid, mucinex + Hycodan...  ~  1/17: on mother's NEB machine Bid, Symbicort160-2spBid, Incruse daily, Medrol8 taper- down to 1/2Qod, Hycodan prn;  We decided to continue the Medrol4mg Qod & institute a vigorous antireflux regimen- Protonix40 taken 69min before dinner, NPO after dinner, elev HOB 6". ~  3/17:  He is improved and backed off on many of his meds... ~  4/17:  He presents w/ recurrent AB => treated w/ Levaquin, Prednisone20mg - 7d tapering sched, NEBs w/ DUONEB Tid followed by Symbicort160 & Incruse...   MEDICAL PROBLEMS PER DrPlotnikov >>   HYPERTENSION (ICD-401.9) - on COREG 12.5mg Bid, AMLODIPINE 5mg /d, & LASIX 40mg - only as needed (& he hasn't needed any); BP= 116/72 & he denies CP, palpit, ch in SOB, edema...  ARTERIOSCLEROTIC HEART DISEASE (ICD-414.00) - on PLAVIX 75mg /d, allergic to ASA... followed by [facility] and seen 6/15 (note reviewed)... 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 ~  f/u Cardiac CT 2/11 showed>  Calcified plaque in the left main with < 50% stenosis.  Calcified plaque in the proximal and mid LAD with at most moderate (around 50%) stenosis in the mid LAD. ~  Vasc Screen per Cards 10/11 showed mild carotid plaques, norm AbdAo & ABIs...  HYPERLIPIDEMIA (ICD-272.4) - on LIPITOR 40mg /d, & CoQ10... Last FLP 11/15 on Lip40 showed TChol 153, TG 42, HDL 53, LDL 92  DIABETES MELLITUS, BORDERLINE (ICD-790.29) - on diet alone... Labs 11/15- weight= 224#, BS=105, A1c=6.1  GERD (ICD-530.81) - he uses H2 blockers as needed.  DIVERTICULOSIS OF COLON (ICD-562.10) &  COLONIC POLYPS (ICD-211.3) - his last polyps were ~61mm and removed in 2004= adenomatous... there is a +fam hx of colon cancer in his father who died at age 61... ~  Last colonoscopy 5/14 by DrKaplan (done w/ pt on Plavix rx)> mild divertics & 60mm polyp in desc colon removed (tubular adenoma) & repeat rec in 5 yrs.   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 ~  3/15: he is off the prev Testos replacement Rx> states he feels well, good energy, PSA=0.39  DEGENERATIVE JOINT DISEASE (ICD-715.90) - he reports doing fair- mostly c/o knees & hands ("I messed up my knee in a fall")... he saw Ortho ?who? on Glucosamine/ Chondroitin supplements.  LOW BACK PAIN SYNDROME (ICD-724.2)  MEMORY LOSS >>  ~  1/13: he states that his "memory is fuzzy" & "Clarity is missing"; occas he will swear that he said something but he really didn't; he's had prev scans etc & I have recommended a Neurology eval for his memory but he declines at  this time "I'll think about it" he said...  ANXIETY (ICD-300.00) - on WELLBUTRIN 150mg Bid, & ALPRAZOLAM 0.5mg  Prn... increased stress w/ mother's stroke last year... he's the main care giver w/ 2 sisters who don't help much...   Health Maintenance: ~  Immuniztions:  he is encouraged to get the yearly Flu vaccine... given PNEUMOVAX 2/12 & PREVNAR-13 given 11/15... He reports a lot of travel & he's been to the travel clinic & had "every vaccine known to man"> TDAP 7/14 and Zoster 5/14...   Past Surgical History  Procedure Laterality Date  . Uvulopalatopharyngoplasty      surgery for OSA  . Cardiac catheterization      Outpatient Encounter Prescriptions as of 09/03/2015  Medication Sig  . ALPRAZolam (XANAX) 0.5 MG tablet TAKE 1 TABLET BY MOUTH 3 TIMES A DAY AS NEEDED  . amLODipine (NORVASC) 5 MG tablet Take 1 tablet (5 mg total) by mouth daily.  Marland Kitchen atorvastatin (LIPITOR) 40 MG tablet Take 1 tablet (40 mg total) by mouth daily.  Marland Kitchen b  complex vitamins tablet Take 1 tablet by mouth daily.  . budesonide-formoterol (SYMBICORT) 160-4.5 MCG/ACT inhaler INHALE 2 PUFFS INTO THE LUNGS 2 (TWO) TIMES DAILY.  Marland Kitchen buPROPion (WELLBUTRIN SR) 150 MG 12 hr tablet Take 1 tablet (150 mg total) by mouth 2 (two) times daily.  . carvedilol (COREG) 12.5 MG tablet TAKE 1 TABLET BY MOUTH TWICE A DAY WITH A MEAL  . cetirizine (ZYRTEC) 10 MG tablet Take 1 tablet (10 mg total) by mouth daily.  . Cholecalciferol (VITAMIN D3) 5000 UNITS CAPS Take one tablet by mouth daily  . clopidogrel (PLAVIX) 75 MG tablet Take 1 tablet (75 mg total) by mouth daily.  . clotrimazole-betamethasone (LOTRISONE) cream APPLY AS DIRECTED  . Coenzyme Q10 (COQ10) 100 MG CAPS Take as directed  . cyanocobalamin 2000 MCG tablet Take 1 tablet (2,000 mcg total) by mouth daily.  . furosemide (LASIX) 40 MG tablet Take 1 tablet (40 mg total) by mouth daily as needed.  Marland Kitchen ipratropium-albuterol (DUONEB) 0.5-2.5 (3) MG/3ML SOLN Take 3 mLs by nebulization 3 (three) times daily.  Marland Kitchen levofloxacin (LEVAQUIN) 750 MG tablet Take 1 tablet (750 mg total) by mouth daily.  . nitroGLYCERIN (NITROSTAT) 0.4 MG SL tablet Place 1 tablet (0.4 mg total) under the tongue every 5 (five) minutes as needed. For up to 3 doses.  . pantoprazole (PROTONIX) 40 MG tablet Take 1 tablet (40 mg total) by mouth daily. About 30 minutes before dinner time  . sildenafil (VIAGRA) 100 MG tablet TAKE 1 TABLET BY MOUTH AS NEEDED FOR ERECTILE DYSFUNCTION.  . Simethicone (GAS-X EXTRA STRENGTH) 125 MG CAPS Take 125 mg by mouth daily. Prn  . Umeclidinium Bromide (INCRUSE ELLIPTA) 62.5 MCG/INH AEPB Inhale 1 puff into the lungs daily.  Marland Kitchen ammonium lactate (LAC-HYDRIN) 12 % lotion Apply topically as directed. (Patient not taking: Reported on 09/03/2015)  . HYDROcodone-homatropine (HYCODAN) 5-1.5 MG/5ML syrup Take 5 mLs by mouth every 6 (six) hours as needed for cough.  . predniSONE (DELTASONE) 20 MG tablet Take as directed (Patient not  taking: Reported on 09/03/2015)  . [DISCONTINUED] albuterol (PROVENTIL) (2.5 MG/3ML) 0.083% nebulizer solution Take 2.5 mg by nebulization every 6 (six) hours as needed for wheezing or shortness of breath. Reported on 09/03/2015    Allergies  Allergen Reactions  . Ace Inhibitors Swelling  . Angiotensin Receptor Blockers Swelling  . Aspirin Hives  . Codeine Nausea And Vomiting  . Irbesartan Other (See Comments)    REACTION: allergic to  ARBs w/ angioedema  . Ramipril Other (See Comments)    REACTION: Allergic to ACE's w/ angioedema...    Current Medications, Allergies, Past Medical History, Past Surgical History, Family History, and Social History were reviewed in [employer] record.    Review of Systems       See HPI - all other systems neg except as noted...       The patient complains of dyspnea on exertion and muscle weakness.  The patient denies anorexia, fever, weight loss, weight gain, vision loss, decreased hearing, hoarseness, chest pain, syncope, peripheral edema, prolonged cough, headaches, hemoptysis, abdominal pain, melena, hematochezia, severe indigestion/heartburn, hematuria, incontinence, suspicious skin lesions, transient blindness, difficulty walking, depression, unusual weight change, abnormal bleeding, enlarged lymph nodes, and angioedema.     Objective:   Physical Exam    WD, sl overweight, 65 y/o WM in NAD... GENERAL:  Alert & oriented; pleasant & cooperative... HEENT:  [place]/AT, EOM-wnl, PERRLA, EACs-clear, TMs-wnl, NOSE-clear, THROAT-s/p UPPP surg... NECK:  Supple w/ fairROM; no JVD; normal carotid impulses w/o bruits; no thyromegaly or nodules palpated; no lymphadenopathy. CHEST:  Clear to P & A; without wheezes/ rales/ or rhonchi. HEART:  Regular Rhythm; without murmurs/ rubs/ or gallops. ABDOMEN:  Soft & nontender; normal bowel sounds; no organomegaly or masses detected. EXT: without deformities, mild arthritic changes; no varicose veins/  venous insuffic/ or edema. Neuro:  intact w/o focal abn detected... DERM:  No lesions noted; no rash etc...  RADIOLOGY DATA:  Reviewed in the EPIC EMR & discussed w/ the patient...   LABORATORY DATA:  Reviewed in the EPIC EMR & discussed w/ the patient...     Assessment & Plan:    Hx OSA, s/p UPPP in 2004>  Prev noted recurrent snoring & using breathe right nasal strips; he didn't want repeat sleep study or further eval, just want Rx for the strips so he can pay for them w/ his flex acct...   Hx recurrent bronchitis, underlying COPD (mixed type), COPD exac>   11/15> He quit smoking in 2004 & c/o recurrent bronchitic exacerbations=> PFT shows mod obstructive dis & we tried DULERA but too $$$ therefore switch to SYMBICORT160-2spBid regularly w/ f/u PFTs later... 6/16> Xolair was denied by insurance despite his IgE level= 1400... 12/16> presented w/ refractory COPD exac; treated w/ ZPak, Medroltaper, Incruse, Mucinex, fluids, Hycodan => eventually improved...  3/17> he was improved and backed off on mult meds on his own accord... 4/17> recurrent AB symptoms prompted restart of Pred therapy, vigorous antireflux regimen, plus DUONEB Tid, Symbicort & Incruse 5/17> weaning Pred to 10-5 Qod schedule, continue other Rx, refer to DrKozlow for his input...  Medical issues per DrPlotnikov>  HBP, CAD s/pMI, VI/ edema, HL, IFG, GERD/ Divertics/ Polyps, GU/ Low-T, DJD/ LBP, Anxiety...    Patient's Medications  New Prescriptions   HYDROCODONE-HOMATROPINE (HYCODAN) 5-1.5 MG/5ML SYRUP    Take 5 mLs by mouth every 6 (six) hours as needed for cough.   PREDNISONE (DELTASONE) 10 MG TABLET    Alternate 1 tablet and 1/2 tablet every other day.  Previous Medications   ALPRAZOLAM (XANAX) 0.5 MG TABLET    TAKE 1 TABLET BY MOUTH 3 TIMES A DAY AS NEEDED   AMLODIPINE (NORVASC) 5 MG TABLET    Take 1 tablet (5 mg total) by mouth daily.   AMMONIUM LACTATE (LAC-HYDRIN) 12 % LOTION    Apply topically as directed.    ATORVASTATIN (LIPITOR) 40 MG TABLET    Take 1 tablet (40 mg  total) by mouth daily.   B COMPLEX VITAMINS TABLET    Take 1 tablet by mouth daily.   BUDESONIDE-FORMOTEROL (SYMBICORT) 160-4.5 MCG/ACT INHALER    INHALE 2 PUFFS INTO THE LUNGS 2 (TWO) TIMES DAILY.   BUPROPION (WELLBUTRIN SR) 150 MG 12 HR TABLET    Take 1 tablet (150 mg total) by mouth 2 (two) times daily.   CARVEDILOL (COREG) 12.5 MG TABLET    TAKE 1 TABLET BY MOUTH TWICE A DAY WITH A MEAL   CETIRIZINE (ZYRTEC) 10 MG TABLET    Take 1 tablet (10 mg total) by mouth daily.   CHOLECALCIFEROL (VITAMIN D3) 5000 UNITS CAPS    Take one tablet by mouth daily   CLOPIDOGREL (PLAVIX) 75 MG TABLET    Take 1 tablet (75 mg total) by mouth daily.   CLOTRIMAZOLE-BETAMETHASONE (LOTRISONE) CREAM    APPLY AS DIRECTED   COENZYME Q10 (COQ10) 100 MG CAPS    Take as directed   CYANOCOBALAMIN 2000 MCG TABLET    Take 1 tablet (2,000 mcg total) by mouth daily.   FUROSEMIDE (LASIX) 40 MG TABLET    Take 1 tablet (40 mg total) by mouth daily as needed.   IPRATROPIUM-ALBUTEROL (DUONEB) 0.5-2.5 (3) MG/3ML SOLN    Take 3 mLs by nebulization 3 (three) times daily.   LEVOFLOXACIN (LEVAQUIN) 750 MG TABLET    Take 1 tablet (750 mg total) by mouth daily.   NITROGLYCERIN (NITROSTAT) 0.4 MG SL TABLET    Place 1 tablet (0.4 mg total) under the tongue every 5 (five) minutes as needed. For up to 3 doses.   PANTOPRAZOLE (PROTONIX) 40 MG TABLET    Take 1 tablet (40 mg total) by mouth daily. About 30 minutes before dinner time   PREDNISONE (DELTASONE) 20 MG TABLET    Take as directed   SILDENAFIL (VIAGRA) 100 MG TABLET    TAKE 1 TABLET BY MOUTH AS NEEDED FOR ERECTILE DYSFUNCTION.   SIMETHICONE (GAS-X EXTRA STRENGTH) 125 MG CAPS    Take 125 mg by mouth daily. Prn   UMECLIDINIUM BROMIDE (INCRUSE ELLIPTA) 62.5 MCG/INH AEPB    Inhale 1 puff into the lungs daily.  Modified Medications   No medications on file  Discontinued Medications   ALBUTEROL (PROVENTIL) (2.5 MG/3ML) 0.083%  NEBULIZER SOLUTION    Take 2.5 mg by nebulization every 6 (six) hours as needed for wheezing or shortness of breath. Reported on 09/03/2015

## 2015-09-30 ENCOUNTER — Encounter: Payer: Self-pay | Admitting: Allergy and Immunology

## 2015-09-30 ENCOUNTER — Ambulatory Visit (INDEPENDENT_AMBULATORY_CARE_PROVIDER_SITE_OTHER): Payer: Medicare Other | Admitting: Allergy and Immunology

## 2015-09-30 VITALS — BP 126/72 | HR 60 | Temp 98.0°F | Resp 20 | Ht 68.5 in | Wt 216.0 lb

## 2015-09-30 DIAGNOSIS — J441 Chronic obstructive pulmonary disease with (acute) exacerbation: Secondary | ICD-10-CM

## 2015-09-30 DIAGNOSIS — Z7952 Long term (current) use of systemic steroids: Secondary | ICD-10-CM | POA: Diagnosis not present

## 2015-09-30 DIAGNOSIS — J3089 Other allergic rhinitis: Secondary | ICD-10-CM | POA: Diagnosis not present

## 2015-09-30 DIAGNOSIS — J387 Other diseases of larynx: Secondary | ICD-10-CM | POA: Diagnosis not present

## 2015-09-30 DIAGNOSIS — J4540 Moderate persistent asthma, uncomplicated: Secondary | ICD-10-CM

## 2015-09-30 DIAGNOSIS — J45901 Unspecified asthma with (acute) exacerbation: Secondary | ICD-10-CM

## 2015-09-30 DIAGNOSIS — K219 Gastro-esophageal reflux disease without esophagitis: Secondary | ICD-10-CM

## 2015-09-30 MED ORDER — RANITIDINE HCL 300 MG PO CAPS: 300.0000 mg | ORAL_CAPSULE | Freq: Every evening | ORAL | Status: DC

## 2015-09-30 MED ORDER — ALBUTEROL SULFATE (2.5 MG/3ML) 0.083% IN NEBU: 2.5000 mg | INHALATION_SOLUTION | RESPIRATORY_TRACT | Status: DC | PRN

## 2015-09-30 MED ORDER — MONTELUKAST SODIUM 10 MG PO TABS: 10.0000 mg | ORAL_TABLET | Freq: Every day | ORAL | Status: DC

## 2015-09-30 NOTE — Patient Instructions (Addendum)
  1. Allergen avoidance measures  2. Treat and prevent inflammation with the following:   A. Symbicort 160 2 inhalations twice a day with spacer  B. incruse one inhalation 1 time per day  C. montelukast 10 mg one time per day  D. OTC Rhinocort one spray each nostril one time per day  3. Treat and prevent reflux with the following:   A. slowly taper and aim for no caffeine or chocolate consumption  B. continue pantoprazole 40 mg in a.m.  C. start ranitidine 300 mg in PM  4. If needed:   A. Proventil HFA 2 puffs every 4-6 hours  B. Albuterol (not ipratropium) nebulization every 4-6 hours  5. Submit for Xolair or Nucala  administration  6. Continue slow prednisone taper: At end of week decrease prednisone to 5 mg daily   7. Return to clinic in 2 weeks or earlier if problem  8. Blood - ANCA w/reflex, alpha 1 antitrypsin inhibitor level and phenotype

## 2015-09-30 NOTE — Progress Notes (Signed)
Dear Dr. Lenna Gilford,  Thank you for referring Larry Stone to the [facility] on 09/30/2015.   Below is a summation of this patient's evaluation and recommendations.  Thank you for your referral. I will keep you informed about this patient's response to treatment.   If you have any questions please to do hestitate to contact me.   Sincerely,  [name], MD [place]   ______________________________________________________________________    NEW PATIENT NOTE  Referring Provider: Noralee Space, MD Primary Provider: [name], MD Date of office visit: 09/30/2015    Subjective:   Chief Complaint:  Larry Stone (DOB: 09-30-1950) is a 65 y.o. male with a chief complaint of Allergies and Asthma  who presents to the clinic on 09/30/2015 with the following problems:  HPI: Larry Stone presents to this clinic in evaluation of breathing problems. He has a greater than 20 year history of having "asthma attacks". These attacks are manifested as shortness of breath and wheezing and chest tightness and coughing and a requirement for a bronchodilator multiple times per day. He gets these attacks a few times a year but over the course of the past year he has had them about 4 or 6 times and they have been very prolonged. His most recent attack has required him to use a systemic steroid for greater than a month. In addition to these attacks he also has some chronic coughing especially at nighttime and some postnasal drip and throat clearing and the sensation of an inability to clear out his throat. He has some minimal nasal symptoms without any anosmia or headache or ugly nasal discharge. He has apparently seen an ear nose and throat physician who has perform rhinoscopy and told him that his laryngeal anatomy is fine. This spring he was started on a proton pump inhibitor and Symbicort and an  anticholinergic agent while he has continuing to undergo a prolonged taper of systemic steroids. He does not really note any obvious provoking factor giving rise to his symptoms.  Past Medical History  Diagnosis Date  . CAD     --8/09: ETT normal --acute inferior lateral wall infarction in September 2004 treated medically.  --cardiac CT 2006  residual 50% left main stenosis and nonobstructive disease elsewhere. EF normal   --cardiac CT 05/2009. LM. <50% LAD. 50%  . HTN (hypertension)   . Hyperlipidemia   . Pharyngitis   . URI (upper respiratory infection)   . OSA (obstructive sleep apnea)   . Recurrent aspiration bronchitis/pneumonia ([place])   . Diabetes mellitus   . GERD (gastroesophageal reflux disease)   . Diverticulosis   . Colonic polyp   . DJD (degenerative joint disease)   . Low back pain syndrome   . Anxiety   . Myocardial infarction [facility]) 2004    Past Surgical History  Procedure Laterality Date  . Uvulopalatopharyngoplasty      surgery for OSA  . Cardiac catheterization        Medication List           ALPRAZolam 0.5 MG tablet  Commonly known as:  XANAX  TAKE 1 TABLET BY MOUTH 3 TIMES A DAY AS NEEDED     amLODipine 5 MG tablet  Commonly known as:  NORVASC  Take 1 tablet (5 mg total) by mouth daily.     ammonium lactate 12 % lotion  Commonly known as:  LAC-HYDRIN  Apply  topically as directed.     atorvastatin 40 MG tablet  Commonly known as:  LIPITOR  Take 1 tablet (40 mg total) by mouth daily.     b complex vitamins tablet  Take 1 tablet by mouth daily.     budesonide-formoterol 160-4.5 MCG/ACT inhaler  Commonly known as:  SYMBICORT  INHALE 2 PUFFS INTO THE LUNGS 2 (TWO) TIMES DAILY.     buPROPion 150 MG 12 hr tablet  Commonly known as:  WELLBUTRIN SR  Take 1 tablet (150 mg total) by mouth 2 (two) times daily.     carvedilol 12.5 MG tablet  Commonly known as:  COREG  TAKE 1 TABLET BY MOUTH TWICE A DAY WITH A MEAL     cetirizine 10 MG tablet    Commonly known as:  ZYRTEC  Take 1 tablet (10 mg total) by mouth daily.     clopidogrel 75 MG tablet  Commonly known as:  PLAVIX  Take 1 tablet (75 mg total) by mouth daily.     clotrimazole-betamethasone cream  Commonly known as:  LOTRISONE  APPLY AS DIRECTED     CoQ10 100 MG Caps  Take as directed     cyanocobalamin 2000 MCG tablet  Take 1 tablet (2,000 mcg total) by mouth daily.     furosemide 40 MG tablet  Commonly known as:  LASIX  Take 1 tablet (40 mg total) by mouth daily as needed.     HYDROcodone-homatropine 5-1.5 MG/5ML syrup  Commonly known as:  HYCODAN  Take 5 mLs by mouth every 6 (six) hours as needed for cough.     ipratropium-albuterol 0.5-2.5 (3) MG/3ML Soln  Commonly known as:  DUONEB  Take 3 mLs by nebulization 3 (three) times daily.     nitroGLYCERIN 0.4 MG SL tablet  Commonly known as:  NITROSTAT  Place 1 tablet (0.4 mg total) under the tongue every 5 (five) minutes as needed. For up to 3 doses.     pantoprazole 40 MG tablet  Commonly known as:  PROTONIX  Take 1 tablet (40 mg total) by mouth daily. About 30 minutes before dinner time     predniSONE 10 MG tablet  Commonly known as:  DELTASONE  Alternate 1 tablet and 1/2 tablet every other day.     sildenafil 100 MG tablet  Commonly known as:  VIAGRA  TAKE 1 TABLET BY MOUTH AS NEEDED FOR ERECTILE DYSFUNCTION.     umeclidinium bromide 62.5 MCG/INH Aepb  Commonly known as:  INCRUSE ELLIPTA  Inhale 1 puff into the lungs daily.     Vitamin D3 5000 units Caps  Take one tablet by mouth daily        Allergies  Allergen Reactions  . Ace Inhibitors Swelling  . Angiotensin Receptor Blockers Swelling  . Aspirin Hives  . Codeine Nausea And Vomiting  . Irbesartan Other (See Comments)    REACTION: allergic to ARBs w/ angioedema  . Ramipril Other (See Comments)    REACTION: Allergic to ACE's w/ angioedema...    Review of systems negative except as noted in HPI / PMHx or noted below:  Review of  Systems  Constitutional: Negative.   HENT: Negative.   Eyes: Negative.   Respiratory: Negative.   Cardiovascular: Negative.   Gastrointestinal: Negative.   Genitourinary: Negative.   Musculoskeletal: Negative.   Skin: Negative.   Neurological: Negative.   Endo/Heme/Allergies: Negative.   Psychiatric/Behavioral: Negative.     Family History  Problem Relation Age of Onset  . Colon cancer  Father   . Stroke Mother   . Heart disease Mother   . Hypertension Sister   . Diabetes Sister     Social History   Social History  . Marital Status: Married    Spouse Name: N/A  . Number of Children: 1  . Years of Education: N/A   Occupational History  . [employer] History Main Topics  . Smoking status: Former Smoker -- 1.00 packs/day for 20 years    Types: Cigarettes, Cigars    Quit date: 05/08/1998  . Smokeless tobacco: Never Used     Comment: quit in 2005  . Alcohol Use: 0.6 oz/week    1 Standard drinks or equivalent per week     Comment: social drinker  . Drug Use: No  . Sexual Activity: Yes   Other Topics Concern  . Not on file   Social History Narrative    Environmental and Social history  Lives in a house with a dry environment, dogs located outside the household, no carpeting in the bedroom, no plastic on the better pillow, and no smoking ongoing with inside the household.   Objective:   Filed Vitals:   09/30/15 0841  BP: 126/72  Pulse: 60  Temp: 98 F (36.7 C)  Resp: 20   Height: 5' 8.5" (174 cm) Weight: 216 lb (97.977 kg)  Physical Exam  Constitutional: He is well-developed, well-nourished, and in no distress.  HENT:  Head: Normocephalic.  Right Ear: Tympanic membrane, external ear and ear canal normal.  Left Ear: Tympanic membrane, external ear and ear canal normal.  Nose: Nose normal. No mucosal edema or rhinorrhea.  Mouth/Throat: Uvula is midline, oropharynx is clear and moist and mucous membranes are normal. No  oropharyngeal exudate.  Eyes: Conjunctivae are normal.  Neck: Trachea normal. No tracheal tenderness present. No tracheal deviation present. No thyromegaly present.  Cardiovascular: Normal rate, regular rhythm, S1 normal, S2 normal and normal heart sounds.   No murmur heard. Pulmonary/Chest: Breath sounds normal. No stridor. No respiratory distress. He has no wheezes. He has no rales.  Musculoskeletal: He exhibits no edema.  Lymphadenopathy:       Head (right side): No tonsillar adenopathy present.       Head (left side): No tonsillar adenopathy present.    He has no cervical adenopathy.  Neurological: He is alert. Gait normal.  Skin: No rash noted. He is not diaphoretic. No erythema. Nails show no clubbing.  Psychiatric: Mood and affect normal.     Diagnostics: Allergy skin tests were performed. He demonstrated hypersensitivity against house dust mite, cat, dog, and Candida albicans. He did not demonstrate any hypersensitivity to other molds including Aspergillus.  Spirometry was performed and demonstrated an FEV1 of 1.49 @ 46 % of predicted. Following the administration of nebulized albuterol his FEV1 did not improve.  The patient had an Asthma Control Test with the following results:  .    Blood tests obtained on 08/12/2015 identified normal hepatic and renal function, a white blood cell count of 15.7 with 200 eosinophils, hemoglobin of 14.9 with an MCV of 94.8 and a platelet count of 165. A IgE level obtained on 03/05/2014 was 1497 KU/ML. IgG was 989, IgA 160, and IgM 26 MG/DL on 12/13/2013.  Results of a chest x-ray obtained on 08/12/2015 identified mild right perihilar and right lower lobe infiltrate and right lateral pleural thickening.  A chest CT scan obtained 02/02/2010 identified mild central lobular emphysema.   Assessment and  Plan:    1. Acute exacerbation of COPD with asthma ([place])   2. LPRD (laryngopharyngeal reflux disease)   3. Other allergic rhinitis   4. Long  term current use of systemic steroids   5. Moderate persistent asthma, uncomplicated     1. Allergen avoidance measures  2. Treat and prevent inflammation with the following:   A. Symbicort 160 2 inhalations twice a day with spacer  B. incruse one inhalation 1 time per day  C. montelukast 10 mg one time per day  D. OTC Rhinocort one spray each nostril one time per day  3. Treat and prevent reflux with the following:   A. slowly taper and aim for no caffeine or chocolate consumption  B. continue pantoprazole 40 mg in a.m.  C. start ranitidine 300 mg in PM  4. If needed:   A. Proventil HFA 2 puffs every 4-6 hours  B. Albuterol (not ipratropium) nebulization every 4-6 hours  5. Submit for omalizumab or mepolizumab administration depending on insurance coverage  6. Continue slow prednisone taper: At end of week decrease prednisone to 5 mg daily   7. Return to clinic in 2 weeks or earlier if problem  8. Blood - ANCA w/reflex, Alpha 1 antitypsinase inhibitor level and phenotype  Larry Stone appears to have significant inflammation of his respiratory tract most likely secondary to a combination of atopic respiratory disease, prolonged tobacco abuse which forcefully has discontinued, and reflux-induced respiratory disease for which he will utilize the therapy mentioned above including anti-inflammatory medications for his respiratory tract and aggressive therapy directed against reflux. Given the fact that he appears to be somewhat steroid-dependent at this point he may be a candidate for omalizumab or mepolizumab depending on whether or not we can get this approved through his insurance. There is the possibility that some of his disease state may be tied up with one of the more uncommon forms of respiratory tract inflammation including hypersensitivity pneumonitis, allergic bronchopulmonary aspergillosis, and Churg-Strauss vasculitis. Before we go marching off on investigation for these uncommon  etiologic issues will just have him utilize therapy mentioned above and see what happens as we move forward. Hopefully by utilizing a collection of anti-inflammatory medications on a biological agent we can get all his respiratory tract symptoms under good control. I'll see him back in this clinic in 2 weeks and I'll make an attempt to further consolidate his prednisone dose at that point.  [name], MD [facility]

## 2015-10-01 NOTE — Addendum Note (Signed)
Addended by: Angelica Ran on: 10/01/2015 03:50 PM   Modules accepted: Orders

## 2015-10-02 NOTE — Addendum Note (Signed)
Addended by: Angelica Ran on: 10/02/2015 12:30 PM   Modules accepted: Orders

## 2015-10-03 LAB — ANCA SCREEN W REFLEX TITER: ANCA Screen: NEGATIVE

## 2015-10-06 LAB — ALPHA-1-ANTITRYPSIN: A1 ANTITRYPSIN SER: 147 mg/dL (ref 83–199)

## 2015-10-07 LAB — ALPHA-1 ANTITRYPSIN PHENOTYPE: A1 ANTITRYPSIN: 152 mg/dL (ref 83–199)

## 2015-10-14 ENCOUNTER — Ambulatory Visit (INDEPENDENT_AMBULATORY_CARE_PROVIDER_SITE_OTHER): Payer: Medicare Other | Admitting: Allergy and Immunology

## 2015-10-14 ENCOUNTER — Encounter: Payer: Self-pay | Admitting: Allergy and Immunology

## 2015-10-14 VITALS — BP 128/90 | HR 76 | Resp 20

## 2015-10-14 DIAGNOSIS — J3089 Other allergic rhinitis: Secondary | ICD-10-CM | POA: Diagnosis not present

## 2015-10-14 DIAGNOSIS — J441 Chronic obstructive pulmonary disease with (acute) exacerbation: Secondary | ICD-10-CM | POA: Diagnosis not present

## 2015-10-14 DIAGNOSIS — J387 Other diseases of larynx: Secondary | ICD-10-CM | POA: Diagnosis not present

## 2015-10-14 DIAGNOSIS — K219 Gastro-esophageal reflux disease without esophagitis: Secondary | ICD-10-CM

## 2015-10-14 DIAGNOSIS — Z7952 Long term (current) use of systemic steroids: Secondary | ICD-10-CM

## 2015-10-14 DIAGNOSIS — J45901 Unspecified asthma with (acute) exacerbation: Secondary | ICD-10-CM

## 2015-10-14 NOTE — Progress Notes (Signed)
Follow-up Note  Referring Provider: Cassandria Anger, MD Primary Provider: [name], MD Date of Office Visit: 10/14/2015  Subjective:   [name] (DOB: Mar 11, 1951) is a 65 y.o. male who returns to the [facility] on 10/14/2015 in re-evaluation of the following:  HPI: Shea returns to this clinic in reevaluation of his respiratory tract disease. Overall he has doing very well. He has no complaints revolving around his lower or upper respiratory tract. He does not use a bronchodilator. He had no issues with his throat no issues with reflux. He has performed allergen avoidance measures to some degree. He's been consistently using his medical therapy including prednisone 5 mg every day.    Medication List           albuterol (2.5 MG/3ML) 0.083% nebulizer solution  Commonly known as:  PROVENTIL  Take 3 mLs (2.5 mg total) by nebulization every 4 (four) hours as needed for wheezing or shortness of breath.     ALPRAZolam 0.5 MG tablet  Commonly known as:  XANAX  TAKE 1 TABLET BY MOUTH 3 TIMES A DAY AS NEEDED     amLODipine 5 MG tablet  Commonly known as:  NORVASC  Take 1 tablet (5 mg total) by mouth daily.     ammonium lactate 12 % lotion  Commonly known as:  LAC-HYDRIN  Apply topically as directed.     atorvastatin 40 MG tablet  Commonly known as:  LIPITOR  Take 1 tablet (40 mg total) by mouth daily.     b complex vitamins tablet  Take 1 tablet by mouth daily.     budesonide-formoterol 160-4.5 MCG/ACT inhaler  Commonly known as:  SYMBICORT  INHALE 2 PUFFS INTO THE LUNGS 2 (TWO) TIMES DAILY.     buPROPion 150 MG 12 hr tablet  Commonly known as:  WELLBUTRIN SR  Take 1 tablet (150 mg total) by mouth 2 (two) times daily.     carvedilol 12.5 MG tablet  Commonly known as:  COREG  TAKE 1 TABLET BY MOUTH TWICE A DAY WITH A MEAL     cetirizine 10 MG tablet  Commonly known as:  ZYRTEC  Take 1 tablet (10 mg total) by mouth daily.     clopidogrel 75 MG tablet  Commonly known as:  PLAVIX  Take 1 tablet (75 mg total) by mouth daily.     clotrimazole-betamethasone cream  Commonly known as:  LOTRISONE  APPLY AS DIRECTED     CoQ10 100 MG Caps  Take as directed     cyanocobalamin 2000 MCG tablet  Take 1 tablet (2,000 mcg total) by mouth daily.     furosemide 40 MG tablet  Commonly known as:  LASIX  Take 1 tablet (40 mg total) by mouth daily as needed.     HYDROcodone-homatropine 5-1.5 MG/5ML syrup  Commonly known as:  HYCODAN  Take 5 mLs by mouth every 6 (six) hours as needed for cough.     montelukast 10 MG tablet  Commonly known as:  SINGULAIR  Take 1 tablet (10 mg total) by mouth at bedtime.     nitroGLYCERIN 0.4 MG SL tablet  Commonly known as:  NITROSTAT  Place 1 tablet (0.4 mg total) under the tongue every 5 (five) minutes as needed. For up to 3 doses.     pantoprazole 40 MG tablet  Commonly known as:  PROTONIX  Take 1 tablet (40 mg total) by mouth daily. About 30 minutes before dinner time  predniSONE 10 MG tablet  Commonly known as:  DELTASONE  Alternate 1 tablet and 1/2 tablet every other day.     ranitidine 300 MG capsule  Commonly known as:  ZANTAC  Take 1 capsule (300 mg total) by mouth every evening.     sildenafil 100 MG tablet  Commonly known as:  VIAGRA  TAKE 1 TABLET BY MOUTH AS NEEDED FOR ERECTILE DYSFUNCTION.     umeclidinium bromide 62.5 MCG/INH Aepb  Commonly known as:  INCRUSE ELLIPTA  Inhale 1 puff into the lungs daily.     Vitamin D3 5000 units Caps  Take one tablet by mouth daily        Past Medical History  Diagnosis Date  . CAD     --8/09: ETT normal --acute inferior lateral wall infarction in September 2004 treated medically.  --cardiac CT 2006  residual 50% left main stenosis and nonobstructive disease elsewhere. EF normal   --cardiac CT 05/2009. LM. <50% LAD. 50%  . HTN (hypertension)   . Hyperlipidemia   . Pharyngitis   . URI (upper respiratory infection)     . OSA (obstructive sleep apnea)   . Recurrent aspiration bronchitis/pneumonia ([place])   . Diabetes mellitus   . GERD (gastroesophageal reflux disease)   . Diverticulosis   . Colonic polyp   . DJD (degenerative joint disease)   . Low back pain syndrome   . Anxiety   . Myocardial infarction [facility]) 2004    Past Surgical History  Procedure Laterality Date  . Uvulopalatopharyngoplasty      surgery for OSA  . Cardiac catheterization      Allergies  Allergen Reactions  . Ace Inhibitors Swelling  . Angiotensin Receptor Blockers Swelling  . Aspirin Hives  . Codeine Nausea And Vomiting  . Irbesartan Other (See Comments)    REACTION: allergic to ARBs w/ angioedema  . Ramipril Other (See Comments)    REACTION: Allergic to ACE's w/ angioedema...    Review of systems negative except as noted in HPI / PMHx or noted below:  Review of Systems  Constitutional: Negative.   HENT: Negative.   Eyes: Negative.   Respiratory: Negative.   Cardiovascular: Negative.   Gastrointestinal: Negative.   Genitourinary: Negative.   Musculoskeletal: Negative.   Skin: Negative.   Neurological: Negative.   Endo/Heme/Allergies: Negative.   Psychiatric/Behavioral: Negative.      Objective:   Filed Vitals:   10/14/15 1101  BP: 128/90  Pulse: 76  Resp: 20          Physical Exam  Constitutional: He is well-developed, well-nourished, and in no distress.  HENT:  Head: Normocephalic.  Right Ear: Tympanic membrane, external ear and ear canal normal.  Left Ear: Tympanic membrane, external ear and ear canal normal.  Nose: Nose normal. No mucosal edema or rhinorrhea.  Mouth/Throat: Uvula is midline, oropharynx is clear and moist and mucous membranes are normal. No oropharyngeal exudate.  Eyes: Conjunctivae are normal.  Neck: Trachea normal. No tracheal tenderness present. No tracheal deviation present. No thyromegaly present.  Cardiovascular: Normal rate, regular rhythm, S1 normal, S2 normal and  normal heart sounds.   No murmur heard. Pulmonary/Chest: Breath sounds normal. No stridor. No respiratory distress. He has no wheezes. He has no rales.  Musculoskeletal: He exhibits no edema.  Lymphadenopathy:       Head (right side): No tonsillar adenopathy present.       Head (left side): No tonsillar adenopathy present.    He has no cervical adenopathy.  Neurological: He is alert. Gait normal.  Skin: No rash noted. He is not diaphoretic. No erythema. Nails show no clubbing.  Psychiatric: Mood and affect normal.    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 He had a negative ANCA screen   Spirometry was performed and demonstrated an FEV1 of 1.98 at 61 % of predicted. His previous FEV1 was 1.49  The patient had an Asthma Control Test with the following results:  .    Assessment and Plan:   1. Acute exacerbation of COPD with asthma ([place])   2. LPRD (laryngopharyngeal reflux disease)   3. Long term current use of systemic steroids   4. Other allergic rhinitis     1. Continue to Allergen avoidance measures  2. Continue to Treat and prevent inflammation with the following:   A. Symbicort 160 2 inhalations twice a day with spacer  B. incruse one inhalation 1 time per day  C. montelukast 10 mg one time per day  D. OTC Rhinocort one spray each nostril one time per day  3. Continue to Treat and prevent reflux with the following:   A. continue no caffeine or chocolate consumption  B. continue pantoprazole 40 mg in a.m.  C. continue ranitidine 300 mg in PM  4. If needed:   A. Proventil HFA 2 puffs every 4-6 hours  B. Albuterol (not ipratropium) nebulization every 4-6 hours  5. Work through the financial issue concerning Ken Caryl administration  6. Continue slow prednisone taper: Decrease prednisone to 5 mg every other day for 2 weeks and then discontinue  7. Return to clinic in 10 weeks or  earlier if problem  8. Obtain fall flu vaccine  Gabrel is doing better for his initial 2 weeks of medical therapy and I would like to continue him on this plan for a full 12 weeks and thus I'll see him back in this clinic at 10 weeks. We'll attempt to discontinue his systemic steroid over the course of the next 2 weeks while he continues to use aggressive therapy directed against both inflammation and reflux. I did recommend that he work through the issue concerning Xolair administration. I think Xolair will allow him to decrease his medical therapy dramatically if he does respond to this agent.    Allena Katz, MD [facility]

## 2015-10-14 NOTE — Patient Instructions (Signed)
  1. Continue to Allergen avoidance measures  2. Continue to Treat and prevent inflammation with the following:   A. Symbicort 160 2 inhalations twice a day with spacer  B. incruse one inhalation 1 time per day  C. montelukast 10 mg one time per day  D. OTC Rhinocort one spray each nostril one time per day  3. Continue to Treat and prevent reflux with the following:   A. continue no caffeine or chocolate consumption  B. continue pantoprazole 40 mg in a.m.  C. continue ranitidine 300 mg in PM  4. If needed:   A. Proventil HFA 2 puffs every 4-6 hours  B. Albuterol (not ipratropium) nebulization every 4-6 hours  5. Work through the issue concerning Ventress administration  6. Continue slow prednisone taper: Decrease prednisone to 5 mg every other day for 2 weeks and then discontinue  7. Return to clinic in 10 weeks or earlier if problem  8. Obtain fall flu vaccine

## 2015-10-15 ENCOUNTER — Encounter: Payer: Self-pay | Admitting: Pulmonary Disease

## 2015-10-15 ENCOUNTER — Ambulatory Visit (INDEPENDENT_AMBULATORY_CARE_PROVIDER_SITE_OTHER): Payer: Medicare Other | Admitting: Pulmonary Disease

## 2015-10-15 VITALS — BP 142/84 | HR 60 | Temp 97.7°F | Ht 70.0 in | Wt 219.0 lb

## 2015-10-15 DIAGNOSIS — J449 Chronic obstructive pulmonary disease, unspecified: Secondary | ICD-10-CM | POA: Diagnosis not present

## 2015-10-15 DIAGNOSIS — I2510 Atherosclerotic heart disease of native coronary artery without angina pectoris: Secondary | ICD-10-CM

## 2015-10-15 DIAGNOSIS — I10 Essential (primary) hypertension: Secondary | ICD-10-CM | POA: Diagnosis not present

## 2015-10-15 DIAGNOSIS — G4733 Obstructive sleep apnea (adult) (pediatric): Secondary | ICD-10-CM

## 2015-10-15 DIAGNOSIS — J4489 Other specified chronic obstructive pulmonary disease: Secondary | ICD-10-CM

## 2015-10-15 DIAGNOSIS — R768 Other specified abnormal immunological findings in serum: Secondary | ICD-10-CM

## 2015-10-15 DIAGNOSIS — R7689 Other specified abnormal immunological findings in serum: Secondary | ICD-10-CM

## 2015-10-15 DIAGNOSIS — R760 Raised antibody titer: Secondary | ICD-10-CM

## 2015-10-15 NOTE — Progress Notes (Signed)
Subjective:    Patient ID: Larry Stone, male    DOB: March 22, 1951, 65 y.o.   MRN: [id number]  HPI 65 y/o WM here for a followup visit... he has multiple medical problems as noted below...  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 and DrPlotnikov for Primary Care- Chol, Impaired fasting glucose, etc... ~  SEE PREV EPIC NOTES FOR OLDER DATA >>    CT Chest 01/2010>>  1.42mm right middle lobe lung nodule is similar back to the abdominal CT of 02/16/2003, consistent with benignity, most likely a subpleural lymph node;  2. No acute process in the chest;  3 Distal esophageal wall thickening again suggests esophagitis;  4.Mild centrilobular emphysema;  5.Age advanced atherosclerosis.  CXR 1/14 showed normal heart size, clear lungs, chr right rib deformity (old fx), NAD...  LABS 1/14:  FLP- at goals on Lip40+Niasp;  Chems- wnl;  CBC- wnl;  TSH=1.93;  PSA=0.39;  Testos=307 (350-890) on Testim 2tubes/d;  B12=1005 (211-911) on B12 202mcg/d...  LABS 4/15:  FLP- at goals on Lip40;  Chems- ok w/ BS=115, A1c=6.0, Cr=1.2;  CBC- wnl;  TSH=1.55;  PSA=0.39...   CXR 8/15 showed normal heart size, clear lungs w/o infiltrates, old healed right rib fxs, NAD...   PFT 8/15 showed FVC=2.32 (50%), FEV1=1.63 (45%), %1sec=70, mid-flows=33% predicted; suggestive of GOLD Stage 3 COPD but can't r/o superimposed restriction w/o lung volume measurement...  LABS 8/15:  SPE/IEP w/o monoclonal gammopathy; Quant Ig's showed normal IgG, normal IgA, low IgM at 26 (40-250), and a hi IgE at 1362 => we will follow w/ RAST panel & consider Xolair Rx...  RAST Panel 11/15 => returned w/ IgE level = 1497 and Allergen panel pos for some molds, grasses, trees, ragweed, cats>dogs; and Food panel pos for Milk> Wheat> Shrimp/ Tomato/ Peanut, etc.  ~  March 18, 2014:  75mo ROV & f/u dyspnea> Larry Stone states that the [facility] was  $400 at the Pharm & they/he didn't call for replacement inhaler, states he filled it one time & ran out several wks ago, we discussed what to do in that eventuality (call us to let us know & get a different Rx), we will try SYMBICORT160-2spBid & he will let us know what his [employer] co-pay is for this med;  Similarly he did not return to get the RAST tests done, but he informs me that they were drawn several days ago w/ blood work for [employer]- results pending...  We reviewed his prev Hx/ symptoms, exam (chest is clear today), and evaluation; all questions answered, we decided to try the Symbicort160 regularly over the next 29mo & plan f/u Full-PFTs in the future;  In the meanwhile we will await the results of the RAST tests and decide regarding Xolair vs Allergy evaluation...    We reviewed prob list, meds, xrays and labs> see below for updates >> He was given the 2015 flu vaccine recently and the Prevnar-13 vaccination as well...  LABS 11/15:  FLP- at goals on Lip40;  Chems- wnl w/ BS=105, A1c=6.1;  CBC- wnl w/ Hg=15.1;  TSH=1.77, PSA=0.50...   RAST Test 11/15:  returned w/ IgE level = 1497 and Allergen panel pos for some molds, grasses, trees, ragweed, cats>dogs; and Food panel pos for Milk> Wheat> Shrimp/ Tomato/ Peanut, etc... We discussed avoidance strategies and other options- he is interested in Xolair & we will proceed w/ the paperwork for Xolair  approval...   ~  October 15, 2014:  30mo ROV & Larry Stone is still awaiting insurance approval for Xolair!  He reports removing 2 ticks from his privates recently!  Weight down 12# on diet and he notes some improvement in his dyspnea- now just noted w/ intense activ he says eg- climbing/ stairs/ etc but walking is ok...     OSA> s/p UPPP surg 2004; uses Transderm scope prn dizziness, uses "breathe right nasal strips" periodically; resting OK, no daytime hypersomnolence...    COPD & HxBronchitis> on Symbicort160-2Bid & Zyrtek prn; doing satis w/o cough,  phlegm, etc...    Elev IgE at 1362 & 1497; see RAST results above;  We are awaiting approval for Xolair...    His last Cards check was 12/25 w/ DrMcAlhany> Hx MI in 2004 w/ resid 50% Lmain; had CardiacCT 2011 w/ 50% LAD, Myoview 2015 w/ inferolat scar, no ischemia, EF=52%; clinically stable & not c/o CP & no changes made...    He had medical f/u DrPlotnikov 5/16> HBP, CAD, HL, anxiety- all stable on meds and no changes made...    He continues to f/u w/ Podiatry- DrRegal> seen 4/16 for heel pain, plantar faciitis- injected & given brace + oral anti-inflamm rx... We reviewed prob list, meds, xrays and labs> see below for updates >>  ADDENDUM>>  [name] was denied by his insurance company...  ~  April 04, 2015:  2mo ROV & Larry Stone called for an add-on appt >  He is c/o a 7mo hx sudden onset SOB/DOE, fatigue, cough & chest congestion w/ sl beige phlegm, no hemoptysis, no CP x sore from coughing, and no f/c/s;  We called in Pred dosepak but he says no better, not resting, ?cough worse Qhs, incr SOB & he denies any reflux/ GERD, etc... He has been using the Symbicort160-2spBid & Zyrtek10... EXAM shows Afeb, VSS, O2sat=98% on RA;  HEENT- neg, mallamapti2;  Chest- decr BS at bases, dry cough, clear w/o w/r/r;  Heart- RR w/o m/r/g;  Abd- soft, neg;  Ext- neg w/o c/c/e...   CXR 04/04/15 showed norm heart size, clear lungs, old right rib fx, NAD...  IMP/PLAN>>  Larry Stone appears to have a refractory COPD exac & we discussed additional treatment w/ ZPak, Depo80/Medrol8mg  tapering sched, add INCRUSE one sp daily, Mucinex 1200mg Bid + fluids, and OK to use mother's old NEB machine  W/ Albut Tid;  Also wrote for [facility] for prn use...  ~  May 01, 2015:  22mo ROV & Larry Stone reports that he is better overall from his refractory AB but cough is lingering w/ a definite night-time pattern;  Notes some wheezing & chest soreness;  He has been on mother's NEB machine Bid, Symbicort160-2spBid, Incruse daily, Medrol8 taper- down to  1/2Qod, Hycodan prn;  We decided to continue the Medrol4mg Qod & institute a vigorous antireflux regimen- Protonix40 taken 57min before dinner, NPO after dinner, elev HOB 6"... EXAM shows Afeb, VSS, O2sat=98% on RA;  HEENT- neg, mallamapti2;  Chest- decr BS at bases, min cough & exp wheezing w/o consolidation;  Heart- RR w/o m/r/g;  Abd- soft, neg;  Ext- neg w/o c/c/e...  IMP/PLAN>>  Larry Stone is improved but w/ persist symptoms esp cough at night- we decided to continue the Medrol4mg  Qod & institute a vigorous antireflux regimen... Plan ROV 6-8wks.  ~  June 30, 2015:  5mo ROV & Larry Stone reports that his breathing is better, min chest congestion & non-productive cough, still notes occas wheezing; no CP, no tightness, no f/c/s;  NOTE>  HE HAS STOPPED THE MEDROL& STOPPED THE NEBS on his own, still using the Symbicort & Incruse regularly; rec to add MUCINEX600-2Bid w/ fluids & [facility]...  EXAM shows Afeb, VSS, O2sat=98% on RA;  HEENT- clears throat/ ?VCD, mallamapti2;  Chest- decr BS at bases, min cough & exp wheezing w/o consolidation;  Heart- RR w/o m/r/g;  Abd- soft, neg;  Ext- neg w/o c/c/e...  IMP/PLAN>>  We will refer to ENT for completeness & upper airway symptoms, throat clearing, etc; he needs to maintain a vigorous antireflux regimen + Symbicort/ Incruse/ Albut NEBS...   ~  August 12, 2015:  6wk ROV & add-on appt requested due to incr dyspnea>  Larry Stone reports that he was doing satis on the Symbicort160-2spBid & Incruse daily (having prev stopped the NEB rx and Medrol) but called yest w/ temp 103 he says, felt "out of it" w/ chills, aching/sore, fatigued/ weak and incr cough w/o much sput production;  He says he saw ENT in the interim- nothing wrong, no change in rx recommended (we do not have note from them);  Note- his wt is down 11# to 211# today...    OSA> s/p UPPP surg 2004; uses Transderm scope prn dizziness, uses "breathe right nasal strips" periodically; resting OK, no  daytime hypersomnolence...    COPD & HxBronchitis> on Symbicort160-2Bid & Incruse daily; has his mother's old NEB machine & using Albut prn;  Now c/o dry cough, T103, etc (as above)=>     Elev IgE at 1362 & 1497; see RAST results above;  His insurance denied Xolair rx; he has been on steroids and uses Zyrtek10...    His last Cards check was 05/08/15 w/ DrMcAlhany> Hx MI in 2004 w/ resid 50% Lmain; had CardiacCT 2011 w/ 50% LAD, Myoview 2015 w/ inferolat scar, no ischemia, EF=52%;  Myoview 6/15 showed inferolat scarno ischemia, EF=52%; clinically stable & not c/o CP & no changes made...    He had medical f/u DrPlotnikov 5/16> HBP, CAD, HL, anxiety- all stable on meds and no changes made...    He continues to f/u w/ Podiatry- DrRegal> seen 4/16 for heel pain, plantar faciitis- injected & given brace + oral anti-inflamm rx... EXAM shows Afeb, VSS, O2sat=92% on RA;  HEENT- clears throat/ ?VCD, mallamapti2;  Chest- decr BS at bases, min cough & no w/r/r/ or consolidation;  Heart- RR w/o m/r/g;  Abd- soft, neg;  Ext- neg w/o c/c/e...   CXR 08/12/15> norm heart size, no acute pulm infiltrates/ effusion/ etc, right lat pleural thickening; degen changes in spine...  LABS 08/12/15> Chems- wnl  X BS=121, Cr=1.2, TBili=1.8;  BNP= 35;  CBC- ok x WBC=15.7, Hg=14.9;  Sed=41; IMP/PLAN>>  We decided to treat w/ LEVAQUIN x7d, Depo80, PRED20mg - 7d tapering sched; use NEB w/ DUONEB Tid followed by Symbicort & Incruse; continue to follow the vigorous antireflux regimen; ROV in 3 weeks time...  ~  Sep 03, 2015:  3wk ROV & Larry Stone reports some improvement on his regimen of PRED20mg -1/2 tab Qam now, NEBs w/ Duoneb Tid, Symbicort160-2spBid, Incruse daily; he notes "improved, but not well" w/ CC= dry cough, in paroxysms, hacking, small amt thick sputum, no f/c/s, no CP, no SOB x w/ the coughing spells; he notes that he coughs in restaurants, notes the coughing spells occur w/o rhyme or reason, better if he wals outside...  EXAM shows  Afeb, VSS, O2sat=98% on RA;  HEENT- neg, mallamapti2;  Chest- decr BS at bases, clear, no w/r/r/ or  consolidation;  Heart- RR w/o m/r/g;  Abd- soft, neg;  Ext- neg w/o c/c/e...  IMP/PLAN>>  Larry Stone needs a new NEBULIZER machine; he is asked to wean the Pred from 10mg /d to 10mg  alternating w/ 5mg  QOD; contuinue the Duoneb tid, Symbicort-2Bid, Incruse daily, Hycodan prn; he needs to redouble his efforts at the antireflux regimen;  We will refer to DrKozlow for his assessment of refractory asthmatic bronchitis- recall the RAST was abn 2015 & IgE level ~1400 but insurance denied Xolair... NOTE>>  We gave him the second Pneumovax 23 today & this gets him up-to-date on his immuniz.  ~  October 15, 2015:  6wk ROV & Larry Stone has established w/ DrKozlow for allergy/immunology work up & to try & get him approved for Xolair;  He has done quite a bit of reading on the internet & asked several good questions about his condition- answered to the best of my ability;  Currently taking Symbicort160-2spBid, Incruse once daily, Pred 10mg  tabs tapering (tapering off more quickly on DrKozlow's protocol), Singulair10, NEBS w/ ?Albut2.5 prn (we had him on Duoneb Tid), Zyrtek10, Hycodan prn;  DrKozlow has started the paperwork for Xolair shots thru his new insurance;  He reports that his breathing is better- sl cough, sm amt beige sput, no hemoptysis, improved SOB, no CP, no recent f/c/s, etc...    EXAM shows Afeb, VSS, O2sat=97% on RA;  HEENT- clears throat min/ ?VCD, mallamapti2;  Chest- decr BS at bases, no cough & no w/r/r;  Heart- RR w/o m/r/g;  Abd- soft, neg;  Ext- neg w/o c/c/e...   Spirometry x2 by DrKozlow 6/13 & 6/27 showed FEV1 improved 33% to 1.98L (61% pred) on his regimen...  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 IMP/PLAN>>  COPD w/ Asthma, improved w/ adjustments from DrKozlow, they are continuing his COPD meds Symbicort/ Incruse), added Singulair, and changed Duoneb to albut prn; he has  also highlighted the need for anti-reflux regimen (ProtonixAM & Ranitadine-PM); hoping for approval of Xolair, they plan f/u in 2-48mo and he will call us in the interim for any issues...            Problem List:     ALLERGIC to ASA w/ hives, & ACE inhibitors/ ARBs too w/ angioedema.  OBSTRUCTIVE SLEEP APNEA (ICD-327.23) - s/p UPPP surgery in 2004 by DrRedman...  states he's doing OK, denies snoring, no daytime hypersom, wife not complaining... uses ZYRTEK for allergy symtoms. ~  1/13: He has started using the "breathe right nasal strips" qhs for recurrent snoring; still says he rests well, denies daytime hypersomnolence, & declines offer for repeat sleep study to check... ~  7/14:  He reports resting well, no daytime symptoms or sleep issues...  ~  8/15:  He denies sleep disordered breathing... ~  6/16:  He remains asymptomatic- no snoring, daytime hypersomnolence, etc...  Hx of BRONCHITIS, RECURRENT (ICD-491.9) - he is an ex-smoker, prev 1 ppd for 60yrs, quit in 2004. COPD, mixed type- w/ mild centrilobular emphysema on CT Chest 10/11, & chronic bronchitis clinically w/ recurrent infections... ~  Hx tiny RML nodule seen on Cardiac CT 2/11 & apparently unchanged from old [place] 2004... ~  10/11: f/u CT Chest for f/u tiny 15mm nodule RML- unchanged xyrs & benign, mild centilob emphysema, +coronary calcif, NAD... ~  1/13: he denies breathing problems, intercurrent infections, etc... ~  1/14:  CXR 1/14 showed normal heart size, clear lungs, chr right rib deformity (old fx), NAD.  ~  8/15: he was concerned about several recent bouts of bronchitis requiring ZPak for Rx> we checked labs and Immunoglobulins =>   Decided to start inhaler w/ DULERA100-2spBid...  ~  CXR 8/15 showed normal heart size, clear lungs w/o infiltrates, old healed right rib fxs, NAD. ~  PFT 8/15 showed FVC=2.32 (50%), FEV1=1.63 (45%), %1sec=70, mid-flows=33% predicted; suggestive of GOLD Stage 3 COPD but can't r/o superimposed  restriction w/o lung volume measurement. ~  LABS 8/15 showed SPE/IEP w/o monoclonal gammopathy; Quant Ig's showed normal IgG, normal IgA, low IgM at 26 (40-250), and a hi IgE at 1362 => we will follow w/ RAST panel & consider Xolair Rx. ~  11/15: he didn't fill the [facility] due to cost- we decided to try SYMBICORT160-2spBid... ~  LABS 11/15 showed elev IgE level and mixed RAST panel allergens; discussed avoidance & he is a good cand for [employer] => insurance would NOT approve this med... ~  12/16: on Symbicort160-2spBid + using his mother's nebulizer +Albut; presented w/ refractory COPD exac=> treated w/ ZPak, Medrol taper, add Incruse & use NEB Tid, mucinex + Hycodan...  ~  1/17: on mother's NEB machine Bid, Symbicort160-2spBid, Incruse daily, Medrol8 taper- down to 1/2Qod, Hycodan prn;  We decided to continue the Medrol4mg Qod & institute a vigorous antireflux regimen- Protonix40 taken 25min before dinner, NPO after dinner, elev HOB 6". ~  3/17:  He is improved and backed off on many of his meds... ~  4/17:  He presents w/ recurrent AB => treated w/ Levaquin, Prednisone20mg - 7d tapering sched, NEBs w/ DUONEB Tid followed by Symbicort160 & Incruse...   MEDICAL PROBLEMS PER DrPlotnikov >>   HYPERTENSION (ICD-401.9) - on COREG 12.5mg Bid, AMLODIPINE 5mg /d, & LASIX 40mg - only as needed (& he hasn't needed any); BP= 116/72 & he denies CP, palpit, ch in SOB, edema...  ARTERIOSCLEROTIC HEART DISEASE (ICD-414.00) - on PLAVIX 75mg /d, allergic to ASA... followed by [facility] and seen 6/15 (note reviewed)... 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 ~  f/u Cardiac CT 2/11 showed>  Calcified plaque in the left main with < 50% stenosis.  Calcified plaque in the proximal and mid LAD with at most moderate (around 50%) stenosis in the mid LAD. ~  Vasc Screen per Cards 10/11 showed mild carotid plaques, norm AbdAo & ABIs...  HYPERLIPIDEMIA (ICD-272.4) - on LIPITOR 40mg /d, &  CoQ10... Last FLP 11/15 on Lip40 showed TChol 153, TG 42, HDL 53, LDL 92  DIABETES MELLITUS, BORDERLINE (ICD-790.29) - on diet alone... Labs 11/15- weight= 224#, BS=105, A1c=6.1  GERD (ICD-530.81) - he uses H2 blockers as needed.  DIVERTICULOSIS OF COLON (ICD-562.10) & COLONIC POLYPS (ICD-211.3) - his last polyps were ~23mm and removed in 2004= adenomatous... there is a +fam hx of colon cancer in his father who died at age 48... ~  Last colonoscopy 5/14 by DrKaplan (done w/ pt on Plavix rx)> mild divertics & 64mm polyp in desc colon removed (tubular adenoma) & repeat rec in 5 yrs.   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 ~  3/15: he is off the prev Testos replacement Rx> states he feels well, good energy, PSA=0.39  DEGENERATIVE JOINT DISEASE (ICD-715.90) - he reports doing fair- mostly c/o knees & hands ("I messed up my knee in a fall")... he saw Ortho ?who? on Glucosamine/ Chondroitin supplements.  LOW BACK PAIN SYNDROME (ICD-724.2)  MEMORY LOSS >>  ~  1/13: he states that his "memory  is fuzzy" & "Clarity is missing"; occas he will swear that he said something but he really didn't; he's had prev scans etc & I have recommended a Neurology eval for his memory but he declines at this time "I'll think about it" he said...  ANXIETY (ICD-300.00) - on WELLBUTRIN 150mg Bid, & ALPRAZOLAM 0.5mg  Prn... increased stress w/ mother's stroke last year... he's the main care giver w/ 2 sisters who Larry Stone't help much...   Health Maintenance: ~  Immuniztions:  he is encouraged to get the yearly Flu vaccine... given PNEUMOVAX 2/12 & PREVNAR-13 given 11/15... He reports a lot of travel & he's been to the travel clinic & had "every vaccine known to man"> TDAP 7/14 and Zoster 5/14...   Past Surgical History  Procedure Laterality Date  . Uvulopalatopharyngoplasty      surgery for OSA  . Cardiac catheterization      Outpatient Encounter Prescriptions as of 10/15/2015   Medication Sig  . albuterol (PROVENTIL) (2.5 MG/3ML) 0.083% nebulizer solution Take 3 mLs (2.5 mg total) by nebulization every 4 (four) hours as needed for wheezing or shortness of breath.  . ALPRAZolam (XANAX) 0.5 MG tablet TAKE 1 TABLET BY MOUTH 3 TIMES A DAY AS NEEDED  . amLODipine (NORVASC) 5 MG tablet Take 1 tablet (5 mg total) by mouth daily.  Marland Kitchen ammonium lactate (LAC-HYDRIN) 12 % lotion Apply topically as directed.  Marland Kitchen atorvastatin (LIPITOR) 40 MG tablet Take 1 tablet (40 mg total) by mouth daily.  Marland Kitchen b complex vitamins tablet Take 1 tablet by mouth daily.  . budesonide-formoterol (SYMBICORT) 160-4.5 MCG/ACT inhaler INHALE 2 PUFFS INTO THE LUNGS 2 (TWO) TIMES DAILY.  Marland Kitchen buPROPion (WELLBUTRIN SR) 150 MG 12 hr tablet Take 1 tablet (150 mg total) by mouth 2 (two) times daily.  . carvedilol (COREG) 12.5 MG tablet TAKE 1 TABLET BY MOUTH TWICE A DAY WITH A MEAL  . cetirizine (ZYRTEC) 10 MG tablet Take 1 tablet (10 mg total) by mouth daily.  . Cholecalciferol (VITAMIN D3) 5000 UNITS CAPS Take one tablet by mouth daily  . clopidogrel (PLAVIX) 75 MG tablet Take 1 tablet (75 mg total) by mouth daily.  . clotrimazole-betamethasone (LOTRISONE) cream APPLY AS DIRECTED  . Coenzyme Q10 (COQ10) 100 MG CAPS Take as directed  . cyanocobalamin 2000 MCG tablet Take 1 tablet (2,000 mcg total) by mouth daily.  . furosemide (LASIX) 40 MG tablet Take 1 tablet (40 mg total) by mouth daily as needed.  Marland Kitchen HYDROcodone-homatropine (HYCODAN) 5-1.5 MG/5ML syrup Take 5 mLs by mouth every 6 (six) hours as needed for cough.  . montelukast (SINGULAIR) 10 MG tablet Take 1 tablet (10 mg total) by mouth at bedtime.  . nitroGLYCERIN (NITROSTAT) 0.4 MG SL tablet Place 1 tablet (0.4 mg total) under the tongue every 5 (five) minutes as needed. For up to 3 doses.  . pantoprazole (PROTONIX) 40 MG tablet Take 1 tablet (40 mg total) by mouth daily. About 30 minutes before dinner time  . predniSONE (DELTASONE) 10 MG tablet Alternate 1  tablet and 1/2 tablet every other day. (Patient taking differently: Take 1/2 tablet every other day x 7 days then stop.)  . ranitidine (ZANTAC) 300 MG capsule Take 1 capsule (300 mg total) by mouth every evening.  . sildenafil (VIAGRA) 100 MG tablet TAKE 1 TABLET BY MOUTH AS NEEDED FOR ERECTILE DYSFUNCTION.  Marland Kitchen Umeclidinium Bromide (INCRUSE ELLIPTA) 62.5 MCG/INH AEPB Inhale 1 puff into the lungs daily.   Facility-Administered Encounter Medications as of 10/15/2015  Medication  .  triamcinolone acetonide (KENALOG) 10 MG/ML injection 10 mg    Allergies  Allergen Reactions  . Ace Inhibitors Swelling  . Angiotensin Receptor Blockers Swelling  . Aspirin Hives  . Codeine Nausea And Vomiting  . Irbesartan Other (See Comments)    REACTION: allergic to ARBs w/ angioedema  . Ramipril Other (See Comments)    REACTION: Allergic to ACE's w/ angioedema...    Current Medications, Allergies, Past Medical History, Past Surgical History, Family History, and Social History were reviewed in [employer] record.    Review of Systems       See HPI - all other systems neg except as noted...       The patient complains of dyspnea on exertion and muscle weakness.  The patient denies anorexia, fever, weight loss, weight gain, vision loss, decreased hearing, hoarseness, chest pain, syncope, peripheral edema, prolonged cough, headaches, hemoptysis, abdominal pain, melena, hematochezia, severe indigestion/heartburn, hematuria, incontinence, suspicious skin lesions, transient blindness, difficulty walking, depression, unusual weight change, abnormal bleeding, enlarged lymph nodes, and angioedema.     Objective:   Physical Exam    WD, sl overweight, 65 y/o WM in NAD... GENERAL:  Alert & oriented; pleasant & cooperative... HEENT:  [place]/AT, EOM-wnl, PERRLA, EACs-clear, TMs-wnl, NOSE-clear, THROAT-s/p UPPP surg... NECK:  Supple w/ fairROM; no JVD; normal carotid impulses w/o bruits; no  thyromegaly or nodules palpated; no lymphadenopathy. CHEST:  Clear to P & A; without wheezes/ rales/ or rhonchi. HEART:  Regular Rhythm; without murmurs/ rubs/ or gallops. ABDOMEN:  Soft & nontender; normal bowel sounds; no organomegaly or masses detected. EXT: without deformities, mild arthritic changes; no varicose veins/ venous insuffic/ or edema. Neuro:  intact w/o focal abn detected... DERM:  No lesions noted; no rash etc...  RADIOLOGY DATA:  Reviewed in the EPIC EMR & discussed w/ the patient...   LABORATORY DATA:  Reviewed in the EPIC EMR & discussed w/ the patient...     Assessment & Plan:    Hx OSA, s/p UPPP in 2004>  Prev noted recurrent snoring & using breathe right nasal strips; he didn't want repeat sleep study or further eval, just want Rx for the strips so he can pay for them w/ his flex acct...   Hx recurrent bronchitis, underlying COPD (mixed type), COPD exac>   11/15> He quit smoking in 2004 & c/o recurrent bronchitic exacerbations=> PFT shows mod obstructive dis & we tried DULERA but too $$$ therefore switch to SYMBICORT160-2spBid regularly w/ f/u PFTs later... 6/16> Xolair was denied by insurance despite his IgE level= 1400... 12/16> presented w/ refractory COPD exac; treated w/ ZPak, Medroltaper, Incruse, Mucinex, fluids, Hycodan => eventually improved...  3/17> he was improved and backed off on mult meds on his own accord... 4/17> recurrent AB symptoms prompted restart of Pred therapy, vigorous antireflux regimen, plus DUONEB Tid, Symbicort & Incruse 5/17> weaning Pred to 10-5 Qod schedule, continue other Rx, refer to DrKozlow for his input... 6/17> DrKozlow has made several adjustments & trying to get Xolair approved...  Medical issues per DrPlotnikov>  HBP, CAD s/pMI, VI/ edema, HL, IFG, GERD/ Divertics/ Polyps, GU/ Low-T, DJD/ LBP, Anxiety...    Patient's Medications  New Prescriptions   No medications on file  Previous Medications   ALBUTEROL (PROVENTIL)  (2.5 MG/3ML) 0.083% NEBULIZER SOLUTION    Take 3 mLs (2.5 mg total) by nebulization every 4 (four) hours as needed for wheezing or shortness of breath.   ALPRAZOLAM (XANAX) 0.5 MG TABLET    TAKE 1 TABLET BY  MOUTH 3 TIMES A DAY AS NEEDED   AMLODIPINE (NORVASC) 5 MG TABLET    Take 1 tablet (5 mg total) by mouth daily.   AMMONIUM LACTATE (LAC-HYDRIN) 12 % LOTION    Apply topically as directed.   ATORVASTATIN (LIPITOR) 40 MG TABLET    Take 1 tablet (40 mg total) by mouth daily.   B COMPLEX VITAMINS TABLET    Take 1 tablet by mouth daily.   BUDESONIDE-FORMOTEROL (SYMBICORT) 160-4.5 MCG/ACT INHALER    INHALE 2 PUFFS INTO THE LUNGS 2 (TWO) TIMES DAILY.   BUPROPION (WELLBUTRIN SR) 150 MG 12 HR TABLET    Take 1 tablet (150 mg total) by mouth 2 (two) times daily.   CARVEDILOL (COREG) 12.5 MG TABLET    TAKE 1 TABLET BY MOUTH TWICE A DAY WITH A MEAL   CETIRIZINE (ZYRTEC) 10 MG TABLET    Take 1 tablet (10 mg total) by mouth daily.   CHOLECALCIFEROL (VITAMIN D3) 5000 UNITS CAPS    Take one tablet by mouth daily   CLOPIDOGREL (PLAVIX) 75 MG TABLET    Take 1 tablet (75 mg total) by mouth daily.   CLOTRIMAZOLE-BETAMETHASONE (LOTRISONE) CREAM    APPLY AS DIRECTED   COENZYME Q10 (COQ10) 100 MG CAPS    Take as directed   CYANOCOBALAMIN 2000 MCG TABLET    Take 1 tablet (2,000 mcg total) by mouth daily.   FUROSEMIDE (LASIX) 40 MG TABLET    Take 1 tablet (40 mg total) by mouth daily as needed.   HYDROCODONE-HOMATROPINE (HYCODAN) 5-1.5 MG/5ML SYRUP    Take 5 mLs by mouth every 6 (six) hours as needed for cough.   MONTELUKAST (SINGULAIR) 10 MG TABLET    Take 1 tablet (10 mg total) by mouth at bedtime.   NITROGLYCERIN (NITROSTAT) 0.4 MG SL TABLET    Place 1 tablet (0.4 mg total) under the tongue every 5 (five) minutes as needed. For up to 3 doses.   PANTOPRAZOLE (PROTONIX) 40 MG TABLET    Take 1 tablet (40 mg total) by mouth daily. About 30 minutes before dinner time   PREDNISONE (DELTASONE) 10 MG TABLET    Alternate 1  tablet and 1/2 tablet every other day.   RANITIDINE (ZANTAC) 300 MG CAPSULE    Take 1 capsule (300 mg total) by mouth every evening.   SILDENAFIL (VIAGRA) 100 MG TABLET    TAKE 1 TABLET BY MOUTH AS NEEDED FOR ERECTILE DYSFUNCTION.   UMECLIDINIUM BROMIDE (INCRUSE ELLIPTA) 62.5 MCG/INH AEPB    Inhale 1 puff into the lungs daily.  Modified Medications   No medications on file  Discontinued Medications   No medications on file

## 2015-10-15 NOTE — Patient Instructions (Signed)
Today we updated your med list in our EPIC system...    Continue your current medications the same...  We discussed your current med regimen and DrKozlow's changes...    Hopefully your will be approved for the [facility] : [facility] shots...  Call for any questions...  Let's plan a follow up visit in 15mo, sooner if needed for any problems.Marland KitchenMarland Kitchen

## 2015-11-05 ENCOUNTER — Ambulatory Visit: Payer: Medicare Other | Admitting: Allergy and Immunology

## 2015-11-09 ENCOUNTER — Other Ambulatory Visit: Payer: Self-pay | Admitting: Cardiovascular Disease

## 2015-11-11 ENCOUNTER — Telehealth: Payer: Self-pay | Admitting: Pediatrics

## 2015-11-11 NOTE — Telephone Encounter (Signed)
Patient stated don't waist your time searching, he said he did find where he attended the appointment and that he does owe the money. 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

## 2015-11-15 ENCOUNTER — Other Ambulatory Visit: Payer: Self-pay | Admitting: Pulmonary Disease

## 2015-12-05 ENCOUNTER — Telehealth: Payer: Self-pay | Admitting: Internal Medicine

## 2015-12-05 NOTE — Telephone Encounter (Signed)
Notified patient.

## 2015-12-05 NOTE — Telephone Encounter (Signed)
Patient will need an appt with PCP before a screening for HEP C will be ordered.

## 2015-12-05 NOTE — Telephone Encounter (Signed)
Patient is requesting Hep C screening.  Please advise.

## 2015-12-09 ENCOUNTER — Other Ambulatory Visit: Payer: Self-pay | Admitting: Internal Medicine

## 2015-12-10 NOTE — Telephone Encounter (Signed)
Called refill into pharmacy spoke w/Christy gave md approval.../lmb

## 2015-12-11 ENCOUNTER — Ambulatory Visit: Payer: Medicare Other | Admitting: Gastroenterology

## 2015-12-11 ENCOUNTER — Ambulatory Visit: Payer: Medicare Other

## 2015-12-11 ENCOUNTER — Telehealth: Payer: Self-pay | Admitting: Gastroenterology

## 2015-12-11 NOTE — Telephone Encounter (Signed)
Will not bill.

## 2015-12-16 ENCOUNTER — Ambulatory Visit (INDEPENDENT_AMBULATORY_CARE_PROVIDER_SITE_OTHER): Payer: Medicare Other

## 2015-12-16 DIAGNOSIS — Z23 Encounter for immunization: Secondary | ICD-10-CM | POA: Diagnosis not present

## 2015-12-30 ENCOUNTER — Encounter: Payer: Self-pay | Admitting: Allergy and Immunology

## 2015-12-30 ENCOUNTER — Ambulatory Visit (INDEPENDENT_AMBULATORY_CARE_PROVIDER_SITE_OTHER): Payer: Medicare Other | Admitting: Allergy and Immunology

## 2015-12-30 VITALS — BP 142/80 | HR 60 | Resp 16

## 2015-12-30 DIAGNOSIS — J45909 Unspecified asthma, uncomplicated: Secondary | ICD-10-CM | POA: Diagnosis not present

## 2015-12-30 DIAGNOSIS — J3089 Other allergic rhinitis: Secondary | ICD-10-CM

## 2015-12-30 DIAGNOSIS — J387 Other diseases of larynx: Secondary | ICD-10-CM

## 2015-12-30 DIAGNOSIS — K219 Gastro-esophageal reflux disease without esophagitis: Secondary | ICD-10-CM

## 2015-12-30 DIAGNOSIS — J449 Chronic obstructive pulmonary disease, unspecified: Secondary | ICD-10-CM

## 2015-12-30 MED ORDER — MONTELUKAST SODIUM 10 MG PO TABS: 10.0000 mg | ORAL_TABLET | Freq: Every day | ORAL | 0 refills | Status: DC

## 2015-12-30 NOTE — Patient Instructions (Signed)
  1. Continue to perform Allergen avoidance measures  2. Continue to Treat and prevent inflammation with the following:   A. Symbicort 160 2 inhalations twice a day with spacer  B. STOP INCRUSE  C. montelukast 10 mg one time per day  D. OTC Rhinocort one spray each nostril one time per day  3. Continue to Treat and prevent reflux with the following:   A. continue no caffeine or chocolate consumption  B. continue pantoprazole 40 mg in a.m.  C. STOP RANITIDINE  4. If needed:   A. Proventil HFA 2 puffs every 4-6 hours  B. Albuterol (not ipratropium) nebulization every 4-6 hours  5. Return to clinic in 3 months or earlier if problem  6. Obtain fall flu vaccine

## 2015-12-30 NOTE — Progress Notes (Signed)
Follow-up Note  Referring Provider: Cassandria Anger, MD Primary Provider: [name], MD Date of Office Visit: 12/30/2015  Subjective:   Larry Stone (DOB: May 11, 1950) is a 64 y.o. male who returns to the [facility] on 12/30/2015 in re-evaluation of the following:  HPI: Larry Stone returns to this clinic in reevaluation of his respiratory tract disease which includes a component of COPD with asthma, allergic rhinitis, and reflux-induced respiratory disease.. At this point he is completely asymptomatic regarding both his upper and lower airways and reflux. He does not need to use a short-acting bronchodilator. He's no longer on any prednisone. He questions why he has to use medications at all. He can't understand why he has to use medications for flareups of his respiratory tract that appear to occur 4 times per year and usually requires systemic steroids. He would like to know why his immune system has changed and what he can do to change it back to normal. He cannot follow through with Xolair administration based upon cost.    Medication List      albuterol (2.5 MG/3ML) 0.083% nebulizer solution Commonly known as:  PROVENTIL Take 3 mLs (2.5 mg total) by nebulization every 4 (four) hours as needed for wheezing or shortness of breath.   ALPRAZolam 0.5 MG tablet Commonly known as:  XANAX TAKE 1 TABLET BY MOUTH 3 TIMES A DAY AS NEEDED   amLODipine 5 MG tablet Commonly known as:  NORVASC TAKE 1 TABLET (5 MG TOTAL) BY MOUTH DAILY.   ammonium lactate 12 % lotion Commonly known as:  LAC-HYDRIN Apply topically as directed.   atorvastatin 40 MG tablet Commonly known as:  LIPITOR Take 1 tablet (40 mg total) by mouth daily.   b complex vitamins tablet Take 1 tablet by mouth daily.   budesonide-formoterol 160-4.5 MCG/ACT inhaler Commonly known as:  SYMBICORT INHALE 2 PUFFS INTO THE LUNGS 2 (TWO) TIMES DAILY.   buPROPion 150 MG 12 hr tablet Commonly known  as:  WELLBUTRIN SR Take 1 tablet (150 mg total) by mouth 2 (two) times daily.   carvedilol 12.5 MG tablet Commonly known as:  COREG TAKE 1 TABLET BY MOUTH TWICE A DAY WITH A MEAL   cetirizine 10 MG tablet Commonly known as:  ZYRTEC Take 1 tablet (10 mg total) by mouth daily.   clopidogrel 75 MG tablet Commonly known as:  PLAVIX Take 1 tablet (75 mg total) by mouth daily.   clotrimazole-betamethasone cream Commonly known as:  LOTRISONE APPLY AS DIRECTED   CoQ10 100 MG Caps Take as directed   cyanocobalamin 2000 MCG tablet Take 1 tablet (2,000 mcg total) by mouth daily.   furosemide 40 MG tablet Commonly known as:  LASIX Take 1 tablet (40 mg total) by mouth daily as needed.   HYDROcodone-homatropine 5-1.5 MG/5ML syrup Commonly known as:  HYCODAN Take 5 mLs by mouth every 6 (six) hours as needed for cough.   montelukast 10 MG tablet Commonly known as:  SINGULAIR Take 1 tablet (10 mg total) by mouth at bedtime.   nitroGLYCERIN 0.4 MG SL tablet Commonly known as:  NITROSTAT Place 1 tablet (0.4 mg total) under the tongue every 5 (five) minutes as needed. For up to 3 doses.   pantoprazole 40 MG tablet Commonly known as:  PROTONIX TAKE 1 TABLET BY MOUTH DAILY. 30 MINUTES BEFORE DINNER TIME   ranitidine 300 MG capsule Commonly known as:  ZANTAC Take 1 capsule (300 mg total) by mouth every evening.  sildenafil 100 MG tablet Commonly known as:  VIAGRA TAKE 1 TABLET BY MOUTH AS NEEDED FOR ERECTILE DYSFUNCTION.   umeclidinium bromide 62.5 MCG/INH Aepb Commonly known as:  INCRUSE ELLIPTA Inhale 1 puff into the lungs daily.   Vitamin D3 5000 units Caps Take one tablet by mouth daily       Past Medical History:  Diagnosis Date  . Anxiety   . CAD    --8/09: ETT normal --acute inferior lateral wall infarction in September 2004 treated medically.  --cardiac CT 2006  residual 50% left main stenosis and nonobstructive disease elsewhere. EF normal   --cardiac CT 05/2009.  LM. <50% LAD. 50%  . Diabetes mellitus   . Diverticulosis   . DJD (degenerative joint disease)   . GERD (gastroesophageal reflux disease)   . HTN (hypertension)   . Hyperlipidemia   . Low back pain syndrome   . Myocardial infarction ([place]) 2004  . OSA (obstructive sleep apnea)   . Pharyngitis   . Recurrent aspiration bronchitis/pneumonia ([place])   . Tubular adenoma of colon 2014  . URI (upper respiratory infection)     Past Surgical History:  Procedure Laterality Date  . CARDIAC CATHETERIZATION    . UVULOPALATOPHARYNGOPLASTY     surgery for OSA    Allergies  Allergen Reactions  . Ace Inhibitors Swelling  . Angiotensin Receptor Blockers Swelling  . Aspirin Hives  . Codeine Nausea And Vomiting  . Irbesartan Other (See Comments)    REACTION: allergic to ARBs w/ angioedema  . Ramipril Other (See Comments)    REACTION: Allergic to ACE's w/ angioedema...    Review of systems negative except as noted in HPI / PMHx or noted below:  Review of Systems  Constitutional: Negative.   HENT: Negative.   Eyes: Negative.   Respiratory: Negative.   Cardiovascular: Negative.   Gastrointestinal: Negative.   Genitourinary: Negative.   Musculoskeletal: Negative.   Skin: Negative.   Neurological: Negative.   Endo/Heme/Allergies: Negative.   Psychiatric/Behavioral: Negative.      Objective:   Vitals:   12/30/15 1136  BP: (!) 142/80  Pulse: 60  Resp: 16          Physical Exam  Constitutional: He is well-developed, well-nourished, and in no distress.  HENT:  Head: Normocephalic.  Right Ear: Tympanic membrane, external ear and ear canal normal.  Left Ear: Tympanic membrane, external ear and ear canal normal.  Nose: Nose normal. No mucosal edema or rhinorrhea.  Mouth/Throat: Uvula is midline, oropharynx is clear and moist and mucous membranes are normal. No oropharyngeal exudate.  Eyes: Conjunctivae are normal.  Neck: Trachea normal. No tracheal tenderness present. No  tracheal deviation present. No thyromegaly present.  Cardiovascular: Normal rate, regular rhythm, S1 normal, S2 normal and normal heart sounds.   No murmur heard. Pulmonary/Chest: Breath sounds normal. No stridor. No respiratory distress. He has no wheezes. He has no rales.  Musculoskeletal: He exhibits no edema.  Lymphadenopathy:       Head (right side): No tonsillar adenopathy present.       Head (left side): No tonsillar adenopathy present.    He has no cervical adenopathy.  Neurological: He is alert. Gait normal.  Skin: No rash noted. He is not diaphoretic. No erythema. Nails show no clubbing.  Psychiatric: Mood and affect normal.    Diagnostics:    Spirometry was performed and demonstrated an FEV1 of 2.06 at 60 % of predicted.  The patient had an Asthma Control Test with the following results:  .  Assessment and Plan:   1. COPD with asthma ([place])   2. LPRD (laryngopharyngeal reflux disease)   3. Other allergic rhinitis     1. Continue to perform Allergen avoidance measures  2. Continue to Treat and prevent inflammation with the following:   A. Symbicort 160 2 inhalations twice a day with spacer  B. STOP INCRUSE  C. montelukast 10 mg one time per day  D. OTC Rhinocort one spray each nostril one time per day  3. Continue to Treat and prevent reflux with the following:   A. continue no caffeine or chocolate consumption  B. continue pantoprazole 40 mg in a.m.  C. STOP RANITIDINE  4. If needed:   A. Proventil HFA 2 puffs every 4-6 hours  B. Albuterol (not ipratropium) nebulization every 4-6 hours  5. Return to clinic in 3 months or earlier if problem  6. Obtain fall flu vaccine  Larry Stone is obviously doing better but that improvement should be placed in the context of his rather significant steroid requirements over the course of the past several months. I had a long talk with him today about the need to use medications preventatively in the hope of preventing him  from having to use steroids 4 times per year usually at relatively high doses usually for prolonged periods of time. Unfortunately it does not look as though he'll be able to afford a biological agent based upon his insurance coverage and the fact that he does not qualify for co-pay assistance based upon his income. Today were going to try to make things a little bit more convenient for him by consolidating his medical therapy as noted above including the elimination of his anticholinergic agent and elimination of his H2 receptor blocker. I will see him back in this clinic in approximately 12 months or earlier if there is a problem.  Allena Katz, MD [place]

## 2016-01-08 ENCOUNTER — Encounter: Payer: Self-pay | Admitting: Internal Medicine

## 2016-01-13 ENCOUNTER — Other Ambulatory Visit: Payer: Self-pay | Admitting: Cardiovascular Disease

## 2016-02-10 ENCOUNTER — Encounter: Payer: Medicare Other | Admitting: Internal Medicine

## 2016-02-17 ENCOUNTER — Encounter: Payer: Self-pay | Admitting: Gastroenterology

## 2016-02-17 ENCOUNTER — Other Ambulatory Visit (INDEPENDENT_AMBULATORY_CARE_PROVIDER_SITE_OTHER): Payer: Medicare Other

## 2016-02-17 ENCOUNTER — Ambulatory Visit (INDEPENDENT_AMBULATORY_CARE_PROVIDER_SITE_OTHER): Payer: Medicare Other | Admitting: Gastroenterology

## 2016-02-17 VITALS — BP 130/74 | HR 72 | Ht 70.0 in | Wt 219.4 lb

## 2016-02-17 DIAGNOSIS — Z800 Family history of malignant neoplasm of digestive organs: Secondary | ICD-10-CM | POA: Diagnosis not present

## 2016-02-17 DIAGNOSIS — R17 Unspecified jaundice: Secondary | ICD-10-CM

## 2016-02-17 DIAGNOSIS — Z860100 Personal history of colon polyps, unspecified: Secondary | ICD-10-CM

## 2016-02-17 LAB — HEPATIC FUNCTION PANEL
ALT: 17 U/L (ref 0–53)
AST: 15 U/L (ref 0–37)
Albumin: 4.3 g/dL (ref 3.5–5.2)
Alkaline Phosphatase: 105 U/L (ref 39–117)
BILIRUBIN DIRECT: 0.1 mg/dL (ref 0.0–0.3)
BILIRUBIN TOTAL: 0.6 mg/dL (ref 0.2–1.2)
Total Protein: 6.9 g/dL (ref 6.0–8.3)

## 2016-02-17 NOTE — Progress Notes (Signed)
HPI :  65 y/o male with a history of DM, HTN, HLD, OSA, CAD on plavix, here for reassessment to discussed colon cancer screening.   Father died of colon cancer as well as his Uncle. Father was age 55 when he was diagnosed. Barbaraann Rondo was also age 70 at time of diagnosis. Patient thinks cancers diagnosed on their first colonoscopy exams. He does not have any blood in the stools. No constipation or diarrhea. No abdominal pains bothering him. No weight loss. No history of anemia.   He has a history of CAD and is on plavix. He had an MI in early 2000s, he thinks around 2004. He is allergic to aspirin. No problems with chest pains or shortness of breath. HE reports he has been on a "3 year plan" for surveillance colonoscopies in the past, which reviewed the details of as outlined below, and wants to make sure his surveillance is up to date.  Of note, he had an elevated bilirubin to 1.8 with normal AST/ALT on labs in April. He denies any history of liver disease.   Colonoscopy 08/29/2012 - 92mm descending adenoma, diverticulosis Colonoscopy 01/2006 - diverticulosis, no polyps Colonoscopy 09/2002 - few polyps removed - 1 dimunutive cecal adenoma, left sided hyperplastic polyps Colonoscopy 08/1999 - few small colon polyps - adenomas and hyperplastic  Past Medical History:  Diagnosis Date  . Anxiety   . CAD    --8/09: ETT normal --acute inferior lateral wall infarction in September 2004 treated medically.  --cardiac CT 2006  residual 50% left main stenosis and nonobstructive disease elsewhere. EF normal   --cardiac CT 05/2009. LM. <50% LAD. 50%  . Diabetes mellitus   . Diverticulosis   . DJD (degenerative joint disease)   . GERD (gastroesophageal reflux disease)   . HTN (hypertension)   . Hyperlipidemia   . Low back pain syndrome   . Myocardial infarction 2004  . OSA (obstructive sleep apnea)   . Pharyngitis   . Recurrent aspiration bronchitis/pneumonia ([place])   . Tubular adenoma of colon 2014  . URI  (upper respiratory infection)      Past Surgical History:  Procedure Laterality Date  . CARDIAC CATHETERIZATION    . UVULOPALATOPHARYNGOPLASTY     surgery for OSA   Family History  Problem Relation Age of Onset  . Colon cancer Father   . Stroke Mother   . Heart disease Mother   . Hypertension Sister   . Diabetes Sister   . Colon cancer Paternal Uncle    Social History  Substance Use Topics  . Smoking status: Former Smoker    Packs/day: 1.00    Years: 20.00    Types: Cigarettes, Cigars    Quit date: 05/08/1998  . Smokeless tobacco: Never Used     Comment: quit in 2005  . Alcohol use 0.6 oz/week    1 Standard drinks or equivalent per week     Comment: social drinker   Current Outpatient Prescriptions  Medication Sig Dispense Refill  . albuterol (PROVENTIL) (2.5 MG/3ML) 0.083% nebulizer solution Take 3 mLs (2.5 mg total) by nebulization every 4 (four) hours as needed for wheezing or shortness of breath. 75 mL 1  . ALPRAZolam (XANAX) 0.5 MG tablet TAKE 1 TABLET BY MOUTH 3 TIMES A DAY AS NEEDED 90 tablet 1  . amLODipine (NORVASC) 5 MG tablet TAKE 1 TABLET (5 MG TOTAL) BY MOUTH DAILY. 90 tablet 1  . ammonium lactate (LAC-HYDRIN) 12 % lotion Apply topically as directed. 400 g 3  .  atorvastatin (LIPITOR) 40 MG tablet TAKE 1 TABLET (40 MG TOTAL) BY MOUTH DAILY. 90 tablet 0  . b complex vitamins tablet Take 1 tablet by mouth daily.    . budesonide-formoterol (SYMBICORT) 160-4.5 MCG/ACT inhaler INHALE 2 PUFFS INTO THE LUNGS 2 (TWO) TIMES DAILY. 3 Inhaler 3  . buPROPion (WELLBUTRIN SR) 150 MG 12 hr tablet Take 1 tablet (150 mg total) by mouth 2 (two) times daily. 180 tablet 1  . carvedilol (COREG) 12.5 MG tablet TAKE 1 TABLET BY MOUTH TWICE A DAY WITH A MEAL 180 tablet 0  . cetirizine (ZYRTEC) 10 MG tablet Take 1 tablet (10 mg total) by mouth daily. 30 tablet 11  . Cholecalciferol (VITAMIN D3) 5000 UNITS CAPS Take one tablet by mouth daily    . clopidogrel (PLAVIX) 75 MG tablet Take 1  tablet (75 mg total) by mouth daily. 90 tablet 1  . clotrimazole-betamethasone (LOTRISONE) cream APPLY AS DIRECTED 45 g 11  . Coenzyme Q10 (COQ10) 100 MG CAPS Take as directed 30 each 11  . cyanocobalamin 2000 MCG tablet Take 1 tablet (2,000 mcg total) by mouth daily. 30 tablet 11  . furosemide (LASIX) 40 MG tablet Take 1 tablet (40 mg total) by mouth daily as needed. 90 tablet 3  . HYDROcodone-homatropine (HYCODAN) 5-1.5 MG/5ML syrup Take 5 mLs by mouth every 6 (six) hours as needed for cough. 240 mL 0  . montelukast (SINGULAIR) 10 MG tablet Take 1 tablet (10 mg total) by mouth at bedtime. 90 tablet 0  . nitroGLYCERIN (NITROSTAT) 0.4 MG SL tablet Place 1 tablet (0.4 mg total) under the tongue every 5 (five) minutes as needed. For up to 3 doses. 25 tablet 6  . pantoprazole (PROTONIX) 40 MG tablet TAKE 1 TABLET BY MOUTH DAILY. 30 MINUTES BEFORE DINNER TIME 90 tablet 1  . sildenafil (VIAGRA) 100 MG tablet TAKE 1 TABLET BY MOUTH AS NEEDED FOR ERECTILE DYSFUNCTION. 15 tablet 0   Current Facility-Administered Medications  Medication Dose Route Frequency Provider Last Rate Last Dose  . triamcinolone acetonide (KENALOG) 10 MG/ML injection 10 mg  10 mg Other Once Wallene Huh, DPM       Allergies  Allergen Reactions  . Ace Inhibitors Swelling  . Angiotensin Receptor Blockers Swelling  . Aspirin Hives  . Codeine Nausea And Vomiting  . Irbesartan Other (See Comments)    REACTION: allergic to ARBs w/ angioedema  . Ramipril Other (See Comments)    REACTION: Allergic to ACE's w/ angioedema...     Review of Systems: All systems reviewed and negative except where noted in HPI.   Lab Results  Component Value Date   WBC 15.7 (H) 08/12/2015   HGB 14.9 08/12/2015   HCT 43.8 08/12/2015   MCV 94.8 08/12/2015   PLT 165.0 08/12/2015   Lab Results  Component Value Date   CREATININE 1.20 08/12/2015   BUN 20 08/12/2015   NA 134 (L) 08/12/2015   K 4.2 08/12/2015   CL 97 08/12/2015   CO2 28  08/12/2015     Physical Exam: BP 130/74 (BP Location: Left Arm, Patient Position: Sitting, Cuff Size: Normal)   Pulse 72   Ht 5\' 10"  (1.778 m)   Wt 219 lb 6.4 oz (99.5 kg)   BMI 31.48 kg/m  Constitutional: Pleasant,well-developed, male in no acute distress. HEENT: Normocephalic and atraumatic. Conjunctivae are normal. No scleral icterus. Neck supple.  Cardiovascular: Normal rate, regular rhythm.  Pulmonary/chest: Effort normal and breath sounds normal. No wheezing, rales or rhonchi. Abdominal:  Soft, nondistended, nontender. There are no masses palpable.  Extremities: no edema Lymphadenopathy: No cervical adenopathy noted. Neurological: Alert and oriented to person place and time. Skin: Skin is warm and dry. No rashes noted. Psychiatric: Normal mood and affect. Behavior is normal.   ASSESSMENT AND PLAN: 65 y/o male with history as above, he has not been seen in > 3 years, here for reassessment of the following issues:  History of colon adenomas / FH of colon cancer - I reviewed surveillance guidelines with him. He had a single small adenoma removed 3 years ago, based on this result is next due for his surveillance colonoscopy in May 2019 in the absence of new symptoms or anemia. His family history would otherwise not warrant an exam sooner than that time frame. We discussed that a colonoscopy would entail holding his plavix, and while being allergic to aspirin, could have small increased risk of cardiovascular event while holding plavix. We discussed these issues for a bit, and I offered him a sooner colonoscopy for piece of mind if he was really anxious about this issue, but reassured him and he was comfortable with waiting until 2019 per surveillance guidelines. If he has any symptoms in the interim he should contact us otherwise.   One time elevation of bilirubin - asymptomatic, will repeat with direct component to see if this persists  [place] Cellar, MD [facility]  Gastroenterology Pager [phone]

## 2016-02-17 NOTE — Patient Instructions (Signed)
If you are age 65 or older, your body mass index should be between 23-30. Your Body mass index is 31.48 kg/m. If this is out of the aforementioned range listed, please consider follow up with your Primary Care Provider.  If you are age 77 or younger, your body mass index should be between 19-25. Your Body mass index is 31.48 kg/m. If this is out of the aformentioned range listed, please consider follow up with your Primary Care Provider.   Your physician has requested that you go to the basement for the following lab work before leaving today: Bilirubin, LFT'S.  A recall has been placed for your colonoscopy which is due May of 2019.

## 2016-02-18 ENCOUNTER — Encounter: Payer: Self-pay | Admitting: Gastroenterology

## 2016-02-29 ENCOUNTER — Other Ambulatory Visit: Payer: Self-pay | Admitting: Internal Medicine

## 2016-03-03 ENCOUNTER — Other Ambulatory Visit: Payer: Self-pay | Admitting: Internal Medicine

## 2016-03-03 NOTE — Telephone Encounter (Signed)
Called pt to inform him the reason why refill was denied. Per chart overdue for CPX last appt 08/2014. Pt has made CPX for 12/12, and wanting to get a refill until then. Pls advise...Johny Chess

## 2016-03-03 NOTE — Telephone Encounter (Signed)
Pt call left msg on triage stating [facility] called and said refill was denied. Wanting to know reason why...Larry Stone

## 2016-03-07 ENCOUNTER — Other Ambulatory Visit: Payer: Self-pay | Admitting: Internal Medicine

## 2016-03-08 ENCOUNTER — Telehealth: Payer: Self-pay | Admitting: Internal Medicine

## 2016-03-08 NOTE — Telephone Encounter (Signed)
patient is calling to follow up on the request to get a temporary refill for xanax until his cpx. Please advise.

## 2016-03-08 NOTE — Progress Notes (Signed)
Letter mailed

## 2016-03-08 NOTE — Telephone Encounter (Signed)
error 

## 2016-03-10 ENCOUNTER — Encounter: Payer: Self-pay | Admitting: Internal Medicine

## 2016-03-10 NOTE — Telephone Encounter (Signed)
Done

## 2016-03-21 ENCOUNTER — Other Ambulatory Visit: Payer: Self-pay | Admitting: Allergy and Immunology

## 2016-03-21 ENCOUNTER — Other Ambulatory Visit: Payer: Self-pay | Admitting: Cardiovascular Disease

## 2016-03-30 ENCOUNTER — Encounter: Payer: Self-pay | Admitting: Internal Medicine

## 2016-03-30 ENCOUNTER — Other Ambulatory Visit (INDEPENDENT_AMBULATORY_CARE_PROVIDER_SITE_OTHER): Payer: Medicare Other

## 2016-03-30 ENCOUNTER — Ambulatory Visit (INDEPENDENT_AMBULATORY_CARE_PROVIDER_SITE_OTHER): Payer: Medicare Other | Admitting: Internal Medicine

## 2016-03-30 VITALS — BP 118/64 | HR 66 | Ht 70.0 in | Wt 219.0 lb

## 2016-03-30 DIAGNOSIS — Z0000 Encounter for general adult medical examination without abnormal findings: Secondary | ICD-10-CM

## 2016-03-30 DIAGNOSIS — R202 Paresthesia of skin: Secondary | ICD-10-CM

## 2016-03-30 DIAGNOSIS — E785 Hyperlipidemia, unspecified: Secondary | ICD-10-CM

## 2016-03-30 DIAGNOSIS — I2510 Atherosclerotic heart disease of native coronary artery without angina pectoris: Secondary | ICD-10-CM

## 2016-03-30 DIAGNOSIS — F411 Generalized anxiety disorder: Secondary | ICD-10-CM | POA: Diagnosis not present

## 2016-03-30 DIAGNOSIS — I10 Essential (primary) hypertension: Secondary | ICD-10-CM

## 2016-03-30 DIAGNOSIS — N320 Bladder-neck obstruction: Secondary | ICD-10-CM | POA: Diagnosis not present

## 2016-03-30 DIAGNOSIS — R600 Localized edema: Secondary | ICD-10-CM

## 2016-03-30 LAB — CBC WITH DIFFERENTIAL/PLATELET
BASOS ABS: 0 10*3/uL (ref 0.0–0.1)
Basophils Relative: 0.4 % (ref 0.0–3.0)
Eosinophils Absolute: 0.4 10*3/uL (ref 0.0–0.7)
Eosinophils Relative: 4 % (ref 0.0–5.0)
HCT: 42.4 % (ref 39.0–52.0)
HEMOGLOBIN: 14.7 g/dL (ref 13.0–17.0)
LYMPHS ABS: 1.7 10*3/uL (ref 0.7–4.0)
Lymphocytes Relative: 18.2 % (ref 12.0–46.0)
MCHC: 34.5 g/dL (ref 30.0–36.0)
MCV: 91.9 fl (ref 78.0–100.0)
MONO ABS: 0.8 10*3/uL (ref 0.1–1.0)
MONOS PCT: 9.1 % (ref 3.0–12.0)
NEUTROS PCT: 68.3 % (ref 43.0–77.0)
Neutro Abs: 6.3 10*3/uL (ref 1.4–7.7)
Platelets: 178 10*3/uL (ref 150.0–400.0)
RBC: 4.62 Mil/uL (ref 4.22–5.81)
RDW: 13.5 % (ref 11.5–15.5)
WBC: 9.3 10*3/uL (ref 4.0–10.5)

## 2016-03-30 LAB — VITAMIN B12: VITAMIN B 12: 743 pg/mL (ref 211–911)

## 2016-03-30 LAB — URINALYSIS
Bilirubin Urine: NEGATIVE
Hgb urine dipstick: NEGATIVE
KETONES UR: NEGATIVE
Leukocytes, UA: NEGATIVE
Nitrite: NEGATIVE
PH: 6 (ref 5.0–8.0)
SPECIFIC GRAVITY, URINE: 1.025 (ref 1.000–1.030)
TOTAL PROTEIN, URINE-UPE24: NEGATIVE
URINE GLUCOSE: NEGATIVE
Urobilinogen, UA: 0.2 (ref 0.0–1.0)

## 2016-03-30 LAB — TSH: TSH: 1.78 u[IU]/mL (ref 0.35–4.50)

## 2016-03-30 LAB — HEPATIC FUNCTION PANEL
ALK PHOS: 99 U/L (ref 39–117)
ALT: 18 U/L (ref 0–53)
AST: 16 U/L (ref 0–37)
Albumin: 4.1 g/dL (ref 3.5–5.2)
BILIRUBIN TOTAL: 1.1 mg/dL (ref 0.2–1.2)
Bilirubin, Direct: 0.2 mg/dL (ref 0.0–0.3)
Total Protein: 6.4 g/dL (ref 6.0–8.3)

## 2016-03-30 LAB — BASIC METABOLIC PANEL
BUN: 15 mg/dL (ref 6–23)
CALCIUM: 9.1 mg/dL (ref 8.4–10.5)
CO2: 31 mEq/L (ref 19–32)
Chloride: 104 mEq/L (ref 96–112)
Creatinine, Ser: 0.96 mg/dL (ref 0.40–1.50)
GFR: 83.33 mL/min (ref >60.00)
GLUCOSE: 111 mg/dL — AB (ref 70–99)
POTASSIUM: 4.3 meq/L (ref 3.5–5.1)
SODIUM: 141 meq/L (ref 135–145)

## 2016-03-30 LAB — LIPID PANEL
CHOL/HDL RATIO: 3
Cholesterol: 128 mg/dL (ref 0–200)
HDL: 44.3 mg/dL (ref >39.00)
LDL Cholesterol: 72 mg/dL (ref 0–99)
NONHDL: 83.7
Triglycerides: 59 mg/dL (ref 0.0–149.0)
VLDL: 11.8 mg/dL (ref 0.0–40.0)

## 2016-03-30 LAB — PSA: PSA: 0.43 ng/mL (ref 0.10–4.00)

## 2016-03-30 LAB — HEPATITIS C ANTIBODY: HCV Ab: NEGATIVE

## 2016-03-30 MED ORDER — ALPRAZOLAM 0.5 MG PO TABS: 0.5000 mg | ORAL_TABLET | Freq: Three times a day (TID) | ORAL | 3 refills | Status: DC | PRN

## 2016-03-30 NOTE — Assessment & Plan Note (Signed)
D/c Norvasc due to swelling

## 2016-03-30 NOTE — Progress Notes (Signed)
Pre visit review using our clinic review tool, if applicable. No additional management support is needed unless otherwise documented below in the visit note. 

## 2016-03-30 NOTE — Assessment & Plan Note (Signed)
Here for medicare wellness/physical  Diet: heart healthy  Physical activity: not sedentary  Depression/mood screen: negative  Hearing: intact to whispered voice  Visual acuity: grossly normal, performs annual eye exam  ADLs: capable  Fall risk: low to none  Home safety: good  Cognitive evaluation: intact to orientation, naming, recall and repetition  EOL planning: adv directives, full code/ I agree  I have personally reviewed and have noted  1. The patient's medical, surgical and social history  2. Their use of alcohol, tobacco or illicit drugs  3. Their current medications and supplements  4. The patient's functional ability including ADL's, fall risks, home safety risks and hearing or visual impairment.  5. Diet and physical activities  6. Evidence for depression or mood disorders 7. The roster of all physicians providing medical care to patient - is listed in the Snapshot section of the chart and reviewed today.    Today patient counseled on age appropriate routine health concerns for screening and prevention, each reviewed and up to date or declined. Immunizations reviewed and up to date or declined. Labs ordered and reviewed. Risk factors for depression reviewed and negative. Hearing function and visual acuity are intact. ADLs screened and addressed as needed. Functional ability and level of safety reviewed and appropriate. Education, counseling and referrals performed based on assessed risks today. Patient provided with a copy of personalized plan for preventive services.  Colon pending

## 2016-03-30 NOTE — Patient Instructions (Signed)

## 2016-03-30 NOTE — Progress Notes (Signed)
Subjective:  Patient ID: Larry Stone, male    DOB: Jul 20, 1950  Age: 65 y.o. MRN: [id number]  CC: No chief complaint on file.   HPI Larry Stone presents for a well exam C/o leg swelling - more frequent lately R>L C/o pain in hands and knuckles   Outpatient Medications Prior to Visit  Medication Sig Dispense Refill  . albuterol (PROVENTIL) (2.5 MG/3ML) 0.083% nebulizer solution Take 3 mLs (2.5 mg total) by nebulization every 4 (four) hours as needed for wheezing or shortness of breath. 75 mL 1  . ALPRAZolam (XANAX) 0.5 MG tablet TAKE 1 TABLET BY MOUTH 3 TIMES A DAY AS NEEDED 90 tablet 1  . amLODipine (NORVASC) 5 MG tablet TAKE 1 TABLET (5 MG TOTAL) BY MOUTH DAILY. 90 tablet 1  . ammonium lactate (LAC-HYDRIN) 12 % lotion Apply topically as directed. 400 g 3  . atorvastatin (LIPITOR) 40 MG tablet TAKE 1 TABLET (40 MG TOTAL) BY MOUTH DAILY. 90 tablet 0  . budesonide-formoterol (SYMBICORT) 160-4.5 MCG/ACT inhaler INHALE 2 PUFFS INTO THE LUNGS 2 (TWO) TIMES DAILY. 3 Inhaler 3  . buPROPion (WELLBUTRIN SR) 150 MG 12 hr tablet Take 1 tablet (150 mg total) by mouth 2 (two) times daily. 180 tablet 1  . carvedilol (COREG) 12.5 MG tablet TAKE 1 TABLET BY MOUTH TWICE A DAY WITH A MEAL 180 tablet 0  . cetirizine (ZYRTEC) 10 MG tablet Take 1 tablet (10 mg total) by mouth daily. 30 tablet 11  . Cholecalciferol (VITAMIN D3) 5000 UNITS CAPS Take one tablet by mouth daily    . clopidogrel (PLAVIX) 75 MG tablet Take 1 tablet (75 mg total) by mouth daily. 90 tablet 1  . clotrimazole-betamethasone (LOTRISONE) cream APPLY AS DIRECTED 45 g 11  . Coenzyme Q10 (COQ10) 100 MG CAPS Take as directed 30 each 11  . cyanocobalamin 2000 MCG tablet Take 1 tablet (2,000 mcg total) by mouth daily. 30 tablet 11  . furosemide (LASIX) 40 MG tablet TAKE 1 TABLET BY MOUTH EVERY DAY AS NEEDED 90 tablet 0  . HYDROcodone-homatropine (HYCODAN) 5-1.5 MG/5ML syrup Take 5 mLs by mouth every 6 (six) hours as needed for cough.  240 mL 0  . montelukast (SINGULAIR) 10 MG tablet TAKE 1 TABLET BY MOUTH AT BEDTIME 90 tablet 0  . nitroGLYCERIN (NITROSTAT) 0.4 MG SL tablet Place 1 tablet (0.4 mg total) under the tongue every 5 (five) minutes as needed. For up to 3 doses. 25 tablet 6  . pantoprazole (PROTONIX) 40 MG tablet TAKE 1 TABLET BY MOUTH DAILY. 30 MINUTES BEFORE DINNER TIME 90 tablet 1  . sildenafil (VIAGRA) 100 MG tablet TAKE 1 TABLET BY MOUTH AS NEEDED FOR ERECTILE DYSFUNCTION. 15 tablet 0  . b complex vitamins tablet Take 1 tablet by mouth daily.     Facility-Administered Medications Prior to Visit  Medication Dose Route Frequency Provider Last Rate Last Dose  . triamcinolone acetonide (KENALOG) 10 MG/ML injection 10 mg  10 mg Other Once Wallene Huh, DPM        ROS Review of Systems  Constitutional: Negative for appetite change, fatigue and unexpected weight change.  HENT: Negative for congestion, nosebleeds, sneezing, sore throat and trouble swallowing.   Eyes: Negative for itching and visual disturbance.  Respiratory: Negative for cough.   Cardiovascular: Positive for leg swelling. Negative for chest pain and palpitations.  Gastrointestinal: Negative for abdominal distention, blood in stool, diarrhea and nausea.  Genitourinary: Negative for frequency and hematuria.  Musculoskeletal: Negative for back  pain, gait problem, joint swelling and neck pain.  Skin: Negative for rash.  Neurological: Negative for dizziness, tremors, speech difficulty and weakness.  Psychiatric/Behavioral: Negative for agitation, dysphoric mood and sleep disturbance. The patient is not nervous/anxious.     Objective:  BP 118/64   Pulse 66   Ht 5\' 10"  (1.778 m)   Wt 219 lb (99.3 kg)   SpO2 95%   BMI 31.42 kg/m   BP Readings from Last 3 Encounters:  03/30/16 118/64  02/17/16 130/74  12/30/15 (!) 142/80    Wt Readings from Last 3 Encounters:  03/30/16 219 lb (99.3 kg)  02/17/16 219 lb 6.4 oz (99.5 kg)  10/15/15 219 lb  (99.3 kg)    Physical Exam  Constitutional: He is oriented to person, place, and time. He appears well-developed and well-nourished. No distress.  HENT:  Head: Normocephalic and atraumatic.  Right Ear: External ear normal.  Left Ear: External ear normal.  Nose: Nose normal.  Mouth/Throat: Oropharynx is clear and moist. No oropharyngeal exudate.  Eyes: Conjunctivae and EOM are normal. Pupils are equal, round, and reactive to light. Right eye exhibits no discharge. Left eye exhibits no discharge. No scleral icterus.  Neck: Normal range of motion. Neck supple. No JVD present. No tracheal deviation present. No thyromegaly present.  Cardiovascular: Normal rate, regular rhythm, normal heart sounds and intact distal pulses.  Exam reveals no gallop and no friction rub.   No murmur heard. Pulmonary/Chest: Effort normal and breath sounds normal. No stridor. No respiratory distress. He has no wheezes. He has no rales. He exhibits no tenderness.  Abdominal: Soft. Bowel sounds are normal. He exhibits no distension and no mass. There is no tenderness. There is no rebound and no guarding.  Genitourinary: Rectum normal, prostate normal and penis normal. Rectal exam shows guaiac negative stool. No penile tenderness.  Musculoskeletal: Normal range of motion. He exhibits no edema or tenderness.  Lymphadenopathy:    He has no cervical adenopathy.  Neurological: He is alert and oriented to person, place, and time. He has normal reflexes. No cranial nerve deficit. He exhibits normal muscle tone. Coordination normal.  Skin: Skin is warm and dry. No rash noted. He is not diaphoretic. No erythema. No pallor.  Psychiatric: He has a normal mood and affect. His behavior is normal. Judgment and thought content normal.  trace edema B  Lab Results  Component Value Date   WBC 15.7 (H) 08/12/2015   HGB 14.9 08/12/2015   HCT 43.8 08/12/2015   PLT 165.0 08/12/2015   GLUCOSE 121 (H) 08/12/2015   CHOL 130 10/15/2014    TRIG 53.0 10/15/2014   HDL 47.30 10/15/2014   LDLCALC 72 10/15/2014   ALT 17 02/17/2016   AST 15 02/17/2016   NA 134 (L) 08/12/2015   K 4.2 08/12/2015   CL 97 08/12/2015   CREATININE 1.20 08/12/2015   BUN 20 08/12/2015   CO2 28 08/12/2015   TSH 1.77 03/05/2014   PSA 0.50 03/05/2014   HGBA1C 5.7 10/15/2014    Dg Chest 2 View  Result Date: 08/12/2015 CLINICAL DATA:  Cough and congestion.  Low-grade fever . EXAM: CHEST  2 VIEW COMPARISON:  04/04/2015 . FINDINGS: Mediastinum hilar structures are normal. Mild right perihilar and right lower lobe infiltrate cannot be excluded. Heart size is normal. No pleural effusion or pneumothorax. Right lateral pleural thickening is again noted consistent scarring. Degenerative changes thoracic spine . IMPRESSION: Right perihilar and right base mild infiltrate cannot be excluded . Electronically Signed  By: [place]   On: 08/12/2015 12:04    Assessment & Plan:   There are no diagnoses linked to this encounter. I have discontinued Mr. Kissling b complex vitamins. I am also having him maintain his cyanocobalamin, cetirizine, CoQ10, Vitamin D3, ammonium lactate, clotrimazole-betamethasone, sildenafil, clopidogrel, buPROPion, budesonide-formoterol, nitroGLYCERIN, HYDROcodone-homatropine, albuterol, amLODipine, pantoprazole, carvedilol, atorvastatin, ALPRAZolam, montelukast, furosemide, and cyanocobalamin. We will continue to administer triamcinolone acetonide.  Meds ordered this encounter  Medications  . cyanocobalamin 1000 MCG tablet    Sig: Take 1,000 mcg by mouth daily.     Follow-up: No Follow-up on file.  [name], MD

## 2016-03-30 NOTE — Assessment & Plan Note (Signed)
Coreg, Lipitor, Plavix

## 2016-04-15 ENCOUNTER — Other Ambulatory Visit: Payer: Self-pay | Admitting: Cardiovascular Disease

## 2016-04-20 ENCOUNTER — Other Ambulatory Visit: Payer: Self-pay

## 2016-04-20 MED ORDER — ATORVASTATIN CALCIUM 40 MG PO TABS: 40.0000 mg | ORAL_TABLET | Freq: Every day | ORAL | 0 refills | Status: DC

## 2016-04-20 MED ORDER — CARVEDILOL 12.5 MG PO TABS: ORAL_TABLET | ORAL | 0 refills | Status: DC

## 2016-04-27 ENCOUNTER — Other Ambulatory Visit: Payer: Self-pay | Admitting: Cardiovascular Disease

## 2016-04-27 ENCOUNTER — Other Ambulatory Visit: Payer: Self-pay | Admitting: Pulmonary Disease

## 2016-04-29 ENCOUNTER — Other Ambulatory Visit: Payer: Self-pay | Admitting: *Deleted

## 2016-04-29 MED ORDER — ATORVASTATIN CALCIUM 40 MG PO TABS: 40.0000 mg | ORAL_TABLET | Freq: Every day | ORAL | 0 refills | Status: DC

## 2016-04-29 MED ORDER — CARVEDILOL 12.5 MG PO TABS: ORAL_TABLET | ORAL | 0 refills | Status: DC

## 2016-04-29 NOTE — Telephone Encounter (Signed)
Spoke with patient as this medication was removed from his med list by his pcp. Patient stated that his pcp asked that he stop taking this medication to reduce the amount of medications that he is on and also due to his BP being normal. Patient went home and conducted some research and discovered that this medication should not be stopped abruptly. He continued to take it and stated that he would discuss this with Dr Angelena Form at his office visit next month. He has enough medication to last until he comes in. He also indicated that his preference would be to continue taking the medication.

## 2016-04-29 NOTE — Telephone Encounter (Signed)
Follow up  Pharmacist voiced calling in regards to amlodipine and this medication is not listed in the pts chart.  Pharmacist voiced there was a fax sent over on the 9th and 10th of this month and have not heard back.  Please f/u with pharmacist/pt

## 2016-05-11 ENCOUNTER — Telehealth: Payer: Self-pay | Admitting: Allergy and Immunology

## 2016-05-11 NOTE — Telephone Encounter (Signed)
Talked to [facility] - he only owes 50.77

## 2016-05-11 NOTE — Telephone Encounter (Signed)
Please call patient back today at his home phone # listed on file regarding his bill due. Pt said his statement reflects $50.77 and we show a different balance. Thanks

## 2016-06-09 ENCOUNTER — Encounter: Payer: Self-pay | Admitting: *Deleted

## 2016-06-09 ENCOUNTER — Ambulatory Visit (INDEPENDENT_AMBULATORY_CARE_PROVIDER_SITE_OTHER): Payer: Medicare Other | Admitting: Cardiovascular Disease

## 2016-06-09 ENCOUNTER — Encounter: Payer: Self-pay | Admitting: Cardiovascular Disease

## 2016-06-09 VITALS — BP 140/80 | HR 72 | Ht 70.0 in | Wt 221.0 lb

## 2016-06-09 DIAGNOSIS — I10 Essential (primary) hypertension: Secondary | ICD-10-CM

## 2016-06-09 DIAGNOSIS — E7800 Pure hypercholesterolemia, unspecified: Secondary | ICD-10-CM | POA: Diagnosis not present

## 2016-06-09 DIAGNOSIS — I2510 Atherosclerotic heart disease of native coronary artery without angina pectoris: Secondary | ICD-10-CM

## 2016-06-09 NOTE — Progress Notes (Signed)
Chief Complaint  Patient presents with  . Coronary Artery Disease    NO CP.  Marland Kitchen Hyperlipidemia  . Edema    IN LEGS.  Marland Kitchen Shortness of Breath    WITH EXERTION     History of Present Illness: 66 yo male with history of CAD, HLD, HTN, OSA here today for cardiac follow up. He has been followed in the past by Dr. Haroldine Laws. 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 He also has residual 50% left main stenosis and nonobstructive disease elsewhere. Cardiac CT in 05/2009 with LM < 50% LAD 50% LCX dominant nonobs RCA small no critical lesions. Small lung nodule. F/u CT showed stable pulm nodule. Carotid u/s 11/11 with minimal carotid plaque. Exercise treadmill stress test 08/30/13 but did not achieve target heart rate. Lexiscan stress myoview 10/03/13 with inferolateral scar, no ischemia, LVEF=52%.   He is here today for follow up. He is not feeling well. He has had progression of dyspnea with minimal exertion without chest pain over the last 6 months. This is occurring every day. He also has mild LE edema. No recent fever or chills.   Primary Care Physician: [name], MD   Past Medical History:  Diagnosis Date  . Anxiety   . CAD    --8/09: ETT normal --acute inferior lateral wall infarction in September 2004 treated medically.  --cardiac CT 2006  residual 50% left main stenosis and nonobstructive disease elsewhere. EF normal   --cardiac CT 05/2009. LM. <50% LAD. 50%  . Diabetes mellitus   . Diverticulosis   . DJD (degenerative joint disease)   . GERD (gastroesophageal reflux disease)   . HTN (hypertension)   . Hyperlipidemia   . Low back pain syndrome   . Myocardial infarction 2004  . OSA (obstructive sleep apnea)   . Pharyngitis   . Recurrent aspiration bronchitis/pneumonia ([place])   . Tubular adenoma of colon 2014  . URI (upper respiratory infection)     Past Surgical History:  Procedure Laterality Date  . CARDIAC CATHETERIZATION    .  UVULOPALATOPHARYNGOPLASTY     surgery for OSA    Current Outpatient Prescriptions  Medication Sig Dispense Refill  . albuterol (PROVENTIL) (2.5 MG/3ML) 0.083% nebulizer solution Take 3 mLs (2.5 mg total) by nebulization every 4 (four) hours as needed for wheezing or shortness of breath. 75 mL 1  . ALPRAZolam (XANAX) 0.5 MG tablet Take 1 tablet (0.5 mg total) by mouth 3 (three) times daily as needed. 90 tablet 3  . amLODipine (NORVASC) 5 MG tablet TAKE 1 TABLET BY MOUTH EVERY DAY 90 tablet 1  . ammonium lactate (LAC-HYDRIN) 12 % lotion Apply topically as directed. 400 g 3  . atorvastatin (LIPITOR) 40 MG tablet Take 1 tablet (40 mg total) by mouth daily. 90 tablet 0  . budesonide-formoterol (SYMBICORT) 160-4.5 MCG/ACT inhaler INHALE 2 PUFFS INTO THE LUNGS 2 (TWO) TIMES DAILY. 3 Inhaler 3  . buPROPion (WELLBUTRIN SR) 150 MG 12 hr tablet Take 1 tablet (150 mg total) by mouth 2 (two) times daily. 180 tablet 1  . carvedilol (COREG) 12.5 MG tablet TAKE 1 TABLET BY MOUTH TWICE A DAY WITH A MEAL 180 tablet 0  . cetirizine (ZYRTEC) 10 MG tablet Take 1 tablet (10 mg total) by mouth daily. 30 tablet 11  . Cholecalciferol (VITAMIN D3) 5000 UNITS CAPS Take one tablet by mouth daily    . clopidogrel (PLAVIX) 75 MG tablet Take 1 tablet (75 mg total) by  mouth daily. 90 tablet 1  . clotrimazole-betamethasone (LOTRISONE) cream APPLY AS DIRECTED 45 g 11  . Coenzyme Q10 (COQ10) 100 MG CAPS Take as directed 30 each 11  . cyanocobalamin 1000 MCG tablet Take 1,000 mcg by mouth daily.    . furosemide (LASIX) 40 MG tablet TAKE 1 TABLET BY MOUTH EVERY DAY AS NEEDED 90 tablet 0  . montelukast (SINGULAIR) 10 MG tablet TAKE 1 TABLET BY MOUTH AT BEDTIME 90 tablet 0  . nitroGLYCERIN (NITROSTAT) 0.4 MG SL tablet Place 1 tablet (0.4 mg total) under the tongue every 5 (five) minutes as needed. For up to 3 doses. 25 tablet 6  . pantoprazole (PROTONIX) 40 MG tablet TAKE 1 TABLET BY MOUTH EVERY DAY 30 MINUTES BEFORE DINNERTIME 90  tablet 0  . sildenafil (VIAGRA) 100 MG tablet TAKE 1 TABLET BY MOUTH AS NEEDED FOR ERECTILE DYSFUNCTION. 15 tablet 0   Current Facility-Administered Medications  Medication Dose Route Frequency Provider Last Rate Last Dose  . triamcinolone acetonide (KENALOG) 10 MG/ML injection 10 mg  10 mg Other Once Wallene Huh, DPM        Allergies  Allergen Reactions  . Ace Inhibitors Swelling  . Angiotensin Receptor Blockers Swelling  . Aspirin Hives  . Codeine Nausea And Vomiting  . Irbesartan Other (See Comments)    REACTION: allergic to ARBs w/ angioedema  . Ramipril Other (See Comments)    REACTION: Allergic to ACE's w/ angioedema...    Social History   Social History  . Marital status: Married    Spouse name: N/A  . Number of children: 1  . Years of education: N/A   Occupational History  . [employer] History Main Topics  . Smoking status: Former Smoker    Packs/day: 1.00    Years: 20.00    Types: Cigarettes, Cigars    Quit date: 05/08/1998  . Smokeless tobacco: Never Used     Comment: quit in 2005  . Alcohol use 0.6 oz/week    1 Standard drinks or equivalent per week     Comment: social drinker  . Drug use: No  . Sexual activity: Yes   Other Topics Concern  . Not on file   Social History Narrative  . No narrative on file    Family History  Problem Relation Age of Onset  . Colon cancer Father   . Stroke Mother   . Heart disease Mother   . Hypertension Sister   . Diabetes Sister   . Colon cancer Paternal Uncle     Review of Systems:  As stated in the HPI and otherwise negative.   BP 140/80 (BP Location: Left Arm, Patient Position: Sitting, Cuff Size: Normal)   Pulse 72   Ht 5\' 10"  (1.778 m)   Wt 221 lb (100.2 kg)   SpO2 95%   BMI 31.71 kg/m   Physical Examination: General: Well developed, well nourished, NAD  HEENT: OP clear, mucus membranes moist  SKIN: warm, dry. No rashes. Neuro: No focal deficits    Musculoskeletal: Muscle strength 5/5 all ext  Psychiatric: Mood and affect normal  Neck: No JVD, no carotid bruits, no thyromegaly, no lymphadenopathy.  Lungs:Clear bilaterally, no wheezes, rhonci, crackles Cardiovascular: Regular rate and rhythm. No murmurs, gallops or rubs. Abdomen:Soft. Bowel sounds present. Non-tender.  Extremities: Trace bilateral lower extremity edema. Pulses are 2 + in the bilateral DP/PT.  Echo 08/30/13: Left ventricle: The cavity size was normal. Systolic function was normal. The  estimated ejection fraction was in the range of 55% to 60%. Probable mild hypokinesis of the basal-midinferolateral and inferior myocardium. Doppler parameters are consistent with abnormal left ventricular relaxation (grade 1 diastolic dysfunction). - Mitral valve: Calcified annulus.  Stress myoview 10/03/13: Stress Procedure: The patient received IV Lexiscan 0.4 mg over 15-seconds. Technetium 106m Sestamibi injected at 30-seconds. This patient had sob and an "Uncomfortable Fullness" with the Lexiscan injection. Quantitative spect images were obtained after a 45 minute delay.  Stress ECG: No significant change from baseline ECG  QPS  Raw Data Images: Normal; no motion artifact; normal heart/lung ratio.  Stress Images: There is a large size severe severity perfusion defect in the basal and mid inferior and inferolateral walls.  Rest Images: There is a large size severe severity perfusion defect in the basal and mid inferior and inferolateral walls.  Subtraction (SDS): SDS 2  Transient Ischemic Dilatation (Normal <1.22): 1.06  Lung/Heart Ratio (Normal <0.45): 0.32  Quantitative Gated Spect Images  QGS EDV: 135 ml  QGS ESV: 65 ml  Impression  Exercise Capacity: Lexiscan with no exercise.  BP Response: Normal blood pressure response.  Clinical Symptoms: No significant symptoms noted.  ECG Impression: No significant ST segment change suggestive of ischemia.  Comparison with Prior  Nuclear Study: No significant change from previous study  Overall Impression: Intermediate risk stress nuclear study with a large infarct in the basal and mid inferior and inferolateral walls with mild periinfarct ischemia in the apical inferior wall (SDS 2)..  LV Ejection Fraction: 52%. LV Wall Motion: akinesis of the basal and mid inferior and inferolateral walls.   EKG:  EKG is ordered today. The ekg ordered today demonstrates NSR, rate 72 bpm. RBBB. Small inferior Q waves. Unchanged.   Recent Labs: 08/12/2015: Pro B Natriuretic peptide (BNP) 35.0 03/30/2016: ALT 18; BUN 15; Creatinine, Ser 0.96; Hemoglobin 14.7; Platelets 178.0; Potassium 4.3; Sodium 141; TSH 1.78   Lipid Panel    Component Value Date/Time   CHOL 128 03/30/2016 1056   TRIG 59.0 03/30/2016 1056   HDL 44.30 03/30/2016 1056   CHOLHDL 3 03/30/2016 1056   VLDL 11.8 03/30/2016 1056   LDLCALC 72 03/30/2016 1056     Wt Readings from Last 3 Encounters:  06/09/16 221 lb (100.2 kg)  03/30/16 219 lb (99.3 kg)  02/17/16 219 lb 6.4 oz (99.5 kg)     Other studies Reviewed: Additional studies/ records that were reviewed today include: . Review of the above records demonstrates:    Assessment and Plan:  1. CAD with unstable angina: He is having dyspnea with minimal exertion. He is having no chest pain. He is known to have a 50% left main stenosis by cath in 2004. EKG without ischemic changes. Will plan cardiac cath at [facility] on 06/17/16 with possible PCI. Risks and benefits of cath reviewed with pt today. Stress test 2015 shows inferolateral scar to be expected after his cardiac event in 2004 with occluded OM branch.   Continue current regimen. Pre-cath labs today.   2. HYPERTENSION:  Blood pressure well controlled. Continue current regimen.   3. HYPERLIPIDEMIA: LDL is at goal. Continue statin.   Current medicines are reviewed at length with the patient today.  The patient does not have concerns regarding medicines.  The  following changes have been made:  no change  Labs/ tests ordered today include:  No orders of the defined types were placed in this encounter.   Disposition:   F/U with me after his cath.  Signed, Lauree Chandler, MD 06/09/2016 3:39 PM    [facility] [place], [place], [place]  [address] Phone: [phone]; Fax: [phone]

## 2016-06-09 NOTE — Patient Instructions (Signed)
Medication Instructions:  Your physician recommends that you continue on your current medications as directed. Please refer to the Current Medication list given to you today.   Labwork: Lab work to be done today--BMP, CBC, PT  Testing/Procedures: Your physician has requested that you have a cardiac catheterization. Cardiac catheterization is used to diagnose and/or treat various heart conditions. Doctors may recommend this procedure for a number of different reasons. The most common reason is to evaluate chest pain. Chest pain can be a symptom of coronary artery disease (CAD), and cardiac catheterization can show whether plaque is narrowing or blocking your heart's arteries. This procedure is also used to evaluate the valves, as well as measure the blood flow and oxygen levels in different parts of your heart. For further information please visit [email or web address]. Please follow instruction sheet, as given. Scheduled for March 1,2018   Follow-Up: Your physician recommends that you schedule a follow-up appointment in: about 4 weeks with NP or PA.     Any Other Special Instructions Will Be Listed Below (If Applicable).     If you need a refill on your cardiac medications before your next appointment, please call your pharmacy.

## 2016-06-10 ENCOUNTER — Telehealth: Payer: Self-pay | Admitting: *Deleted

## 2016-06-10 LAB — CBC WITH DIFFERENTIAL/PLATELET
BASOS ABS: 0 10*3/uL (ref 0.0–0.2)
Basos: 0 %
EOS (ABSOLUTE): 0.3 10*3/uL (ref 0.0–0.4)
Eos: 3 %
HEMOGLOBIN: 13.8 g/dL (ref 13.0–17.7)
Hematocrit: 40.3 % (ref 37.5–51.0)
IMMATURE GRANULOCYTES: 0 %
Immature Grans (Abs): 0 10*3/uL (ref 0.0–0.1)
LYMPHS: 27 %
Lymphocytes Absolute: 2.4 10*3/uL (ref 0.7–3.1)
MCH: 31 pg (ref 26.6–33.0)
MCHC: 34.2 g/dL (ref 31.5–35.7)
MCV: 91 fL (ref 79–97)
MONOCYTES: 10 %
Monocytes Absolute: 0.8 10*3/uL (ref 0.1–0.9)
NEUTROS PCT: 60 %
Neutrophils Absolute: 5.2 10*3/uL (ref 1.4–7.0)
Platelets: 156 10*3/uL (ref 150–379)
RBC: 4.45 x10E6/uL (ref 4.14–5.80)
RDW: 13.3 % (ref 12.3–15.4)
WBC: 8.7 10*3/uL (ref 3.4–10.8)

## 2016-06-10 LAB — BASIC METABOLIC PANEL
BUN / CREAT RATIO: 14 (ref 10–24)
BUN: 19 mg/dL (ref 8–27)
CHLORIDE: 101 mmol/L (ref 96–106)
CO2: 25 mmol/L (ref 18–29)
Calcium: 9.2 mg/dL (ref 8.6–10.2)
Creatinine, Ser: 1.38 mg/dL — ABNORMAL HIGH (ref 0.76–1.27)
GFR calc non Af Amer: 53 — ABNORMAL LOW (ref >59)
GFR, EST AFRICAN AMERICAN: 61 (ref >59)
Glucose: 93 mg/dL (ref 65–99)
POTASSIUM: 4.5 mmol/L (ref 3.5–5.2)
Sodium: 143 mmol/L (ref 134–144)

## 2016-06-10 LAB — PROTIME-INR
INR: 1 (ref 0.8–1.2)
PROTHROMBIN TIME: 10.4 s (ref 9.1–12.0)

## 2016-06-10 NOTE — Telephone Encounter (Signed)
I spoke with pt and reviewed lab results and recommendations from Dr. Angelena Form with him. Pt has additional questions regarding cath and would like to know if previous known blockage can be fixed.  He reports being very anxious about procedure as family members have had problems when having cath.

## 2016-06-11 ENCOUNTER — Other Ambulatory Visit: Payer: Self-pay | Admitting: Allergy and Immunology

## 2016-06-11 ENCOUNTER — Other Ambulatory Visit: Payer: Self-pay | Admitting: Cardiovascular Disease

## 2016-06-11 NOTE — Telephone Encounter (Signed)
I spoke to him today about his cath. cdm

## 2016-06-13 ENCOUNTER — Other Ambulatory Visit: Payer: Self-pay | Admitting: Pulmonary Disease

## 2016-06-13 ENCOUNTER — Other Ambulatory Visit: Payer: Self-pay | Admitting: Cardiovascular Disease

## 2016-06-13 ENCOUNTER — Encounter: Payer: Self-pay | Admitting: Internal Medicine

## 2016-06-16 ENCOUNTER — Other Ambulatory Visit: Payer: Self-pay | Admitting: Cardiovascular Disease

## 2016-06-16 NOTE — Telephone Encounter (Signed)
Okay to refill? Please advise. Thanks, MI 

## 2016-06-17 ENCOUNTER — Encounter (HOSPITAL_COMMUNITY): Payer: Self-pay | Admitting: Cardiovascular Disease

## 2016-06-17 ENCOUNTER — Encounter (HOSPITAL_COMMUNITY)
Admission: RE | Disposition: A | Discharge status: Home / Self Care | Payer: Self-pay | Source: Ambulatory Visit | Attending: Cardiovascular Disease

## 2016-06-17 ENCOUNTER — Ambulatory Visit (HOSPITAL_COMMUNITY)
Admission: RE | Admit: 2016-06-17 | Discharge: 2016-06-17 | Disposition: A | Discharge status: Home / Self Care | Payer: Medicare Other | Source: Ambulatory Visit | Attending: Cardiovascular Disease | Admitting: Cardiovascular Disease

## 2016-06-17 DIAGNOSIS — G4733 Obstructive sleep apnea (adult) (pediatric): Secondary | ICD-10-CM | POA: Insufficient documentation

## 2016-06-17 DIAGNOSIS — R911 Solitary pulmonary nodule: Secondary | ICD-10-CM | POA: Insufficient documentation

## 2016-06-17 DIAGNOSIS — I25110 Atherosclerotic heart disease of native coronary artery with unstable angina pectoris: Secondary | ICD-10-CM | POA: Insufficient documentation

## 2016-06-17 DIAGNOSIS — F419 Anxiety disorder, unspecified: Secondary | ICD-10-CM | POA: Diagnosis not present

## 2016-06-17 DIAGNOSIS — K219 Gastro-esophageal reflux disease without esophagitis: Secondary | ICD-10-CM | POA: Insufficient documentation

## 2016-06-17 DIAGNOSIS — I2510 Atherosclerotic heart disease of native coronary artery without angina pectoris: Secondary | ICD-10-CM | POA: Diagnosis not present

## 2016-06-17 DIAGNOSIS — E119 Type 2 diabetes mellitus without complications: Secondary | ICD-10-CM | POA: Diagnosis not present

## 2016-06-17 DIAGNOSIS — M1990 Unspecified osteoarthritis, unspecified site: Secondary | ICD-10-CM | POA: Diagnosis not present

## 2016-06-17 DIAGNOSIS — R600 Localized edema: Secondary | ICD-10-CM | POA: Diagnosis not present

## 2016-06-17 DIAGNOSIS — M545 Low back pain: Secondary | ICD-10-CM | POA: Insufficient documentation

## 2016-06-17 DIAGNOSIS — I252 Old myocardial infarction: Secondary | ICD-10-CM | POA: Insufficient documentation

## 2016-06-17 DIAGNOSIS — I10 Essential (primary) hypertension: Secondary | ICD-10-CM | POA: Diagnosis not present

## 2016-06-17 DIAGNOSIS — Z8249 Family history of ischemic heart disease and other diseases of the circulatory system: Secondary | ICD-10-CM | POA: Diagnosis not present

## 2016-06-17 DIAGNOSIS — R0609 Other forms of dyspnea: Secondary | ICD-10-CM | POA: Diagnosis present

## 2016-06-17 DIAGNOSIS — Z87891 Personal history of nicotine dependence: Secondary | ICD-10-CM | POA: Insufficient documentation

## 2016-06-17 DIAGNOSIS — Z823 Family history of stroke: Secondary | ICD-10-CM | POA: Insufficient documentation

## 2016-06-17 DIAGNOSIS — Z885 Allergy status to narcotic agent status: Secondary | ICD-10-CM | POA: Diagnosis not present

## 2016-06-17 DIAGNOSIS — Z7902 Long term (current) use of antithrombotics/antiplatelets: Secondary | ICD-10-CM | POA: Insufficient documentation

## 2016-06-17 DIAGNOSIS — I4510 Unspecified right bundle-branch block: Secondary | ICD-10-CM | POA: Diagnosis not present

## 2016-06-17 DIAGNOSIS — J449 Chronic obstructive pulmonary disease, unspecified: Secondary | ICD-10-CM | POA: Diagnosis not present

## 2016-06-17 DIAGNOSIS — E785 Hyperlipidemia, unspecified: Secondary | ICD-10-CM | POA: Diagnosis not present

## 2016-06-17 DIAGNOSIS — Z833 Family history of diabetes mellitus: Secondary | ICD-10-CM | POA: Diagnosis not present

## 2016-06-17 HISTORY — PX: LEFT HEART CATH AND CORONARY ANGIOGRAPHY: CATH118249

## 2016-06-17 SURGERY — LEFT HEART CATH AND CORONARY ANGIOGRAPHY
Anesthesia: LOCAL

## 2016-06-17 MED ORDER — IOPAMIDOL (ISOVUE-370) INJECTION 76%
INTRAVENOUS | Status: AC
  Filled 2016-06-17: qty 100

## 2016-06-17 MED ORDER — MIDAZOLAM HCL 2 MG/2ML IJ SOLN
INTRAMUSCULAR | Status: AC
  Filled 2016-06-17: qty 2

## 2016-06-17 MED ORDER — IOPAMIDOL (ISOVUE-370) INJECTION 76%
INTRAVENOUS | Status: DC | PRN
  Administered 2016-06-17: 95 mL via INTRA_ARTERIAL

## 2016-06-17 MED ORDER — SODIUM CHLORIDE 0.9% FLUSH: 3.0000 mL | INTRAVENOUS | Status: DC | PRN

## 2016-06-17 MED ORDER — VERAPAMIL HCL 2.5 MG/ML IV SOLN
INTRAVENOUS | Status: AC
  Filled 2016-06-17: qty 2

## 2016-06-17 MED ORDER — FENTANYL CITRATE (PF) 100 MCG/2ML IJ SOLN
INTRAMUSCULAR | Status: AC
Start: 2016-06-17 — End: 2016-06-17
  Filled 2016-06-17: qty 2

## 2016-06-17 MED ORDER — SODIUM CHLORIDE 0.9% FLUSH: 3.0000 mL | Freq: Two times a day (BID) | INTRAVENOUS | Status: DC

## 2016-06-17 MED ORDER — MIDAZOLAM HCL 2 MG/2ML IJ SOLN
INTRAMUSCULAR | Status: DC | PRN
  Administered 2016-06-17: 1 mg via INTRAVENOUS
  Administered 2016-06-17: 2 mg via INTRAVENOUS

## 2016-06-17 MED ORDER — SODIUM CHLORIDE 0.9 % IV SOLN: 250.0000 mL | INTRAVENOUS | Status: DC | PRN

## 2016-06-17 MED ORDER — HEPARIN (PORCINE) IN NACL 2-0.9 UNIT/ML-% IJ SOLN
INTRAMUSCULAR | Status: AC
  Filled 2016-06-17: qty 1000

## 2016-06-17 MED ORDER — HEPARIN (PORCINE) IN NACL 2-0.9 UNIT/ML-% IJ SOLN
INTRAMUSCULAR | Status: DC | PRN
  Administered 2016-06-17: 1000 mL

## 2016-06-17 MED ORDER — VERAPAMIL HCL 2.5 MG/ML IV SOLN
INTRAVENOUS | Status: DC | PRN
  Administered 2016-06-17: 10 mL via INTRA_ARTERIAL

## 2016-06-17 MED ORDER — LIDOCAINE HCL (PF) 1 % IJ SOLN
INTRAMUSCULAR | Status: AC
  Filled 2016-06-17: qty 30

## 2016-06-17 MED ORDER — HEPARIN SODIUM (PORCINE) 1000 UNIT/ML IJ SOLN
INTRAMUSCULAR | Status: DC | PRN
  Administered 2016-06-17: 5000 [IU] via INTRAVENOUS

## 2016-06-17 MED ORDER — SODIUM CHLORIDE 0.9 % IV SOLN
INTRAVENOUS | Status: AC
  Administered 2016-06-17: 08:00:00 via INTRAVENOUS

## 2016-06-17 MED ORDER — SODIUM CHLORIDE 0.9 % IV SOLN
250.0000 mL | INTRAVENOUS | Status: DC | PRN
Start: 2016-06-17 — End: 2016-06-17

## 2016-06-17 MED ORDER — FENTANYL CITRATE (PF) 100 MCG/2ML IJ SOLN
INTRAMUSCULAR | Status: DC | PRN
  Administered 2016-06-17: 50 ug via INTRAVENOUS
  Administered 2016-06-17: 25 ug via INTRAVENOUS

## 2016-06-17 MED ORDER — LIDOCAINE HCL (PF) 1 % IJ SOLN
INTRAMUSCULAR | Status: DC | PRN
  Administered 2016-06-17: 2 mL via SUBCUTANEOUS

## 2016-06-17 MED ORDER — SODIUM CHLORIDE 0.9 % IV SOLN: INTRAVENOUS | Status: AC

## 2016-06-17 MED ORDER — HEPARIN SODIUM (PORCINE) 1000 UNIT/ML IJ SOLN
INTRAMUSCULAR | Status: AC
  Filled 2016-06-17: qty 1

## 2016-06-17 SURGICAL SUPPLY — 11 items
CATH 5FR JL3.5 JR4 ANG PIG MP (CATHETERS) ×2 IMPLANT
CATH EXPO 5FR AL1 (CATHETERS) ×2 IMPLANT
DEVICE RAD COMP TR BAND LRG (VASCULAR PRODUCTS) ×2 IMPLANT
GLIDESHEATH SLEND SS 6F .021 (SHEATH) ×2 IMPLANT
GUIDEWIRE INQWIRE 1.5J.035X260 (WIRE) ×1 IMPLANT
INQWIRE 1.5J .035X260CM (WIRE) ×2
KIT HEART LEFT (KITS) ×2 IMPLANT
PACK CARDIAC CATHETERIZATION (CUSTOM PROCEDURE TRAY) ×2 IMPLANT
SYR MEDRAD MARK V 150ML (SYRINGE) ×2 IMPLANT
TRANSDUCER W/STOPCOCK (MISCELLANEOUS) ×2 IMPLANT
TUBING CIL FLEX 10 FLL-RA (TUBING) ×2 IMPLANT

## 2016-06-17 NOTE — Discharge Instructions (Signed)
Radial Site Care °Refer to this sheet in the next few weeks. These instructions provide you with information about caring for yourself after your procedure. Your health care provider may also give you more specific instructions. Your treatment has been planned according to current medical practices, but problems sometimes occur. Call your health care provider if you have any problems or questions after your procedure. °What can I expect after the procedure? °After your procedure, it is typical to have the following: °· Bruising at the radial site that usually fades within 1-2 weeks. °· Blood collecting in the tissue (hematoma) that may be painful to the touch. It should usually decrease in size and tenderness within 1-2 weeks. °Follow these instructions at home: °· Take medicines only as directed by your health care provider. °· You may shower 24-48 hours after the procedure or as directed by your health care provider. Remove the bandage (dressing) and gently wash the site with plain soap and water. Pat the area dry with a clean towel. Do not rub the site, because this may cause bleeding. °· Do not take baths, swim, or use a hot tub until your health care provider approves. °· Check your insertion site every day for redness, swelling, or drainage. °· Do not apply powder or lotion to the site. °· Do not flex or bend the affected arm for 24 hours or as directed by your health care provider. °· Do not push or pull heavy objects with the affected arm for 24 hours or as directed by your health care provider. °· Do not lift over 10 lb (4.5 kg) for 5 days after your procedure or as directed by your health care provider. °· Ask your health care provider when it is okay to: °¨ Return to work or school. °¨ Resume usual physical activities or sports. °¨ Resume sexual activity. °· Do not drive home if you are discharged the same day as the procedure. Have someone else drive you. °· You may drive 24 hours after the procedure  unless otherwise instructed by your health care provider. °· Do not operate machinery or power tools for 24 hours after the procedure. °· If your procedure was done as an outpatient procedure, which means that you went home the same day as your procedure, a responsible adult should be with you for the first 24 hours after you arrive home. °· Keep all follow-up visits as directed by your health care provider. This is important. °Contact a health care provider if: °· You have a fever. °· You have chills. °· You have increased bleeding from the radial site. Hold pressure on the site. °Get help right away if: °· You have unusual pain at the radial site. °· You have redness, warmth, or swelling at the radial site. °· You have drainage (other than a small amount of blood on the dressing) from the radial site. °· The radial site is bleeding, and the bleeding does not stop after 30 minutes of holding steady pressure on the site. °· Your arm or hand becomes pale, cool, tingly, or numb. °This information is not intended to replace advice given to you by your health care provider. Make sure you discuss any questions you have with your health care provider. °Document Released: 05/08/2010 Document Revised: 09/11/2015 Document Reviewed: 10/22/2013 °Elsevier Interactive Patient Education © 2017 [employer] ° °

## 2016-06-17 NOTE — H&P (View-Only) (Signed)
Chief Complaint  Patient presents with  . Coronary Artery Disease    NO CP.  Marland Kitchen Hyperlipidemia  . Edema    IN LEGS.  Marland Kitchen Shortness of Breath    WITH EXERTION     History of Present Illness: 66 yo male with history of CAD, HLD, HTN, OSA here today for cardiac follow up. He has been followed in the past by Dr. Haroldine Laws. 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 He also has residual 50% left main stenosis and nonobstructive disease elsewhere. Cardiac CT in 05/2009 with LM < 50% LAD 50% LCX dominant nonobs RCA small no critical lesions. Small lung nodule. F/u CT showed stable pulm nodule. Carotid u/s 11/11 with minimal carotid plaque. Exercise treadmill stress test 08/30/13 but did not achieve target heart rate. Lexiscan stress myoview 10/03/13 with inferolateral scar, no ischemia, LVEF=52%.   He is here today for follow up. He is not feeling well. He has had progression of dyspnea with minimal exertion without chest pain over the last 6 months. This is occurring every day. He also has mild LE edema. No recent fever or chills.   Primary Care Physician: [name], MD   Past Medical History:  Diagnosis Date  . Anxiety   . CAD    --8/09: ETT normal --acute inferior lateral wall infarction in September 2004 treated medically.  --cardiac CT 2006  residual 50% left main stenosis and nonobstructive disease elsewhere. EF normal   --cardiac CT 05/2009. LM. <50% LAD. 50%  . Diabetes mellitus   . Diverticulosis   . DJD (degenerative joint disease)   . GERD (gastroesophageal reflux disease)   . HTN (hypertension)   . Hyperlipidemia   . Low back pain syndrome   . Myocardial infarction 2004  . OSA (obstructive sleep apnea)   . Pharyngitis   . Recurrent aspiration bronchitis/pneumonia ([place])   . Tubular adenoma of colon 2014  . URI (upper respiratory infection)     Past Surgical History:  Procedure Laterality Date  . CARDIAC CATHETERIZATION    .  UVULOPALATOPHARYNGOPLASTY     surgery for OSA    Current Outpatient Prescriptions  Medication Sig Dispense Refill  . albuterol (PROVENTIL) (2.5 MG/3ML) 0.083% nebulizer solution Take 3 mLs (2.5 mg total) by nebulization every 4 (four) hours as needed for wheezing or shortness of breath. 75 mL 1  . ALPRAZolam (XANAX) 0.5 MG tablet Take 1 tablet (0.5 mg total) by mouth 3 (three) times daily as needed. 90 tablet 3  . amLODipine (NORVASC) 5 MG tablet TAKE 1 TABLET BY MOUTH EVERY DAY 90 tablet 1  . ammonium lactate (LAC-HYDRIN) 12 % lotion Apply topically as directed. 400 g 3  . atorvastatin (LIPITOR) 40 MG tablet Take 1 tablet (40 mg total) by mouth daily. 90 tablet 0  . budesonide-formoterol (SYMBICORT) 160-4.5 MCG/ACT inhaler INHALE 2 PUFFS INTO THE LUNGS 2 (TWO) TIMES DAILY. 3 Inhaler 3  . buPROPion (WELLBUTRIN SR) 150 MG 12 hr tablet Take 1 tablet (150 mg total) by mouth 2 (two) times daily. 180 tablet 1  . carvedilol (COREG) 12.5 MG tablet TAKE 1 TABLET BY MOUTH TWICE A DAY WITH A MEAL 180 tablet 0  . cetirizine (ZYRTEC) 10 MG tablet Take 1 tablet (10 mg total) by mouth daily. 30 tablet 11  . Cholecalciferol (VITAMIN D3) 5000 UNITS CAPS Take one tablet by mouth daily    . clopidogrel (PLAVIX) 75 MG tablet Take 1 tablet (75 mg total) by  mouth daily. 90 tablet 1  . clotrimazole-betamethasone (LOTRISONE) cream APPLY AS DIRECTED 45 g 11  . Coenzyme Q10 (COQ10) 100 MG CAPS Take as directed 30 each 11  . cyanocobalamin 1000 MCG tablet Take 1,000 mcg by mouth daily.    . furosemide (LASIX) 40 MG tablet TAKE 1 TABLET BY MOUTH EVERY DAY AS NEEDED 90 tablet 0  . montelukast (SINGULAIR) 10 MG tablet TAKE 1 TABLET BY MOUTH AT BEDTIME 90 tablet 0  . nitroGLYCERIN (NITROSTAT) 0.4 MG SL tablet Place 1 tablet (0.4 mg total) under the tongue every 5 (five) minutes as needed. For up to 3 doses. 25 tablet 6  . pantoprazole (PROTONIX) 40 MG tablet TAKE 1 TABLET BY MOUTH EVERY DAY 30 MINUTES BEFORE DINNERTIME 90  tablet 0  . sildenafil (VIAGRA) 100 MG tablet TAKE 1 TABLET BY MOUTH AS NEEDED FOR ERECTILE DYSFUNCTION. 15 tablet 0   Current Facility-Administered Medications  Medication Dose Route Frequency Provider Last Rate Last Dose  . triamcinolone acetonide (KENALOG) 10 MG/ML injection 10 mg  10 mg Other Once Wallene Huh, DPM        Allergies  Allergen Reactions  . Ace Inhibitors Swelling  . Angiotensin Receptor Blockers Swelling  . Aspirin Hives  . Codeine Nausea And Vomiting  . Irbesartan Other (See Comments)    REACTION: allergic to ARBs w/ angioedema  . Ramipril Other (See Comments)    REACTION: Allergic to ACE's w/ angioedema...    Social History   Social History  . Marital status: Married    Spouse name: N/A  . Number of children: 1  . Years of education: N/A   Occupational History  . [employer] History Main Topics  . Smoking status: Former Smoker    Packs/day: 1.00    Years: 20.00    Types: Cigarettes, Cigars    Quit date: 05/08/1998  . Smokeless tobacco: Never Used     Comment: quit in 2005  . Alcohol use 0.6 oz/week    1 Standard drinks or equivalent per week     Comment: social drinker  . Drug use: No  . Sexual activity: Yes   Other Topics Concern  . Not on file   Social History Narrative  . No narrative on file    Family History  Problem Relation Age of Onset  . Colon cancer Father   . Stroke Mother   . Heart disease Mother   . Hypertension Sister   . Diabetes Sister   . Colon cancer Paternal Uncle     Review of Systems:  As stated in the HPI and otherwise negative.   BP 140/80 (BP Location: Left Arm, Patient Position: Sitting, Cuff Size: Normal)   Pulse 72   Ht 5\' 10"  (1.778 m)   Wt 221 lb (100.2 kg)   SpO2 95%   BMI 31.71 kg/m   Physical Examination: General: Well developed, well nourished, NAD  HEENT: OP clear, mucus membranes moist  SKIN: warm, dry. No rashes. Neuro: No focal deficits    Musculoskeletal: Muscle strength 5/5 all ext  Psychiatric: Mood and affect normal  Neck: No JVD, no carotid bruits, no thyromegaly, no lymphadenopathy.  Lungs:Clear bilaterally, no wheezes, rhonci, crackles Cardiovascular: Regular rate and rhythm. No murmurs, gallops or rubs. Abdomen:Soft. Bowel sounds present. Non-tender.  Extremities: Trace bilateral lower extremity edema. Pulses are 2 + in the bilateral DP/PT.  Echo 08/30/13: Left ventricle: The cavity size was normal. Systolic function was normal. The  estimated ejection fraction was in the range of 55% to 60%. Probable mild hypokinesis of the basal-midinferolateral and inferior myocardium. Doppler parameters are consistent with abnormal left ventricular relaxation (grade 1 diastolic dysfunction). - Mitral valve: Calcified annulus.  Stress myoview 10/03/13: Stress Procedure: The patient received IV Lexiscan 0.4 mg over 15-seconds. Technetium 58m Sestamibi injected at 30-seconds. This patient had sob and an "Uncomfortable Fullness" with the Lexiscan injection. Quantitative spect images were obtained after a 45 minute delay.  Stress ECG: No significant change from baseline ECG  QPS  Raw Data Images: Normal; no motion artifact; normal heart/lung ratio.  Stress Images: There is a large size severe severity perfusion defect in the basal and mid inferior and inferolateral walls.  Rest Images: There is a large size severe severity perfusion defect in the basal and mid inferior and inferolateral walls.  Subtraction (SDS): SDS 2  Transient Ischemic Dilatation (Normal <1.22): 1.06  Lung/Heart Ratio (Normal <0.45): 0.32  Quantitative Gated Spect Images  QGS EDV: 135 ml  QGS ESV: 65 ml  Impression  Exercise Capacity: Lexiscan with no exercise.  BP Response: Normal blood pressure response.  Clinical Symptoms: No significant symptoms noted.  ECG Impression: No significant ST segment change suggestive of ischemia.  Comparison with Prior  Nuclear Study: No significant change from previous study  Overall Impression: Intermediate risk stress nuclear study with a large infarct in the basal and mid inferior and inferolateral walls with mild periinfarct ischemia in the apical inferior wall (SDS 2)..  LV Ejection Fraction: 52%. LV Wall Motion: akinesis of the basal and mid inferior and inferolateral walls.   EKG:  EKG is ordered today. The ekg ordered today demonstrates NSR, rate 72 bpm. RBBB. Small inferior Q waves. Unchanged.   Recent Labs: 08/12/2015: Pro B Natriuretic peptide (BNP) 35.0 03/30/2016: ALT 18; BUN 15; Creatinine, Ser 0.96; Hemoglobin 14.7; Platelets 178.0; Potassium 4.3; Sodium 141; TSH 1.78   Lipid Panel    Component Value Date/Time   CHOL 128 03/30/2016 1056   TRIG 59.0 03/30/2016 1056   HDL 44.30 03/30/2016 1056   CHOLHDL 3 03/30/2016 1056   VLDL 11.8 03/30/2016 1056   LDLCALC 72 03/30/2016 1056     Wt Readings from Last 3 Encounters:  06/09/16 221 lb (100.2 kg)  03/30/16 219 lb (99.3 kg)  02/17/16 219 lb 6.4 oz (99.5 kg)     Other studies Reviewed: Additional studies/ records that were reviewed today include: . Review of the above records demonstrates:    Assessment and Plan:  1. CAD with unstable angina: He is having dyspnea with minimal exertion. He is having no chest pain. He is known to have a 50% left main stenosis by cath in 2004. EKG without ischemic changes. Will plan cardiac cath at [facility] on 06/17/16 with possible PCI. Risks and benefits of cath reviewed with pt today. Stress test 2015 shows inferolateral scar to be expected after his cardiac event in 2004 with occluded OM branch.   Continue current regimen. Pre-cath labs today.   2. HYPERTENSION:  Blood pressure well controlled. Continue current regimen.   3. HYPERLIPIDEMIA: LDL is at goal. Continue statin.   Current medicines are reviewed at length with the patient today.  The patient does not have concerns regarding medicines.  The  following changes have been made:  no change  Labs/ tests ordered today include:  No orders of the defined types were placed in this encounter.   Disposition:   F/U with me after his cath.  Signed, Lauree Chandler, MD 06/09/2016 3:39 PM    [facility] [address], [place], [place]  [address] Phone: [phone]; Fax: [phone]

## 2016-06-17 NOTE — Interval H&P Note (Signed)
History and Physical Interval Note:  06/17/2016 9:38 AM  Larry Stone  has presented today for surgery, with the diagnosis of unstable angina  The various methods of treatment have been discussed with the patient and family. After consideration of risks, benefits and other options for treatment, the patient has consented to  Procedure(s): Left Heart Cath and Coronary Angiography (N/A) as a surgical intervention .  The patient's history has been reviewed, patient examined, no change in status, stable for surgery.  I have reviewed the patient's chart and labs.  Questions were answered to the patient's satisfaction.    Cath Lab Visit (complete for each Cath Lab visit)  Clinical Evaluation Leading to the Procedure:   ACS: No.  Non-ACS:    Anginal Classification: CCS III  Anti-ischemic medical therapy: Maximal Therapy (2 or more classes of medications)  Non-Invasive Test Results: No non-invasive testing performed  Prior CABG: No previous CABG        Lauree Chandler

## 2016-06-23 NOTE — Telephone Encounter (Signed)
OK to fill this/these prescription(s) with additional refills x0 Keep ov Thank you!

## 2016-06-30 ENCOUNTER — Other Ambulatory Visit (INDEPENDENT_AMBULATORY_CARE_PROVIDER_SITE_OTHER): Payer: Medicare Other

## 2016-06-30 ENCOUNTER — Ambulatory Visit (INDEPENDENT_AMBULATORY_CARE_PROVIDER_SITE_OTHER): Payer: Medicare Other | Admitting: Internal Medicine

## 2016-06-30 ENCOUNTER — Encounter: Payer: Self-pay | Admitting: Internal Medicine

## 2016-06-30 VITALS — BP 138/84 | HR 66 | Temp 97.7°F | Resp 16 | Ht 70.0 in | Wt 220.5 lb

## 2016-06-30 DIAGNOSIS — I2510 Atherosclerotic heart disease of native coronary artery without angina pectoris: Secondary | ICD-10-CM | POA: Diagnosis not present

## 2016-06-30 DIAGNOSIS — R5383 Other fatigue: Secondary | ICD-10-CM | POA: Insufficient documentation

## 2016-06-30 DIAGNOSIS — R7989 Other specified abnormal findings of blood chemistry: Secondary | ICD-10-CM | POA: Diagnosis not present

## 2016-06-30 DIAGNOSIS — I10 Essential (primary) hypertension: Secondary | ICD-10-CM | POA: Diagnosis not present

## 2016-06-30 DIAGNOSIS — R739 Hyperglycemia, unspecified: Secondary | ICD-10-CM | POA: Diagnosis not present

## 2016-06-30 DIAGNOSIS — M25561 Pain in right knee: Secondary | ICD-10-CM | POA: Insufficient documentation

## 2016-06-30 DIAGNOSIS — M2550 Pain in unspecified joint: Secondary | ICD-10-CM

## 2016-06-30 LAB — BASIC METABOLIC PANEL
BUN: 20 mg/dL (ref 6–23)
CHLORIDE: 105 meq/L (ref 96–112)
CO2: 30 mEq/L (ref 19–32)
Calcium: 9.5 mg/dL (ref 8.4–10.5)
Creatinine, Ser: 1.1 mg/dL (ref 0.40–1.50)
GFR: 71.16 mL/min (ref >60.00)
Glucose, Bld: 98 mg/dL (ref 70–99)
POTASSIUM: 4.2 meq/L (ref 3.5–5.1)
Sodium: 141 mEq/L (ref 135–145)

## 2016-06-30 LAB — TESTOSTERONE: TESTOSTERONE: 242.62 ng/dL — AB (ref 300.00–890.00)

## 2016-06-30 LAB — CK: CK TOTAL: 80 U/L (ref 7–232)

## 2016-06-30 NOTE — Progress Notes (Signed)
Subjective:  Patient ID: Larry Stone, male    DOB: [birth year]  Age: 66 y.o. MRN: [id number]  CC: Follow-up (HTN, )   HPI Larry Stone presents for CAD - pt had a heart cath, elev creat, elev glu, HTN f/u. C/o fatigue. No OSA.  Outpatient Medications Prior to Visit  Medication Sig Dispense Refill  . albuterol (PROVENTIL) (2.5 MG/3ML) 0.083% nebulizer solution Use one vial in the nebulizer every 4-6 hours if needed for cough or wheeze 225 mL 1  . ALPRAZolam (XANAX) 0.5 MG tablet Take 1 tablet (0.5 mg total) by mouth 3 (three) times daily as needed. 90 tablet 3  . amLODipine (NORVASC) 5 MG tablet TAKE 1 TABLET BY MOUTH EVERY DAY 90 tablet 1  . ammonium lactate (LAC-HYDRIN) 12 % lotion Apply topically as directed. (Patient taking differently: Apply 1 application topically daily as needed for dry skin. ) 400 g 3  . atorvastatin (LIPITOR) 40 MG tablet Take 1 tablet (40 mg total) by mouth daily. 90 tablet 0  . budesonide-formoterol (SYMBICORT) 160-4.5 MCG/ACT inhaler INHALE 2 PUFFS INTO THE LUNGS 2 (TWO) TIMES DAILY. 3 Inhaler 3  . buPROPion (WELLBUTRIN SR) 150 MG 12 hr tablet Take 1 tablet (150 mg total) by mouth 2 (two) times daily. 180 tablet 1  . carvedilol (COREG) 12.5 MG tablet TAKE 1 TABLET BY MOUTH TWICE A DAY WITH A MEAL 180 tablet 0  . cetirizine (ZYRTEC) 10 MG tablet Take 1 tablet (10 mg total) by mouth daily. 30 tablet 11  . Cholecalciferol (VITAMIN D3) 5000 UNITS CAPS Take one tablet by mouth daily    . clopidogrel (PLAVIX) 75 MG tablet TAKE 1 TABLET (75 MG TOTAL) BY MOUTH DAILY. 90 tablet 3  . clotrimazole-betamethasone (LOTRISONE) cream APPLY AS DIRECTED (Patient taking differently: Apply 1 application topically 2 (two) times daily as needed. APPLY AS DIRECTED) 45 g 11  . Coenzyme Q10 (COQ10) 100 MG CAPS Take as directed (Patient taking differently: Take 1 capsule by mouth daily. Take as directed) 30 each 11  . cyanocobalamin 1000 MCG tablet Take 1,000 mcg by mouth daily.      . furosemide (LASIX) 40 MG tablet TAKE 1 TABLET BY MOUTH EVERY DAY AS NEEDED 90 tablet 3  . montelukast (SINGULAIR) 10 MG tablet TAKE 1 TABLET BY MOUTH AT BEDTIME 90 tablet 0  . nitroGLYCERIN (NITROSTAT) 0.4 MG SL tablet PLACE 1 TABLET (0.4 MG TOTAL) UNDER THE TONGUE EVERY 5 (FIVE) MINUTES AS NEEDED. FOR UP TO 3 DOSES. 25 tablet 3  . pantoprazole (PROTONIX) 40 MG tablet TAKE 1 TABLET BY MOUTH EVERY DAY 30 MINUTES BEFORE DINNERTIME 90 tablet 0  . VIAGRA 100 MG tablet TAKE 1 TABLET BY MOUTH AS NEEDED FOR ERECTILE DYSFUNCTION. 15 tablet 0  . SYMBICORT 160-4.5 MCG/ACT inhaler INHALE 2 PUFFS INTO THE LUNGS 2 (TWO) TIMES DAILY. 10.2 g 5   Facility-Administered Medications Prior to Visit  Medication Dose Route Frequency Provider Last Rate Last Dose  . triamcinolone acetonide (KENALOG) 10 MG/ML injection 10 mg  10 mg Other Once Wallene Huh, DPM        ROS Review of Systems  Constitutional: Positive for fatigue. Negative for appetite change and unexpected weight change.  HENT: Negative for congestion, nosebleeds, sneezing, sore throat and trouble swallowing.   Eyes: Negative for itching and visual disturbance.  Respiratory: Negative for cough.   Cardiovascular: Negative for chest pain, palpitations and leg swelling.  Gastrointestinal: Negative for abdominal distention, blood in stool, diarrhea and  nausea.  Genitourinary: Negative for frequency and hematuria.  Musculoskeletal: Positive for arthralgias and back pain. Negative for gait problem, joint swelling and neck pain.  Skin: Negative for rash.  Neurological: Negative for dizziness, tremors, speech difficulty and weakness.  Psychiatric/Behavioral: Negative for agitation, dysphoric mood and sleep disturbance. The patient is not nervous/anxious.     Objective:  BP 138/84   Pulse 66   Temp 97.7 F (36.5 C) (Oral)   Resp 16   Ht 5\' 10"  (1.778 m)   Wt 220 lb 8 oz (100 kg)   SpO2 98%   BMI 31.64 kg/m   BP Readings from Last 3  Encounters:  06/30/16 138/84  06/17/16 (!) 146/84  06/09/16 140/80    Wt Readings from Last 3 Encounters:  06/30/16 220 lb 8 oz (100 kg)  06/17/16 210 lb (95.3 kg)  06/09/16 221 lb (100.2 kg)    Physical Exam  Constitutional: He is oriented to person, place, and time. He appears well-developed. No distress.  NAD  HENT:  Mouth/Throat: Oropharynx is clear and moist.  Eyes: Conjunctivae are normal. Pupils are equal, round, and reactive to light.  Neck: Normal range of motion. No JVD present. No thyromegaly present.  Cardiovascular: Normal rate, regular rhythm, normal heart sounds and intact distal pulses.  Exam reveals no gallop and no friction rub.   No murmur heard. Pulmonary/Chest: Effort normal and breath sounds normal. No respiratory distress. He has no wheezes. He has no rales. He exhibits no tenderness.  Abdominal: Soft. Bowel sounds are normal. He exhibits no distension and no mass. There is no tenderness. There is no rebound and no guarding.  Musculoskeletal: Normal range of motion. He exhibits no edema or tenderness.  Lymphadenopathy:    He has no cervical adenopathy.  Neurological: He is alert and oriented to person, place, and time. He has normal reflexes. No cranial nerve deficit. He exhibits normal muscle tone. He displays a negative Romberg sign. Coordination and gait normal.  Skin: Skin is warm and dry. No rash noted.  Psychiatric: He has a normal mood and affect. His behavior is normal. Judgment and thought content normal.    Lab Results  Component Value Date   WBC 8.7 06/09/2016   HGB 14.7 03/30/2016   HCT 40.3 06/09/2016   PLT 156 06/09/2016   GLUCOSE 93 06/09/2016   CHOL 128 03/30/2016   TRIG 59.0 03/30/2016   HDL 44.30 03/30/2016   LDLCALC 72 03/30/2016   ALT 18 03/30/2016   AST 16 03/30/2016   NA 143 06/09/2016   K 4.5 06/09/2016   CL 101 06/09/2016   CREATININE 1.38 (H) 06/09/2016   BUN 19 06/09/2016   CO2 25 06/09/2016   TSH 1.78 03/30/2016    PSA 0.43 03/30/2016   INR 1.0 06/09/2016   HGBA1C 5.7 10/15/2014    No results found.  Assessment & Plan:   There are no diagnoses linked to this encounter. I have discontinued Mr. [name]. I am also having him maintain his cetirizine, CoQ10, Vitamin D3, ammonium lactate, clotrimazole-betamethasone, buPROPion, budesonide-formoterol, cyanocobalamin, ALPRAZolam, pantoprazole, amLODipine, atorvastatin, carvedilol, clopidogrel, albuterol, montelukast, nitroGLYCERIN, furosemide, and VIAGRA. We will continue to administer triamcinolone acetonide.  No orders of the defined types were placed in this encounter.    Follow-up: No Follow-up on file.  [name], MD

## 2016-06-30 NOTE — Progress Notes (Signed)
Pre-visit discussion using our clinic review tool. No additional management support is needed unless otherwise documented below in the visit note.  

## 2016-06-30 NOTE — Assessment & Plan Note (Signed)
labs

## 2016-06-30 NOTE — Assessment & Plan Note (Signed)
Hold or reduce Lipitor or Norvasc for a few

## 2016-06-30 NOTE — Assessment & Plan Note (Signed)
Labs

## 2016-06-30 NOTE — Assessment & Plan Note (Signed)
Cath 3/18 OK  Coronary Angiography  Conclusion     Ost Cx to Mid Cx lesion, 20 %stenosed.  Ost LM lesion, 40 %stenosed.  1st Diag lesion, 40 %stenosed.  The left ventricular systolic function is normal.  LV end diastolic pressure is normal.  The left ventricular ejection fraction is greater than 65% by visual estimate.  There is no mitral valve regurgitation.   1. Mild non-obstructive CAD with 30-40% ostial left main stenosis, eccentric 2. Normal LV systolic function 3. Elevated filling pressures  Recommendations: Continue medical management of CAD. Will have him take Lasix daily given elevated filling pressures.

## 2016-07-06 ENCOUNTER — Ambulatory Visit: Payer: Medicare Other | Admitting: Cardiology

## 2016-07-08 ENCOUNTER — Ambulatory Visit: Payer: Medicare Other | Admitting: Physician Assistant

## 2016-07-13 ENCOUNTER — Inpatient Hospital Stay (HOSPITAL_COMMUNITY)
Admission: EM | Admit: 2016-07-13 | Discharge: 2016-07-16 | DRG: 871 | Disposition: A | Discharge status: Home / Self Care | Payer: Medicare Other | Attending: Internal Medicine | Admitting: Internal Medicine

## 2016-07-13 ENCOUNTER — Encounter (HOSPITAL_COMMUNITY): Payer: Self-pay

## 2016-07-13 ENCOUNTER — Emergency Department (HOSPITAL_COMMUNITY): Payer: Medicare Other

## 2016-07-13 DIAGNOSIS — D696 Thrombocytopenia, unspecified: Secondary | ICD-10-CM | POA: Diagnosis present

## 2016-07-13 DIAGNOSIS — I2510 Atherosclerotic heart disease of native coronary artery without angina pectoris: Secondary | ICD-10-CM | POA: Diagnosis present

## 2016-07-13 DIAGNOSIS — I5032 Chronic diastolic (congestive) heart failure: Secondary | ICD-10-CM

## 2016-07-13 DIAGNOSIS — J189 Pneumonia, unspecified organism: Secondary | ICD-10-CM

## 2016-07-13 DIAGNOSIS — Z888 Allergy status to other drugs, medicaments and biological substances status: Secondary | ICD-10-CM

## 2016-07-13 DIAGNOSIS — Z6831 Body mass index (BMI) 31.0-31.9, adult: Secondary | ICD-10-CM | POA: Diagnosis not present

## 2016-07-13 DIAGNOSIS — Z7289 Other problems related to lifestyle: Secondary | ICD-10-CM | POA: Diagnosis not present

## 2016-07-13 DIAGNOSIS — G4733 Obstructive sleep apnea (adult) (pediatric): Secondary | ICD-10-CM | POA: Diagnosis present

## 2016-07-13 DIAGNOSIS — Z885 Allergy status to narcotic agent status: Secondary | ICD-10-CM

## 2016-07-13 DIAGNOSIS — Z7951 Long term (current) use of inhaled steroids: Secondary | ICD-10-CM

## 2016-07-13 DIAGNOSIS — T380X5A Adverse effect of glucocorticoids and synthetic analogues, initial encounter: Secondary | ICD-10-CM | POA: Diagnosis present

## 2016-07-13 DIAGNOSIS — I110 Hypertensive heart disease with heart failure: Secondary | ICD-10-CM | POA: Diagnosis present

## 2016-07-13 DIAGNOSIS — J101 Influenza due to other identified influenza virus with other respiratory manifestations: Secondary | ICD-10-CM | POA: Diagnosis present

## 2016-07-13 DIAGNOSIS — J440 Chronic obstructive pulmonary disease with acute lower respiratory infection: Secondary | ICD-10-CM | POA: Diagnosis present

## 2016-07-13 DIAGNOSIS — J441 Chronic obstructive pulmonary disease with (acute) exacerbation: Secondary | ICD-10-CM

## 2016-07-13 DIAGNOSIS — E785 Hyperlipidemia, unspecified: Secondary | ICD-10-CM | POA: Diagnosis present

## 2016-07-13 DIAGNOSIS — J1000 Influenza due to other identified influenza virus with unspecified type of pneumonia: Secondary | ICD-10-CM | POA: Diagnosis present

## 2016-07-13 DIAGNOSIS — E669 Obesity, unspecified: Secondary | ICD-10-CM | POA: Diagnosis present

## 2016-07-13 DIAGNOSIS — Z7902 Long term (current) use of antithrombotics/antiplatelets: Secondary | ICD-10-CM

## 2016-07-13 DIAGNOSIS — R0600 Dyspnea, unspecified: Secondary | ICD-10-CM

## 2016-07-13 DIAGNOSIS — K219 Gastro-esophageal reflux disease without esophagitis: Secondary | ICD-10-CM | POA: Diagnosis present

## 2016-07-13 DIAGNOSIS — I252 Old myocardial infarction: Secondary | ICD-10-CM

## 2016-07-13 DIAGNOSIS — E860 Dehydration: Secondary | ICD-10-CM | POA: Diagnosis present

## 2016-07-13 DIAGNOSIS — A419 Sepsis, unspecified organism: Principal | ICD-10-CM

## 2016-07-13 DIAGNOSIS — Z87891 Personal history of nicotine dependence: Secondary | ICD-10-CM

## 2016-07-13 DIAGNOSIS — J9601 Acute respiratory failure with hypoxia: Secondary | ICD-10-CM

## 2016-07-13 DIAGNOSIS — R509 Fever, unspecified: Secondary | ICD-10-CM

## 2016-07-13 DIAGNOSIS — E119 Type 2 diabetes mellitus without complications: Secondary | ICD-10-CM | POA: Diagnosis present

## 2016-07-13 DIAGNOSIS — F418 Other specified anxiety disorders: Secondary | ICD-10-CM | POA: Diagnosis present

## 2016-07-13 DIAGNOSIS — A499 Bacterial infection, unspecified: Secondary | ICD-10-CM | POA: Diagnosis present

## 2016-07-13 DIAGNOSIS — Z79899 Other long term (current) drug therapy: Secondary | ICD-10-CM

## 2016-07-13 HISTORY — DX: Chronic obstructive pulmonary disease, unspecified: J44.9

## 2016-07-13 HISTORY — DX: Pneumonitis due to inhalation of food and vomit: J69.0

## 2016-07-13 LAB — CBC WITH DIFFERENTIAL/PLATELET
Basophils Absolute: 0 10*3/uL (ref 0.0–0.1)
Basophils Relative: 0 %
EOS PCT: 0 %
Eosinophils Absolute: 0 10*3/uL (ref 0.0–0.7)
HCT: 38.6 % — ABNORMAL LOW (ref 39.0–52.0)
Hemoglobin: 12.9 g/dL — ABNORMAL LOW (ref 13.0–17.0)
LYMPHS PCT: 2 %
Lymphs Abs: 0.3 10*3/uL — ABNORMAL LOW (ref 0.7–4.0)
MCH: 31.5 pg (ref 26.0–34.0)
MCHC: 33.4 g/dL (ref 30.0–36.0)
MCV: 94.1 fL (ref 78.0–100.0)
MONO ABS: 1.4 10*3/uL — AB (ref 0.1–1.0)
MONOS PCT: 10 %
NEUTROS PCT: 88 %
Neutro Abs: 12.7 10*3/uL — ABNORMAL HIGH (ref 1.7–7.7)
PLATELETS: 136 10*3/uL — AB (ref 150–400)
RBC: 4.1 MIL/uL — ABNORMAL LOW (ref 4.22–5.81)
RDW: 12.7 % (ref 11.5–15.5)
WBC: 14.5 10*3/uL — ABNORMAL HIGH (ref 4.0–10.5)

## 2016-07-13 LAB — URINALYSIS, ROUTINE W REFLEX MICROSCOPIC
Bilirubin Urine: NEGATIVE
GLUCOSE, UA: NEGATIVE mg/dL
Hgb urine dipstick: NEGATIVE
Ketones, ur: 5 mg/dL — AB
Leukocytes, UA: NEGATIVE
Nitrite: NEGATIVE
PROTEIN: NEGATIVE mg/dL
Specific Gravity, Urine: 1.023 (ref 1.005–1.030)
pH: 5 (ref 5.0–8.0)

## 2016-07-13 LAB — COMPREHENSIVE METABOLIC PANEL
ALBUMIN: 3.5 g/dL (ref 3.5–5.0)
ALT: 25 U/L (ref 17–63)
ANION GAP: 10 (ref 5–15)
AST: 26 U/L (ref 15–41)
Alkaline Phosphatase: 91 U/L (ref 38–126)
BUN: 15 mg/dL (ref 6–20)
CALCIUM: 8.4 mg/dL — AB (ref 8.9–10.3)
CHLORIDE: 104 mmol/L (ref 101–111)
CO2: 25 mmol/L (ref 22–32)
Creatinine, Ser: 1.15 mg/dL (ref 0.61–1.24)
GFR calc non Af Amer: >60 mL/min (ref >60)
GLUCOSE: 179 mg/dL — AB (ref 65–99)
Potassium: 4.5 mmol/L (ref 3.5–5.1)
SODIUM: 139 mmol/L (ref 135–145)
Total Bilirubin: 0.9 mg/dL (ref 0.3–1.2)
Total Protein: 6.1 g/dL — ABNORMAL LOW (ref 6.5–8.1)

## 2016-07-13 LAB — I-STAT CG4 LACTIC ACID, ED
LACTIC ACID, VENOUS: 2.55 mmol/L — AB (ref 0.5–1.9)
Lactic Acid, Venous: 2.77 mmol/L (ref 0.5–1.9)

## 2016-07-13 LAB — I-STAT TROPONIN, ED: Troponin i, poc: 0.01 ng/mL (ref 0.00–0.08)

## 2016-07-13 LAB — BRAIN NATRIURETIC PEPTIDE: B Natriuretic Peptide: 213.7 pg/mL — ABNORMAL HIGH (ref 0.0–100.0)

## 2016-07-13 MED ORDER — IPRATROPIUM-ALBUTEROL 0.5-2.5 (3) MG/3ML IN SOLN
3.0000 mL | RESPIRATORY_TRACT | Status: DC | PRN
Start: 2016-07-13 — End: 2016-07-16
  Administered 2016-07-13: 3 mL via RESPIRATORY_TRACT

## 2016-07-13 MED ORDER — HYDRALAZINE HCL 20 MG/ML IJ SOLN: 5.0000 mg | INTRAMUSCULAR | Status: DC | PRN

## 2016-07-13 MED ORDER — PREDNISONE 10 MG PO TABS
60.0000 mg | ORAL_TABLET | Freq: Every day | ORAL | Status: DC
  Administered 2016-07-13: 19:00:00 60 mg via ORAL
  Filled 2016-07-13: qty 1

## 2016-07-13 MED ORDER — IPRATROPIUM-ALBUTEROL 0.5-2.5 (3) MG/3ML IN SOLN
3.0000 mL | RESPIRATORY_TRACT | Status: DC
  Administered 2016-07-14 (×2): 3 mL via RESPIRATORY_TRACT
  Filled 2016-07-13 (×3): qty 3

## 2016-07-13 MED ORDER — PANTOPRAZOLE SODIUM 40 MG PO TBEC
40.0000 mg | DELAYED_RELEASE_TABLET | Freq: Every day | ORAL | Status: DC
  Administered 2016-07-13 – 2016-07-16 (×4): 40 mg via ORAL
  Filled 2016-07-13 (×4): qty 1

## 2016-07-13 MED ORDER — ACETAMINOPHEN 650 MG RE SUPP: 650.0000 mg | Freq: Four times a day (QID) | RECTAL | Status: DC | PRN

## 2016-07-13 MED ORDER — SODIUM CHLORIDE 0.9 % IV BOLUS (SEPSIS)
1000.0000 mL | Freq: Once | INTRAVENOUS | Status: AC
  Administered 2016-07-13: 1000 mL via INTRAVENOUS

## 2016-07-13 MED ORDER — DEXTROSE 5 % IV SOLN
1.0000 g | INTRAVENOUS | Status: DC
  Administered 2016-07-13 – 2016-07-16 (×4): 1 g via INTRAVENOUS
  Filled 2016-07-13 (×4): qty 10

## 2016-07-13 MED ORDER — ENOXAPARIN SODIUM 40 MG/0.4ML ~~LOC~~ SOLN
40.0000 mg | SUBCUTANEOUS | Status: DC
  Administered 2016-07-13 – 2016-07-15 (×3): 40 mg via SUBCUTANEOUS
  Filled 2016-07-13 (×3): qty 0.4

## 2016-07-13 MED ORDER — CLOTRIMAZOLE 1 % EX CREA
TOPICAL_CREAM | Freq: Two times a day (BID) | CUTANEOUS | Status: DC | PRN
  Administered 2016-07-15: 16:00:00 via TOPICAL
  Filled 2016-07-13 (×2): qty 15

## 2016-07-13 MED ORDER — SODIUM CHLORIDE 0.9 % IV SOLN: 250.0000 mL | INTRAVENOUS | Status: DC | PRN

## 2016-07-13 MED ORDER — DEXTROSE 5 % IV SOLN: 1.0000 g | Freq: Once | INTRAVENOUS | Status: DC

## 2016-07-13 MED ORDER — ONDANSETRON HCL 4 MG PO TABS
4.0000 mg | ORAL_TABLET | Freq: Four times a day (QID) | ORAL | Status: DC | PRN
Start: 2016-07-13 — End: 2016-07-16

## 2016-07-13 MED ORDER — MOMETASONE FURO-FORMOTEROL FUM 200-5 MCG/ACT IN AERO
2.0000 | INHALATION_SPRAY | Freq: Two times a day (BID) | RESPIRATORY_TRACT | Status: DC
  Administered 2016-07-13 – 2016-07-16 (×6): 2 via RESPIRATORY_TRACT
  Filled 2016-07-13: qty 8.8

## 2016-07-13 MED ORDER — ONDANSETRON HCL 4 MG/2ML IJ SOLN
4.0000 mg | Freq: Four times a day (QID) | INTRAMUSCULAR | Status: DC | PRN
  Administered 2016-07-13: 4 mg via INTRAVENOUS
  Filled 2016-07-13: qty 2

## 2016-07-13 MED ORDER — ACETAMINOPHEN 325 MG PO TABS
650.0000 mg | ORAL_TABLET | Freq: Four times a day (QID) | ORAL | Status: DC | PRN
  Administered 2016-07-13: 650 mg via ORAL
  Filled 2016-07-13: qty 2

## 2016-07-13 MED ORDER — COQ10 100 MG PO CAPS: 1.0000 | ORAL_CAPSULE | Freq: Every day | ORAL | Status: DC

## 2016-07-13 MED ORDER — DEXTROSE 5 % IV SOLN: 500.0000 mg | Freq: Once | INTRAVENOUS | Status: DC

## 2016-07-13 MED ORDER — GUAIFENESIN ER 600 MG PO TB12
1200.0000 mg | ORAL_TABLET | Freq: Two times a day (BID) | ORAL | Status: DC
  Administered 2016-07-13 – 2016-07-16 (×6): 1200 mg via ORAL
  Filled 2016-07-13 (×6): qty 2

## 2016-07-13 MED ORDER — ALPRAZOLAM 0.5 MG PO TABS
0.5000 mg | ORAL_TABLET | Freq: Three times a day (TID) | ORAL | Status: DC | PRN
  Administered 2016-07-13 – 2016-07-15 (×6): 0.5 mg via ORAL
  Filled 2016-07-13 (×6): qty 1

## 2016-07-13 MED ORDER — ATORVASTATIN CALCIUM 40 MG PO TABS
40.0000 mg | ORAL_TABLET | Freq: Every day | ORAL | Status: DC
Start: 2016-07-13 — End: 2016-07-16
  Administered 2016-07-14 – 2016-07-15 (×2): 40 mg via ORAL
  Filled 2016-07-13 (×2): qty 1

## 2016-07-13 MED ORDER — ONDANSETRON HCL 4 MG/2ML IJ SOLN
4.0000 mg | Freq: Once | INTRAMUSCULAR | Status: AC
  Administered 2016-07-13: 4 mg via INTRAVENOUS
  Filled 2016-07-13: qty 2

## 2016-07-13 MED ORDER — BUPROPION HCL ER (SR) 150 MG PO TB12
150.0000 mg | ORAL_TABLET | Freq: Two times a day (BID) | ORAL | Status: DC
  Administered 2016-07-13 – 2016-07-16 (×6): 150 mg via ORAL
  Filled 2016-07-13 (×6): qty 1

## 2016-07-13 MED ORDER — IPRATROPIUM-ALBUTEROL 0.5-2.5 (3) MG/3ML IN SOLN
3.0000 mL | Freq: Once | RESPIRATORY_TRACT | Status: AC
  Administered 2016-07-13: 3 mL via RESPIRATORY_TRACT
  Filled 2016-07-13: qty 3

## 2016-07-13 MED ORDER — DEXTROSE 5 % IV SOLN
500.0000 mg | INTRAVENOUS | Status: DC
  Administered 2016-07-13: 500 mg via INTRAVENOUS
  Filled 2016-07-13: qty 500

## 2016-07-13 MED ORDER — SODIUM CHLORIDE 0.45 % IV SOLN
INTRAVENOUS | Status: DC
Start: 2016-07-13 — End: 2016-07-14
  Administered 2016-07-13: 50 mL/h via INTRAVENOUS

## 2016-07-13 MED ORDER — MONTELUKAST SODIUM 10 MG PO TABS
10.0000 mg | ORAL_TABLET | Freq: Every day | ORAL | Status: DC
Start: 2016-07-13 — End: 2016-07-16
  Administered 2016-07-13 – 2016-07-15 (×3): 10 mg via ORAL
  Filled 2016-07-13 (×3): qty 1

## 2016-07-13 MED ORDER — SODIUM CHLORIDE 0.9% FLUSH: 3.0000 mL | INTRAVENOUS | Status: DC | PRN

## 2016-07-13 MED ORDER — SODIUM CHLORIDE 0.9% FLUSH
3.0000 mL | Freq: Two times a day (BID) | INTRAVENOUS | Status: DC
  Administered 2016-07-13 – 2016-07-15 (×6): 3 mL via INTRAVENOUS

## 2016-07-13 MED ORDER — CLOPIDOGREL BISULFATE 75 MG PO TABS
75.0000 mg | ORAL_TABLET | Freq: Every day | ORAL | Status: DC
  Administered 2016-07-13 – 2016-07-16 (×4): 75 mg via ORAL
  Filled 2016-07-13 (×4): qty 1

## 2016-07-13 NOTE — Progress Notes (Addendum)
Family has questions about patients medication, called pharmacy to confirm medications. Patient doesn't want to take prescribed Lipitor, will refuse future doses.

## 2016-07-13 NOTE — ED Notes (Signed)
Pt returns from xray. Wife at bedside

## 2016-07-13 NOTE — ED Notes (Signed)
Pt stands at bedside to void to urinal for 2 00 cc . Assist required.

## 2016-07-13 NOTE — Progress Notes (Signed)
Patient admitted to floor from ED. Patient complains of nausea and headache. Temp 101.6. Tylenol and zofran given. Wife requesting that MD needs to come see the patient again. Patient is in no acute distress. Patient refusing lab work at this time. Education given to patient and family. MD notified; no new interventions needed.

## 2016-07-13 NOTE — ED Provider Notes (Signed)
[facility] DEPT Provider Note   CSN: [id number] Arrival date & time: 07/13/16  0903     History   Chief Complaint Chief Complaint  Patient presents with  . Fever  . Shortness of Breath    HPI Larry Stone is a 66 y.o. male.  HPI   Developed cough, congestion, dyspnea, wheezing and fever.  Reports nonproductive cough for 3 days, worsening last night with fever to 102.  Reports history of similar episodes, reports becomes ill with respiratory infections.  Reports some improvement with nebulizers at home however worsening symptoms overnight.  O2 sats high 80s with EMS, placed on nasal cannula with EMS and given 1L of NS.   Past Medical History:  Diagnosis Date  . Anxiety   . CAD    --8/09: ETT normal --acute inferior lateral wall infarction in September 2004 treated medically.  --cardiac CT 2006  residual 50% left main stenosis and nonobstructive disease elsewhere. EF normal   --cardiac CT 05/2009. LM. <50% LAD. 50%  . COPD (chronic obstructive pulmonary disease) ([place])   . Diabetes mellitus   . Diverticulosis   . DJD (degenerative joint disease)   . GERD (gastroesophageal reflux disease)   . HTN (hypertension)   . Hyperlipidemia   . Low back pain syndrome   . Myocardial infarction 2004  . OSA (obstructive sleep apnea)   . Pharyngitis   . Recurrent aspiration bronchitis/pneumonia ([place])   . Recurrent aspiration pneumonia ([place])    [name] 07/13/2016  . Tubular adenoma of colon 2014  . URI (upper respiratory infection)     Patient Active Problem List   Diagnosis Date Noted  . Sepsis ([place]) 07/13/2016  . Depression with anxiety 07/13/2016  . Acute respiratory failure with hypoxia ([place]) 07/13/2016  . Chronic diastolic CHF (congestive heart failure) ([place]) 07/13/2016  . Sepsis, unspecified organism ([place]) 07/13/2016  . Arthralgia 06/30/2016  . Fatigue 06/30/2016  . Creatinine elevation 06/30/2016  . Dyspnea on exertion   . COPD with acute exacerbation ([place]) 04/04/2015    . Elevated IgE level 03/18/2014  . Well adult exam 03/05/2014  . COPD with chronic bronchitis ([place]) 12/13/2013  . Venous insufficiency 11/03/2011  . Edema 11/03/2011  . Memory disturbance 05/13/2011  . CAD (coronary artery disease) 09/17/2010  . 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 Incidental pulmonary nodule 06/17/2009  . COLONIC POLYPS 10/17/2007  . BRONCHITIS, RECURRENT 10/17/2007  . GERD (gastroesophageal reflux disease) 10/17/2007  . Diverticulosis of large intestine 10/17/2007  . DEGENERATIVE JOINT DISEASE 10/17/2007  . Hyperglycemia 10/17/2007  . Dyslipidemia 10/02/2007  . Anxiety state 10/02/2007  . Obstructive sleep apnea 10/02/2007  . Essential hypertension 10/02/2007  . LOW BACK PAIN SYNDROME 10/02/2007    Past Surgical History:  Procedure Laterality Date  . CARDIAC CATHETERIZATION    . LEFT HEART CATH AND CORONARY ANGIOGRAPHY N/A 06/17/2016   Procedure: Left Heart Cath and Coronary Angiography;  Surgeon: Burnell Blanks, MD;  [facility];  Service: Cardiovascular;  Laterality: N/A;  . UVULOPALATOPHARYNGOPLASTY     surgery for OSA       Home Medications    Prior to Admission medications   Medication Sig Start Date End Date Taking? Authorizing Provider  albuterol (PROVENTIL) (2.5 MG/3ML) 0.083% nebulizer solution Use one vial in the nebulizer every 4-6 hours if needed for cough or wheeze 06/14/16  Yes [name], MD  ALPRAZolam Duanne Moron) 0.5 MG tablet Take 1 tablet (0.5 mg total) by mouth 3 (three) times daily as needed. 03/30/16  Yes Aleksei Plotnikov V, MD  amLODipine (NORVASC) 5 MG tablet TAKE 1 TABLET BY MOUTH EVERY DAY 04/29/16  Yes Burnell Blanks, MD  ammonium lactate (LAC-HYDRIN) 12 % lotion Apply topically as directed. Patient taking differently: Apply 1 application topically daily as needed for dry skin.  09/11/13  Yes Aleksei Plotnikov V, MD  atorvastatin (LIPITOR) 40 MG tablet Take 1 tablet (40 mg total) by mouth daily.  04/29/16  Yes Burnell Blanks, MD  budesonide-formoterol (SYMBICORT) 160-4.5 MCG/ACT inhaler INHALE 2 PUFFS INTO THE LUNGS 2 (TWO) TIMES DAILY. 05/01/15  Yes Noralee Space, MD  buPROPion (WELLBUTRIN SR) 150 MG 12 hr tablet Take 1 tablet (150 mg total) by mouth 2 (two) times daily. 05/01/15  Yes Noralee Space, MD  carvedilol (COREG) 12.5 MG tablet TAKE 1 TABLET BY MOUTH TWICE A DAY WITH A MEAL 04/29/16  Yes Burnell Blanks, MD  cetirizine (ZYRTEC) 10 MG tablet Take 1 tablet (10 mg total) by mouth daily. 05/13/11  Yes Noralee Space, MD  Cholecalciferol (VITAMIN D3) 5000 UNITS CAPS Take one tablet by mouth daily   Yes Historical Provider, MD  clopidogrel (PLAVIX) 75 MG tablet TAKE 1 TABLET (75 MG TOTAL) BY MOUTH DAILY. 06/11/16  Yes Burnell Blanks, MD  clotrimazole-betamethasone (LOTRISONE) cream APPLY AS DIRECTED Patient taking differently: Apply 1 application topically 2 (two) times daily as needed. APPLY AS DIRECTED 09/11/13  Yes Aleksei Plotnikov V, MD  Coenzyme Q10 (COQ10) 100 MG CAPS Take as directed Patient taking differently: Take 1 capsule by mouth daily. Take as directed 05/13/11  Yes Noralee Space, MD  cyanocobalamin 1000 MCG tablet Take 1,000 mcg by mouth daily.   Yes Historical Provider, MD  furosemide (LASIX) 40 MG tablet TAKE 1 TABLET BY MOUTH EVERY DAY AS NEEDED 06/14/16  Yes Burnell Blanks, MD  montelukast (SINGULAIR) 10 MG tablet TAKE 1 TABLET BY MOUTH AT BEDTIME 06/14/16  Yes [name], MD  nitroGLYCERIN (NITROSTAT) 0.4 MG SL tablet PLACE 1 TABLET (0.4 MG TOTAL) UNDER THE TONGUE EVERY 5 (FIVE) MINUTES AS NEEDED. FOR UP TO 3 DOSES. 06/14/16  Yes Burnell Blanks, MD  pantoprazole (PROTONIX) 40 MG tablet TAKE 1 TABLET BY MOUTH EVERY DAY 30 MINUTES BEFORE DINNERTIME 04/27/16  Yes Noralee Space, MD  VIAGRA 100 MG tablet TAKE 1 TABLET BY MOUTH AS NEEDED FOR ERECTILE DYSFUNCTION. 06/16/16  Yes Burnell Blanks, MD    Family History Family History    Problem Relation Age of Onset  . Colon cancer Father   . Stroke Mother   . Heart disease Mother   . Hypertension Sister   . Diabetes Sister   . Colon cancer Paternal Uncle     Social History Social History  Substance Use Topics  . Smoking status: Former Smoker    Packs/day: 1.00    Years: 20.00    Types: Cigarettes, Cigars    Quit date: 05/08/1998  . Smokeless tobacco: Never Used     Comment: quit in 2005  . Alcohol use 0.6 oz/week    1 Standard drinks or equivalent per week     Comment: social drinker     Allergies   Ace inhibitors; Angiotensin receptor blockers; Aspirin; Codeine; Irbesartan; and Ramipril   Review of Systems Review of Systems  Constitutional: Positive for fever.  HENT: Positive for congestion. Negative for sore throat.   Eyes: Negative for visual disturbance.  Respiratory: Positive for cough, shortness of breath and wheezing.   Cardiovascular:  Negative for chest pain.  Gastrointestinal: Positive for nausea. Negative for abdominal distention, abdominal pain and vomiting.  Genitourinary: Negative for difficulty urinating.  Musculoskeletal: Negative for back pain and neck stiffness.  Skin: Negative for rash.  Neurological: Negative for syncope and headaches.     Physical Exam Updated Vital Signs BP (!) 140/58 (BP Location: Left Arm)   Pulse 82   Temp 99.1 F (37.3 C) (Oral)   Resp 18   Ht 5\' 10"  (1.778 m)   Wt 220 lb 14.4 oz (100.2 kg)   SpO2 96%   BMI 31.70 kg/m   Physical Exam  Constitutional: He is oriented to person, place, and time. He appears well-developed and well-nourished. No distress.  HENT:  Head: Normocephalic and atraumatic.  Eyes: Conjunctivae and EOM are normal.  Neck: Normal range of motion.  Cardiovascular: Normal rate, regular rhythm, normal heart sounds and intact distal pulses.  Exam reveals no gallop and no friction rub.   No murmur heard. Pulmonary/Chest: Effort normal. Tachypnea (mild) noted. No respiratory  distress. He has wheezes (end expiratory). He has no rales.  Abdominal: Soft. He exhibits no distension. There is no tenderness. There is no guarding.  Musculoskeletal: He exhibits no edema.  Neurological: He is alert and oriented to person, place, and time.  Skin: Skin is warm and dry. He is not diaphoretic.  Nursing note and vitals reviewed.    ED Treatments / Results  Labs (all labs ordered are listed, but only abnormal results are displayed) Labs Reviewed  COMPREHENSIVE METABOLIC PANEL - Abnormal; Notable for the following:       Result Value   Glucose, Bld 179 (*)    Calcium 8.4 (*)    Total Protein 6.1 (*)    All other components within normal limits  CBC WITH DIFFERENTIAL/PLATELET - Abnormal; Notable for the following:    WBC 14.5 (*)    RBC 4.10 (*)    Hemoglobin 12.9 (*)    HCT 38.6 (*)    Platelets 136 (*)    Neutro Abs 12.7 (*)    Lymphs Abs 0.3 (*)    Monocytes Absolute 1.4 (*)    All other components within normal limits  URINALYSIS, ROUTINE W REFLEX MICROSCOPIC - Abnormal; Notable for the following:    Ketones, ur 5 (*)    All other components within normal limits  BRAIN NATRIURETIC PEPTIDE - Abnormal; Notable for the following:    B Natriuretic Peptide 213.7 (*)    All other components within normal limits  I-STAT CG4 LACTIC ACID, ED - Abnormal; Notable for the following:    Lactic Acid, Venous 2.77 (*)    All other components within normal limits  I-STAT CG4 LACTIC ACID, ED - Abnormal; Notable for the following:    Lactic Acid, Venous 2.55 (*)    All other components within normal limits  CULTURE, BLOOD (ROUTINE X 2)  CULTURE, BLOOD (ROUTINE X 2)  CULTURE, EXPECTORATED SPUTUM-ASSESSMENT  GRAM STAIN  RESPIRATORY PANEL BY PCR  STREP PNEUMONIAE URINARY ANTIGEN  LEGIONELLA PNEUMOPHILA SEROGP 1 UR AG  CBC  BASIC METABOLIC PANEL  HIV ANTIBODY (ROUTINE TESTING)  PROCALCITONIN  LACTIC ACID, PLASMA  I-STAT TROPOININ, ED    EKG  EKG  Interpretation None       Radiology Dg Chest 2 View  Result Date: 07/13/2016 CLINICAL DATA:  66 year old male with shortness of breath, fever nonproductive cough for the past 3 days, worsening today. EXAM: CHEST  2 VIEW COMPARISON:  Multiple priors, most recently  08/12/2015. FINDINGS: Lung volumes are low. Mild diffuse peribronchial cuffing. No consolidative airspace disease. No pleural effusions. No pneumothorax. No pulmonary nodule or mass noted. Pulmonary vasculature and the cardiomediastinal silhouette are within normal limits. Aortic atherosclerosis. IMPRESSION: 1. Mild diffuse peribronchial cuffing, concerning for mild bronchitis. 2. Aortic atherosclerosis. Electronically Signed   By: Vinnie Langton M.D.   On: 07/13/2016 10:11    Procedures Procedures (including critical care time)  Medications Ordered in ED Medications  cefTRIAXone (ROCEPHIN) 1 g in dextrose 5 % 50 mL IVPB (0 g Intravenous Stopped 07/13/16 1050)  ipratropium-albuterol (DUONEB) 0.5-2.5 (3) MG/3ML nebulizer solution 3 mL (0 mLs Nebulization Hold 07/13/16 2135)  ipratropium-albuterol (DUONEB) 0.5-2.5 (3) MG/3ML nebulizer solution 3 mL (3 mLs Nebulization Given 07/13/16 1834)  montelukast (SINGULAIR) tablet 10 mg (10 mg Oral Given 07/13/16 2118)  clopidogrel (PLAVIX) tablet 75 mg (75 mg Oral Given 07/13/16 1833)  atorvastatin (LIPITOR) tablet 40 mg (40 mg Oral Not Given 07/13/16 1815)  pantoprazole (PROTONIX) EC tablet 40 mg (40 mg Oral Given 07/13/16 1833)  ALPRAZolam (XANAX) tablet 0.5 mg (0.5 mg Oral Given 07/13/16 1833)  mometasone-formoterol (DULERA) 200-5 MCG/ACT inhaler 2 puff (2 puffs Inhalation Given 07/13/16 2118)  buPROPion (WELLBUTRIN SR) 12 hr tablet 150 mg (150 mg Oral Given 07/13/16 2118)  clotrimazole (LOTRIMIN) 1 % cream (not administered)  enoxaparin (LOVENOX) injection 40 mg (40 mg Subcutaneous Given 07/13/16 1833)  sodium chloride flush (NS) 0.9 % injection 3 mL (3 mLs Intravenous Given 07/13/16 2121)   sodium chloride flush (NS) 0.9 % injection 3 mL (not administered)  0.9 %  sodium chloride infusion (not administered)  0.45 % sodium chloride infusion ( Intravenous Transfusing/Transfer 07/13/16 1623)  acetaminophen (TYLENOL) tablet 650 mg (650 mg Oral Given 07/13/16 1658)    Or  acetaminophen (TYLENOL) suppository 650 mg ( Rectal See Alternative 07/13/16 1658)  ondansetron (ZOFRAN) tablet 4 mg ( Oral See Alternative 07/13/16 1658)    Or  ondansetron (ZOFRAN) injection 4 mg (4 mg Intravenous Given 07/13/16 1658)  guaiFENesin (MUCINEX) 12 hr tablet 1,200 mg (1,200 mg Oral Given 07/13/16 1832)  predniSONE (DELTASONE) tablet 60 mg (60 mg Oral Given 07/13/16 1832)  hydrALAZINE (APRESOLINE) injection 5-10 mg (not administered)  sodium chloride 0.9 % bolus 1,000 mL (0 mLs Intravenous Stopped 07/13/16 1136)  sodium chloride 0.9 % bolus 1,000 mL (0 mLs Intravenous Stopped 07/13/16 1339)  ipratropium-albuterol (DUONEB) 0.5-2.5 (3) MG/3ML nebulizer solution 3 mL (3 mLs Nebulization Given 07/13/16 1136)  ondansetron (ZOFRAN) injection 4 mg (4 mg Intravenous Given 07/13/16 1215)  ipratropium-albuterol (DUONEB) 0.5-2.5 (3) MG/3ML nebulizer solution 3 mL (3 mLs Nebulization Given 07/13/16 1228)   CRITICAL CARE:Sepsis, 2antibiotics, 3L fluid, elevated lactic acid Performed by: Alvino Chapel   Total critical care time: 30 minutes  Critical care time was exclusive of separately billable procedures and treating other patients.  Critical care was necessary to treat or prevent imminent or life-threatening deterioration.  Critical care was time spent personally by me on the following activities: development of treatment plan with patient and/or surrogate as well as nursing, discussions with consultants, evaluation of patient's response to treatment, examination of patient, obtaining history from patient or surrogate, ordering and performing treatments and interventions, ordering and review of laboratory  studies, ordering and review of radiographic studies, pulse oximetry and re-evaluation of patient's condition.    Initial Impression / Assessment and Plan / ED Course  I have reviewed the triage vital signs and the nursing notes.  Pertinent labs & imaging results  that were available during my care of the patient were reviewed by me and considered in my medical decision making (see chart for details).     66yo male with hx of CAD, DM, htn, hpld, recurrent bronchitis/pneumonia, COPD, presents with concern for cough, congestion, fever and dyspnea.  XR does not show clear pneumonia, however symptoms concerning for pneumonia vs bacterial bronchitis, vs viral syndrome and COPD exacerbation.  Received 1L NS with EMS< given second liter on arrival, 3rd liter when lactic acid elevated. Given rocephin/azithromycin for clinical concern for pneumonia. Duonebs for COPD.  Influenza pending. Will admit for further care.   Final Clinical Impressions(s) / ED Diagnoses   Final diagnoses:  Community acquired pneumonia, unspecified laterality  Sepsis, due to unspecified organism [facility])  COPD exacerbation [facility])    New Prescriptions Current Discharge Medication List       Gareth Morgan, MD 07/13/16 2216

## 2016-07-13 NOTE — ED Triage Notes (Addendum)
Pt arrives [facility] from home with c/o shortness of breath and fever with non productive3 cough. Symptom started 3 days ago and worse today.Pt received 1 l ns by EMS pta.

## 2016-07-13 NOTE — Progress Notes (Signed)
Admissions nurse notified.

## 2016-07-13 NOTE — Progress Notes (Signed)
Pharmacy Antibiotic Note  Larry Stone is a 66 y.o. male admitted on 07/13/2016 with pneumonia.  Pharmacy has been consulted for ceftriaxone/azithromycin dosing. Afebrile.  Plan: Ceftriaxone 1g IV q24h Azithromycin 500mg  IV q24h F/u clinical progress, LOT Not renally adjusted - Rx will sign off consults  Height: 5\' 9"  (175.3 cm) Weight: 210 lb (95.3 kg) IBW/kg (Calculated) : 70.7  Temp (24hrs), Avg:99 F (37.2 C), Min:99 F (37.2 C), Max:99 F (37.2 C)  No results for input(s): WBC, CREATININE, LATICACIDVEN, VANCOTROUGH, VANCOPEAK, VANCORANDOM, GENTTROUGH, GENTPEAK, GENTRANDOM, TOBRATROUGH, TOBRAPEAK, TOBRARND, AMIKACINPEAK, AMIKACINTROU, AMIKACIN in the last 168 hours.  Estimated Creatinine Clearance: 75.2 mL/min (by C-G formula based on SCr of 1.1 mg/dL).    Allergies  Allergen Reactions  . Ace Inhibitors Swelling  . Angiotensin Receptor Blockers Swelling  . Aspirin Hives  . Codeine Nausea And Vomiting  . Irbesartan Other (See Comments)    REACTION: allergic to ARBs w/ angioedema  . Ramipril Other (See Comments)    REACTION: Allergic to ACE's w/ angioedema...    Elicia Lamp, PharmD, BCPS Clinical Pharmacist 07/13/2016 9:31 AM

## 2016-07-13 NOTE — H&P (Signed)
History and Physical    Larry Stone UXN:[id number] DOB: 11-02-1950 DOA: 07/13/2016  PCP: [name], MD Patient coming from: Home  Chief Complaint: Cough and SOB  HPI: [name] is a 66 y.o. male with medical history significant of CAD/MI, diabetes, GERD, hypertension, hyperlipidemia, OSA, recurrent aspiration pneumonia, presenting with 2 days of cough, wheezing and SOB. Cough is non-productive. No home O2. Febrile up to 103. Home nebulizers w/ limited improvement. Pt states that this typically happens every spring and fall. Home O2 saturations were 88% on patient's pulse ox and by EMS. Denies any chest pain, nausea, vomiting, abdominal pain, dysuria, frequency, diarrhea, medication, melena, neck stiffness, LOC, dizziness, vertigo.  Very little oral intake over the last 2 days especially with regards to fluids.  ED Course: Sepsis protocol initiated and patient received 3 L normal saline bolus in addition to vancomycin and Levaquin.  Review of Systems: As per HPI otherwise 10 point review of systems negative.   Ambulatory Status: No restrictions at baseline.  Past Medical History:  Diagnosis Date  . Anxiety   . CAD    --8/09: ETT normal --acute inferior lateral wall infarction in September 2004 treated medically.  --cardiac CT 2006  residual 50% left main stenosis and nonobstructive disease elsewhere. EF normal   --cardiac CT 05/2009. LM. <50% LAD. 50%  . COPD (chronic obstructive pulmonary disease) ([place])   . Diabetes mellitus   . Diverticulosis   . DJD (degenerative joint disease)   . GERD (gastroesophageal reflux disease)   . HTN (hypertension)   . Hyperlipidemia   . Low back pain syndrome   . Myocardial infarction 2004  . OSA (obstructive sleep apnea)   . Pharyngitis   . Recurrent aspiration bronchitis/pneumonia ([place])   . Tubular adenoma of colon 2014  . URI (upper respiratory infection)     Past Surgical History:  Procedure Laterality Date  . CARDIAC  CATHETERIZATION    . LEFT HEART CATH AND CORONARY ANGIOGRAPHY N/A 06/17/2016   Procedure: Left Heart Cath and Coronary Angiography;  Surgeon: Burnell Blanks, MD;  Location: [facility];  Service: Cardiovascular;  Laterality: N/A;  . UVULOPALATOPHARYNGOPLASTY     surgery for OSA    Social History   Social History  . Marital status: Married    Spouse name: N/A  . Number of children: 1  . Years of education: N/A   Occupational History  . [employer] [place] History Main Topics  . Smoking status: Former Smoker    Packs/day: 1.00    Years: 20.00    Types: Cigarettes, Cigars    Quit date: 05/08/1998  . Smokeless tobacco: Never Used     Comment: quit in 2005  . Alcohol use 0.6 oz/week    1 Standard drinks or equivalent per week     Comment: social drinker  . Drug use: No  . Sexual activity: Yes   Other Topics Concern  . Not on file   Social History Narrative  . No narrative on file    Allergies  Allergen Reactions  . Ace Inhibitors Swelling  . Angiotensin Receptor Blockers Swelling  . Aspirin Hives  . Codeine Nausea And Vomiting  . Irbesartan Other (See Comments)    REACTION: allergic to ARBs w/ angioedema  . Ramipril Other (See Comments)    REACTION: Allergic to ACE's w/ angioedema...    Family History  Problem Relation Age of Onset  . Colon cancer Father   .  Stroke Mother   . Heart disease Mother   . Hypertension Sister   . Diabetes Sister   . Colon cancer Paternal Uncle     Prior to Admission medications   Medication Sig Start Date End Date Taking? Authorizing Provider  albuterol (PROVENTIL) (2.5 MG/3ML) 0.083% nebulizer solution Use one vial in the nebulizer every 4-6 hours if needed for cough or wheeze 06/14/16  Yes [name], MD  ALPRAZolam Duanne Moron) 0.5 MG tablet Take 1 tablet (0.5 mg total) by mouth 3 (three) times daily as needed. 03/30/16  Yes Aleksei Plotnikov V, MD  amLODipine (NORVASC) 5 MG tablet TAKE 1  TABLET BY MOUTH EVERY DAY 04/29/16  Yes Burnell Blanks, MD  ammonium lactate (LAC-HYDRIN) 12 % lotion Apply topically as directed. Patient taking differently: Apply 1 application topically daily as needed for dry skin.  09/11/13  Yes Aleksei Plotnikov V, MD  atorvastatin (LIPITOR) 40 MG tablet Take 1 tablet (40 mg total) by mouth daily. 04/29/16  Yes Burnell Blanks, MD  budesonide-formoterol (SYMBICORT) 160-4.5 MCG/ACT inhaler INHALE 2 PUFFS INTO THE LUNGS 2 (TWO) TIMES DAILY. 05/01/15  Yes Noralee Space, MD  buPROPion (WELLBUTRIN SR) 150 MG 12 hr tablet Take 1 tablet (150 mg total) by mouth 2 (two) times daily. 05/01/15  Yes Noralee Space, MD  carvedilol (COREG) 12.5 MG tablet TAKE 1 TABLET BY MOUTH TWICE A DAY WITH A MEAL 04/29/16  Yes Burnell Blanks, MD  cetirizine (ZYRTEC) 10 MG tablet Take 1 tablet (10 mg total) by mouth daily. 05/13/11  Yes Noralee Space, MD  Cholecalciferol (VITAMIN D3) 5000 UNITS CAPS Take one tablet by mouth daily   Yes Historical Provider, MD  clopidogrel (PLAVIX) 75 MG tablet TAKE 1 TABLET (75 MG TOTAL) BY MOUTH DAILY. 06/11/16  Yes Burnell Blanks, MD  clotrimazole-betamethasone (LOTRISONE) cream APPLY AS DIRECTED Patient taking differently: Apply 1 application topically 2 (two) times daily as needed. APPLY AS DIRECTED 09/11/13  Yes Aleksei Plotnikov V, MD  Coenzyme Q10 (COQ10) 100 MG CAPS Take as directed Patient taking differently: Take 1 capsule by mouth daily. Take as directed 05/13/11  Yes Noralee Space, MD  cyanocobalamin 1000 MCG tablet Take 1,000 mcg by mouth daily.   Yes Historical Provider, MD  furosemide (LASIX) 40 MG tablet TAKE 1 TABLET BY MOUTH EVERY DAY AS NEEDED 06/14/16  Yes Burnell Blanks, MD  montelukast (SINGULAIR) 10 MG tablet TAKE 1 TABLET BY MOUTH AT BEDTIME 06/14/16  Yes [name], MD  nitroGLYCERIN (NITROSTAT) 0.4 MG SL tablet PLACE 1 TABLET (0.4 MG TOTAL) UNDER THE TONGUE EVERY 5 (FIVE) MINUTES AS NEEDED. FOR UP TO  3 DOSES. 06/14/16  Yes Burnell Blanks, MD  pantoprazole (PROTONIX) 40 MG tablet TAKE 1 TABLET BY MOUTH EVERY DAY 30 MINUTES BEFORE DINNERTIME 04/27/16  Yes Noralee Space, MD  VIAGRA 100 MG tablet TAKE 1 TABLET BY MOUTH AS NEEDED FOR ERECTILE DYSFUNCTION. 06/16/16  Yes Burnell Blanks, MD    Physical Exam: Vitals:   07/13/16 1100 07/13/16 1115 07/13/16 1145 07/13/16 1200  BP: 119/63 120/65 (!) 106/46 (!) 106/52  Pulse: 86 85 82 77  Resp: (!) 28 (!) 25 (!) 27 (!) 24  Temp:      TempSrc:      SpO2: 97% 98% 97% 96%  Weight:      Height:         General: Mild distress, resting in bed comfortably. Eyes:  PERRL, EOMI, normal lids,  iris ENT:  grossly normal hearing, lips & tongue, mmm Neck:  no LAD, masses or thyromegaly Cardiovascular:  RRR, no m/r/g. No LE edema.  Respiratory: Diminished breath sounds throughout. Wheezing throughout. Increased effort. Initially on 5 L nasal cannula with O2 saturations in the high 90s. Abdomen:  soft, ntnd, NABS Skin:  no rash or induration seen on limited exam Musculoskeletal:  grossly normal tone BUE/BLE, good ROM, no bony abnormality Psychiatric:  grossly normal mood and affect, speech fluent and appropriate, AOx3 Neurologic:  CN 2-12 grossly intact, moves all extremities in coordinated fashion, sensation intact  Labs on Admission: I have personally reviewed following labs and imaging studies  CBC:  Recent Labs Lab 07/13/16 0933  WBC 14.5*  NEUTROABS 12.7*  HGB 12.9*  HCT 38.6*  MCV 94.1  PLT 962*   Basic Metabolic Panel:  Recent Labs Lab 07/13/16 0933  NA 139  K 4.5  CL 104  CO2 25  GLUCOSE 179*  BUN 15  CREATININE 1.15  CALCIUM 8.4*   GFR: Estimated Creatinine Clearance: 71.9 mL/min (by C-G formula based on SCr of 1.15 mg/dL). Liver Function Tests:  Recent Labs Lab 07/13/16 0933  AST 26  ALT 25  ALKPHOS 91  BILITOT 0.9  PROT 6.1*  ALBUMIN 3.5   No results for input(s): LIPASE, AMYLASE in the last 168  hours. No results for input(s): AMMONIA in the last 168 hours. Coagulation Profile: No results for input(s): INR, PROTIME in the last 168 hours. Cardiac Enzymes: No results for input(s): CKTOTAL, CKMB, CKMBINDEX, TROPONINI in the last 168 hours. BNP (last 3 results)  Recent Labs  08/12/15 1326  PROBNP 35.0   HbA1C: No results for input(s): HGBA1C in the last 72 hours. CBG: No results for input(s): GLUCAP in the last 168 hours. Lipid Profile: No results for input(s): CHOL, HDL, LDLCALC, TRIG, CHOLHDL, LDLDIRECT in the last 72 hours. Thyroid Function Tests: No results for input(s): TSH, T4TOTAL, FREET4, T3FREE, THYROIDAB in the last 72 hours. Anemia Panel: No results for input(s): VITAMINB12, FOLATE, FERRITIN, TIBC, IRON, RETICCTPCT in the last 72 hours. Urine analysis:    Component Value Date/Time   COLORURINE YELLOW 07/13/2016 1137   APPEARANCEUR CLEAR 07/13/2016 1137   LABSPEC 1.023 07/13/2016 1137   PHURINE 5.0 07/13/2016 1137   GLUCOSEU NEGATIVE 07/13/2016 1137   GLUCOSEU NEGATIVE 03/30/2016 1056   HGBUR NEGATIVE 07/13/2016 1137   BILIRUBINUR NEGATIVE 07/13/2016 1137   KETONESUR 5 (A) 07/13/2016 1137   PROTEINUR NEGATIVE 07/13/2016 1137   UROBILINOGEN 0.2 03/30/2016 1056   NITRITE NEGATIVE 07/13/2016 1137   LEUKOCYTESUR NEGATIVE 07/13/2016 1137    Creatinine Clearance: Estimated Creatinine Clearance: 71.9 mL/min (by C-G formula based on SCr of 1.15 mg/dL).  Sepsis Labs: '@LABRCNTIP'$ (procalcitonin:4,lacticidven:4) )No results found for this or any previous visit (from the past 240 hour(s)).   Radiological Exams on Admission: Dg Chest 2 View  Result Date: 07/13/2016 CLINICAL DATA:  66 year old male with shortness of breath, fever nonproductive cough for the past 3 days, worsening today. EXAM: CHEST  2 VIEW COMPARISON:  Multiple priors, most recently 08/12/2015. FINDINGS: Lung volumes are low. Mild diffuse peribronchial cuffing. No consolidative airspace disease. No  pleural effusions. No pneumothorax. No pulmonary nodule or mass noted. Pulmonary vasculature and the cardiomediastinal silhouette are within normal limits. Aortic atherosclerosis. IMPRESSION: 1. Mild diffuse peribronchial cuffing, concerning for mild bronchitis. 2. Aortic atherosclerosis. Electronically Signed   By: Vinnie Langton M.D.   On: 07/13/2016 10:11    EKG: Independently reviewed.   Assessment/Plan  Active Problems:   Sepsis ([place])   Depression with anxiety   Acute respiratory failure with hypoxia ([facility])   Chronic diastolic CHF (congestive heart failure) ([facility])   Sepsis, unspecified organism ([place])   Acute Hypoxic Respiratory Failure:Likely from COPD exacerbation and possible CAP. Pt very dehydrated so CXR not overly impressive. O2 sats reported by EMS in the high 80s on RA. Pt initially met sepsis criteria likely only due to dehydration leading to elevated lactic acid so started on 30cc/kg IVF and ABX protocol. Initial lactic acid 2.77, troponin 0, BNP 213, WBC 14.5 with left shift, afebrile with marked increased respiratory effort and occasional hypo-tension.  - O2 PRN- - Narrow ABX to Levaquin alone - No further IV bolus - Procalcitonin, Strep/Legionella Ag, Respiratory viral panel - Duonebs, Mucinex - Contineu Dulera - Prednisone 60 Qday  HTN:  - Hold norvasc, coreg - IVF - Hydralazine PRN  CAD: h/o MI. Trop neg. EKG w/o ACS. No CP - tele - continue ASA, Lipitor, plavix  Depression/Anxiety: - continue wellbutrin, Xanax  Chronic diastolic congestive heart failure: Last echo showing EF of 55% and grade 1 diastolic dysfunction. No evidence of the conversation at this time. BNP 213. - I/O, Daily wts - Resume Lasix when pressure improves.   GERD: - continue ppi  DVT prophylaxis: lovenox  Code Status: full  Family Communication: wife  Disposition Plan: pending improvement in respiratory status  Consults called: none  Admission status: inpt    Robson Trickey J  MD [facility]  If 7PM-7AM, please contact night-coverage [email or web address] Password [email or web address]  07/13/2016, 1:30 PM

## 2016-07-14 ENCOUNTER — Inpatient Hospital Stay (HOSPITAL_COMMUNITY): Payer: Medicare Other

## 2016-07-14 ENCOUNTER — Ambulatory Visit: Payer: Medicare Other | Admitting: Pulmonary Disease

## 2016-07-14 DIAGNOSIS — J189 Pneumonia, unspecified organism: Secondary | ICD-10-CM

## 2016-07-14 LAB — PROCALCITONIN: PROCALCITONIN: 0.77 ng/mL

## 2016-07-14 LAB — RESPIRATORY PANEL BY PCR
Adenovirus: NOT DETECTED
BORDETELLA PERTUSSIS-RVPCR: NOT DETECTED
CORONAVIRUS 229E-RVPPCR: NOT DETECTED
CORONAVIRUS OC43-RVPPCR: NOT DETECTED
Chlamydophila pneumoniae: NOT DETECTED
Coronavirus HKU1: NOT DETECTED
Coronavirus NL63: NOT DETECTED
INFLUENZA B-RVPPCR: DETECTED — AB
Influenza A: NOT DETECTED
METAPNEUMOVIRUS-RVPPCR: NOT DETECTED
Mycoplasma pneumoniae: NOT DETECTED
PARAINFLUENZA VIRUS 1-RVPPCR: NOT DETECTED
PARAINFLUENZA VIRUS 2-RVPPCR: NOT DETECTED
PARAINFLUENZA VIRUS 3-RVPPCR: NOT DETECTED
PARAINFLUENZA VIRUS 4-RVPPCR: NOT DETECTED
RESPIRATORY SYNCYTIAL VIRUS-RVPPCR: NOT DETECTED
RHINOVIRUS / ENTEROVIRUS - RVPPCR: NOT DETECTED

## 2016-07-14 LAB — CBC
HEMATOCRIT: 37.6 % — AB (ref 39.0–52.0)
Hemoglobin: 12.2 g/dL — ABNORMAL LOW (ref 13.0–17.0)
MCH: 30.6 pg (ref 26.0–34.0)
MCHC: 32.4 g/dL (ref 30.0–36.0)
MCV: 94.2 fL (ref 78.0–100.0)
PLATELETS: 107 10*3/uL — AB (ref 150–400)
RBC: 3.99 MIL/uL — ABNORMAL LOW (ref 4.22–5.81)
RDW: 12.9 % (ref 11.5–15.5)
WBC: 10.2 10*3/uL (ref 4.0–10.5)

## 2016-07-14 LAB — BASIC METABOLIC PANEL
ANION GAP: 11 (ref 5–15)
BUN: 14 mg/dL (ref 6–20)
CHLORIDE: 99 mmol/L — AB (ref 101–111)
CO2: 25 mmol/L (ref 22–32)
Calcium: 8.4 mg/dL — ABNORMAL LOW (ref 8.9–10.3)
Creatinine, Ser: 1.05 mg/dL (ref 0.61–1.24)
GFR calc non Af Amer: >60 mL/min (ref >60)
Glucose, Bld: 124 mg/dL — ABNORMAL HIGH (ref 65–99)
POTASSIUM: 3.9 mmol/L (ref 3.5–5.1)
Sodium: 135 mmol/L (ref 135–145)

## 2016-07-14 LAB — HIV ANTIBODY (ROUTINE TESTING W REFLEX): HIV SCREEN 4TH GENERATION: NONREACTIVE

## 2016-07-14 LAB — LACTIC ACID, PLASMA: LACTIC ACID, VENOUS: 1.8 mmol/L (ref 0.5–1.9)

## 2016-07-14 LAB — STREP PNEUMONIAE URINARY ANTIGEN: Strep Pneumo Urinary Antigen: NEGATIVE

## 2016-07-14 MED ORDER — IPRATROPIUM-ALBUTEROL 0.5-2.5 (3) MG/3ML IN SOLN
3.0000 mL | Freq: Three times a day (TID) | RESPIRATORY_TRACT | Status: DC
  Administered 2016-07-14 – 2016-07-16 (×6): 3 mL via RESPIRATORY_TRACT
  Filled 2016-07-14 (×5): qty 3

## 2016-07-14 MED ORDER — METHYLPREDNISOLONE SODIUM SUCC 40 MG IJ SOLR
40.0000 mg | Freq: Two times a day (BID) | INTRAMUSCULAR | Status: DC
  Administered 2016-07-14 – 2016-07-16 (×5): 40 mg via INTRAVENOUS
  Filled 2016-07-14 (×5): qty 1

## 2016-07-14 MED ORDER — LORATADINE 10 MG PO TABS
10.0000 mg | ORAL_TABLET | Freq: Every day | ORAL | Status: DC
  Administered 2016-07-14 – 2016-07-16 (×3): 10 mg via ORAL
  Filled 2016-07-14 (×3): qty 1

## 2016-07-14 MED ORDER — AMLODIPINE BESYLATE 5 MG PO TABS
5.0000 mg | ORAL_TABLET | Freq: Every day | ORAL | Status: DC
  Administered 2016-07-14 – 2016-07-16 (×3): 5 mg via ORAL
  Filled 2016-07-14 (×3): qty 1

## 2016-07-14 MED ORDER — OSELTAMIVIR PHOSPHATE 75 MG PO CAPS
75.0000 mg | ORAL_CAPSULE | Freq: Two times a day (BID) | ORAL | Status: DC
  Administered 2016-07-14 – 2016-07-16 (×5): 75 mg via ORAL
  Filled 2016-07-14 (×5): qty 1

## 2016-07-14 MED ORDER — GUAIFENESIN-DM 100-10 MG/5ML PO SYRP: 5.0000 mL | ORAL_SOLUTION | ORAL | Status: DC | PRN

## 2016-07-14 MED ORDER — CARVEDILOL 12.5 MG PO TABS
12.5000 mg | ORAL_TABLET | Freq: Two times a day (BID) | ORAL | Status: DC
  Administered 2016-07-14: 12.5 mg via ORAL
  Filled 2016-07-14: qty 1

## 2016-07-14 NOTE — Progress Notes (Signed)
Patient has general malaise, temperature(previous fever) is going down. Patient says he feels too weak to stand and wasn't comfortable standing up on the scale this morning for daily weights.

## 2016-07-14 NOTE — Progress Notes (Signed)
Patient ID: Larry Stone, male   DOB: 06/29/1950, 66 y.o.   MRN: [id number]    PROGRESS NOTE  MICHELANGELO RINDFLEISCH  KFB:[id number] DOB: November 12, 1950 DOA: 07/13/2016  PCP: Sonda Primes, MD   Brief Narrative:  66 y.o. male with medical history significant of CAD/MI, diabetes, GERD, hypertension, hyperlipidemia, OSA, recurrent aspiration pneumonia, presenting with 2 days of cough, wheezing and SOB. Cough is non-productive. No home O2. Febrile up to 103. Home nebulizers w/ limited improvement. Pt states that this typically happens every spring and fall.   Assessment & Plan:   Acute hypoxic respiratory failure - Likely from COPD exacerbation and possible CAP vs bronchitis  - still requires oxygen via [place] - Procalcitonin, Strep/Legionella Ag, Respiratory viral panel - Duonebs, Mucinex - Contineu Dulera - change prednisone to Solumedrol as pt with wheezing and course breath sounds this am   Sepsis  - please note that pt has met criteria for sepsis on admission, WBC 14.5, T 101.5, HR up to 95, RR up to 24 - source suspected pulmonary: bronchitis vs PNA, viral  - no clear evidence of PNA on CXR - pt however with rhonchi at bases and persistent cough - will ask for CT chest for clearer evaluation - follow up on urine legionella and strep pneumo - continue Rocephin day #2 for now - WBC is trending down and is WNL this AM - lactic acid now cleared  - CBC in AM  HTN, essential - Hold norvasc, coreg on admission - can resume today  - would stop IVF for now to see how pt does   CAD - h/o MI. Trop neg. EKG w/o ACS. No CP - continue Lipitor, plavix  Depression/Anxiety - continue wellbutrin, Xanax  Thrombocytopenia  - monitor for now - CBC in AM  Chronic diastolic congestive heart failure - Last echo showing EF of 55% and grade 1 diastolic dysfunction. No evidence of the conversation at this time. BNP 213. - lasix held on admission and pt started on IVF - I will stop IVF - plan to  resume lasix in AM depending on resp status and BP  GERD - continue PPI  Obesity  - Body mass index is 31.7 kg/m.  DVT prophylaxis: Lovenox SQ Code Status: Full  Family Communication: Patient at bedside  Disposition Plan: likely home in 2-3 days   Consultants:   None   Procedures:   None  Antimicrobials:   Rocephin 3/27 -->  Subjective: Reports feeling better but still with dyspnea and muscle aches.   Objective: Vitals:   07/14/16 0032 07/14/16 0423 07/14/16 0842 07/14/16 0847  BP: (!) 147/65 (!) 148/65    Pulse: 80 83    Resp: 20 (!) 22    Temp: 99.1 F (37.3 C) 98.4 F (36.9 C)    TempSrc: Oral Oral    SpO2: 94% 98% 99% 100%  Weight:      Height:        Intake/Output Summary (Last 24 hours) at 07/14/16 1106 Last data filed at 07/14/16 0900  Gross per 24 hour  Intake             1751 ml  Output              600 ml  Net             1151 ml   Filed Weights   07/13/16 0922 07/13/16 1629  Weight: 95.3 kg (210 lb) 100.2 kg (220 lb 14.4 oz)    Examination:  General exam: Appears calm and comfortable  Respiratory system: Course breath sounds with rhonchi at bases, exp wheezing  Cardiovascular system: S1 & S2 heard, RRR. No rubs, gallops or clicks. No pedal edema. Gastrointestinal system: Abdomen is nondistended, soft and nontender. No organomegaly or masses felt. Central nervous system: Alert and oriented. No focal neurological deficits. Extremities: Symmetric 5 x 5 power.  Data Reviewed: I have personally reviewed following labs and imaging studies  CBC:  Recent Labs Lab 07/13/16 0933 07/14/16 0452  WBC 14.5* 10.2  NEUTROABS 12.7*  --   HGB 12.9* 12.2*  HCT 38.6* 37.6*  MCV 94.1 94.2  PLT 136* 659*   Basic Metabolic Panel:  Recent Labs Lab 07/13/16 0933 07/14/16 0452  NA 139 135  K 4.5 3.9  CL 104 99*  CO2 25 25  GLUCOSE 179* 124*  BUN 15 14  CREATININE 1.15 1.05  CALCIUM 8.4* 8.4*   Liver Function Tests:  Recent Labs Lab  07/13/16 0933  AST 26  ALT 25  ALKPHOS 91  BILITOT 0.9  PROT 6.1*  ALBUMIN 3.5   BNP (last 3 results)  Recent Labs  08/12/15 1326  PROBNP 35.0   Urine analysis:    Component Value Date/Time   COLORURINE YELLOW 07/13/2016 [place] 07/13/2016 1137   LABSPEC 1.023 07/13/2016 1137   PHURINE 5.0 07/13/2016 1137   GLUCOSEU NEGATIVE 07/13/2016 1137   GLUCOSEU NEGATIVE 03/30/2016 1056   HGBUR NEGATIVE 07/13/2016 1137   BILIRUBINUR NEGATIVE 07/13/2016 1137   KETONESUR 5 (A) 07/13/2016 1137   PROTEINUR NEGATIVE 07/13/2016 1137   UROBILINOGEN 0.2 03/30/2016 1056   NITRITE NEGATIVE 07/13/2016 1137   LEUKOCYTESUR NEGATIVE 07/13/2016 1137   Radiology Studies: Dg Chest 2 View  Result Date: 07/13/2016 CLINICAL DATA:  66 year old male with shortness of breath, fever nonproductive cough for the past 3 days, worsening today. EXAM: CHEST  2 VIEW COMPARISON:  Multiple priors, most recently 08/12/2015. FINDINGS: Lung volumes are low. Mild diffuse peribronchial cuffing. No consolidative airspace disease. No pleural effusions. No pneumothorax. No pulmonary nodule or mass noted. Pulmonary vasculature and the cardiomediastinal silhouette are within normal limits. Aortic atherosclerosis. IMPRESSION: 1. Mild diffuse peribronchial cuffing, concerning for mild bronchitis. 2. Aortic atherosclerosis. Electronically Signed   By: Vinnie Langton M.D.   On: 07/13/2016 10:11   Scheduled Meds: . atorvastatin  40 mg Oral q1800  . buPROPion  150 mg Oral BID  . cefTRIAXone (ROCEPHIN)  IV  1 g Intravenous Q24H  . clopidogrel  75 mg Oral Daily  . enoxaparin (LOVENOX) injection  40 mg Subcutaneous Q24H  . guaiFENesin  1,200 mg Oral BID  . ipratropium-albuterol  3 mL Nebulization TID  . methylPREDNISolone (SOLU-MEDROL) injection  40 mg Intravenous Q12H  . mometasone-formoterol  2 puff Inhalation BID  . montelukast  10 mg Oral QHS  . pantoprazole  40 mg Oral Daily  . sodium chloride flush  3  mL Intravenous Q12H   Continuous Infusions:   LOS: 1 day   Time spent: 20 minutes   Faye Ramsay, MD [facility] Pager [phone]  If 7PM-7AM, please contact night-coverage [email or web address] Password [facility] 07/14/2016, 11:06 AM

## 2016-07-15 LAB — CBC
HEMATOCRIT: 37.4 % — AB (ref 39.0–52.0)
HEMOGLOBIN: 12.4 g/dL — AB (ref 13.0–17.0)
MCH: 31.1 pg (ref 26.0–34.0)
MCHC: 33.2 g/dL (ref 30.0–36.0)
MCV: 93.7 fL (ref 78.0–100.0)
Platelets: 105 10*3/uL — ABNORMAL LOW (ref 150–400)
RBC: 3.99 MIL/uL — AB (ref 4.22–5.81)
RDW: 13 % (ref 11.5–15.5)
WBC: 8.7 10*3/uL (ref 4.0–10.5)

## 2016-07-15 LAB — BASIC METABOLIC PANEL
ANION GAP: 7 (ref 5–15)
BUN: 18 mg/dL (ref 6–20)
CHLORIDE: 101 mmol/L (ref 101–111)
CO2: 28 mmol/L (ref 22–32)
Calcium: 8.5 mg/dL — ABNORMAL LOW (ref 8.9–10.3)
Creatinine, Ser: 1 mg/dL (ref 0.61–1.24)
GFR calc Af Amer: >60 mL/min (ref >60)
GFR calc non Af Amer: >60 mL/min (ref >60)
GLUCOSE: 155 mg/dL — AB (ref 65–99)
POTASSIUM: 4.3 mmol/L (ref 3.5–5.1)
Sodium: 136 mmol/L (ref 135–145)

## 2016-07-15 LAB — LEGIONELLA PNEUMOPHILA SEROGP 1 UR AG: L. pneumophila Serogp 1 Ur Ag: NEGATIVE

## 2016-07-15 MED ORDER — CARVEDILOL 12.5 MG PO TABS
12.5000 mg | ORAL_TABLET | Freq: Two times a day (BID) | ORAL | Status: DC
  Administered 2016-07-15 – 2016-07-16 (×3): 12.5 mg via ORAL
  Filled 2016-07-15 (×3): qty 1

## 2016-07-15 NOTE — Progress Notes (Signed)
Patient ID: Larry Stone, male   DOB: [birth year], 66 y.o.   MRN: [id number]    PROGRESS NOTE  REAL CONA  EXH:[id number] DOB: 1950-12-10 DOA: 07/13/2016  PCP: [name], MD   Brief Narrative:  66 y.o. male with medical history significant of CAD/MI, diabetes, GERD, hypertension, hyperlipidemia, OSA, recurrent aspiration pneumonia, presenting with 2 days of cough, wheezing and SOB. Cough is non-productive. No home O2. Febrile up to 103. Home nebulizers w/ limited improvement. Pt states that this typically happens every spring and fall.   Assessment & Plan:   Acute hypoxic respiratory failure - Likely from COPD exacerbation and multilobar PNA, influenza B +, possible bacterial component  - pt reports feeling better this AM, able to take off oxygen  - Duonebs, Mucinex - Contineu Dulera - keep on solumedrol one more day as pt is still wheezing, plan to change to PO Prednisone in AM   Sepsis  - secondary to multilobar PNA, influenza B, possible bacterial component as well  - follow up on urine legionella and strep pneumo - continue Rocephin day #3 for now - WBC is WNL  - lactic acid now cleared  - CBC in AM  HTN, essential - resume home medical regimen  - reasonable inpatient control   CAD - h/o MI. Trop neg. EKG w/o ACS. No CP - continue Lipitor, plavix  Depression/Anxiety - continue wellbutrin, Xanax  Thrombocytopenia  - monitor for now, overall stsable  - CBC in AM  Chronic diastolic congestive heart failure - Last echo showing EF of 55% and grade 1 diastolic dysfunction. No evidence of the conversation at this time. BNP 213. - resume lasix today   GERD - continue PPI  Obesity  - Body mass index is 31.7 kg/m.  DVT prophylaxis: Lovenox SQ Code Status: Full  Family Communication: Patient at bedside  Disposition Plan: likely home in AM  Consultants:   None   Procedures:   None  Antimicrobials:   Rocephin 3/27 -->  Tamiflu 3/28 -->    Subjective: Reports feeling better this AM.   Objective: Vitals:   07/14/16 2148 07/15/16 0638 07/15/16 0813 07/15/16 1153  BP:  136/67  (!) 120/59  Pulse:  65  (!) 54  Resp:  18  18  Temp:  97.8 F (36.6 C)  97.2 F (36.2 C)  TempSrc:  Oral  Oral  SpO2: 95% 93% 90% 98%  Weight:  97.3 kg (214 lb 6.4 oz)    Height:        Intake/Output Summary (Last 24 hours) at 07/15/16 1302 Last data filed at 07/15/16 0800  Gross per 24 hour  Intake              460 ml  Output             1100 ml  Net             -640 ml   Filed Weights   07/13/16 0922 07/13/16 1629 07/15/16 3810  Weight: 95.3 kg (210 lb) 100.2 kg (220 lb 14.4 oz) 97.3 kg (214 lb 6.4 oz)    Examination:  General exam: Appears calm and comfortable  Respiratory system: better air movement, still exp wheezing noted at lung bases bilaterally  Cardiovascular system: S1 & S2 heard, RRR. No rubs, gallops or clicks. No pedal edema. Gastrointestinal system: Abdomen is nondistended, soft and nontender. No organomegaly or masses felt. Central nervous system: Alert and oriented. No focal neurological deficits. Extremities: Symmetric 5 x 5  power.  Data Reviewed: I have personally reviewed following labs and imaging studies  CBC:  Recent Labs Lab 07/13/16 0933 07/14/16 0452 07/15/16 0514  WBC 14.5* 10.2 8.7  NEUTROABS 12.7*  --   --   HGB 12.9* 12.2* 12.4*  HCT 38.6* 37.6* 37.4*  MCV 94.1 94.2 93.7  PLT 136* 107* 765*   Basic Metabolic Panel:  Recent Labs Lab 07/13/16 0933 07/14/16 0452 07/15/16 0514  NA 139 135 136  K 4.5 3.9 4.3  CL 104 99* 101  CO2 25 25 28   GLUCOSE 179* 124* 155*  BUN 15 14 18   CREATININE 1.15 1.05 1.00  CALCIUM 8.4* 8.4* 8.5*   Liver Function Tests:  Recent Labs Lab 07/13/16 0933  AST 26  ALT 25  ALKPHOS 91  BILITOT 0.9  PROT 6.1*  ALBUMIN 3.5   BNP (last 3 results)  Recent Labs  08/12/15 1326  PROBNP 35.0   Urine analysis:    Component Value Date/Time    COLORURINE YELLOW 07/13/2016 [place] 07/13/2016 1137   LABSPEC 1.023 07/13/2016 1137   PHURINE 5.0 07/13/2016 1137   GLUCOSEU NEGATIVE 07/13/2016 1137   GLUCOSEU NEGATIVE 03/30/2016 1056   HGBUR NEGATIVE 07/13/2016 1137   BILIRUBINUR NEGATIVE 07/13/2016 1137   KETONESUR 5 (A) 07/13/2016 1137   PROTEINUR NEGATIVE 07/13/2016 1137   UROBILINOGEN 0.2 03/30/2016 1056   NITRITE NEGATIVE 07/13/2016 1137   LEUKOCYTESUR NEGATIVE 07/13/2016 1137   Radiology Studies: Ct Chest Wo Contrast  Result Date: 07/14/2016 CLINICAL DATA:  Initial evaluation for dyspnea, dry cough. EXAM: CT CHEST WITHOUT CONTRAST TECHNIQUE: Multidetector CT imaging of the chest was performed following the standard protocol without IV contrast. COMPARISON:  Prior radiograph from 07/13/2016. FINDINGS: Cardiovascular: Intrathoracic aorta of normal caliber. Scattered atheromatous plaque throughout the aortic arch and descending intrathoracic aorta. Heart size within normal limits. Three vessel coronary artery calcifications. No pericardial effusion. Mediastinum/Nodes: Thyroid normal. Shotty subcentimeter mediastinal lymph nodes noted. No pathologically enlarged mediastinal, hilar, or axillary lymph nodes identified. Esophagus within normal limits. Lungs/Pleura: Tracheobronchial tree is patent. Patchy multifocal ground-glass opacities present within the right middle, right lower, and left lower lobe, with greatest involvement within the left lower lobe. Findings concerning for possible multifocal infection/pneumonia. Relative sparing of the upper lobes bilaterally. No pulmonary edema or pleural effusion. No pneumothorax. No worrisome pulmonary nodule or mass. Upper Abdomen: Visualized upper abdomen within normal limits. Musculoskeletal: No acute osseous abnormality. No worrisome lytic or blastic osseous lesions. IMPRESSION: 1. Patchy multifocal opacities involving the bilateral lower lobes and right middle lobe, concerning  for multifocal pneumonia. Involvement greatest within the left lower lobe. 2. No other acute cardiopulmonary abnormality identified. 3. Moderate aortic atherosclerosis with diffuse 3 vessel coronary artery calcifications. Electronically Signed   By: Jeannine Boga M.D.   On: 07/14/2016 22:14   Scheduled Meds: . amLODipine  5 mg Oral Daily  . atorvastatin  40 mg Oral q1800  . buPROPion  150 mg Oral BID  . carvedilol  12.5 mg Oral BID WC  . cefTRIAXone (ROCEPHIN)  IV  1 g Intravenous Q24H  . clopidogrel  75 mg Oral Daily  . enoxaparin (LOVENOX) injection  40 mg Subcutaneous Q24H  . guaiFENesin  1,200 mg Oral BID  . ipratropium-albuterol  3 mL Nebulization TID  . loratadine  10 mg Oral Daily  . methylPREDNISolone (SOLU-MEDROL) injection  40 mg Intravenous Q12H  . mometasone-formoterol  2 puff Inhalation BID  . montelukast  10 mg Oral QHS  .  oseltamivir  75 mg Oral BID  . pantoprazole  40 mg Oral Daily  . sodium chloride flush  3 mL Intravenous Q12H   Continuous Infusions:   LOS: 2 days   Time spent: 20 minutes   Faye Ramsay, MD [facility] Pager [phone]  If 7PM-7AM, please contact night-coverage [email or web address] Password [email or web address] 07/15/2016, 1:02 PM

## 2016-07-16 LAB — CBC
HCT: 38.5 % — ABNORMAL LOW (ref 39.0–52.0)
Hemoglobin: 12.6 g/dL — ABNORMAL LOW (ref 13.0–17.0)
MCH: 30.9 pg (ref 26.0–34.0)
MCHC: 32.7 g/dL (ref 30.0–36.0)
MCV: 94.4 fL (ref 78.0–100.0)
PLATELETS: 106 10*3/uL — AB (ref 150–400)
RBC: 4.08 MIL/uL — ABNORMAL LOW (ref 4.22–5.81)
RDW: 12.8 % (ref 11.5–15.5)
WBC: 11.6 10*3/uL — AB (ref 4.0–10.5)

## 2016-07-16 LAB — BASIC METABOLIC PANEL
ANION GAP: 10 (ref 5–15)
BUN: 19 mg/dL (ref 6–20)
CALCIUM: 8.6 mg/dL — AB (ref 8.9–10.3)
CO2: 26 mmol/L (ref 22–32)
CREATININE: 1 mg/dL (ref 0.61–1.24)
Chloride: 103 mmol/L (ref 101–111)
GFR calc Af Amer: >60 mL/min (ref >60)
GLUCOSE: 163 mg/dL — AB (ref 65–99)
Potassium: 4.7 mmol/L (ref 3.5–5.1)
Sodium: 139 mmol/L (ref 135–145)

## 2016-07-16 MED ORDER — OSELTAMIVIR PHOSPHATE 75 MG PO CAPS: 75.0000 mg | ORAL_CAPSULE | Freq: Two times a day (BID) | ORAL | 0 refills | Status: DC

## 2016-07-16 MED ORDER — GUAIFENESIN-DM 100-10 MG/5ML PO SYRP: 5.0000 mL | ORAL_SOLUTION | ORAL | 0 refills | Status: DC | PRN

## 2016-07-16 MED ORDER — PREDNISONE 10 MG PO TABS: ORAL_TABLET | ORAL | 0 refills | Status: DC

## 2016-07-16 MED ORDER — DOXYCYCLINE HYCLATE 100 MG PO TABS
100.0000 mg | ORAL_TABLET | Freq: Two times a day (BID) | ORAL | Status: DC
  Administered 2016-07-16: 100 mg via ORAL
  Filled 2016-07-16: qty 1

## 2016-07-16 MED ORDER — DOXYCYCLINE HYCLATE 100 MG PO TABS: 100.0000 mg | ORAL_TABLET | Freq: Two times a day (BID) | ORAL | 0 refills | Status: DC

## 2016-07-16 MED ORDER — SACCHAROMYCES BOULARDII 250 MG PO CAPS: 250.0000 mg | ORAL_CAPSULE | Freq: Two times a day (BID) | ORAL | 0 refills | Status: DC

## 2016-07-16 NOTE — Discharge Summary (Signed)
Physician Discharge Summary  Larry Stone SEG:[id number] DOB: 1951-01-24 DOA: 07/13/2016  PCP: [name], MD  Admit date: 07/13/2016 Discharge date: 07/16/2016  Recommendations for Outpatient Follow-up:  1. Pt will need to follow up with PCP in 1-2 weeks post discharge 2. Please obtain BMP to evaluate electrolytes and kidney function 3. Complete Tamiflu for 3 more days 4. Complete doxycycline for 5 more days   Discharge Diagnoses:  Active Problems:   Sepsis ([place])   Depression with anxiety   Acute respiratory failure with hypoxia (HCC)   Chronic diastolic CHF (congestive heart failure) (HCC)   Sepsis, unspecified organism [facility])  Discharge Condition: Stable  Diet recommendation: Heart healthy diet discussed in details   Brief Narrative:  66 y.o.malewith medical history significant of CAD/MI, diabetes, GERD, hypertension, hyperlipidemia, OSA, recurrent aspiration pneumonia, presenting with 2 days of cough, wheezing and SOB. Cough is non-productive. No home O2. Febrile up to 103. Home nebulizers w/ limited improvement. Pt states that this typically happens every spring and fall.   Assessment & Plan:   Acute hypoxic respiratory failure - Likely from COPD exacerbation and multilobar PNA, influenza B +, possible bacterial component  - pt reports feeling better this AM, able to take off oxygen  - wants to go home - will change solumedrol to Prednisone, change Rocephin to Doxycycline   Sepsis  - secondary to multilobar PNA, influenza B, possible bacterial component as well  - ABX as noted above - also must complete Tamiflu as noted above  - WBC is slightly up this am but that is likely steroid induced   HTN, essential - continue home medical regimen  - reasonable inpatient control   CAD - h/o MI. Trop neg. EKG w/o ACS. No CP - continue Lipitor, plavix  Depression/Anxiety - continue wellbutrin, Xanax per home medical regimen   Thrombocytopenia  - monitor for  now, overall stsable   Chronic diastolic congestive heart failure - Last echo showing EF of 55% and grade 1 diastolic dysfunction. No evidence of the conversation at this time. BNP 213. - resume home regimen with Lasix   GERD - continue PPI  Obesity  - Body mass index is 31.7 kg/m.  DVT prophylaxis: Lovenox SQ Code Status: Full  Family Communication: Patient at bedside  Disposition Plan: home today   Consultants:   None   Procedures:   None  Antimicrobials:   Rocephin 3/27 --> 3/30  Doxycycline 3/30 --> complete 5 more days post discharge   Tamiflu 3/28 -->   Procedures/Studies: Dg Chest 2 View  Result Date: 07/13/2016 CLINICAL DATA:  66 year old male with shortness of breath, fever nonproductive cough for the past 3 days, worsening today. EXAM: CHEST  2 VIEW COMPARISON:  Multiple priors, most recently 08/12/2015. FINDINGS: Lung volumes are low. Mild diffuse peribronchial cuffing. No consolidative airspace disease. No pleural effusions. No pneumothorax. No pulmonary nodule or mass noted. Pulmonary vasculature and the cardiomediastinal silhouette are within normal limits. Aortic atherosclerosis. IMPRESSION: 1. Mild diffuse peribronchial cuffing, concerning for mild bronchitis. 2. Aortic atherosclerosis. Electronically Signed   By: Vinnie Langton M.D.   On: 07/13/2016 10:11   Ct Chest Wo Contrast  Result Date: 07/14/2016 CLINICAL DATA:  Initial evaluation for dyspnea, dry cough. EXAM: CT CHEST WITHOUT CONTRAST TECHNIQUE: Multidetector CT imaging of the chest was performed following the standard protocol without IV contrast. COMPARISON:  Prior radiograph from 07/13/2016. FINDINGS: Cardiovascular: Intrathoracic aorta of normal caliber. Scattered atheromatous plaque throughout the aortic arch and descending intrathoracic aorta. Heart  size within normal limits. Three vessel coronary artery calcifications. No pericardial effusion. Mediastinum/Nodes: Thyroid normal. Shotty  subcentimeter mediastinal lymph nodes noted. No pathologically enlarged mediastinal, hilar, or axillary lymph nodes identified. Esophagus within normal limits. Lungs/Pleura: Tracheobronchial tree is patent. Patchy multifocal ground-glass opacities present within the right middle, right lower, and left lower lobe, with greatest involvement within the left lower lobe. Findings concerning for possible multifocal infection/pneumonia. Relative sparing of the upper lobes bilaterally. No pulmonary edema or pleural effusion. No pneumothorax. No worrisome pulmonary nodule or mass. Upper Abdomen: Visualized upper abdomen within normal limits. Musculoskeletal: No acute osseous abnormality. No worrisome lytic or blastic osseous lesions. IMPRESSION: 1. Patchy multifocal opacities involving the bilateral lower lobes and right middle lobe, concerning for multifocal pneumonia. Involvement greatest within the left lower lobe. 2. No other acute cardiopulmonary abnormality identified. 3. Moderate aortic atherosclerosis with diffuse 3 vessel coronary artery calcifications. Electronically Signed   By: Jeannine Boga M.D.   On: 07/14/2016 22:14     Discharge Exam: Vitals:   07/15/16 2201 07/16/16 0522  BP: (!) 144/67 127/66  Pulse: 71 70  Resp: 18 18  Temp: 97.6 F (36.4 C) 97.7 F (36.5 C)   Vitals:   07/15/16 1153 07/15/16 1359 07/15/16 2201 07/16/16 0522  BP: (!) 120/59  (!) 144/67 127/66  Pulse: (!) 54  71 70  Resp: 18  18 18   Temp: 97.2 F (36.2 C)  97.6 F (36.4 C) 97.7 F (36.5 C)  TempSrc: Oral  Oral Oral  SpO2: 98% 90% 100% 98%  Weight:    98.2 kg (216 lb 9.6 oz)  Height:       General: Pt is alert, follows commands appropriately, not in acute distress Cardiovascular: Regular rate and rhythm, S1/S2 +, no rubs, no gallops Respiratory: Mild rhonchi at bases Abdominal: Soft, non tender, non distended, bowel sounds +, no guarding  Discharge Instructions  Discharge Instructions    Diet - low  sodium heart healthy    Complete by:  As directed    Increase activity slowly    Complete by:  As directed      Allergies as of 07/16/2016      Reactions   Ace Inhibitors Swelling   Angiotensin Receptor Blockers Swelling   Aspirin Hives   Codeine Nausea And Vomiting   Irbesartan Other (See Comments)   REACTION: allergic to ARBs w/ angioedema   Ramipril Other (See Comments)   REACTION: Allergic to ACE's w/ angioedema...      Medication List    TAKE these medications   albuterol (2.5 MG/3ML) 0.083% nebulizer solution Commonly known as:  PROVENTIL Use one vial in the nebulizer every 4-6 hours if needed for cough or wheeze   ALPRAZolam 0.5 MG tablet Commonly known as:  XANAX Take 1 tablet (0.5 mg total) by mouth 3 (three) times daily as needed.   amLODipine 5 MG tablet Commonly known as:  NORVASC TAKE 1 TABLET BY MOUTH EVERY DAY   ammonium lactate 12 % lotion Commonly known as:  LAC-HYDRIN Apply topically as directed. What changed:  how much to take  when to take this  reasons to take this   atorvastatin 40 MG tablet Commonly known as:  LIPITOR Take 1 tablet (40 mg total) by mouth daily.   budesonide-formoterol 160-4.5 MCG/ACT inhaler Commonly known as:  SYMBICORT INHALE 2 PUFFS INTO THE LUNGS 2 (TWO) TIMES DAILY.   buPROPion 150 MG 12 hr tablet Commonly known as:  WELLBUTRIN SR Take 1 tablet (150  mg total) by mouth 2 (two) times daily.   carvedilol 12.5 MG tablet Commonly known as:  COREG TAKE 1 TABLET BY MOUTH TWICE A DAY WITH A MEAL   cetirizine 10 MG tablet Commonly known as:  ZYRTEC Take 1 tablet (10 mg total) by mouth daily.   clopidogrel 75 MG tablet Commonly known as:  PLAVIX TAKE 1 TABLET (75 MG TOTAL) BY MOUTH DAILY.   clotrimazole-betamethasone cream Commonly known as:  LOTRISONE APPLY AS DIRECTED What changed:  how much to take  how to take this  when to take this  reasons to take this  additional instructions   CoQ10 100 MG  Caps Take as directed What changed:  how much to take  how to take this  when to take this  additional instructions   cyanocobalamin 1000 MCG tablet Take 1,000 mcg by mouth daily.   doxycycline 100 MG tablet Commonly known as:  VIBRA-TABS Take 1 tablet (100 mg total) by mouth every 12 (twelve) hours.   furosemide 40 MG tablet Commonly known as:  LASIX TAKE 1 TABLET BY MOUTH EVERY DAY AS NEEDED   guaiFENesin-dextromethorphan 100-10 MG/5ML syrup Commonly known as:  ROBITUSSIN DM Take 5 mLs by mouth every 4 (four) hours as needed for cough.   montelukast 10 MG tablet Commonly known as:  SINGULAIR TAKE 1 TABLET BY MOUTH AT BEDTIME   nitroGLYCERIN 0.4 MG SL tablet Commonly known as:  NITROSTAT PLACE 1 TABLET (0.4 MG TOTAL) UNDER THE TONGUE EVERY 5 (FIVE) MINUTES AS NEEDED. FOR UP TO 3 DOSES.   oseltamivir 75 MG capsule Commonly known as:  TAMIFLU Take 1 capsule (75 mg total) by mouth 2 (two) times daily.   pantoprazole 40 MG tablet Commonly known as:  PROTONIX TAKE 1 TABLET BY MOUTH EVERY DAY 30 MINUTES BEFORE DINNERTIME   predniSONE 10 MG tablet Commonly known as:  DELTASONE Take 50 mg tablet on 07/17/2016, taper down by 10 mg daily until completed.   VIAGRA 100 MG tablet Generic drug:  sildenafil TAKE 1 TABLET BY MOUTH AS NEEDED FOR ERECTILE DYSFUNCTION.   Vitamin D3 5000 units Caps Take one tablet by mouth daily      Follow-up Information    [name], MD Follow up.   Specialty:  Internal Medicine Contact information: [address] [place] [address] [phone]        Faye Ramsay, MD Follow up.   Specialty:  Internal Medicine Why:  call my cell phone with any questions [phone] Contact information: [address] [place] [address] [phone]          The results of significant diagnostics from this hospitalization (including imaging, microbiology, ancillary and laboratory) are listed below for  reference.    Microbiology: Recent Results (from the past 240 hour(s))  Blood Culture (routine x 2)     Status: None (Preliminary result)   Collection Time: 07/13/16  9:30 AM  Result Value Ref Range Status   Specimen Description BLOOD RIGHT ANTECUBITAL  Final   Special Requests BOTTLES DRAWN AEROBIC AND ANAEROBIC 5CC  Final   Culture NO GROWTH 2 DAYS  Final   Report Status PENDING  Incomplete  Blood Culture (routine x 2)     Status: None (Preliminary result)   Collection Time: 07/13/16  9:34 AM  Result Value Ref Range Status   Specimen Description BLOOD HAND  Final   Special Requests IN PEDIATRIC BOTTLE 1CC  Final   Culture NO GROWTH 2 DAYS  Final   Report Status PENDING  Incomplete  Respiratory Panel by PCR     Status: Abnormal   Collection Time: 07/13/16  1:29 PM  Result Value Ref Range Status   Adenovirus NOT DETECTED NOT DETECTED Final   Coronavirus 229E NOT DETECTED NOT DETECTED Final   Coronavirus HKU1 NOT DETECTED NOT DETECTED Final   Coronavirus NL63 NOT DETECTED NOT DETECTED Final   Coronavirus OC43 NOT DETECTED NOT DETECTED Final   Metapneumovirus NOT DETECTED NOT DETECTED Final   Rhinovirus / Enterovirus NOT DETECTED NOT DETECTED Final   Influenza A NOT DETECTED NOT DETECTED Final   Influenza B DETECTED (A) NOT DETECTED Final   Parainfluenza Virus 1 NOT DETECTED NOT DETECTED Final   Parainfluenza Virus 2 NOT DETECTED NOT DETECTED Final   Parainfluenza Virus 3 NOT DETECTED NOT DETECTED Final   Parainfluenza Virus 4 NOT DETECTED NOT DETECTED Final   Respiratory Syncytial Virus NOT DETECTED NOT DETECTED Final   Bordetella pertussis NOT DETECTED NOT DETECTED Final   Chlamydophila pneumoniae NOT DETECTED NOT DETECTED Final   Mycoplasma pneumoniae NOT DETECTED NOT DETECTED Final     Labs: Basic Metabolic Panel:  Recent Labs Lab 07/13/16 0933 07/14/16 0452 07/15/16 0514 07/16/16 0504  NA 139 135 136 139  K 4.5 3.9 4.3 4.7  CL 104 99* 101 103  CO2 25 25 28 26    GLUCOSE 179* 124* 155* 163*  BUN 15 14 18 19   CREATININE 1.15 1.05 1.00 1.00  CALCIUM 8.4* 8.4* 8.5* 8.6*   Liver Function Tests:  Recent Labs Lab 07/13/16 0933  AST 26  ALT 25  ALKPHOS 91  BILITOT 0.9  PROT 6.1*  ALBUMIN 3.5   CBC:  Recent Labs Lab 07/13/16 0933 07/14/16 0452 07/15/16 0514 07/16/16 0504  WBC 14.5* 10.2 8.7 11.6*  NEUTROABS 12.7*  --   --   --   HGB 12.9* 12.2* 12.4* 12.6*  HCT 38.6* 37.6* 37.4* 38.5*  MCV 94.1 94.2 93.7 94.4  PLT 136* 107* 105* 106*   BNP (last 3 results)  Recent Labs  07/13/16 0933  BNP 213.7*   ProBNP (last 3 results)  Recent Labs  08/12/15 1326  PROBNP 35.0   SIGNED: Time coordinating discharge: 30 minutes  Faye Ramsay, MD  [facility] 07/16/2016, 9:49 AM Pager [phone]  If 7PM-7AM, please contact night-coverage [email or web address] Password [email or web address]

## 2016-07-16 NOTE — Discharge Instructions (Signed)
Community-Acquired Pneumonia, Adult Pneumonia is an infection of the lungs. One type of pneumonia can happen while a person is in a hospital. A different type can happen when a person is not in a hospital (community-acquired pneumonia). It is easy for this kind to spread from person to person. It can spread to you if you breathe near an infected person who coughs or sneezes. Some symptoms include:  A dry cough.  A wet (productive) cough.  Fever.  Sweating.  Chest pain. Follow these instructions at home:  Take over-the-counter and prescription medicines only as told by your doctor.  Only take cough medicine if you are losing sleep.  If you were prescribed an antibiotic medicine, take it as told by your doctor. Do not stop taking the antibiotic even if you start to feel better.  Sleep with your head and neck raised (elevated). You can do this by putting a few pillows under your head, or you can sleep in a recliner.  Do not use tobacco products. These include cigarettes, chewing tobacco, and e-cigarettes. If you need help quitting, ask your doctor.  Drink enough water to keep your pee (urine) clear or pale yellow. A shot (vaccine) can help prevent pneumonia. Shots are often suggested for:  People older than 66 years of age.  People older than 66 years of age:  Who are having cancer treatment.  Who have long-term (chronic) lung disease.  Who have problems with their body's defense system (immune system). You may also prevent pneumonia if you take these actions:  Get the flu (influenza) shot every year.  Go to the dentist as often as told.  Wash your hands often. If soap and water are not available, use hand sanitizer. Contact a doctor if:  You have a fever.  You lose sleep because your cough medicine does not help. Get help right away if:  You are short of breath and it gets worse.  You have more chest pain.  Your sickness gets worse. This is very serious if:  You  are an older adult.  Your body's defense system is weak.  You cough up blood. This information is not intended to replace advice given to you by your health care provider. Make sure you discuss any questions you have with your health care provider. Document Released: 09/22/2007 Document Revised: 09/11/2015 Document Reviewed: 07/31/2014 Elsevier Interactive Patient Education  2017 [place] Influenza, Adult Avian influenza is also known as bird flu. Avian influenza is a group of viruses that occur naturally in wild and domestic birds. Avian influenza is easily spread (contagious) among birds and is deadly to them. Birds become infected when they come into contact with infected birds or contaminated surfaces. The avian influenza viruses are spread from country to country through the international poultry trade or by migrating birds. Although rare, avian influenza can cause severe illness in humans. It is also rare for there to be human-to-human transmission of avian influenza. However, influenza viruses can change, so human-to-human transmission may become more likely in the future. What are the causes? This condition is caused by infected birds that spread avian influenza through their feces, saliva, and fluids from the nose (nasal secretions). Avian influenza can then infect humans who:  Come in contact with infected birds. This can happen with a bird that is dead or alive.  Breathe in contaminated dust.  Touch contaminated surfaces. What increases the risk? This condition is more likely to develop in people who come in close contact  with birds or poultry. What are the signs or symptoms? Symptoms of this condition include:  Fever.  Cough.  Sore throat.  Nausea and vomiting.  Diarrhea.  Muscle aches.  Tiredness (fatigue).  Inflammation or redness of the eyes (conjunctivitis).  Shortness of breath.  Difficulty breathing.  Pain in the  abdomen.  Seizures.  Mental confusion. How is this diagnosed? This condition may be diagnosed by:  Medical history and physical exam.  Chest X-ray.  Examination of a fluid sample from your throat or nose.  Blood tests. How is this treated? This condition may be treated by:  Medicines that stop the growth of the viruses in your body (antiviral medicines).  Supportive care to relieve your symptoms. Follow these instructions at home:  Take over-the-counter and prescription medicines only as told by your health care provider.  Use a cool mist humidifier. This makes breathing easier.  Rest as directed by your health care provider.  Drink enough fluid to keep your urine clear or pale yellow.  Cover your mouth and nose when coughing or sneezing.  Wash your hands well to prevent the viruses from spreading.  Avoid crowded areas. Stay home from work or school until directed by your health care provider. How is this prevented?  Stay home from work and school when you are sick. Not being in contact with other people will help stop the spread of illness.  Cover your mouth and nose with your arm when coughing or sneezing. This may help keep those around you from getting sick.  Wash your hands often with warm water and soap. Illnesses are often spread when a person touches something that is contaminated with germs and then touches his or her eyes, nose, or mouth.  If you think that you have been exposed to avian influenza, ask your health care provider about preventive antiviral medicines. These can help prevent infection from occurring. Contact a health care provider if:  You have a fever.  You have a skin rash.  You have new symptoms.  Your symptoms get worse.  You are urinating noticeably less or not at all.  Your symptoms do not get better with treatment. Get help right away if:  Your skin or nails turn bluish.  You have chest pain.  You have trouble  breathing.  You feel like your heart is fluttering, skipping a beat, or beating faster than normal.  You become confused.  You have chills, weakness, or light-headedness.  You develop a sudden headache.  You develop pain in your face or ear.  You have severe pain or stiffness in your neck.  You cough up blood.  You have nausea and vomiting that you cannot control. This information is not intended to replace advice given to you by your health care provider. Make sure you discuss any questions you have with your health care provider. Document Released: 04/08/2003 Document Revised: 10/29/2015 Document Reviewed: 09/13/2015 Elsevier Interactive Patient Education  2017 [employer].

## 2016-07-16 NOTE — Care Management Note (Signed)
Case Management Note  Patient Details  Name: [name] MRN: [id number] Date of Birth: Mar 02, 1951  Subjective/Objective:                 Patient admitted with sepsis. From home with wife. No CM needs identified at this time.   Action/Plan:  DC to home order placed.  Expected Discharge Date:  07/16/16               Expected Discharge Plan:  Home/Self Care  In-House Referral:     Discharge planning Services  CM Consult  Post Acute Care Choice:    Choice offered to:     DME Arranged:    DME Agency:     HH Arranged:    HH Agency:     Status of Service:  Completed, signed off  If discussed at [employer] of Stay Meetings, dates discussed:    Additional Comments:  Carles Collet, RN 07/16/2016, 10:43 AM

## 2016-07-16 NOTE — Progress Notes (Signed)
Pt has orders to be discharged. Discharge instructions given and pt has no additional questions at this time. Medication regimen reviewed and pt educated. Pt verbalized understanding and has no additional questions. Telemetry box removed. IV removed and site in good condition. Pt stable and waiting for transportation. 

## 2016-07-16 NOTE — Progress Notes (Signed)
Patient ambulated approximately 400 ft in the hall on room air. Oxygen saturations maintained at 92%. No home oxygen needed.

## 2016-07-18 LAB — CULTURE, BLOOD (ROUTINE X 2)
CULTURE: NO GROWTH
Culture: NO GROWTH

## 2016-07-22 ENCOUNTER — Other Ambulatory Visit (INDEPENDENT_AMBULATORY_CARE_PROVIDER_SITE_OTHER): Payer: Medicare Other

## 2016-07-22 ENCOUNTER — Ambulatory Visit (INDEPENDENT_AMBULATORY_CARE_PROVIDER_SITE_OTHER): Payer: Medicare Other | Admitting: Pulmonary Disease

## 2016-07-22 ENCOUNTER — Encounter: Payer: Self-pay | Admitting: Pulmonary Disease

## 2016-07-22 ENCOUNTER — Other Ambulatory Visit: Payer: Self-pay | Admitting: Pulmonary Disease

## 2016-07-22 ENCOUNTER — Ambulatory Visit (INDEPENDENT_AMBULATORY_CARE_PROVIDER_SITE_OTHER)
Admission: RE | Admit: 2016-07-22 | Discharge: 2016-07-22 | Disposition: A | Discharge status: Home / Self Care | Payer: Medicare Other | Source: Ambulatory Visit | Attending: Pulmonary Disease | Admitting: Pulmonary Disease

## 2016-07-22 VITALS — BP 120/70 | HR 63 | Temp 96.8°F | Ht 70.0 in | Wt 213.2 lb

## 2016-07-22 DIAGNOSIS — J129 Viral pneumonia, unspecified: Secondary | ICD-10-CM | POA: Diagnosis not present

## 2016-07-22 DIAGNOSIS — J4489 Other specified chronic obstructive pulmonary disease: Secondary | ICD-10-CM

## 2016-07-22 DIAGNOSIS — R0609 Other forms of dyspnea: Secondary | ICD-10-CM

## 2016-07-22 DIAGNOSIS — I5032 Chronic diastolic (congestive) heart failure: Secondary | ICD-10-CM | POA: Diagnosis not present

## 2016-07-22 DIAGNOSIS — J449 Chronic obstructive pulmonary disease, unspecified: Secondary | ICD-10-CM | POA: Diagnosis not present

## 2016-07-22 DIAGNOSIS — I2510 Atherosclerotic heart disease of native coronary artery without angina pectoris: Secondary | ICD-10-CM

## 2016-07-22 DIAGNOSIS — I10 Essential (primary) hypertension: Secondary | ICD-10-CM | POA: Diagnosis not present

## 2016-07-22 DIAGNOSIS — K219 Gastro-esophageal reflux disease without esophagitis: Secondary | ICD-10-CM

## 2016-07-22 DIAGNOSIS — J441 Chronic obstructive pulmonary disease with (acute) exacerbation: Secondary | ICD-10-CM | POA: Diagnosis not present

## 2016-07-22 DIAGNOSIS — J9601 Acute respiratory failure with hypoxia: Secondary | ICD-10-CM

## 2016-07-22 LAB — CBC WITH DIFFERENTIAL/PLATELET
Basophils Absolute: 0 10*3/uL (ref 0.0–0.1)
Basophils Relative: 0.3 % (ref 0.0–3.0)
EOS ABS: 0.4 10*3/uL (ref 0.0–0.7)
EOS PCT: 2.7 % (ref 0.0–5.0)
HCT: 45.7 % (ref 39.0–52.0)
Hemoglobin: 15.3 g/dL (ref 13.0–17.0)
LYMPHS ABS: 1.9 10*3/uL (ref 0.7–4.0)
Lymphocytes Relative: 13.9 % (ref 12.0–46.0)
MCHC: 33.4 g/dL (ref 30.0–36.0)
MCV: 93.4 fl (ref 78.0–100.0)
MONO ABS: 1.8 10*3/uL — AB (ref 0.1–1.0)
Monocytes Relative: 13 % — ABNORMAL HIGH (ref 3.0–12.0)
NEUTROS PCT: 70.1 % (ref 43.0–77.0)
Neutro Abs: 9.5 10*3/uL — ABNORMAL HIGH (ref 1.4–7.7)
Platelets: 192 10*3/uL (ref 150.0–400.0)
RBC: 4.89 Mil/uL (ref 4.22–5.81)
RDW: 13.6 % (ref 11.5–15.5)
WBC: 13.6 10*3/uL — AB (ref 4.0–10.5)

## 2016-07-22 LAB — COMPREHENSIVE METABOLIC PANEL
ALBUMIN: 3.8 g/dL (ref 3.5–5.2)
ALK PHOS: 90 U/L (ref 39–117)
ALT: 38 U/L (ref 0–53)
AST: 18 U/L (ref 0–37)
BUN: 25 mg/dL — AB (ref 6–23)
CO2: 32 mEq/L (ref 19–32)
CREATININE: 1.18 mg/dL (ref 0.40–1.50)
Calcium: 9.2 mg/dL (ref 8.4–10.5)
Chloride: 100 mEq/L (ref 96–112)
GFR: 65.61 mL/min (ref >60.00)
GLUCOSE: 89 mg/dL (ref 70–99)
Potassium: 4.3 mEq/L (ref 3.5–5.1)
SODIUM: 138 meq/L (ref 135–145)
TOTAL PROTEIN: 6.7 g/dL (ref 6.0–8.3)
Total Bilirubin: 1.3 mg/dL — ABNORMAL HIGH (ref 0.2–1.2)

## 2016-07-22 LAB — BRAIN NATRIURETIC PEPTIDE: PRO B NATRI PEPTIDE: 59 pg/mL (ref 0.0–100.0)

## 2016-07-22 LAB — SEDIMENTATION RATE: SED RATE: 18 mm/h (ref 0–20)

## 2016-07-22 MED ORDER — METHYLPREDNISOLONE 8 MG PO TABS: ORAL_TABLET | ORAL | 0 refills | Status: DC

## 2016-07-22 NOTE — Patient Instructions (Signed)
Today we updated your med list in our EPIC system...     We reviewed your recent hosp stay & the objective data available to [place]...    Today we checked a follow up CXR 7 blood work...       We will contact you w/ the results when available...   Continue your NEB treatments 3-4 times daily for now... Continue your Symbicort160- 2 sprays twice daily & rinse... Add-in the OTC MUCINEX 600mg  tabs - 2 tabd twice daily w/ lots of fluids, hot tea w/ lemon & honey, chicken soup!  We wrote a new prescription for MEDROL 8mg  tabs to take as follows >>    Start w/ one tab twice daily for 4 days...    Then decrease to 1 tab each AM for 4 days...    Then decrease to 1/2 tab daily each AM for 4 days...    Then decrease to 1/2 tab every other day til gone...  Rest at home & gradually increase your activities as you are able...  Call for any questions...  Let's plan a follow up visit in 2-3 weeks.Marland KitchenMarland Kitchen

## 2016-07-22 NOTE — Progress Notes (Signed)
Subjective:    Patient ID: Larry Stone, male    DOB: March 10, 1951, 66 y.o.   MRN: [id number]  HPI 66 y/o WM here for a followup visit... he has multiple medical problems as noted below...  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 and DrPlotnikov for Primary Care- Chol, Impaired fasting glucose, etc... ~  SEE PREV EPIC NOTES FOR OLDER DATA >>    CT Chest 01/2010>>  1.50mm right middle lobe lung nodule is similar back to the abdominal CT of 02/16/2003, consistent with benignity, most likely a subpleural lymph node;  2. No acute process in the chest;  3 Distal esophageal wall thickening again suggests esophagitis;  4.Mild centrilobular emphysema;  5.Age advanced atherosclerosis.  CXR 1/14 showed normal heart size, clear lungs, chr right rib deformity (old fx), NAD...  LABS 1/14:  FLP- at goals on Lip40+Niasp;  Chems- wnl;  CBC- wnl;  TSH=1.93;  PSA=0.39;  Testos=307 (350-890) on Testim 2tubes/d;  B12=1005 (211-911) on B12 2076mcg/d...  LABS 4/15:  FLP- at goals on Lip40;  Chems- ok w/ BS=115, A1c=6.0, Cr=1.2;  CBC- wnl;  TSH=1.55;  PSA=0.39...   CXR 8/15 showed normal heart size, clear lungs w/o infiltrates, old healed right rib fxs, NAD...   PFT 8/15 showed FVC=2.32 (50%), FEV1=1.63 (45%), %1sec=70, mid-flows=33% predicted; suggestive of GOLD Stage 3 COPD but can't r/o superimposed restriction w/o lung volume measurement...  LABS 8/15:  SPE/IEP w/o monoclonal gammopathy; Quant Ig's showed normal IgG, normal IgA, low IgM at 26 (40-250), and a hi IgE at 1362 => we will follow w/ RAST panel & consider Xolair Rx...  RAST Panel 11/15 => returned w/ IgE level = 1497 and Allergen panel pos for some molds, grasses, trees, ragweed, cats>dogs; and Food panel pos for Milk> Wheat> Shrimp/ Tomato/ Peanut, etc.  ~  March 18, 2014:  66mo ROV & f/u dyspnea> [name] states that the [facility] was  $400 at the Pharm & they/he didn't call for replacement inhaler, states he filled it one time & ran out several wks ago, we discussed what to do in that eventuality (call us to let us know & get a different Rx), we will try SYMBICORT160-2spBid & he will let us know what his [employer] co-pay is for this med;  Similarly he did not return to get the RAST tests done, but he informs me that they were drawn several days ago w/ blood work for [employer]- results pending...  We reviewed his prev Hx/ symptoms, exam (chest is clear today), and evaluation; all questions answered, we decided to try the Symbicort160 regularly over the next 52mo & plan f/u Full-PFTs in the future;  In the meanwhile we will await the results of the RAST tests and decide regarding Xolair vs Allergy evaluation...    We reviewed prob list, meds, xrays and labs> see below for updates >> He was given the 2015 flu vaccine recently and the Prevnar-13 vaccination as well...  LABS 11/15:  FLP- at goals on Lip40;  Chems- wnl w/ BS=105, A1c=6.1;  CBC- wnl w/ Hg=15.1;  TSH=1.77, PSA=0.50...   RAST Test 11/15:  returned w/ IgE level = 1497 and Allergen panel pos for some molds, grasses, trees, ragweed, cats>dogs; and Food panel pos for Milk> Wheat> Shrimp/ Tomato/ Peanut, etc... We discussed avoidance strategies and other options- he is interested in Xolair & we will proceed w/ the paperwork for Xolair  approval...   ~  October 15, 2014:  30mo ROV & [name] is still awaiting insurance approval for Xolair!  He reports removing 2 ticks from his privates recently!  Weight down 12# on diet and he notes some improvement in his dyspnea- now just noted w/ intense activ he says eg- climbing/ stairs/ etc but walking is ok...     OSA> s/p UPPP surg 2004; uses Transderm scope prn dizziness, uses "breathe right nasal strips" periodically; resting OK, no daytime hypersomnolence...    COPD & HxBronchitis> on Symbicort160-2Bid & Zyrtek prn; doing satis w/o cough,  phlegm, etc...    Elev IgE at 1362 & 1497; see RAST results above;  We are awaiting approval for Xolair...    His last Cards check was 12/25 w/ DrMcAlhany> Hx MI in 2004 w/ resid 50% Lmain; had CardiacCT 2011 w/ 50% LAD, Myoview 2015 w/ inferolat scar, no ischemia, EF=52%; clinically stable & not c/o CP & no changes made...    He had medical f/u DrPlotnikov 5/16> HBP, CAD, HL, anxiety- all stable on meds and no changes made...    He continues to f/u w/ Podiatry- DrRegal> seen 4/16 for heel pain, plantar faciitis- injected & given brace + oral anti-inflamm rx... We reviewed prob list, meds, xrays and labs> see below for updates >>  ADDENDUM>>  [name] was denied by his insurance company...  ~  April 04, 2015:  2mo ROV & Don called for an add-on appt >  He is c/o a 7mo hx sudden onset SOB/DOE, fatigue, cough & chest congestion w/ sl beige phlegm, no hemoptysis, no CP x sore from coughing, and no f/c/s;  We called in Pred dosepak but he says no better, not resting, ?cough worse Qhs, incr SOB & he denies any reflux/ GERD, etc... He has been using the Symbicort160-2spBid & Zyrtek10... EXAM shows Afeb, VSS, O2sat=98% on RA;  HEENT- neg, mallamapti2;  Chest- decr BS at bases, dry cough, clear w/o w/r/r;  Heart- RR w/o m/r/g;  Abd- soft, neg;  Ext- neg w/o c/c/e...   CXR 04/04/15 showed norm heart size, clear lungs, old right rib fx, NAD...  IMP/PLAN>>  [name] appears to have a refractory COPD exac & we discussed additional treatment w/ ZPak, Depo80/Medrol8mg  tapering sched, add INCRUSE one sp daily, Mucinex 1200mg Bid + fluids, and OK to use mother's old NEB machine  W/ Albut Tid;  Also wrote for [facility] for prn use...  ~  May 01, 2015:  22mo ROV & Don reports that he is better overall from his refractory AB but cough is lingering w/ a definite night-time pattern;  Notes some wheezing & chest soreness;  He has been on mother's NEB machine Bid, Symbicort160-2spBid, Incruse daily, Medrol8 taper- down to  1/2Qod, Hycodan prn;  We decided to continue the Medrol4mg Qod & institute a vigorous antireflux regimen- Protonix40 taken 57min before dinner, NPO after dinner, elev HOB 6"... EXAM shows Afeb, VSS, O2sat=98% on RA;  HEENT- neg, mallamapti2;  Chest- decr BS at bases, min cough & exp wheezing w/o consolidation;  Heart- RR w/o m/r/g;  Abd- soft, neg;  Ext- neg w/o c/c/e...  IMP/PLAN>>  [name] is improved but w/ persist symptoms esp cough at night- we decided to continue the Medrol4mg  Qod & institute a vigorous antireflux regimen... Plan ROV 6-8wks.  ~  June 30, 2015:  5mo ROV & [name] reports that his breathing is better, min chest congestion & non-productive cough, still notes occas wheezing; no CP, no tightness, no f/c/s;  NOTE>  HE HAS STOPPED THE MEDROL& STOPPED THE NEBS on his own, still using the Symbicort & Incruse regularly; rec to add MUCINEX600-2Bid w/ fluids & [facility]...  EXAM shows Afeb, VSS, O2sat=98% on RA;  HEENT- clears throat/ ?VCD, mallamapti2;  Chest- decr BS at bases, min cough & exp wheezing w/o consolidation;  Heart- RR w/o m/r/g;  Abd- soft, neg;  Ext- neg w/o c/c/e...  IMP/PLAN>>  We will refer to ENT for completeness & upper airway symptoms, throat clearing, etc; he needs to maintain a vigorous antireflux regimen + Symbicort/ Incruse/ Albut NEBS...   ~  August 12, 2015:  6wk ROV & add-on appt requested due to incr dyspnea>  [name] reports that he was doing satis on the Symbicort160-2spBid & Incruse daily (having prev stopped the NEB rx and Medrol) but called yest w/ temp 103 he says, felt "out of it" w/ chills, aching/sore, fatigued/ weak and incr cough w/o much sput production;  He says he saw ENT in the interim- nothing wrong, no change in rx recommended (we do not have note from them);  Note- his wt is down 11# to 211# today...    OSA> s/p UPPP surg 2004; uses Transderm scope prn dizziness, uses "breathe right nasal strips" periodically; resting OK, no  daytime hypersomnolence...    COPD & HxBronchitis> on Symbicort160-2Bid & Incruse daily; has his mother's old NEB machine & using Albut prn;  Now c/o dry cough, T103, etc (as above)=>     Elev IgE at 1362 & 1497; see RAST results above;  His insurance denied Xolair rx; he has been on steroids and uses Zyrtek10...    His last Cards check was 05/08/15 w/ DrMcAlhany> Hx MI in 2004 w/ resid 50% Lmain; had CardiacCT 2011 w/ 50% LAD, Myoview 2015 w/ inferolat scar, no ischemia, EF=52%;  Myoview 6/15 showed inferolat scarno ischemia, EF=52%; clinically stable & not c/o CP & no changes made...    He had medical f/u DrPlotnikov 5/16> HBP, CAD, HL, anxiety- all stable on meds and no changes made...    He continues to f/u w/ Podiatry- DrRegal> seen 4/16 for heel pain, plantar faciitis- injected & given brace + oral anti-inflamm rx... EXAM shows Afeb, VSS, O2sat=92% on RA;  HEENT- clears throat/ ?VCD, mallamapti2;  Chest- decr BS at bases, min cough & no w/r/r/ or consolidation;  Heart- RR w/o m/r/g;  Abd- soft, neg;  Ext- neg w/o c/c/e...   CXR 08/12/15> norm heart size, no acute pulm infiltrates/ effusion/ etc, right lat pleural thickening; degen changes in spine...  LABS 08/12/15> Chems- wnl  X BS=121, Cr=1.2, TBili=1.8;  BNP= 35;  CBC- ok x WBC=15.7, Hg=14.9;  Sed=41; IMP/PLAN>>  We decided to treat w/ LEVAQUIN x7d, Depo80, PRED20mg - 7d tapering sched; use NEB w/ DUONEB Tid followed by Symbicort & Incruse; continue to follow the vigorous antireflux regimen; ROV in 3 weeks time...  ~  Sep 03, 2015:  3wk ROV & [name] reports some improvement on his regimen of PRED20mg -1/2 tab Qam now, NEBs w/ Duoneb Tid, Symbicort160-2spBid, Incruse daily; he notes "improved, but not well" w/ CC= dry cough, in paroxysms, hacking, small amt thick sputum, no f/c/s, no CP, no SOB x w/ the coughing spells; he notes that he coughs in restaurants, notes the coughing spells occur w/o rhyme or reason, better if he wals outside...  EXAM shows  Afeb, VSS, O2sat=98% on RA;  HEENT- neg, mallamapti2;  Chest- decr BS at bases, clear, no w/r/r/ or  consolidation;  Heart- RR w/o m/r/g;  Abd- soft, neg;  Ext- neg w/o c/c/e...  IMP/PLAN>>  [name] needs a new NEBULIZER machine; he is asked to wean the Pred from 10mg /d to 10mg  alternating w/ 5mg  QOD; contuinue the Duoneb tid, Symbicort-2Bid, Incruse daily, Hycodan prn; he needs to redouble his efforts at the antireflux regimen;  We will refer to DrKozlow for his assessment of refractory asthmatic bronchitis- recall the RAST was abn 2015 & IgE level ~1400 but insurance denied Xolair... NOTE>>  We gave him the second Pneumovax 23 today & this gets him up-to-date on his immuniz.  ~  October 15, 2015:  6wk ROV & [name] has established w/ DrKozlow for allergy/immunology work up & to try & get him approved for Xolair;  He has done quite a bit of reading on the internet & asked several good questions about his condition- answered to the best of my ability;  Currently taking Symbicort160-2spBid, Incruse once daily, Pred 10mg  tabs tapering (tapering off more quickly on DrKozlow's protocol), Singulair10, NEBS w/ ?Albut2.5 prn (we had him on Duoneb Tid), Zyrtek10, Hycodan prn;  DrKozlow has started the paperwork for Xolair shots thru his new insurance;  He reports that his breathing is better- sl cough, sm amt beige sput, no hemoptysis, improved SOB, no CP, no recent f/c/s, etc...    EXAM shows Afeb, VSS, O2sat=97% on RA;  HEENT- clears throat min/ ?VCD, mallamapti2;  Chest- decr BS at bases, no cough & no w/r/r;  Heart- RR w/o m/r/g;  Abd- soft, neg;  Ext- neg w/o c/c/e...   Spirometry x2 by DrKozlow 6/13 & 6/27 showed FEV1 improved 33% to 1.98L (61% pred) on his regimen...  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 IMP/PLAN>>  COPD w/ Asthma, improved w/ adjustments from DrKozlow, they are continuing his COPD meds Symbicort/ Incruse), added Singulair, and changed Duoneb to albut prn; he has  also highlighted the need for anti-reflux regimen (ProtonixAM & Ranitadine-PM); hoping for approval of Xolair, they plan f/u in 2-49mo and he will call us in the interim for any issues...   ~  July 22, 2016:  38mo ROV & post hospital visit>  I last saw [name] ~46mo ago- see above, he has established w/ DrKozlow for an allergy/ immunology work up & DrPlotnikov for PCP>>    DrKozlow's eval revealed mixed COPD/Emphysema w/ revers (asthmatic) component, allergic rhinitis, LPRD; he weaned Don off Pred, continued symbicort, stopped Incruse, continued singulair/ rhinocort, albutHFA & had him stop the NEBS; they submitted for xolair but cost from his insurance was prohibitive, continued his antireflux regimen & allergen avoidance etc... Pt did not f/u w/ DrKozlow after 12/2015 visit...     Pt had general medical check w/DdrPlotnikov [place] [id number]- notes reviewed...    Then in Feb2018 he saw DrMcAlhany in [place] for [place]- known HBP, CAD- s/p IWMI 2004 w/ med rx, HL;  Notes reviewed- pt had CATH 06/17/16 showing nonobstructive CAD w/ 30-40% Lmain; norm LV sys function, elev filling pressures=> started Lasix40 daily... He hasn't seen Cards back in the office yet...    Then he saw PCP back 06/30/16> ?what was going on? c/o arthralgias? They stopped his Symbicort?    He presented to the ER 07/13/16 w/ dry cough, incr SOB, wheezing, hypoxemic and Temp to 103;  He was felt to be septic, given 3L IVF, no sputum, BC neg, only pos titer was FluB; Treated w/ O2, Levaquin/ Vanco empirically +  Tamiflu & Solumedrol;  CXR showed sl incr markings, no infiltrate; subseq CT Chest showed bilat patchy GGO thought c/w infection;  He was disch home on 3/30 w/ Doxy, Tamiflu, Pred10/d, NEBS, Symbicort160, Singulair & GFN...   CXR 07/13/16 showed decr lung vols, mild peribronch cuffing but no consolidative airsp dis, +Ao atherosclerosis, otherw neg...  CT Chest 07/14/16 showed norm heart size & Ao and coronary atherosclerosis, no pathologically  enlarged LNs, patchy multifocal GGO in RML, RLL, LLL, no masses, upper abd & bones all neg...  LABS 06/2016>  Chems- ok x BS120-160, Cr was ok at 1.00;  Lactate was elev at 2.77 & PCT was elev at 0.77;  CBC was ok w/ Hg=12.2, mcv=94, WBC=10.2    OSA> s/p UPPP surg 2004; not on CPAP; uses Transderm scope prn dizziness, uses "breathe right nasal strips" periodically; resting OK, no daytime hypersomnolence...    COPD w/ revers component & Hx recurrent bronchitic infections in the past> DrKozlow had stopped most of his meds in Sep2017, DrPlot stopped Symbicory 12/17; he developed FluB pneumonitis 3/18 as above=> still feeling weak, fatigued, cough w/ sm amt yellow sput, SOB/DOE w/ ADLs now; no f/c/s, no CP/ palpit, 1+edema in LEs...    Elev IgE at 1362 & 1497; see RAST results;  His 1st insurance denied Xolair rx; 2nd insurance approved it but co-pay was too high...    CARDS issues> followed by Vivi Barrack- Hx MI in 2004 w/ resid 50% Lmain; had CardiacCT 2011 w/ 50% LAD, Myoview 2015 w/ inferolat scar, no ischemia, EF=52%;  Cathed 06/2016 for incr DOE w/ 30-40% Lmain otherw nonobstructive dis, norm sys function but elev filling pressures=> DrMcAlhany started Lasix40 but this was stopped w/ [facility] 3/18...    Medical issues> followed by DrPlotnikov- HBP, CAD, HL, anxiety    He continues to f/u w/ Podiatry- DrRegal> seen 4/16 for heel pain, plantar faciitis- injected & given brace + oral anti-inflamm rx...    EXAM shows Afeb, VSS, O2sat=93% on RA;  HEENT- s/p UPPP, ?VCD, mallamapti2;  Chest- decr BS at bases, mild cough, rales right base, scat rhonchi, no consolidation;  Heart- RR gr1/6 SEM w/o r/g;  Abd- soft, neg;  Ext- 1+edema w/o c/c;  Neuro- intact w/o focal deficits...0  CXR 07/22/16>  Sl incr markings in medial LLL but no progressive opacity, effusions, etc...  Ambulatory oximetry 07/22/16>  O2sat=93% on RA at rest w/ pulse=61/min;  He ambulated 2 laps in office (185'ea) & stooped due to fatigue w/ lowest  O2sat=91% w/ pulse=73/min...  LABS 07/22/16>  Chems- ok w/ BS=89, Cr=1.18, LFTs wnl;  CBC- ok w/ Hg=15.3, WBC=13.6;  BNP=59;  Sed=18... IMP/PLAN>>  [name] appears to have had a viral pneumonia & his serology was pos for FluB; no bacterial etiology established;  This has flaired his COPD/ reactive airways component requiring reinstitution of his NEBS Tid, Symbicort160-2spBid, Singulair, and MUCINEX 1200mg  bid w/ fluids;  We placed him back on a slow Medrol taper for the airway inflammation;  Asked to rest at home w/ fluids but NO SALT & we plan rov recheck in 2-3 weeks... Note: >50% of this 40min ov was spent in counseling & coordination of care...            Problem List:     ALLERGIC to ASA w/ hives, & ACE inhibitors/ ARBs too w/ angioedema.  OBSTRUCTIVE SLEEP APNEA (ICD-327.23) - s/p UPPP surgery in 2004 by DrRedman...  states he's doing OK, denies snoring, no daytime hypersom, wife not complaining... uses ZYRTEK  for allergy symtoms. ~  1/13: He has started using the "breathe right nasal strips" qhs for recurrent snoring; still says he rests well, denies daytime hypersomnolence, & declines offer for repeat sleep study to check... ~  7/14:  He reports resting well, no daytime symptoms or sleep issues...  ~  8/15:  He denies sleep disordered breathing... ~  6/16:  He remains asymptomatic- no snoring, daytime hypersomnolence, etc...  Hx of BRONCHITIS, RECURRENT (ICD-491.9) - he is an ex-smoker, prev 1 ppd for 69yrs, quit in 2004. COPD, mixed type- w/ mild centrilobular emphysema on CT Chest 10/11, & chronic bronchitis clinically w/ recurrent infections... ~  Hx tiny RML nodule seen on Cardiac CT 2/11 & apparently unchanged from old Many 2004... ~  10/11: f/u CT Chest for f/u tiny 58mm nodule RML- unchanged xyrs & benign, mild centilob emphysema, +coronary calcif, NAD... ~  1/13: he denies breathing problems, intercurrent infections, etc... ~  1/14:  CXR 1/14 showed normal heart size, clear lungs,  chr right rib deformity (old fx), NAD.  ~  8/15: he was concerned about several recent bouts of bronchitis requiring ZPak for Rx> we checked labs and Immunoglobulins =>   Decided to start inhaler w/ DULERA100-2spBid...  ~  CXR 8/15 showed normal heart size, clear lungs w/o infiltrates, old healed right rib fxs, NAD. ~  PFT 8/15 showed FVC=2.32 (50%), FEV1=1.63 (45%), %1sec=70, mid-flows=33% predicted; suggestive of GOLD Stage 3 COPD but can't r/o superimposed restriction w/o lung volume measurement. ~  LABS 8/15 showed SPE/IEP w/o monoclonal gammopathy; Quant Ig's showed normal IgG, normal IgA, low IgM at 26 (40-250), and a hi IgE at 1362 => we will follow w/ RAST panel & consider Xolair Rx. ~  11/15: he didn't fill the [facility] due to cost- we decided to try SYMBICORT160-2spBid... ~  LABS 11/15 showed elev IgE level and mixed RAST panel allergens; discussed avoidance & he is a good cand for [employer] => insurance would NOT approve this med... ~  12/16: on Symbicort160-2spBid + using his mother's nebulizer +Albut; presented w/ refractory COPD exac=> treated w/ ZPak, Medrol taper, add Incruse & use NEB Tid, mucinex + Hycodan...  ~  1/17: on mother's NEB machine Bid, Symbicort160-2spBid, Incruse daily, Medrol8 taper- down to 1/2Qod, Hycodan prn;  We decided to continue the Medrol4mg Qod & institute a vigorous antireflux regimen- Protonix40 taken 98min before dinner, NPO after dinner, elev HOB 6". ~  3/17:  He is improved and backed off on many of his meds... ~  4/17:  He presents w/ recurrent AB => treated w/ Levaquin, Prednisone20mg - 7d tapering sched, NEBs w/ DUONEB Tid followed by Symbicort160 & Incruse... ~  6/17>  He had Allergy eval from DrKozlow... ?approved for Xolair by his co-pays were prohibitive... ~  3/18>  He was Adm w/ apparent FluB pneumonitis & acute hypoxemic resp failure => responded to O2, broad spectrum Ab's + Tamiflu, Solumedrol, NEBS, Symbicort, etc...   MEDICAL PROBLEMS PER DrPlotnikov  >>   HYPERTENSION (ICD-401.9) - on COREG 12.5mg Bid, AMLODIPINE 5mg /d, & LASIX 40mg - only as needed (& he hasn't needed any); BP= 116/72 & he denies CP, palpit, ch in SOB, edema...  ARTERIOSCLEROTIC HEART DISEASE (ICD-414.00) - on PLAVIX 75mg /d, allergic to ASA... followed by [facility] and seen 6/15 (note reviewed)... 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 ~  f/u Cardiac CT 2/11 showed>  Calcified plaque in the left main with < 50% stenosis.  Calcified plaque in the proximal  and mid LAD with at most moderate (around 50%) stenosis in the mid LAD. ~  Vasc Screen per Cards 10/11 showed mild carotid plaques, norm AbdAo & ABIs... ~  CATH 05/2016 by Vivi Barrack w/ 30-40% Lmain, nonobstructive dis in 1st diag 7 Circ, norm sys function 7 elev filling pressures=> med rx w/ Lasix40 added...  HYPERLIPIDEMIA (ICD-272.4) - on LIPITOR 40mg /d, & CoQ10... Last FLP 11/15 on Lip40 showed TChol 153, TG 42, HDL 53, LDL 92  DIABETES MELLITUS, BORDERLINE (ICD-790.29) - on diet alone... Labs 11/15- weight= 224#, BS=105, A1c=6.1  GERD (ICD-530.81) - he uses H2 blockers as needed.  DIVERTICULOSIS OF COLON (ICD-562.10) & COLONIC POLYPS (ICD-211.3) - his last polyps were ~80mm and removed in 2004= adenomatous... there is a +fam hx of colon cancer in his father who died at age 8... ~  Last colonoscopy 5/14 by DrKaplan (done w/ pt on Plavix rx)> mild divertics & 58mm polyp in desc colon removed (tubular adenoma) & repeat rec in 5 yrs.   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 ~  3/15: he is off the prev Testos replacement Rx> states he feels well, good energy, PSA=0.39  DEGENERATIVE JOINT DISEASE (ICD-715.90) - he reports doing fair- mostly c/o knees & hands ("I messed up my knee in a fall")... he saw Ortho ?who? on Glucosamine/ Chondroitin supplements.  LOW BACK PAIN SYNDROME (ICD-724.2)  MEMORY LOSS >>  ~  1/13: he states that  his "memory is fuzzy" & "Clarity is missing"; occas he will swear that he said something but he really didn't; he's had prev scans etc & I have recommended a Neurology eval for his memory but he declines at this time "I'll think about it" he said...  ANXIETY (ICD-300.00) - on WELLBUTRIN 150mg Bid, & ALPRAZOLAM 0.5mg  Prn... increased stress w/ mother's stroke last year... he's the main care giver w/ 2 sisters who don't help much...   Health Maintenance: ~  Immuniztions:  he is encouraged to get the yearly Flu vaccine... given PNEUMOVAX 2/12 & PREVNAR-13 given 11/15... He reports a lot of travel & he's been to the travel clinic & had "every vaccine known to man"> TDAP 7/14 and Zoster 5/14...   Past Surgical History:  Procedure Laterality Date  . CARDIAC CATHETERIZATION    . LEFT HEART CATH AND CORONARY ANGIOGRAPHY N/A 06/17/2016   Procedure: Left Heart Cath and Coronary Angiography;  Surgeon: Burnell Blanks, MD;  Location: [facility];  Service: Cardiovascular;  Laterality: N/A;  . UVULOPALATOPHARYNGOPLASTY     surgery for OSA    Outpatient Encounter Prescriptions as of 07/22/2016  Medication Sig  . albuterol (PROVENTIL) (2.5 MG/3ML) 0.083% nebulizer solution Use one vial in the nebulizer every 4-6 hours if needed for cough or wheeze  . ALPRAZolam (XANAX) 0.5 MG tablet Take 1 tablet (0.5 mg total) by mouth 3 (three) times daily as needed.  Marland Kitchen amLODipine (NORVASC) 5 MG tablet TAKE 1 TABLET BY MOUTH EVERY DAY  . ammonium lactate (LAC-HYDRIN) 12 % lotion Apply topically as directed. (Patient taking differently: Apply 1 application topically daily as needed for dry skin. )  . atorvastatin (LIPITOR) 40 MG tablet Take 1 tablet (40 mg total) by mouth daily.  . budesonide-formoterol (SYMBICORT) 160-4.5 MCG/ACT inhaler INHALE 2 PUFFS INTO THE LUNGS 2 (TWO) TIMES DAILY.  Marland Kitchen buPROPion (WELLBUTRIN SR) 150 MG 12 hr tablet Take 1 tablet (150 mg total) by mouth 2 (two) times daily.  . carvedilol  (COREG)  12.5 MG tablet TAKE 1 TABLET BY MOUTH TWICE A DAY WITH A MEAL  . cetirizine (ZYRTEC) 10 MG tablet Take 1 tablet (10 mg total) by mouth daily.  . Cholecalciferol (VITAMIN D3) 5000 UNITS CAPS Take one tablet by mouth daily  . clopidogrel (PLAVIX) 75 MG tablet TAKE 1 TABLET (75 MG TOTAL) BY MOUTH DAILY.  . clotrimazole-betamethasone (LOTRISONE) cream APPLY AS DIRECTED (Patient taking differently: Apply 1 application topically 2 (two) times daily as needed. APPLY AS DIRECTED)  . Coenzyme Q10 (COQ10) 100 MG CAPS Take as directed (Patient taking differently: Take 1 capsule by mouth daily. Take as directed)  . cyanocobalamin 1000 MCG tablet Take 1,000 mcg by mouth daily.  . furosemide (LASIX) 40 MG tablet TAKE 1 TABLET BY MOUTH EVERY DAY AS NEEDED  . guaiFENesin-dextromethorphan (ROBITUSSIN DM) 100-10 MG/5ML syrup Take 5 mLs by mouth every 4 (four) hours as needed for cough.  . montelukast (SINGULAIR) 10 MG tablet TAKE 1 TABLET BY MOUTH AT BEDTIME  . nitroGLYCERIN (NITROSTAT) 0.4 MG SL tablet PLACE 1 TABLET (0.4 MG TOTAL) UNDER THE TONGUE EVERY 5 (FIVE) MINUTES AS NEEDED. FOR UP TO 3 DOSES.  Marland Kitchen pantoprazole (PROTONIX) 40 MG tablet TAKE 1 TABLET BY MOUTH EVERY DAY 30 MINUTES BEFORE DINNERTIME  . saccharomyces boulardii (FLORASTOR) 250 MG capsule Take 1 capsule (250 mg total) by mouth 2 (two) times daily.  Marland Kitchen VIAGRA 100 MG tablet TAKE 1 TABLET BY MOUTH AS NEEDED FOR ERECTILE DYSFUNCTION.  . methylPREDNISolone (MEDROL) 8 MG tablet Take as directed  . [DISCONTINUED] doxycycline (VIBRA-TABS) 100 MG tablet Take 1 tablet (100 mg total) by mouth every 12 (twelve) hours. (Patient not taking: Reported on 07/22/2016)  . [DISCONTINUED] oseltamivir (TAMIFLU) 75 MG capsule Take 1 capsule (75 mg total) by mouth 2 (two) times daily. (Patient not taking: Reported on 07/22/2016)  . [DISCONTINUED] predniSONE (DELTASONE) 10 MG tablet Take 50 mg tablet on 07/17/2016, taper down by 10 mg daily until completed. (Patient not  taking: Reported on 07/22/2016)   No facility-administered encounter medications on file as of 07/22/2016.     Allergies  Allergen Reactions  . Ace Inhibitors Swelling  . Angiotensin Receptor Blockers Swelling  . Aspirin Hives  . Codeine Nausea And Vomiting  . Irbesartan Other (See Comments)    REACTION: allergic to ARBs w/ angioedema  . Ramipril Other (See Comments)    REACTION: Allergic to ACE's w/ angioedema...    Current Medications, Allergies, Past Medical History, Past Surgical History, Family History, and Social History were reviewed in [employer] record.    Review of Systems       See HPI - all other systems neg except as noted...       The patient complains of dyspnea on exertion and muscle weakness.  The patient denies anorexia, fever, weight loss, weight gain, vision loss, decreased hearing, hoarseness, chest pain, syncope, peripheral edema, prolonged cough, headaches, hemoptysis, abdominal pain, melena, hematochezia, severe indigestion/heartburn, hematuria, incontinence, suspicious skin lesions, transient blindness, difficulty walking, depression, unusual weight change, abnormal bleeding, enlarged lymph nodes, and angioedema.     Objective:   Physical Exam    WD, sl overweight, 66 y/o WM in NAD... GENERAL:  Alert & oriented; pleasant & cooperative... HEENT:  [place]/AT, EOM-wnl, PERRLA, EACs-clear, TMs-wnl, NOSE-clear, THROAT-s/p UPPP surg... NECK:  Supple w/ fairROM; no JVD; normal carotid impulses w/o bruits; no thyromegaly or nodules palpated; no lymphadenopathy. CHEST:  Decr BS at bases w/ few rales and rhonchi, no signs of consolidation... HEART:  Regular Rhythm; without murmurs/ rubs/ or gallops. ABDOMEN:  Soft & nontender; normal bowel sounds; no organomegaly or masses detected. EXT: without deformities, mild arthritic changes; no varicose veins/ venous insuffic/ or edema. Neuro:  intact w/o focal abn detected... DERM:  No lesions noted; no rash  etc...  RADIOLOGY DATA:  Reviewed in the EPIC EMR & discussed w/ the patient...   LABORATORY DATA:  Reviewed in the EPIC EMR & discussed w/ the patient...     Assessment & Plan:    Hx OSA, s/p UPPP in 2004>  Prev noted recurrent snoring & using breathe right nasal strips; he didn't want repeat sleep study or further eval, just want Rx for the strips so he can pay for them w/ his flex acct...   Hx recurrent bronchitis, underlying COPD (mixed type), COPD exac>   11/15> He quit smoking in 2004 & c/o recurrent bronchitic exacerbations=> PFT shows mod obstructive dis & we tried DULERA but too $$$ therefore switch to [id number] regularly w/ f/u PFTs later... 6/16> Xolair was denied by insurance despite his IgE level= 1400... 12/16> presented w/ refractory COPD exac; treated w/ ZPak, Medroltaper, Incruse, Mucinex, fluids, Hycodan => eventually improved...  3/17> he was improved and backed off on mult meds on his own accord... 4/17> recurrent AB symptoms prompted restart of Pred therapy, vigorous antireflux regimen, plus DUONEB Tid, Symbicort & Incruse 5/17> weaning Pred to 10-5 Qod schedule, continue other Rx, refer to DrKozlow for his input... 6/17> DrKozlow has made several adjustments & trying to get Xolair approved...  Medical issues per DrPlotnikov>  HBP, CAD s/pMI, VI/ edema, HL, IFG, GERD/ Divertics/ Polyps, GU/ Low-T, DJD/ LBP, Anxiety...    Patient's Medications  New Prescriptions   METHYLPREDNISOLONE (MEDROL) 8 MG TABLET    Take as directed  Previous Medications   ALBUTEROL (PROVENTIL) (2.5 MG/3ML) 0.083% NEBULIZER SOLUTION    Use one vial in the nebulizer every 4-6 hours if needed for cough or wheeze   ALPRAZOLAM (XANAX) 0.5 MG TABLET    Take 1 tablet (0.5 mg total) by mouth 3 (three) times daily as needed.   AMLODIPINE (NORVASC) 5 MG TABLET    TAKE 1 TABLET BY MOUTH EVERY DAY   AMMONIUM LACTATE (LAC-HYDRIN) 12 % LOTION    Apply topically as directed.   ATORVASTATIN  (LIPITOR) 40 MG TABLET    Take 1 tablet (40 mg total) by mouth daily.   BUDESONIDE-FORMOTEROL (SYMBICORT) 160-4.5 MCG/ACT INHALER    INHALE 2 PUFFS INTO THE LUNGS 2 (TWO) TIMES DAILY.   BUPROPION (WELLBUTRIN SR) 150 MG 12 HR TABLET    Take 1 tablet (150 mg total) by mouth 2 (two) times daily.   CARVEDILOL (COREG) 12.5 MG TABLET    TAKE 1 TABLET BY MOUTH TWICE A DAY WITH A MEAL   CETIRIZINE (ZYRTEC) 10 MG TABLET    Take 1 tablet (10 mg total) by mouth daily.   CHOLECALCIFEROL (VITAMIN D3) 5000 UNITS CAPS    Take one tablet by mouth daily   CLOPIDOGREL (PLAVIX) 75 MG TABLET    TAKE 1 TABLET (75 MG TOTAL) BY MOUTH DAILY.   CLOTRIMAZOLE-BETAMETHASONE (LOTRISONE) CREAM    APPLY AS DIRECTED   COENZYME Q10 (COQ10) 100 MG CAPS    Take as directed   CYANOCOBALAMIN 1000 MCG TABLET    Take 1,000 mcg by mouth daily.   FUROSEMIDE (LASIX) 40 MG TABLET    TAKE 1 TABLET BY MOUTH EVERY DAY AS NEEDED   GUAIFENESIN-DEXTROMETHORPHAN (ROBITUSSIN DM) 100-10 MG/5ML SYRUP  Take 5 mLs by mouth every 4 (four) hours as needed for cough.   MONTELUKAST (SINGULAIR) 10 MG TABLET    TAKE 1 TABLET BY MOUTH AT BEDTIME   NITROGLYCERIN (NITROSTAT) 0.4 MG SL TABLET    PLACE 1 TABLET (0.4 MG TOTAL) UNDER THE TONGUE EVERY 5 (FIVE) MINUTES AS NEEDED. FOR UP TO 3 DOSES.   PANTOPRAZOLE (PROTONIX) 40 MG TABLET    TAKE 1 TABLET BY MOUTH EVERY DAY 30 MINUTES BEFORE DINNERTIME   SACCHAROMYCES BOULARDII (FLORASTOR) 250 MG CAPSULE    Take 1 capsule (250 mg total) by mouth 2 (two) times daily.   VIAGRA 100 MG TABLET    TAKE 1 TABLET BY MOUTH AS NEEDED FOR ERECTILE DYSFUNCTION.  Modified Medications   No medications on file  Discontinued Medications   DOXYCYCLINE (VIBRA-TABS) 100 MG TABLET    Take 1 tablet (100 mg total) by mouth every 12 (twelve) hours.   OSELTAMIVIR (TAMIFLU) 75 MG CAPSULE    Take 1 capsule (75 mg total) by mouth 2 (two) times daily.   PREDNISONE (DELTASONE) 10 MG TABLET    Take 50 mg tablet on 07/17/2016, taper down by 10  mg daily until completed.

## 2016-07-23 ENCOUNTER — Ambulatory Visit: Payer: Medicare Other | Admitting: Physician Assistant

## 2016-08-10 ENCOUNTER — Encounter: Payer: Self-pay | Admitting: Physician Assistant

## 2016-08-10 NOTE — Progress Notes (Signed)
Cardiology Office Note    Date:  08/12/2016  ID:  Larry, Stone 04/29/1950, MRN [id number] PCP:  [name], MD  Cardiologist:  Angelena Form   Chief Complaint: f/u cath  History of Present Illness:  Larry Stone is a 66 y.o. male with history of CAD, HLD, HTN, OSA, thrombocytopenia, chronic diastolic CHF, GERD, obesity here today for cardiac follow up. He has a remote history of acute inf-lat MI in 2004 while in [place] - treated at [facility]. Per Larry Stone note he had a 50% LM and nonobstructive disease otherwise. There is an old discharge summary from 2004 from [facility] indicating possible occlusion of the circumflex at time of MI and possible angioplasty but this is not clear. Cardiac CT in 05/2009 with LM < 50% LAD 50% LCX dominant nonobs RCA small no critical lesions. Small lung nodule. F/u CT showed stable pulm nodule. Carotid u/s 11/11 with minimal carotid plaque. Last stress myoview 10/03/13 with inferolateral scar, no ischemia, LVEF=52%. Recently underwent cath 06/17/16 showing mild nonobstructive CAD with 30-40% ostial LM (eccentric), elevated LV filling pressures and normal LV function. He was started on Lasix for his elevated filling pressures. Most recent labs showed normal BNP, sed rate, K 4.3, Hgb 15.3, Cr 1.18 (at baseline) (07/2016). More recently he was admitted 07/16/16 with sepsis, acute respiratory failure with hypoxia, multilobar PNA, and influenza B. Last LDL 72 in 03/2016.  He returns for follow-up doing well. His weight is down 8lb from last cardiology visit. He reports his endurance is steadily improving. Says he used to "feel like he was going to die" when he walked 30 seconds but is now back to walking brief spurts on the treadmill throughout the day. No chest pain. + intermittent edema when standing on his feet for long periods of time, but none today. Does eat chinese food from time to time. Is looking forward to a trip to [place] soon. Has  f/u with pulm/PCP Dr. Lenna Stone today.    Past Medical History:  Diagnosis Date  . Anxiety   . CAD    a. acute inferior lateral wall infarction in September 2004 treated medically. b.  cath 06/17/16 showing mild nonobstructive CAD with 30-40% ostial LM (eccentric), elevated LV filling pressures and normal LV function.  . Chronic diastolic CHF (congestive heart failure) ([place])   . COPD (chronic obstructive pulmonary disease) ([place])   . Diabetes mellitus   . Diverticulosis   . DJD (degenerative joint disease)   . GERD (gastroesophageal reflux disease)   . HTN (hypertension)   . Hyperlipidemia   . Low back pain syndrome   . Myocardial infarction ([place]) 2004  . Obesity   . OSA (obstructive sleep apnea)   . Recurrent aspiration bronchitis/pneumonia ([place])   . Recurrent aspiration pneumonia ([place])    Larry Stone 07/13/2016  . Thrombocytopenia ([place])   . Tubular adenoma of colon 2014    Past Surgical History:  Procedure Laterality Date  . CARDIAC CATHETERIZATION    . LEFT HEART CATH AND CORONARY ANGIOGRAPHY N/A 06/17/2016   Procedure: Left Heart Cath and Coronary Angiography;  Surgeon: Burnell Blanks, MD;  Location: [facility];  Service: Cardiovascular;  Laterality: N/A;  . UVULOPALATOPHARYNGOPLASTY     surgery for OSA    Current Medications: Current Outpatient Prescriptions  Medication Sig Dispense Refill  . albuterol (PROVENTIL) (2.5 MG/3ML) 0.083% nebulizer solution Use one vial in the nebulizer every 4-6 hours if needed for cough or wheeze 225  mL 1  . ALPRAZolam (XANAX) 0.5 MG tablet Take 1 tablet (0.5 mg total) by mouth 3 (three) times daily as needed. 90 tablet 3  . amLODipine (NORVASC) 5 MG tablet TAKE 1 TABLET BY MOUTH EVERY DAY 90 tablet 1  . ammonium lactate (LAC-HYDRIN) 12 % lotion Apply topically as directed. (Patient taking differently: Apply 1 application topically daily as needed for dry skin. ) 400 g 3  . atorvastatin (LIPITOR) 40 MG tablet Take 1 tablet (40 mg total)  by mouth daily. 90 tablet 0  . budesonide-formoterol (SYMBICORT) 160-4.5 MCG/ACT inhaler INHALE 2 PUFFS INTO THE LUNGS 2 (TWO) TIMES DAILY. 3 Inhaler 3  . buPROPion (WELLBUTRIN SR) 150 MG 12 hr tablet TAKE 1 TABLET BY MOUTH TWICE A DAY 180 tablet 0  . carvedilol (COREG) 12.5 MG tablet TAKE 1 TABLET BY MOUTH TWICE A DAY WITH A MEAL 180 tablet 0  . cetirizine (ZYRTEC) 10 MG tablet Take 1 tablet (10 mg total) by mouth daily. 30 tablet 11  . Cholecalciferol (VITAMIN D3) 5000 UNITS CAPS Take one tablet by mouth daily    . clopidogrel (PLAVIX) 75 MG tablet TAKE 1 TABLET (75 MG TOTAL) BY MOUTH DAILY. 90 tablet 3  . clotrimazole-betamethasone (LOTRISONE) cream APPLY AS DIRECTED (Patient taking differently: Apply 1 application topically 2 (two) times daily as needed. APPLY AS DIRECTED) 45 g 11  . Coenzyme Q10 (COQ10) 100 MG CAPS Take as directed (Patient taking differently: Take 1 capsule by mouth daily. Take as directed) 30 each 11  . cyanocobalamin 1000 MCG tablet Take 1,000 mcg by mouth daily.    . furosemide (LASIX) 40 MG tablet TAKE 1 TABLET BY MOUTH EVERY DAY AS NEEDED 90 tablet 3  . montelukast (SINGULAIR) 10 MG tablet TAKE 1 TABLET BY MOUTH AT BEDTIME 90 tablet 0  . nitroGLYCERIN (NITROSTAT) 0.4 MG SL tablet PLACE 1 TABLET (0.4 MG TOTAL) UNDER THE TONGUE EVERY 5 (FIVE) MINUTES AS NEEDED. FOR UP TO 3 DOSES. 25 tablet 3  . pantoprazole (PROTONIX) 40 MG tablet TAKE 1 TABLET BY MOUTH EVERY DAY 30 MINUTES BEFORE DINNERTIME 90 tablet 0  . VIAGRA 100 MG tablet TAKE 1 TABLET BY MOUTH AS NEEDED FOR ERECTILE DYSFUNCTION. 15 tablet 0   No current facility-administered medications for this visit.      Allergies:   Ace inhibitors; Angiotensin receptor blockers; Aspirin; Codeine; Irbesartan; and Ramipril   Social History   Social History  . Marital status: Married    Spouse name: N/A  . Number of children: 1  . Years of education: N/A   Occupational History  . [employer] History Main Topics  . Smoking status: Former Smoker    Packs/day: 1.00    Years: 20.00    Types: Cigarettes, Cigars    Quit date: 05/08/1998  . Smokeless tobacco: Never Used     Comment: quit in 2005  . Alcohol use 0.6 oz/week    1 Standard drinks or equivalent per week     Comment: social drinker  . Drug use: No  . Sexual activity: Yes   Other Topics Concern  . None   Social History Narrative  . None     Family History:  Family History  Problem Relation Age of Onset  . Colon cancer Father   . Stroke Mother   . Heart disease Mother   . Hypertension Sister   . Diabetes Sister   . Colon cancer Paternal Uncle  ROS:   Please see the history of present illness. All other systems are reviewed and otherwise negative.    PHYSICAL EXAM:   VS:  BP 118/72   Pulse 60   Ht 5\' 10"  (1.778 m)   Wt 214 lb (97.1 kg)   BMI 30.71 kg/m   BMI: Body mass index is 30.71 kg/m. GEN: Well nourished, well developed WM in no acute distress  HEENT: normocephalic, atraumatic Neck: no JVD, carotid bruits, or masses Cardiac: RRR; no murmurs, rubs, or gallops, no edema  Respiratory: few scattered crackles in bases, otherwise clear to auscultation bilaterally, normal work of breathing GI: soft, nontender, nondistended, + BS MS: no deformity or atrophy  Skin: warm and dry, no rash. Right radial cath site without hematoma or ecchymosis; good pulse. Neuro:  Alert and Oriented x 3, Strength and sensation are intact, follows commands Psych: euthymic mood, full affect  Wt Readings from Last 3 Encounters:  08/12/16 214 lb (97.1 kg)  07/22/16 213 lb 4 oz (96.7 kg)  07/16/16 216 lb 9.6 oz (98.2 kg)      Studies/Labs Reviewed:   EKG:   EKG was not ordered today.  Recent Labs: 03/30/2016: TSH 1.78 07/13/2016: B Natriuretic Peptide 213.7 07/22/2016: ALT 38; BUN 25; Creatinine, Ser 1.18; Hemoglobin 15.3; Platelets 192.0; Potassium 4.3; Pro B Natriuretic peptide (BNP) 59.0; Sodium  138   Lipid Panel    Component Value Date/Time   CHOL 128 03/30/2016 1056   TRIG 59.0 03/30/2016 1056   HDL 44.30 03/30/2016 1056   CHOLHDL 3 03/30/2016 1056   VLDL 11.8 03/30/2016 1056   LDLCALC 72 03/30/2016 1056    Additional studies/ records that were reviewed today include: Summarized above.    ASSESSMENT & PLAN:   1. CAD - stable by recent cath. Dyspnea improving. Continue current regimen. Cath site is stable. 2. Essential HTN - controlled on current regimen. He reports he was previously told by another doctor to consider stopping amlodipine but says Dr. Angelena Form felt this was a good medicine on his regimen. The patient himself is very pleased with how he feels on current meds so I will not make any changes. 3. Chronic diastolic CHF - recent diagnoses supported by elevated filling pressures at cath. Normal LV function. We had long discussion regarding daily weights, low sodium diet, and observation for further symptoms. His weight is actually down and he has no edema. He does have faint crackles at bases but these were noted on visit 4/5 with pulm as well at which time BNP was normal and there was some residual pulmonary opacities. Would continue Lasix as he is doing (40mg  every other day at this point) and f/u with pulm as planned. He will notify our office of any recurrent symptoms. 4. Hyperlipidemia - continue statin.   Disposition: F/u with Dr. Angelena Form in 4 months.   Medication Adjustments/Labs and Tests Ordered: Current medicines are reviewed at length with the patient today.  Concerns regarding medicines are outlined above. Medication changes, Labs and Tests ordered today are summarized above and listed in the Patient Instructions accessible in Encounters.   Raechel Ache PA-C  08/12/2016 10:12 AM    [address], [place], [place]  [address] Phone: [phone]; Fax: [phone]

## 2016-08-11 ENCOUNTER — Ambulatory Visit: Payer: Self-pay | Admitting: Internal Medicine

## 2016-08-12 ENCOUNTER — Ambulatory Visit (INDEPENDENT_AMBULATORY_CARE_PROVIDER_SITE_OTHER): Payer: Medicare Other | Admitting: Pulmonary Disease

## 2016-08-12 ENCOUNTER — Encounter: Payer: Self-pay | Admitting: Physician Assistant

## 2016-08-12 ENCOUNTER — Other Ambulatory Visit: Payer: Self-pay | Admitting: Cardiovascular Disease

## 2016-08-12 ENCOUNTER — Ambulatory Visit (INDEPENDENT_AMBULATORY_CARE_PROVIDER_SITE_OTHER): Payer: Medicare Other | Admitting: Physician Assistant

## 2016-08-12 ENCOUNTER — Encounter: Payer: Self-pay | Admitting: Pulmonary Disease

## 2016-08-12 VITALS — BP 118/72 | HR 60 | Ht 70.0 in | Wt 214.0 lb

## 2016-08-12 VITALS — BP 122/70 | HR 62 | Temp 96.8°F | Ht 70.0 in | Wt 213.1 lb

## 2016-08-12 DIAGNOSIS — E785 Hyperlipidemia, unspecified: Secondary | ICD-10-CM

## 2016-08-12 DIAGNOSIS — J449 Chronic obstructive pulmonary disease, unspecified: Secondary | ICD-10-CM | POA: Diagnosis not present

## 2016-08-12 DIAGNOSIS — I5032 Chronic diastolic (congestive) heart failure: Secondary | ICD-10-CM

## 2016-08-12 DIAGNOSIS — I2510 Atherosclerotic heart disease of native coronary artery without angina pectoris: Secondary | ICD-10-CM

## 2016-08-12 DIAGNOSIS — J4489 Other specified chronic obstructive pulmonary disease: Secondary | ICD-10-CM

## 2016-08-12 DIAGNOSIS — I10 Essential (primary) hypertension: Secondary | ICD-10-CM | POA: Diagnosis not present

## 2016-08-12 MED ORDER — METHYLPREDNISOLONE 4 MG PO TABS: ORAL_TABLET | ORAL | 0 refills | Status: DC

## 2016-08-12 MED ORDER — LEVOFLOXACIN 500 MG PO TABS: 500.0000 mg | ORAL_TABLET | Freq: Every day | ORAL | 0 refills | Status: DC

## 2016-08-12 MED ORDER — ALBUTEROL SULFATE HFA 108 (90 BASE) MCG/ACT IN AERS: 1.0000 | INHALATION_SPRAY | Freq: Four times a day (QID) | RESPIRATORY_TRACT | 11 refills | Status: DC | PRN

## 2016-08-12 NOTE — Progress Notes (Signed)
Subjective:    Patient ID: Larry Stone Stone, male    DOB: [birth year], 66 y.o.   MRN: [id number]  HPI 66 y/o WM here for a followup visit... he has multiple medical problems as noted below...  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 and DrPlotnikov for Primary Care- Chol, Impaired fasting glucose, etc... ~  SEE PREV EPIC NOTES FOR OLDER DATA >>    CT Chest 01/2010>>  1.43mm right middle lobe lung nodule is similar back to the abdominal CT of 02/16/2003, consistent with benignity, most likely a subpleural lymph node;  2. No acute process in the chest;  3 Distal esophageal wall thickening again suggests esophagitis;  4.Mild centrilobular emphysema;  5.Age advanced atherosclerosis.  CXR 1/14 showed normal heart size, clear lungs, chr right rib deformity (old fx), NAD...  LABS 1/14:  FLP- at goals on Lip40+Niasp;  Chems- wnl;  CBC- wnl;  TSH=1.93;  PSA=0.39;  Testos=307 (350-890) on Testim 2tubes/d;  B12=1005 (211-911) on B12 2052mcg/d...  LABS 4/15:  FLP- at goals on Lip40;  Chems- ok w/ BS=115, A1c=6.0, Cr=1.2;  CBC- wnl;  TSH=1.55;  PSA=0.39...   CXR 8/15 showed normal heart size, clear lungs w/o infiltrates, old healed right rib fxs, NAD...   PFT 8/15 showed FVC=2.32 (50%), FEV1=1.63 (45%), %1sec=70, mid-flows=33% predicted; suggestive of GOLD Stage 3 COPD but can't r/o superimposed restriction w/o lung volume measurement...  LABS 8/15:  SPE/IEP w/o monoclonal gammopathy; Quant Ig's showed normal IgG, normal IgA, low IgM at 26 (40-250), and a hi IgE at 1362 => we will follow w/ RAST panel & consider Xolair Rx...  RAST Panel 11/15 => returned w/ IgE level = 1497 and Allergen panel pos for some molds, grasses, trees, ragweed, cats>dogs; and Food panel pos for Milk> Wheat> Shrimp/ Tomato/ Peanut, etc.  ~  March 18, 2014:  33mo ROV & f/u dyspnea> Larry Stone Stone states that the [facility] was  $400 at the Pharm & they/he didn't call for replacement inhaler, states he filled it one time & ran out several wks ago, we discussed what to do in that eventuality (call us to let us know & get a different Rx), we will try SYMBICORT160-2spBid & he will let us know what his [employer] co-pay is for this med;  Similarly he did not return to get the RAST tests done, but he informs me that they were drawn several days ago w/ blood work for [employer]- results pending...  We reviewed his prev Hx/ symptoms, exam (chest is clear today), and evaluation; all questions answered, we decided to try the Symbicort160 regularly over the next 27mo & plan f/u Full-PFTs in the future;  In the meanwhile we will await the results of the RAST tests and decide regarding Xolair vs Allergy evaluation...    We reviewed prob list, meds, xrays and labs> see below for updates >> He was given the 2015 flu vaccine recently and the Prevnar-13 vaccination as well...  LABS 11/15:  FLP- at goals on Lip40;  Chems- wnl w/ BS=105, A1c=6.1;  CBC- wnl w/ Hg=15.1;  TSH=1.77, PSA=0.50...   RAST Test 11/15:  returned w/ IgE level = 1497 and Allergen panel pos for some molds, grasses, trees, ragweed, cats>dogs; and Food panel pos for Milk> Wheat> Shrimp/ Tomato/ Peanut, etc... We discussed avoidance strategies and other options- he is interested in Xolair & we will proceed w/ the paperwork for Xolair  approval...   ~  October 15, 2014:  30mo ROV & Larry Stone Stone is still awaiting insurance approval for Xolair!  He reports removing 2 ticks from his privates recently!  Weight down 12# on diet and he notes some improvement in his dyspnea- now just noted w/ intense activ he says eg- climbing/ stairs/ etc but walking is ok...     OSA> s/p UPPP surg 2004; uses Transderm scope prn dizziness, uses "breathe right nasal strips" periodically; resting OK, no daytime hypersomnolence...    COPD & HxBronchitis> on Symbicort160-2Bid & Zyrtek prn; doing satis w/o cough,  phlegm, etc...    Elev IgE at 1362 & 1497; see RAST results above;  We are awaiting approval for Xolair...    His last Cards check was 12/25 w/ DrMcAlhany> Hx MI in 2004 w/ resid 50% Lmain; had CardiacCT 2011 w/ 50% LAD, Myoview 2015 w/ inferolat scar, no ischemia, EF=52%; clinically stable & not c/o CP & no changes made...    He had medical f/u DrPlotnikov 5/16> HBP, CAD, HL, anxiety- all stable on meds and no changes made...    He continues to f/u w/ Podiatry- DrRegal> seen 4/16 for heel pain, plantar faciitis- injected & given brace + oral anti-inflamm rx... We reviewed prob list, meds, xrays and labs> see below for updates >>  ADDENDUM>>  [name] was denied by his insurance company...  ~  April 04, 2015:  2mo ROV & Larry Stone called for an add-on appt >  He is c/o a 7mo hx sudden onset SOB/DOE, fatigue, cough & chest congestion w/ sl beige phlegm, no hemoptysis, no CP x sore from coughing, and no f/c/s;  We called in Pred dosepak but he says no better, not resting, ?cough worse Qhs, incr SOB & he denies any reflux/ GERD, etc... He has been using the Symbicort160-2spBid & Zyrtek10... EXAM shows Afeb, VSS, O2sat=98% on RA;  HEENT- neg, mallamapti2;  Chest- decr BS at bases, dry cough, clear w/o w/r/r;  Heart- RR w/o m/r/g;  Abd- soft, neg;  Ext- neg w/o c/c/e...   CXR 04/04/15 showed norm heart size, clear lungs, old right rib fx, NAD...  IMP/PLAN>>  Larry Stone Stone appears to have a refractory COPD exac & we discussed additional treatment w/ ZPak, Depo80/Medrol8mg  tapering sched, add INCRUSE one sp daily, Mucinex 1200mg Bid + fluids, and OK to use mother's old NEB machine  W/ Albut Tid;  Also wrote for [facility] for prn use...  ~  May 01, 2015:  22mo ROV & Larry Stone reports that he is better overall from his refractory AB but cough is lingering w/ a definite night-time pattern;  Notes some wheezing & chest soreness;  He has been on mother's NEB machine Bid, Symbicort160-2spBid, Incruse daily, Medrol8 taper- down to  1/2Qod, Hycodan prn;  We decided to continue the Medrol4mg Qod & institute a vigorous antireflux regimen- Protonix40 taken 57min before dinner, NPO after dinner, elev HOB 6"... EXAM shows Afeb, VSS, O2sat=98% on RA;  HEENT- neg, mallamapti2;  Chest- decr BS at bases, min cough & exp wheezing w/o consolidation;  Heart- RR w/o m/r/g;  Abd- soft, neg;  Ext- neg w/o c/c/e...  IMP/PLAN>>  Larry Stone Stone is improved but w/ persist symptoms esp cough at night- we decided to continue the Medrol4mg  Qod & institute a vigorous antireflux regimen... Plan ROV 6-8wks.  ~  June 30, 2015:  5mo ROV & Larry Stone Stone reports that his breathing is better, min chest congestion & non-productive cough, still notes occas wheezing; no CP, no tightness, no f/c/s;  NOTE>  HE HAS STOPPED THE MEDROL& STOPPED THE NEBS on his own, still using the Symbicort & Incruse regularly; rec to add MUCINEX600-2Bid w/ fluids & [facility]...  EXAM shows Afeb, VSS, O2sat=98% on RA;  HEENT- clears throat/ ?VCD, mallamapti2;  Chest- decr BS at bases, min cough & exp wheezing w/o consolidation;  Heart- RR w/o m/r/g;  Abd- soft, neg;  Ext- neg w/o c/c/e...  IMP/PLAN>>  We will refer to ENT for completeness & upper airway symptoms, throat clearing, etc; he needs to maintain a vigorous antireflux regimen + Symbicort/ Incruse/ Albut NEBS...   ~  August 12, 2015:  6wk ROV & add-on appt requested due to incr dyspnea>  Larry Stone Stone reports that he was doing satis on the Symbicort160-2spBid & Incruse daily (having prev stopped the NEB rx and Medrol) but called yest w/ temp 103 he says, felt "out of it" w/ chills, aching/sore, fatigued/ weak and incr cough w/o much sput production;  He says he saw ENT in the interim- nothing wrong, no change in rx recommended (we do not have note from them);  Note- his wt is down 11# to 211# today...    OSA> s/p UPPP surg 2004; uses Transderm scope prn dizziness, uses "breathe right nasal strips" periodically; resting OK, no  daytime hypersomnolence...    COPD & HxBronchitis> on Symbicort160-2Bid & Incruse daily; has his mother's old NEB machine & using Albut prn;  Now c/o dry cough, T103, etc (as above)=>     Elev IgE at 1362 & 1497; see RAST results above;  His insurance denied Xolair rx; he has been on steroids and uses Zyrtek10...    His last Cards check was 05/08/15 w/ DrMcAlhany> Hx MI in 2004 w/ resid 50% Lmain; had CardiacCT 2011 w/ 50% LAD, Myoview 2015 w/ inferolat scar, no ischemia, EF=52%;  Myoview 6/15 showed inferolat scarno ischemia, EF=52%; clinically stable & not c/o CP & no changes made...    He had medical f/u DrPlotnikov 5/16> HBP, CAD, HL, anxiety- all stable on meds and no changes made...    He continues to f/u w/ Podiatry- DrRegal> seen 4/16 for heel pain, plantar faciitis- injected & given brace + oral anti-inflamm rx... EXAM shows Afeb, VSS, O2sat=92% on RA;  HEENT- clears throat/ ?VCD, mallamapti2;  Chest- decr BS at bases, min cough & no w/r/r/ or consolidation;  Heart- RR w/o m/r/g;  Abd- soft, neg;  Ext- neg w/o c/c/e...   CXR 08/12/15> norm heart size, no acute pulm infiltrates/ effusion/ etc, right lat pleural thickening; degen changes in spine...  LABS 08/12/15> Chems- wnl  X BS=121, Cr=1.2, TBili=1.8;  BNP= 35;  CBC- ok x WBC=15.7, Hg=14.9;  Sed=41; IMP/PLAN>>  We decided to treat w/ LEVAQUIN x7d, Depo80, PRED20mg - 7d tapering sched; use NEB w/ DUONEB Tid followed by Symbicort & Incruse; continue to follow the vigorous antireflux regimen; ROV in 3 weeks time...  ~  Sep 03, 2015:  3wk ROV & Larry Stone Stone reports some improvement on his regimen of PRED20mg -1/2 tab Qam now, NEBs w/ Duoneb Tid, Symbicort160-2spBid, Incruse daily; he notes "improved, but not well" w/ CC= dry cough, in paroxysms, hacking, small amt thick sputum, no f/c/s, no CP, no SOB x w/ the coughing spells; he notes that he coughs in restaurants, notes the coughing spells occur w/o rhyme or reason, better if he wals outside...  EXAM shows  Afeb, VSS, O2sat=98% on RA;  HEENT- neg, mallamapti2;  Chest- decr BS at bases, clear, no w/r/r/ or  consolidation;  Heart- RR w/o m/r/g;  Abd- soft, neg;  Ext- neg w/o c/c/e...  IMP/PLAN>>  Larry Stone Stone needs a new NEBULIZER machine; he is asked to wean the Pred from 10mg /d to 10mg  alternating w/ 5mg  QOD; contuinue the Duoneb tid, Symbicort-2Bid, Incruse daily, Hycodan prn; he needs to redouble his efforts at the antireflux regimen;  We will refer to DrKozlow for his assessment of refractory asthmatic bronchitis- recall the RAST was abn 2015 & IgE level ~1400 but insurance denied Xolair... NOTE>>  We gave him the second Pneumovax 23 today & this gets him up-to-date on his immuniz.  ~  October 15, 2015:  6wk ROV & Larry Stone Stone has established w/ DrKozlow for allergy/immunology work up & to try & get him approved for Xolair;  He has done quite a bit of reading on the internet & asked several good questions about his condition- answered to the best of my ability;  Currently taking Symbicort160-2spBid, Incruse once daily, Pred 10mg  tabs tapering (tapering off more quickly on DrKozlow's protocol), Singulair10, NEBS w/ ?Albut2.5 prn (we had him on Duoneb Tid), Zyrtek10, Hycodan prn;  DrKozlow has started the paperwork for Xolair shots thru his new insurance;  He reports that his breathing is better- sl cough, sm amt beige sput, no hemoptysis, improved SOB, no CP, no recent f/c/s, etc...    EXAM shows Afeb, VSS, O2sat=97% on RA;  HEENT- clears throat min/ ?VCD, mallamapti2;  Chest- decr BS at bases, no cough & no w/r/r;  Heart- RR w/o m/r/g;  Abd- soft, neg;  Ext- neg w/o c/c/e...   Spirometry x2 by DrKozlow 6/13 & 6/27 showed FEV1 improved 33% to 1.98L (61% pred) on his regimen...  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 IMP/PLAN>>  COPD w/ Asthma, improved w/ adjustments from DrKozlow, they are continuing his COPD meds Symbicort/ Incruse), added Singulair, and changed Duoneb to albut prn; he has  also highlighted the need for anti-reflux regimen (ProtonixAM & Ranitadine-PM); hoping for approval of Xolair, they plan f/u in 2-75mo and he will call us in the interim for any issues...  ~  July 22, 2016:  24mo ROV & post hospital visit>  I last saw Larry Stone Stone ~27mo ago- see above, he has established w/ DrKozlow for an allergy/ immunology work up & DrPlotnikov for PCP>>    DrKozlow's eval revealed mixed COPD/Emphysema w/ revers (asthmatic) component, allergic rhinitis, LPRD; he weaned Larry Stone Stone off Pred, continued Symbicort, stopped Incruse, continued Singulair/ Rhinocort, AlbutHFA & had him stop the NEBS; they submitted for Xolair but cost from his insurance was prohibitive, continued his antireflux regimen & allergen avoidance etc... Pt did not f/u w/ DrKozlow after 12/2015 visit...     Pt had general medical check w/DdrPlotnikov [id number]- notes reviewed...    Then in Feb2018 he saw DrMcAlhany in [place] for [place]- known HBP, CAD- s/p IWMI 2004 w/ med rx, HL;  Notes reviewed- pt had CATH 06/17/16 showing nonobstructive CAD w/ 30-40% Lmain; norm LV sys function, elev filling pressures=> started Lasix40 daily... He hasn't seen Cards back in the office yet...    Then he saw PCP back 06/30/16> ?what was going on? c/o arthralgias? They stopped his Symbicort?    He presented to the ER 07/13/16 w/ dry cough, incr SOB, wheezing, hypoxemic and Temp to 103;  He was felt to be septic, given 3L IVF, no sputum, BC neg, only pos titer was FluB; Treated w/ O2, Levaquin/ Vanco empirically + Tamiflu &  Solumedrol;  CXR showed sl incr markings, no infiltrate; subseq CT Chest showed bilat patchy GGO thought c/w infection;  He was disch home on 3/30 w/ Doxy, Tamiflu, Pred10/d, NEBS, Symbicort160, Singulair & GFN...   CXR 07/13/16 showed decr lung vols, mild peribronch cuffing but no consolidative airsp dis, +Ao atherosclerosis, otherw neg...  EKG 07/13/16>  NSR, rate92, RBBB, no acute changes...  CT Chest 07/14/16 showed norm heart size &  Ao and coronary atherosclerosis, no pathologically enlarged LNs, patchy multifocal GGO in RML, RLL, LLL, no masses, upper abd & bones all neg...  LABS 06/2016>  Chems- ok x BS120-160, Cr was ok at 1.00;  Lactate was elev at 2.77 & PCT was elev at 0.77;  CBC was ok w/ Hg=12.2, mcv=94, WBC=10.2    OSA> s/p UPPP surg 2004; not on CPAP; uses Transderm scope prn dizziness, uses "breathe right nasal strips" periodically; resting OK, no daytime hypersomnolence...    COPD w/ revers component & Hx recurrent bronchitic infections in the past> DrKozlow had stopped most of his meds in Sep2017, DrPlot stopped Symbicort 12/17; he developed FluB pneumonitis 3/18 as above=> still feeling weak, fatigued, cough w/ sm amt yellow sput, SOB/DOE w/ ADLs now; no f/c/s, no CP/ palpit, 1+edema in LEs...    Elev IgE at 1362 & 1497; see RAST results;  His 1st insurance denied Xolair rx; 2nd insurance approved it but co-pay was too high...    CARDS issues> followed by Vivi Barrack- Hx MI in 2004 w/ resid 50% Lmain; had CardiacCT 2011 w/ 50% LAD, Myoview 2015 w/ inferolat scar, no ischemia, EF=52%;  Cathed 06/2016 for incr DOE w/ 30-40% Lmain otherw nonobstructive dis, norm sys function but elev filling pressures=> DrMcAlhany started Lasix40 but this was stopped w/ [facility] 3/18...    Medical issues> followed by DrPlotnikov- HBP, CAD, HL, anxiety    He continues to f/u w/ Podiatry- DrRegal> seen 4/16 for heel pain, plantar faciitis- injected & given brace + oral anti-inflamm rx...    EXAM shows Afeb, VSS, O2sat=93% on RA;  HEENT- s/p UPPP, ?VCD, mallamapti2;  Chest- decr BS at bases, mild cough, rales right base, scat rhonchi, no consolidation;  Heart- RR gr1/6 SEM w/o r/g;  Abd- soft, neg;  Ext- 1+edema w/o c/c;  Neuro- intact w/o focal deficits...  CXR 07/22/16>  Sl incr markings in medial LLL but no progressive opacity, effusions, etc...  Ambulatory oximetry 07/22/16>  O2sat=93% on RA at rest w/ pulse=61/min;  He ambulated 2 laps in office  (185'ea) & stooped due to fatigue w/ lowest O2sat=91% w/ pulse=73/min...  LABS 07/22/16>  Chems- ok w/ BS=89, Cr=1.18, LFTs wnl;  CBC- ok w/ Hg=15.3, WBC=13.6;  BNP=59;  Sed=18... IMP/PLAN>>  Larry Stone Stone appears to have had a viral pneumonia & his serology was pos for FluB; no bacterial etiology established;  This has flared his COPD/ reactive airways component requiring reinstitution of his NEBS Tid, Symbicort160-2spBid, Singulair, and MUCINEX 1200mg  bid w/ fluids;  We placed him back on a slow Medrol taper for the airway inflammation;  Asked to rest at home w/ fluids but NO SALT & we plan rov recheck in 2-3 weeks... Note: >50% of this 36min ov was spent in counseling & coordination of care...   ~  August 12, 2016:  3wk ROV & pulm recheck>  Larry Stone Stone returns and indicates a great response to prev Rx, feeling much better- SOB improved, & able to get about more now;  Last OV 07/22/16 c/o 63mo hx COPD exac (?FluB) including Hosp  &  we treated him w/ MEDROL 8mg Bid- slow taper, AlbutNEB Tid, Symbicort160-2spBid, Singulair10, Mucinex, fluids;  He took his last Medrol 1/2 tab recently & has continue the other med regularly;  He is concerned because he is going on a trip to [place] soon- cruise, train, bus, etc => we agreed to write for Levaquin500, Medrol Dosepak, & ProairHFA for prn needs...     EXAM shows Afeb, VSS, O2sat=98% on RA;  HEENT- s/p UPPP, ?VCD, mallamapti2;  Chest- decr BS at bases, clear now, no consolidation;  Heart- RR gr1/6 SEM w/o r/g;  Abd- soft, neg;  Ext- tr+edema w/o c/c;  Neuro- intact w/o focal deficits... IMP/PLAN>>  Larry Stone Stone is much improved s/p COPD exac likely viral flu mediated AB;  Off Ab/s & finished Medrol- he is asked to remain active, & take his NEB Tid (slowly wean), Symbicort Bid, Singulair, Mucinex, fluids;  As noted we gave him Levaquin, Medrol dosepak, Proair for his upcoming trip to [place]...            Problem List:     ALLERGIC to ASA w/ hives, & ACE inhibitors/ ARBs too w/  angioedema.  OBSTRUCTIVE SLEEP APNEA (ICD-327.23) - s/p UPPP surgery in 2004 by DrRedman...  states he's doing OK, denies snoring, no daytime hypersom, wife not complaining... uses ZYRTEK for allergy symtoms. ~  1/13: He has started using the "breathe right nasal strips" qhs for recurrent snoring; still says he rests well, denies daytime hypersomnolence, & declines offer for repeat sleep study to check... ~  7/14:  He reports resting well, no daytime symptoms or sleep issues...  ~  8/15:  He denies sleep disordered breathing... ~  6/16:  He remains asymptomatic- no snoring, daytime hypersomnolence, etc...  Hx of BRONCHITIS, RECURRENT (ICD-491.9) - he is an ex-smoker, prev 1 ppd for 32yrs, quit in 2004. COPD, mixed type- w/ mild centrilobular emphysema on CT Chest 10/11, & chronic bronchitis clinically w/ recurrent infections... ~  Hx tiny RML nodule seen on Cardiac CT 2/11 & apparently unchanged from old [place] 2004... ~  10/11: f/u CT Chest for f/u tiny 20mm nodule RML- unchanged xyrs & benign, mild centilob emphysema, +coronary calcif, NAD... ~  1/13: he denies breathing problems, intercurrent infections, etc... ~  1/14:  CXR 1/14 showed normal heart size, clear lungs, chr right rib deformity (old fx), NAD.  ~  8/15: he was concerned about several recent bouts of bronchitis requiring ZPak for Rx> we checked labs and Immunoglobulins =>   Decided to start inhaler w/ DULERA100-2spBid...  ~  CXR 8/15 showed normal heart size, clear lungs w/o infiltrates, old healed right rib fxs, NAD. ~  PFT 8/15 showed FVC=2.32 (50%), FEV1=1.63 (45%), %1sec=70, mid-flows=33% predicted; suggestive of GOLD Stage 3 COPD but can't r/o superimposed restriction w/o lung volume measurement. ~  LABS 8/15 showed SPE/IEP w/o monoclonal gammopathy; Quant Ig's showed normal IgG, normal IgA, low IgM at 26 (40-250), and a hi IgE at 1362 => we will follow w/ RAST panel & consider Xolair Rx. ~  11/15: he didn't fill the [facility] due  to cost- we decided to try SYMBICORT160-2spBid... ~  LABS 11/15 showed elev IgE level and mixed RAST panel allergens; discussed avoidance & he is a good cand for [employer] => insurance would NOT approve this med... ~  12/16: on Symbicort160-2spBid + using his mother's nebulizer +Albut; presented w/ refractory COPD exac=> treated w/ ZPak, Medrol taper, add Incruse & use NEB Tid, mucinex + Hycodan...  ~  1/17: on mother's NEB  machine Bid, Symbicort160-2spBid, Incruse daily, Medrol8 taper- down to 1/2Qod, Hycodan prn;  We decided to continue the Medrol4mg Qod & institute a vigorous antireflux regimen- Protonix40 taken 77min before dinner, NPO after dinner, elev HOB 6". ~  3/17:  He is improved and backed off on many of his meds... ~  4/17:  He presents w/ recurrent AB => treated w/ Levaquin, Prednisone20mg - 7d tapering sched, NEBs w/ DUONEB Tid followed by Symbicort160 & Incruse... ~  6/17>  He had Allergy eval from DrKozlow... ?approved for Xolair by his co-pays were prohibitive... ~  3/18>  He was Adm w/ apparent FluB pneumonitis & acute hypoxemic resp failure => responded to O2, broad spectrum Ab's + Tamiflu, Solumedrol, NEBS, Symbicort, etc... ~  4/18>  Persistent AB symptoms- given more Medrol (slower taper) + continue other Rx=> improved & back to baseline.   MEDICAL PROBLEMS PER DrPlotnikov >>   HYPERTENSION (ICD-401.9) - on COREG 12.5mg Bid, AMLODIPINE 5mg /d, & LASIX 40mg - only as needed (& he hasn't needed any); BP= 116/72 & he denies CP, palpit, ch in SOB, edema...  ARTERIOSCLEROTIC HEART DISEASE (ICD-414.00) - on PLAVIX 75mg /d, allergic to ASA... [facility] and seen 6/15 (note reviewed)... 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 ~  f/u Cardiac CT 2/11 showed>  Calcified plaque in the left main with < 50% stenosis.  Calcified plaque in the proximal and mid LAD with at most moderate (around 50%) stenosis in the mid LAD. ~  Vasc Screen per  Cards 10/11 showed mild carotid plaques, norm AbdAo & ABIs... ~  CATH 05/2016 by Vivi Barrack w/ 30-40% Lmain, nonobstructive dis in 1st diag 7 Circ, norm sys function 7 elev filling pressures=> med rx w/ Lasix40 added...  HYPERLIPIDEMIA (ICD-272.4) - on LIPITOR 40mg /d, & CoQ10... Last FLP 11/15 on Lip40 showed TChol 153, TG 42, HDL 53, LDL 92  DIABETES MELLITUS, BORDERLINE (ICD-790.29) - on diet alone... Labs 11/15- weight= 224#, BS=105, A1c=6.1  GERD (ICD-530.81) - he uses H2 blockers as needed.  DIVERTICULOSIS OF COLON (ICD-562.10) & COLONIC POLYPS (ICD-211.3) - his last polyps were ~51mm and removed in 2004= adenomatous... there is a +fam hx of colon cancer in his father who died at age 69... ~  Last colonoscopy 5/14 by DrKaplan (done w/ pt on Plavix rx)> mild divertics & 51mm polyp in desc colon removed (tubular adenoma) & repeat rec in 5 yrs.   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 ~  3/15: he is off the prev Testos replacement Rx> states he feels well, good energy, PSA=0.39  DEGENERATIVE JOINT DISEASE (ICD-715.90) - he reports doing fair- mostly c/o knees & hands ("I messed up my knee in a fall")... he saw Ortho ?who? on Glucosamine/ Chondroitin supplements.  LOW BACK PAIN SYNDROME (ICD-724.2)  MEMORY LOSS >>  ~  1/13: he states that his "memory is fuzzy" & "Clarity is missing"; occas he will swear that he said something but he really didn't; he's had prev scans etc & I have recommended a Neurology eval for his memory but he declines at this time "I'll think about it" he said...  ANXIETY (ICD-300.00) - on WELLBUTRIN 150mg Bid, & ALPRAZOLAM 0.5mg  Prn... increased stress w/ mother's stroke last year... he's the main care giver w/ 2 sisters who Larry Stone't help much...   Health Maintenance: ~  Immuniztions:  he is encouraged to get the yearly Flu vaccine... given PNEUMOVAX 2/12 & PREVNAR-13 given 11/15... He  reports a lot of travel & he's been to the  travel clinic & had "every vaccine known to man"> TDAP 7/14 and Zoster 5/14...   Past Surgical History:  Procedure Laterality Date  . CARDIAC CATHETERIZATION    . LEFT HEART CATH AND CORONARY ANGIOGRAPHY N/A 06/17/2016   Procedure: Left Heart Cath and Coronary Angiography;  Surgeon: Burnell Blanks, MD;  Location: [facility];  Service: Cardiovascular;  Laterality: N/A;  . UVULOPALATOPHARYNGOPLASTY     surgery for OSA    Outpatient Encounter Prescriptions as of 08/12/2016  Medication Sig  . albuterol (PROVENTIL) (2.5 MG/3ML) 0.083% nebulizer solution Use one vial in the nebulizer every 4-6 hours if needed for cough or wheeze  . ALPRAZolam (XANAX) 0.5 MG tablet Take 1 tablet (0.5 mg total) by mouth 3 (three) times daily as needed.  Marland Kitchen amLODipine (NORVASC) 5 MG tablet TAKE 1 TABLET BY MOUTH EVERY DAY  . ammonium lactate (LAC-HYDRIN) 12 % lotion Apply topically as directed.  Marland Kitchen atorvastatin (LIPITOR) 40 MG tablet Take 1 tablet (40 mg total) by mouth daily.  . budesonide-formoterol (SYMBICORT) 160-4.5 MCG/ACT inhaler INHALE 2 PUFFS INTO THE LUNGS 2 (TWO) TIMES DAILY.  Marland Kitchen buPROPion (WELLBUTRIN SR) 150 MG 12 hr tablet TAKE 1 TABLET BY MOUTH TWICE A DAY  . carvedilol (COREG) 12.5 MG tablet TAKE 1 TABLET BY MOUTH TWICE A DAY WITH A MEAL  . cetirizine (ZYRTEC) 10 MG tablet Take 1 tablet (10 mg total) by mouth daily.  . Cholecalciferol (VITAMIN D3) 5000 UNITS CAPS Take one tablet by mouth daily  . clopidogrel (PLAVIX) 75 MG tablet TAKE 1 TABLET (75 MG TOTAL) BY MOUTH DAILY.  . clotrimazole-betamethasone (LOTRISONE) cream APPLY AS DIRECTED (Patient taking differently: Apply 1 application topically 2 (two) times daily as needed. APPLY AS DIRECTED)  . Coenzyme Q10 (COQ10) 100 MG CAPS Take as directed (Patient taking differently: Take 1 capsule by mouth daily. Take as directed)  . cyanocobalamin 1000 MCG tablet Take 1,000 mcg by mouth daily.  . furosemide (LASIX) 40 MG tablet TAKE 1 TABLET BY  MOUTH EVERY DAY AS NEEDED  . montelukast (SINGULAIR) 10 MG tablet TAKE 1 TABLET BY MOUTH AT BEDTIME  . nitroGLYCERIN (NITROSTAT) 0.4 MG SL tablet PLACE 1 TABLET (0.4 MG TOTAL) UNDER THE TONGUE EVERY 5 (FIVE) MINUTES AS NEEDED. FOR UP TO 3 DOSES.  Marland Kitchen pantoprazole (PROTONIX) 40 MG tablet TAKE 1 TABLET BY MOUTH EVERY DAY 30 MINUTES BEFORE DINNERTIME  . VIAGRA 100 MG tablet TAKE 1 TABLET BY MOUTH AS NEEDED FOR ERECTILE DYSFUNCTION.  Marland Kitchen albuterol (PROAIR HFA) 108 (90 Base) MCG/ACT inhaler Inhale 1-2 puffs into the lungs every 6 (six) hours as needed for wheezing or shortness of breath.  . levofloxacin (LEVAQUIN) 500 MG tablet Take 1 tablet (500 mg total) by mouth daily.  . methylPREDNISolone (MEDROL) 4 MG tablet Take as directed  . [DISCONTINUED] buPROPion (WELLBUTRIN SR) 150 MG 12 hr tablet Take 1 tablet (150 mg total) by mouth 2 (two) times daily. (Patient not taking: Reported on 08/12/2016)  . [DISCONTINUED] guaiFENesin-dextromethorphan (ROBITUSSIN DM) 100-10 MG/5ML syrup Take 5 mLs by mouth every 4 (four) hours as needed for cough. (Patient not taking: Reported on 08/12/2016)  . [DISCONTINUED] methylPREDNISolone (MEDROL) 8 MG tablet Take as directed (Patient not taking: Reported on 08/12/2016)  . [DISCONTINUED] saccharomyces boulardii (FLORASTOR) 250 MG capsule Take 1 capsule (250 mg total) by mouth 2 (two) times daily. (Patient not taking: Reported on 08/12/2016)   No facility-administered encounter medications on file  as of 08/12/2016.     Allergies  Allergen Reactions  . Ace Inhibitors Swelling  . Angiotensin Receptor Blockers Swelling  . Aspirin Hives  . Codeine Nausea And Vomiting  . Irbesartan Other (See Comments)    REACTION: allergic to ARBs w/ angioedema  . Ramipril Other (See Comments)    REACTION: Allergic to ACE's w/ angioedema...    Immunization History  Administered Date(s) Administered  . Influenza Split 02/02/2011, 03/03/2012  . Influenza Whole 02/02/2010  . Influenza, High  Dose Seasonal PF 02/20/2013, 01/16/2015, 12/16/2015  . Influenza, Seasonal, Injecte, Preservative Fre 02/21/2014  . Pneumococcal Conjugate-13 03/05/2014  . Pneumococcal Polysaccharide-23 06/18/2010, 09/03/2015  . Tdap 11/14/2012  . Zoster 09/11/2012    Current Medications, Allergies, Past Medical History, Past Surgical History, Family History, and Social History were reviewed in [employer] record.    Review of Systems       See HPI - all other systems neg except as noted...       The patient complains of dyspnea on exertion and muscle weakness.  The patient denies anorexia, fever, weight loss, weight gain, vision loss, decreased hearing, hoarseness, chest pain, syncope, peripheral edema, prolonged cough, headaches, hemoptysis, abdominal pain, melena, hematochezia, severe indigestion/heartburn, hematuria, incontinence, suspicious skin lesions, transient blindness, difficulty walking, depression, unusual weight change, abnormal bleeding, enlarged lymph nodes, and angioedema.     Objective:   Physical Exam    WD, sl overweight, 66 y/o WM in NAD... GENERAL:  Alert & oriented; pleasant & cooperative... HEENT:  /AT, EOM-wnl, PERRLA, EACs-clear, TMs-wnl, NOSE-clear, THROAT-s/p UPPP surg... NECK:  Supple w/ fairROM; no JVD; normal carotid impulses w/o bruits; no thyromegaly or nodules palpated; no lymphadenopathy. CHEST:  Decr BS at bases otherw clear, no signs of consolidation... HEART:  Regular Rhythm; without murmurs/ rubs/ or gallops. ABDOMEN:  Soft & nontender; normal bowel sounds; no organomegaly or masses detected. EXT: without deformities, mild arthritic changes; no varicose veins/ venous insuffic/ or edema. Neuro:  intact w/o focal abn detected... DERM:  No lesions noted; no rash etc...  RADIOLOGY DATA:  Reviewed in the EPIC EMR & discussed w/ the patient...   LABORATORY DATA:  Reviewed in the EPIC EMR & discussed w/ the patient...     Assessment & Plan:     Hx OSA, s/p UPPP in 2004>  Prev noted recurrent snoring & using breathe right nasal strips; he didn't want repeat sleep study or further eval, just want Rx for the strips so he can pay for them w/ his flex acct...   Hx recurrent bronchitis, underlying COPD (mixed type), COPD exac>   11/15> He quit smoking in 2004 & c/o recurrent bronchitic exacerbations=> PFT shows mod obstructive dis & we tried DULERA but too $$$ therefore switch to SYMBICORT160-2spBid regularly w/ f/u PFTs later... 6/16> Xolair was denied by insurance despite his IgE level= 1400... 12/16> presented w/ refractory COPD exac; treated w/ ZPak, Medroltaper, Incruse, Mucinex, fluids, Hycodan => eventually improved...  3/17> he was improved and backed off on mult meds on his own accord... 4/17> recurrent AB symptoms prompted restart of Pred therapy, vigorous antireflux regimen, plus DUONEB Tid, Symbicort & Incruse 5/17> weaning Pred to 10-5 Qod schedule, continue other Rx, refer to DrKozlow for his input... 6/17> DrKozlow has made several adjustments & trying to get Xolair approved => pt couldn't afford Rx.  07/22/16>    Larry Stone Stone appears to have had a viral pneumonia & his serology was pos for FluB; no bacterial etiology established;  This  has flared his COPD/ reactive airways component requiring reinstitution of his NEBS Tid, Symbicort160-2spBid, Singulair, and MUCINEX 1200mg  bid w/ fluids;  We placed him back on a slow Medrol taper for the airway inflammation;  Asked to rest at home w/ fluids but NO SALT & we plan rov recheck in 2-3 weeks. 08/12/16>   Larry Stone Stone is much improved s/p COPD exac likely viral flu mediated AB;  Off Ab/s & finished Medrol- he is asked to remain active, & take his NEB Tid (slowly wean), Symbicort Bid, Singulair, Mucinex, fluids;  As noted we gave him Levaquin, Medrol dosepak, Proair for his upcoming trip to [place].  Medical issues per DrPlotnikov>  HBP, CAD s/pMI, VI/ edema, HL, IFG, GERD/ Divertics/ Polyps, GU/ Low-T,  DJD/ LBP, Anxiety...    Patient's Medications  New Prescriptions   ALBUTEROL (PROAIR HFA) 108 (90 BASE) MCG/ACT INHALER    Inhale 1-2 puffs into the lungs every 6 (six) hours as needed for wheezing or shortness of breath.   LEVOFLOXACIN (LEVAQUIN) 500 MG TABLET    Take 1 tablet (500 mg total) by mouth daily.   METHYLPREDNISOLONE (MEDROL) 4 MG TABLET    Take as directed  Previous Medications   ALBUTEROL (PROVENTIL) (2.5 MG/3ML) 0.083% NEBULIZER SOLUTION    Use one vial in the nebulizer every 4-6 hours if needed for cough or wheeze   ALPRAZOLAM (XANAX) 0.5 MG TABLET    Take 1 tablet (0.5 mg total) by mouth 3 (three) times daily as needed.   AMLODIPINE (NORVASC) 5 MG TABLET    TAKE 1 TABLET BY MOUTH EVERY DAY   AMMONIUM LACTATE (LAC-HYDRIN) 12 % LOTION    Apply topically as directed.   ATORVASTATIN (LIPITOR) 40 MG TABLET    Take 1 tablet (40 mg total) by mouth daily.   BUDESONIDE-FORMOTEROL (SYMBICORT) 160-4.5 MCG/ACT INHALER    INHALE 2 PUFFS INTO THE LUNGS 2 (TWO) TIMES DAILY.   BUPROPION (WELLBUTRIN SR) 150 MG 12 HR TABLET    TAKE 1 TABLET BY MOUTH TWICE A DAY   CARVEDILOL (COREG) 12.5 MG TABLET    TAKE 1 TABLET BY MOUTH TWICE A DAY WITH A MEAL   CETIRIZINE (ZYRTEC) 10 MG TABLET    Take 1 tablet (10 mg total) by mouth daily.   CHOLECALCIFEROL (VITAMIN D3) 5000 UNITS CAPS    Take one tablet by mouth daily   CLOPIDOGREL (PLAVIX) 75 MG TABLET    TAKE 1 TABLET (75 MG TOTAL) BY MOUTH DAILY.   CLOTRIMAZOLE-BETAMETHASONE (LOTRISONE) CREAM    APPLY AS DIRECTED   COENZYME Q10 (COQ10) 100 MG CAPS    Take as directed   CYANOCOBALAMIN 1000 MCG TABLET    Take 1,000 mcg by mouth daily.   FUROSEMIDE (LASIX) 40 MG TABLET    TAKE 1 TABLET BY MOUTH EVERY DAY AS NEEDED   MONTELUKAST (SINGULAIR) 10 MG TABLET    TAKE 1 TABLET BY MOUTH AT BEDTIME   NITROGLYCERIN (NITROSTAT) 0.4 MG SL TABLET    PLACE 1 TABLET (0.4 MG TOTAL) UNDER THE TONGUE EVERY 5 (FIVE) MINUTES AS NEEDED. FOR UP TO 3 DOSES.   PANTOPRAZOLE  (PROTONIX) 40 MG TABLET    TAKE 1 TABLET BY MOUTH EVERY DAY 30 MINUTES BEFORE DINNERTIME   VIAGRA 100 MG TABLET    TAKE 1 TABLET BY MOUTH AS NEEDED FOR ERECTILE DYSFUNCTION.  Modified Medications   No medications on file  Discontinued Medications   No medications on file

## 2016-08-12 NOTE — Patient Instructions (Signed)
Medication Instructions:  Your physician recommends that you continue on your current medications as directed. Please refer to the Current Medication list given to you today.   Labwork: None ordered  Testing/Procedures: None ordered  Follow-Up: Your physician recommends that you schedule a follow-up appointment in: 4 MONTHS WITH DR. MCALHANY   Any Other Special Instructions Will Be Listed Below (If Applicable).    If you need a refill on your cardiac medications before your next appointment, please call your pharmacy.   

## 2016-08-12 NOTE — Patient Instructions (Signed)
Today we updated your med list in our EPIC system...    Continue your current medications the same...  Glad you are so much better now & recently weaned off the Medrol...  Continue the NEBS w/ Albuterol up to 3 times daily as needed...  We wrote for a rescue inhaler to carry w/ you while you are out & about...  Continue the SYMBICORT160- 2sprays twice daily every day, and the Singulair daily...  Call for any questions...  We wrote for an antibiotic & a Medrol dosepak to take on your [place] vacation...  Let's plan a follow up visit in 6mo, sooner if needed for problems.Marland KitchenMarland Kitchen

## 2016-08-20 ENCOUNTER — Other Ambulatory Visit (INDEPENDENT_AMBULATORY_CARE_PROVIDER_SITE_OTHER): Payer: Medicare Other

## 2016-08-20 ENCOUNTER — Encounter: Payer: Self-pay | Admitting: Internal Medicine

## 2016-08-20 ENCOUNTER — Ambulatory Visit (INDEPENDENT_AMBULATORY_CARE_PROVIDER_SITE_OTHER)
Admission: RE | Admit: 2016-08-20 | Discharge: 2016-08-20 | Disposition: A | Discharge status: Home / Self Care | Payer: Medicare Other | Source: Ambulatory Visit | Attending: Internal Medicine | Admitting: Internal Medicine

## 2016-08-20 ENCOUNTER — Ambulatory Visit (INDEPENDENT_AMBULATORY_CARE_PROVIDER_SITE_OTHER): Payer: Medicare Other | Admitting: Internal Medicine

## 2016-08-20 DIAGNOSIS — J129 Viral pneumonia, unspecified: Secondary | ICD-10-CM | POA: Diagnosis not present

## 2016-08-20 DIAGNOSIS — Z0000 Encounter for general adult medical examination without abnormal findings: Secondary | ICD-10-CM

## 2016-08-20 DIAGNOSIS — I5032 Chronic diastolic (congestive) heart failure: Secondary | ICD-10-CM

## 2016-08-20 DIAGNOSIS — I872 Venous insufficiency (chronic) (peripheral): Secondary | ICD-10-CM

## 2016-08-20 DIAGNOSIS — I2510 Atherosclerotic heart disease of native coronary artery without angina pectoris: Secondary | ICD-10-CM | POA: Diagnosis not present

## 2016-08-20 DIAGNOSIS — R600 Localized edema: Secondary | ICD-10-CM

## 2016-08-20 LAB — BASIC METABOLIC PANEL
BUN: 15 mg/dL (ref 6–23)
CALCIUM: 9.1 mg/dL (ref 8.4–10.5)
CO2: 30 meq/L (ref 19–32)
CREATININE: 1.07 mg/dL (ref 0.40–1.50)
Chloride: 106 mEq/L (ref 96–112)
GFR: 73.44 mL/min (ref >60.00)
Glucose, Bld: 101 mg/dL — ABNORMAL HIGH (ref 70–99)
Potassium: 4.6 mEq/L (ref 3.5–5.1)
Sodium: 140 mEq/L (ref 135–145)

## 2016-08-20 LAB — CBC WITH DIFFERENTIAL/PLATELET
BASOS ABS: 0 10*3/uL (ref 0.0–0.1)
Basophils Relative: 0.4 % (ref 0.0–3.0)
Eosinophils Absolute: 0.2 10*3/uL (ref 0.0–0.7)
Eosinophils Relative: 2.8 % (ref 0.0–5.0)
HEMATOCRIT: 39.5 % (ref 39.0–52.0)
Hemoglobin: 13.6 g/dL (ref 13.0–17.0)
LYMPHS ABS: 1.5 10*3/uL (ref 0.7–4.0)
LYMPHS PCT: 19.3 % (ref 12.0–46.0)
MCHC: 34.5 g/dL (ref 30.0–36.0)
MCV: 93.9 fl (ref 78.0–100.0)
Monocytes Absolute: 1 10*3/uL (ref 0.1–1.0)
Monocytes Relative: 12.7 % — ABNORMAL HIGH (ref 3.0–12.0)
NEUTROS ABS: 5.2 10*3/uL (ref 1.4–7.7)
NEUTROS PCT: 64.8 % (ref 43.0–77.0)
PLATELETS: 145 10*3/uL — AB (ref 150.0–400.0)
RBC: 4.2 Mil/uL — ABNORMAL LOW (ref 4.22–5.81)
RDW: 13.9 % (ref 11.5–15.5)
WBC: 7.9 10*3/uL (ref 4.0–10.5)

## 2016-08-20 MED ORDER — ZOSTER VAC RECOMB ADJUVANTED 50 MCG/0.5ML IM SUSR: 0.5000 mL | Freq: Once | INTRAMUSCULAR | 1 refills | Status: AC

## 2016-08-20 NOTE — Progress Notes (Signed)
Subjective:  Patient ID: Larry Stone, male    DOB: 25-Aug-1950  Age: 66 y.o. MRN: [id number]  CC: No chief complaint on file.   HPI Larry Stone presents for sepsis and CAP f/u C/o fatigue and exertional SOB.  Per d/c Dr Doyle Askew:  Acute hypoxic respiratory failure - Likely from COPD exacerbation and multilobar PNA, influenza B +, possible bacterial component  - pt reports feeling better this AM, able to take off oxygen  - wants to go home - will change solumedrol to Prednisone, change Rocephin to Doxycycline   Sepsis  - secondary to multilobar PNA, influenza B, possible bacterial component as well  - ABX as noted above - also must complete Tamiflu as noted above  - WBC is slightly up this am but that is likely steroid induced   HTN, essential - continue home medical regimen  - reasonable inpatient control   CAD - h/o MI. Trop neg. EKG w/o ACS. No CP - continue Lipitor, plavix  Depression/Anxiety - continue wellbutrin, Xanax per home medical regimen   Thrombocytopenia  - monitor for now, overall stsable   Chronic diastolic congestive heart failure - Last echo showing EF of 55% and grade 1 diastolic dysfunction. No evidence of the conversation at this time. BNP 213. - resume home regimen with Lasix    Outpatient Medications Prior to Visit  Medication Sig Dispense Refill  . albuterol (PROAIR HFA) 108 (90 Base) MCG/ACT inhaler Inhale 1-2 puffs into the lungs every 6 (six) hours as needed for wheezing or shortness of breath. 1 Inhaler 11  . albuterol (PROVENTIL) (2.5 MG/3ML) 0.083% nebulizer solution Use one vial in the nebulizer every 4-6 hours if needed for cough or wheeze 225 mL 1  . ALPRAZolam (XANAX) 0.5 MG tablet Take 1 tablet (0.5 mg total) by mouth 3 (three) times daily as needed. 90 tablet 3  . amLODipine (NORVASC) 5 MG tablet TAKE 1 TABLET BY MOUTH EVERY DAY 90 tablet 1  . ammonium lactate (LAC-HYDRIN) 12 % lotion Apply topically as directed. 400 g 3   . atorvastatin (LIPITOR) 40 MG tablet TAKE 1 TABLET BY MOUTH EVERY DAY 90 tablet 3  . budesonide-formoterol (SYMBICORT) 160-4.5 MCG/ACT inhaler INHALE 2 PUFFS INTO THE LUNGS 2 (TWO) TIMES DAILY. 3 Inhaler 3  . buPROPion (WELLBUTRIN SR) 150 MG 12 hr tablet TAKE 1 TABLET BY MOUTH TWICE A DAY 180 tablet 0  . carvedilol (COREG) 12.5 MG tablet TAKE 1 TABLET BY MOUTH TWICE A DAY WITH A MEAL 180 tablet 0  . cetirizine (ZYRTEC) 10 MG tablet Take 1 tablet (10 mg total) by mouth daily. 30 tablet 11  . Cholecalciferol (VITAMIN D3) 5000 UNITS CAPS Take one tablet by mouth daily    . clopidogrel (PLAVIX) 75 MG tablet TAKE 1 TABLET (75 MG TOTAL) BY MOUTH DAILY. 90 tablet 3  . clotrimazole-betamethasone (LOTRISONE) cream APPLY AS DIRECTED (Patient taking differently: Apply 1 application topically 2 (two) times daily as needed. APPLY AS DIRECTED) 45 g 11  . Coenzyme Q10 (COQ10) 100 MG CAPS Take as directed (Patient taking differently: Take 1 capsule by mouth daily. Take as directed) 30 each 11  . cyanocobalamin 1000 MCG tablet Take 1,000 mcg by mouth daily.    . furosemide (LASIX) 40 MG tablet TAKE 1 TABLET BY MOUTH EVERY DAY AS NEEDED 90 tablet 3  . levofloxacin (LEVAQUIN) 500 MG tablet Take 1 tablet (500 mg total) by mouth daily. 7 tablet 0  . methylPREDNISolone (MEDROL) 4 MG tablet  Take as directed 21 tablet 0  . montelukast (SINGULAIR) 10 MG tablet TAKE 1 TABLET BY MOUTH AT BEDTIME 90 tablet 0  . nitroGLYCERIN (NITROSTAT) 0.4 MG SL tablet PLACE 1 TABLET (0.4 MG TOTAL) UNDER THE TONGUE EVERY 5 (FIVE) MINUTES AS NEEDED. FOR UP TO 3 DOSES. 25 tablet 3  . pantoprazole (PROTONIX) 40 MG tablet TAKE 1 TABLET BY MOUTH EVERY DAY 30 MINUTES BEFORE DINNERTIME 90 tablet 0  . VIAGRA 100 MG tablet TAKE 1 TABLET BY MOUTH AS NEEDED FOR ERECTILE DYSFUNCTION. 15 tablet 0   No facility-administered medications prior to visit.     ROS Review of Systems  Constitutional: Positive for fatigue. Negative for appetite change and  unexpected weight change.  HENT: Negative for congestion, nosebleeds, sneezing, sore throat and trouble swallowing.   Eyes: Negative for itching and visual disturbance.  Respiratory: Positive for cough.   Cardiovascular: Positive for leg swelling. Negative for chest pain and palpitations.  Gastrointestinal: Negative for abdominal distention, blood in stool, diarrhea and nausea.  Genitourinary: Negative for frequency and hematuria.  Musculoskeletal: Negative for back pain, gait problem, joint swelling and neck pain.  Skin: Negative for rash.  Neurological: Negative for dizziness, tremors, speech difficulty and weakness.  Psychiatric/Behavioral: Negative for agitation, dysphoric mood and sleep disturbance. The patient is not nervous/anxious.   trace edema B  Objective:  BP 122/64 (BP Location: Left Arm, Patient Position: Sitting, Cuff Size: Large)   Pulse 69   Temp 97.8 F (36.6 C) (Oral)   Ht 5\' 10"  (1.778 m)   Wt 216 lb 0.6 oz (98 kg)   SpO2 99%   BMI 31.00 kg/m   BP Readings from Last 3 Encounters:  08/20/16 122/64  08/12/16 122/70  08/12/16 118/72    Wt Readings from Last 3 Encounters:  08/20/16 216 lb 0.6 oz (98 kg)  08/12/16 213 lb 2 oz (96.7 kg)  08/12/16 214 lb (97.1 kg)    Physical Exam  Constitutional: He is oriented to person, place, and time. He appears well-developed. No distress.  NAD  HENT:  Mouth/Throat: Oropharynx is clear and moist.  Eyes: Conjunctivae are normal. Pupils are equal, round, and reactive to light.  Neck: Normal range of motion. No JVD present. No thyromegaly present.  Cardiovascular: Normal rate, regular rhythm, normal heart sounds and intact distal pulses.  Exam reveals no gallop and no friction rub.   No murmur heard. Pulmonary/Chest: Effort normal and breath sounds normal. No respiratory distress. He has no wheezes. He has no rales. He exhibits no tenderness.  Abdominal: Soft. Bowel sounds are normal. He exhibits no distension and no  mass. There is no tenderness. There is no rebound and no guarding.  Musculoskeletal: Normal range of motion. He exhibits edema and tenderness.  Lymphadenopathy:    He has no cervical adenopathy.  Neurological: He is alert and oriented to person, place, and time. He has normal reflexes. No cranial nerve deficit. He exhibits normal muscle tone. He displays a negative Romberg sign. Coordination and gait normal.  Skin: Skin is warm and dry. No rash noted.  Psychiatric: He has a normal mood and affect. His behavior is normal. Judgment and thought content normal.  petechia on LEs Edema 1+ B  Lab Results  Component Value Date   WBC 13.6 (H) 07/22/2016   HGB 15.3 07/22/2016   HCT 45.7 07/22/2016   PLT 192.0 07/22/2016   GLUCOSE 89 07/22/2016   CHOL 128 03/30/2016   TRIG 59.0 03/30/2016   HDL 44.30 03/30/2016  LDLCALC 72 03/30/2016   ALT 38 07/22/2016   AST 18 07/22/2016   NA 138 07/22/2016   K 4.3 07/22/2016   CL 100 07/22/2016   CREATININE 1.18 07/22/2016   BUN 25 (H) 07/22/2016   CO2 32 07/22/2016   TSH 1.78 03/30/2016   PSA 0.43 03/30/2016   INR 1.0 06/09/2016   HGBA1C 5.7 10/15/2014    Dg Chest 2 View  Result Date: 07/22/2016 CLINICAL DATA:  Shortness of breath, history of recent pneumonia, followup EXAM: CHEST  2 VIEW COMPARISON:  Chest x-ray of 07/13/2016 and CT chest of 07/14/2016 FINDINGS: There is residual parenchymal opacity within the medial left lower lobe and in the lingula-right middle lobe but compare to the prior CT consistent with residual pneumonia. No pleural effusion is seen. No progression of pneumonia is seen. Mediastinal and hilar contours are unremarkable. The heart is within normal limits in size. No bony abnormality is seen. IMPRESSION: There is still opacity within the left lower lobe, lingula and/or right middle lobe consistent with residual pneumonia. No new or enlarging of opacity is seen. Electronically Signed   By: Ivar Drape M.D.   On: 07/22/2016 14:01      Assessment & Plan:   There are no diagnoses linked to this encounter. I am having Mr. Simington maintain his cetirizine, CoQ10, Vitamin D3, ammonium lactate, clotrimazole-betamethasone, budesonide-formoterol, cyanocobalamin, ALPRAZolam, amLODipine, carvedilol, clopidogrel, albuterol, montelukast, nitroGLYCERIN, furosemide, VIAGRA, buPROPion, pantoprazole, levofloxacin, methylPREDNISolone, albuterol, and atorvastatin.  No orders of the defined types were placed in this encounter.    Follow-up: No Follow-up on file.  [name], MD

## 2016-08-20 NOTE — Assessment & Plan Note (Signed)
Compr socks 

## 2016-08-20 NOTE — Assessment & Plan Note (Signed)
CXR Levaquin for travel

## 2016-08-20 NOTE — Assessment & Plan Note (Signed)
NAS diet 

## 2016-08-20 NOTE — Progress Notes (Signed)
Pre visit review using our clinic review tool, if applicable. No additional management support is needed unless otherwise documented below in the visit note. 

## 2016-08-20 NOTE — Assessment & Plan Note (Signed)
No CP 

## 2016-08-20 NOTE — Assessment & Plan Note (Signed)
Socks May need to reduce Norvasc dose

## 2016-08-21 LAB — HEPATITIS C ANTIBODY: HCV AB: NEGATIVE

## 2016-08-25 ENCOUNTER — Other Ambulatory Visit: Payer: Self-pay | Admitting: Cardiovascular Disease

## 2016-08-25 ENCOUNTER — Other Ambulatory Visit: Payer: Self-pay | Admitting: Allergy and Immunology

## 2016-09-26 ENCOUNTER — Other Ambulatory Visit: Payer: Self-pay | Admitting: Allergy and Immunology

## 2016-09-26 ENCOUNTER — Other Ambulatory Visit: Payer: Self-pay | Admitting: Cardiovascular Disease

## 2016-09-26 ENCOUNTER — Encounter: Payer: Self-pay | Admitting: Pulmonary Disease

## 2016-09-27 ENCOUNTER — Other Ambulatory Visit: Payer: Self-pay | Admitting: Pulmonary Disease

## 2016-09-27 MED ORDER — ALBUTEROL SULFATE (2.5 MG/3ML) 0.083% IN NEBU: INHALATION_SOLUTION | RESPIRATORY_TRACT | 11 refills | Status: DC

## 2016-09-27 MED ORDER — MONTELUKAST SODIUM 10 MG PO TABS: 10.0000 mg | ORAL_TABLET | Freq: Every day | ORAL | 4 refills | Status: DC

## 2016-09-27 NOTE — Telephone Encounter (Signed)
Refills have been sent in for the pt.   

## 2016-09-27 NOTE — Telephone Encounter (Signed)
Dr Lenna Gilford, please advise if you are okay with taking over prescribing the patient's Montelukast and Albuterol nebulizer solution. Pt is no longer wanting to follow up with Dr Neldon Mc.  See patient e-mail. Thanks.   Good morning,  Back in late 2017 or early 2018, due to repeated bronchial issues, you recommended that I see Dr. Neldon Mc for allergy evaluation. To make a long story short, I did see him and found the consultations educational but the benefit to my specific issues very limited. One significant advancement was adding the prescribed medications MONTELUKAST SOD 10 MG TABLET and ALBUTEROL SUL 2.5 MG/3 ML SOLN. I believe both of these to be important in the ongoing treatment of the related pulmonary condition for which I have seen you for years.  However, I see no reason for follow-up visits with Dr. Neldon Mc other than obtaining paperwork for these two medications and I would believe his opinion to be the same. It is now time for me to renew these prescriptions and I would ask that you add these to the list of what you now manage. In other words, you write them for me.  I sincerely appreciate your considering this request.  Thanks,  Larry Stone Oct 14, 1950 [phone]

## 2016-10-11 ENCOUNTER — Other Ambulatory Visit: Payer: Self-pay | Admitting: Pulmonary Disease

## 2016-12-12 ENCOUNTER — Other Ambulatory Visit: Payer: Self-pay | Admitting: Internal Medicine

## 2016-12-16 ENCOUNTER — Other Ambulatory Visit: Payer: Self-pay | Admitting: Internal Medicine

## 2016-12-17 ENCOUNTER — Other Ambulatory Visit: Payer: Self-pay | Admitting: Internal Medicine

## 2016-12-22 ENCOUNTER — Encounter: Payer: Self-pay | Admitting: Internal Medicine

## 2016-12-22 ENCOUNTER — Ambulatory Visit: Payer: Medicare Other | Admitting: Pulmonary Disease

## 2016-12-22 ENCOUNTER — Ambulatory Visit (INDEPENDENT_AMBULATORY_CARE_PROVIDER_SITE_OTHER): Payer: Medicare Other | Admitting: Internal Medicine

## 2016-12-22 ENCOUNTER — Ambulatory Visit (INDEPENDENT_AMBULATORY_CARE_PROVIDER_SITE_OTHER): Payer: Medicare Other | Admitting: Cardiovascular Disease

## 2016-12-22 ENCOUNTER — Encounter: Payer: Self-pay | Admitting: Cardiovascular Disease

## 2016-12-22 VITALS — BP 110/70 | HR 63 | Ht 70.0 in | Wt 222.0 lb

## 2016-12-22 VITALS — BP 114/72 | HR 56 | Temp 98.0°F | Ht 70.0 in | Wt 220.0 lb

## 2016-12-22 DIAGNOSIS — I5032 Chronic diastolic (congestive) heart failure: Secondary | ICD-10-CM

## 2016-12-22 DIAGNOSIS — E7800 Pure hypercholesterolemia, unspecified: Secondary | ICD-10-CM

## 2016-12-22 DIAGNOSIS — Z23 Encounter for immunization: Secondary | ICD-10-CM

## 2016-12-22 DIAGNOSIS — I10 Essential (primary) hypertension: Secondary | ICD-10-CM | POA: Diagnosis not present

## 2016-12-22 DIAGNOSIS — F411 Generalized anxiety disorder: Secondary | ICD-10-CM | POA: Diagnosis not present

## 2016-12-22 DIAGNOSIS — Z0000 Encounter for general adult medical examination without abnormal findings: Secondary | ICD-10-CM

## 2016-12-22 DIAGNOSIS — I2510 Atherosclerotic heart disease of native coronary artery without angina pectoris: Secondary | ICD-10-CM | POA: Diagnosis not present

## 2016-12-22 MED ORDER — ZOSTER VAC RECOMB ADJUVANTED 50 MCG/0.5ML IM SUSR: 0.5000 mL | Freq: Once | INTRAMUSCULAR | 1 refills | Status: DC

## 2016-12-22 MED ORDER — ZOSTER VAC RECOMB ADJUVANTED 50 MCG/0.5ML IM SUSR: 0.5000 mL | Freq: Once | INTRAMUSCULAR | 1 refills | Status: AC

## 2016-12-22 MED FILL — SHINGRIX 50 MCG SUS: 50 | 1 days supply | Qty: 1 | Fill #0

## 2016-12-22 NOTE — Patient Instructions (Addendum)

## 2016-12-22 NOTE — Assessment & Plan Note (Signed)
NAS diet 

## 2016-12-22 NOTE — Progress Notes (Signed)
Subjective:  Patient ID: Larry Stone, male    DOB: 03/06/1951  Age: 66 y.o. MRN: [id number]  CC: No chief complaint on file.   HPI Larry Stone presents for CAD, HTN, anxiety f/u  Outpatient Medications Prior to Visit  Medication Sig Dispense Refill  . albuterol (PROAIR HFA) 108 (90 Base) MCG/ACT inhaler Inhale 1-2 puffs into the lungs every 6 (six) hours as needed for wheezing or shortness of breath. 1 Inhaler 11  . albuterol (PROVENTIL) (2.5 MG/3ML) 0.083% nebulizer solution Use one vial in the nebulizer every 4-6 hours if needed for cough or wheeze 225 mL 11  . ALPRAZolam (XANAX) 0.5 MG tablet TAKE 1 TABLET BY MOUTH THREE TIMES A DAY AS NEEDED 90 tablet 2  . amLODipine (NORVASC) 5 MG tablet TAKE 1 TABLET BY MOUTH EVERY DAY 90 tablet 2  . ammonium lactate (LAC-HYDRIN) 12 % lotion Apply topically as directed. 400 g 3  . atorvastatin (LIPITOR) 40 MG tablet TAKE 1 TABLET BY MOUTH EVERY DAY 90 tablet 3  . budesonide-formoterol (SYMBICORT) 160-4.5 MCG/ACT inhaler INHALE 2 PUFFS INTO THE LUNGS 2 (TWO) TIMES DAILY. 3 Inhaler 3  . buPROPion (WELLBUTRIN SR) 150 MG 12 hr tablet TAKE 1 TABLET BY MOUTH TWICE A DAY 180 tablet 0  . carvedilol (COREG) 12.5 MG tablet TAKE 1 TABLET BY MOUTH TWICE A DAY WITH A MEAL 180 tablet 3  . cetirizine (ZYRTEC) 10 MG tablet Take 1 tablet (10 mg total) by mouth daily. 30 tablet 11  . Cholecalciferol (VITAMIN D3) 5000 UNITS CAPS Take one tablet by mouth daily    . clopidogrel (PLAVIX) 75 MG tablet TAKE 1 TABLET (75 MG TOTAL) BY MOUTH DAILY. 90 tablet 3  . clotrimazole-betamethasone (LOTRISONE) cream APPLY AS DIRECTED (Patient taking differently: Apply 1 application topically 2 (two) times daily as needed. APPLY AS DIRECTED) 45 g 11  . Coenzyme Q10 (COQ10) 100 MG CAPS Take as directed (Patient taking differently: Take 1 capsule by mouth daily. Take as directed) 30 each 11  . cyanocobalamin 1000 MCG tablet Take 1,000 mcg by mouth daily.    . furosemide (LASIX) 40  MG tablet TAKE 1 TABLET BY MOUTH EVERY DAY AS NEEDED 90 tablet 3  . methylPREDNISolone (MEDROL) 4 MG tablet Take as directed 21 tablet 0  . montelukast (SINGULAIR) 10 MG tablet Take 1 tablet (10 mg total) by mouth at bedtime. 90 tablet 4  . nitroGLYCERIN (NITROSTAT) 0.4 MG SL tablet PLACE 1 TABLET (0.4 MG TOTAL) UNDER THE TONGUE EVERY 5 (FIVE) MINUTES AS NEEDED. FOR UP TO 3 DOSES. 25 tablet 3  . pantoprazole (PROTONIX) 40 MG tablet TAKE 1 TABLET BY MOUTH EVERY DAY 30 MINUTES BEFORE DINNERTIME 90 tablet 0  . VIAGRA 100 MG tablet TAKE 1 TABLET BY MOUTH AS NEEDED FOR ERECTILE DYSFUNCTION. 15 tablet 0   No facility-administered medications prior to visit.     ROS Review of Systems  Constitutional: Negative for appetite change, fatigue and unexpected weight change.  HENT: Negative for congestion, nosebleeds, sneezing, sore throat and trouble swallowing.   Eyes: Negative for itching and visual disturbance.  Respiratory: Negative for cough.   Cardiovascular: Negative for chest pain, palpitations and leg swelling.  Gastrointestinal: Negative for abdominal distention, blood in stool, diarrhea and nausea.  Genitourinary: Negative for frequency and hematuria.  Musculoskeletal: Negative for back pain, gait problem, joint swelling and neck pain.  Skin: Negative for rash.  Neurological: Negative for dizziness, tremors, speech difficulty and weakness.  Psychiatric/Behavioral: Negative for  agitation, dysphoric mood and sleep disturbance. The patient is not nervous/anxious.     Objective:  BP 114/72 (BP Location: Left Arm, Patient Position: Sitting, Cuff Size: Large)   Pulse (!) 56   Temp 98 F (36.7 C) (Oral)   Ht 5\' 10"  (1.778 m)   Wt 220 lb (99.8 kg)   SpO2 99%   BMI 31.57 kg/m   BP Readings from Last 3 Encounters:  12/22/16 114/72  12/22/16 110/70  08/20/16 122/64    Wt Readings from Last 3 Encounters:  12/22/16 220 lb (99.8 kg)  12/22/16 222 lb (100.7 kg)  08/20/16 216 lb 0.6 oz (98  kg)    Physical Exam  Constitutional: He is oriented to person, place, and time. He appears well-developed. No distress.  NAD  HENT:  Mouth/Throat: Oropharynx is clear and moist.  Eyes: Pupils are equal, round, and reactive to light. Conjunctivae are normal.  Neck: Normal range of motion. No JVD present. No thyromegaly present.  Cardiovascular: Normal rate, regular rhythm, normal heart sounds and intact distal pulses.  Exam reveals no gallop and no friction rub.   No murmur heard. Pulmonary/Chest: Effort normal and breath sounds normal. No respiratory distress. He has no wheezes. He has no rales. He exhibits no tenderness.  Abdominal: Soft. Bowel sounds are normal. He exhibits no distension and no mass. There is no tenderness. There is no rebound and no guarding.  Musculoskeletal: Normal range of motion. He exhibits no edema or tenderness.  Lymphadenopathy:    He has no cervical adenopathy.  Neurological: He is alert and oriented to person, place, and time. He has normal reflexes. No cranial nerve deficit. He exhibits normal muscle tone. He displays a negative Romberg sign. Coordination and gait normal.  Skin: Skin is warm and dry. No rash noted.  Psychiatric: He has a normal mood and affect. His behavior is normal. Judgment and thought content normal.    Lab Results  Component Value Date   WBC 7.9 08/20/2016   HGB 13.6 08/20/2016   HCT 39.5 08/20/2016   PLT 145.0 (L) 08/20/2016   GLUCOSE 101 (H) 08/20/2016   CHOL 128 03/30/2016   TRIG 59.0 03/30/2016   HDL 44.30 03/30/2016   LDLCALC 72 03/30/2016   ALT 38 07/22/2016   AST 18 07/22/2016   NA 140 08/20/2016   K 4.6 08/20/2016   CL 106 08/20/2016   CREATININE 1.07 08/20/2016   BUN 15 08/20/2016   CO2 30 08/20/2016   TSH 1.78 03/30/2016   PSA 0.43 03/30/2016   INR 1.0 06/09/2016   HGBA1C 5.7 10/15/2014    Dg Chest 2 View  Result Date: 08/20/2016 CLINICAL DATA:  Recent pneumonia EXAM: CHEST  2 VIEW COMPARISON:  July 22, 2016 FINDINGS: There is currently no edema or consolidation. Heart size and pulmonary vascularity are normal. No adenopathy. There is mild degenerative change in the thoracic spine. There old healed rib fractures on the right. IMPRESSION: Currently no edema or consolidation.  Stable cardiac silhouette. Electronically Signed   By: Lowella Grip III M.D.   On: 08/20/2016 09:56    Assessment & Plan:   There are no diagnoses linked to this encounter. I am having Mr. Vale maintain his cetirizine, CoQ10, Vitamin D3, ammonium lactate, clotrimazole-betamethasone, budesonide-formoterol, cyanocobalamin, clopidogrel, nitroGLYCERIN, furosemide, VIAGRA, methylPREDNISolone, albuterol, atorvastatin, carvedilol, amLODipine, albuterol, montelukast, buPROPion, pantoprazole, and ALPRAZolam.  No orders of the defined types were placed in this encounter.    Follow-up: No Follow-up on file.  [name], MD

## 2016-12-22 NOTE — Assessment & Plan Note (Signed)
Cont w/Coreg, amlodipine, Lasix, Plavix NTG prn

## 2016-12-22 NOTE — Addendum Note (Signed)
Addended by: Karren Cobble on: 12/22/2016 11:48 AM   Modules accepted: Orders

## 2016-12-22 NOTE — Progress Notes (Signed)
Chief Complaint  Patient presents with  . Follow-up    CAD     History of Present Illness: 66 yo male with history of CAD, HLD, HTN, OSA and chronic diastolic CHF here today for cardiac follow up. 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 Stress myoview in June 2015 with inferolateral scar and no ischemia. When I saw him in February 2018 he was c/o dyspnea and fatigue. Cardiac cath on 06/17/16 with 40% left main stenosis, mild disease in the diagonal and proximal circumflex.   He is here today for follow up. The patient denies any chest pain, dyspnea, palpitations, orthopnea, PND, dizziness, near syncope or syncope. He does have lower extremity edema. He is taking Lasix 40 mg every day.    Primary Care Physician: Cassandria Anger, MD   Past Medical History:  Diagnosis Date  . Anxiety   . CAD    a. acute inferior lateral wall infarction in September 2004 treated medically. b.  cath 06/17/16 showing mild nonobstructive CAD with 30-40% ostial LM (eccentric), elevated LV filling pressures and normal LV function.  . Chronic diastolic CHF (congestive heart failure) ([place])   . COPD (chronic obstructive pulmonary disease) ([place])   . Diabetes mellitus   . Diverticulosis   . DJD (degenerative joint disease)   . GERD (gastroesophageal reflux disease)   . HTN (hypertension)   . Hyperlipidemia   . Low back pain syndrome   . Myocardial infarction ([place]) 2004  . Obesity   . OSA (obstructive sleep apnea)   . Recurrent aspiration bronchitis/pneumonia ([place])   . Recurrent aspiration pneumonia ([place])    [name] 07/13/2016  . Thrombocytopenia ([place])   . Tubular adenoma of colon 2014    Past Surgical History:  Procedure Laterality Date  . CARDIAC CATHETERIZATION    . LEFT HEART CATH AND CORONARY ANGIOGRAPHY N/A 06/17/2016   Procedure: Left Heart Cath and Coronary Angiography;  Surgeon: Burnell Blanks, MD;   Location: [facility];  Service: Cardiovascular;  Laterality: N/A;  . UVULOPALATOPHARYNGOPLASTY     surgery for OSA    Current Outpatient Prescriptions  Medication Sig Dispense Refill  . albuterol (PROAIR HFA) 108 (90 Base) MCG/ACT inhaler Inhale 1-2 puffs into the lungs every 6 (six) hours as needed for wheezing or shortness of breath. 1 Inhaler 11  . albuterol (PROVENTIL) (2.5 MG/3ML) 0.083% nebulizer solution Use one vial in the nebulizer every 4-6 hours if needed for cough or wheeze 225 mL 11  . ALPRAZolam (XANAX) 0.5 MG tablet TAKE 1 TABLET BY MOUTH THREE TIMES A DAY AS NEEDED 90 tablet 2  . amLODipine (NORVASC) 5 MG tablet TAKE 1 TABLET BY MOUTH EVERY DAY 90 tablet 2  . ammonium lactate (LAC-HYDRIN) 12 % lotion Apply topically as directed. 400 g 3  . atorvastatin (LIPITOR) 40 MG tablet TAKE 1 TABLET BY MOUTH EVERY DAY 90 tablet 3  . budesonide-formoterol (SYMBICORT) 160-4.5 MCG/ACT inhaler INHALE 2 PUFFS INTO THE LUNGS 2 (TWO) TIMES DAILY. 3 Inhaler 3  . buPROPion (WELLBUTRIN SR) 150 MG 12 hr tablet TAKE 1 TABLET BY MOUTH TWICE A DAY 180 tablet 0  . carvedilol (COREG) 12.5 MG tablet TAKE 1 TABLET BY MOUTH TWICE A DAY WITH A MEAL 180 tablet 3  . cetirizine (ZYRTEC) 10 MG tablet Take 1 tablet (10 mg total) by mouth daily. 30 tablet 11  . Cholecalciferol (VITAMIN D3) 5000 UNITS CAPS  Take one tablet by mouth daily    . clopidogrel (PLAVIX) 75 MG tablet TAKE 1 TABLET (75 MG TOTAL) BY MOUTH DAILY. 90 tablet 3  . clotrimazole-betamethasone (LOTRISONE) cream APPLY AS DIRECTED (Patient taking differently: Apply 1 application topically 2 (two) times daily as needed. APPLY AS DIRECTED) 45 g 11  . Coenzyme Q10 (COQ10) 100 MG CAPS Take as directed (Patient taking differently: Take 1 capsule by mouth daily. Take as directed) 30 each 11  . cyanocobalamin 1000 MCG tablet Take 1,000 mcg by mouth daily.    . furosemide (LASIX) 40 MG tablet TAKE 1 TABLET BY MOUTH EVERY DAY AS NEEDED 90 tablet 3  .  methylPREDNISolone (MEDROL) 4 MG tablet Take as directed 21 tablet 0  . montelukast (SINGULAIR) 10 MG tablet Take 1 tablet (10 mg total) by mouth at bedtime. 90 tablet 4  . nitroGLYCERIN (NITROSTAT) 0.4 MG SL tablet PLACE 1 TABLET (0.4 MG TOTAL) UNDER THE TONGUE EVERY 5 (FIVE) MINUTES AS NEEDED. FOR UP TO 3 DOSES. 25 tablet 3  . pantoprazole (PROTONIX) 40 MG tablet TAKE 1 TABLET BY MOUTH EVERY DAY 30 MINUTES BEFORE DINNERTIME 90 tablet 0  . VIAGRA 100 MG tablet TAKE 1 TABLET BY MOUTH AS NEEDED FOR ERECTILE DYSFUNCTION. 15 tablet 0   No current facility-administered medications for this visit.     Allergies  Allergen Reactions  . Ace Inhibitors Swelling  . Angiotensin Receptor Blockers Swelling  . Aspirin Hives  . Codeine Nausea And Vomiting  . Irbesartan Other (See Comments)    REACTION: allergic to ARBs w/ angioedema  . Ramipril Other (See Comments)    REACTION: Allergic to ACE's w/ angioedema...    Social History   Social History  . Marital status: Married    Spouse name: N/A  . Number of children: 1  . Years of education: N/A   Occupational History  . [employer] [place] History Main Topics  . Smoking status: Former Smoker    Packs/day: 1.00    Years: 20.00    Types: Cigarettes, Cigars    Quit date: 05/08/1998  . Smokeless tobacco: Never Used     Comment: quit in 2005  . Alcohol use 0.6 oz/week    1 Standard drinks or equivalent per week     Comment: social drinker  . Drug use: No  . Sexual activity: Yes   Other Topics Concern  . Not on file   Social History Narrative  . No narrative on file    Family History  Problem Relation Age of Onset  . Colon cancer Father   . Stroke Mother   . Heart disease Mother   . Hypertension Sister   . Diabetes Sister   . Colon cancer Paternal Uncle     Review of Systems:  As stated in the HPI and otherwise negative.   BP 110/70   Pulse 63   Ht 5\' 10"  (1.778 m)   Wt 222 lb (100.7 kg)   SpO2  98%   BMI 31.85 kg/m   Physical Examination:  General: Well developed, well nourished, NAD  HEENT: OP clear, mucus membranes moist  SKIN: warm, dry. No rashes. Neuro: No focal deficits  Musculoskeletal: Muscle strength 5/5 all ext  Psychiatric: Mood and affect normal  Neck: No JVD, no carotid bruits, no thyromegaly, no lymphadenopathy.  Lungs:Clear bilaterally, no wheezes, rhonci, crackles Cardiovascular: Regular rate and rhythm. No murmurs, gallops or rubs. Abdomen:Soft. Bowel sounds present. Non-tender.  Extremities: 1+  bilateral lower extremity edema. Pulses are 2 + in the bilateral DP/PT.  Echo 08/30/13: Left ventricle: The cavity size was normal. Systolic function was normal. The estimated ejection fraction was in the range of 55% to 60%. Probable mild hypokinesis of the basal-midinferolateral and inferior myocardium. Doppler parameters are consistent with abnormal left ventricular relaxation (grade 1 diastolic dysfunction). - Mitral valve: Calcified annulus.  Cardiac cath 06/17/16:  Ost Cx to Mid Cx lesion, 20 %stenosed.  Ost LM lesion, 40 %stenosed.  1st Diag lesion, 40 %stenosed.  The left ventricular systolic function is normal.  LV end diastolic pressure is normal.  The left ventricular ejection fraction is greater than 65% by visual estimate.  There is no mitral valve regurgitation.     EKG:  EKG is not ordered today. The ekg ordered today demonstrates   Recent Labs: 03/30/2016: TSH 1.78 07/13/2016: B Natriuretic Peptide 213.7 07/22/2016: ALT 38; Pro B Natriuretic peptide (BNP) 59.0 08/20/2016: BUN 15; Creatinine, Ser 1.07; Hemoglobin 13.6; Platelets 145.0; Potassium 4.6; Sodium 140   Lipid Panel    Component Value Date/Time   CHOL 128 03/30/2016 1056   TRIG 59.0 03/30/2016 1056   HDL 44.30 03/30/2016 1056   CHOLHDL 3 03/30/2016 1056   VLDL 11.8 03/30/2016 1056   LDLCALC 72 03/30/2016 1056     Wt Readings from Last 3 Encounters:  12/22/16 220 lb  (99.8 kg)  12/22/16 222 lb (100.7 kg)  08/20/16 216 lb 0.6 oz (98 kg)     Other studies Reviewed: Additional studies/ records that were reviewed today include: . Review of the above records demonstrates:    Assessment and Plan:  1. CAD without angina: No chest pain. He has mild to moderate CAD by cath in March 2018. Will continue Plavix, statin, Norvasc, beta blocker.    2. HYPERTENSION:  BP controlled. No changes.    3. HYPERLIPIDEMIA: LDL at goal. Continue statin  4. Chronic diastolic CHF: he has mild LE edema on Lasix 40 mg daily. I have asked him to make sure he takes the Lasix every day. He can use additional Lasix as needed for swelling. Elevate legs while sitting. He has compression stockings. BP is controlled.    Current medicines are reviewed at length with the patient today.  The patient does not have concerns regarding medicines.  The following changes have been made:  no change  Labs/ tests ordered today include:  No orders of the defined types were placed in this encounter.   Disposition:   F/U with me 12 months  Signed, Lauree Chandler, MD 12/23/2016 7:16 AM    [facility] [address], [place], [place]  [address] Phone: [phone]; Fax: [phone]

## 2016-12-26 ENCOUNTER — Other Ambulatory Visit: Payer: Self-pay | Admitting: Cardiovascular Disease

## 2016-12-26 ENCOUNTER — Other Ambulatory Visit: Payer: Self-pay | Admitting: Pulmonary Disease

## 2016-12-27 ENCOUNTER — Ambulatory Visit: Payer: Medicare Other | Admitting: Pulmonary Disease

## 2016-12-27 ENCOUNTER — Encounter: Payer: Self-pay | Admitting: Pulmonary Disease

## 2016-12-27 VITALS — BP 124/82 | HR 62 | Temp 97.6°F | Ht 70.0 in | Wt 217.2 lb

## 2016-12-27 DIAGNOSIS — R768 Other specified abnormal immunological findings in serum: Secondary | ICD-10-CM

## 2016-12-27 DIAGNOSIS — I2510 Atherosclerotic heart disease of native coronary artery without angina pectoris: Secondary | ICD-10-CM

## 2016-12-27 DIAGNOSIS — J449 Chronic obstructive pulmonary disease, unspecified: Secondary | ICD-10-CM | POA: Diagnosis not present

## 2016-12-27 DIAGNOSIS — I5032 Chronic diastolic (congestive) heart failure: Secondary | ICD-10-CM

## 2016-12-27 DIAGNOSIS — I10 Essential (primary) hypertension: Secondary | ICD-10-CM

## 2016-12-27 NOTE — Progress Notes (Signed)
Subjective:    Patient ID: Larry Stone, male    DOB: [birth year], 66 y.o.   MRN: [id number]  HPI 66 y/o WM here for a followup visit... he has multiple medical problems as noted below...  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 and DrPlotnikov for Primary Care- Chol, Impaired fasting glucose, etc... ~  SEE PREV EPIC NOTES FOR OLDER DATA >>    CT Chest 01/2010>>  1.43mm right middle lobe lung nodule is similar back to the abdominal CT of 02/16/2003, consistent with benignity, most likely a subpleural lymph node;  2. No acute process in the chest;  3 Distal esophageal wall thickening again suggests esophagitis;  4.Mild centrilobular emphysema;  5.Age advanced atherosclerosis.  CXR 1/14 showed normal heart size, clear lungs, chr right rib deformity (old fx), NAD...  LABS 1/14:  FLP- at goals on Lip40+Niasp;  Chems- wnl;  CBC- wnl;  TSH=1.93;  PSA=0.39;  Testos=307 (350-890) on Testim 2tubes/d;  B12=1005 (211-911) on B12 2052mcg/d...  LABS 4/15:  FLP- at goals on Lip40;  Chems- ok w/ BS=115, A1c=6.0, Cr=1.2;  CBC- wnl;  TSH=1.55;  PSA=0.39...   CXR 8/15 showed normal heart size, clear lungs w/o infiltrates, old healed right rib fxs, NAD...   PFT 8/15 showed FVC=2.32 (50%), FEV1=1.63 (45%), %1sec=70, mid-flows=33% predicted; suggestive of GOLD Stage 3 COPD but can't r/o superimposed restriction w/o lung volume measurement...  LABS 8/15:  SPE/IEP w/o monoclonal gammopathy; Quant Ig's showed normal IgG, normal IgA, low IgM at 26 (40-250), and a hi IgE at 1362 => we will follow w/ RAST panel & consider Xolair Rx...  RAST Panel 11/15 => returned w/ IgE level = 1497 and Allergen panel pos for some molds, grasses, trees, ragweed, cats>dogs; and Food panel pos for Milk> Wheat> Shrimp/ Tomato/ Peanut, etc.  ~  March 18, 2014:  33mo ROV & f/u dyspnea> Larry Stone states that the [facility] was  $400 at the Pharm & they/he didn't call for replacement inhaler, states he filled it one time & ran out several wks ago, we discussed what to do in that eventuality (call us to let us know & get a different Rx), we will try SYMBICORT160-2spBid & he will let us know what his [employer] co-pay is for this med;  Similarly he did not return to get the RAST tests done, but he informs me that they were drawn several days ago w/ blood work for [employer]- results pending...  We reviewed his prev Hx/ symptoms, exam (chest is clear today), and evaluation; all questions answered, we decided to try the Symbicort160 regularly over the next 27mo & plan f/u Full-PFTs in the future;  In the meanwhile we will await the results of the RAST tests and decide regarding Xolair vs Allergy evaluation...    We reviewed prob list, meds, xrays and labs> see below for updates >> He was given the 2015 flu vaccine recently and the Prevnar-13 vaccination as well...  LABS 11/15:  FLP- at goals on Lip40;  Chems- wnl w/ BS=105, A1c=6.1;  CBC- wnl w/ Hg=15.1;  TSH=1.77, PSA=0.50...   RAST Test 11/15:  returned w/ IgE level = 1497 and Allergen panel pos for some molds, grasses, trees, ragweed, cats>dogs; and Food panel pos for Milk> Wheat> Shrimp/ Tomato/ Peanut, etc... We discussed avoidance strategies and other options- he is interested in Xolair & we will proceed w/ the paperwork for Xolair  approval...   ~  October 15, 2014:  30mo ROV & Larry Stone is still awaiting insurance approval for Xolair!  He reports removing 2 ticks from his privates recently!  Weight down 12# on diet and he notes some improvement in his dyspnea- now just noted w/ intense activ he says eg- climbing/ stairs/ etc but walking is ok...     OSA> s/p UPPP surg 2004; uses Transderm scope prn dizziness, uses "breathe right nasal strips" periodically; resting OK, no daytime hypersomnolence...    COPD & HxBronchitis> on Symbicort160-2Bid & Zyrtek prn; doing satis w/o cough,  phlegm, etc...    Elev IgE at 1362 & 1497; see RAST results above;  We are awaiting approval for Xolair...    His last Cards check was 12/25 w/ DrMcAlhany> Hx MI in 2004 w/ resid 50% Lmain; had CardiacCT 2011 w/ 50% LAD, Myoview 2015 w/ inferolat scar, no ischemia, EF=52%; clinically stable & not c/o CP & no changes made...    He had medical f/u DrPlotnikov 5/16> HBP, CAD, HL, anxiety- all stable on meds and no changes made...    He continues to f/u w/ Podiatry- DrRegal> seen 4/16 for heel pain, plantar faciitis- injected & given brace + oral anti-inflamm rx... We reviewed prob list, meds, xrays and labs> see below for updates >>  ADDENDUM>>  [name] was denied by his insurance company...  ~  April 04, 2015:  2mo ROV & Larry Stone called for an add-on appt >  He is c/o a 7mo hx sudden onset SOB/DOE, fatigue, cough & chest congestion w/ sl beige phlegm, no hemoptysis, no CP x sore from coughing, and no f/c/s;  We called in Pred dosepak but he says no better, not resting, ?cough worse Qhs, incr SOB & he denies any reflux/ GERD, etc... He has been using the Symbicort160-2spBid & Zyrtek10... EXAM shows Afeb, VSS, O2sat=98% on RA;  HEENT- neg, mallamapti2;  Chest- decr BS at bases, dry cough, clear w/o w/r/r;  Heart- RR w/o m/r/g;  Abd- soft, neg;  Ext- neg w/o c/c/e...   CXR 04/04/15 showed norm heart size, clear lungs, old right rib fx, NAD...  IMP/PLAN>>  Larry Stone appears to have a refractory COPD exac & we discussed additional treatment w/ ZPak, Depo80/Medrol8mg  tapering sched, add INCRUSE one sp daily, Mucinex 1200mg Bid + fluids, and OK to use mother's old NEB machine  W/ Albut Tid;  Also wrote for [facility] for prn use...  ~  May 01, 2015:  22mo ROV & Larry Stone reports that he is better overall from his refractory AB but cough is lingering w/ a definite night-time pattern;  Notes some wheezing & chest soreness;  He has been on mother's NEB machine Bid, Symbicort160-2spBid, Incruse daily, Medrol8 taper- down to  1/2Qod, Hycodan prn;  We decided to continue the Medrol4mg Qod & institute a vigorous antireflux regimen- Protonix40 taken 57min before dinner, NPO after dinner, elev HOB 6"... EXAM shows Afeb, VSS, O2sat=98% on RA;  HEENT- neg, mallamapti2;  Chest- decr BS at bases, min cough & exp wheezing w/o consolidation;  Heart- RR w/o m/r/g;  Abd- soft, neg;  Ext- neg w/o c/c/e...  IMP/PLAN>>  Larry Stone is improved but w/ persist symptoms esp cough at night- we decided to continue the Medrol4mg  Qod & institute a vigorous antireflux regimen... Plan ROV 6-8wks.  ~  June 30, 2015:  5mo ROV & Larry Stone reports that his breathing is better, min chest congestion & non-productive cough, still notes occas wheezing; no CP, no tightness, no f/c/s;  NOTE>  HE HAS STOPPED THE MEDROL& STOPPED THE NEBS on his own, still using the Symbicort & Incruse regularly; rec to add MUCINEX600-2Bid w/ fluids & [facility]...  EXAM shows Afeb, VSS, O2sat=98% on RA;  HEENT- clears throat/ ?VCD, mallamapti2;  Chest- decr BS at bases, min cough & exp wheezing w/o consolidation;  Heart- RR w/o m/r/g;  Abd- soft, neg;  Ext- neg w/o c/c/e...  IMP/PLAN>>  We will refer to ENT for completeness & upper airway symptoms, throat clearing, etc; he needs to maintain a vigorous antireflux regimen + Symbicort/ Incruse/ Albut NEBS...   ~  August 12, 2015:  6wk ROV & add-on appt requested due to incr dyspnea>  Larry Stone reports that he was doing satis on the Symbicort160-2spBid & Incruse daily (having prev stopped the NEB rx and Medrol) but called yest w/ temp 103 he says, felt "out of it" w/ chills, aching/sore, fatigued/ weak and incr cough w/o much sput production;  He says he saw ENT in the interim- nothing wrong, no change in rx recommended (we do not have note from them);  Note- his wt is down 11# to 211# today...    OSA> s/p UPPP surg 2004; uses Transderm scope prn dizziness, uses "breathe right nasal strips" periodically; resting OK, no  daytime hypersomnolence...    COPD & HxBronchitis> on Symbicort160-2Bid & Incruse daily; has his mother's old NEB machine & using Albut prn;  Now c/o dry cough, T103, etc (as above)=>     Elev IgE at 1362 & 1497; see RAST results above;  His insurance denied Xolair rx; he has been on steroids and uses Zyrtek10...    His last Cards check was 05/08/15 w/ DrMcAlhany> Hx MI in 2004 w/ resid 50% Lmain; had CardiacCT 2011 w/ 50% LAD, Myoview 2015 w/ inferolat scar, no ischemia, EF=52%;  Myoview 6/15 showed inferolat scarno ischemia, EF=52%; clinically stable & not c/o CP & no changes made...    He had medical f/u DrPlotnikov 5/16> HBP, CAD, HL, anxiety- all stable on meds and no changes made...    He continues to f/u w/ Podiatry- DrRegal> seen 4/16 for heel pain, plantar faciitis- injected & given brace + oral anti-inflamm rx... EXAM shows Afeb, VSS, O2sat=92% on RA;  HEENT- clears throat/ ?VCD, mallamapti2;  Chest- decr BS at bases, min cough & no w/r/r/ or consolidation;  Heart- RR w/o m/r/g;  Abd- soft, neg;  Ext- neg w/o c/c/e...   CXR 08/12/15> norm heart size, no acute pulm infiltrates/ effusion/ etc, right lat pleural thickening; degen changes in spine...  LABS 08/12/15> Chems- wnl  X BS=121, Cr=1.2, TBili=1.8;  BNP= 35;  CBC- ok x WBC=15.7, Hg=14.9;  Sed=41; IMP/PLAN>>  We decided to treat w/ LEVAQUIN x7d, Depo80, PRED20mg - 7d tapering sched; use NEB w/ DUONEB Tid followed by Symbicort & Incruse; continue to follow the vigorous antireflux regimen; ROV in 3 weeks time...  ~  Sep 03, 2015:  3wk ROV & Larry Stone reports some improvement on his regimen of PRED20mg -1/2 tab Qam now, NEBs w/ Duoneb Tid, Symbicort160-2spBid, Incruse daily; he notes "improved, but not well" w/ CC= dry cough, in paroxysms, hacking, small amt thick sputum, no f/c/s, no CP, no SOB x w/ the coughing spells; he notes that he coughs in restaurants, notes the coughing spells occur w/o rhyme or reason, better if he wals outside...  EXAM shows  Afeb, VSS, O2sat=98% on RA;  HEENT- neg, mallamapti2;  Chest- decr BS at bases, clear, no w/r/r/ or  consolidation;  Heart- RR w/o m/r/g;  Abd- soft, neg;  Ext- neg w/o c/c/e...  IMP/PLAN>>  Larry Stone needs a new NEBULIZER machine; he is asked to wean the Pred from 10mg /d to 10mg  alternating w/ 5mg  QOD; contuinue the Duoneb tid, Symbicort-2Bid, Incruse daily, Hycodan prn; he needs to redouble his efforts at the antireflux regimen;  We will refer to DrKozlow for his assessment of refractory asthmatic bronchitis- recall the RAST was abn 2015 & IgE level ~1400 but insurance denied Xolair... NOTE>>  We gave him the second Pneumovax 23 today & this gets him up-to-date on his immuniz.  ~  October 15, 2015:  6wk ROV & Larry Stone has established w/ DrKozlow for allergy/immunology work up & to try & get him approved for Xolair;  He has done quite a bit of reading on the internet & asked several good questions about his condition- answered to the best of my ability;  Currently taking Symbicort160-2spBid, Incruse once daily, Pred 10mg  tabs tapering (tapering off more quickly on DrKozlow's protocol), Singulair10, NEBS w/ ?Albut2.5 prn (we had him on Duoneb Tid), Zyrtek10, Hycodan prn;  DrKozlow has started the paperwork for Xolair shots thru his new insurance;  He reports that his breathing is better- sl cough, sm amt beige sput, no hemoptysis, improved SOB, no CP, no recent f/c/s, etc...    EXAM shows Afeb, VSS, O2sat=97% on RA;  HEENT- clears throat min/ ?VCD, mallamapti2;  Chest- decr BS at bases, no cough & no w/r/r;  Heart- RR w/o m/r/g;  Abd- soft, neg;  Ext- neg w/o c/c/e...   Spirometry x2 by DrKozlow 6/13 & 6/27 showed FEV1 improved 33% to 1.98L (61% pred) on his regimen...  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 IMP/PLAN>>  COPD w/ Asthma, improved w/ adjustments from DrKozlow, they are continuing his COPD meds Symbicort/ Incruse), added Singulair, and changed Duoneb to albut prn; he has  also highlighted the need for anti-reflux regimen (ProtonixAM & Ranitadine-PM); hoping for approval of Xolair, they plan f/u in 2-75mo and he will call us in the interim for any issues...  ~  July 22, 2016:  24mo ROV & post hospital visit>  I last saw Larry Stone ~27mo ago- see above, he has established w/ DrKozlow for an allergy/ immunology work up & DrPlotnikov for PCP>>    DrKozlow's eval revealed mixed COPD/Emphysema w/ revers (asthmatic) component, allergic rhinitis, LPRD; he weaned Larry Stone off Pred, continued Symbicort, stopped Incruse, continued Singulair/ Rhinocort, AlbutHFA & had him stop the NEBS; they submitted for Xolair but cost from his insurance was prohibitive, continued his antireflux regimen & allergen avoidance etc... Pt did not f/u w/ DrKozlow after 12/2015 visit...     Pt had general medical check w/DdrPlotnikov in [id number]- notes reviewed...    Then in Feb2018 he saw DrMcAlhany in [place] for [place]- known HBP, CAD- s/p IWMI 2004 w/ med rx, HL;  Notes reviewed- pt had CATH 06/17/16 showing nonobstructive CAD w/ 30-40% Lmain; norm LV sys function, elev filling pressures=> started Lasix40 daily... He hasn't seen Cards back in the office yet...    Then he saw PCP back 06/30/16> ?what was going on? c/o arthralgias? They stopped his Symbicort?    He presented to the ER 07/13/16 w/ dry cough, incr SOB, wheezing, hypoxemic and Temp to 103;  He was felt to be septic, given 3L IVF, no sputum, BC neg, only pos titer was FluB; Treated w/ O2, Levaquin/ Vanco empirically + Tamiflu &  Solumedrol;  CXR showed sl incr markings, no infiltrate; subseq CT Chest showed bilat patchy GGO thought c/w infection;  He was disch home on 3/30 w/ Doxy, Tamiflu, Pred10/d, NEBS, Symbicort160, Singulair & GFN...   CXR 07/13/16 showed decr lung vols, mild peribronch cuffing but no consolidative airsp dis, +Ao atherosclerosis, otherw neg...  EKG 07/13/16>  NSR, rate92, RBBB, no acute changes...  CT Chest 07/14/16 showed norm heart size &  Ao and coronary atherosclerosis, no pathologically enlarged LNs, patchy multifocal GGO in RML, RLL, LLL, no masses, upper abd & bones all neg...  LABS 06/2016>  Chems- ok x BS120-160, Cr was ok at 1.00;  Lactate was elev at 2.77 & PCT was elev at 0.77;  CBC was ok w/ Hg=12.2, mcv=94, WBC=10.2    OSA> s/p UPPP surg 2004; not on CPAP; uses Transderm scope prn dizziness, uses "breathe right nasal strips" periodically; resting OK, no daytime hypersomnolence...    COPD w/ revers component & Hx recurrent bronchitic infections in the past> DrKozlow had stopped most of his meds in Sep2017, DrPlot stopped Symbicort 12/17; he developed FluB pneumonitis 3/18 as above=> still feeling weak, fatigued, cough w/ sm amt yellow sput, SOB/DOE w/ ADLs now; no f/c/s, no CP/ palpit, 1+edema in LEs...    Elev IgE at 1362 & 1497; see RAST results;  His 1st insurance denied Xolair rx; 2nd insurance approved it but co-pay was too high...    CARDS issues> followed by Vivi Barrack- Hx MI in 2004 w/ resid 50% Lmain; had CardiacCT 2011 w/ 50% LAD, Myoview 2015 w/ inferolat scar, no ischemia, EF=52%;  Cathed 06/2016 for incr DOE w/ 30-40% Lmain otherw nonobstructive dis, norm sys function but elev filling pressures=> DrMcAlhany started Lasix40 but this was stopped w/ [facility] 3/18...    Medical issues> followed by DrPlotnikov- HBP, CAD, HL, anxiety    He continues to f/u w/ Podiatry- DrRegal> seen 4/16 for heel pain, plantar faciitis- injected & given brace + oral anti-inflamm rx...    EXAM shows Afeb, VSS, O2sat=93% on RA;  HEENT- s/p UPPP, ?VCD, mallamapti2;  Chest- decr BS at bases, mild cough, rales right base, scat rhonchi, no consolidation;  Heart- RR gr1/6 SEM w/o r/g;  Abd- soft, neg;  Ext- 1+edema w/o c/c;  Neuro- intact w/o focal deficits...  CXR 07/22/16>  Sl incr markings in medial LLL but no progressive opacity, effusions, etc...  Ambulatory oximetry 07/22/16>  O2sat=93% on RA at rest w/ pulse=61/min;  He ambulated 2 laps in office  (185'ea) & stooped due to fatigue w/ lowest O2sat=91% w/ pulse=73/min...  LABS 07/22/16>  Chems- ok w/ BS=89, Cr=1.18, LFTs wnl;  CBC- ok w/ Hg=15.3, WBC=13.6;  BNP=59;  Sed=18... IMP/PLAN>>  Larry Stone appears to have had a viral pneumonia & his serology was pos for FluB; no bacterial etiology established;  This has flared his COPD/ reactive airways component requiring reinstitution of his NEBS Tid, Symbicort160-2spBid, Singulair, and MUCINEX 1200mg  bid w/ fluids;  We placed him back on a slow Medrol taper for the airway inflammation;  Asked to rest at home w/ fluids but NO SALT & we plan rov recheck in 2-3 weeks... Note: >50% of this 96min ov was spent in counseling & coordination of care...  ~  August 12, 2016:  3wk ROV & pulm recheck>  Larry Stone returns and indicates a great response to prev Rx, feeling much better- SOB improved, & able to get about more now;  Last OV 07/22/16 c/o 33mo hx COPD exac (?FluB) including Hosp  &  we treated him w/ MEDROL 8mg Bid- slow taper, AlbutNEB Tid, Symbicort160-2spBid, Singulair10, Mucinex, fluids;  He took his last Medrol 1/2 tab recently & has continue the other med regularly;  He is concerned because he is going on a trip to [place] soon- cruise, train, bus, etc => we agreed to write for Levaquin500, Medrol Dosepak, & ProairHFA for prn needs...     EXAM shows Afeb, VSS, O2sat=98% on RA;  HEENT- s/p UPPP, ?VCD, mallamapti2;  Chest- decr BS at bases, clear now, no consolidation;  Heart- RR gr1/6 SEM w/o r/g;  Abd- soft, neg;  Ext- tr+edema w/o c/c;  Neuro- intact w/o focal deficits... IMP/PLAN>>  Larry Stone is much improved s/p COPD exac likely viral flu mediated AB;  Off Ab/s & finished Medrol- he is asked to remain active, & take his NEB Tid (slowly wean), Symbicort Bid, Singulair, Mucinex, fluids;  As noted we gave him Levaquin, Medrol dosepak, Proair for his upcoming trip to [place]...   ~  December 27, 2016:  4-25mo ROV & pulmonary follow up visit>  He reports a good 5mo interval w/o new  complaints or concerns, notes min wheezing "if I'm dusting" but no asthma attacks; using NEBS prn, Symbicort160-2spBid, Singulair10 Qd; he wants to decr the Symbicort to 2sp once daily to save $$ since he is doing so well, we discussed the need to bump back up to 2spBid at first sign of asthma/COPD breathing trouble...  We reviewed the following interval Epic notes >>     He saw CARDS-DrMcAlhany on 12/22/16>  HBP, CAD- s/p MI 0258, chr diastolic CHF, HL, & OSA; Cath 06/17/16 showed 40% Lmain, mild dis in Diagonal & Circ; not c/o chest discomfort- continue same meds, f/u 80yr...    He saw PCP-DrPlotnikov on 12/22/16>  HBP, CAD, anxiety (on Wellbutrin & Xanax); note reviewed, chest was clear & heart sounds wnl; doing well- no changes made...  We reviewed the following medical problems during today's office visit >>     OSA> s/p UPPP surg 2004; not on CPAP; uses Transderm scope prn dizziness, uses "breathe right nasal strips" periodically; resting OK, wakes refreshed, no daytime hypersomnolence...    COPD w/ revers component & Hx recurrent bronchitic infections in the past> Last FEV1 2015 was 1.63 (45%); DrKozlow had stopped most of his meds in Sep2017, DrPlot stopped Symbicort 12/17; he developed FluB pneumonitis 3/18 => w/ persistent weak, fatigued, cough w/ sm amt yellow sput, SOB/DOE w/ ADLs; no f/c/s, no CP/ palpit, 1+edema in LEs=> we restarted NEBS, Symbicort160-2spBid, Singulair10, Zyrtek10...    AR & Elev IgE at [place]; see RAST results;  His 1st insurance denied Xolair rx; 2nd insurance approved it but co-pay was too high; he has been eval by DrKozlow, could not afford Xolair but fortunately he has not had any COPD exac since 06/2016 [facility]...    CARDS issues> followed by Vivi Barrack- Hx MI in 2004 w/ resid 50% Lmain; had CardiacCT 2011 w/ 50% LAD, Myoview 2015 w/ inferolat scar, no ischemia, EF=52%;  Cathed 06/2016 for incr DOE w/ 30-40% Lmain otherw nonobstructive dis, norm sys function but elev filling  pressures=> DrMcAlhany started Lasix40 but this was stopped w/ [facility] 3/18...    Medical issues> followed by DrPlotnikov- HBP, CAD, HL, anxiety    He continues to f/u w/ Podiatry- DrRegal> seen 4/16 for heel pain, plantar faciitis- injected & given brace + oral anti-inflamm rx... EXAM shows Afeb, VSS, Wt=217#, BMI=31, O2sat=98% on RA;  HEENT- s/p UPPP, ?VCD, mallamapti2;  Chest-  decr BS at bases, clear now, no consolidation;  Heart- RR gr1/6 SEM w/o r/g;  Abd- soft, neg;  Ext- tr+edema w/o c/c;  Neuro- intact w/o focal deficits...  Spirometry 12/2015 by DrKozlow>  FVC=2.57 (60%), FEV1=2.06 (60%), %1sec=80, mid-flows reduced at 55% predicted...  CXR 08/20/16>  Norm heart size, no adenopathy, clear lungs- no edema or consolidation, degen changes in Tspine, old healed right rib fxs...  LABS in Epic 08/2016>  Chems- wnl;  CBC- wnl (3%eos);  Sed=18 IMP/PLAN>>  Larry Stone is stable on his Symbicort & Singulair, he wants to decr the Symbicort to 2sp once daily- while I'd prefer he continue the Bid dosing, he is adamant about the reduction- reminded to bump back up to 2spBid + NEBS at first sign of any resp problem;  He had the 2018 Flu she by DrPlotnikov & the 1st shingRix shot (c/o low grade temp, musc aches & pains after this vax)...  NOTE:  >50% of this 60min rov was spent in counseling & coordination of care...            Problem List:     ALLERGIC to ASA w/ hives, & ACE inhibitors/ ARBs too w/ angioedema.  OBSTRUCTIVE SLEEP APNEA (ICD-327.23) - s/p UPPP surgery in 2004 by DrRedman...  states he's doing OK, denies snoring, no daytime hypersom, wife not complaining... uses ZYRTEK for allergy symtoms. ~  1/13: He has started using the "breathe right nasal strips" qhs for recurrent snoring; still says he rests well, denies daytime hypersomnolence, & declines offer for repeat sleep study to check... ~  7/14:  He reports resting well, no daytime symptoms or sleep issues...  ~  8/15:  He denies sleep disordered  breathing... ~  6/16:  He remains asymptomatic- no snoring, daytime hypersomnolence, etc...  Hx of BRONCHITIS, RECURRENT (ICD-491.9) - he is an ex-smoker, prev 1 ppd for 76yrs, quit in 2004. COPD, mixed type- w/ mild centrilobular emphysema on CT Chest 10/11, & chronic bronchitis clinically w/ recurrent infections... ~  Hx tiny RML nodule seen on Cardiac CT 2/11 & apparently unchanged from old [place] 2004... ~  10/11: f/u CT Chest for f/u tiny 80mm nodule RML- unchanged xyrs & benign, mild centilob emphysema, +coronary calcif, NAD... ~  1/13: he denies breathing problems, intercurrent infections, etc... ~  1/14:  CXR 1/14 showed normal heart size, clear lungs, chr right rib deformity (old fx), NAD.  ~  8/15: he was concerned about several recent bouts of bronchitis requiring ZPak for Rx> we checked labs and Immunoglobulins =>   Decided to start inhaler w/ DULERA100-2spBid...  ~  CXR 8/15 showed normal heart size, clear lungs w/o infiltrates, old healed right rib fxs, NAD. ~  PFT 8/15 showed FVC=2.32 (50%), FEV1=1.63 (45%), %1sec=70, mid-flows=33% predicted; suggestive of GOLD Stage 3 COPD but can't r/o superimposed restriction w/o lung volume measurement. ~  LABS 8/15 showed SPE/IEP w/o monoclonal gammopathy; Quant Ig's showed normal IgG, normal IgA, low IgM at 26 (40-250), and a hi IgE at 1362 => we will follow w/ RAST panel & consider Xolair Rx. ~  11/15: he didn't fill the [facility] due to cost- we decided to try SYMBICORT160-2spBid... ~  LABS 11/15 showed elev IgE level and mixed RAST panel allergens; discussed avoidance & he is a good cand for [employer] => insurance would NOT approve this med... ~  12/16: on Symbicort160-2spBid + using his mother's nebulizer +Albut; presented w/ refractory COPD exac=> treated w/ ZPak, Medrol taper, add Incruse & use NEB Tid, mucinex +  Hycodan...  ~  1/17: on mother's NEB machine Bid, Symbicort160-2spBid, Incruse daily, Medrol8 taper- down to 1/2Qod, Hycodan prn;  We  decided to continue the Medrol4mg Qod & institute a vigorous antireflux regimen- Protonix40 taken 56min before dinner, NPO after dinner, elev HOB 6". ~  3/17:  He is improved and backed off on many of his meds... ~  4/17:  He presents w/ recurrent AB => treated w/ Levaquin, Prednisone20mg - 7d tapering sched, NEBs w/ DUONEB Tid followed by Symbicort160 & Incruse... ~  6/17>  He had Allergy eval from DrKozlow... ?approved for Xolair by his co-pays were prohibitive... ~  3/18>  He was Adm w/ apparent FluB pneumonitis & acute hypoxemic resp failure => responded to O2, broad spectrum Ab's + Tamiflu, Solumedrol, NEBS, Symbicort, etc... ~  4/18>  Persistent AB symptoms- given more Medrol (slower taper) + continue other Rx=> improved & back to baseline.   MEDICAL PROBLEMS PER DrPlotnikov >>   HYPERTENSION (ICD-401.9) - on COREG 12.5mg Bid, AMLODIPINE 5mg /d, & LASIX 40mg - only as needed (& he hasn't needed any); BP= 116/72 & he denies CP, palpit, ch in SOB, edema...  ARTERIOSCLEROTIC HEART DISEASE (ICD-414.00) - on PLAVIX 75mg /d, allergic to ASA... followed by [facility] and seen 6/15 (note reviewed)... 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 ~  f/u Cardiac CT 2/11 showed>  Calcified plaque in the left main with < 50% stenosis.  Calcified plaque in the proximal and mid LAD with at most moderate (around 50%) stenosis in the mid LAD. ~  Vasc Screen per Cards 10/11 showed mild carotid plaques, norm AbdAo & ABIs... ~  CATH 05/2016 by Vivi Barrack w/ 30-40% Lmain, nonobstructive dis in 1st diag 7 Circ, norm sys function 7 elev filling pressures=> med rx w/ Lasix40 added...  HYPERLIPIDEMIA (ICD-272.4) - on LIPITOR 40mg /d, & CoQ10... Last FLP 11/15 on Lip40 showed TChol 153, TG 42, HDL 53, LDL 92  DIABETES MELLITUS, BORDERLINE (ICD-790.29) - on diet alone... Labs 11/15- weight= 224#, BS=105, A1c=6.1  GERD (ICD-530.81) - he uses H2 blockers as needed.  DIVERTICULOSIS OF  COLON (ICD-562.10) & COLONIC POLYPS (ICD-211.3) - his last polyps were ~1mm and removed in 2004= adenomatous... there is a +fam hx of colon cancer in his father who died at age 44... ~  Last colonoscopy 5/14 by DrKaplan (done w/ pt on Plavix rx)> mild divertics & 66mm polyp in desc colon removed (tubular adenoma) & repeat rec in 5 yrs.   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 ~  3/15: he is off the prev Testos replacement Rx> states he feels well, good energy, PSA=0.39  DEGENERATIVE JOINT DISEASE (ICD-715.90) - he reports doing fair- mostly c/o knees & hands ("I messed up my knee in a fall")... he saw Ortho ?who? on Glucosamine/ Chondroitin supplements.  LOW BACK PAIN SYNDROME (ICD-724.2)  MEMORY LOSS >>  ~  1/13: he states that his "memory is fuzzy" & "Clarity is missing"; occas he will swear that he said something but he really didn't; he's had prev scans etc & I have recommended a Neurology eval for his memory but he declines at this time "I'll think about it" he said...  ANXIETY (ICD-300.00) - on WELLBUTRIN 150mg Bid, & ALPRAZOLAM 0.5mg  Prn... increased stress w/ mother's stroke last year... he's the main care giver w/ 2 sisters who Larry Stone't help much...   Health Maintenance: ~  Immuniztions:  he is encouraged to get the yearly Flu  vaccine... given PNEUMOVAX 2/12 & PREVNAR-13 given 11/15... He reports a lot of travel & he's been to the travel clinic & had "every vaccine known to man"> TDAP 7/14 and Zoster 5/14...   Past Surgical History:  Procedure Laterality Date  . CARDIAC CATHETERIZATION    . LEFT HEART CATH AND CORONARY ANGIOGRAPHY N/A 06/17/2016   Procedure: Left Heart Cath and Coronary Angiography;  Surgeon: Burnell Blanks, MD;  Location: [facility] [place] [facility];  Service: Cardiovascular;  Laterality: N/A;  . UVULOPALATOPHARYNGOPLASTY     surgery for OSA    Outpatient Encounter Prescriptions as of 12/27/2016  Medication Sig  .  albuterol (PROAIR HFA) 108 (90 Base) MCG/ACT inhaler Inhale 1-2 puffs into the lungs every 6 (six) hours as needed for wheezing or shortness of breath.  Marland Kitchen albuterol (PROVENTIL) (2.5 MG/3ML) 0.083% nebulizer solution Use one vial in the nebulizer every 4-6 hours if needed for cough or wheeze  . ALPRAZolam (XANAX) 0.5 MG tablet TAKE 1 TABLET BY MOUTH THREE TIMES A DAY AS NEEDED  . amLODipine (NORVASC) 5 MG tablet TAKE 1 TABLET BY MOUTH EVERY DAY  . ammonium lactate (LAC-HYDRIN) 12 % lotion Apply topically as directed.  Marland Kitchen atorvastatin (LIPITOR) 40 MG tablet TAKE 1 TABLET BY MOUTH EVERY DAY  . budesonide-formoterol (SYMBICORT) 160-4.5 MCG/ACT inhaler INHALE 2 PUFFS INTO THE LUNGS 2 (TWO) TIMES DAILY.  . carvedilol (COREG) 12.5 MG tablet TAKE 1 TABLET BY MOUTH TWICE A DAY WITH A MEAL  . cetirizine (ZYRTEC) 10 MG tablet Take 1 tablet (10 mg total) by mouth daily.  . Cholecalciferol (VITAMIN D3) 5000 UNITS CAPS Take one tablet by mouth daily  . clopidogrel (PLAVIX) 75 MG tablet TAKE 1 TABLET (75 MG TOTAL) BY MOUTH DAILY.  . clotrimazole-betamethasone (LOTRISONE) cream APPLY AS DIRECTED (Patient taking differently: Apply 1 application topically 2 (two) times daily as needed. APPLY AS DIRECTED)  . Coenzyme Q10 (COQ10) 100 MG CAPS Take as directed (Patient taking differently: Take 1 capsule by mouth daily. Take as directed)  . cyanocobalamin 1000 MCG tablet Take 1,000 mcg by mouth daily.  . furosemide (LASIX) 40 MG tablet TAKE 1 TABLET BY MOUTH EVERY DAY AS NEEDED  . montelukast (SINGULAIR) 10 MG tablet Take 1 tablet (10 mg total) by mouth at bedtime.  Marland Kitchen VIAGRA 100 MG tablet TAKE 1 TABLET BY MOUTH AS NEEDED FOR ERECTILE DYSFUNCTION.  . [DISCONTINUED] buPROPion (WELLBUTRIN SR) 150 MG 12 hr tablet TAKE 1 TABLET BY MOUTH TWICE A DAY  . [DISCONTINUED] nitroGLYCERIN (NITROSTAT) 0.4 MG SL tablet PLACE 1 TABLET (0.4 MG TOTAL) UNDER THE TONGUE EVERY 5 (FIVE) MINUTES AS NEEDED. FOR UP TO 3 DOSES.  . [DISCONTINUED]  pantoprazole (PROTONIX) 40 MG tablet TAKE 1 TABLET BY MOUTH EVERY DAY 30 MINUTES BEFORE DINNERTIME  . [DISCONTINUED] methylPREDNISolone (MEDROL) 4 MG tablet Take as directed (Patient not taking: Reported on 12/27/2016)   No facility-administered encounter medications on file as of 12/27/2016.     Allergies  Allergen Reactions  . Ace Inhibitors Swelling  . Angiotensin Receptor Blockers Swelling  . Aspirin Hives  . Codeine Nausea And Vomiting  . Irbesartan Other (See Comments)    REACTION: allergic to ARBs w/ angioedema  . Ramipril Other (See Comments)    REACTION: Allergic to ACE's w/ angioedema...    Immunization History  Administered Date(s) Administered  . Influenza Split 02/02/2011, 03/03/2012  . Influenza Whole 02/02/2010  . Influenza, High Dose Seasonal PF 02/20/2013, 01/16/2015, 12/16/2015, 12/22/2016  . Influenza, Seasonal, Injecte, Preservative Fre  02/21/2014  . Pneumococcal Conjugate-13 03/05/2014  . Pneumococcal Polysaccharide-23 06/18/2010, 09/03/2015  . Tdap 11/14/2012  . Zoster 09/11/2012  . Zoster Recombinat (Shingrix) 12/22/2016    Current Medications, Allergies, Past Medical History, Past Surgical History, Family History, and Social History were reviewed in [employer] record.    Review of Systems       See HPI - all other systems neg except as noted...       The patient complains of dyspnea on exertion and muscle weakness.  The patient denies anorexia, fever, weight loss, weight gain, vision loss, decreased hearing, hoarseness, chest pain, syncope, peripheral edema, prolonged cough, headaches, hemoptysis, abdominal pain, melena, hematochezia, severe indigestion/heartburn, hematuria, incontinence, suspicious skin lesions, transient blindness, difficulty walking, depression, unusual weight change, abnormal bleeding, enlarged lymph nodes, and angioedema.     Objective:   Physical Exam    WD, sl overweight, 66 y/o WM in NAD... GENERAL:   Alert & oriented; pleasant & cooperative... HEENT:  [place]/AT, EOM-wnl, PERRLA, EACs-clear, TMs-wnl, NOSE-clear, THROAT-s/p UPPP surg... NECK:  Supple w/ fairROM; no JVD; normal carotid impulses w/o bruits; no thyromegaly or nodules palpated; no lymphadenopathy. CHEST:  Decr BS at bases otherw clear, no signs of consolidation... HEART:  Regular Rhythm; without murmurs/ rubs/ or gallops. ABDOMEN:  Soft & nontender; normal bowel sounds; no organomegaly or masses detected. EXT: without deformities, mild arthritic changes; no varicose veins/ venous insuffic/ or edema. Neuro:  intact w/o focal abn detected... DERM:  No lesions noted; no rash etc...  RADIOLOGY DATA:  Reviewed in the EPIC EMR & discussed w/ the patient...   LABORATORY DATA:  Reviewed in the EPIC EMR & discussed w/ the patient...     Assessment & Plan:    Hx OSA, s/p UPPP in 2004>  Prev noted recurrent snoring & using breathe right nasal strips; he didn't want repeat sleep study or further eval, just want Rx for the strips so he can pay for them w/ his flex acct...   Hx recurrent bronchitis, underlying COPD (mixed type), COPD exac>   11/15> He quit smoking in 2004 & c/o recurrent bronchitic exacerbations=> PFT shows mod obstructive dis & we tried DULERA but too $$$ therefore switch to SYMBICORT160-2spBid regularly w/ f/u PFTs later... 6/16> Xolair was denied by insurance despite his IgE level= 1400... 12/16> presented w/ refractory COPD exac; treated w/ ZPak, Medroltaper, Incruse, Mucinex, fluids, Hycodan => eventually improved...  3/17> he was improved and backed off on mult meds on his own accord... 4/17> recurrent AB symptoms prompted restart of Pred therapy, vigorous antireflux regimen, plus DUONEB Tid, Symbicort & Incruse 5/17> weaning Pred to 10-5 Qod schedule, continue other Rx, refer to DrKozlow for his input... 6/17> DrKozlow has made several adjustments & trying to get Xolair approved => pt couldn't afford Rx. 07/22/16>     Larry Stone appears to have had a viral pneumonia & his serology was pos for FluB; no bacterial etiology established;  This has flared his COPD/ reactive airways component requiring reinstitution of his NEBS Tid, Symbicort160-2spBid, Singulair, and MUCINEX 1200mg  bid w/ fluids;  We placed him back on a slow Medrol taper for the airway inflammation;  Asked to rest at home w/ fluids but NO SALT & we plan rov recheck in 2-3 weeks. 08/12/16>   Larry Stone is much improved s/p COPD exac likely viral flu mediated AB;  Off Ab/s & finished Medrol- he is asked to remain active, & take his NEB Tid (slowly wean), Symbicort Bid, Singulair, Mucinex, fluids;  As noted we gave him Levaquin, Medrol dosepak, Proair for his upcoming trip to [place]. 12/27/16>   Larry Stone is stable on his Symbicort & Singulair, he wants to decr the Symbicort to 2sp once daily- while I'd prefer he continue the Bid dosing, he is adamant about the reduction- reminded to bump back up to 2spBid + NEBS at first sign of any resp problem;  He had the 2018 Flu she by DrPlotnikov & the 1st shingRix shot (c/o low grade temp, musc aches & pains after this vax).   Medical issues per DrPlotnikov>  HBP, CAD s/pMI, VI/ edema, HL, IFG, GERD/ Divertics/ Polyps, GU/ Low-T, DJD/ LBP, Anxiety...    Patient's Medications  New Prescriptions   No medications on file  Previous Medications   ALBUTEROL (PROAIR HFA) 108 (90 BASE) MCG/ACT INHALER    Inhale 1-2 puffs into the lungs every 6 (six) hours as needed for wheezing or shortness of breath.   ALBUTEROL (PROVENTIL) (2.5 MG/3ML) 0.083% NEBULIZER SOLUTION    Use one vial in the nebulizer every 4-6 hours if needed for cough or wheeze   ALPRAZOLAM (XANAX) 0.5 MG TABLET    TAKE 1 TABLET BY MOUTH THREE TIMES A DAY AS NEEDED   AMLODIPINE (NORVASC) 5 MG TABLET    TAKE 1 TABLET BY MOUTH EVERY DAY   AMMONIUM LACTATE (LAC-HYDRIN) 12 % LOTION    Apply topically as directed.   ATORVASTATIN (LIPITOR) 40 MG TABLET    TAKE 1 TABLET BY MOUTH EVERY  DAY   BUDESONIDE-FORMOTEROL (SYMBICORT) 160-4.5 MCG/ACT INHALER    INHALE 2 PUFFS INTO THE LUNGS 2 (TWO) TIMES DAILY.   CARVEDILOL (COREG) 12.5 MG TABLET    TAKE 1 TABLET BY MOUTH TWICE A DAY WITH A MEAL   CETIRIZINE (ZYRTEC) 10 MG TABLET    Take 1 tablet (10 mg total) by mouth daily.   CHOLECALCIFEROL (VITAMIN D3) 5000 UNITS CAPS    Take one tablet by mouth daily   CLOPIDOGREL (PLAVIX) 75 MG TABLET    TAKE 1 TABLET (75 MG TOTAL) BY MOUTH DAILY.   CLOTRIMAZOLE-BETAMETHASONE (LOTRISONE) CREAM    APPLY AS DIRECTED   COENZYME Q10 (COQ10) 100 MG CAPS    Take as directed   CYANOCOBALAMIN 1000 MCG TABLET    Take 1,000 mcg by mouth daily.   FUROSEMIDE (LASIX) 40 MG TABLET    TAKE 1 TABLET BY MOUTH EVERY DAY AS NEEDED   MONTELUKAST (SINGULAIR) 10 MG TABLET    Take 1 tablet (10 mg total) by mouth at bedtime.   VIAGRA 100 MG TABLET    TAKE 1 TABLET BY MOUTH AS NEEDED FOR ERECTILE DYSFUNCTION.  Modified Medications   Modified Medication Previous Medication   BUPROPION (WELLBUTRIN SR) 150 MG 12 HR TABLET buPROPion (WELLBUTRIN SR) 150 MG 12 hr tablet      TAKE 1 TABLET BY MOUTH TWICE A DAY    TAKE 1 TABLET BY MOUTH TWICE A DAY   NITROGLYCERIN (NITROSTAT) 0.4 MG SL TABLET nitroGLYCERIN (NITROSTAT) 0.4 MG SL tablet      PLACE 1 TABLET UNDER THE TONGUE EVERY 5 MINUTES AS NEEDED. FOR UP TO 3 DOSES.    PLACE 1 TABLET (0.4 MG TOTAL) UNDER THE TONGUE EVERY 5 (FIVE) MINUTES AS NEEDED. FOR UP TO 3 DOSES.   PANTOPRAZOLE (PROTONIX) 40 MG TABLET pantoprazole (PROTONIX) 40 MG tablet      TAKE 1 TABLET BY MOUTH EVERY DAY 30 MINUTES BEFORE DINNERTIME    TAKE 1 TABLET BY MOUTH EVERY DAY  30 MINUTES BEFORE DINNERTIME  Discontinued Medications   METHYLPREDNISOLONE (MEDROL) 4 MG TABLET    Take as directed

## 2016-12-27 NOTE — Patient Instructions (Signed)
Today we updated your med list in our EPIC system...    Continue your current medications the same...  We discussed decreasing the SYMBICORT to 2 sprays once daily since you are doing so well...  If you note that you are using the rescue inhaler or the NEB med more often-- then go back on the Symbicort- 2sp twice daily...  Stay as active as possible...  Call for any questions...  Let's plan a follow up visit in 79mo, sooner if needed for breathing problems.Marland KitchenMarland Kitchen

## 2017-03-14 MED FILL — SHINGRIX 50 MCG SUS: 50 | 1 days supply | Qty: 1 | Fill #1

## 2017-04-18 ENCOUNTER — Telehealth: Payer: Self-pay | Admitting: Pulmonary Disease

## 2017-04-18 MED ORDER — AZITHROMYCIN 250 MG PO TABS: ORAL_TABLET | ORAL | 0 refills | Status: DC

## 2017-04-18 MED ORDER — PREDNISONE 10 MG PO TABS: ORAL_TABLET | ORAL | 0 refills | Status: DC

## 2017-04-18 NOTE — Telephone Encounter (Signed)
Offer prednisone 10 mg, # 20, 4 X 2 DAYS, 3 X 2 DAYS, 2 X 2 DAYS, 1 X 2 DAYS           Zpak  250 mg, # 6, 2 today then one daily

## 2017-04-18 NOTE — Telephone Encounter (Signed)
Called and spoke with pt.  Pt feels that he is developing bronchitis. Pt reports of prod cough with clear mucus, wheezing, nasal drainage clear in color x2d Denies fever, chills or sweats.  He is doing albuterol neb treatment q4h with mild improvement.  Pt is requesting prednisone and zpak. [facility] [place]   CY please advise, as SN is not available. Thanks.   Current Outpatient Medications on File Prior to Visit  Medication Sig Dispense Refill  . albuterol (PROAIR HFA) 108 (90 Base) MCG/ACT inhaler Inhale 1-2 puffs into the lungs every 6 (six) hours as needed for wheezing or shortness of breath. 1 Inhaler 11  . albuterol (PROVENTIL) (2.5 MG/3ML) 0.083% nebulizer solution Use one vial in the nebulizer every 4-6 hours if needed for cough or wheeze 225 mL 11  . ALPRAZolam (XANAX) 0.5 MG tablet TAKE 1 TABLET BY MOUTH THREE TIMES A DAY AS NEEDED 90 tablet 2  . amLODipine (NORVASC) 5 MG tablet TAKE 1 TABLET BY MOUTH EVERY DAY 90 tablet 2  . ammonium lactate (LAC-HYDRIN) 12 % lotion Apply topically as directed. 400 g 3  . atorvastatin (LIPITOR) 40 MG tablet TAKE 1 TABLET BY MOUTH EVERY DAY 90 tablet 3  . budesonide-formoterol (SYMBICORT) 160-4.5 MCG/ACT inhaler INHALE 2 PUFFS INTO THE LUNGS 2 (TWO) TIMES DAILY. 3 Inhaler 3  . buPROPion (WELLBUTRIN SR) 150 MG 12 hr tablet TAKE 1 TABLET BY MOUTH TWICE A DAY 180 tablet 3  . carvedilol (COREG) 12.5 MG tablet TAKE 1 TABLET BY MOUTH TWICE A DAY WITH A MEAL 180 tablet 3  . cetirizine (ZYRTEC) 10 MG tablet Take 1 tablet (10 mg total) by mouth daily. 30 tablet 11  . Cholecalciferol (VITAMIN D3) 5000 UNITS CAPS Take one tablet by mouth daily    . clopidogrel (PLAVIX) 75 MG tablet TAKE 1 TABLET (75 MG TOTAL) BY MOUTH DAILY. 90 tablet 3  . clotrimazole-betamethasone (LOTRISONE) cream APPLY AS DIRECTED (Patient taking differently: Apply 1 application topically 2 (two) times daily as needed. APPLY AS DIRECTED) 45 g 11  . Coenzyme Q10  (COQ10) 100 MG CAPS Take as directed (Patient taking differently: Take 1 capsule by mouth daily. Take as directed) 30 each 11  . cyanocobalamin 1000 MCG tablet Take 1,000 mcg by mouth daily.    . furosemide (LASIX) 40 MG tablet TAKE 1 TABLET BY MOUTH EVERY DAY AS NEEDED 90 tablet 3  . montelukast (SINGULAIR) 10 MG tablet Take 1 tablet (10 mg total) by mouth at bedtime. 90 tablet 4  . nitroGLYCERIN (NITROSTAT) 0.4 MG SL tablet PLACE 1 TABLET UNDER THE TONGUE EVERY 5 MINUTES AS NEEDED. FOR UP TO 3 DOSES. 25 tablet 5  . pantoprazole (PROTONIX) 40 MG tablet TAKE 1 TABLET BY MOUTH EVERY DAY 30 MINUTES BEFORE DINNERTIME 90 tablet 3  . VIAGRA 100 MG tablet TAKE 1 TABLET BY MOUTH AS NEEDED FOR ERECTILE DYSFUNCTION. 15 tablet 0   No current facility-administered medications on file prior to visit.     Allergies  Allergen Reactions  . Ace Inhibitors Swelling  . Angiotensin Receptor Blockers Swelling  . Aspirin Hives  . Codeine Nausea And Vomiting  . Irbesartan Other (See Comments)    REACTION: allergic to ARBs w/ angioedema  . Ramipril Other (See Comments)    REACTION: Allergic to ACE's w/ angioedema.Marland KitchenMarland Kitchen

## 2017-04-18 NOTE — Telephone Encounter (Signed)
Called and spoke with pt letting him know I was sending in two prescriptions to his preferred pharmacy. Pt expressed understanding. Nothing further needed.

## 2017-05-31 ENCOUNTER — Ambulatory Visit (INDEPENDENT_AMBULATORY_CARE_PROVIDER_SITE_OTHER): Payer: Medicare Other

## 2017-05-31 ENCOUNTER — Encounter (INDEPENDENT_AMBULATORY_CARE_PROVIDER_SITE_OTHER): Payer: Self-pay | Admitting: Orthopaedic Surgery

## 2017-05-31 ENCOUNTER — Ambulatory Visit (INDEPENDENT_AMBULATORY_CARE_PROVIDER_SITE_OTHER): Payer: Medicare Other | Admitting: Orthopaedic Surgery

## 2017-05-31 VITALS — Ht 70.0 in | Wt 208.0 lb

## 2017-05-31 DIAGNOSIS — M25561 Pain in right knee: Secondary | ICD-10-CM

## 2017-05-31 DIAGNOSIS — M545 Low back pain: Secondary | ICD-10-CM | POA: Diagnosis not present

## 2017-05-31 NOTE — Progress Notes (Signed)
Office Visit Note   Patient: Larry Stone           Date of Birth: 09/05/1950           MRN: [id number] Visit Date: 05/31/2017              Requested by: Cassandria Anger, MD [address] [place], [place] [address] PCP: Cassandria Anger, MD   Assessment & Plan: Visit Diagnoses:  1. Acute bilateral low back pain, with sciatica presence unspecified   2. Acute pain of right knee     Plan: Impression is right knee medial meniscus tear.  I have proposed cortisone injection or MRI to assess the medial meniscus.  The patient would like to avoid both of these for right now.  If his pain does not improve or if it worsens he will call us and let us know and we will proceed with 1 of the above options.  Otherwise follow-up with [place] on as-needed basis  Follow-Up Instructions: Return if symptoms worsen or fail to improve.   Orders:  Orders Placed This Encounter  Procedures  . XR KNEE 3 VIEW RIGHT  . XR Lumbar Spine 2-3 Views   No orders of the defined types were placed in this encounter.     Procedures: No procedures performed   Clinical Data: No additional findings.   Subjective: Chief Complaint  Patient presents with  . Right Knee - Pain  . Lower Back - Pain    HPI Larry Stone is a pleasant 67 year old new patient who presents to our clinic today with right knee pain.  This began approximately 2 weeks ago when he tripped going up a set of stairs.  He thinks he may have twisted his leg at that time.  Since then he has had marked pain to the medial aspect.  He describes as a sharp shooting worse with flexion and pivoting of the knee.  He has tried Tylenol with moderate relief of symptoms.  No numbness tingling burning.  No previous cortisone injection or surgical intervention.  He also mentions at the onset of his knee pain that he had pain to the right groin area.  This is since resolved.  Review of Systems as detailed in HPI.  All others reviewed and are  negative.   Objective: Vital Signs: Ht 5\' 10"  (1.778 m)   Wt 208 lb (94.3 kg)   BMI 29.84 kg/m   Physical Exam well-developed well-nourished gentleman in no acute distress.  Alert and oriented x3.  Ortho Exam examination of his right knee reveals a trace effusion.  Range of motion 0-110 degrees.  Moderate tenderness medial joint line with a positive medial McMurray.  Minimal patellofemoral crepitus.  Ligaments are stable.  He is neurovascular intact distally.  Specialty Comments:  No specialty comments available.  Imaging: Xr Knee 3 View Right  Result Date: 05/31/2017 X-rays of the right knee reveal minimal to moderate degenerative changes throughout.  Xr Lumbar Spine 2-3 Views  Result Date: 05/31/2017 X-rays of the lumbar spine reveal marked degenerative disc disease at L3-4 and L4-5.    PMFS History: Patient Active Problem List   Diagnosis Date Noted  . Viral pneumonitis 07/22/2016  . COPD with asthma ([place]) 07/22/2016  . Depression with anxiety 07/13/2016  . Acute respiratory failure with hypoxia ([place]) 07/13/2016  . Chronic diastolic CHF (congestive heart failure) ([place]) 07/13/2016  . Sepsis, unspecified organism ([place]) 07/13/2016  . Acute pain of right knee 06/30/2016  . Fatigue  06/30/2016  . Creatinine elevation 06/30/2016  . Dyspnea on exertion   . COPD with acute exacerbation ([place]) 04/04/2015  . Elevated IgE level 03/18/2014  . Well adult exam 03/05/2014  . COPD with chronic bronchitis ([place]) 12/13/2013  . Venous insufficiency 11/03/2011  . Edema 11/03/2011  . Memory disturbance 05/13/2011  . CAD (coronary artery disease) 09/17/2010  . 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 Incidental pulmonary nodule 06/17/2009  . COLONIC POLYPS 10/17/2007  . BRONCHITIS, RECURRENT 10/17/2007  . GERD (gastroesophageal reflux disease) 10/17/2007  . Diverticulosis of large intestine 10/17/2007  . DEGENERATIVE JOINT DISEASE 10/17/2007  . Hyperglycemia 10/17/2007  . Dyslipidemia  10/02/2007  . Anxiety state 10/02/2007  . Obstructive sleep apnea 10/02/2007  . Essential hypertension 10/02/2007  . LOW BACK PAIN SYNDROME 10/02/2007   Past Medical History:  Diagnosis Date  . Anxiety   . CAD    a. acute inferior lateral wall infarction in September 2004 treated medically. b.  cath 06/17/16 showing mild nonobstructive CAD with 30-40% ostial LM (eccentric), elevated LV filling pressures and normal LV function.  . Chronic diastolic CHF (congestive heart failure) ([place])   . COPD (chronic obstructive pulmonary disease) ([place])   . Diabetes mellitus   . Diverticulosis   . DJD (degenerative joint disease)   . GERD (gastroesophageal reflux disease)   . HTN (hypertension)   . Hyperlipidemia   . Low back pain syndrome   . Myocardial infarction ([place]) 2004  . Obesity   . OSA (obstructive sleep apnea)   . Recurrent aspiration bronchitis/pneumonia ([place])   . Recurrent aspiration pneumonia ([place])    [name] 07/13/2016  . Thrombocytopenia ([place])   . Tubular adenoma of colon 2014    Family History  Problem Relation Age of Onset  . Colon cancer Father   . Stroke Mother   . Heart disease Mother   . Hypertension Sister   . Diabetes Sister   . Colon cancer Paternal Uncle     Past Surgical History:  Procedure Laterality Date  . CARDIAC CATHETERIZATION    . LEFT HEART CATH AND CORONARY ANGIOGRAPHY N/A 06/17/2016   Procedure: Left Heart Cath and Coronary Angiography;  Surgeon: Burnell Blanks, MD;  Location: [facility];  Service: Cardiovascular;  Laterality: N/A;  . UVULOPALATOPHARYNGOPLASTY     surgery for OSA   Social History   Occupational History  . Occupation: Probation officer of [employer]  Tobacco Use  . Smoking status: Former Smoker    Packs/day: 1.00    Years: 20.00    Pack years: 20.00    Types: Cigarettes, Cigars    Last attempt to quit: 05/08/1998    Years since quitting: 19.0  . Smokeless tobacco: Never Used  . Tobacco comment: quit in 2005   Substance and Sexual Activity  . Alcohol use: Yes    Alcohol/week: 0.6 oz    Types: 1 Standard drinks or equivalent per week    Comment: social drinker  . Drug use: No  . Sexual activity: Yes

## 2017-06-12 ENCOUNTER — Other Ambulatory Visit: Payer: Self-pay | Admitting: Internal Medicine

## 2017-06-13 NOTE — Telephone Encounter (Signed)
Routing to dr plotnikov, please advise, thanks 

## 2017-07-12 ENCOUNTER — Encounter: Payer: Self-pay | Admitting: Internal Medicine

## 2017-07-12 ENCOUNTER — Other Ambulatory Visit (INDEPENDENT_AMBULATORY_CARE_PROVIDER_SITE_OTHER): Payer: Medicare Other

## 2017-07-12 ENCOUNTER — Ambulatory Visit (INDEPENDENT_AMBULATORY_CARE_PROVIDER_SITE_OTHER): Payer: Medicare Other | Admitting: Internal Medicine

## 2017-07-12 VITALS — BP 116/74 | HR 62 | Temp 98.4°F | Ht 70.0 in | Wt 217.0 lb

## 2017-07-12 DIAGNOSIS — F411 Generalized anxiety disorder: Secondary | ICD-10-CM

## 2017-07-12 DIAGNOSIS — G8929 Other chronic pain: Secondary | ICD-10-CM

## 2017-07-12 DIAGNOSIS — N320 Bladder-neck obstruction: Secondary | ICD-10-CM

## 2017-07-12 DIAGNOSIS — E785 Hyperlipidemia, unspecified: Secondary | ICD-10-CM

## 2017-07-12 DIAGNOSIS — I10 Essential (primary) hypertension: Secondary | ICD-10-CM | POA: Diagnosis not present

## 2017-07-12 DIAGNOSIS — I872 Venous insufficiency (chronic) (peripheral): Secondary | ICD-10-CM | POA: Diagnosis not present

## 2017-07-12 DIAGNOSIS — M5440 Lumbago with sciatica, unspecified side: Secondary | ICD-10-CM

## 2017-07-12 DIAGNOSIS — Z0000 Encounter for general adult medical examination without abnormal findings: Secondary | ICD-10-CM | POA: Diagnosis not present

## 2017-07-12 DIAGNOSIS — I2510 Atherosclerotic heart disease of native coronary artery without angina pectoris: Secondary | ICD-10-CM

## 2017-07-12 DIAGNOSIS — R600 Localized edema: Secondary | ICD-10-CM

## 2017-07-12 LAB — URINALYSIS
BILIRUBIN URINE: NEGATIVE
HGB URINE DIPSTICK: NEGATIVE
KETONES UR: NEGATIVE
LEUKOCYTES UA: NEGATIVE
Nitrite: NEGATIVE
Specific Gravity, Urine: 1.025 (ref 1.000–1.030)
Total Protein, Urine: NEGATIVE
UROBILINOGEN UA: 0.2 (ref 0.0–1.0)
Urine Glucose: NEGATIVE
pH: 6.5 (ref 5.0–8.0)

## 2017-07-12 LAB — CBC WITH DIFFERENTIAL/PLATELET
BASOS PCT: 0.6 % (ref 0.0–3.0)
Basophils Absolute: 0 10*3/uL (ref 0.0–0.1)
EOS ABS: 0.3 10*3/uL (ref 0.0–0.7)
Eosinophils Relative: 4.1 % (ref 0.0–5.0)
HCT: 42.6 % (ref 39.0–52.0)
Hemoglobin: 14.8 g/dL (ref 13.0–17.0)
LYMPHS ABS: 1.8 10*3/uL (ref 0.7–4.0)
Lymphocytes Relative: 21.1 % (ref 12.0–46.0)
MCHC: 34.7 g/dL (ref 30.0–36.0)
MCV: 93.9 fl (ref 78.0–100.0)
MONO ABS: 0.8 10*3/uL (ref 0.1–1.0)
Monocytes Relative: 9.9 % (ref 3.0–12.0)
NEUTROS PCT: 64.3 % (ref 43.0–77.0)
Neutro Abs: 5.4 10*3/uL (ref 1.4–7.7)
Platelets: 171 10*3/uL (ref 150.0–400.0)
RBC: 4.54 Mil/uL (ref 4.22–5.81)
RDW: 13.1 % (ref 11.5–15.5)
WBC: 8.4 10*3/uL (ref 4.0–10.5)

## 2017-07-12 LAB — LIPID PANEL
CHOLESTEROL: 136 mg/dL (ref 0–200)
HDL: 44.1 mg/dL (ref >39.00)
LDL CALC: 80 mg/dL (ref 0–99)
NONHDL: 91.72
Total CHOL/HDL Ratio: 3
Triglycerides: 57 mg/dL (ref 0.0–149.0)
VLDL: 11.4 mg/dL (ref 0.0–40.0)

## 2017-07-12 LAB — HEPATIC FUNCTION PANEL
ALBUMIN: 4.1 g/dL (ref 3.5–5.2)
ALK PHOS: 103 U/L (ref 39–117)
ALT: 25 U/L (ref 0–53)
AST: 20 U/L (ref 0–37)
Bilirubin, Direct: 0.2 mg/dL (ref 0.0–0.3)
TOTAL PROTEIN: 6.5 g/dL (ref 6.0–8.3)
Total Bilirubin: 1.1 mg/dL (ref 0.2–1.2)

## 2017-07-12 LAB — BASIC METABOLIC PANEL
BUN: 18 mg/dL (ref 6–23)
CO2: 32 meq/L (ref 19–32)
Calcium: 9.1 mg/dL (ref 8.4–10.5)
Chloride: 102 mEq/L (ref 96–112)
Creatinine, Ser: 1.14 mg/dL (ref 0.40–1.50)
GFR: 68.07 mL/min (ref >60.00)
Glucose, Bld: 135 mg/dL — ABNORMAL HIGH (ref 70–99)
Potassium: 5.2 mEq/L — ABNORMAL HIGH (ref 3.5–5.1)
SODIUM: 138 meq/L (ref 135–145)

## 2017-07-12 LAB — PSA: PSA: 0.48 ng/mL (ref 0.10–4.00)

## 2017-07-12 LAB — TSH: TSH: 1.95 u[IU]/mL (ref 0.35–4.50)

## 2017-07-12 MED ORDER — SILDENAFIL CITRATE 100 MG PO TABS: ORAL_TABLET | ORAL | 3 refills | Status: DC

## 2017-07-12 MED ORDER — AMLODIPINE BESYLATE 5 MG PO TABS
2.5000 mg | ORAL_TABLET | Freq: Every day | ORAL | 3 refills | Status: DC
Start: 2017-07-12 — End: 2017-08-26

## 2017-07-12 NOTE — Assessment & Plan Note (Signed)
Reduce or d/c Norvasc

## 2017-07-12 NOTE — Assessment & Plan Note (Signed)
Coreg, amlodipine, Lasix, Plavix

## 2017-07-12 NOTE — Assessment & Plan Note (Signed)
On Tylenol prn 

## 2017-07-12 NOTE — Assessment & Plan Note (Signed)
Here for medicare wellness/physical  Diet: heart healthy  Physical activity: not sedentary  Depression/mood screen: negative  Hearing: intact to whispered voice  Visual acuity: grossly normal w/glasses, performs annual eye exam  ADLs: capable  Fall risk: low to none  Home safety: good  Cognitive evaluation: intact to orientation, naming, recall and repetition  EOL planning: adv directives, full code/ I agree  I have personally reviewed and have noted  1. The patient's medical, surgical and social history  2. Their use of alcohol, tobacco or illicit drugs  3. Their current medications and supplements  4. The patient's functional ability including ADL's, fall risks, home safety risks and hearing or visual impairment.  5. Diet and physical activities  6. Evidence for depression or mood disorders 7. The roster of all physicians providing medical care to patient - is listed in the Snapshot section of the chart and reviewed today.    Today patient counseled on age appropriate routine health concerns for screening and prevention, each reviewed and up to date or declined. Immunizations reviewed and up to date or declined. Labs ordered and reviewed. Risk factors for depression reviewed and negative. Hearing function and visual acuity are intact. ADLs screened and addressed as needed. Functional ability and level of safety reviewed and appropriate. Education, counseling and referrals performed based on assessed risks today. Patient provided with a copy of personalized plan for preventive services.  Colon q 5 years

## 2017-07-12 NOTE — Patient Instructions (Addendum)
reduce or stop amlodipine to 1/2 tablet a day due to leg swelling  Low dose Xarelto for a coronary disease

## 2017-07-12 NOTE — Progress Notes (Signed)
Subjective:  Patient ID: Larry Stone, male    DOB: 28-Sep-1950  Age: 67 y.o. MRN: [id number]  CC: No chief complaint on file.   HPI Larry Stone presents for a well exam C/o LE swelling R>>L - worse over past 6 mo  F/u CAD, HTN  Outpatient Medications Prior to Visit  Medication Sig Dispense Refill  . albuterol (PROAIR HFA) 108 (90 Base) MCG/ACT inhaler Inhale 1-2 puffs into the lungs every 6 (six) hours as needed for wheezing or shortness of breath. 1 Inhaler 11  . albuterol (PROVENTIL) (2.5 MG/3ML) 0.083% nebulizer solution Use one vial in the nebulizer every 4-6 hours if needed for cough or wheeze 225 mL 11  . ALPRAZolam (XANAX) 0.5 MG tablet TAKE 1 TABLET BY MOUTH THREE TIMES A DAY AS NEEDED 90 tablet 2  . amLODipine (NORVASC) 5 MG tablet TAKE 1 TABLET BY MOUTH EVERY DAY 90 tablet 2  . ammonium lactate (LAC-HYDRIN) 12 % lotion Apply topically as directed. 400 g 3  . atorvastatin (LIPITOR) 40 MG tablet TAKE 1 TABLET BY MOUTH EVERY DAY 90 tablet 3  . budesonide-formoterol (SYMBICORT) 160-4.5 MCG/ACT inhaler INHALE 2 PUFFS INTO THE LUNGS 2 (TWO) TIMES DAILY. 3 Inhaler 3  . buPROPion (WELLBUTRIN SR) 150 MG 12 hr tablet TAKE 1 TABLET BY MOUTH TWICE A DAY 180 tablet 3  . carvedilol (COREG) 12.5 MG tablet TAKE 1 TABLET BY MOUTH TWICE A DAY WITH A MEAL 180 tablet 3  . cetirizine (ZYRTEC) 10 MG tablet Take 1 tablet (10 mg total) by mouth daily. 30 tablet 11  . Cholecalciferol (VITAMIN D3) 5000 UNITS CAPS Take one tablet by mouth daily    . clopidogrel (PLAVIX) 75 MG tablet TAKE 1 TABLET (75 MG TOTAL) BY MOUTH DAILY. 90 tablet 3  . clotrimazole-betamethasone (LOTRISONE) cream APPLY AS DIRECTED (Patient taking differently: Apply 1 application topically 2 (two) times daily as needed. APPLY AS DIRECTED) 45 g 11  . Coenzyme Q10 (COQ10) 100 MG CAPS Take as directed (Patient taking differently: Take 1 capsule by mouth daily. Take as directed) 30 each 11  . cyanocobalamin 1000 MCG tablet Take  1,000 mcg by mouth daily.    . furosemide (LASIX) 40 MG tablet TAKE 1 TABLET BY MOUTH EVERY DAY AS NEEDED 90 tablet 3  . montelukast (SINGULAIR) 10 MG tablet Take 1 tablet (10 mg total) by mouth at bedtime. 90 tablet 4  . nitroGLYCERIN (NITROSTAT) 0.4 MG SL tablet PLACE 1 TABLET UNDER THE TONGUE EVERY 5 MINUTES AS NEEDED. FOR UP TO 3 DOSES. 25 tablet 5  . pantoprazole (PROTONIX) 40 MG tablet TAKE 1 TABLET BY MOUTH EVERY DAY 30 MINUTES BEFORE DINNERTIME 90 tablet 3  . VIAGRA 100 MG tablet TAKE 1 TABLET BY MOUTH AS NEEDED FOR ERECTILE DYSFUNCTION. 15 tablet 0  . azithromycin (ZITHROMAX) 250 MG tablet Take 2 tabs 12/31 and then one tab daily until done. (Patient not taking: Reported on 05/31/2017) 6 each 0  . predniSONE (DELTASONE) 10 MG tablet Take 4x2days, 3x2days, 2x2days, 1x2days, then stop 20 tablet 0   No facility-administered medications prior to visit.     ROS Review of Systems  Constitutional: Negative for appetite change, fatigue and unexpected weight change.  HENT: Negative for congestion, nosebleeds, sneezing, sore throat and trouble swallowing.   Eyes: Negative for itching and visual disturbance.  Respiratory: Negative for cough.   Cardiovascular: Positive for leg swelling. Negative for chest pain and palpitations.  Gastrointestinal: Negative for abdominal distention, blood in stool,  diarrhea and nausea.  Genitourinary: Negative for frequency and hematuria.  Musculoskeletal: Positive for arthralgias and back pain. Negative for gait problem, joint swelling and neck pain.  Skin: Negative for rash.  Neurological: Negative for dizziness, tremors, speech difficulty and weakness.  Psychiatric/Behavioral: Negative for agitation, dysphoric mood and sleep disturbance. The patient is not nervous/anxious.     Objective:  BP 116/74 (BP Location: Left Arm, Patient Position: Sitting, Cuff Size: Large)   Pulse 62   Temp 98.4 F (36.9 C) (Oral)   Ht 5\' 10"  (1.778 m)   Wt 217 lb (98.4 kg)    SpO2 98%   BMI 31.14 kg/m   BP Readings from Last 3 Encounters:  07/12/17 116/74  12/27/16 124/82  12/22/16 114/72    Wt Readings from Last 3 Encounters:  07/12/17 217 lb (98.4 kg)  05/31/17 208 lb (94.3 kg)  12/27/16 217 lb 4 oz (98.5 kg)    Physical Exam  Constitutional: He is oriented to person, place, and time. He appears well-developed. No distress.  NAD  HENT:  Mouth/Throat: Oropharynx is clear and moist.  Eyes: Pupils are equal, round, and reactive to light. Conjunctivae are normal.  Neck: Normal range of motion. No JVD present. No thyromegaly present.  Cardiovascular: Normal rate, regular rhythm, normal heart sounds and intact distal pulses. Exam reveals no gallop and no friction rub.  No murmur heard. Pulmonary/Chest: Effort normal and breath sounds normal. No respiratory distress. He has no wheezes. He has no rales. He exhibits no tenderness.  Abdominal: Soft. Bowel sounds are normal. He exhibits no distension and no mass. There is no tenderness. There is no rebound and no guarding.  Genitourinary: Rectal exam shows guaiac negative stool.  Musculoskeletal: Normal range of motion. He exhibits edema. He exhibits no tenderness.  Lymphadenopathy:    He has no cervical adenopathy.  Neurological: He is alert and oriented to person, place, and time. He has normal reflexes. No cranial nerve deficit. He exhibits normal muscle tone. He displays a negative Romberg sign. Coordination and gait normal.  Skin: Skin is warm and dry. No rash noted.  Psychiatric: He has a normal mood and affect. His behavior is normal. Judgment and thought content normal.  prostate 1+ RLE w/1+ and LLE is w/trace edema Obese   Lab Results  Component Value Date   WBC 7.9 08/20/2016   HGB 13.6 08/20/2016   HCT 39.5 08/20/2016   PLT 145.0 (L) 08/20/2016   GLUCOSE 101 (H) 08/20/2016   CHOL 128 03/30/2016   TRIG 59.0 03/30/2016   HDL 44.30 03/30/2016   LDLCALC 72 03/30/2016   ALT 38 07/22/2016    AST 18 07/22/2016   NA 140 08/20/2016   K 4.6 08/20/2016   CL 106 08/20/2016   CREATININE 1.07 08/20/2016   BUN 15 08/20/2016   CO2 30 08/20/2016   TSH 1.78 03/30/2016   PSA 0.43 03/30/2016   INR 1.0 06/09/2016   HGBA1C 5.7 10/15/2014    Dg Chest 2 View  Result Date: 08/20/2016 CLINICAL DATA:  Recent pneumonia EXAM: CHEST  2 VIEW COMPARISON:  July 22, 2016 FINDINGS: There is currently no edema or consolidation. Heart size and pulmonary vascularity are normal. No adenopathy. There is mild degenerative change in the thoracic spine. There old healed rib fractures on the right. IMPRESSION: Currently no edema or consolidation.  Stable cardiac silhouette. Electronically Signed   By: Lowella Grip III M.D.   On: 08/20/2016 09:56    Assessment & Plan:   There  are no diagnoses linked to this encounter. I have discontinued Estella Husk. [facility]man's predniSONE and azithromycin. I am also having him maintain his cetirizine, CoQ10, Vitamin D3, ammonium lactate, clotrimazole-betamethasone, budesonide-formoterol, cyanocobalamin, clopidogrel, furosemide, VIAGRA, albuterol, atorvastatin, carvedilol, amLODipine, albuterol, montelukast, pantoprazole, nitroGLYCERIN, buPROPion, and ALPRAZolam.  No orders of the defined types were placed in this encounter.    Follow-up: No follow-ups on file.  [name], MD

## 2017-07-12 NOTE — Assessment & Plan Note (Signed)
Lipitor 

## 2017-07-20 ENCOUNTER — Encounter: Payer: Self-pay | Admitting: Gastroenterology

## 2017-08-10 ENCOUNTER — Other Ambulatory Visit: Payer: Self-pay | Admitting: Cardiovascular Disease

## 2017-08-25 ENCOUNTER — Encounter: Payer: Self-pay | Admitting: Physician Assistant

## 2017-08-26 ENCOUNTER — Ambulatory Visit: Payer: Medicare Other | Admitting: Cardiovascular Disease

## 2017-08-26 ENCOUNTER — Encounter: Payer: Self-pay | Admitting: Cardiovascular Disease

## 2017-08-26 VITALS — BP 128/68 | HR 57 | Ht 70.0 in | Wt 211.4 lb

## 2017-08-26 DIAGNOSIS — E7800 Pure hypercholesterolemia, unspecified: Secondary | ICD-10-CM

## 2017-08-26 DIAGNOSIS — I10 Essential (primary) hypertension: Secondary | ICD-10-CM

## 2017-08-26 DIAGNOSIS — I5032 Chronic diastolic (congestive) heart failure: Secondary | ICD-10-CM | POA: Diagnosis not present

## 2017-08-26 DIAGNOSIS — I2510 Atherosclerotic heart disease of native coronary artery without angina pectoris: Secondary | ICD-10-CM | POA: Diagnosis not present

## 2017-08-26 MED ORDER — TRIAMTERENE-HCTZ 37.5-25 MG PO TABS: 1.0000 | ORAL_TABLET | Freq: Every day | ORAL | 11 refills | Status: DC

## 2017-08-26 NOTE — Patient Instructions (Addendum)
Medication Instructions:  Your physician has recommended you make the following change in your medication:  Stop Norvasc Start triamterene-HCTZ 37.5/25 mg by mouth daily   Labwork: none  Testing/Procedures: none  Follow-Up: Your physician recommends that you schedule a follow-up appointment in: 12 months.  Please call our office in about 8 months to schedule this appointment    Any Other Special Instructions Will Be Listed Below (If Applicable).     If you need a refill on your cardiac medications before your next appointment, please call your pharmacy.

## 2017-08-26 NOTE — Progress Notes (Signed)
Chief Complaint  Patient presents with  . Follow-up    CAD     History of Present Illness: 67 yo male with history of CAD, HLD, HTN, OSA and chronic diastolic CHF here today for cardiac follow up. 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 Stress myoview in June 2015 with inferolateral scar and no ischemia. When I saw him in February 2018 he was c/o dyspnea and fatigue. Cardiac cath on 06/17/16 with 40% left main stenosis, mild disease in the diagonal and proximal circumflex.   He is here today for follow up. The patient denies any chest pain, dyspnea, palpitations, lower extremity edema, orthopnea, PND, dizziness, near syncope or syncope. He has continued LE edema, improved some with reduction of Norvasc. His BP has been up though in the 140 and 150s on lower dose of Norvasc.   Primary Care Physician: Cassandria Anger, MD   Past Medical History:  Diagnosis Date  . Anxiety   . CAD    a. acute inferior lateral wall infarction in September 2004 treated medically. b.  cath 06/17/16 showing mild nonobstructive CAD with 30-40% ostial LM (eccentric), elevated LV filling pressures and normal LV function.  . Chronic diastolic CHF (congestive heart failure) ([place])   . COPD (chronic obstructive pulmonary disease) ([place])   . Diabetes mellitus   . Diverticulosis   . DJD (degenerative joint disease)   . GERD (gastroesophageal reflux disease)   . HTN (hypertension)   . Hyperlipidemia   . Low back pain syndrome   . Myocardial infarction ([place]) 2004  . Obesity   . OSA (obstructive sleep apnea)   . Recurrent aspiration bronchitis/pneumonia ([place])   . Recurrent aspiration pneumonia ([place])    [name] 07/13/2016  . Thrombocytopenia ([place])   . Tubular adenoma of colon 2014    Past Surgical History:  Procedure Laterality Date  . CARDIAC CATHETERIZATION    . LEFT HEART CATH AND CORONARY ANGIOGRAPHY N/A 06/17/2016   Procedure: Left Heart Cath and Coronary Angiography;  Surgeon: Burnell Blanks, MD;  Location: [facility];  Service: Cardiovascular;  Laterality: N/A;  . UVULOPALATOPHARYNGOPLASTY     surgery for OSA    Current Outpatient Medications  Medication Sig Dispense Refill  . albuterol (PROAIR HFA) 108 (90 Base) MCG/ACT inhaler Inhale 1-2 puffs into the lungs every 6 (six) hours as needed for wheezing or shortness of breath. 1 Inhaler 11  . albuterol (PROVENTIL) (2.5 MG/3ML) 0.083% nebulizer solution Use one vial in the nebulizer every 4-6 hours if needed for cough or wheeze 225 mL 11  . ALPRAZolam (XANAX) 0.5 MG tablet TAKE 1 TABLET BY MOUTH THREE TIMES A DAY AS NEEDED 90 tablet 2  . ammonium lactate (LAC-HYDRIN) 12 % lotion Apply topically as directed. 400 g 3  . atorvastatin (LIPITOR) 40 MG tablet TAKE 1 TABLET BY MOUTH EVERY DAY 90 tablet 3  . budesonide-formoterol (SYMBICORT) 160-4.5 MCG/ACT inhaler INHALE 2 PUFFS INTO THE LUNGS 2 (TWO) TIMES DAILY. 3 Inhaler 3  . buPROPion (WELLBUTRIN SR) 150 MG 12 hr tablet TAKE 1 TABLET BY MOUTH TWICE A DAY 180 tablet 3  . carvedilol (COREG) 12.5 MG tablet TAKE 1 TABLET BY MOUTH TWICE A DAY WITH A MEAL 180 tablet 3  . cetirizine (ZYRTEC) 10 MG tablet Take 1 tablet (10 mg total) by mouth daily. 30 tablet 11  . Cholecalciferol (VITAMIN D3) 5000 UNITS CAPS Take one tablet  by mouth daily    . clopidogrel (PLAVIX) 75 MG tablet TAKE 1 TABLET (75 MG TOTAL) BY MOUTH DAILY. 90 tablet 0  . clotrimazole-betamethasone (LOTRISONE) cream APPLY AS DIRECTED (Patient taking differently: Apply 1 application topically 2 (two) times daily as needed. APPLY AS DIRECTED) 45 g 11  . Coenzyme Q10 (COQ10) 100 MG CAPS Take as directed (Patient taking differently: Take 1 capsule by mouth daily. Take as directed) 30 each 11  . cyanocobalamin 1000 MCG tablet Take 1,000 mcg by mouth daily.    . furosemide (LASIX) 40 MG tablet TAKE 1 TABLET BY MOUTH EVERY DAY AS NEEDED 90 tablet  3  . montelukast (SINGULAIR) 10 MG tablet Take 1 tablet (10 mg total) by mouth at bedtime. 90 tablet 4  . nitroGLYCERIN (NITROSTAT) 0.4 MG SL tablet PLACE 1 TABLET UNDER THE TONGUE EVERY 5 MINUTES AS NEEDED. FOR UP TO 3 DOSES. 25 tablet 5  . pantoprazole (PROTONIX) 40 MG tablet TAKE 1 TABLET BY MOUTH EVERY DAY 30 MINUTES BEFORE DINNERTIME 90 tablet 3  . sildenafil (VIAGRA) 100 MG tablet TAKE 1 TABLET BY MOUTH AS NEEDED FOR ERECTILE DYSFUNCTION. 30 tablet 3  . triamterene-hydrochlorothiazide (MAXZIDE-25) 37.5-25 MG tablet Take 1 tablet by mouth daily. 30 tablet 11   No current facility-administered medications for this visit.     Allergies  Allergen Reactions  . Ace Inhibitors Swelling  . Angiotensin Receptor Blockers Swelling  . Aspirin Hives  . Codeine Nausea And Vomiting  . Irbesartan Other (See Comments)    REACTION: allergic to ARBs w/ angioedema  . Ramipril Other (See Comments)    REACTION: Allergic to ACE's w/ angioedema...    Social History   Socioeconomic History  . Marital status: Married    Spouse name: Not on file  . Number of children: 1  . Years of education: Not on file  . Highest education level: Not on file  Occupational History  . Occupation: Probation officer of [employer]  . Financial resource strain: Not on file  . Food insecurity:    Worry: Not on file    Inability: Not on file  . Transportation needs:    Medical: Not on file    Non-medical: Not on file  Tobacco Use  . Smoking status: Former Smoker    Packs/day: 1.00    Years: 20.00    Pack years: 20.00    Types: Cigarettes, Cigars    Last attempt to quit: 05/08/1998    Years since quitting: 19.3  . Smokeless tobacco: Never Used  . Tobacco comment: quit in 2005  Substance and Sexual Activity  . Alcohol use: Yes    Alcohol/week: 0.6 oz    Types: 1 Standard drinks or equivalent per week    Comment: social drinker  . Drug use: No  . Sexual activity: Yes  Lifestyle  .  Physical activity:    Days per week: Not on file    Minutes per session: Not on file  . Stress: Not on file  Relationships  . Social connections:    Talks on phone: Not on file    Gets together: Not on file    Attends religious service: Not on file    Active member of club or organization: Not on file    Attends meetings of clubs or organizations: Not on file    Relationship status: Not on file  . Intimate partner violence:    Fear of current or ex partner: Not on file  Emotionally abused: Not on file    Physically abused: Not on file    Forced sexual activity: Not on file  Other Topics Concern  . Not on file  Social History Narrative  . Not on file    Family History  Problem Relation Age of Onset  . Colon cancer Father   . Stroke Mother   . Heart disease Mother   . Hypertension Sister   . Diabetes Sister   . Colon cancer Paternal Uncle     Review of Systems:  As stated in the HPI and otherwise negative.   BP 128/68   Pulse (!) 57   Ht 5\' 10"  (1.778 m)   Wt 211 lb 6.4 oz (95.9 kg)   SpO2 95%   BMI 30.33 kg/m   Physical Examination:  General: Well developed, well nourished, NAD  HEENT: OP clear, mucus membranes moist  SKIN: warm, dry. No rashes. Neuro: No focal deficits  Musculoskeletal: Muscle strength 5/5 all ext  Psychiatric: Mood and affect normal  Neck: No JVD, no carotid bruits, no thyromegaly, no lymphadenopathy.  Lungs:Clear bilaterally, no wheezes, rhonci, crackles Cardiovascular: Regular rate and rhythm. No murmurs, gallops or rubs. Abdomen:Soft. Bowel sounds present. Non-tender.  Extremities: No lower extremity edema. Pulses are 2 + in the bilateral DP/PT.  Echo 08/30/13: Left ventricle: The cavity size was normal. Systolic function was normal. The estimated ejection fraction was in the range of 55% to 60%. Probable mild hypokinesis of the basal-midinferolateral and inferior myocardium. Doppler parameters are consistent with abnormal  left ventricular relaxation (grade 1 diastolic dysfunction). - Mitral valve: Calcified annulus.  Cardiac cath 06/17/16:  Ost Cx to Mid Cx lesion, 20 %stenosed.  Ost LM lesion, 40 %stenosed.  1st Diag lesion, 40 %stenosed.  The left ventricular systolic function is normal.  LV end diastolic pressure is normal.  The left ventricular ejection fraction is greater than 65% by visual estimate.  There is no mitral valve regurgitation.     EKG:  EKG is ordered today. The ekg ordered today demonstrates sinus brady, rate 57 bpm. RBBB.   Recent Labs: 07/12/2017: ALT 25; BUN 18; Creatinine, Ser 1.14; Hemoglobin 14.8; Platelets 171.0; Potassium 5.2; Sodium 138; TSH 1.95   Lipid Panel    Component Value Date/Time   CHOL 136 07/12/2017 1021   TRIG 57.0 07/12/2017 1021   HDL 44.10 07/12/2017 1021   CHOLHDL 3 07/12/2017 1021   VLDL 11.4 07/12/2017 1021   LDLCALC 80 07/12/2017 1021     Wt Readings from Last 3 Encounters:  08/26/17 211 lb 6.4 oz (95.9 kg)  07/12/17 217 lb (98.4 kg)  05/31/17 208 lb (94.3 kg)     Other studies Reviewed: Additional studies/ records that were reviewed today include: . Review of the above records demonstrates:    Assessment and Plan:  1. CAD without angina: No chest pain suggestive of angina. Cardiac cath March 2018 with mild to moderate non-obstructive CAD. Will continue statin, Plavix and beta blocker.     2. HYPERTENSION:  BP is well controlled here but has been up at home since lowering his dose of Norvasc due to LE edema. Will d/c Norvasc and will start Maxzide 37.5/25 once day.   3. HYPERLIPIDEMIA: Lipids controlled in primary care. Continue statin  4. Chronic diastolic CHF: Volume status ok. LE edema controlled with Lasix as needed.   Current medicines are reviewed at length with the patient today.  The patient does not have concerns regarding medicines.  The following changes  have been made:  no change  Labs/ tests ordered today include:    Orders Placed This Encounter  Procedures  . EKG 12-Lead    Disposition:   F/U with me 12 months  Signed, Lauree Chandler, MD 08/26/2017 10:28 AM    [facility] [address], [place], [place]  [address] Phone: [phone]; Fax: [phone]

## 2017-09-01 ENCOUNTER — Ambulatory Visit: Payer: Medicare Other | Admitting: Physician Assistant

## 2017-09-01 ENCOUNTER — Telehealth: Payer: Self-pay | Admitting: *Deleted

## 2017-09-01 ENCOUNTER — Encounter: Payer: Self-pay | Admitting: Physician Assistant

## 2017-09-01 VITALS — BP 110/76 | HR 70 | Ht 69.0 in | Wt 211.0 lb

## 2017-09-01 DIAGNOSIS — Z86010 Personal history of colonic polyps: Secondary | ICD-10-CM | POA: Diagnosis not present

## 2017-09-01 DIAGNOSIS — Z800 Family history of malignant neoplasm of digestive organs: Secondary | ICD-10-CM | POA: Diagnosis not present

## 2017-09-01 DIAGNOSIS — Z1211 Encounter for screening for malignant neoplasm of colon: Secondary | ICD-10-CM | POA: Diagnosis not present

## 2017-09-01 DIAGNOSIS — Z7901 Long term (current) use of anticoagulants: Secondary | ICD-10-CM | POA: Diagnosis not present

## 2017-09-01 MED ORDER — NA SULFATE-K SULFATE-MG SULF 17.5-3.13-1.6 GM/177ML PO SOLN: 1.0000 | Freq: Once | ORAL | 0 refills | Status: AC

## 2017-09-01 NOTE — Telephone Encounter (Signed)
[facility] Pre-operative Risk Assessment     Request for surgical clearance:     Endoscopy Procedure  What type of surgery is being performed?     Colonoscopy  When is this surgery scheduled?     10-04-2017  What type of clearance is required ?   Pharmacy  Are there any medications that need to be held prior to surgery and how long? Plavix- hold 5 days prior to the Colonoscopy  Practice name and name of physician performing surgery?      [facility]  What is your office phone and fax number?      Phone- [phone]  [phone]  Anesthesia type (None, local, MAC, general) ?       MAC

## 2017-09-01 NOTE — Progress Notes (Signed)
Subjective:    Patient ID: Larry Stone, male    DOB: [birth year], 67 y.o.   MRN: [id number]  HPI Larry Stone is a pleasant 67 year old white male, who comes in today to discuss recall colonoscopy.  He is known previously to Dr. Deatra Ina and last had colonoscopy in May 2014.  He does have family history of colon cancer in his father deceased at age 57. Last colonoscopy showed sigmoid diverticulosis and he had one 4 mm sessile polyp in the descending colon which was a tubular adenoma.  Prior colonoscopies, 2007 no polyps, June 2004 1 diminutive cecal adenoma and one left-sided hyperplastic polyp, and colonoscopy in 2001 with a few small polyps removed adenomas and hyperplastic. Patient is currently asymptomatic.  He denies any abdominal pain, changes in bowel habits melena or hematochezia.  He has no upper GI symptoms. He is maintained on chronic Plavix with history of coronary artery disease status post MI in 2004 which was managed medically.  He does not have any stents.  Last cardiac imaging showed a normal LV function with EF of 55 to 60%.  He has history of COPD, no oxygen use, hyperlipidemia, hypertension, sleep apnea and congestive heart failure.   Review of Systems;Pertinent positive and negative review of systems were noted in the above HPI section.  All other review of systems was otherwise negative.  Outpatient Encounter Medications as of 09/01/2017  Medication Sig  . albuterol (PROAIR HFA) 108 (90 Base) MCG/ACT inhaler Inhale 1-2 puffs into the lungs every 6 (six) hours as needed for wheezing or shortness of breath.  Marland Kitchen albuterol (PROVENTIL) (2.5 MG/3ML) 0.083% nebulizer solution Use one vial in the nebulizer every 4-6 hours if needed for cough or wheeze  . ALPRAZolam (XANAX) 0.5 MG tablet TAKE 1 TABLET BY MOUTH THREE TIMES A DAY AS NEEDED  . ammonium lactate (LAC-HYDRIN) 12 % lotion Apply topically as directed.  Marland Kitchen atorvastatin (LIPITOR) 40 MG tablet TAKE 1 TABLET BY MOUTH EVERY DAY  .  budesonide-formoterol (SYMBICORT) 160-4.5 MCG/ACT inhaler INHALE 2 PUFFS INTO THE LUNGS 2 (TWO) TIMES DAILY.  Marland Kitchen buPROPion (WELLBUTRIN SR) 150 MG 12 hr tablet TAKE 1 TABLET BY MOUTH TWICE A DAY  . carvedilol (COREG) 12.5 MG tablet TAKE 1 TABLET BY MOUTH TWICE A DAY WITH A MEAL  . cetirizine (ZYRTEC) 10 MG tablet Take 1 tablet (10 mg total) by mouth daily.  . Cholecalciferol (VITAMIN D3) 5000 UNITS CAPS Take one tablet by mouth daily  . clopidogrel (PLAVIX) 75 MG tablet TAKE 1 TABLET (75 MG TOTAL) BY MOUTH DAILY.  . clotrimazole-betamethasone (LOTRISONE) cream APPLY AS DIRECTED (Patient taking differently: Apply 1 application topically 2 (two) times daily as needed. APPLY AS DIRECTED)  . Coenzyme Q10 (COQ10) 100 MG CAPS Take as directed (Patient taking differently: Take 1 capsule by mouth daily. Take as directed)  . cyanocobalamin 1000 MCG tablet Take 1,000 mcg by mouth daily.  . furosemide (LASIX) 40 MG tablet TAKE 1 TABLET BY MOUTH EVERY DAY AS NEEDED  . montelukast (SINGULAIR) 10 MG tablet Take 1 tablet (10 mg total) by mouth at bedtime.  . nitroGLYCERIN (NITROSTAT) 0.4 MG SL tablet PLACE 1 TABLET UNDER THE TONGUE EVERY 5 MINUTES AS NEEDED. FOR UP TO 3 DOSES.  Marland Kitchen pantoprazole (PROTONIX) 40 MG tablet TAKE 1 TABLET BY MOUTH EVERY DAY 30 MINUTES BEFORE DINNERTIME  . sildenafil (VIAGRA) 100 MG tablet TAKE 1 TABLET BY MOUTH AS NEEDED FOR ERECTILE DYSFUNCTION.  Marland Kitchen triamterene-hydrochlorothiazide (MAXZIDE-25) 37.5-25 MG tablet Take 1 tablet  by mouth daily.  . Na Sulfate-K Sulfate-Mg Sulf 17.5-3.13-1.6 GM/177ML SOLN Take 1 kit by mouth once for 1 dose.   No facility-administered encounter medications on file as of 09/01/2017.    Allergies  Allergen Reactions  . Ace Inhibitors Swelling  . Angiotensin Receptor Blockers Swelling  . Aspirin Hives  . Codeine Nausea And Vomiting  . Irbesartan Other (See Comments)    REACTION: allergic to ARBs w/ angioedema  . Ramipril Other (See Comments)    REACTION:  Allergic to ACE's w/ angioedema...   Patient Active Problem List   Diagnosis Date Noted  . Viral pneumonitis 07/22/2016  . COPD with asthma ([place]) 07/22/2016  . Depression with anxiety 07/13/2016  . Acute respiratory failure with hypoxia ([place]) 07/13/2016  . Chronic diastolic CHF (congestive heart failure) ([place]) 07/13/2016  . Sepsis, unspecified organism ([employer]) 07/13/2016  . Acute pain of right knee 06/30/2016  . Fatigue 06/30/2016  . Creatinine elevation 06/30/2016  . Dyspnea on exertion   . COPD with acute exacerbation ([place]) 04/04/2015  . Elevated IgE level 03/18/2014  . Well adult exam 03/05/2014  . COPD with chronic bronchitis ([place]) 12/13/2013  . Venous insufficiency 11/03/2011  . Edema 11/03/2011  . Memory disturbance 05/13/2011  . CAD (coronary artery disease) 09/17/2010  . 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 Incidental pulmonary nodule 06/17/2009  . COLONIC POLYPS 10/17/2007  . BRONCHITIS, RECURRENT 10/17/2007  . GERD (gastroesophageal reflux disease) 10/17/2007  . Diverticulosis of large intestine 10/17/2007  . DEGENERATIVE JOINT DISEASE 10/17/2007  . Hyperglycemia 10/17/2007  . Dyslipidemia 10/02/2007  . Anxiety state 10/02/2007  . Obstructive sleep apnea 10/02/2007  . Essential hypertension 10/02/2007  . LOW BACK PAIN SYNDROME 10/02/2007   Social History   Socioeconomic History  . Marital status: Married    Spouse name: Not on file  . Number of children: 1  . Years of education: Not on file  . Highest education level: Not on file  Occupational History  . Occupation: Probation officer of [employer]  . Financial resource strain: Not on file  . Food insecurity:    Worry: Not on file    Inability: Not on file  . Transportation needs:    Medical: Not on file    Non-medical: Not on file  Tobacco Use  . Smoking status: Former Smoker    Packs/day: 1.00    Years: 20.00    Pack years: 20.00    Types: Cigarettes, Cigars    Last attempt to  quit: 05/08/1998    Years since quitting: 19.3  . Smokeless tobacco: Never Used  . Tobacco comment: quit in 2005  Substance and Sexual Activity  . Alcohol use: Yes    Alcohol/week: 0.6 oz    Types: 1 Standard drinks or equivalent per week    Comment: social drinker  . Drug use: No  . Sexual activity: Yes  Lifestyle  . Physical activity:    Days per week: Not on file    Minutes per session: Not on file  . Stress: Not on file  Relationships  . Social connections:    Talks on phone: Not on file    Gets together: Not on file    Attends religious service: Not on file    Active member of club or organization: Not on file    Attends meetings of clubs or organizations: Not on file    Relationship status: Not on file  . Intimate partner violence:    Fear of current or ex  partner: Not on file    Emotionally abused: Not on file    Physically abused: Not on file    Forced sexual activity: Not on file  Other Topics Concern  . Not on file  Social History Narrative  . Not on file    Larry Stone family history includes Colon cancer in his father and paternal uncle; Diabetes in his sister; Heart disease in his mother; Hypertension in his sister; Stroke in his mother.      Objective:    Vitals:   09/01/17 1103  BP: 110/76  Pulse: 70    Physical Exam; well-developed older white male in no acute distress, pleasant blood pressure 110/76, pulse 70, height 5 foot 9, weight 211, BMI 31.1.  HEENT; nontraumatic normocephalic EOMI PERRLA sclera anicteric, Oropharynx clear, Cardiovascular; regular rate and rhythm with S1-S2 no murmur rub or gallop, Pulmonary; clear bilaterally, Abdomen; soft, nontender nondistended bowel sounds are active there is no palpable mass or hepatosplenomegaly, Rectal ;exam not done, Extremities; no clubbing cyanosis or edema skin warm dry, Neuro psych; alert and oriented, grossly nonfocal mood and affect appropriate       Assessment & Plan:   #106 67 year old white  male with history of adenomatous colon polyps due for follow-up colonoscopy #2+ family history of colon cancer in patient's father deceased age 46 #3 chronic antiplatelet therapy-on Plavix #5 coronary artery disease status post MI, no stents #6 COPD-no oxygen use #7.  Obstructive sleep apnea #8.  Congestive heart failure/EF 55 to 60% #9Hypertension   #10 diverticulosis  Plan; Patient will be scheduled for colonoscopy with Dr. Loletha Carrow.  Procedure was discussed in detail with patient including indications risks and benefits and he is agreeable to proceed Patient had seen Dr. Havery Moros previously on one occasion in 2017 to discuss timing of follow-up colonoscopy.  Patient was very specific about times that he could do the colonoscopy, wanted to have this done on a Wednesday in June, and [place] of the [place] for the remainder of the summer.  We were able to accommodate him on Dr. Corena Pilgrim  his schedule. Patient will need to stop Plavix 5 days prior to colonoscopy.  We will communicate with his cardiologist/Dr. Angelena Form to assure that this is reasonable for this patient.   Amy S Esterwood PA-C 09/01/2017   Cc: Plotnikov, Evie Lacks, MD

## 2017-09-01 NOTE — Progress Notes (Signed)
Agree with assessment and plan. Thank you to Dr. Loletha Carrow for accommodating this patient

## 2017-09-01 NOTE — Patient Instructions (Addendum)
If you are age 68 or older, your body mass index should be between 23-30. Your Body mass index is 31.16 kg/m. If this is out of the aforementioned range listed, please consider follow up with your Primary Care Provider.  We will call you with the Plavix clearance instructions from Dr. Julianne Handler.  You have been scheduled for a colonoscopy. Please follow written instructions given to you at your visit today.  Please pick up your prep supplies at the pharmacy within the next 1-3 days. If you use inhalers (even only as needed), please bring them with you on the day of your procedure. Your physician has requested that you go to www.startemmi.com and enter the access code given to you at your visit today. This web site gives a general overview about your procedure. However, you should still follow specific instructions given to you by our office regarding your preparation for the procedure.

## 2017-09-01 NOTE — Telephone Encounter (Signed)
 [facility] Pre-operative Risk Assessment     Request for surgical clearance:     Endoscopy Procedure  What type of surgery is being performed?     Colonoscopy  When is this surgery scheduled?     10-04-2017  What type of clearance is required ?   Pharmacy  Are there any medications that need to be held prior to surgery and how long? Plavix- hold 5 days prior to the colonoscopy date.  Practice name and name of physician performing surgery?      [facility]  What is your office phone and fax number?      Phone- [phone]  [phone]  Anesthesia type (None, local, MAC, general) ?       MAC

## 2017-09-02 NOTE — Progress Notes (Signed)
Thank you for sending this case to me. I have reviewed the entire note, and the outlined plan seems appropriate.  I am happy to be available , given his schedule constraints.  Wilfrid Lund, MD

## 2017-09-05 NOTE — Telephone Encounter (Signed)
See note dated 09-05-2017.

## 2017-09-05 NOTE — Telephone Encounter (Signed)
Called the patient and advised him he can hold his Plavix on 6-13 through 6-18 . He can resume after the procedure until told otherwise.  The patient verbalized understanding the instructions.

## 2017-09-05 NOTE — Telephone Encounter (Signed)
OK to hold Plavix 5 days prior to his colonoscopy.  Lauree Chandler

## 2017-09-05 NOTE — Telephone Encounter (Signed)
Pam- forwarding to you-

## 2017-09-06 ENCOUNTER — Other Ambulatory Visit: Payer: Self-pay | Admitting: *Deleted

## 2017-09-20 ENCOUNTER — Other Ambulatory Visit: Payer: Self-pay | Admitting: *Deleted

## 2017-09-20 ENCOUNTER — Encounter: Payer: Self-pay | Admitting: Internal Medicine

## 2017-09-20 MED ORDER — TRIAMTERENE-HCTZ 37.5-25 MG PO TABS: 1.0000 | ORAL_TABLET | Freq: Every day | ORAL | 3 refills | Status: DC

## 2017-09-26 ENCOUNTER — Encounter: Payer: Self-pay | Admitting: Gastroenterology

## 2017-10-04 ENCOUNTER — Other Ambulatory Visit: Payer: Self-pay

## 2017-10-04 ENCOUNTER — Encounter: Payer: Self-pay | Admitting: Gastroenterology

## 2017-10-04 ENCOUNTER — Ambulatory Visit (AMBULATORY_SURGERY_CENTER): Payer: Medicare Other | Admitting: Gastroenterology

## 2017-10-04 VITALS — BP 145/62 | HR 55 | Temp 100.0°F | Resp 20 | Ht 69.0 in | Wt 211.0 lb

## 2017-10-04 DIAGNOSIS — Z1211 Encounter for screening for malignant neoplasm of colon: Secondary | ICD-10-CM | POA: Diagnosis not present

## 2017-10-04 DIAGNOSIS — Z800 Family history of malignant neoplasm of digestive organs: Secondary | ICD-10-CM | POA: Diagnosis not present

## 2017-10-04 DIAGNOSIS — D123 Benign neoplasm of transverse colon: Secondary | ICD-10-CM

## 2017-10-04 DIAGNOSIS — D122 Benign neoplasm of ascending colon: Secondary | ICD-10-CM

## 2017-10-04 DIAGNOSIS — D124 Benign neoplasm of descending colon: Secondary | ICD-10-CM | POA: Diagnosis not present

## 2017-10-04 MED ORDER — SODIUM CHLORIDE 0.9 % IV SOLN: 500.0000 mL | INTRAVENOUS | Status: DC

## 2017-10-04 NOTE — Patient Instructions (Signed)
Please read handouts on polyps and diverticulosis. Resume Plavix at prior dose today.     YOU HAD AN ENDOSCOPIC PROCEDURE TODAY AT [facility]:   Refer to the procedure report that was given to you for any specific questions about what was found during the examination.  If the procedure report does not answer your questions, please call your gastroenterologist to clarify.  If you requested that your care partner not be given the details of your procedure findings, then the procedure report has been included in a sealed envelope for you to review at your convenience later.  YOU SHOULD EXPECT: Some feelings of bloating in the abdomen. Passage of more gas than usual.  Walking can help get rid of the air that was put into your GI tract during the procedure and reduce the bloating. If you had a lower endoscopy (such as a colonoscopy or flexible sigmoidoscopy) you may notice spotting of blood in your stool or on the toilet paper. If you underwent a bowel prep for your procedure, you may not have a normal bowel movement for a few days.  Please Note:  You might notice some irritation and congestion in your nose or some drainage.  This is from the oxygen used during your procedure.  There is no need for concern and it should clear up in a day or so.  SYMPTOMS TO REPORT IMMEDIATELY:   Following lower endoscopy (colonoscopy or flexible sigmoidoscopy):  Excessive amounts of blood in the stool  Significant tenderness or worsening of abdominal pains  Swelling of the abdomen that is new, acute  Fever of 100F or higher    For urgent or emergent issues, a gastroenterologist can be reached at any hour by calling [phone].   DIET:  We do recommend a small meal at first, but then you may proceed to your regular diet.  Drink plenty of fluids but you should avoid alcoholic beverages for 24 hours.  ACTIVITY:  You should plan to take it easy for the rest of today and you should NOT DRIVE  or use heavy machinery until tomorrow (because of the sedation medicines used during the test).    FOLLOW UP: Our staff will call the number listed on your records the next business day following your procedure to check on you and address any questions or concerns that you may have regarding the information given to you following your procedure. If we do not reach you, we will leave a message.  However, if you are feeling well and you are not experiencing any problems, there is no need to return our call.  We will assume that you have returned to your regular daily activities without incident.  If any biopsies were taken you will be contacted by phone or by letter within the next 1-3 weeks.  Please call us at [phone] if you have not heard about the biopsies in 3 weeks.    SIGNATURES/CONFIDENTIALITY: You and/or your care partner have signed paperwork which will be entered into your electronic medical record.  These signatures attest to the fact that that the information above on your After Visit Summary has been reviewed and is understood.  Full responsibility of the confidentiality of this discharge information lies with you and/or your care-partner.

## 2017-10-04 NOTE — Op Note (Addendum)
[facility] Patient Name: Larry Stone Procedure Date: 10/04/2017 2:35 PM MRN: [id number] Endoscopist: Mallie Mussel L. Loletha Carrow , MD Age: 67 Referring MD:  Date of Birth: 1950-09-05 Gender: Male Account #: [id number] Procedure:                Colonoscopy Indications:              Screening in patient at increased risk: Colorectal                            cancer in father 58 or older (last colonoscopy                            08/2012) Medicines:                Monitored Anesthesia Care Procedure:                Pre-Anesthesia Assessment:                           - Prior to the procedure, a History and Physical                            was performed, and patient medications and                            allergies were reviewed. The patient's tolerance of                            previous anesthesia was also reviewed. The risks                            and benefits of the procedure and the sedation                            options and risks were discussed with the patient.                            All questions were answered, and informed consent                            was obtained. Prior Anticoagulants: The patient has                            taken Plavix (clopidogrel), last dose was 5 days                            prior to procedure. ASA Grade Assessment: III - A                            patient with severe systemic disease. After                            reviewing the risks and benefits, the patient was  deemed in satisfactory condition to undergo the                            procedure.                           After obtaining informed consent, the colonoscope                            was passed under direct vision. Throughout the                            procedure, the patient's blood pressure, pulse, and                            oxygen saturations were monitored continuously. The                            Colonoscope  was introduced through the anus and                            advanced to the the cecum, identified by                            appendiceal orifice and ileocecal valve. The                            colonoscopy was performed without difficulty. The                            patient tolerated the procedure well. The quality                            of the bowel preparation was excellent. The                            ileocecal valve, appendiceal orifice, and rectum                            were photographed. The quality of the bowel                            preparation was evaluated using the BBPS [facility]                            Bowel Preparation Scale) with scores of: Right                            Colon = 3, Transverse Colon = 3 and Left Colon = 3                            (entire mucosa seen well with no residual staining,  small fragments of stool or opaque liquid). The                            total BBPS score equals 9. Scope In: 2:41:30 PM Scope Out: 2:57:02 PM Scope Withdrawal Time: 0 hours 13 minutes 14 seconds  Total Procedure Duration: 0 hours 15 minutes 32 seconds  Findings:                 The perianal and digital rectal examinations were                            normal.                           Diverticula were found in the left colon.                           Three sessile polyps were found in the descending                            colon, transverse colon and ascending colon. The                            polyps were 2 to 4 mm in size. These polyps were                            removed with a cold snare. Resection and retrieval                            were complete.                           The exam was otherwise without abnormality on                            direct and retroflexion views. Complications:            No immediate complications. Estimated Blood Loss:     Estimated blood loss was  minimal. Impression:               - Diverticulosis in the left colon.                           - Three 2 to 4 mm polyps in the descending colon,                            in the transverse colon and in the ascending colon,                            removed with a cold snare. Resected and retrieved.                           - The examination was otherwise normal on direct  and retroflexion views. Recommendation:           - Patient has a contact number available for                            emergencies. The signs and symptoms of potential                            delayed complications were discussed with the                            patient. Return to normal activities tomorrow.                            Written discharge instructions were provided to the                            patient.                           - Resume previous diet.                           - Continue present medications.                           - Await pathology results.                           - Repeat colonoscopy is recommended for                            surveillance. The colonoscopy date will be                            determined after pathology results from today's                            exam become available for review.                           - Resume Plavix (clopidogrel) at prior dose today. Jaselynn Tamas L. Loletha Carrow, MD 10/04/2017 3:02:57 PM This report has been signed electronically.

## 2017-10-04 NOTE — Progress Notes (Signed)
Spontaneous respirations throughout. VSS. Resting comfortably. To PACU on room air. Report to  RN. 

## 2017-10-04 NOTE — Progress Notes (Signed)
Called to room to assist during endoscopic procedure.  Patient ID and intended procedure confirmed with present staff. Received instructions for my participation in the procedure from the performing physician.  

## 2017-10-04 NOTE — Progress Notes (Signed)
YOU HAD AN ENDOSCOPIC PROCEDURE TODAY AT [facility]:   Refer to the procedure report that was given to you for any specific questions about what was found during the examination.  If the procedure report does not answer your questions, please call your gastroenterologist to clarify.  If you requested that your care partner not be given the details of your procedure findings, then the procedure report has been included in a sealed envelope for you to review at your convenience later.  YOU SHOULD EXPECT: Some feelings of bloating in the abdomen. Passage of more gas than usual.  Walking can help get rid of the air that was put into your GI tract during the procedure and reduce the bloating. If you had a lower endoscopy (such as a colonoscopy or flexible sigmoidoscopy) you may notice spotting of blood in your stool or on the toilet paper. If you underwent a bowel prep for your procedure, you may not have a normal bowel movement for a few days.  Please Note:  You might notice some irritation and congestion in your nose or some drainage.  This is from the oxygen used during your procedure.  There is no need for concern and it should clear up in a day or so.  SYMPTOMS TO REPORT IMMEDIATELY:   Following lower endoscopy (colonoscopy or flexible sigmoidoscopy):  Excessive amounts of blood in the stool  Significant tenderness or worsening of abdominal pains  Swelling of the abdomen that is new, acute  Fever of 100F or higher   Following upper endoscopy (EGD)  Vomiting of blood or coffee ground material  New chest pain or pain under the shoulder blades  Painful or persistently difficult swallowing  New shortness of breath  Fever of 100F or higher  Black, tarry-looking stools  For urgent or emergent issues, a gastroenterologist can be reached at any hour by calling [phone].   DIET:  We do recommend a small meal at first, but then you may proceed to your regular diet.  Drink  plenty of fluids but you should avoid alcoholic beverages for 24 hours.  ACTIVITY:  You should plan to take it easy for the rest of today and you should NOT DRIVE or use heavy machinery until tomorrow (because of the sedation medicines used during the test).    FOLLOW UP: Our staff will call the number listed on your records the next business day following your procedure to check on you and address any questions or concerns that you may have regarding the information given to you following your procedure. If we do not reach you, we will leave a message.  However, if you are feeling well and you are not experiencing any problems, there is no need to return our call.  We will assume that you have returned to your regular daily activities without incident.Pt's states no medical or surgical changes since previsit or office visit.

## 2017-10-05 ENCOUNTER — Telehealth: Payer: Self-pay

## 2017-10-05 NOTE — Telephone Encounter (Signed)
  Follow up Call-  Call back number 10/04/2017  Post procedure Call Back phone  # [phone]  Permission to leave phone message Yes  Some recent data might be hidden     Patient questions:  Do you have a fever, pain , or abdominal swelling? No. Pain Score  0 *  Have you tolerated food without any problems? Yes.    Have you been able to return to your normal activities? Yes.    Do you have any questions about your discharge instructions: Diet   No. Medications  No. Follow up visit  No.  Do you have questions or concerns about your Care? No.  Actions: * If pain score is 4 or above: No action needed, pain <4.

## 2017-10-08 ENCOUNTER — Encounter: Payer: Self-pay | Admitting: Gastroenterology

## 2017-10-12 ENCOUNTER — Encounter: Payer: Self-pay | Admitting: Internal Medicine

## 2017-10-12 ENCOUNTER — Ambulatory Visit: Payer: Medicare Other | Admitting: Internal Medicine

## 2017-10-12 DIAGNOSIS — F09 Unspecified mental disorder due to known physiological condition: Secondary | ICD-10-CM | POA: Diagnosis not present

## 2017-10-12 DIAGNOSIS — R413 Other amnesia: Secondary | ICD-10-CM

## 2017-10-12 DIAGNOSIS — I5032 Chronic diastolic (congestive) heart failure: Secondary | ICD-10-CM

## 2017-10-12 DIAGNOSIS — I10 Essential (primary) hypertension: Secondary | ICD-10-CM | POA: Diagnosis not present

## 2017-10-12 MED ORDER — TRIAMTERENE-HCTZ 75-50 MG PO TABS: 1.0000 | ORAL_TABLET | Freq: Every day | ORAL | 3 refills | Status: DC

## 2017-10-12 NOTE — Assessment & Plan Note (Signed)
Coreg, amlodipine - d/c, Lasix prn Triamt - HCTZ - increase the dose

## 2017-10-12 NOTE — Assessment & Plan Note (Signed)
Use hearing aids RTC if worse

## 2017-10-12 NOTE — Progress Notes (Signed)
Subjective:  Patient ID: Larry Stone, male    DOB: 1951/01/11  Age: 67 y.o. MRN: [id number]  CC: No chief complaint on file.   HPI Larry Stone presents for leg swelling - resolved off Norvasc F/u HTN, CAD SBP 150-170 now C/o forgetfulness   Outpatient Medications Prior to Visit  Medication Sig Dispense Refill  . albuterol (PROAIR HFA) 108 (90 Base) MCG/ACT inhaler Inhale 1-2 puffs into the lungs every 6 (six) hours as needed for wheezing or shortness of breath. 1 Inhaler 11  . albuterol (PROVENTIL) (2.5 MG/3ML) 0.083% nebulizer solution Use one vial in the nebulizer every 4-6 hours if needed for cough or wheeze 225 mL 11  . ALPRAZolam (XANAX) 0.5 MG tablet TAKE 1 TABLET BY MOUTH THREE TIMES A DAY AS NEEDED 90 tablet 2  . ammonium lactate (LAC-HYDRIN) 12 % lotion Apply topically as directed. 400 g 3  . atorvastatin (LIPITOR) 40 MG tablet TAKE 1 TABLET BY MOUTH EVERY DAY 90 tablet 3  . budesonide-formoterol (SYMBICORT) 160-4.5 MCG/ACT inhaler INHALE 2 PUFFS INTO THE LUNGS 2 (TWO) TIMES DAILY. 3 Inhaler 3  . buPROPion (WELLBUTRIN SR) 150 MG 12 hr tablet TAKE 1 TABLET BY MOUTH TWICE A DAY 180 tablet 3  . carvedilol (COREG) 12.5 MG tablet TAKE 1 TABLET BY MOUTH TWICE A DAY WITH A MEAL 180 tablet 3  . cetirizine (ZYRTEC) 10 MG tablet Take 1 tablet (10 mg total) by mouth daily. 30 tablet 11  . Cholecalciferol (VITAMIN D3) 5000 UNITS CAPS Take one tablet by mouth daily    . clopidogrel (PLAVIX) 75 MG tablet TAKE 1 TABLET (75 MG TOTAL) BY MOUTH DAILY. 90 tablet 0  . clotrimazole-betamethasone (LOTRISONE) cream APPLY AS DIRECTED (Patient taking differently: Apply 1 application topically 2 (two) times daily as needed. APPLY AS DIRECTED) 45 g 11  . Coenzyme Q10 (COQ10) 100 MG CAPS Take as directed (Patient taking differently: Take 1 capsule by mouth daily. Take as directed) 30 each 11  . cyanocobalamin 1000 MCG tablet Take 1,000 mcg by mouth daily.    . furosemide (LASIX) 40 MG tablet TAKE 1  TABLET BY MOUTH EVERY DAY AS NEEDED 90 tablet 3  . montelukast (SINGULAIR) 10 MG tablet Take 1 tablet (10 mg total) by mouth at bedtime. 90 tablet 4  . nitroGLYCERIN (NITROSTAT) 0.4 MG SL tablet PLACE 1 TABLET UNDER THE TONGUE EVERY 5 MINUTES AS NEEDED. FOR UP TO 3 DOSES. 25 tablet 5  . sildenafil (VIAGRA) 100 MG tablet TAKE 1 TABLET BY MOUTH AS NEEDED FOR ERECTILE DYSFUNCTION. 30 tablet 3  . triamterene-hydrochlorothiazide (MAXZIDE-25) 37.5-25 MG tablet Take 1 tablet by mouth daily. 90 tablet 3   Facility-Administered Medications Prior to Visit  Medication Dose Route Frequency Provider Last Rate Last Dose  . 0.9 %  sodium chloride infusion  500 mL Intravenous Continuous Danis, Estill Cotta III, MD        ROS: Review of Systems  Constitutional: Negative for appetite change, fatigue and unexpected weight change.  HENT: Negative for congestion, nosebleeds, sneezing, sore throat and trouble swallowing.   Eyes: Negative for itching and visual disturbance.  Respiratory: Negative for cough.   Cardiovascular: Negative for chest pain, palpitations and leg swelling.  Gastrointestinal: Negative for abdominal distention, blood in stool, diarrhea and nausea.  Genitourinary: Negative for frequency and hematuria.  Musculoskeletal: Negative for back pain, gait problem, joint swelling and neck pain.  Skin: Negative for rash.  Neurological: Negative for dizziness, tremors, speech difficulty and weakness.  Psychiatric/Behavioral: Positive for decreased concentration. Negative for agitation, dysphoric mood, sleep disturbance and suicidal ideas. The patient is not nervous/anxious.     Objective:  BP (!) 146/78 (BP Location: Left Arm, Patient Position: Sitting, Cuff Size: Large)   Pulse 63   Temp 98.3 F (36.8 C) (Oral)   Ht 5\' 9"  (1.753 m)   Wt 211 lb (95.7 kg)   SpO2 98%   BMI 31.16 kg/m   BP Readings from Last 3 Encounters:  10/12/17 (!) 146/78  10/04/17 (!) 145/62  09/01/17 110/76    Wt Readings  from Last 3 Encounters:  10/12/17 211 lb (95.7 kg)  10/04/17 211 lb (95.7 kg)  09/01/17 211 lb (95.7 kg)    Physical Exam  Constitutional: He is oriented to person, place, and time. He appears well-developed. No distress.  NAD  HENT:  Mouth/Throat: Oropharynx is clear and moist.  Eyes: Pupils are equal, round, and reactive to light. Conjunctivae are normal.  Neck: Normal range of motion. No JVD present. No thyromegaly present.  Cardiovascular: Normal rate, regular rhythm, normal heart sounds and intact distal pulses. Exam reveals no gallop and no friction rub.  No murmur heard. Pulmonary/Chest: Effort normal and breath sounds normal. No respiratory distress. He has no wheezes. He has no rales. He exhibits no tenderness.  Abdominal: Soft. Bowel sounds are normal. He exhibits no distension and no mass. There is no tenderness. There is no rebound and no guarding.  Musculoskeletal: Normal range of motion. He exhibits no edema or tenderness.  Lymphadenopathy:    He has no cervical adenopathy.  Neurological: He is alert and oriented to person, place, and time. He has normal reflexes. No cranial nerve deficit. He exhibits normal muscle tone. He displays a negative Romberg sign. Coordination and gait normal.  Skin: Skin is warm and dry. No rash noted.  Psychiatric: He has a normal mood and affect. His behavior is normal. Judgment and thought content normal.  hard hearing Recalls 2/3 in 5 min Clock test nl A/o/c  Lab Results  Component Value Date   WBC 8.4 07/12/2017   HGB 14.8 07/12/2017   HCT 42.6 07/12/2017   PLT 171.0 07/12/2017   GLUCOSE 135 (H) 07/12/2017   CHOL 136 07/12/2017   TRIG 57.0 07/12/2017   HDL 44.10 07/12/2017   LDLCALC 80 07/12/2017   ALT 25 07/12/2017   AST 20 07/12/2017   NA 138 07/12/2017   K 5.2 (H) 07/12/2017   CL 102 07/12/2017   CREATININE 1.14 07/12/2017   BUN 18 07/12/2017   CO2 32 07/12/2017   TSH 1.95 07/12/2017   PSA 0.48 07/12/2017   INR 1.0  06/09/2016   HGBA1C 5.7 10/15/2014    Dg Chest 2 View  Result Date: 08/20/2016 CLINICAL DATA:  Recent pneumonia EXAM: CHEST  2 VIEW COMPARISON:  July 22, 2016 FINDINGS: There is currently no edema or consolidation. Heart size and pulmonary vascularity are normal. No adenopathy. There is mild degenerative change in the thoracic spine. There old healed rib fractures on the right. IMPRESSION: Currently no edema or consolidation.  Stable cardiac silhouette. Electronically Signed   By: Lowella Grip III M.D.   On: 08/20/2016 09:56    Assessment & Plan:   There are no diagnoses linked to this encounter.   No orders of the defined types were placed in this encounter.    Follow-up: No follow-ups on file.  [name], MD

## 2017-10-12 NOTE — Assessment & Plan Note (Signed)
Use hearing aids

## 2017-10-12 NOTE — Patient Instructions (Signed)
Vitamin B complex

## 2017-10-12 NOTE — Assessment & Plan Note (Signed)
Worse Coreg, amlodipine - d/c, Lasix prn Triamt - HCTZ - increase the dose

## 2017-10-29 ENCOUNTER — Other Ambulatory Visit: Payer: Self-pay | Admitting: Cardiovascular Disease

## 2017-10-29 ENCOUNTER — Other Ambulatory Visit: Payer: Self-pay | Admitting: Pulmonary Disease

## 2017-11-25 ENCOUNTER — Other Ambulatory Visit: Payer: Self-pay | Admitting: Pulmonary Disease

## 2017-12-07 ENCOUNTER — Other Ambulatory Visit: Payer: Self-pay | Admitting: Internal Medicine

## 2017-12-12 ENCOUNTER — Other Ambulatory Visit: Payer: Self-pay | Admitting: Pulmonary Disease

## 2018-01-13 ENCOUNTER — Other Ambulatory Visit: Payer: Self-pay | Admitting: Pulmonary Disease

## 2018-01-17 ENCOUNTER — Encounter: Payer: Self-pay | Admitting: Internal Medicine

## 2018-01-17 ENCOUNTER — Other Ambulatory Visit (INDEPENDENT_AMBULATORY_CARE_PROVIDER_SITE_OTHER): Payer: Medicare Other

## 2018-01-17 ENCOUNTER — Ambulatory Visit: Payer: Medicare Other | Admitting: Internal Medicine

## 2018-01-17 DIAGNOSIS — R739 Hyperglycemia, unspecified: Secondary | ICD-10-CM

## 2018-01-17 DIAGNOSIS — F411 Generalized anxiety disorder: Secondary | ICD-10-CM | POA: Diagnosis not present

## 2018-01-17 DIAGNOSIS — I10 Essential (primary) hypertension: Secondary | ICD-10-CM | POA: Diagnosis not present

## 2018-01-17 DIAGNOSIS — I2510 Atherosclerotic heart disease of native coronary artery without angina pectoris: Secondary | ICD-10-CM

## 2018-01-17 DIAGNOSIS — F418 Other specified anxiety disorders: Secondary | ICD-10-CM

## 2018-01-17 LAB — LIPID PANEL
CHOL/HDL RATIO: 5
CHOLESTEROL: 202 mg/dL — AB (ref 0–200)
HDL: 42.9 mg/dL (ref >39.00)
LDL CALC: 144 mg/dL — AB (ref 0–99)
NonHDL: 159.18
TRIGLYCERIDES: 75 mg/dL (ref 0.0–149.0)
VLDL: 15 mg/dL (ref 0.0–40.0)

## 2018-01-17 LAB — BASIC METABOLIC PANEL
BUN: 20 mg/dL (ref 6–23)
CHLORIDE: 103 meq/L (ref 96–112)
CO2: 31 meq/L (ref 19–32)
CREATININE: 1.2 mg/dL (ref 0.40–1.50)
Calcium: 9.2 mg/dL (ref 8.4–10.5)
GFR: 64.06 mL/min (ref >60.00)
Glucose, Bld: 106 mg/dL — ABNORMAL HIGH (ref 70–99)
Potassium: 4.4 mEq/L (ref 3.5–5.1)
Sodium: 140 mEq/L (ref 135–145)

## 2018-01-17 LAB — HEMOGLOBIN A1C: HEMOGLOBIN A1C: 6 % (ref 4.6–6.5)

## 2018-01-17 LAB — HEPATIC FUNCTION PANEL
ALT: 32 U/L (ref 0–53)
AST: 23 U/L (ref 0–37)
Albumin: 4.1 g/dL (ref 3.5–5.2)
Alkaline Phosphatase: 72 U/L (ref 39–117)
BILIRUBIN DIRECT: 0.1 mg/dL (ref 0.0–0.3)
BILIRUBIN TOTAL: 0.7 mg/dL (ref 0.2–1.2)
Total Protein: 6.8 g/dL (ref 6.0–8.3)

## 2018-01-17 MED ORDER — CARVEDILOL 25 MG PO TABS: 25.0000 mg | ORAL_TABLET | Freq: Two times a day (BID) | ORAL | 3 refills | Status: DC

## 2018-01-17 NOTE — Assessment & Plan Note (Signed)
Labs Wt loss 

## 2018-01-17 NOTE — Assessment & Plan Note (Signed)
No CP 

## 2018-01-17 NOTE — Progress Notes (Signed)
Subjective:  Patient ID: Larry Stone, male    DOB: [birth year]  Age: 67 y.o. MRN: [id number]  CC: No chief complaint on file.   HPI Larry Stone presents for a HTN, dyslipidemia, anxiety. Less arthralgia w/less statin No LE edema w/o amlodipine  Outpatient Medications Prior to Visit  Medication Sig Dispense Refill  . albuterol (PROAIR HFA) 108 (90 Base) MCG/ACT inhaler Inhale 1-2 puffs into the lungs every 6 (six) hours as needed for wheezing or shortness of breath. 1 Inhaler 11  . albuterol (PROVENTIL) (2.5 MG/3ML) 0.083% nebulizer solution Use one vial in the nebulizer every 4-6 hours if needed for cough or wheeze 225 mL 11  . ALPRAZolam (XANAX) 0.5 MG tablet TAKE 1 TABLET BY MOUTH THREE TIMES A DAY AS NEEDED 90 tablet 3  . ammonium lactate (LAC-HYDRIN) 12 % lotion Apply topically as directed. 400 g 3  . atorvastatin (LIPITOR) 40 MG tablet TAKE 1 TABLET BY MOUTH EVERY DAY 90 tablet 3  . budesonide-formoterol (SYMBICORT) 160-4.5 MCG/ACT inhaler INHALE 2 PUFFS INTO THE LUNGS 2 (TWO) TIMES DAILY. 3 Inhaler 3  . buPROPion (WELLBUTRIN SR) 150 MG 12 hr tablet TAKE 1 TABLET BY MOUTH TWICE A DAY 180 tablet 3  . carvedilol (COREG) 12.5 MG tablet TAKE 1 TABLET BY MOUTH TWICE A DAY WITH A MEAL 180 tablet 2  . cetirizine (ZYRTEC) 10 MG tablet Take 1 tablet (10 mg total) by mouth daily. 30 tablet 11  . Cholecalciferol (VITAMIN D3) 5000 UNITS CAPS Take one tablet by mouth daily    . clopidogrel (PLAVIX) 75 MG tablet TAKE 1 TABLET BY MOUTH EVERY DAY 90 tablet 2  . clotrimazole-betamethasone (LOTRISONE) cream APPLY AS DIRECTED (Patient taking differently: Apply 1 application topically 2 (two) times daily as needed. APPLY AS DIRECTED) 45 g 11  . Coenzyme Q10 (COQ10) 100 MG CAPS Take as directed (Patient taking differently: Take 1 capsule by mouth daily. Take as directed) 30 each 11  . cyanocobalamin 1000 MCG tablet Take 1,000 mcg by mouth daily.    . furosemide (LASIX) 40 MG tablet TAKE 1 TABLET  BY MOUTH EVERY DAY AS NEEDED 90 tablet 3  . montelukast (SINGULAIR) 10 MG tablet TAKE 1 TABLET BY MOUTH EVERYDAY AT BEDTIME 90 tablet 4  . nitroGLYCERIN (NITROSTAT) 0.4 MG SL tablet PLACE 1 TABLET UNDER THE TONGUE EVERY 5 MINUTES AS NEEDED. FOR UP TO 3 DOSES. 25 tablet 5  . sildenafil (VIAGRA) 100 MG tablet TAKE 1 TABLET BY MOUTH AS NEEDED FOR ERECTILE DYSFUNCTION. 30 tablet 3  . triamterene-hydrochlorothiazide (MAXZIDE) 75-50 MG tablet Take 1 tablet by mouth daily. 90 tablet 3   Facility-Administered Medications Prior to Visit  Medication Dose Route Frequency Provider Last Rate Last Dose  . 0.9 %  sodium chloride infusion  500 mL Intravenous Continuous Danis, Estill Cotta III, MD        ROS: Review of Systems  Constitutional: Negative for appetite change, fatigue and unexpected weight change.  HENT: Negative for congestion, nosebleeds, sneezing, sore throat and trouble swallowing.   Eyes: Negative for itching and visual disturbance.  Respiratory: Negative for cough.   Cardiovascular: Negative for chest pain, palpitations and leg swelling.  Gastrointestinal: Negative for abdominal distention, blood in stool, diarrhea and nausea.  Genitourinary: Negative for frequency and hematuria.  Musculoskeletal: Positive for arthralgias. Negative for back pain, gait problem, joint swelling and neck pain.  Skin: Negative for rash.  Neurological: Negative for dizziness, tremors, speech difficulty and weakness.  Psychiatric/Behavioral: Negative for  agitation, dysphoric mood and sleep disturbance. The patient is not nervous/anxious.     Objective:  BP (!) 144/78 (BP Location: Left Arm, Patient Position: Sitting, Cuff Size: Large)   Pulse 64   Temp 97.7 F (36.5 C) (Oral)   Ht 5\' 9"  (1.753 m)   Wt 209 lb (94.8 kg)   SpO2 96%   BMI 30.86 kg/m   BP Readings from Last 3 Encounters:  01/17/18 (!) 144/78  10/12/17 (!) 146/78  10/04/17 (!) 145/62    Wt Readings from Last 3 Encounters:  01/17/18 209 lb  (94.8 kg)  10/12/17 211 lb (95.7 kg)  10/04/17 211 lb (95.7 kg)    Physical Exam  Constitutional: He is oriented to person, place, and time. He appears well-developed. No distress.  NAD  HENT:  Mouth/Throat: Oropharynx is clear and moist.  Eyes: Pupils are equal, round, and reactive to light. Conjunctivae are normal.  Neck: Normal range of motion. No JVD present. No thyromegaly present.  Cardiovascular: Normal rate, regular rhythm, normal heart sounds and intact distal pulses. Exam reveals no gallop and no friction rub.  No murmur heard. Pulmonary/Chest: Effort normal and breath sounds normal. No respiratory distress. He has no wheezes. He has no rales. He exhibits no tenderness.  Abdominal: Soft. Bowel sounds are normal. He exhibits no distension and no mass. There is no tenderness. There is no rebound and no guarding.  Musculoskeletal: Normal range of motion. He exhibits no edema or tenderness.  Lymphadenopathy:    He has no cervical adenopathy.  Neurological: He is alert and oriented to person, place, and time. He has normal reflexes. No cranial nerve deficit. He exhibits normal muscle tone. He displays a negative Romberg sign. Coordination and gait normal.  Skin: Skin is warm and dry. No rash noted.  Psychiatric: He has a normal mood and affect. His behavior is normal. Judgment and thought content normal.  obese  Lab Results  Component Value Date   WBC 8.4 07/12/2017   HGB 14.8 07/12/2017   HCT 42.6 07/12/2017   PLT 171.0 07/12/2017   GLUCOSE 135 (H) 07/12/2017   CHOL 136 07/12/2017   TRIG 57.0 07/12/2017   HDL 44.10 07/12/2017   LDLCALC 80 07/12/2017   ALT 25 07/12/2017   AST 20 07/12/2017   NA 138 07/12/2017   K 5.2 (H) 07/12/2017   CL 102 07/12/2017   CREATININE 1.14 07/12/2017   BUN 18 07/12/2017   CO2 32 07/12/2017   TSH 1.95 07/12/2017   PSA 0.48 07/12/2017   INR 1.0 06/09/2016   HGBA1C 5.7 10/15/2014    Dg Chest 2 View  Result Date: 08/20/2016 CLINICAL  DATA:  Recent pneumonia EXAM: CHEST  2 VIEW COMPARISON:  July 22, 2016 FINDINGS: There is currently no edema or consolidation. Heart size and pulmonary vascularity are normal. No adenopathy. There is mild degenerative change in the thoracic spine. There old healed rib fractures on the right. IMPRESSION: Currently no edema or consolidation.  Stable cardiac silhouette. Electronically Signed   By: Lowella Grip III M.D.   On: 08/20/2016 09:56    Assessment & Plan:   There are no diagnoses linked to this encounter.   No orders of the defined types were placed in this encounter.    Follow-up: No follow-ups on file.  [name], MD

## 2018-01-17 NOTE — Assessment & Plan Note (Addendum)
BP discussed - increase Coreg to 25 mg bid

## 2018-01-19 ENCOUNTER — Other Ambulatory Visit: Payer: Self-pay | Admitting: Pulmonary Disease

## 2018-01-24 ENCOUNTER — Other Ambulatory Visit: Payer: Self-pay | Admitting: Internal Medicine

## 2018-01-24 ENCOUNTER — Telehealth: Payer: Self-pay | Admitting: Pulmonary Disease

## 2018-01-24 MED ORDER — PANTOPRAZOLE SODIUM 40 MG PO TBEC: 40.0000 mg | DELAYED_RELEASE_TABLET | Freq: Every day | ORAL | 0 refills | Status: DC

## 2018-01-24 MED ORDER — PRAVASTATIN SODIUM 20 MG PO TABS: 20.0000 mg | ORAL_TABLET | Freq: Every day | ORAL | 3 refills | Status: DC

## 2018-01-24 NOTE — Telephone Encounter (Signed)
Pt last seen at office 12/27/16 and at this visit, pt was told to follow up in 6 months.  No follow up appt has been scheduled for pt. Pt needs to schedule follow up appt for further refills.  Called and spoke with pt letting him know we needed to schedule a follow up appt in order for him to receive more refills of his med.  Pt expressed understanding. Follow up appt has been scheduled for pt to see SN Tuesday, 10/15 at 11am. I did send a week worth of med to pt's preferred pharmacy as he was completely out.  Stated to pt at his f/u appt we could give him more med refills at that time. Nothing further needed.

## 2018-01-31 ENCOUNTER — Encounter: Payer: Self-pay | Admitting: Pulmonary Disease

## 2018-01-31 ENCOUNTER — Ambulatory Visit: Payer: Medicare Other | Admitting: Pulmonary Disease

## 2018-01-31 VITALS — BP 126/78 | HR 56 | Temp 97.5°F | Ht 69.0 in | Wt 207.4 lb

## 2018-01-31 DIAGNOSIS — I5032 Chronic diastolic (congestive) heart failure: Secondary | ICD-10-CM

## 2018-01-31 DIAGNOSIS — R768 Other specified abnormal immunological findings in serum: Secondary | ICD-10-CM

## 2018-01-31 DIAGNOSIS — I10 Essential (primary) hypertension: Secondary | ICD-10-CM

## 2018-01-31 DIAGNOSIS — J449 Chronic obstructive pulmonary disease, unspecified: Secondary | ICD-10-CM

## 2018-01-31 DIAGNOSIS — I2510 Atherosclerotic heart disease of native coronary artery without angina pectoris: Secondary | ICD-10-CM | POA: Diagnosis not present

## 2018-01-31 MED ORDER — PANTOPRAZOLE SODIUM 40 MG PO TBEC: 40.0000 mg | DELAYED_RELEASE_TABLET | Freq: Every day | ORAL | 3 refills | Status: DC

## 2018-01-31 MED ORDER — BUDESONIDE-FORMOTEROL FUMARATE 160-4.5 MCG/ACT IN AERO: INHALATION_SPRAY | RESPIRATORY_TRACT | 3 refills | Status: DC

## 2018-01-31 MED ORDER — BUPROPION HCL ER (SR) 150 MG PO TB12: 150.0000 mg | ORAL_TABLET | Freq: Two times a day (BID) | ORAL | 3 refills | Status: DC

## 2018-01-31 NOTE — Patient Instructions (Addendum)
Today we updated your med list in our EPIC system...    Continue your current medications the same...  We refilled your Protonix, Wellbutrin, and symbicort per request...  We discussed my up-coming retirement and I feel certain that DrPlotnikov would be happy to refill these meds when needed down the road...  Similarly he can f/u your CXR & respiratory status-- and he can always refer you back to the pulmonary division if needed for breathing issues in the future...  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    My best wishes for good health & happiness in the years to come.Marland KitchenMarland Kitchen

## 2018-02-03 ENCOUNTER — Encounter: Payer: Self-pay | Admitting: Pulmonary Disease

## 2018-02-03 NOTE — Progress Notes (Signed)
Subjective:    Patient ID: Larry Stone, male    DOB: 1950-11-28, 67 y.o.   MRN: [id number]  HPI 67 y/o WM here for a followup visit... he has multiple medical problems as noted below...  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 and DrPlotnikov for Primary Care- Chol, Impaired fasting glucose, etc... ~  SEE PREV EPIC NOTES FOR OLDER DATA >>    CT Chest 01/2010>>  1.5mm right middle lobe lung nodule is similar back to the abdominal CT of 02/16/2003, consistent with benignity, most likely a subpleural lymph node;  2. No acute process in the chest;  3 Distal esophageal wall thickening again suggests esophagitis;  4.Mild centrilobular emphysema;  5.Age advanced atherosclerosis.  CXR 1/14 showed normal heart size, clear lungs, chr right rib deformity (old fx), NAD...  LABS 1/14:  FLP- at goals on Lip40+Niasp;  Chems- wnl;  CBC- wnl;  TSH=1.93;  PSA=0.39;  Testos=307 (350-890) on Testim 2tubes/d;  B12=1005 (211-911) on B12 2016mcg/d...  LABS 4/15:  FLP- at goals on Lip40;  Chems- ok w/ BS=115, A1c=6.0, Cr=1.2;  CBC- wnl;  TSH=1.55;  PSA=0.39...   CXR 8/15 showed normal heart size, clear lungs w/o infiltrates, old healed right rib fxs, NAD...   PFT 8/15 showed FVC=2.32 (50%), FEV1=1.63 (45%), %1sec=70, mid-flows=33% predicted; suggestive of GOLD Stage 3 COPD but can't r/o superimposed restriction w/o lung volume measurement...  LABS 8/15:  SPE/IEP w/o monoclonal gammopathy; Quant Ig's showed normal IgG, normal IgA, low IgM at 26 (40-250), and a hi IgE at 1362 => we will follow w/ RAST panel & consider Xolair Rx...  RAST Panel 11/15 => returned w/ IgE level = 1497 and Allergen panel pos for some molds, grasses, trees, ragweed, cats>dogs; and Food panel pos for Milk> Wheat> Shrimp/ Tomato/ Peanut, etc.  ~  March 18, 2014:  25mo ROV & f/u dyspnea> [name] states that the [facility] was  $400 at the Pharm & they/he didn't call for replacement inhaler, states he filled it one time & ran out several wks ago, we discussed what to do in that eventuality (call us to let us know & get a different Rx), we will try SYMBICORT160-2spBid & he will let us know what his [employer] co-pay is for this med;  Similarly he did not return to get the RAST tests done, but he informs me that they were drawn several days ago w/ blood work for [employer]- results pending...  We reviewed his prev Hx/ symptoms, exam (chest is clear today), and evaluation; all questions answered, we decided to try the Symbicort160 regularly over the next 41mo & plan f/u Full-PFTs in the future;  In the meanwhile we will await the results of the RAST tests and decide regarding Xolair vs Allergy evaluation...    We reviewed prob list, meds, xrays and labs> see below for updates >> He was given the 2015 flu vaccine recently and the Prevnar-13 vaccination as well...  LABS 11/15:  FLP- at goals on Lip40;  Chems- wnl w/ BS=105, A1c=6.1;  CBC- wnl w/ Hg=15.1;  TSH=1.77, PSA=0.50...   RAST Test 11/15:  returned w/ IgE level = 1497 and Allergen panel pos for some molds, grasses, trees, ragweed, cats>dogs; and Food panel pos for Milk> Wheat> Shrimp/ Tomato/ Peanut, etc... We discussed avoidance strategies and other options- he is interested in Xolair & we will proceed w/ the paperwork for Xolair  approval...   ~  October 15, 2014:  30mo ROV & [name] is still awaiting insurance approval for Xolair!  He reports removing 2 ticks from his privates recently!  Weight down 12# on diet and he notes some improvement in his dyspnea- now just noted w/ intense activ he says eg- climbing/ stairs/ etc but walking is ok...     OSA> s/p UPPP surg 2004; uses Transderm scope prn dizziness, uses "breathe right nasal strips" periodically; resting OK, no daytime hypersomnolence...    COPD & HxBronchitis> on Symbicort160-2Bid & Zyrtek prn; doing satis w/o cough,  phlegm, etc...    Elev IgE at 1362 & 1497; see RAST results above;  We are awaiting approval for Xolair...    His last Cards check was 12/25 w/ DrMcAlhany> Hx MI in 2004 w/ resid 50% Lmain; had CardiacCT 2011 w/ 50% LAD, Myoview 2015 w/ inferolat scar, no ischemia, EF=52%; clinically stable & not c/o CP & no changes made...    He had medical f/u DrPlotnikov 5/16> HBP, CAD, HL, anxiety- all stable on meds and no changes made...    He continues to f/u w/ Podiatry- DrRegal> seen 4/16 for heel pain, plantar faciitis- injected & given brace + oral anti-inflamm rx... We reviewed prob list, meds, xrays and labs> see below for updates >>  ADDENDUM>>  [name] was denied by his insurance company...  ~  April 04, 2015:  2mo ROV & Don called for an add-on appt >  He is c/o a 7mo hx sudden onset SOB/DOE, fatigue, cough & chest congestion w/ sl beige phlegm, no hemoptysis, no CP x sore from coughing, and no f/c/s;  We called in Pred dosepak but he says no better, not resting, ?cough worse Qhs, incr SOB & he denies any reflux/ GERD, etc... He has been using the Symbicort160-2spBid & Zyrtek10... EXAM shows Afeb, VSS, O2sat=98% on RA;  HEENT- neg, mallamapti2;  Chest- decr BS at bases, dry cough, clear w/o w/r/r;  Heart- RR w/o m/r/g;  Abd- soft, neg;  Ext- neg w/o c/c/e...   CXR 04/04/15 showed norm heart size, clear lungs, old right rib fx, NAD...  IMP/PLAN>>  [name] appears to have a refractory COPD exac & we discussed additional treatment w/ ZPak, Depo80/Medrol8mg  tapering sched, add INCRUSE one sp daily, Mucinex 1200mg Bid + fluids, and OK to use mother's old NEB machine  W/ Albut Tid;  Also wrote for [facility] for prn use...  ~  May 01, 2015:  22mo ROV & Don reports that he is better overall from his refractory AB but cough is lingering w/ a definite night-time pattern;  Notes some wheezing & chest soreness;  He has been on mother's NEB machine Bid, Symbicort160-2spBid, Incruse daily, Medrol8 taper- down to  1/2Qod, Hycodan prn;  We decided to continue the Medrol4mg Qod & institute a vigorous antireflux regimen- Protonix40 taken 57min before dinner, NPO after dinner, elev HOB 6"... EXAM shows Afeb, VSS, O2sat=98% on RA;  HEENT- neg, mallamapti2;  Chest- decr BS at bases, min cough & exp wheezing w/o consolidation;  Heart- RR w/o m/r/g;  Abd- soft, neg;  Ext- neg w/o c/c/e...  IMP/PLAN>>  [name] is improved but w/ persist symptoms esp cough at night- we decided to continue the Medrol4mg  Qod & institute a vigorous antireflux regimen... Plan ROV 6-8wks.  ~  June 30, 2015:  5mo ROV & [name] reports that his breathing is better, min chest congestion & non-productive cough, still notes occas wheezing; no CP, no tightness, no f/c/s;  NOTE>  HE HAS STOPPED THE MEDROL& STOPPED THE NEBS on his own, still using the Symbicort & Incruse regularly; rec to add MUCINEX600-2Bid w/ fluids & [facility]...  EXAM shows Afeb, VSS, O2sat=98% on RA;  HEENT- clears throat/ ?VCD, mallamapti2;  Chest- decr BS at bases, min cough & exp wheezing w/o consolidation;  Heart- RR w/o m/r/g;  Abd- soft, neg;  Ext- neg w/o c/c/e...  IMP/PLAN>>  We will refer to ENT for completeness & upper airway symptoms, throat clearing, etc; he needs to maintain a vigorous antireflux regimen + Symbicort/ Incruse/ Albut NEBS...   ~  August 12, 2015:  6wk ROV & add-on appt requested due to incr dyspnea>  [name] reports that he was doing satis on the Symbicort160-2spBid & Incruse daily (having prev stopped the NEB rx and Medrol) but called yest w/ temp 103 he says, felt "out of it" w/ chills, aching/sore, fatigued/ weak and incr cough w/o much sput production;  He says he saw ENT in the interim- nothing wrong, no change in rx recommended (we do not have note from them);  Note- his wt is down 11# to 211# today...    OSA> s/p UPPP surg 2004; uses Transderm scope prn dizziness, uses "breathe right nasal strips" periodically; resting OK, no  daytime hypersomnolence...    COPD & HxBronchitis> on Symbicort160-2Bid & Incruse daily; has his mother's old NEB machine & using Albut prn;  Now c/o dry cough, T103, etc (as above)=>     Elev IgE at 1362 & 1497; see RAST results above;  His insurance denied Xolair rx; he has been on steroids and uses Zyrtek10...    His last Cards check was 05/08/15 w/ DrMcAlhany> Hx MI in 2004 w/ resid 50% Lmain; had CardiacCT 2011 w/ 50% LAD, Myoview 2015 w/ inferolat scar, no ischemia, EF=52%;  Myoview 6/15 showed inferolat scarno ischemia, EF=52%; clinically stable & not c/o CP & no changes made...    He had medical f/u DrPlotnikov 5/16> HBP, CAD, HL, anxiety- all stable on meds and no changes made...    He continues to f/u w/ Podiatry- DrRegal> seen 4/16 for heel pain, plantar faciitis- injected & given brace + oral anti-inflamm rx... EXAM shows Afeb, VSS, O2sat=92% on RA;  HEENT- clears throat/ ?VCD, mallamapti2;  Chest- decr BS at bases, min cough & no w/r/r/ or consolidation;  Heart- RR w/o m/r/g;  Abd- soft, neg;  Ext- neg w/o c/c/e...   CXR 08/12/15> norm heart size, no acute pulm infiltrates/ effusion/ etc, right lat pleural thickening; degen changes in spine...  LABS 08/12/15> Chems- wnl  X BS=121, Cr=1.2, TBili=1.8;  BNP= 35;  CBC- ok x WBC=15.7, Hg=14.9;  Sed=41; IMP/PLAN>>  We decided to treat w/ LEVAQUIN x7d, Depo80, PRED20mg - 7d tapering sched; use NEB w/ DUONEB Tid followed by Symbicort & Incruse; continue to follow the vigorous antireflux regimen; ROV in 3 weeks time...  ~  Sep 03, 2015:  3wk ROV & [name] reports some improvement on his regimen of PRED20mg -1/2 tab Qam now, NEBs w/ Duoneb Tid, Symbicort160-2spBid, Incruse daily; he notes "improved, but not well" w/ CC= dry cough, in paroxysms, hacking, small amt thick sputum, no f/c/s, no CP, no SOB x w/ the coughing spells; he notes that he coughs in restaurants, notes the coughing spells occur w/o rhyme or reason, better if he wals outside...  EXAM shows  Afeb, VSS, O2sat=98% on RA;  HEENT- neg, mallamapti2;  Chest- decr BS at bases, clear, no w/r/r/ or  consolidation;  Heart- RR w/o m/r/g;  Abd- soft, neg;  Ext- neg w/o c/c/e...  IMP/PLAN>>  [name] needs a new NEBULIZER machine; he is asked to wean the Pred from 10mg /d to 10mg  alternating w/ 5mg  QOD; contuinue the Duoneb tid, Symbicort-2Bid, Incruse daily, Hycodan prn; he needs to redouble his efforts at the antireflux regimen;  We will refer to DrKozlow for his assessment of refractory asthmatic bronchitis- recall the RAST was abn 2015 & IgE level ~1400 but insurance denied Xolair... NOTE>>  We gave him the second Pneumovax 23 today & this gets him up-to-date on his immuniz.  ~  October 15, 2015:  6wk ROV & [name] has established w/ DrKozlow for allergy/immunology work up & to try & get him approved for Xolair;  He has done quite a bit of reading on the internet & asked several good questions about his condition- answered to the best of my ability;  Currently taking Symbicort160-2spBid, Incruse once daily, Pred 10mg  tabs tapering (tapering off more quickly on DrKozlow's protocol), Singulair10, NEBS w/ ?Albut2.5 prn (we had him on Duoneb Tid), Zyrtek10, Hycodan prn;  DrKozlow has started the paperwork for Xolair shots thru his new insurance;  He reports that his breathing is better- sl cough, sm amt beige sput, no hemoptysis, improved SOB, no CP, no recent f/c/s, etc...    EXAM shows Afeb, VSS, O2sat=97% on RA;  HEENT- clears throat min/ ?VCD, mallamapti2;  Chest- decr BS at bases, no cough & no w/r/r;  Heart- RR w/o m/r/g;  Abd- soft, neg;  Ext- neg w/o c/c/e...   Spirometry x2 by DrKozlow 6/13 & 6/27 showed FEV1 improved 33% to 1.98L (61% pred) on his regimen...  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 IMP/PLAN>>  COPD w/ Asthma, improved w/ adjustments from DrKozlow, they are continuing his COPD meds Symbicort/ Incruse), added Singulair, and changed Duoneb to albut prn; he has  also highlighted the need for anti-reflux regimen (ProtonixAM & Ranitadine-PM); hoping for approval of Xolair, they plan f/u in 2-75mo and he will call us in the interim for any issues...  ~  July 22, 2016:  24mo ROV & post hospital visit>  I last saw [name] ~27mo ago- see above, he has established w/ DrKozlow for an allergy/ immunology work up & DrPlotnikov for PCP>>    DrKozlow's eval revealed mixed COPD/Emphysema w/ revers (asthmatic) component, allergic rhinitis, LPRD; he weaned [name] off Pred, continued Symbicort, stopped Incruse, continued Singulair/ Rhinocort, AlbutHFA & had him stop the NEBS; they submitted for Xolair but cost from his insurance was prohibitive, continued his antireflux regimen & allergen avoidance etc... Pt did not f/u w/ DrKozlow after 12/2015 visit...     Pt had general medical check w/DdrPlotnikov in [id number]- notes reviewed...    Then in Feb2018 he saw DrMcAlhany in [place] for [place]- known HBP, CAD- s/p IWMI 2004 w/ med rx, HL;  Notes reviewed- pt had CATH 06/17/16 showing nonobstructive CAD w/ 30-40% Lmain; norm LV sys function, elev filling pressures=> started Lasix40 daily... He hasn't seen Cards back in the office yet...    Then he saw PCP back 06/30/16> ?what was going on? c/o arthralgias? They stopped his Symbicort?    He presented to the ER 07/13/16 w/ dry cough, incr SOB, wheezing, hypoxemic and Temp to 103;  He was felt to be septic, given 3L IVF, no sputum, BC neg, only pos titer was FluB; Treated w/ O2, Levaquin/ Vanco empirically + Tamiflu &  Solumedrol;  CXR showed sl incr markings, no infiltrate; subseq CT Chest showed bilat patchy GGO thought c/w infection;  He was disch home on 3/30 w/ Doxy, Tamiflu, Pred10/d, NEBS, Symbicort160, Singulair & GFN...   CXR 07/13/16 showed decr lung vols, mild peribronch cuffing but no consolidative airsp dis, +Ao atherosclerosis, otherw neg...  EKG 07/13/16>  NSR, rate92, RBBB, no acute changes...  CT Chest 07/14/16 showed norm heart size &  Ao and coronary atherosclerosis, no pathologically enlarged LNs, patchy multifocal GGO in RML, RLL, LLL, no masses, upper abd & bones all neg...  LABS 06/2016>  Chems- ok x BS120-160, Cr was ok at 1.00;  Lactate was elev at 2.77 & PCT was elev at 0.77;  CBC was ok w/ Hg=12.2, mcv=94, WBC=10.2    OSA> s/p UPPP surg 2004; not on CPAP; uses Transderm scope prn dizziness, uses "breathe right nasal strips" periodically; resting OK, no daytime hypersomnolence...    COPD w/ revers component & Hx recurrent bronchitic infections in the past> DrKozlow had stopped most of his meds in Sep2017, DrPlot stopped Symbicort 12/17; he developed FluB pneumonitis 3/18 as above=> still feeling weak, fatigued, cough w/ sm amt yellow sput, SOB/DOE w/ ADLs now; no f/c/s, no CP/ palpit, 1+edema in LEs...    Elev IgE at 1362 & 1497; see RAST results;  His 1st insurance denied Xolair rx; 2nd insurance approved it but co-pay was too high...    CARDS issues> followed by Vivi Barrack- Hx MI in 2004 w/ resid 50% Lmain; had CardiacCT 2011 w/ 50% LAD, Myoview 2015 w/ inferolat scar, no ischemia, EF=52%;  Cathed 06/2016 for incr DOE w/ 30-40% Lmain otherw nonobstructive dis, norm sys function but elev filling pressures=> DrMcAlhany started Lasix40 but this was stopped w/ [facility] 3/18...    Medical issues> followed by DrPlotnikov- HBP, CAD, HL, anxiety    He continues to f/u w/ Podiatry- DrRegal> seen 4/16 for heel pain, plantar faciitis- injected & given brace + oral anti-inflamm rx...    EXAM shows Afeb, VSS, O2sat=93% on RA;  HEENT- s/p UPPP, ?VCD, mallamapti2;  Chest- decr BS at bases, mild cough, rales right base, scat rhonchi, no consolidation;  Heart- RR gr1/6 SEM w/o r/g;  Abd- soft, neg;  Ext- 1+edema w/o c/c;  Neuro- intact w/o focal deficits...  CXR 07/22/16>  Sl incr markings in medial LLL but no progressive opacity, effusions, etc...  Ambulatory oximetry 07/22/16>  O2sat=93% on RA at rest w/ pulse=61/min;  He ambulated 2 laps in office  (185'ea) & stooped due to fatigue w/ lowest O2sat=91% w/ pulse=73/min...  LABS 07/22/16>  Chems- ok w/ BS=89, Cr=1.18, LFTs wnl;  CBC- ok w/ Hg=15.3, WBC=13.6;  BNP=59;  Sed=18... IMP/PLAN>>  [name] appears to have had a viral pneumonia & his serology was pos for FluB; no bacterial etiology established;  This has flared his COPD/ reactive airways component requiring reinstitution of his NEBS Tid, Symbicort160-2spBid, Singulair, and MUCINEX 1200mg  bid w/ fluids;  We placed him back on a slow Medrol taper for the airway inflammation;  Asked to rest at home w/ fluids but NO SALT & we plan rov recheck in 2-3 weeks... Note: >50% of this 96min ov was spent in counseling & coordination of care...  ~  August 12, 2016:  3wk ROV & pulm recheck>  [name] returns and indicates a great response to prev Rx, feeling much better- SOB improved, & able to get about more now;  Last OV 07/22/16 c/o 33mo hx COPD exac (?FluB) including Hosp  &  we treated him w/ MEDROL 8mg Bid- slow taper, AlbutNEB Tid, Symbicort160-2spBid, Singulair10, Mucinex, fluids;  He took his last Medrol 1/2 tab recently & has continue the other med regularly;  He is concerned because he is going on a trip to [place] soon- cruise, train, bus, etc => we agreed to write for Levaquin500, Medrol Dosepak, & ProairHFA for prn needs...     EXAM shows Afeb, VSS, O2sat=98% on RA;  HEENT- s/p UPPP, ?VCD, mallamapti2;  Chest- decr BS at bases, clear now, no consolidation;  Heart- RR gr1/6 SEM w/o r/g;  Abd- soft, neg;  Ext- tr+edema w/o c/c;  Neuro- intact w/o focal deficits... IMP/PLAN>>  [name] is much improved s/p COPD exac likely viral flu mediated AB;  Off Ab/s & finished Medrol- he is asked to remain active, & take his NEB Tid (slowly wean), Symbicort Bid, Singulair, Mucinex, fluids;  As noted we gave him Levaquin, Medrol dosepak, Proair for his upcoming trip to [place]...  ~  December 27, 2016:  4-2mo ROV & pulmonary follow up visit>  He reports a good 68mo interval w/o new  complaints or concerns, notes min wheezing "if I'm dusting" but no asthma attacks; using NEBS prn, Symbicort160-2spBid, Singulair10 Qd; he wants to decr the Symbicort to 2sp once daily to save $$ since he is doing so well, we discussed the need to bump back up to 2spBid at first sign of asthma/COPD breathing trouble...  We reviewed the following interval Epic notes >>     He saw CARDS-DrMcAlhany on 12/22/16>  HBP, CAD- s/p MI 4854, chr diastolic CHF, HL, & OSA; Cath 06/17/16 showed 40% Lmain, mild dis in Diagonal & Circ; not c/o chest discomfort- continue same meds, f/u 39yr...    He saw PCP-DrPlotnikov on 12/22/16>  HBP, CAD, anxiety (on Wellbutrin & Xanax); note reviewed, chest was clear & heart sounds wnl; doing well- no changes made...  We reviewed the following medical problems during today's office visit >>     OSA> s/p UPPP surg 2004; not on CPAP; uses Transderm scope prn dizziness, uses "breathe right nasal strips" periodically; resting OK, wakes refreshed, no daytime hypersomnolence...    COPD w/ revers component & Hx recurrent bronchitic infections in the past> Last FEV1 2015 was 1.63 (45%); DrKozlow had stopped most of his meds in Sep2017, DrPlot stopped Symbicort 12/17; he developed FluB pneumonitis 3/18 => w/ persistent weak, fatigued, cough w/ sm amt yellow sput, SOB/DOE w/ ADLs; no f/c/s, no CP/ palpit, 1+edema in LEs=> we restarted NEBS, Symbicort160-2spBid, Singulair10, Zyrtek10...    AR & Elev IgE at [place]; see RAST results;  His 1st insurance denied Xolair rx; 2nd insurance approved it but co-pay was too high; he has been eval by DrKozlow, could not afford Xolair but fortunately he has not had any COPD exac since 06/2016 [place]...    CARDS issues> followed by Vivi Barrack- Hx MI in 2004 w/ resid 50% Lmain; had CardiacCT 2011 w/ 50% LAD, Myoview 2015 w/ inferolat scar, no ischemia, EF=52%;  Cathed 06/2016 for incr DOE w/ 30-40% Lmain otherw nonobstructive dis, norm sys function but elev filling  pressures=> DrMcAlhany started Lasix40 but this was stopped w/ [facility] 3/18...    Medical issues> followed by DrPlotnikov- HBP, CAD, HL, anxiety    He continues to f/u w/ Podiatry- DrRegal> seen 4/16 for heel pain, plantar faciitis- injected & given brace + oral anti-inflamm rx... EXAM shows Afeb, VSS, Wt=217#, BMI=31, O2sat=98% on RA;  HEENT- s/p UPPP, ?VCD, mallamapti2;  Chest- decr  BS at bases, clear now, no consolidation;  Heart- RR gr1/6 SEM w/o r/g;  Abd- soft, neg;  Ext- tr+edema w/o c/c;  Neuro- intact w/o focal deficits...  Spirometry 12/2015 by DrKozlow>  FVC=2.57 (60%), FEV1=2.06 (60%), %1sec=80, mid-flows reduced at 55% predicted...  CXR 08/20/16>  Norm heart size, no adenopathy, clear lungs- no edema or consolidation, degen changes in Tspine, old healed right rib fxs...  LABS in Epic 08/2016>  Chems- wnl;  CBC- wnl (3%eos);  Sed=18 IMP/PLAN>>  [name] is stable on his Symbicort & Singulair, he wants to decr the Symbicort to 2sp once daily- while I'd prefer he continue the Bid dosing, he is adamant about the reduction- reminded to bump back up to 2spBid + NEBS at first sign of any resp problem;  He had the 2018 Flu she by DrPlotnikov & the 1st shingRix shot (c/o low grade temp, musc aches & pains after this vax)...   ~  January 31, 2018:  43mo ROV & pulmonary follow up visit>                 Problem List:     ALLERGIC to ASA w/ hives, & ACE inhibitors/ ARBs too w/ angioedema.  OBSTRUCTIVE SLEEP APNEA (ICD-327.23) - s/p UPPP surgery in 2004 by DrRedman...  states he's doing OK, denies snoring, no daytime hypersom, wife not complaining... uses ZYRTEK for allergy symtoms. ~  1/13: He has started using the "breathe right nasal strips" qhs for recurrent snoring; still says he rests well, denies daytime hypersomnolence, & declines offer for repeat sleep study to check... ~  7/14:  He reports resting well, no daytime symptoms or sleep issues...  ~  8/15:  He denies sleep disordered breathing... ~   6/16:  He remains asymptomatic- no snoring, daytime hypersomnolence, etc...  Hx of BRONCHITIS, RECURRENT (ICD-491.9) - he is an ex-smoker, prev 1 ppd for 70yrs, quit in 2004. COPD, mixed type- w/ mild centrilobular emphysema on CT Chest 10/11, & chronic bronchitis clinically w/ recurrent infections... ~  Hx tiny RML nodule seen on Cardiac CT 2/11 & apparently unchanged from old [place] 2004... ~  10/11: f/u CT Chest for f/u tiny 51mm nodule RML- unchanged xyrs & benign, mild centilob emphysema, +coronary calcif, NAD... ~  1/13: he denies breathing problems, intercurrent infections, etc... ~  1/14:  CXR 1/14 showed normal heart size, clear lungs, chr right rib deformity (old fx), NAD.  ~  8/15: he was concerned about several recent bouts of bronchitis requiring ZPak for Rx> we checked labs and Immunoglobulins =>   Decided to start inhaler w/ DULERA100-2spBid...  ~  CXR 8/15 showed normal heart size, clear lungs w/o infiltrates, old healed right rib fxs, NAD. ~  PFT 8/15 showed FVC=2.32 (50%), FEV1=1.63 (45%), %1sec=70, mid-flows=33% predicted; suggestive of GOLD Stage 3 COPD but can't r/o superimposed restriction w/o lung volume measurement. ~  LABS 8/15 showed SPE/IEP w/o monoclonal gammopathy; Quant Ig's showed normal IgG, normal IgA, low IgM at 26 (40-250), and a hi IgE at 1362 => we will follow w/ RAST panel & consider Xolair Rx. ~  11/15: he didn't fill the [facility] due to cost- we decided to try SYMBICORT160-2spBid... ~  LABS 11/15 showed elev IgE level and mixed RAST panel allergens; discussed avoidance & he is a good cand for [employer] => insurance would NOT approve this med... ~  12/16: on Symbicort160-2spBid + using his mother's nebulizer +Albut; presented w/ refractory COPD exac=> treated w/ ZPak, Medrol taper, add Incruse & use  NEB Tid, mucinex + Hycodan...  ~  1/17: on mother's NEB machine Bid, Symbicort160-2spBid, Incruse daily, Medrol8 taper- down to 1/2Qod, Hycodan prn;  We decided to  continue the Medrol4mg Qod & institute a vigorous antireflux regimen- Protonix40 taken 87min before dinner, NPO after dinner, elev HOB 6". ~  3/17:  He is improved and backed off on many of his meds... ~  4/17:  He presents w/ recurrent AB => treated w/ Levaquin, Prednisone20mg - 7d tapering sched, NEBs w/ DUONEB Tid followed by Symbicort160 & Incruse... ~  6/17>  He had Allergy eval from DrKozlow... ?approved for Xolair by his co-pays were prohibitive... ~  3/18>  He was Adm w/ apparent FluB pneumonitis & acute hypoxemic resp failure => responded to O2, broad spectrum Ab's + Tamiflu, Solumedrol, NEBS, Symbicort, etc... ~  4/18>  Persistent AB symptoms- given more Medrol (slower taper) + continue other Rx=> improved & back to baseline.   MEDICAL PROBLEMS PER DrPlotnikov >>   HYPERTENSION (ICD-401.9) - on COREG 12.5mg Bid, AMLODIPINE 5mg /d, & LASIX 40mg - only as needed (& he hasn't needed any); BP= 116/72 & he denies CP, palpit, ch in SOB, edema...  ARTERIOSCLEROTIC HEART DISEASE (ICD-414.00) - on PLAVIX 75mg /d, allergic to ASA... followed by [facility] and seen 6/15 (note reviewed)... 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 ~  f/u Cardiac CT 2/11 showed>  Calcified plaque in the left main with < 50% stenosis.  Calcified plaque in the proximal and mid LAD with at most moderate (around 50%) stenosis in the mid LAD. ~  Vasc Screen per Cards 10/11 showed mild carotid plaques, norm AbdAo & ABIs... ~  CATH 05/2016 by Vivi Barrack w/ 30-40% Lmain, nonobstructive dis in 1st diag 7 Circ, norm sys function 7 elev filling pressures=> med rx w/ Lasix40 added...  HYPERLIPIDEMIA (ICD-272.4) - on LIPITOR 40mg /d, & CoQ10... Last FLP 11/15 on Lip40 showed TChol 153, TG 42, HDL 53, LDL 92  DIABETES MELLITUS, BORDERLINE (ICD-790.29) - on diet alone... Labs 11/15- weight= 224#, BS=105, A1c=6.1  GERD (ICD-530.81) - he uses H2 blockers as needed.  DIVERTICULOSIS OF COLON  (ICD-562.10) & COLONIC POLYPS (ICD-211.3) - his last polyps were ~53mm and removed in 2004= adenomatous... there is a +fam hx of colon cancer in his father who died at age 85... ~  Last colonoscopy 5/14 by DrKaplan (done w/ pt on Plavix rx)> mild divertics & 72mm polyp in desc colon removed (tubular adenoma) & repeat rec in 5 yrs.   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 ~  3/15: he is off the prev Testos replacement Rx> states he feels well, good energy, PSA=0.39  DEGENERATIVE JOINT DISEASE (ICD-715.90) - he reports doing fair- mostly c/o knees & hands ("I messed up my knee in a fall")... he saw Ortho ?who? on Glucosamine/ Chondroitin supplements.  LOW BACK PAIN SYNDROME (ICD-724.2)  MEMORY LOSS >>  ~  1/13: he states that his "memory is fuzzy" & "Clarity is missing"; occas he will swear that he said something but he really didn't; he's had prev scans etc & I have recommended a Neurology eval for his memory but he declines at this time "I'll think about it" he said...  ANXIETY (ICD-300.00) - on WELLBUTRIN 150mg Bid, & ALPRAZOLAM 0.5mg  Prn... increased stress w/ mother's stroke last year... he's the main care giver w/ 2 sisters who don't help much...   Health Maintenance: ~  Immuniztions:  he is encouraged to  get the yearly Flu vaccine... given PNEUMOVAX 2/12 & PREVNAR-13 given 11/15... He reports a lot of travel & he's been to the travel clinic & had "every vaccine known to man"> TDAP 7/14 and Zoster 5/14...   Past Surgical History:  Procedure Laterality Date  . CARDIAC CATHETERIZATION    . LEFT HEART CATH AND CORONARY ANGIOGRAPHY N/A 06/17/2016   Procedure: Left Heart Cath and Coronary Angiography;  Surgeon: Burnell Blanks, MD;  Location: [facility];  Service: Cardiovascular;  Laterality: N/A;  . UVULOPALATOPHARYNGOPLASTY     surgery for OSA    Outpatient Encounter Medications as of 01/31/2018  Medication Sig  . albuterol  (PROAIR HFA) 108 (90 Base) MCG/ACT inhaler Inhale 1-2 puffs into the lungs every 6 (six) hours as needed for wheezing or shortness of breath.  Marland Kitchen albuterol (PROVENTIL) (2.5 MG/3ML) 0.083% nebulizer solution Use one vial in the nebulizer every 4-6 hours if needed for cough or wheeze  . ALPRAZolam (XANAX) 0.5 MG tablet TAKE 1 TABLET BY MOUTH THREE TIMES A DAY AS NEEDED  . ammonium lactate (LAC-HYDRIN) 12 % lotion Apply topically as directed.  . budesonide-formoterol (SYMBICORT) 160-4.5 MCG/ACT inhaler INHALE 2 PUFFS INTO THE LUNGS 2 (TWO) TIMES DAILY.  Marland Kitchen buPROPion (WELLBUTRIN SR) 150 MG 12 hr tablet Take 1 tablet (150 mg total) by mouth 2 (two) times daily.  . carvedilol (COREG) 25 MG tablet Take 1 tablet (25 mg total) by mouth 2 (two) times daily with a meal.  . cetirizine (ZYRTEC) 10 MG tablet Take 1 tablet (10 mg total) by mouth daily.  . Cholecalciferol (VITAMIN D3) 5000 UNITS CAPS Take one tablet by mouth daily  . clopidogrel (PLAVIX) 75 MG tablet TAKE 1 TABLET BY MOUTH EVERY DAY  . clotrimazole-betamethasone (LOTRISONE) cream APPLY AS DIRECTED (Patient taking differently: Apply 1 application topically 2 (two) times daily as needed. APPLY AS DIRECTED)  . Coenzyme Q10 (COQ10) 100 MG CAPS Take as directed (Patient taking differently: Take 1 capsule by mouth daily. Take as directed)  . cyanocobalamin 1000 MCG tablet Take 1,000 mcg by mouth daily.  . furosemide (LASIX) 40 MG tablet TAKE 1 TABLET BY MOUTH EVERY DAY AS NEEDED  . montelukast (SINGULAIR) 10 MG tablet TAKE 1 TABLET BY MOUTH EVERYDAY AT BEDTIME  . nitroGLYCERIN (NITROSTAT) 0.4 MG SL tablet PLACE 1 TABLET UNDER THE TONGUE EVERY 5 MINUTES AS NEEDED. FOR UP TO 3 DOSES.  Marland Kitchen pantoprazole (PROTONIX) 40 MG tablet Take 1 tablet (40 mg total) by mouth daily.  . pravastatin (PRAVACHOL) 20 MG tablet Take 1 tablet (20 mg total) by mouth daily.  . sildenafil (VIAGRA) 100 MG tablet TAKE 1 TABLET BY MOUTH AS NEEDED FOR ERECTILE DYSFUNCTION.  Marland Kitchen  triamterene-hydrochlorothiazide (MAXZIDE) 75-50 MG tablet Take 1 tablet by mouth daily.  . vitamin C (ASCORBIC ACID) 500 MG tablet Take 500 mg by mouth daily.  . [DISCONTINUED] budesonide-formoterol (SYMBICORT) 160-4.5 MCG/ACT inhaler INHALE 2 PUFFS INTO THE LUNGS 2 (TWO) TIMES DAILY.  . [DISCONTINUED] buPROPion (WELLBUTRIN SR) 150 MG 12 hr tablet TAKE 1 TABLET BY MOUTH TWICE A DAY  . [DISCONTINUED] pantoprazole (PROTONIX) 40 MG tablet Take 1 tablet (40 mg total) by mouth daily.   Facility-Administered Encounter Medications as of 01/31/2018  Medication  . 0.9 %  sodium chloride infusion    Allergies  Allergen Reactions  . Ace Inhibitors Swelling  . Angiotensin Receptor Blockers Swelling  . Aspirin Hives  . Lipitor [Atorvastatin Calcium]     arthralgias  . Codeine Nausea And  Vomiting  . Irbesartan Other (See Comments)    REACTION: allergic to ARBs w/ angioedema  . Ramipril Other (See Comments)    REACTION: Allergic to ACE's w/ angioedema...    Immunization History  Administered Date(s) Administered  . Influenza Split 02/02/2011, 03/03/2012  . Influenza Whole 02/02/2010  . Influenza, High Dose Seasonal PF 02/20/2013, 01/16/2015, 12/16/2015, 12/22/2016, 01/04/2018  . Influenza, Seasonal, Injecte, Preservative Fre 02/21/2014  . Pneumococcal Conjugate-13 03/05/2014  . Pneumococcal Polysaccharide-23 06/18/2010, 09/03/2015  . Tdap 11/14/2012  . Zoster 09/11/2012  . Zoster Recombinat (Shingrix) 12/22/2016, 03/14/2017    Current Medications, Allergies, Past Medical History, Past Surgical History, Family History, and Social History were reviewed in [employer] record.    Review of Systems       See HPI - all other systems neg except as noted...       The patient complains of dyspnea on exertion and muscle weakness.  The patient denies anorexia, fever, weight loss, weight gain, vision loss, decreased hearing, hoarseness, chest pain, syncope, peripheral edema,  prolonged cough, headaches, hemoptysis, abdominal pain, melena, hematochezia, severe indigestion/heartburn, hematuria, incontinence, suspicious skin lesions, transient blindness, difficulty walking, depression, unusual weight change, abnormal bleeding, enlarged lymph nodes, and angioedema.     Objective:   Physical Exam    WD, sl overweight, 67 y/o WM in NAD... GENERAL:  Alert & oriented; pleasant & cooperative... HEENT:  [place]/AT, EOM-wnl, PERRLA, EACs-clear, TMs-wnl, NOSE-clear, THROAT-s/p UPPP surg... NECK:  Supple w/ fairROM; no JVD; normal carotid impulses w/o bruits; no thyromegaly or nodules palpated; no lymphadenopathy. CHEST:  Decr BS at bases otherw clear, no signs of consolidation... HEART:  Regular Rhythm; without murmurs/ rubs/ or gallops. ABDOMEN:  Soft & nontender; normal bowel sounds; no organomegaly or masses detected. EXT: without deformities, mild arthritic changes; no varicose veins/ venous insuffic/ or edema. Neuro:  intact w/o focal abn detected... DERM:  No lesions noted; no rash etc...  RADIOLOGY DATA:  Reviewed in the EPIC EMR & discussed w/ the patient...   LABORATORY DATA:  Reviewed in the EPIC EMR & discussed w/ the patient...     Assessment & Plan:    Hx OSA, s/p UPPP in 2004>  Prev noted recurrent snoring & using breathe right nasal strips; he didn't want repeat sleep study or further eval, just want Rx for the strips so he can pay for them w/ his flex acct...   Hx recurrent bronchitis, underlying COPD (mixed type), COPD exac>   11/15> He quit smoking in 2004 & c/o recurrent bronchitic exacerbations=> PFT shows mod obstructive dis & we tried DULERA but too $$$ therefore switch to SYMBICORT160-2spBid regularly w/ f/u PFTs later... 6/16> Xolair was denied by insurance despite his IgE level= 1400... 12/16> presented w/ refractory COPD exac; treated w/ ZPak, Medroltaper, Incruse, Mucinex, fluids, Hycodan => eventually improved...  3/17> he was improved and backed  off on mult meds on his own accord... 4/17> recurrent AB symptoms prompted restart of Pred therapy, vigorous antireflux regimen, plus DUONEB Tid, Symbicort & Incruse 5/17> weaning Pred to 10-5 Qod schedule, continue other Rx, refer to DrKozlow for his input... 6/17> DrKozlow has made several adjustments & trying to get Xolair approved => pt couldn't afford Rx. 07/22/16>    [name] appears to have had a viral pneumonia & his serology was pos for FluB; no bacterial etiology established;  This has flared his COPD/ reactive airways component requiring reinstitution of his NEBS Tid, Symbicort160-2spBid, Singulair, and MUCINEX 1200mg  bid w/ fluids;  We  placed him back on a slow Medrol taper for the airway inflammation;  Asked to rest at home w/ fluids but NO SALT & we plan rov recheck in 2-3 weeks. 08/12/16>   [name] is much improved s/p COPD exac likely viral flu mediated AB;  Off Ab/s & finished Medrol- he is asked to remain active, & take his NEB Tid (slowly wean), Symbicort Bid, Singulair, Mucinex, fluids;  As noted we gave him Levaquin, Medrol dosepak, Proair for his upcoming trip to [place]. 12/27/16>   Don is stable on his Symbicort & Singulair, he wants to decr the Symbicort to 2sp once daily- while I'd prefer he continue the Bid dosing, he is adamant about the reduction- reminded to bump back up to 2spBid + NEBS at first sign of any resp problem;  He had the 2018 Flu she by DrPlotnikov & the 1st shingRix shot (c/o low grade temp, musc aches & pains after this vax).   Medical issues per DrPlotnikov>  HBP, CAD s/pMI, VI/ edema, HL, IFG, GERD/ Divertics/ Polyps, GU/ Low-T, DJD/ LBP, Anxiety...    Patient's Medications  New Prescriptions   No medications on file  Previous Medications   ALBUTEROL (PROAIR HFA) 108 (90 BASE) MCG/ACT INHALER    Inhale 1-2 puffs into the lungs every 6 (six) hours as needed for wheezing or shortness of breath.   ALBUTEROL (PROVENTIL) (2.5 MG/3ML) 0.083% NEBULIZER SOLUTION    Use one  vial in the nebulizer every 4-6 hours if needed for cough or wheeze   ALPRAZOLAM (XANAX) 0.5 MG TABLET    TAKE 1 TABLET BY MOUTH THREE TIMES A DAY AS NEEDED   AMMONIUM LACTATE (LAC-HYDRIN) 12 % LOTION    Apply topically as directed.   CARVEDILOL (COREG) 25 MG TABLET    Take 1 tablet (25 mg total) by mouth 2 (two) times daily with a meal.   CETIRIZINE (ZYRTEC) 10 MG TABLET    Take 1 tablet (10 mg total) by mouth daily.   CHOLECALCIFEROL (VITAMIN D3) 5000 UNITS CAPS    Take one tablet by mouth daily   CLOPIDOGREL (PLAVIX) 75 MG TABLET    TAKE 1 TABLET BY MOUTH EVERY DAY   CLOTRIMAZOLE-BETAMETHASONE (LOTRISONE) CREAM    APPLY AS DIRECTED   COENZYME Q10 (COQ10) 100 MG CAPS    Take as directed   CYANOCOBALAMIN 1000 MCG TABLET    Take 1,000 mcg by mouth daily.   FUROSEMIDE (LASIX) 40 MG TABLET    TAKE 1 TABLET BY MOUTH EVERY DAY AS NEEDED   MONTELUKAST (SINGULAIR) 10 MG TABLET    TAKE 1 TABLET BY MOUTH EVERYDAY AT BEDTIME   NITROGLYCERIN (NITROSTAT) 0.4 MG SL TABLET    PLACE 1 TABLET UNDER THE TONGUE EVERY 5 MINUTES AS NEEDED. FOR UP TO 3 DOSES.   PRAVASTATIN (PRAVACHOL) 20 MG TABLET    Take 1 tablet (20 mg total) by mouth daily.   SILDENAFIL (VIAGRA) 100 MG TABLET    TAKE 1 TABLET BY MOUTH AS NEEDED FOR ERECTILE DYSFUNCTION.   TRIAMTERENE-HYDROCHLOROTHIAZIDE (MAXZIDE) 75-50 MG TABLET    Take 1 tablet by mouth daily.   VITAMIN C (ASCORBIC ACID) 500 MG TABLET    Take 500 mg by mouth daily.  Modified Medications   Modified Medication Previous Medication   BUDESONIDE-FORMOTEROL (SYMBICORT) 160-4.5 MCG/ACT INHALER budesonide-formoterol (SYMBICORT) 160-4.5 MCG/ACT inhaler      INHALE 2 PUFFS INTO THE LUNGS 2 (TWO) TIMES DAILY.    INHALE 2 PUFFS INTO THE LUNGS 2 (TWO) TIMES DAILY.  BUPROPION (WELLBUTRIN SR) 150 MG 12 HR TABLET buPROPion (WELLBUTRIN SR) 150 MG 12 hr tablet      Take 1 tablet (150 mg total) by mouth 2 (two) times daily.    TAKE 1 TABLET BY MOUTH TWICE A DAY   PANTOPRAZOLE (PROTONIX) 40 MG  TABLET pantoprazole (PROTONIX) 40 MG tablet      Take 1 tablet (40 mg total) by mouth daily.    Take 1 tablet (40 mg total) by mouth daily.  Discontinued Medications   No medications on file

## 2018-03-08 ENCOUNTER — Telehealth: Payer: Self-pay | Admitting: Internal Medicine

## 2018-03-08 NOTE — Telephone Encounter (Signed)
Copied from [place] [id number]. Topic: Quick Communication - See Telephone Encounter >> Mar 08, 2018 12:14 PM Antonieta Iba C wrote: CRM for notification. See Telephone encounter for: 03/08/18.  Pt called in to be advised. Pt says that his grandson was Dx with Meningitis. Pt says that he is around his grandson a lot and would like to know if he should/could have the meningitis vac? Pt says that he will do what ever the provider suggest/advise him to do  CB: [phone]

## 2018-03-08 NOTE — Telephone Encounter (Signed)
Please advise 

## 2018-03-08 NOTE — Telephone Encounter (Signed)
What bacteria caused it? Is it a meningococcal meningitis? We can give a vaccine if he wishes. Larry Stone should ask his grandson's doctors what he should do. Hope he is doing ok! Thx

## 2018-03-09 NOTE — Telephone Encounter (Signed)
Pt would like to know if he should get the vaccine after being around the grandson or if he should do any testing first.  Please advise

## 2018-03-09 NOTE — Telephone Encounter (Signed)
Ok to get a vaccine Thx

## 2018-03-10 NOTE — Telephone Encounter (Signed)
LM notifying pt to call back and schedule

## 2018-04-03 ENCOUNTER — Ambulatory Visit (HOSPITAL_COMMUNITY)
Admission: EM | Admit: 2018-04-03 | Discharge: 2018-04-03 | Disposition: A | Discharge status: Home / Self Care | Payer: Medicare Other | Attending: Family Medicine | Admitting: Family Medicine

## 2018-04-03 ENCOUNTER — Ambulatory Visit: Payer: Self-pay

## 2018-04-03 ENCOUNTER — Ambulatory Visit (INDEPENDENT_AMBULATORY_CARE_PROVIDER_SITE_OTHER): Payer: Medicare Other

## 2018-04-03 ENCOUNTER — Encounter (HOSPITAL_COMMUNITY): Payer: Self-pay | Admitting: Emergency Medicine

## 2018-04-03 DIAGNOSIS — W19XXXA Unspecified fall, initial encounter: Secondary | ICD-10-CM | POA: Diagnosis not present

## 2018-04-03 DIAGNOSIS — I25119 Atherosclerotic heart disease of native coronary artery with unspecified angina pectoris: Secondary | ICD-10-CM

## 2018-04-03 DIAGNOSIS — R0781 Pleurodynia: Secondary | ICD-10-CM

## 2018-04-03 DIAGNOSIS — S20212A Contusion of left front wall of thorax, initial encounter: Secondary | ICD-10-CM | POA: Diagnosis not present

## 2018-04-03 DIAGNOSIS — J449 Chronic obstructive pulmonary disease, unspecified: Secondary | ICD-10-CM

## 2018-04-03 DIAGNOSIS — I509 Heart failure, unspecified: Secondary | ICD-10-CM

## 2018-04-03 DIAGNOSIS — R0782 Intercostal pain: Secondary | ICD-10-CM

## 2018-04-03 DIAGNOSIS — I5020 Unspecified systolic (congestive) heart failure: Secondary | ICD-10-CM

## 2018-04-03 DIAGNOSIS — W0110XA Fall on same level from slipping, tripping and stumbling with subsequent striking against unspecified object, initial encounter: Secondary | ICD-10-CM

## 2018-04-03 MED ORDER — CYCLOBENZAPRINE HCL 7.5 MG PO TABS: 7.5000 mg | ORAL_TABLET | Freq: Every evening | ORAL | 0 refills | Status: DC | PRN

## 2018-04-03 MED ORDER — TRAMADOL-ACETAMINOPHEN 37.5-325 MG PO TABS: 1.0000 | ORAL_TABLET | Freq: Four times a day (QID) | ORAL | 0 refills | Status: DC | PRN

## 2018-04-03 NOTE — ED Provider Notes (Addendum)
MRN: [id number] DOB: [birth year]  Subjective:   Larry Stone is a 67 y.o. male presenting for acute onset of left sided upper chest pain, pain under left scapula. Patient slipped and fell onto tile flooring, landed akwardly with his left arm tucked under him. Has uncomfortable breathing due to his pain.  Patient has a history of coronary artery disease, heart failure, COPD.  Denies bruising, bony deformity, shortness of breath, wheezing.   Current Facility-Administered Medications:  .  0.9 %  sodium chloride infusion, 500 mL, Intravenous, Continuous, Danis, Estill Cotta III, MD  Current Outpatient Medications:  .  albuterol (PROAIR HFA) 108 (90 Base) MCG/ACT inhaler, Inhale 1-2 puffs into the lungs every 6 (six) hours as needed for wheezing or shortness of breath., Disp: 1 Inhaler, Rfl: 11 .  albuterol (PROVENTIL) (2.5 MG/3ML) 0.083% nebulizer solution, Use one vial in the nebulizer every 4-6 hours if needed for cough or wheeze, Disp: 225 mL, Rfl: 11 .  ALPRAZolam (XANAX) 0.5 MG tablet, TAKE 1 TABLET BY MOUTH THREE TIMES A DAY AS NEEDED, Disp: 90 tablet, Rfl: 3 .  ammonium lactate (LAC-HYDRIN) 12 % lotion, Apply topically as directed., Disp: 400 g, Rfl: 3 .  budesonide-formoterol (SYMBICORT) 160-4.5 MCG/ACT inhaler, INHALE 2 PUFFS INTO THE LUNGS 2 (TWO) TIMES DAILY., Disp: 3 Inhaler, Rfl: 3 .  buPROPion (WELLBUTRIN SR) 150 MG 12 hr tablet, Take 1 tablet (150 mg total) by mouth 2 (two) times daily., Disp: 180 tablet, Rfl: 3 .  carvedilol (COREG) 25 MG tablet, Take 1 tablet (25 mg total) by mouth 2 (two) times daily with a meal., Disp: 180 tablet, Rfl: 3 .  cetirizine (ZYRTEC) 10 MG tablet, Take 1 tablet (10 mg total) by mouth daily., Disp: 30 tablet, Rfl: 11 .  Cholecalciferol (VITAMIN D3) 5000 UNITS CAPS, Take one tablet by mouth daily, Disp: , Rfl:  .  clopidogrel (PLAVIX) 75 MG tablet, TAKE 1 TABLET BY MOUTH EVERY DAY, Disp: 90 tablet, Rfl: 2 .  clotrimazole-betamethasone (LOTRISONE) cream, APPLY  AS DIRECTED (Patient taking differently: Apply 1 application topically 2 (two) times daily as needed. APPLY AS DIRECTED), Disp: 45 g, Rfl: 11 .  Coenzyme Q10 (COQ10) 100 MG CAPS, Take as directed (Patient taking differently: Take 1 capsule by mouth daily. Take as directed), Disp: 30 each, Rfl: 11 .  cyanocobalamin 1000 MCG tablet, Take 1,000 mcg by mouth daily., Disp: , Rfl:  .  furosemide (LASIX) 40 MG tablet, TAKE 1 TABLET BY MOUTH EVERY DAY AS NEEDED, Disp: 90 tablet, Rfl: 3 .  montelukast (SINGULAIR) 10 MG tablet, TAKE 1 TABLET BY MOUTH EVERYDAY AT BEDTIME, Disp: 90 tablet, Rfl: 4 .  nitroGLYCERIN (NITROSTAT) 0.4 MG SL tablet, PLACE 1 TABLET UNDER THE TONGUE EVERY 5 MINUTES AS NEEDED. FOR UP TO 3 DOSES., Disp: 25 tablet, Rfl: 5 .  pantoprazole (PROTONIX) 40 MG tablet, Take 1 tablet (40 mg total) by mouth daily., Disp: 90 tablet, Rfl: 3 .  pravastatin (PRAVACHOL) 20 MG tablet, Take 1 tablet (20 mg total) by mouth daily., Disp: 90 tablet, Rfl: 3 .  sildenafil (VIAGRA) 100 MG tablet, TAKE 1 TABLET BY MOUTH AS NEEDED FOR ERECTILE DYSFUNCTION., Disp: 30 tablet, Rfl: 3 .  triamterene-hydrochlorothiazide (MAXZIDE) 75-50 MG tablet, Take 1 tablet by mouth daily., Disp: 90 tablet, Rfl: 3 .  vitamin C (ASCORBIC ACID) 500 MG tablet, Take 500 mg by mouth daily., Disp: , Rfl:     Allergies  Allergen Reactions  . Ace Inhibitors Swelling  . Angiotensin Receptor Blockers  Swelling  . Aspirin Hives  . Lipitor [Atorvastatin Calcium]     arthralgias  . Codeine Nausea And Vomiting  . Irbesartan Other (See Comments)    REACTION: allergic to ARBs w/ angioedema  . Ramipril Other (See Comments)    REACTION: Allergic to ACE's w/ angioedema...    Past Medical History:  Diagnosis Date  . Anxiety   . CAD    a. acute inferior lateral wall infarction in September 2004 treated medically. b.  cath 06/17/16 showing mild nonobstructive CAD with 30-40% ostial LM (eccentric), elevated LV filling pressures and normal LV  function.  . Chronic diastolic CHF (congestive heart failure) ([place])   . COPD (chronic obstructive pulmonary disease) ([place])   . Diabetes mellitus   . Diverticulosis   . DJD (degenerative joint disease)   . GERD (gastroesophageal reflux disease)   . HTN (hypertension)   . Hyperlipidemia   . Low back pain syndrome   . Myocardial infarction ([place]) 2004  . Obesity   . OSA (obstructive sleep apnea)   . Recurrent aspiration bronchitis/pneumonia ([place])   . Recurrent aspiration pneumonia ([place])    [name] 07/13/2016  . Thrombocytopenia ([place])   . Tubular adenoma of colon 2014     Past Surgical History:  Procedure Laterality Date  . CARDIAC CATHETERIZATION    . LEFT HEART CATH AND CORONARY ANGIOGRAPHY N/A 06/17/2016   Procedure: Left Heart Cath and Coronary Angiography;  Surgeon: Burnell Blanks, MD;  Location: [facility];  Service: Cardiovascular;  Laterality: N/A;  . UVULOPALATOPHARYNGOPLASTY     surgery for OSA    Objective:   Vitals: BP 128/71   Pulse 64   Temp 98 F (36.7 C)   Resp 18   SpO2 97%   Physical Exam Constitutional:      General: He is in acute distress (From his rib pain).     Appearance: He is well-developed. He is obese. He is not ill-appearing, toxic-appearing or diaphoretic.  HENT:     Mouth/Throat:     Mouth: Mucous membranes are moist.     Pharynx: Oropharynx is clear.  Eyes:     General: No scleral icterus.    Extraocular Movements: Extraocular movements intact.     Pupils: Pupils are equal, round, and reactive to light.  Cardiovascular:     Rate and Rhythm: Normal rate and regular rhythm.     Heart sounds: No murmur. No friction rub. No gallop.   Pulmonary:     Effort: No respiratory distress.     Breath sounds: No stridor. No wheezing, rhonchi or rales.  Musculoskeletal:     Thoracic back: He exhibits tenderness (Over area depicted).       Back:  Neurological:     Mental Status: He is alert and oriented to person, place, and time.   Psychiatric:        Mood and Affect: Mood normal.        Behavior: Behavior normal.        Thought Content: Thought content normal.     Dg Chest 2 View  Result Date: 04/03/2018 CLINICAL DATA:  67 y/o M; fall landing onto the left upper arm with pain to the mid left posterior ribs. EXAM: CHEST - 2 VIEW COMPARISON:  08/20/2016 chest radiograph FINDINGS: Stable heart size and mediastinal contours are within normal limits. Both lungs are clear. No acute fracture identified. IMPRESSION: No active cardiopulmonary disease. No acute fracture identified. If clinically indicated, consider dedicated rib radiographs. Electronically Signed  By: Kristine Garbe M.D.   On: 04/03/2018 14:14    Assessment and Plan :   Rib contusion, left, initial encounter  Rib pain on left side  Fall, initial encounter  Coronary artery disease with angina pectoris, unspecified vessel or lesion type, unspecified whether native or transplanted heart (HCC)  Chronic obstructive pulmonary disease, unspecified COPD type ([place])  Congestive heart failure, unspecified HF chronicity, unspecified heart failure type ([place])  Will manage conservatively for rib contusion with Ultracet and Flexeril.  Patient is not a good candidate for NSAIDs given his heart history.  Counseled on importance of trying to maintain his breathing and avoidance of using Ace wraps for his chest.  ER and return to clinic precautions reviewed.     Jaynee Eagles, PA-C 04/03/18 1538    Jaynee Eagles, PA-C 04/03/18 1539

## 2018-04-03 NOTE — Telephone Encounter (Signed)
Pt called and reported that he tripped and fell today hitting his ribs. He states he believes that he has broken some rib on his left upper side. He states the pain is severe when he bends. He denies hitting his head He tripped and did not get weak or pass out. He has called some orthopedic office and they stated that they" do not do ribs". He feels that the pain is going to be to much to bare.  He states the fall took his breath away. He is breathing well. He is able to take a deep breath but it is painful. Appointment scheduled per protocol. Care advice read to patient. Pt verbalized understanding of all instructions.  Reason for Disposition . Large swelling or bruise > 2 inches (5 cm)  Answer Assessment - Initial Assessment Questions 1. MECHANISM: "How did the injury happen?"     fall 2. ONSET: "When did the injury happen?" (Minutes or hours ago)     Left side 3. LOCATION: "Where on the chest is the injury located?"     Ribs upper 4. APPEARANCE: "What does the injury look like?"     nothing 5. BLEEDING: "Is there any bleeding now? If so, ask: How long has it been bleeding?"     no 6. SEVERITY: "Any difficulty with breathing?"     Breathing is fine 7. SIZE: For cuts, bruises, or swelling, ask: "How large is it?" (e.g., inches or centimeters)     N/A 8. PAIN: "Is there pain?" If so, ask: "How bad is the pain?"   (e.g., Scale 1-10; or mild, moderate, severe)     9 when bending 9. TETANUS: For any breaks in the skin, ask: "When was the last tetanus booster?"     N/A 10. PREGNANCY: "Is there any chance you are pregnant?" "When was your last menstrual period?"     N/A  Protocols used: CHEST INJURY-A-AH

## 2018-04-03 NOTE — ED Triage Notes (Signed)
Pt states today he slipped on some plastic flooring and landed on his L rib cage area. C/o rib tenderness. Denies hitting head or LOC.

## 2018-04-04 ENCOUNTER — Ambulatory Visit: Payer: Medicare Other | Admitting: Family

## 2018-04-04 ENCOUNTER — Ambulatory Visit: Payer: Medicare Other | Admitting: Internal Medicine

## 2018-06-07 ENCOUNTER — Ambulatory Visit: Payer: Medicare Other | Admitting: Internal Medicine

## 2018-06-07 ENCOUNTER — Encounter: Payer: Self-pay | Admitting: Internal Medicine

## 2018-06-07 VITALS — BP 124/68 | HR 63 | Temp 97.9°F | Ht 69.0 in | Wt 213.0 lb

## 2018-06-07 DIAGNOSIS — F411 Generalized anxiety disorder: Secondary | ICD-10-CM | POA: Diagnosis not present

## 2018-06-07 DIAGNOSIS — E785 Hyperlipidemia, unspecified: Secondary | ICD-10-CM

## 2018-06-07 DIAGNOSIS — Z0000 Encounter for general adult medical examination without abnormal findings: Secondary | ICD-10-CM | POA: Diagnosis not present

## 2018-06-07 DIAGNOSIS — N320 Bladder-neck obstruction: Secondary | ICD-10-CM | POA: Diagnosis not present

## 2018-06-07 NOTE — Progress Notes (Signed)
Subjective:  Patient ID: [name], male    DOB: 01-30-1951  Age: 68 y.o. MRN: [id number]  CC: No chief complaint on file.   HPI GAETAN SPIEKER presents for dyslipidemia- on Pravachol (knee pain has resolved) F/u edema - resolved off Norvasc F/u arthralgia - better  Outpatient Medications Prior to Visit  Medication Sig Dispense Refill  . albuterol (PROAIR HFA) 108 (90 Base) MCG/ACT inhaler Inhale 1-2 puffs into the lungs every 6 (six) hours as needed for wheezing or shortness of breath. 1 Inhaler 11  . albuterol (PROVENTIL) (2.5 MG/3ML) 0.083% nebulizer solution Use one vial in the nebulizer every 4-6 hours if needed for cough or wheeze 225 mL 11  . ALPRAZolam (XANAX) 0.5 MG tablet TAKE 1 TABLET BY MOUTH THREE TIMES A DAY AS NEEDED 90 tablet 3  . ammonium lactate (LAC-HYDRIN) 12 % lotion Apply topically as directed. 400 g 3  . budesonide-formoterol (SYMBICORT) 160-4.5 MCG/ACT inhaler INHALE 2 PUFFS INTO THE LUNGS 2 (TWO) TIMES DAILY. 3 Inhaler 3  . buPROPion (WELLBUTRIN SR) 150 MG 12 hr tablet Take 1 tablet (150 mg total) by mouth 2 (two) times daily. 180 tablet 3  . carvedilol (COREG) 25 MG tablet Take 1 tablet (25 mg total) by mouth 2 (two) times daily with a meal. 180 tablet 3  . cetirizine (ZYRTEC) 10 MG tablet Take 1 tablet (10 mg total) by mouth daily. 30 tablet 11  . Cholecalciferol (VITAMIN D3) 5000 UNITS CAPS Take one tablet by mouth daily    . clopidogrel (PLAVIX) 75 MG tablet TAKE 1 TABLET BY MOUTH EVERY DAY 90 tablet 2  . clotrimazole-betamethasone (LOTRISONE) cream APPLY AS DIRECTED (Patient taking differently: Apply 1 application topically 2 (two) times daily as needed. APPLY AS DIRECTED) 45 g 11  . Coenzyme Q10 (COQ10) 100 MG CAPS Take as directed (Patient taking differently: Take 1 capsule by mouth daily. Take as directed) 30 each 11  . cyanocobalamin 1000 MCG tablet Take 1,000 mcg by mouth daily.    . montelukast (SINGULAIR) 10 MG tablet TAKE 1 TABLET BY MOUTH  EVERYDAY AT BEDTIME 90 tablet 4  . nitroGLYCERIN (NITROSTAT) 0.4 MG SL tablet PLACE 1 TABLET UNDER THE TONGUE EVERY 5 MINUTES AS NEEDED. FOR UP TO 3 DOSES. 25 tablet 5  . pantoprazole (PROTONIX) 40 MG tablet Take 1 tablet (40 mg total) by mouth daily. 90 tablet 3  . pravastatin (PRAVACHOL) 20 MG tablet Take 1 tablet (20 mg total) by mouth daily. 90 tablet 3  . sildenafil (VIAGRA) 100 MG tablet TAKE 1 TABLET BY MOUTH AS NEEDED FOR ERECTILE DYSFUNCTION. 30 tablet 3  . triamterene-hydrochlorothiazide (MAXZIDE) 75-50 MG tablet Take 1 tablet by mouth daily. 90 tablet 3  . vitamin C (ASCORBIC ACID) 500 MG tablet Take 500 mg by mouth daily.    . cyclobenzaprine (FEXMID) 7.5 MG tablet Take 1 tablet (7.5 mg total) by mouth at bedtime as needed for muscle spasms. (Patient not taking: Reported on 06/07/2018) 30 tablet 0  . furosemide (LASIX) 40 MG tablet TAKE 1 TABLET BY MOUTH EVERY DAY AS NEEDED (Patient not taking: Reported on 06/07/2018) 90 tablet 3  . traMADol-acetaminophen (ULTRACET) 37.5-325 MG tablet Take 1 tablet by mouth every 6 (six) hours as needed for severe pain. (Patient not taking: Reported on 06/07/2018) 30 tablet 0   Facility-Administered Medications Prior to Visit  Medication Dose Route Frequency Provider Last Rate Last Dose  . 0.9 %  sodium chloride infusion  500 mL Intravenous Continuous Danis,  Estill Cotta III, MD        ROS: Review of Systems  Constitutional: Negative for appetite change, fatigue and unexpected weight change.  HENT: Negative for congestion, nosebleeds, sneezing, sore throat and trouble swallowing.   Eyes: Negative for itching and visual disturbance.  Respiratory: Negative for cough.   Cardiovascular: Negative for chest pain, palpitations and leg swelling.  Gastrointestinal: Negative for abdominal distention, blood in stool, diarrhea and nausea.  Genitourinary: Negative for frequency and hematuria.  Musculoskeletal: Positive for arthralgias. Negative for back pain, gait  problem, joint swelling and neck pain.  Skin: Negative for rash.  Neurological: Negative for dizziness, tremors, speech difficulty and weakness.  Psychiatric/Behavioral: Negative for agitation, dysphoric mood and sleep disturbance. The patient is not nervous/anxious.     Objective:  BP 124/68 (BP Location: Left Arm, Patient Position: Sitting, Cuff Size: Large)   Pulse 63   Temp 97.9 F (36.6 C) (Oral)   Ht 5\' 9"  (1.753 m)   Wt 213 lb (96.6 kg)   SpO2 95%   BMI 31.45 kg/m   BP Readings from Last 3 Encounters:  06/07/18 124/68  04/03/18 128/71  01/31/18 126/78    Wt Readings from Last 3 Encounters:  06/07/18 213 lb (96.6 kg)  01/31/18 207 lb 6.4 oz (94.1 kg)  01/17/18 209 lb (94.8 kg)    Physical Exam Constitutional:      General: He is not in acute distress.    Appearance: He is well-developed.     Comments: NAD  Eyes:     Conjunctiva/sclera: Conjunctivae normal.     Pupils: Pupils are equal, round, and reactive to light.  Neck:     Musculoskeletal: Normal range of motion.     Thyroid: No thyromegaly.     Vascular: No JVD.  Cardiovascular:     Rate and Rhythm: Normal rate and regular rhythm.     Heart sounds: Normal heart sounds. No murmur. No friction rub. No gallop.   Pulmonary:     Effort: Pulmonary effort is normal. No respiratory distress.     Breath sounds: Normal breath sounds. No wheezing or rales.  Chest:     Chest wall: No tenderness.  Abdominal:     General: Bowel sounds are normal. There is no distension.     Palpations: Abdomen is soft. There is no mass.     Tenderness: There is no abdominal tenderness. There is no guarding or rebound.  Musculoskeletal: Normal range of motion.        General: No tenderness.  Lymphadenopathy:     Cervical: No cervical adenopathy.  Skin:    General: Skin is warm and dry.     Findings: No rash.  Neurological:     Mental Status: He is alert and oriented to person, place, and time.     Cranial Nerves: No cranial  nerve deficit.     Motor: No abnormal muscle tone.     Coordination: Coordination normal.     Gait: Gait normal.     Deep Tendon Reflexes: Reflexes are normal and symmetric.  Psychiatric:        Behavior: Behavior normal.        Thought Content: Thought content normal.        Judgment: Judgment normal.     Lab Results  Component Value Date   WBC 8.4 07/12/2017   HGB 14.8 07/12/2017   HCT 42.6 07/12/2017   PLT 171.0 07/12/2017   GLUCOSE 106 (H) 01/17/2018   CHOL 202 (H)  01/17/2018   TRIG 75.0 01/17/2018   HDL 42.90 01/17/2018   LDLCALC 144 (H) 01/17/2018   ALT 32 01/17/2018   AST 23 01/17/2018   NA 140 01/17/2018   K 4.4 01/17/2018   CL 103 01/17/2018   CREATININE 1.20 01/17/2018   BUN 20 01/17/2018   CO2 31 01/17/2018   TSH 1.95 07/12/2017   PSA 0.48 07/12/2017   INR 1.0 06/09/2016   HGBA1C 6.0 01/17/2018    Dg Chest 2 View  Result Date: 04/03/2018 CLINICAL DATA:  68 y/o M; fall landing onto the left upper arm with pain to the mid left posterior ribs. EXAM: CHEST - 2 VIEW COMPARISON:  08/20/2016 chest radiograph FINDINGS: Stable heart size and mediastinal contours are within normal limits. Both lungs are clear. No acute fracture identified. IMPRESSION: No active cardiopulmonary disease. No acute fracture identified. If clinically indicated, consider dedicated rib radiographs. Electronically Signed   By: Kristine Garbe M.D.   On: 04/03/2018 14:14    Assessment & Plan:   There are no diagnoses linked to this encounter.   No orders of the defined types were placed in this encounter.    Follow-up: No follow-ups on file.  [name], MD

## 2018-06-21 ENCOUNTER — Other Ambulatory Visit: Payer: Self-pay | Admitting: Cardiovascular Disease

## 2018-06-21 ENCOUNTER — Other Ambulatory Visit: Payer: Self-pay | Admitting: Internal Medicine

## 2018-07-03 ENCOUNTER — Telehealth: Payer: Self-pay | Admitting: Cardiovascular Disease

## 2018-07-03 NOTE — Telephone Encounter (Signed)
New Message     *STAT* If patient is at the pharmacy, call can be transferred to refill team.   1. Which medications need to be refilled? (please list name of each medication and dose if known) Plavix  2. Which pharmacy/location (including street and city if local pharmacy) is medication to be sent to? [facility] [address]   3. Do they need a 30 day or 90 day supply? 30   Pt said he only got a 30 day supply but it is more expensive to do it that way and he is wanting it to be for 90 days  Please call

## 2018-07-20 ENCOUNTER — Other Ambulatory Visit: Payer: Self-pay

## 2018-07-20 ENCOUNTER — Telehealth: Payer: Self-pay | Admitting: Cardiovascular Disease

## 2018-07-20 MED ORDER — CLOPIDOGREL BISULFATE 75 MG PO TABS: 75.0000 mg | ORAL_TABLET | Freq: Every day | ORAL | 0 refills | Status: DC

## 2018-07-20 NOTE — Telephone Encounter (Signed)
°*  STAT* If patient is at the pharmacy, call can be transferred to refill team.   1. Which medications need to be refilled? (please list name of each medication and dose if known) clopidogrel (PLAVIX) 75 MG tablet  2. Which pharmacy/location (including street and city if local pharmacy) is medication to be sent to?[id number] [place], [place] - [address]  3. Do they need a 30 day or 90 day supply?90 days  Patient has appt on 5/11.  Only has 3 days left.

## 2018-07-30 ENCOUNTER — Other Ambulatory Visit: Payer: Self-pay | Admitting: Cardiovascular Disease

## 2018-08-27 ENCOUNTER — Encounter: Payer: Self-pay | Admitting: Physician Assistant

## 2018-08-27 NOTE — Progress Notes (Signed)
Virtual Visit via Telephone Note   This visit type was conducted due to national recommendations for restrictions regarding the COVID-19 Pandemic (e.g. social distancing) in an effort to limit this patient's exposure and mitigate transmission in our community.  Due to his co-morbid illnesses, this patient is at least at moderate risk for complications without adequate follow up.  This format is felt to be most appropriate for this patient at this time.  The patient did not have access to video technology/had technical difficulties with video requiring transitioning to audio format only (telephone).  All issues noted in this document were discussed and addressed.  No physical exam could be performed with this format.  Patient consented to telehealth visit for [facility]. 2 identifiers were used to ensure correct patient.  Date:  08/28/2018   ID:  [name], DOB [birth year], MRN [id number]  Patient Location: Home Provider Location: Home  PCP:  Cassandria Anger, MD  Cardiologist:  Lauree Chandler, MD  Electrophysiologist:  None   Evaluation Performed:  Follow-Up Visit  Chief Complaint:  F/u CAD  History of Present Illness:    CY BRESEE is a 68 y.o. 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 He has a remote history of acute inf-lat MI in 2004 while in , treated at [facility]. Per Dr. Camillia Herter note he had a 50% LM and nonobstructive disease otherwise. There is an old discharge summary from 2004 from [facility] indicating possible occlusion of the circumflex at time of MI and possible angioplasty but this is not clear. Cardiac CT in 05/2009 with LM < 50% LAD 50% LCX dominant nonobstructive RCA small no critical lesions with small lung nodule. F/u CT showed stable pulm nodule. Carotid u/s 11/11 showed minimal carotid plaque. He underwent repeat  cath 06/17/16 showing mild nonobstructive CAD with 30-40% ostial LM (eccentric), elevated LV filling pressures and normal LV function. He was started on Lasix for his elevated filling pressures. In 06/2016 he was admitted with sepsis, acute respiratory failure with hypoxia, multilobar PNA, and influenza B. He previously had to lower his dose of Norvasc due to LE edema. Last labs 01/2018 showed LDL 144 (very high compared to prior), normal LFTs, K 4.4, Cr 1.20 (c/w prior), A1C 6.0. He has h/o myalgias with Lipitor and Crestor, so has been on Pravastatin.  He is seen today virtually for cardiac follow-up. He denies any symptoms concerning for angina. He has not had any CP, SOB, palpitations, dizziness, syncope. His edema is better since changing Norvasc to Maxzide but still notices it from time to time. He tries to limit salt. BP still runs slightly above goal at times, 267-124 systolic. The patient does not have symptoms concerning for COVID-19 infection (fever, chills, cough, or new shortness of breath)   Past Medical History:  Diagnosis Date   Anxiety    CAD    a. acute inferior lateral wall infarction in September 2004 treated medically. b.  cath 06/17/16 showing mild nonobstructive CAD with 30-40% ostial LM (eccentric), elevated LV filling pressures and normal LV function.   Chronic diastolic CHF (congestive heart failure) (HCC)    CKD (chronic kidney disease), stage II    COPD (chronic obstructive pulmonary disease) (HCC)    Diabetes mellitus    Diverticulosis    DJD (degenerative joint disease)    GERD (gastroesophageal reflux disease)    HTN (hypertension)  Hyperlipidemia    Low back pain syndrome    Myocardial infarction [facility]) 2004   Obesity    OSA (obstructive sleep apnea)    RBBB    Recurrent aspiration bronchitis/pneumonia (HCC)    Recurrent aspiration pneumonia ([place])    [name] 07/13/2016   Thrombocytopenia (HCC)    Tubular adenoma of colon 2014   Past Surgical  History:  Procedure Laterality Date   CARDIAC CATHETERIZATION     LEFT HEART CATH AND CORONARY ANGIOGRAPHY N/A 06/17/2016   Procedure: Left Heart Cath and Coronary Angiography;  Surgeon: Burnell Blanks, MD;  Location: [place] [facility];  Service: Cardiovascular;  Laterality: N/A;   UVULOPALATOPHARYNGOPLASTY     surgery for OSA     Current Meds  Medication Sig   albuterol (PROAIR HFA) 108 (90 Base) MCG/ACT inhaler Inhale 1-2 puffs into the lungs every 6 (six) hours as needed for wheezing or shortness of breath.   albuterol (PROVENTIL) (2.5 MG/3ML) 0.083% nebulizer solution Use one vial in the nebulizer every 4-6 hours if needed for cough or wheeze   ALPRAZolam (XANAX) 0.5 MG tablet TAKE 1 TABLET BY MOUTH THREE TIMES A DAY AS NEEDED   ammonium lactate (LAC-HYDRIN) 12 % lotion Apply topically as directed.   budesonide-formoterol (SYMBICORT) 160-4.5 MCG/ACT inhaler INHALE 2 PUFFS INTO THE LUNGS 2 (TWO) TIMES DAILY.   buPROPion (WELLBUTRIN SR) 150 MG 12 hr tablet Take 1 tablet (150 mg total) by mouth 2 (two) times daily.   carvedilol (COREG) 25 MG tablet Take 1 tablet (25 mg total) by mouth 2 (two) times daily with a meal.   cetirizine (ZYRTEC) 10 MG tablet Take 1 tablet (10 mg total) by mouth daily.   Cholecalciferol (VITAMIN D3) 5000 UNITS CAPS Take one tablet by mouth daily   clopidogrel (PLAVIX) 75 MG tablet TAKE 1 TABLET BY MOUTH DAILY. PT MUST CALL AND MAKE APPT TO RECEIVE FUTURE REFILLS   clotrimazole-betamethasone (LOTRISONE) cream APPLY AS DIRECTED   Coenzyme Q10 (COQ10) 100 MG CAPS Take as directed   cyanocobalamin 1000 MCG tablet Take 1,000 mcg by mouth daily.   montelukast (SINGULAIR) 10 MG tablet TAKE 1 TABLET BY MOUTH EVERYDAY AT BEDTIME   nitroGLYCERIN (NITROSTAT) 0.4 MG SL tablet PLACE 1 TABLET UNDER THE TONGUE EVERY 5 MINUTES AS NEEDED. FOR UP TO 3 DOSES.   pantoprazole (PROTONIX) 40 MG tablet Take 1 tablet (40 mg total) by mouth daily.   pravastatin  (PRAVACHOL) 20 MG tablet Take 1 tablet (20 mg total) by mouth daily.   sildenafil (VIAGRA) 100 MG tablet TAKE 1 TABLET BY MOUTH AS NEEDED FOR ERECTILE DYSFUNCTION.   triamterene-hydrochlorothiazide (MAXZIDE) 75-50 MG tablet Take 1 tablet by mouth daily.   vitamin C (ASCORBIC ACID) 500 MG tablet Take 500 mg by mouth daily.   Allergies:   Ace inhibitors; Angiotensin receptor blockers; Aspirin; Amlodipine; Crestor [rosuvastatin calcium]; Lipitor [atorvastatin calcium]; Codeine; Irbesartan; and Ramipril   Social History   Tobacco Use   Smoking status: Former Smoker    Packs/day: 1.00    Years: 20.00    Pack years: 20.00    Types: Cigarettes, Cigars    Last attempt to quit: 05/08/1998    Years since quitting: 20.3   Smokeless tobacco: Never Used   Tobacco comment: quit in 2005  Substance Use Topics   Alcohol use: Yes    Alcohol/week: 1.0 standard drinks    Types: 1 Standard drinks or equivalent per week    Comment: social drinker   Drug use: No  Family Hx: The patient's family history includes Colon cancer in his father and paternal uncle; Diabetes in his sister; Heart disease in his mother; Hypertension in his sister; Stroke in his mother.  ROS:   Please see the history of present illness.    All other systems reviewed and are negative.   Prior CV studies:   Most recent pertinent cardiac studies are outlined above.   Labs/Other Tests and Data Reviewed:    EKG:  An ECG dated 08/26/17 was personally reviewed today and demonstrated:  sinus bradycardia 57bpm RBBB, possible prior anterolateral infarct with nonspecific changes    Recent Labs: 01/17/2018: ALT 32; BUN 20; Creatinine, Ser 1.20; Potassium 4.4; Sodium 140   Recent Lipid Panel Lab Results  Component Value Date/Time   CHOL 202 (H) 01/17/2018 09:49 AM   TRIG 75.0 01/17/2018 09:49 AM   HDL 42.90 01/17/2018 09:49 AM   CHOLHDL 5 01/17/2018 09:49 AM   LDLCALC 144 (H) 01/17/2018 09:49 AM    Wt Readings from  Last 3 Encounters:  08/28/18 209 lb (94.8 kg)  06/07/18 213 lb (96.6 kg)  01/31/18 207 lb 6.4 oz (94.1 kg)     Objective:    Vital Signs:  BP 140/70    Ht 5\' 9"  (1.753 m)    Wt 209 lb (94.8 kg)    BMI 30.86 kg/m    VITAL SIGNS:  reviewed  General - male in no acute distress Pulm - No labored breathing, no coughing during visit, no audible wheezing, speaking in full sentences Neuro - A+Ox3, no slurred speech, answers questions appropriately Psych - Pleasant affect   ASSESSMENT & PLAN:    1. CAD - doing well without symptoms of angina. Continue Plavix (on monotherapy per Dr. Angelena Form), BB. See below re: BP and cholesterol. 2. Essential HTN - BP mildly elevated. Not a candidate for ACEI/ARB due to allergy. Amlodipine less ideal with h/o swelling. I considered spironolactone but he does have a hx of elevated K so would hold off, especially in desired setting to avoid need for additional lab exposure during Covid pandemic. Will trial hydralazine 25mg  TID. He will follow BP over next few days after initiation and was instructed to send in MyChart readings for our review. 3. Hyperlipidemia - LDL way too high by last labs. He will contact his PCP to discuss plan for repeating labs (they were due in 05/2018). If LDL is >70, I asked him to notify our office at which time he would benefit from pharmD consultation to discuss PCSK9 candidacy. He has trialed Lipitor, Crestor and Pravastatin. Knee pain is better on Pravastatin but may prohibit dose titration if LDL remains suboptimal. 4. Chronic diastolic CHF - weight is down 4lb from prior. He reports a general 2-3lb fluctuation but no wild changes. He will give an update on swelling when he relays his BP readings. He is not currently using Lasix PRN anymore. Also advised reduction in sodium, elevation and trial of compression hose. He does not drink excess fluid. 5. RBBB - f/u by EKG in 12/2018. No symptoms to suggest bradycardia.  COVID-19 Education: The  signs and symptoms of COVID-19 were discussed with the patient and how to seek care for testing (follow up with PCP or arrange E-visit). The importance of social distancing was discussed today.  Time:   Today, I have spent 17 minutes with the patient with telehealth technology discussing the above problems.     Medication Adjustments/Labs and Tests Ordered: Current medicines are reviewed at length  with the patient today.  Concerns regarding medicines are outlined above.   Disposition:  Follow up 12/2018 with Dr. Angelena Form (pt wishes to have 4 month f/u for EKG, BP check)  Signed, Charlie Pitter, PA-C  08/28/2018 9:52 AM    [place]

## 2018-08-28 ENCOUNTER — Other Ambulatory Visit: Payer: Self-pay

## 2018-08-28 ENCOUNTER — Telehealth (INDEPENDENT_AMBULATORY_CARE_PROVIDER_SITE_OTHER): Payer: Medicare Other | Admitting: Physician Assistant

## 2018-08-28 ENCOUNTER — Encounter: Payer: Self-pay | Admitting: Physician Assistant

## 2018-08-28 VITALS — BP 140/70 | Ht 69.0 in | Wt 209.0 lb

## 2018-08-28 DIAGNOSIS — I4510 Unspecified right bundle-branch block: Secondary | ICD-10-CM

## 2018-08-28 DIAGNOSIS — E785 Hyperlipidemia, unspecified: Secondary | ICD-10-CM

## 2018-08-28 DIAGNOSIS — I2510 Atherosclerotic heart disease of native coronary artery without angina pectoris: Secondary | ICD-10-CM

## 2018-08-28 DIAGNOSIS — I5032 Chronic diastolic (congestive) heart failure: Secondary | ICD-10-CM

## 2018-08-28 DIAGNOSIS — I10 Essential (primary) hypertension: Secondary | ICD-10-CM

## 2018-08-28 MED ORDER — CLOPIDOGREL BISULFATE 75 MG PO TABS: 75.0000 mg | ORAL_TABLET | Freq: Every day | ORAL | 3 refills | Status: DC

## 2018-08-28 MED ORDER — HYDRALAZINE HCL 25 MG PO TABS: 25.0000 mg | ORAL_TABLET | Freq: Three times a day (TID) | ORAL | 3 refills | Status: DC

## 2018-08-28 NOTE — Patient Instructions (Addendum)
Medication Instructions:  Your physician has recommended you make the following change in your medication:  1.  START Hydralazine 25 mg taking 1 tablet by mouth 3 times a day [facility]  If you need a refill on your cardiac medications before your next appointment, please call your pharmacy.   Lab work: None ordered  If you have labs (blood work) drawn today and your tests are completely normal, you will receive your results only by: Marland Kitchen MyChart Message (if you have MyChart) OR . A paper copy in the mail If you have any lab test that is abnormal or we need to change your treatment, we will call you to review the results.  Testing/Procedures: None ordered  Follow-Up: At [facility], you and your health needs are our priority.  As part of our continuing mission to provide you with exceptional heart care, we have created designated Provider Care Teams.  These Care Teams include your primary Cardiologist (physician) and Advanced Practice Providers (APPs -  Physician Assistants and Nurse Practitioners) who all work together to provide you with the care you need, when you need it. You will need a follow up appointment in 4 months.  YOU HAVE BEEN SCHEDULED FOR AN IN OFFICE VISIT 01/10/2019 AT 2:40 with Dr. Angelena Form. Any Other Special Instructions Will Be Listed Below (If Applicable).  Please call your primary care to discuss scheduling the labs that were postponed.   Your goal LDL (Bad cholesterol) is less than 70. If your level is higher than 70, would recommend you contact our office to discuss referral to our pharmacist to review PCSK9 medications. These are injections that can help lower your cholesterol and heart disease risks. Their names are Praluent and [place].   Check your BP daily at least 2-3 hours after taking your morning medicines. Send [place] in a message through MyChart to review, along with an update of how your swelling is. Regarding your swelling, you  should stick to less than 2,000mg  of sodium daily (read labels!). Try to elevate your legs whenever possible and you can also try compression hose.

## 2018-10-06 ENCOUNTER — Other Ambulatory Visit: Payer: Self-pay | Admitting: Cardiovascular Disease

## 2018-11-10 ENCOUNTER — Other Ambulatory Visit: Payer: Self-pay | Admitting: *Deleted

## 2018-11-10 MED ORDER — MONTELUKAST SODIUM 10 MG PO TABS: ORAL_TABLET | ORAL | 1 refills | Status: DC

## 2018-11-19 ENCOUNTER — Other Ambulatory Visit: Payer: Self-pay | Admitting: Internal Medicine

## 2018-11-29 ENCOUNTER — Other Ambulatory Visit (INDEPENDENT_AMBULATORY_CARE_PROVIDER_SITE_OTHER): Payer: Medicare Other

## 2018-11-29 DIAGNOSIS — Z0000 Encounter for general adult medical examination without abnormal findings: Secondary | ICD-10-CM

## 2018-11-29 DIAGNOSIS — E785 Hyperlipidemia, unspecified: Secondary | ICD-10-CM | POA: Diagnosis not present

## 2018-11-29 DIAGNOSIS — N320 Bladder-neck obstruction: Secondary | ICD-10-CM | POA: Diagnosis not present

## 2018-11-29 LAB — URINALYSIS
Bilirubin Urine: NEGATIVE
Hgb urine dipstick: NEGATIVE
Ketones, ur: NEGATIVE
Leukocytes,Ua: NEGATIVE
Nitrite: NEGATIVE
Specific Gravity, Urine: 1.02 (ref 1.000–1.030)
Total Protein, Urine: NEGATIVE
Urine Glucose: NEGATIVE
Urobilinogen, UA: 0.2 (ref 0.0–1.0)
pH: 6 (ref 5.0–8.0)

## 2018-11-29 LAB — BASIC METABOLIC PANEL
BUN: 22 mg/dL (ref 6–23)
CO2: 31 mEq/L (ref 19–32)
Calcium: 9.5 mg/dL (ref 8.4–10.5)
Chloride: 101 mEq/L (ref 96–112)
Creatinine, Ser: 1.56 mg/dL — ABNORMAL HIGH (ref 0.40–1.50)
GFR: 44.41 mL/min — ABNORMAL LOW (ref >60.00)
Glucose, Bld: 133 mg/dL — ABNORMAL HIGH (ref 70–99)
Potassium: 4.1 mEq/L (ref 3.5–5.1)
Sodium: 140 mEq/L (ref 135–145)

## 2018-11-29 LAB — LIPID PANEL
Cholesterol: 175 mg/dL (ref 0–200)
HDL: 40.2 mg/dL (ref >39.00)
LDL Cholesterol: 113 mg/dL — ABNORMAL HIGH (ref 0–99)
NonHDL: 134.62
Total CHOL/HDL Ratio: 4
Triglycerides: 108 mg/dL (ref 0.0–149.0)
VLDL: 21.6 mg/dL (ref 0.0–40.0)

## 2018-11-29 LAB — HEPATIC FUNCTION PANEL
ALT: 23 U/L (ref 0–53)
AST: 18 U/L (ref 0–37)
Albumin: 4.3 g/dL (ref 3.5–5.2)
Alkaline Phosphatase: 71 U/L (ref 39–117)
Bilirubin, Direct: 0.1 mg/dL (ref 0.0–0.3)
Total Bilirubin: 0.7 mg/dL (ref 0.2–1.2)
Total Protein: 6.6 g/dL (ref 6.0–8.3)

## 2018-11-29 LAB — PSA: PSA: 0.44 ng/mL (ref 0.10–4.00)

## 2018-11-29 LAB — CBC WITH DIFFERENTIAL/PLATELET
Basophils Absolute: 0.1 10*3/uL (ref 0.0–0.1)
Basophils Relative: 0.7 % (ref 0.0–3.0)
Eosinophils Absolute: 0.4 10*3/uL (ref 0.0–0.7)
Eosinophils Relative: 4.6 % (ref 0.0–5.0)
HCT: 43.6 % (ref 39.0–52.0)
Hemoglobin: 14.8 g/dL (ref 13.0–17.0)
Lymphocytes Relative: 20.4 % (ref 12.0–46.0)
Lymphs Abs: 1.9 10*3/uL (ref 0.7–4.0)
MCHC: 33.8 g/dL (ref 30.0–36.0)
MCV: 96.1 fl (ref 78.0–100.0)
Monocytes Absolute: 1 10*3/uL (ref 0.1–1.0)
Monocytes Relative: 11.3 % (ref 3.0–12.0)
Neutro Abs: 5.8 10*3/uL (ref 1.4–7.7)
Neutrophils Relative %: 63 % (ref 43.0–77.0)
Platelets: 158 10*3/uL (ref 150.0–400.0)
RBC: 4.54 Mil/uL (ref 4.22–5.81)
RDW: 13.2 % (ref 11.5–15.5)
WBC: 9.2 10*3/uL (ref 4.0–10.5)

## 2018-11-29 LAB — TSH: TSH: 1.91 u[IU]/mL (ref 0.35–4.50)

## 2018-12-05 NOTE — Progress Notes (Addendum)
Subjective:   Larry Stone is a 68 y.o. male who presents for Medicare Annual/Subsequent preventive examination. I connected with patient by a telephone and verified that I am speaking with the correct person using two identifiers. Patient stated full name and DOB. Patient gave permission to continue with telephonic visit. Patient's location was at home and Nurse's location was at [place] office.   Review of Systems:   Cardiac Risk Factors include: advanced age (>60men, >71 women);hypertension;male gender Sleep patterns: feels rested on waking, gets up 1 times nightly to void and sleeps 5-8 hours nightly.    Home Safety/Smoke Alarms: Feels safe in home. Smoke alarms in place.  Living environment; residence and Firearm Safety: 1-story house/ trailer. Lives with wife, no needs for DME, good support system Seat Belt Safety/Bike Helmet: Wears seat belt.     Objective:    Vitals: There were no vitals taken for this visit.  There is no height or weight on file to calculate BMI.  Advanced Directives 12/06/2018 10/04/2017 07/13/2016 07/13/2016 07/13/2016 06/17/2016 09/03/2015  Does Patient Have a Medical Advance Directive? Yes Yes - Yes Yes Yes Yes  Type of Advance Directive [place];Living will - [place];Living will [place];Living will - [place];Living will [place];Living will  Does patient want to make changes to medical advance directive? - - - No - Patient declined - No - Patient declined -  Copy of [place] in Chart? No - copy requested - No - copy requested - - No - copy requested -    Tobacco Social History   Tobacco Use  Smoking Status Former Smoker  . Packs/day: 1.00  . Years: 20.00  . Pack years: 20.00  . Types: Cigarettes, Cigars  . Quit date: 05/08/1998  . Years since quitting: 20.5  Smokeless Tobacco Never Used  Tobacco Comment   quit in 2005      Counseling given: Not Answered Comment: quit in 2005  Past Medical History:  Diagnosis Date  . Anxiety   . CAD    a. acute inferior lateral wall infarction in September 2004 treated medically. b.  cath 06/17/16 showing mild nonobstructive CAD with 30-40% ostial LM (eccentric), elevated LV filling pressures and normal LV function.  . Chronic diastolic CHF (congestive heart failure) ([place])   . CKD (chronic kidney disease), stage II   . COPD (chronic obstructive pulmonary disease) ([place])   . Diabetes mellitus   . Diverticulosis   . DJD (degenerative joint disease)   . GERD (gastroesophageal reflux disease)   . HTN (hypertension)   . Hyperlipidemia   . Low back pain syndrome   . Myocardial infarction ([place]) 2004  . Obesity   . OSA (obstructive sleep apnea)   . RBBB   . Recurrent aspiration bronchitis/pneumonia ([place])   . Recurrent aspiration pneumonia ([place])    [name] 07/13/2016  . Thrombocytopenia ([place])   . Tubular adenoma of colon 2014   Past Surgical History:  Procedure Laterality Date  . CARDIAC CATHETERIZATION    . LEFT HEART CATH AND CORONARY ANGIOGRAPHY N/A 06/17/2016   Procedure: Left Heart Cath and Coronary Angiography;  Surgeon: Burnell Blanks, MD;  Location: [facility];  Service: Cardiovascular;  Laterality: N/A;  . UVULOPALATOPHARYNGOPLASTY     surgery for OSA   Family History  Problem Relation Age of Onset  . Colon cancer Father   . Stroke Mother   . Heart disease  Mother   . Hypertension Sister   . Diabetes Sister   . Colon cancer Paternal Uncle    Social History   Socioeconomic History  . Marital status: Married    Spouse name: Not on file  . Number of children: 1  . Years of education: Not on file  . Highest education level: Not on file  Occupational History  . Occupation: Probation officer of [employer]  . Financial resource strain: Not hard at all  . Food insecurity    Worry: Never true    Inability: Never true  .  Transportation needs    Medical: No    Non-medical: No  Tobacco Use  . Smoking status: Former Smoker    Packs/day: 1.00    Years: 20.00    Pack years: 20.00    Types: Cigarettes, Cigars    Quit date: 05/08/1998    Years since quitting: 20.5  . Smokeless tobacco: Never Used  . Tobacco comment: quit in 2005  Substance and Sexual Activity  . Alcohol use: Yes    Alcohol/week: 1.0 standard drinks    Types: 1 Standard drinks or equivalent per week    Comment: social drinker  . Drug use: No  . Sexual activity: Yes  Lifestyle  . Physical activity    Days per week: 4 days    Minutes per session: 50 min  . Stress: To some extent  Relationships  . Social connections    Talks on phone: More than three times a week    Gets together: More than three times a week    Attends religious service: Not on file    Active member of club or organization: Yes    Attends meetings of clubs or organizations: Not on file    Relationship status: Married  Other Topics Concern  . Not on file  Social History Narrative  . Not on file    Outpatient Encounter Medications as of 12/06/2018  Medication Sig  . albuterol (PROAIR HFA) 108 (90 Base) MCG/ACT inhaler Inhale 1-2 puffs into the lungs every 6 (six) hours as needed for wheezing or shortness of breath.  Marland Kitchen albuterol (PROVENTIL) (2.5 MG/3ML) 0.083% nebulizer solution Use one vial in the nebulizer every 4-6 hours if needed for cough or wheeze  . ALPRAZolam (XANAX) 0.5 MG tablet TAKE 1 TABLET BY MOUTH THREE TIMES A DAY AS NEEDED  . ammonium lactate (LAC-HYDRIN) 12 % lotion Apply topically as directed.  . budesonide-formoterol (SYMBICORT) 160-4.5 MCG/ACT inhaler INHALE 2 PUFFS INTO THE LUNGS 2 (TWO) TIMES DAILY.  Marland Kitchen buPROPion (WELLBUTRIN SR) 150 MG 12 hr tablet Take 1 tablet (150 mg total) by mouth 2 (two) times daily.  . carvedilol (COREG) 25 MG tablet Take 1 tablet (25 mg total) by mouth 2 (two) times daily with a meal.  . cetirizine (ZYRTEC) 10 MG tablet  Take 1 tablet (10 mg total) by mouth daily.  . Cholecalciferol (VITAMIN D3) 5000 UNITS CAPS Take one tablet by mouth daily  . clopidogrel (PLAVIX) 75 MG tablet Take 1 tablet (75 mg total) by mouth daily.  . clotrimazole-betamethasone (LOTRISONE) cream APPLY AS DIRECTED  . Coenzyme Q10 (COQ10) 100 MG CAPS Take as directed  . cyanocobalamin 1000 MCG tablet Take 1,000 mcg by mouth daily.  . montelukast (SINGULAIR) 10 MG tablet TAKE 1 TABLET BY MOUTH EVERYDAY AT BEDTIME  . nitroGLYCERIN (NITROSTAT) 0.4 MG SL tablet PLACE 1 TABLET UNDER THE TONGUE EVERY 5 MINUTES AS NEEDED. FOR UP TO 3 DOSES.  Marland Kitchen  pantoprazole (PROTONIX) 40 MG tablet Take 1 tablet (40 mg total) by mouth daily.  . pravastatin (PRAVACHOL) 20 MG tablet TAKE 1 TABLET BY MOUTH EVERY DAY  . sildenafil (VIAGRA) 100 MG tablet TAKE 1 TABLET BY MOUTH AS NEEDED FOR ERECTILE DYSFUNCTION.  Marland Kitchen triamterene-hydrochlorothiazide (MAXZIDE) 75-50 MG tablet TAKE 1 TABLET EVERY DAY  . vitamin C (ASCORBIC ACID) 500 MG tablet Take 500 mg by mouth daily.  . [DISCONTINUED] ammonium lactate (LAC-HYDRIN) 12 % lotion Apply topically as directed.  . [DISCONTINUED] clotrimazole-betamethasone (LOTRISONE) cream APPLY AS DIRECTED  . hydrALAZINE (APRESOLINE) 25 MG tablet Take 1 tablet (25 mg total) by mouth 3 (three) times daily.   Facility-Administered Encounter Medications as of 12/06/2018  Medication  . 0.9 %  sodium chloride infusion    Activities of Daily Living In your present state of health, do you have any difficulty performing the following activities: 12/06/2018  Hearing? Y  Vision? N  Difficulty concentrating or making decisions? N  Walking or climbing stairs? N  Dressing or bathing? N  Doing errands, shopping? N  Preparing Food and eating ? N  Using the Toilet? N  In the past six months, have you accidently leaked urine? N  Do you have problems with loss of bowel control? N  Managing your Medications? N  Managing your Finances? N  Housekeeping  or managing your Housekeeping? N  Some recent data might be hidden    Patient Care Team: Plotnikov, Evie Lacks, MD as PCP - General (Internal Medicine) Burnell Blanks, MD as PCP - Cardiology (Cardiology) Noralee Space, MD (Pulmonary Disease) Burnell Blanks, MD as Consulting Physician (Cardiology) Inda Castle, MD (Inactive) as Consulting Physician (Gastroenterology)   Assessment:   This is a routine wellness examination for Elizah. Physical assessment deferred to PCP.   Exercise Activities and Dietary recommendations Current Exercise Habits: Home exercise routine, Type of exercise: walking, Time (Minutes): 50, Frequency (Times/Week): 4, Weekly Exercise (Minutes/Week): 200, Intensity: Mild, Exercise limited by: None identified  Diet (meal preparation, eat out, water intake, caffeinated beverages, dairy products, fruits and vegetables): in general, a "healthy" diet  , well balanced. eats a variety of fruits and vegetables daily,  Encouraged patient to increase daily water and healthy fluid intake.  Goals    . Patient Stated       Fall Risk Fall Risk  12/06/2018 07/12/2017 03/30/2016  Falls in the past year? 0 No No  Number falls in past yr: 0 - -  Injury with Fall? 0 - -   Depression Screen PHQ 2/9 Scores 12/06/2018 12/22/2016 03/30/2016 11/14/2012  PHQ - 2 Score 2 1 0 0  PHQ- 9 Score 3 1 - -    Cognitive Function       Ad8 score reviewed for issues:  Issues making decisions: no  Less interest in hobbies / activities: no  Repeats questions, stories (family complaining): no  Trouble using ordinary gadgets (microwave, computer, phone):no  Forgets the month or year: no  Mismanaging finances: no  Remembering appts: no  Daily problems with thinking and/or memory: no Ad8 score is= 0  Immunization History  Administered Date(s) Administered  . Influenza Split 02/02/2011, 03/03/2012  . Influenza Whole 02/02/2010  . Influenza, High Dose Seasonal PF  02/20/2013, 01/16/2015, 12/16/2015, 12/22/2016, 01/04/2018  . Influenza, Seasonal, Injecte, Preservative Fre 02/21/2014  . Pneumococcal Conjugate-13 03/05/2014  . Pneumococcal Polysaccharide-23 06/18/2010, 09/03/2015  . Tdap 11/14/2012  . Zoster 09/11/2012  . Zoster Recombinat (Shingrix) 12/22/2016, 03/14/2017   Screening  Tests Health Maintenance  Topic Date Due  . INFLUENZA VACCINE  11/18/2018  . COLONOSCOPY  10/04/2020  . TETANUS/TDAP  11/15/2022  . Hepatitis C Screening  Completed  . PNA vac Low Risk Adult  Completed      Plan:     Refills for lotrisone cream and LAC-Hydrin lotion sent to pharmacy. Referral to audiology placed to evaluate patient's concerns regarding hearing loss.   Reviewed health maintenance screenings with patient today and relevant education, vaccines, and/or referrals were provided.   Continue to eat heart healthy diet (full of fruits, vegetables, whole grains, lean protein, water--limit salt, fat, and sugar intake) and increase physical activity as tolerated.  Continue doing brain stimulating activities (puzzles, reading, adult coloring books, staying active) to keep memory sharp.   I have personally reviewed and noted the following in the patient's chart:   . Medical and social history . Use of alcohol, tobacco or illicit drugs  . Current medications and supplements . Functional ability and status . Nutritional status . Physical activity . Advanced directives . List of other physicians . Screenings to include cognitive, depression, and falls . Referrals and appointments  In addition, I have reviewed and discussed with patient certain preventive protocols, quality metrics, and best practice recommendations. A written personalized care plan for preventive services as well as general preventive health recommendations were provided to patient.     Michiel Cowboy, RN  12/06/2018  Medical screening examination/treatment/procedure(s) were performed by  non-physician practitioner and as supervising physician I was immediately available for consultation/collaboration. I agree with above. Lew Dawes, MD

## 2018-12-06 ENCOUNTER — Telehealth: Payer: Self-pay | Admitting: *Deleted

## 2018-12-06 ENCOUNTER — Ambulatory Visit (INDEPENDENT_AMBULATORY_CARE_PROVIDER_SITE_OTHER): Payer: Medicare Other | Admitting: *Deleted

## 2018-12-06 DIAGNOSIS — H9193 Unspecified hearing loss, bilateral: Secondary | ICD-10-CM

## 2018-12-06 DIAGNOSIS — Z0000 Encounter for general adult medical examination without abnormal findings: Secondary | ICD-10-CM | POA: Diagnosis not present

## 2018-12-06 MED ORDER — CLOTRIMAZOLE-BETAMETHASONE 1-0.05 % EX CREA: TOPICAL_CREAM | CUTANEOUS | 11 refills | Status: DC

## 2018-12-06 MED ORDER — AMMONIUM LACTATE 12 % EX LOTN: TOPICAL_LOTION | CUTANEOUS | 3 refills | Status: DC

## 2018-12-06 NOTE — Telephone Encounter (Signed)
During AWV, patient had several concerns of which he stated he would like to discuss with PCP during his upcoming visit on 12/11/18. Patient explained that he was not able to tolerate the hydralazine medication and is concerned about his blood pressure readings which are fairly consistent in the 140/80 range. Larry Stone stated that he is concerned about his latest lab results, explaining that his cholesterol, blood sugar and kidney function levels have all gotten worse. Patient said that his diet and physical activity have not changed from the past and he questions if his current cholesterol and blood pressure medications are effective; explaining that his lab values were better on his previous medications. Patient stated that emotionally he is having a difficult time and is uncertain if the bupropion and alprazolam are effective enough.

## 2018-12-06 NOTE — Telephone Encounter (Signed)
Noted! Thank you

## 2018-12-11 ENCOUNTER — Encounter: Payer: Self-pay | Admitting: Internal Medicine

## 2018-12-11 ENCOUNTER — Ambulatory Visit (INDEPENDENT_AMBULATORY_CARE_PROVIDER_SITE_OTHER): Payer: Medicare Other | Admitting: Internal Medicine

## 2018-12-11 ENCOUNTER — Other Ambulatory Visit: Payer: Self-pay

## 2018-12-11 VITALS — BP 142/74 | HR 62 | Temp 97.7°F | Ht 69.0 in | Wt 206.0 lb

## 2018-12-11 DIAGNOSIS — R7989 Other specified abnormal findings of blood chemistry: Secondary | ICD-10-CM | POA: Diagnosis not present

## 2018-12-11 DIAGNOSIS — E785 Hyperlipidemia, unspecified: Secondary | ICD-10-CM | POA: Diagnosis not present

## 2018-12-11 DIAGNOSIS — R739 Hyperglycemia, unspecified: Secondary | ICD-10-CM | POA: Diagnosis not present

## 2018-12-11 DIAGNOSIS — I2510 Atherosclerotic heart disease of native coronary artery without angina pectoris: Secondary | ICD-10-CM

## 2018-12-11 DIAGNOSIS — Z23 Encounter for immunization: Secondary | ICD-10-CM

## 2018-12-11 MED ORDER — PRAVASTATIN SODIUM 40 MG PO TABS: 40.0000 mg | ORAL_TABLET | Freq: Every day | ORAL | 3 refills | Status: DC

## 2018-12-11 MED ORDER — METFORMIN HCL 500 MG PO TABS: 500.0000 mg | ORAL_TABLET | Freq: Every day | ORAL | 3 refills | Status: DC

## 2018-12-11 MED ORDER — TRIAMTERENE-HCTZ 75-50 MG PO TABS: 0.5000 | ORAL_TABLET | Freq: Every day | ORAL | 3 refills | Status: DC

## 2018-12-11 NOTE — Assessment & Plan Note (Signed)
Increase Pravastatin to 40 mg/d

## 2018-12-11 NOTE — Assessment & Plan Note (Signed)
Reduce Maxzide Hydrate well

## 2018-12-11 NOTE — Assessment & Plan Note (Signed)
Labs Start Metformin

## 2018-12-11 NOTE — Progress Notes (Signed)
Subjective:  Patient ID: Larry Stone, male    DOB: [birth year]  Age: 68 y.o. MRN: [id number]  CC: No chief complaint on file.   HPI Larry Stone presents for dyslipidemia, myalgias, DM, CAD f/u CBGs 117-170  Outpatient Medications Prior to Visit  Medication Sig Dispense Refill  . albuterol (PROAIR HFA) 108 (90 Base) MCG/ACT inhaler Inhale 1-2 puffs into the lungs every 6 (six) hours as needed for wheezing or shortness of breath. 1 Inhaler 11  . albuterol (PROVENTIL) (2.5 MG/3ML) 0.083% nebulizer solution Use one vial in the nebulizer every 4-6 hours if needed for cough or wheeze 225 mL 11  . ALPRAZolam (XANAX) 0.5 MG tablet TAKE 1 TABLET BY MOUTH THREE TIMES A DAY AS NEEDED 90 tablet 3  . ammonium lactate (LAC-HYDRIN) 12 % lotion Apply topically as directed. 400 g 3  . budesonide-formoterol (SYMBICORT) 160-4.5 MCG/ACT inhaler INHALE 2 PUFFS INTO THE LUNGS 2 (TWO) TIMES DAILY. 3 Inhaler 3  . buPROPion (WELLBUTRIN SR) 150 MG 12 hr tablet Take 1 tablet (150 mg total) by mouth 2 (two) times daily. 180 tablet 3  . carvedilol (COREG) 25 MG tablet Take 1 tablet (25 mg total) by mouth 2 (two) times daily with a meal. 180 tablet 3  . cetirizine (ZYRTEC) 10 MG tablet Take 1 tablet (10 mg total) by mouth daily. 30 tablet 11  . Cholecalciferol (VITAMIN D3) 5000 UNITS CAPS Take one tablet by mouth daily    . clopidogrel (PLAVIX) 75 MG tablet Take 1 tablet (75 mg total) by mouth daily. 90 tablet 3  . clotrimazole-betamethasone (LOTRISONE) cream APPLY AS DIRECTED 45 g 11  . Coenzyme Q10 (COQ10) 100 MG CAPS Take as directed 30 each 11  . cyanocobalamin 1000 MCG tablet Take 1,000 mcg by mouth daily.    . montelukast (SINGULAIR) 10 MG tablet TAKE 1 TABLET BY MOUTH EVERYDAY AT BEDTIME 90 tablet 1  . nitroGLYCERIN (NITROSTAT) 0.4 MG SL tablet PLACE 1 TABLET UNDER THE TONGUE EVERY 5 MINUTES AS NEEDED. FOR UP TO 3 DOSES. 25 tablet 5  . pantoprazole (PROTONIX) 40 MG tablet Take 1 tablet (40 mg total) by  mouth daily. 90 tablet 3  . sildenafil (VIAGRA) 100 MG tablet TAKE 1 TABLET BY MOUTH AS NEEDED FOR ERECTILE DYSFUNCTION. 30 tablet 3  . triamterene-hydrochlorothiazide (MAXZIDE) 75-50 MG tablet TAKE 1 TABLET EVERY DAY 90 tablet 3  . vitamin C (ASCORBIC ACID) 500 MG tablet Take 500 mg by mouth daily.    . hydrALAZINE (APRESOLINE) 25 MG tablet Take 1 tablet (25 mg total) by mouth 3 (three) times daily. 270 tablet 3  . pravastatin (PRAVACHOL) 20 MG tablet TAKE 1 TABLET BY MOUTH EVERY DAY (Patient not taking: Reported on 12/11/2018) 90 tablet 3   Facility-Administered Medications Prior to Visit  Medication Dose Route Frequency Provider Last Rate Last Dose  . 0.9 %  sodium chloride infusion  500 mL Intravenous Continuous Danis, Estill Cotta III, MD        ROS: Review of Systems  Constitutional: Negative for appetite change, fatigue and unexpected weight change.  HENT: Negative for congestion, nosebleeds, sneezing, sore throat and trouble swallowing.   Eyes: Negative for itching and visual disturbance.  Respiratory: Negative for cough.   Cardiovascular: Negative for chest pain, palpitations and leg swelling.  Gastrointestinal: Negative for abdominal distention, blood in stool, diarrhea and nausea.  Genitourinary: Negative for frequency and hematuria.  Musculoskeletal: Positive for arthralgias. Negative for back pain, gait problem, joint swelling and neck  pain.  Skin: Negative for rash.  Neurological: Negative for dizziness, tremors, speech difficulty and weakness.  Psychiatric/Behavioral: Negative for agitation, dysphoric mood and sleep disturbance. The patient is not nervous/anxious.     Objective:  BP (!) 142/74 (BP Location: Left Arm, Patient Position: Sitting, Cuff Size: Large)   Pulse 62   Temp 97.7 F (36.5 C) (Oral)   Ht 5\' 9"  (1.753 m)   Wt 206 lb (93.4 kg)   SpO2 97%   BMI 30.42 kg/m   BP Readings from Last 3 Encounters:  12/11/18 (!) 142/74  08/28/18 140/70  06/07/18 124/68     Wt Readings from Last 3 Encounters:  12/11/18 206 lb (93.4 kg)  08/28/18 209 lb (94.8 kg)  06/07/18 213 lb (96.6 kg)    Physical Exam Constitutional:      General: He is not in acute distress.    Appearance: He is well-developed.     Comments: NAD  Eyes:     Conjunctiva/sclera: Conjunctivae normal.     Pupils: Pupils are equal, round, and reactive to light.  Neck:     Musculoskeletal: Normal range of motion.     Thyroid: No thyromegaly.     Vascular: No JVD.  Cardiovascular:     Rate and Rhythm: Normal rate and regular rhythm.     Heart sounds: Normal heart sounds. No murmur. No friction rub. No gallop.   Pulmonary:     Effort: Pulmonary effort is normal. No respiratory distress.     Breath sounds: Normal breath sounds. No wheezing or rales.  Chest:     Chest wall: No tenderness.  Abdominal:     General: Bowel sounds are normal. There is no distension.     Palpations: Abdomen is soft. There is no mass.     Tenderness: There is no abdominal tenderness. There is no guarding or rebound.  Musculoskeletal: Normal range of motion.        General: No tenderness.  Lymphadenopathy:     Cervical: No cervical adenopathy.  Skin:    General: Skin is warm and dry.     Findings: No rash.  Neurological:     Mental Status: He is alert and oriented to person, place, and time.     Cranial Nerves: No cranial nerve deficit.     Motor: No abnormal muscle tone.     Coordination: Coordination normal.     Gait: Gait normal.     Deep Tendon Reflexes: Reflexes are normal and symmetric.  Psychiatric:        Behavior: Behavior normal.        Thought Content: Thought content normal.        Judgment: Judgment normal.     Lab Results  Component Value Date   WBC 9.2 11/29/2018   HGB 14.8 11/29/2018   HCT 43.6 11/29/2018   PLT 158.0 11/29/2018   GLUCOSE 133 (H) 11/29/2018   CHOL 175 11/29/2018   TRIG 108.0 11/29/2018   HDL 40.20 11/29/2018   LDLCALC 113 (H) 11/29/2018   ALT 23  11/29/2018   AST 18 11/29/2018   NA 140 11/29/2018   K 4.1 11/29/2018   CL 101 11/29/2018   CREATININE 1.56 (H) 11/29/2018   BUN 22 11/29/2018   CO2 31 11/29/2018   TSH 1.91 11/29/2018   PSA 0.44 11/29/2018   INR 1.0 06/09/2016   HGBA1C 6.0 01/17/2018    Dg Chest 2 View  Result Date: 04/03/2018 CLINICAL DATA:  68 y/o M; fall landing onto  the left upper arm with pain to the mid left posterior ribs. EXAM: CHEST - 2 VIEW COMPARISON:  08/20/2016 chest radiograph FINDINGS: Stable heart size and mediastinal contours are within normal limits. Both lungs are clear. No acute fracture identified. IMPRESSION: No active cardiopulmonary disease. No acute fracture identified. If clinically indicated, consider dedicated rib radiographs. Electronically Signed   By: Kristine Garbe M.D.   On: 04/03/2018 14:14    Assessment & Plan:   Diagnoses and all orders for this visit:  Need for influenza vaccination -     Flu Vaccine QUAD High Dose(Fluad)     No orders of the defined types were placed in this encounter.    Follow-up: No follow-ups on file.  [name], MD

## 2018-12-11 NOTE — Patient Instructions (Addendum)
Maxzide 1/2 tab a day   These suggestions will probably help you to improve your metabolism if you are not overweight and to lose weight if you are overweight: 1.  Reduce your consumption of sugars and starches.  Eliminate high fructose corn syrup from your diet.  Reduce your consumption of processed foods.  For desserts try to have seasonal fruits, berries, nuts, cheeses or dark chocolate with more than 70% cacao. 2.  Do not snack 3.  You do not have to eat breakfast.  If you choose to have breakfast-eat plain greek yogurt, eggs, oatmeal (without sugar) 4.  Drink water, freshly brewed unsweetened tea (green, black or herbal) or coffee.  Do not drink sodas including diet sodas , juices, beverages sweetened with artificial sweeteners. 5.  Reduce your consumption of refined grains. 6.  Avoid protein drinks such as Optifast, Slim fast etc. Eat chicken, fish, meat, dairy and beans for your sources of protein 7.  Natural unprocessed fats like cold pressed virgin olive oil, butter, coconut oil are good for you.  Eat avocados 8.  Increase your consumption of fiber.  Fruits, berries, vegetables, whole grains, flaxseeds, Chia seeds, beans, popcorn, nuts, oatmeal are good sources of fiber 9.  Use vinegar in your diet, i.e. apple cider vinegar, red wine or balsamic vinegar 10.  You can try fasting.  For example you can skip breakfast and lunch every other day (24-hour fast) 11.  Stress reduction, good night sleep, relaxation, meditation, yoga and other physical activity is likely to help you to maintain low weight too. 12.  If you drink alcohol, limit your alcohol intake to no more than 2 drinks a day.   Mediterranean diet is good for you. ([employer] Bosworth has a typical Mediterranean cuisine menu) The Mediterranean diet is a way of eating based on the traditional cuisine of countries bordering the [employer]. While there is no single definition of the Mediterranean diet, it is typically high in  vegetables, fruits, whole grains, beans, nut and seeds, and olive oil. The main components of Mediterranean diet include: Marland Kitchen Daily consumption of vegetables, fruits, whole grains and healthy fats  . Weekly intake of fish, poultry, beans and eggs  . Moderate portions of dairy products  . Limited intake of red meat Other important elements of the Mediterranean diet are sharing meals with family and friends, enjoying a glass of red wine and being physically active. Health benefits of a Mediterranean diet: A traditional Mediterranean diet consisting of large quantities of fresh fruits and vegetables, nuts, fish and olive oil-coupled with physical activity-can reduce your risk of serious mental and physical health problems by: Preventing heart disease and strokes. Following a Mediterranean diet limits your intake of refined breads, processed foods, and red meat, and encourages drinking red wine instead of hard liquor-all factors that can help prevent heart disease and stroke. Keeping you agile. If you're an older adult, the nutrients gained with a Mediterranean diet may reduce your risk of developing muscle weakness and other signs of frailty by about 70 percent. Reducing the risk of Alzheimer's. Research suggests that the [place] diet may improve cholesterol, blood sugar levels, and overall blood vessel health, which in turn may reduce your risk of Alzheimer's disease or dementia. Halving the risk of Parkinson's disease. The high levels of antioxidants in the Mediterranean diet can prevent cells from undergoing a damaging process called oxidative stress, thereby cutting the risk of Parkinson's disease in half. Increasing longevity. By reducing your risk of developing heart  disease or cancer with the Mediterranean diet, you're reducing your risk of death at any age by 20%. Protecting against type 2 diabetes. A Mediterranean diet is rich in fiber which digests slowly, prevents huge swings in blood  sugar, and can help you maintain a healthy weight.    Cabbage soup recipe that will not make you gain weight: Take 1 small head of cabbage, 1 average pack of celery, 4 green peppers, 4 onions, 2 cans diced tomatoes (they are not available without salt), salt and spices to taste.  Chop cabbage, celery, peppers and onions.  And tomatoes and 2-2.5 liters (2.5 quarts) of water so that it would just cover the vegetables.  Bring to boil.  Add spices and salt.  Turn heat to low/medium and simmer for 20-25 minutes.  Naturally, you can make a smaller batch and change some of the ingredients.

## 2018-12-29 ENCOUNTER — Encounter

## 2019-01-10 ENCOUNTER — Other Ambulatory Visit: Payer: Self-pay

## 2019-01-10 ENCOUNTER — Encounter: Payer: Self-pay | Admitting: Cardiovascular Disease

## 2019-01-10 ENCOUNTER — Other Ambulatory Visit: Payer: Self-pay | Admitting: Internal Medicine

## 2019-01-10 ENCOUNTER — Ambulatory Visit (INDEPENDENT_AMBULATORY_CARE_PROVIDER_SITE_OTHER): Payer: Medicare Other | Admitting: Cardiovascular Disease

## 2019-01-10 VITALS — BP 114/70 | HR 50 | Ht 69.0 in | Wt 205.4 lb

## 2019-01-10 DIAGNOSIS — I5032 Chronic diastolic (congestive) heart failure: Secondary | ICD-10-CM | POA: Diagnosis not present

## 2019-01-10 DIAGNOSIS — I10 Essential (primary) hypertension: Secondary | ICD-10-CM

## 2019-01-10 DIAGNOSIS — E7800 Pure hypercholesterolemia, unspecified: Secondary | ICD-10-CM | POA: Diagnosis not present

## 2019-01-10 DIAGNOSIS — I2510 Atherosclerotic heart disease of native coronary artery without angina pectoris: Secondary | ICD-10-CM | POA: Diagnosis not present

## 2019-01-10 MED ORDER — EZETIMIBE 10 MG PO TABS: 10.0000 mg | ORAL_TABLET | Freq: Every day | ORAL | 3 refills | Status: DC

## 2019-01-10 MED ORDER — HYDRALAZINE HCL 50 MG PO TABS: 50.0000 mg | ORAL_TABLET | Freq: Three times a day (TID) | ORAL | 3 refills | Status: DC

## 2019-01-10 NOTE — Patient Instructions (Signed)
Medication Instructions:  1) START ZETIA 10 mg daily 2) INCREASE HYDRALAZINE to 50 mg three times daily   Follow-Up: Your provider wants you to follow-up in: 1 year with Dr. Angelena Form. You will receive a reminder letter in the mail two months in advance. If you don't receive a letter, please call our office to schedule the follow-up appointment.

## 2019-01-10 NOTE — Progress Notes (Signed)
Chief Complaint  Patient presents with  . Follow-up    CAD    History of Present Illness: 68 yo male with history of CAD, HLD, HTN, OSA and chronic diastolic CHF here today for cardiac follow up. 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 Stress myoview in June 2015 with inferolateral scar and no ischemia. When I saw him in February 2018 he was c/o dyspnea and fatigue. Cardiac cath on 06/17/16 with 40% left main stenosis, mild disease in the diagonal and proximal circumflex. He has history of myalgias with Lipitor and Crestor so he has been on Pravastatin. He is allergic to Ace-inhibitors and ARBs and has has lower extremity edema with Norvasc. He was started on hydralazine in May of 2020.   He is here today for follow up. The patient denies any chest pain, dyspnea, palpitations, lower extremity edema, orthopnea, PND, dizziness, near syncope or syncope. Systolic BP 0000000 at home. He is worried about his recent lipid profile in primary care. He would like to consider Repatha but not sure he can afford.   Primary Care Physician: Cassandria Anger, MD  Past Medical History:  Diagnosis Date  . Anxiety   . CAD    a. acute inferior lateral wall infarction in September 2004 treated medically. b.  cath 06/17/16 showing mild nonobstructive CAD with 30-40% ostial LM (eccentric), elevated LV filling pressures and normal LV function.  . Chronic diastolic CHF (congestive heart failure) ([place])   . CKD (chronic kidney disease), stage II   . COPD (chronic obstructive pulmonary disease) ([place])   . Diabetes mellitus   . Diverticulosis   . DJD (degenerative joint disease)   . GERD (gastroesophageal reflux disease)   . HTN (hypertension)   . Hyperlipidemia   . Low back pain syndrome   . Myocardial infarction ([place]) 2004  . Obesity   . OSA (obstructive sleep apnea)   . RBBB   . Recurrent aspiration bronchitis/pneumonia  ([place])   . Recurrent aspiration pneumonia ([place])    [name] 07/13/2016  . Thrombocytopenia ([place])   . Tubular adenoma of colon 2014    Past Surgical History:  Procedure Laterality Date  . CARDIAC CATHETERIZATION    . LEFT HEART CATH AND CORONARY ANGIOGRAPHY N/A 06/17/2016   Procedure: Left Heart Cath and Coronary Angiography;  Surgeon: Burnell Blanks, MD;  Location: [facility];  Service: Cardiovascular;  Laterality: N/A;  . UVULOPALATOPHARYNGOPLASTY     surgery for OSA    Current Outpatient Medications  Medication Sig Dispense Refill  . albuterol (PROAIR HFA) 108 (90 Base) MCG/ACT inhaler Inhale 1-2 puffs into the lungs every 6 (six) hours as needed for wheezing or shortness of breath. 1 Inhaler 11  . albuterol (PROVENTIL) (2.5 MG/3ML) 0.083% nebulizer solution Use one vial in the nebulizer every 4-6 hours if needed for cough or wheeze 225 mL 11  . ALPRAZolam (XANAX) 0.5 MG tablet TAKE 1 TABLET BY MOUTH THREE TIMES A DAY AS NEEDED 90 tablet 3  . ammonium lactate (LAC-HYDRIN) 12 % lotion Apply topically as directed. 400 g 3  . budesonide-formoterol (SYMBICORT) 160-4.5 MCG/ACT inhaler INHALE 2 PUFFS INTO THE LUNGS 2 (TWO) TIMES DAILY. 3 Inhaler 3  . buPROPion (WELLBUTRIN SR) 150 MG 12 hr tablet Take 1 tablet (150 mg total) by mouth 2 (two) times daily. 180 tablet 3  . carvedilol (COREG) 25 MG tablet Take 1 tablet (25 mg  total) by mouth 2 (two) times daily with a meal. 180 tablet 3  . cetirizine (ZYRTEC) 10 MG tablet Take 1 tablet (10 mg total) by mouth daily. 30 tablet 11  . Cholecalciferol (VITAMIN D3) 5000 UNITS CAPS Take one tablet by mouth daily    . clopidogrel (PLAVIX) 75 MG tablet Take 1 tablet (75 mg total) by mouth daily. 90 tablet 3  . clotrimazole-betamethasone (LOTRISONE) cream APPLY AS DIRECTED 45 g 11  . Coenzyme Q10 (COQ10) 100 MG CAPS Take as directed 30 each 11  . hydrALAZINE (APRESOLINE) 50 MG tablet Take 1 tablet (50 mg total) by mouth 3 (three) times daily.  270 tablet 3  . metFORMIN (GLUCOPHAGE) 500 MG tablet Take 1 tablet (500 mg total) by mouth daily with breakfast. 90 tablet 3  . montelukast (SINGULAIR) 10 MG tablet TAKE 1 TABLET BY MOUTH EVERYDAY AT BEDTIME 90 tablet 1  . nitroGLYCERIN (NITROSTAT) 0.4 MG SL tablet PLACE 1 TABLET UNDER THE TONGUE EVERY 5 MINUTES AS NEEDED. FOR UP TO 3 DOSES. 25 tablet 5  . pantoprazole (PROTONIX) 40 MG tablet Take 1 tablet (40 mg total) by mouth daily. 90 tablet 3  . pravastatin (PRAVACHOL) 40 MG tablet Take 1 tablet (40 mg total) by mouth daily. 90 tablet 3  . sildenafil (VIAGRA) 100 MG tablet TAKE 1 TABLET BY MOUTH AS NEEDED FOR ERECTILE DYSFUNCTION. 30 tablet 3  . triamterene-hydrochlorothiazide (MAXZIDE) 75-50 MG tablet Take 0.5 tablets by mouth daily. 90 tablet 3  . vitamin C (ASCORBIC ACID) 500 MG tablet Take 500 mg by mouth daily.    Marland Kitchen ezetimibe (ZETIA) 10 MG tablet Take 1 tablet (10 mg total) by mouth daily. 90 tablet 3   Current Facility-Administered Medications  Medication Dose Route Frequency Provider Last Rate Last Dose  . 0.9 %  sodium chloride infusion  500 mL Intravenous Continuous Doran Stabler, MD        Allergies  Allergen Reactions  . Ace Inhibitors Swelling  . Angiotensin Receptor Blockers Swelling  . Aspirin Hives  . Amlodipine     Leg swelling  . Crestor [Rosuvastatin Calcium]     myalgias  . Lipitor [Atorvastatin Calcium]     arthralgias  . Codeine Nausea And Vomiting  . Irbesartan Other (See Comments)    REACTION: allergic to ARBs w/ angioedema  . Ramipril Other (See Comments)    REACTION: Allergic to ACE's w/ angioedema...    Social History   Socioeconomic History  . Marital status: Married    Spouse name: Not on file  . Number of children: 1  . Years of education: Not on file  . Highest education level: Not on file  Occupational History  . Occupation: Probation officer of [employer]  . Financial resource strain: Not hard at all  . Food  insecurity    Worry: Never true    Inability: Never true  . Transportation needs    Medical: No    Non-medical: No  Tobacco Use  . Smoking status: Former Smoker    Packs/day: 1.00    Years: 20.00    Pack years: 20.00    Types: Cigarettes, Cigars    Quit date: 05/08/1998    Years since quitting: 20.6  . Smokeless tobacco: Never Used  . Tobacco comment: quit in 2005  Substance and Sexual Activity  . Alcohol use: Yes    Alcohol/week: 1.0 standard drinks    Types: 1 Standard drinks or equivalent per week  Comment: social drinker  . Drug use: No  . Sexual activity: Yes  Lifestyle  . Physical activity    Days per week: 4 days    Minutes per session: 50 min  . Stress: To some extent  Relationships  . Social connections    Talks on phone: More than three times a week    Gets together: More than three times a week    Attends religious service: Not on file    Active member of club or organization: Yes    Attends meetings of clubs or organizations: Not on file    Relationship status: Married  . Intimate partner violence    Fear of current or ex partner: No    Emotionally abused: No    Physically abused: No    Forced sexual activity: No  Other Topics Concern  . Not on file  Social History Narrative  . Not on file    Family History  Problem Relation Age of Onset  . Colon cancer Father   . Stroke Mother   . Heart disease Mother   . Hypertension Sister   . Diabetes Sister   . Colon cancer Paternal Uncle     Review of Systems:  As stated in the HPI and otherwise negative.   BP 114/70   Pulse (!) 50   Ht 5\' 9"  (1.753 m)   Wt 205 lb 6.4 oz (93.2 kg)   SpO2 97%   BMI 30.33 kg/m   Physical Examination:  Echo 08/30/13: Left ventricle: The cavity size was normal. Systolic function was normal. The estimated ejection fraction was in the range of 55% to 60%. Probable mild hypokinesis of the basal-midinferolateral and inferior myocardium. Doppler parameters are  consistent with abnormal left ventricular relaxation (grade 1 diastolic dysfunction). - Mitral valve: Calcified annulus.  Cardiac cath 06/17/16:  Ost Cx to Mid Cx lesion, 20 %stenosed.  Ost LM lesion, 40 %stenosed.  1st Diag lesion, 40 %stenosed.  The left ventricular systolic function is normal.  LV end diastolic pressure is normal.  The left ventricular ejection fraction is greater than 65% by visual estimate.  There is no mitral valve regurgitation.     EKG:  EKG is ordered today. The ekg ordered today demonstrates sinus brady, rate of 59 bpm. 1st degree AV block. RBBB. Inferior Q waves.   Recent Labs: 11/29/2018: ALT 23; BUN 22; Creatinine, Ser 1.56; Hemoglobin 14.8; Platelets 158.0; Potassium 4.1; Sodium 140; TSH 1.91   Lipid Panel    Component Value Date/Time   CHOL 175 11/29/2018 0935   TRIG 108.0 11/29/2018 0935   HDL 40.20 11/29/2018 0935   CHOLHDL 4 11/29/2018 0935   VLDL 21.6 11/29/2018 0935   LDLCALC 113 (H) 11/29/2018 0935     Wt Readings from Last 3 Encounters:  01/10/19 205 lb 6.4 oz (93.2 kg)  12/11/18 206 lb (93.4 kg)  08/28/18 209 lb (94.8 kg)     Other studies Reviewed: Additional studies/ records that were reviewed today include: . Review of the above records demonstrates:    Assessment and Plan:  1. CAD without angina: He has no chest pain. Cardiac cath March 2018 with mild to moderate non-obstructive CAD. Continue Plavix, statin and beta blocker.   2. HYPERTENSION:  BP is well controlled today but has been up around 140-145 at home. Will increase hydralazine to 50 mg po TID.    3. HYPERLIPIDEMIA: Lipids followed in primary care. He is tolerating Pravastatin. Most recent LDL 113. His Pravastatin was  increased in the primary care office.  Will add Zetia 10 mg. He has repeat lipids set up for November through Dr. [name]. If LDL not at goal of 70, can refer to lipid clinic to discuss Repatha or Praluent.   4. Chronic diastolic CHF: Weight is  stable. Volume status is ok today. Continue Lasix as needed.   Current medicines are reviewed at length with the patient today.  The patient does not have concerns regarding medicines.  The following changes have been made:  no change  Labs/ tests ordered today include:   Orders Placed This Encounter  Procedures  . EKG 12-Lead    Disposition:   F/U with me 12 months  Signed, Lauree Chandler, MD 01/10/2019 3:51 PM    [facility] [place] [place], [place], [place]  [address] Phone: [phone]; Fax: [phone]

## 2019-02-22 ENCOUNTER — Telehealth: Payer: Self-pay

## 2019-02-22 NOTE — Telephone Encounter (Signed)
Copied from [place] [id number]. Topic: General - Other >> Feb 20, 2019 10:59 AM Rainey Pines A wrote: Patient stated that he fell off ladder and scrapped skin off of his knee and wants to know if he can get an antibiotic prescribed. Please advise

## 2019-02-23 NOTE — Telephone Encounter (Signed)
Use over-the-counter antibiotic ointment and change dressings.  Office visit as soon as possible if the wound needs to be checked.  Thanks

## 2019-02-23 NOTE — Telephone Encounter (Signed)
LM notifying pt

## 2019-03-14 ENCOUNTER — Ambulatory Visit: Payer: Medicare Other | Admitting: Internal Medicine

## 2019-04-02 ENCOUNTER — Ambulatory Visit: Payer: Medicare Other | Admitting: Internal Medicine

## 2019-04-10 ENCOUNTER — Other Ambulatory Visit: Payer: Self-pay | Admitting: Internal Medicine

## 2019-04-12 MED ORDER — ALBUTEROL SULFATE HFA 108 (90 BASE) MCG/ACT IN AERS: 1.0000 | INHALATION_SPRAY | Freq: Four times a day (QID) | RESPIRATORY_TRACT | 2 refills | Status: DC | PRN

## 2019-04-12 MED ORDER — PANTOPRAZOLE SODIUM 40 MG PO TBEC: 40.0000 mg | DELAYED_RELEASE_TABLET | Freq: Every day | ORAL | 0 refills | Status: DC

## 2019-04-12 MED ORDER — BUDESONIDE-FORMOTEROL FUMARATE 160-4.5 MCG/ACT IN AERO: INHALATION_SPRAY | RESPIRATORY_TRACT | 0 refills | Status: DC

## 2019-04-12 MED ORDER — BUPROPION HCL ER (SR) 150 MG PO TB12: 150.0000 mg | ORAL_TABLET | Freq: Two times a day (BID) | ORAL | 0 refills | Status: DC

## 2019-04-17 ENCOUNTER — Ambulatory Visit: Payer: Medicare Other | Admitting: Internal Medicine

## 2019-05-01 ENCOUNTER — Ambulatory Visit: Payer: Medicare Other | Admitting: Internal Medicine

## 2019-05-16 ENCOUNTER — Ambulatory Visit: Payer: Medicare Other | Admitting: Internal Medicine

## 2019-05-21 ENCOUNTER — Ambulatory Visit: Payer: Medicare Other

## 2019-05-24 ENCOUNTER — Ambulatory Visit: Payer: Medicare Other

## 2019-05-27 ENCOUNTER — Other Ambulatory Visit: Payer: Self-pay | Admitting: Internal Medicine

## 2019-05-31 ENCOUNTER — Ambulatory Visit: Payer: Medicare Other

## 2019-06-05 ENCOUNTER — Ambulatory Visit: Payer: Medicare Other | Admitting: Internal Medicine

## 2019-06-18 ENCOUNTER — Ambulatory Visit: Payer: Medicare Other | Admitting: Internal Medicine

## 2019-06-19 ENCOUNTER — Other Ambulatory Visit (INDEPENDENT_AMBULATORY_CARE_PROVIDER_SITE_OTHER): Payer: Medicare Other

## 2019-06-19 DIAGNOSIS — I2510 Atherosclerotic heart disease of native coronary artery without angina pectoris: Secondary | ICD-10-CM

## 2019-06-19 DIAGNOSIS — E785 Hyperlipidemia, unspecified: Secondary | ICD-10-CM | POA: Diagnosis not present

## 2019-06-19 DIAGNOSIS — R739 Hyperglycemia, unspecified: Secondary | ICD-10-CM | POA: Diagnosis not present

## 2019-06-19 LAB — HEPATIC FUNCTION PANEL
ALT: 19 U/L (ref 0–53)
AST: 18 U/L (ref 0–37)
Albumin: 3.9 g/dL (ref 3.5–5.2)
Alkaline Phosphatase: 59 U/L (ref 39–117)
Bilirubin, Direct: 0.2 mg/dL (ref 0.0–0.3)
Total Bilirubin: 0.5 mg/dL (ref 0.2–1.2)
Total Protein: 6.8 g/dL (ref 6.0–8.3)

## 2019-06-19 LAB — BASIC METABOLIC PANEL
BUN: 30 mg/dL — ABNORMAL HIGH (ref 6–23)
CO2: 31 mEq/L (ref 19–32)
Calcium: 9.1 mg/dL (ref 8.4–10.5)
Chloride: 101 mEq/L (ref 96–112)
Creatinine, Ser: 1.53 mg/dL — ABNORMAL HIGH (ref 0.40–1.50)
GFR: 45.34 mL/min — ABNORMAL LOW (ref >60.00)
Glucose, Bld: 99 mg/dL (ref 70–99)
Potassium: 4.2 mEq/L (ref 3.5–5.1)
Sodium: 139 mEq/L (ref 135–145)

## 2019-06-19 LAB — LIPID PANEL
Cholesterol: 127 mg/dL (ref 0–200)
HDL: 42.9 mg/dL (ref >39.00)
LDL Cholesterol: 71 mg/dL (ref 0–99)
NonHDL: 83.71
Total CHOL/HDL Ratio: 3
Triglycerides: 63 mg/dL (ref 0.0–149.0)
VLDL: 12.6 mg/dL (ref 0.0–40.0)

## 2019-06-19 LAB — HEMOGLOBIN A1C: Hgb A1c MFr Bld: 5.9 % (ref 4.6–6.5)

## 2019-07-05 ENCOUNTER — Other Ambulatory Visit: Payer: Self-pay

## 2019-07-05 ENCOUNTER — Encounter: Payer: Self-pay | Admitting: Internal Medicine

## 2019-07-05 ENCOUNTER — Ambulatory Visit: Payer: Medicare Other | Admitting: Internal Medicine

## 2019-07-05 DIAGNOSIS — R600 Localized edema: Secondary | ICD-10-CM

## 2019-07-05 DIAGNOSIS — M25511 Pain in right shoulder: Secondary | ICD-10-CM

## 2019-07-05 DIAGNOSIS — I2510 Atherosclerotic heart disease of native coronary artery without angina pectoris: Secondary | ICD-10-CM

## 2019-07-05 DIAGNOSIS — N183 Chronic kidney disease, stage 3 unspecified: Secondary | ICD-10-CM

## 2019-07-05 DIAGNOSIS — R739 Hyperglycemia, unspecified: Secondary | ICD-10-CM | POA: Diagnosis not present

## 2019-07-05 LAB — BASIC METABOLIC PANEL
BUN: 29 mg/dL — ABNORMAL HIGH (ref 6–23)
CO2: 33 mEq/L — ABNORMAL HIGH (ref 19–32)
Calcium: 9.2 mg/dL (ref 8.4–10.5)
Chloride: 103 mEq/L (ref 96–112)
Creatinine, Ser: 1.6 mg/dL — ABNORMAL HIGH (ref 0.40–1.50)
GFR: 43.06 mL/min — ABNORMAL LOW (ref >60.00)
Glucose, Bld: 98 mg/dL (ref 70–99)
Potassium: 3.9 mEq/L (ref 3.5–5.1)
Sodium: 140 mEq/L (ref 135–145)

## 2019-07-05 MED ORDER — SILDENAFIL CITRATE 100 MG PO TABS: ORAL_TABLET | ORAL | 3 refills | Status: DC

## 2019-07-05 NOTE — Assessment & Plan Note (Signed)
Resolved on Maxzide 1/2 tab

## 2019-07-05 NOTE — Assessment & Plan Note (Signed)
Lab Results  Component Value Date   HGBA1C 5.9 06/19/2019

## 2019-07-05 NOTE — Assessment & Plan Note (Signed)
No angina Pravastatin

## 2019-07-05 NOTE — Progress Notes (Signed)
Subjective:  Patient ID: Larry Stone, male    DOB: 1950-04-27  Age: 69 y.o. MRN: [id number]  CC: No chief complaint on file.   HPI TEGAN DEMAN presents for CAD, anxiety, HTN f/u C/o LBP on the R w/ROM and lifting,and walking C/o R shoulder pain w/ROM  Outpatient Medications Prior to Visit  Medication Sig Dispense Refill  . albuterol (PROAIR HFA) 108 (90 Base) MCG/ACT inhaler Inhale 1-2 puffs into the lungs every 6 (six) hours as needed for wheezing or shortness of breath. 18 g 2  . albuterol (PROVENTIL) (2.5 MG/3ML) 0.083% nebulizer solution Use one vial in the nebulizer every 4-6 hours if needed for cough or wheeze 225 mL 11  . ALPRAZolam (XANAX) 0.5 MG tablet TAKE 1 TABLET BY MOUTH THREE TIMES A DAY AS NEEDED 90 tablet 3  . ammonium lactate (LAC-HYDRIN) 12 % lotion Apply topically as directed. 400 g 3  . budesonide-formoterol (SYMBICORT) 160-4.5 MCG/ACT inhaler INHALE 2 PUFFS INTO THE LUNGS 2 (TWO) TIMES DAILY. 3 Inhaler 0  . buPROPion (WELLBUTRIN SR) 150 MG 12 hr tablet Take 1 tablet (150 mg total) by mouth 2 (two) times daily. 180 tablet 0  . carvedilol (COREG) 25 MG tablet Take 1 tablet (25 mg total) by mouth 2 (two) times daily with a meal. Annual appt due in Febuary must see provider for future refills 180 tablet 0  . cetirizine (ZYRTEC) 10 MG tablet Take 1 tablet (10 mg total) by mouth daily. 30 tablet 11  . Cholecalciferol (VITAMIN D3) 5000 UNITS CAPS Take one tablet by mouth daily    . clopidogrel (PLAVIX) 75 MG tablet Take 1 tablet (75 mg total) by mouth daily. 90 tablet 3  . clotrimazole-betamethasone (LOTRISONE) cream APPLY AS DIRECTED 45 g 11  . Coenzyme Q10 (COQ10) 100 MG CAPS Take as directed 30 each 11  . ezetimibe (ZETIA) 10 MG tablet Take 1 tablet (10 mg total) by mouth daily. 90 tablet 3  . hydrALAZINE (APRESOLINE) 50 MG tablet Take 1 tablet (50 mg total) by mouth 3 (three) times daily. 270 tablet 3  . metFORMIN (GLUCOPHAGE) 500 MG tablet Take 1 tablet (500 mg  total) by mouth daily with breakfast. 90 tablet 3  . montelukast (SINGULAIR) 10 MG tablet TAKE 1 TABLET BY MOUTH EVERYDAY AT BEDTIME 90 tablet 1  . nitroGLYCERIN (NITROSTAT) 0.4 MG SL tablet PLACE 1 TABLET UNDER THE TONGUE EVERY 5 MINUTES AS NEEDED. FOR UP TO 3 DOSES. 25 tablet 5  . pantoprazole (PROTONIX) 40 MG tablet Take 1 tablet (40 mg total) by mouth daily. 90 tablet 0  . pravastatin (PRAVACHOL) 40 MG tablet Take 1 tablet (40 mg total) by mouth daily. 90 tablet 3  . sildenafil (VIAGRA) 100 MG tablet TAKE 1 TABLET BY MOUTH AS NEEDED FOR ERECTILE DYSFUNCTION. 30 tablet 3  . triamterene-hydrochlorothiazide (MAXZIDE) 75-50 MG tablet Take 0.5 tablets by mouth daily. 90 tablet 3  . vitamin C (ASCORBIC ACID) 500 MG tablet Take 500 mg by mouth daily.     No facility-administered medications prior to visit.    ROS: Review of Systems  Constitutional: Negative for appetite change, fatigue and unexpected weight change.  HENT: Negative for congestion, nosebleeds, sneezing, sore throat and trouble swallowing.   Eyes: Negative for itching and visual disturbance.  Respiratory: Negative for cough.   Cardiovascular: Negative for chest pain, palpitations and leg swelling.  Gastrointestinal: Negative for abdominal distention, blood in stool, diarrhea and nausea.  Genitourinary: Negative for frequency and hematuria.  Musculoskeletal: Negative for back pain, gait problem, joint swelling and neck pain.  Skin: Negative for rash.  Neurological: Negative for dizziness, tremors, speech difficulty and weakness.  Psychiatric/Behavioral: Negative for agitation, dysphoric mood and sleep disturbance. The patient is not nervous/anxious.     Objective:  BP 136/70 (BP Location: Left Arm, Patient Position: Sitting, Cuff Size: Large)   Pulse 68   Temp 98.1 F (36.7 C) (Oral)   Ht 5\' 9"  (1.753 m)   Wt 201 lb (91.2 kg)   SpO2 96%   BMI 29.68 kg/m   BP Readings from Last 3 Encounters:  07/05/19 136/70  01/10/19  114/70  12/11/18 (!) 142/74    Wt Readings from Last 3 Encounters:  07/05/19 201 lb (91.2 kg)  01/10/19 205 lb 6.4 oz (93.2 kg)  12/11/18 206 lb (93.4 kg)    Physical Exam Constitutional:      General: He is not in acute distress.    Appearance: He is well-developed.     Comments: NAD  Eyes:     Conjunctiva/sclera: Conjunctivae normal.     Pupils: Pupils are equal, round, and reactive to light.  Neck:     Thyroid: No thyromegaly.     Vascular: No JVD.  Cardiovascular:     Rate and Rhythm: Normal rate and regular rhythm.     Heart sounds: Normal heart sounds. No murmur. No friction rub. No gallop.   Pulmonary:     Effort: Pulmonary effort is normal. No respiratory distress.     Breath sounds: Normal breath sounds. No wheezing or rales.  Chest:     Chest wall: No tenderness.  Abdominal:     General: Bowel sounds are normal. There is no distension.     Palpations: Abdomen is soft. There is no mass.     Tenderness: There is no abdominal tenderness. There is no guarding or rebound.  Musculoskeletal:        General: No tenderness. Normal range of motion.     Cervical back: Normal range of motion.  Lymphadenopathy:     Cervical: No cervical adenopathy.  Skin:    General: Skin is warm and dry.     Findings: No rash.  Neurological:     Mental Status: He is alert and oriented to person, place, and time.     Cranial Nerves: No cranial nerve deficit.     Motor: No abnormal muscle tone.     Coordination: Coordination normal.     Gait: Gait normal.     Deep Tendon Reflexes: Reflexes are normal and symmetric.  Psychiatric:        Behavior: Behavior normal.        Thought Content: Thought content normal.        Judgment: Judgment normal.    R AC tender    Procedure :Joint Injection,   AC joint R   Indication: AC OA with refractory  chronic pain.   Risks including unsuccessful procedure , bleeding, infection, bruising, skin atrophy, "steroid flare-up" and others were  explained to the patient in detail as well as the benefits. Informed consent was obtained and signed.   Tthe patient was placed in a comfortable position. Vertical approach was used. Skin was prepped with Betadine and alcohol  and anesthetized with a cooling spray. Then, a 3 cc syringe with a 2 inch long 24-gauge needle was used for a joint injection.. The needle was advanced  Into the [facility] space and was injected with 1 mL of 2% lidocaine  and 20 mg of Depo-Medrol .  Band-Aid was applied.   Tolerated well. Complications: None. Good pain relief following the procedure.   Postprocedure instructions :    A Band-Aid should be left on for 12 hours. Injection therapy is not a cure itself. It is used in conjunction with other modalities. You can use nonsteroidal anti-inflammatories like ibuprofen , hot and cold compresses. Rest is recommended in the next 24 hours. You need to report immediately  if fever, chills or any signs of infection develop.    Lab Results  Component Value Date   WBC 9.2 11/29/2018   HGB 14.8 11/29/2018   HCT 43.6 11/29/2018   PLT 158.0 11/29/2018   GLUCOSE 99 06/19/2019   CHOL 127 06/19/2019   TRIG 63.0 06/19/2019   HDL 42.90 06/19/2019   LDLCALC 71 06/19/2019   ALT 19 06/19/2019   AST 18 06/19/2019   NA 139 06/19/2019   K 4.2 06/19/2019   CL 101 06/19/2019   CREATININE 1.53 (H) 06/19/2019   BUN 30 (H) 06/19/2019   CO2 31 06/19/2019   TSH 1.91 11/29/2018   PSA 0.44 11/29/2018   INR 1.0 06/09/2016   HGBA1C 5.9 06/19/2019    DG Chest 2 View  Result Date: 04/03/2018 CLINICAL DATA:  69 y/o M; fall landing onto the left upper arm with pain to the mid left posterior ribs. EXAM: CHEST - 2 VIEW COMPARISON:  08/20/2016 chest radiograph FINDINGS: Stable heart size and mediastinal contours are within normal limits. Both lungs are clear. No acute fracture identified. IMPRESSION: No active cardiopulmonary disease. No acute fracture identified. If clinically indicated, consider  dedicated rib radiographs. Electronically Signed   By: Kristine Garbe M.D.   On: 04/03/2018 14:14    Assessment & Plan:   There are no diagnoses linked to this encounter.   No orders of the defined types were placed in this encounter.    Follow-up: No follow-ups on file.  [name], MD

## 2019-07-05 NOTE — Patient Instructions (Addendum)
Voltaren gel 

## 2019-07-05 NOTE — Assessment & Plan Note (Addendum)
US renal BP control Hydration

## 2019-07-06 ENCOUNTER — Other Ambulatory Visit: Payer: Self-pay | Admitting: Internal Medicine

## 2019-07-06 NOTE — Progress Notes (Signed)
  I was I was thanks no complaints with 8

## 2019-07-11 ENCOUNTER — Ambulatory Visit
Admission: RE | Admit: 2019-07-11 | Discharge: 2019-07-11 | Disposition: A | Discharge status: Home / Self Care | Payer: Medicare Other | Source: Ambulatory Visit | Attending: Internal Medicine | Admitting: Internal Medicine

## 2019-07-11 DIAGNOSIS — N183 Chronic kidney disease, stage 3 unspecified: Secondary | ICD-10-CM

## 2019-07-13 ENCOUNTER — Other Ambulatory Visit: Payer: Self-pay | Admitting: Internal Medicine

## 2019-07-13 DIAGNOSIS — N183 Chronic kidney disease, stage 3 unspecified: Secondary | ICD-10-CM

## 2019-07-18 ENCOUNTER — Other Ambulatory Visit: Payer: Self-pay | Admitting: Internal Medicine

## 2019-07-28 ENCOUNTER — Other Ambulatory Visit: Payer: Self-pay | Admitting: Internal Medicine

## 2019-08-08 ENCOUNTER — Other Ambulatory Visit: Payer: Self-pay | Admitting: Internal Medicine

## 2019-08-08 NOTE — Telephone Encounter (Signed)
Please refill as per office routine med refill policy (all routine meds refilled for 3 mo or monthly per pt preference up to one year from last visit, then month to month grace period for 3 mo, then further med refills will have to be denied)  

## 2019-08-16 ENCOUNTER — Ambulatory Visit: Payer: Medicare Other | Admitting: Internal Medicine

## 2019-08-23 ENCOUNTER — Telehealth: Payer: Self-pay

## 2019-08-23 DIAGNOSIS — N183 Chronic kidney disease, stage 3 unspecified: Secondary | ICD-10-CM

## 2019-08-23 NOTE — Telephone Encounter (Signed)
New message    The patient asking for a fasting blood work order to be entered into the system before appt on  5.11.2021

## 2019-08-23 NOTE — Telephone Encounter (Signed)
Labs ordered.

## 2019-08-28 ENCOUNTER — Ambulatory Visit: Payer: Medicare Other | Admitting: Internal Medicine

## 2019-08-31 ENCOUNTER — Other Ambulatory Visit (INDEPENDENT_AMBULATORY_CARE_PROVIDER_SITE_OTHER): Payer: Medicare Other

## 2019-08-31 ENCOUNTER — Other Ambulatory Visit: Payer: Self-pay

## 2019-08-31 DIAGNOSIS — N183 Chronic kidney disease, stage 3 unspecified: Secondary | ICD-10-CM

## 2019-08-31 LAB — CBC WITH DIFFERENTIAL/PLATELET
Basophils Absolute: 0.1 10*3/uL (ref 0.0–0.1)
Basophils Relative: 0.8 % (ref 0.0–3.0)
Eosinophils Absolute: 0.4 10*3/uL (ref 0.0–0.7)
Eosinophils Relative: 5.3 % — ABNORMAL HIGH (ref 0.0–5.0)
HCT: 42 % (ref 39.0–52.0)
Hemoglobin: 14.1 g/dL (ref 13.0–17.0)
Lymphocytes Relative: 24.4 % (ref 12.0–46.0)
Lymphs Abs: 1.9 10*3/uL (ref 0.7–4.0)
MCHC: 33.6 g/dL (ref 30.0–36.0)
MCV: 96.1 fl (ref 78.0–100.0)
Monocytes Absolute: 0.9 10*3/uL (ref 0.1–1.0)
Monocytes Relative: 11.5 % (ref 3.0–12.0)
Neutro Abs: 4.6 10*3/uL (ref 1.4–7.7)
Neutrophils Relative %: 58 % (ref 43.0–77.0)
Platelets: 146 10*3/uL — ABNORMAL LOW (ref 150.0–400.0)
RBC: 4.37 Mil/uL (ref 4.22–5.81)
RDW: 12.8 % (ref 11.5–15.5)
WBC: 7.9 10*3/uL (ref 4.0–10.5)

## 2019-08-31 LAB — BASIC METABOLIC PANEL
BUN: 16 mg/dL (ref 6–23)
CO2: 29 mEq/L (ref 19–32)
Calcium: 8.9 mg/dL (ref 8.4–10.5)
Chloride: 104 mEq/L (ref 96–112)
Creatinine, Ser: 1.35 mg/dL (ref 0.40–1.50)
GFR: 52.36 mL/min — ABNORMAL LOW (ref >60.00)
Glucose, Bld: 94 mg/dL (ref 70–99)
Potassium: 4.3 mEq/L (ref 3.5–5.1)
Sodium: 138 mEq/L (ref 135–145)

## 2019-08-31 LAB — HEPATIC FUNCTION PANEL
ALT: 18 U/L (ref 0–53)
AST: 18 U/L (ref 0–37)
Albumin: 3.9 g/dL (ref 3.5–5.2)
Alkaline Phosphatase: 69 U/L (ref 39–117)
Bilirubin, Direct: 0.1 mg/dL (ref 0.0–0.3)
Total Bilirubin: 0.5 mg/dL (ref 0.2–1.2)
Total Protein: 6.3 g/dL (ref 6.0–8.3)

## 2019-09-04 ENCOUNTER — Other Ambulatory Visit: Payer: Self-pay

## 2019-09-04 ENCOUNTER — Ambulatory Visit: Payer: Medicare Other | Admitting: Internal Medicine

## 2019-09-04 ENCOUNTER — Encounter: Payer: Self-pay | Admitting: Internal Medicine

## 2019-09-04 VITALS — BP 132/74 | HR 65 | Temp 98.0°F | Ht 69.0 in | Wt 205.0 lb

## 2019-09-04 DIAGNOSIS — N183 Chronic kidney disease, stage 3 unspecified: Secondary | ICD-10-CM

## 2019-09-04 DIAGNOSIS — E785 Hyperlipidemia, unspecified: Secondary | ICD-10-CM

## 2019-09-04 DIAGNOSIS — R739 Hyperglycemia, unspecified: Secondary | ICD-10-CM | POA: Diagnosis not present

## 2019-09-04 DIAGNOSIS — I10 Essential (primary) hypertension: Secondary | ICD-10-CM | POA: Diagnosis not present

## 2019-09-04 DIAGNOSIS — I2510 Atherosclerotic heart disease of native coronary artery without angina pectoris: Secondary | ICD-10-CM | POA: Diagnosis not present

## 2019-09-04 NOTE — Progress Notes (Signed)
Subjective:  Patient ID: Larry Stone, male    DOB: 1950-11-14  Age: 69 y.o. MRN: [id number]  CC: No chief complaint on file.   HPI Larry Stone presents for CRI, HTN, anxiety Off HCTZ  Outpatient Medications Prior to Visit  Medication Sig Dispense Refill  . albuterol (PROAIR HFA) 108 (90 Base) MCG/ACT inhaler Inhale 1-2 puffs into the lungs every 6 (six) hours as needed for wheezing or shortness of breath. 18 g 2  . albuterol (PROVENTIL) (2.5 MG/3ML) 0.083% nebulizer solution Use one vial in the nebulizer every 4-6 hours if needed for cough or wheeze 225 mL 11  . ALPRAZolam (XANAX) 0.5 MG tablet TAKE 1 TABLET BY MOUTH THREE TIMES A DAY AS NEEDED 90 tablet 1  . ammonium lactate (LAC-HYDRIN) 12 % lotion APPLY TOPICALLY AS DIRECTED. 400 mL 3  . budesonide-formoterol (SYMBICORT) 160-4.5 MCG/ACT inhaler INHALE 2 PUFFS INTO THE LUNGS 2 (TWO) TIMES DAILY. 3 Inhaler 0  . buPROPion (WELLBUTRIN SR) 150 MG 12 hr tablet Take 1 tablet (150 mg total) by mouth 2 (two) times daily. 180 tablet 0  . carvedilol (COREG) 25 MG tablet TAKE 1 TAB BY MOUTH 2 TIMES DAILY WITH A MEAL.ANNUAL APPT IN FEBUARY MUST SEE PROVIDER FOR REFILLS 180 tablet 3  . cetirizine (ZYRTEC) 10 MG tablet Take 1 tablet (10 mg total) by mouth daily. 30 tablet 11  . Cholecalciferol (VITAMIN D3) 5000 UNITS CAPS Take one tablet by mouth daily    . clopidogrel (PLAVIX) 75 MG tablet Take 1 tablet (75 mg total) by mouth daily. 90 tablet 3  . clotrimazole-betamethasone (LOTRISONE) cream APPLY AS DIRECTED 45 g 11  . Coenzyme Q10 (COQ10) 100 MG CAPS Take as directed 30 each 11  . ezetimibe (ZETIA) 10 MG tablet Take 1 tablet (10 mg total) by mouth daily. 90 tablet 3  . hydrALAZINE (APRESOLINE) 50 MG tablet Take 1 tablet (50 mg total) by mouth 3 (three) times daily. 270 tablet 3  . metFORMIN (GLUCOPHAGE) 500 MG tablet Take 1 tablet (500 mg total) by mouth daily with breakfast. 90 tablet 3  . montelukast (SINGULAIR) 10 MG tablet TAKE 1  TABLET BY MOUTH EVERYDAY AT BEDTIME 90 tablet 1  . nitroGLYCERIN (NITROSTAT) 0.4 MG SL tablet PLACE 1 TABLET UNDER THE TONGUE EVERY 5 MINUTES AS NEEDED. FOR UP TO 3 DOSES. 25 tablet 5  . pantoprazole (PROTONIX) 40 MG tablet TAKE 1 TABLET BY MOUTH EVERY DAY 90 tablet 3  . pravastatin (PRAVACHOL) 40 MG tablet Take 1 tablet (40 mg total) by mouth daily. 90 tablet 3  . sildenafil (VIAGRA) 100 MG tablet TAKE 1 TABLET BY MOUTH AS NEEDED FOR ERECTILE DYSFUNCTION. 30 tablet 3  . vitamin C (ASCORBIC ACID) 500 MG tablet Take 500 mg by mouth daily.     No facility-administered medications prior to visit.    ROS: Review of Systems  Constitutional: Negative for appetite change, fatigue and unexpected weight change.  HENT: Negative for congestion, nosebleeds, sneezing, sore throat and trouble swallowing.   Eyes: Negative for itching and visual disturbance.  Respiratory: Negative for cough.   Cardiovascular: Negative for chest pain, palpitations and leg swelling.  Gastrointestinal: Negative for abdominal distention, blood in stool, diarrhea and nausea.  Genitourinary: Negative for frequency and hematuria.  Musculoskeletal: Negative for back pain, gait problem, joint swelling and neck pain.  Skin: Negative for rash.  Neurological: Negative for dizziness, tremors, speech difficulty and weakness.  Psychiatric/Behavioral: Negative for agitation, dysphoric mood and sleep disturbance. The  patient is not nervous/anxious.     Objective:  BP 132/74 (BP Location: Left Arm, Patient Position: Sitting, Cuff Size: Large)   Pulse 65   Temp 98 F (36.7 C) (Oral)   Ht 5\' 9"  (1.753 m)   Wt 205 lb (93 kg)   SpO2 99%   BMI 30.27 kg/m   BP Readings from Last 3 Encounters:  09/04/19 132/74  07/05/19 136/70  01/10/19 114/70    Wt Readings from Last 3 Encounters:  09/04/19 205 lb (93 kg)  07/05/19 201 lb (91.2 kg)  01/10/19 205 lb 6.4 oz (93.2 kg)    Physical Exam Constitutional:      General: He is not  in acute distress.    Appearance: He is well-developed.     Comments: NAD  Eyes:     Conjunctiva/sclera: Conjunctivae normal.     Pupils: Pupils are equal, round, and reactive to light.  Neck:     Thyroid: No thyromegaly.     Vascular: No JVD.  Cardiovascular:     Rate and Rhythm: Normal rate and regular rhythm.     Heart sounds: Normal heart sounds. No murmur. No friction rub. No gallop.   Pulmonary:     Effort: Pulmonary effort is normal. No respiratory distress.     Breath sounds: Normal breath sounds. No wheezing or rales.  Chest:     Chest wall: No tenderness.  Abdominal:     General: Bowel sounds are normal. There is no distension.     Palpations: Abdomen is soft. There is no mass.     Tenderness: There is no abdominal tenderness. There is no guarding or rebound.  Musculoskeletal:        General: No tenderness. Normal range of motion.     Cervical back: Normal range of motion.  Lymphadenopathy:     Cervical: No cervical adenopathy.  Skin:    General: Skin is warm and dry.     Findings: No rash.  Neurological:     Mental Status: He is alert and oriented to person, place, and time.     Cranial Nerves: No cranial nerve deficit.     Motor: No abnormal muscle tone.     Coordination: Coordination normal.     Gait: Gait normal.     Deep Tendon Reflexes: Reflexes are normal and symmetric.  Psychiatric:        Behavior: Behavior normal.        Thought Content: Thought content normal.        Judgment: Judgment normal.     Lab Results  Component Value Date   WBC 7.9 08/31/2019   HGB 14.1 08/31/2019   HCT 42.0 08/31/2019   PLT 146.0 (L) 08/31/2019   GLUCOSE 94 08/31/2019   CHOL 127 06/19/2019   TRIG 63.0 06/19/2019   HDL 42.90 06/19/2019   LDLCALC 71 06/19/2019   ALT 18 08/31/2019   AST 18 08/31/2019   NA 138 08/31/2019   K 4.3 08/31/2019   CL 104 08/31/2019   CREATININE 1.35 08/31/2019   BUN 16 08/31/2019   CO2 29 08/31/2019   TSH 1.91 11/29/2018   PSA 0.44  11/29/2018   INR 1.0 06/09/2016   HGBA1C 5.9 06/19/2019    US Renal  Result Date: 07/11/2019 CLINICAL DATA:  Chronic renal disease EXAM: RENAL / URINARY TRACT ULTRASOUND COMPLETE COMPARISON:  None. FINDINGS: Right Kidney: Renal measurements: 9.7 x 4.7 x 5.1 cm. = volume: 120 mL . Echogenicity within normal limits. No mass or  hydronephrosis visualized. Left Kidney: Renal measurements: 10.5 x 5.2 x 4.3 cm. = volume: 124 mL. Echogenicity within normal limits. No mass or hydronephrosis visualized. Bladder: Appears normal for degree of bladder distention. Other: None. IMPRESSION: Normal appearing kidneys bilaterally. Electronically Signed   By: Inez Catalina M.D.   On: 07/11/2019 16:11    Assessment & Plan:    [name], MD

## 2019-09-04 NOTE — Assessment & Plan Note (Signed)
Better off diuretics Coreg, Hydralazine

## 2019-09-04 NOTE — Assessment & Plan Note (Signed)
No CP 

## 2019-09-04 NOTE — Assessment & Plan Note (Signed)
Coreg, Hydralazine 

## 2019-09-04 NOTE — Assessment & Plan Note (Signed)
Pravastatin  

## 2019-09-07 ENCOUNTER — Other Ambulatory Visit: Payer: Self-pay | Admitting: Physician Assistant

## 2019-09-18 ENCOUNTER — Other Ambulatory Visit: Payer: Self-pay | Admitting: Internal Medicine

## 2019-10-30 ENCOUNTER — Other Ambulatory Visit: Payer: Self-pay | Admitting: Internal Medicine

## 2019-11-08 ENCOUNTER — Other Ambulatory Visit: Payer: Self-pay | Admitting: Cardiovascular Disease

## 2019-12-04 ENCOUNTER — Emergency Department (HOSPITAL_COMMUNITY)
Admission: EM | Admit: 2019-12-04 | Discharge: 2019-12-04 | Disposition: A | Discharge status: Home / Self Care | Payer: Medicare Other | Attending: Emergency Medicine | Admitting: Emergency Medicine

## 2019-12-04 ENCOUNTER — Ambulatory Visit: Payer: Medicare Other | Admitting: Internal Medicine

## 2019-12-04 ENCOUNTER — Encounter (HOSPITAL_COMMUNITY): Payer: Self-pay

## 2019-12-04 ENCOUNTER — Other Ambulatory Visit: Payer: Self-pay

## 2019-12-04 ENCOUNTER — Ambulatory Visit (INDEPENDENT_AMBULATORY_CARE_PROVIDER_SITE_OTHER)
Admission: RE | Admit: 2019-12-04 | Discharge: 2019-12-04 | Disposition: A | Discharge status: Home / Self Care | Payer: Medicare Other | Source: Ambulatory Visit | Attending: Internal Medicine | Admitting: Internal Medicine

## 2019-12-04 ENCOUNTER — Encounter: Payer: Self-pay | Admitting: Internal Medicine

## 2019-12-04 VITALS — BP 158/78 | HR 60 | Temp 97.7°F | Ht 69.0 in | Wt 200.0 lb

## 2019-12-04 DIAGNOSIS — R202 Paresthesia of skin: Secondary | ICD-10-CM | POA: Insufficient documentation

## 2019-12-04 DIAGNOSIS — R531 Weakness: Secondary | ICD-10-CM | POA: Diagnosis not present

## 2019-12-04 DIAGNOSIS — G8191 Hemiplegia, unspecified affecting right dominant side: Secondary | ICD-10-CM | POA: Insufficient documentation

## 2019-12-04 DIAGNOSIS — R4781 Slurred speech: Secondary | ICD-10-CM | POA: Diagnosis not present

## 2019-12-04 DIAGNOSIS — R29818 Other symptoms and signs involving the nervous system: Secondary | ICD-10-CM | POA: Diagnosis not present

## 2019-12-04 DIAGNOSIS — Z5321 Procedure and treatment not carried out due to patient leaving prior to being seen by health care provider: Secondary | ICD-10-CM | POA: Diagnosis not present

## 2019-12-04 LAB — CBC
HCT: 41.1 % (ref 39.0–52.0)
Hemoglobin: 13.4 g/dL (ref 13.0–17.0)
MCH: 32.1 pg (ref 26.0–34.0)
MCHC: 32.6 g/dL (ref 30.0–36.0)
MCV: 98.3 fL (ref 80.0–100.0)
Platelets: 170 10*3/uL (ref 150–400)
RBC: 4.18 MIL/uL — ABNORMAL LOW (ref 4.22–5.81)
RDW: 12.6 % (ref 11.5–15.5)
WBC: 8.3 10*3/uL (ref 4.0–10.5)
nRBC: 0 % (ref 0.0–0.2)

## 2019-12-04 LAB — COMPREHENSIVE METABOLIC PANEL
ALT: 20 U/L (ref 0–44)
AST: 20 U/L (ref 15–41)
Albumin: 3.6 g/dL (ref 3.5–5.0)
Alkaline Phosphatase: 58 U/L (ref 38–126)
Anion gap: 10 (ref 5–15)
BUN: 17 mg/dL (ref 8–23)
CO2: 25 mmol/L (ref 22–32)
Calcium: 8.8 mg/dL — ABNORMAL LOW (ref 8.9–10.3)
Chloride: 105 mmol/L (ref 98–111)
Creatinine, Ser: 1.32 mg/dL — ABNORMAL HIGH (ref 0.61–1.24)
GFR calc Af Amer: >60 mL/min (ref >60)
GFR calc non Af Amer: 55 mL/min — ABNORMAL LOW (ref >60)
Glucose, Bld: 95 mg/dL (ref 70–99)
Potassium: 4.3 mmol/L (ref 3.5–5.1)
Sodium: 140 mmol/L (ref 135–145)
Total Bilirubin: 1 mg/dL (ref 0.3–1.2)
Total Protein: 6 g/dL — ABNORMAL LOW (ref 6.5–8.1)

## 2019-12-04 LAB — DIFFERENTIAL
Abs Immature Granulocytes: 0.04 10*3/uL (ref 0.00–0.07)
Basophils Absolute: 0.1 10*3/uL (ref 0.0–0.1)
Basophils Relative: 1 %
Eosinophils Absolute: 0.7 10*3/uL — ABNORMAL HIGH (ref 0.0–0.5)
Eosinophils Relative: 8 %
Immature Granulocytes: 1 %
Lymphocytes Relative: 15 %
Lymphs Abs: 1.3 10*3/uL (ref 0.7–4.0)
Monocytes Absolute: 0.9 10*3/uL (ref 0.1–1.0)
Monocytes Relative: 11 %
Neutro Abs: 5.4 10*3/uL (ref 1.7–7.7)
Neutrophils Relative %: 64 %

## 2019-12-04 LAB — I-STAT CHEM 8, ED
BUN: 20 mg/dL (ref 8–23)
Calcium, Ion: 1.15 mmol/L (ref 1.15–1.40)
Chloride: 104 mmol/L (ref 98–111)
Creatinine, Ser: 1.3 mg/dL — ABNORMAL HIGH (ref 0.61–1.24)
Glucose, Bld: 85 mg/dL (ref 70–99)
HCT: 38 % — ABNORMAL LOW (ref 39.0–52.0)
Hemoglobin: 12.9 g/dL — ABNORMAL LOW (ref 13.0–17.0)
Potassium: 4.2 mmol/L (ref 3.5–5.1)
Sodium: 141 mmol/L (ref 135–145)
TCO2: 26 mmol/L (ref 22–32)

## 2019-12-04 LAB — PROTIME-INR
INR: 1.1 (ref 0.8–1.2)
Prothrombin Time: 13.5 seconds (ref 11.4–15.2)

## 2019-12-04 LAB — APTT: aPTT: 30 seconds (ref 24–36)

## 2019-12-04 LAB — ETHANOL: Alcohol, Ethyl (B): <10 mg/dL (ref <10)

## 2019-12-04 NOTE — ED Triage Notes (Signed)
Patient arrived by [facility] complaining of right sided numbness and tingling since 4pm yesterday. On arrival no arm drift, speech clear and denies pain. States that he feels as if numb when you have dental visit and novocaine administered. VAN negative.

## 2019-12-04 NOTE — Assessment & Plan Note (Addendum)
Probable CVA Head CT STAT RTC 1 wk To ER if worse ER notes, labs reviewed Hydrate well   Per Robinson, [place] N, PA-C 12/04/19 1304 1:09 PM "Discussed with Dr. Leonel Ramsay with neurology. Agrees with NOT initiating code stroke. Recommends adding on CTA head and neck. Appreciate consult."

## 2019-12-04 NOTE — Progress Notes (Signed)
Subjective:  Patient ID: Larry Stone, male    DOB: Mar 02, 1951  Age: 69 y.o. MRN: [id number]  CC: No chief complaint on file.   HPI [name] presents for CVA sx's since 5 pm yesterday. Pt went to ER by ambulance at 11 am. He left ER just now to see me, because he had to wait in the crowded waiting room. [place] - [place] R face - numb R hand weak R foot dragging  Outpatient Medications Prior to Visit  Medication Sig Dispense Refill  . albuterol (PROAIR HFA) 108 (90 Base) MCG/ACT inhaler Inhale 1-2 puffs into the lungs every 6 (six) hours as needed for wheezing or shortness of breath. 18 g 2  . albuterol (PROVENTIL) (2.5 MG/3ML) 0.083% nebulizer solution Use one vial in the nebulizer every 4-6 hours if needed for cough or wheeze 225 mL 11  . ALPRAZolam (XANAX) 0.5 MG tablet TAKE 1 TABLET BY MOUTH THREE TIMES A DAY AS NEEDED 90 tablet 1  . ammonium lactate (LAC-HYDRIN) 12 % lotion APPLY TOPICALLY AS DIRECTED. 400 mL 3  . budesonide-formoterol (SYMBICORT) 160-4.5 MCG/ACT inhaler INHALE 2 PUFFS INTO THE LUNGS 2 (TWO) TIMES DAILY. 3 Inhaler 0  . buPROPion (WELLBUTRIN SR) 150 MG 12 hr tablet Take 1 tablet (150 mg total) by mouth 2 (two) times daily. 180 tablet 0  . carvedilol (COREG) 25 MG tablet TAKE 1 TAB BY MOUTH 2 TIMES DAILY WITH A MEAL.ANNUAL APPT IN FEBUARY MUST SEE PROVIDER FOR REFILLS 180 tablet 3  . cetirizine (ZYRTEC) 10 MG tablet Take 1 tablet (10 mg total) by mouth daily. 30 tablet 11  . Cholecalciferol (VITAMIN D3) 5000 UNITS CAPS Take one tablet by mouth daily    . clopidogrel (PLAVIX) 75 MG tablet Take 1 tablet (75 mg total) by mouth daily. 90 tablet 0  . clotrimazole-betamethasone (LOTRISONE) cream APPLY AS DIRECTED 45 g 11  . Coenzyme Q10 (COQ10) 100 MG CAPS Take as directed 30 each 11  . ezetimibe (ZETIA) 10 MG tablet Take 1 tablet (10 mg total) by mouth daily. 90 tablet 3  . hydrALAZINE (APRESOLINE) 50 MG tablet Take 1 tablet  (50 mg total) by mouth 3 (three) times daily. 270 tablet 3  . metFORMIN (GLUCOPHAGE) 500 MG tablet Take 1 tablet (500 mg total) by mouth daily with breakfast. 90 tablet 3  . montelukast (SINGULAIR) 10 MG tablet TAKE 1 TABLET BY MOUTH EVERYDAY AT BEDTIME 90 tablet 3  . nitroGLYCERIN (NITROSTAT) 0.4 MG SL tablet PLACE 1 TABLET UNDER THE TONGUE EVERY 5 MINUTES AS NEEDED. FOR UP TO 3 DOSES. 25 tablet 2  . pravastatin (PRAVACHOL) 40 MG tablet Take 1 tablet (40 mg total) by mouth daily. 90 tablet 3  . sildenafil (VIAGRA) 100 MG tablet TAKE 1 TABLET BY MOUTH AS NEEDED FOR ERECTILE DYSFUNCTION. 30 tablet 3  . vitamin C (ASCORBIC ACID) 500 MG tablet Take 500 mg by mouth daily.    . pantoprazole (PROTONIX) 40 MG tablet TAKE 1 TABLET BY MOUTH EVERY DAY 90 tablet 3   No facility-administered medications prior to visit.    ROS: Review of Systems  Constitutional: Negative for appetite change, fatigue and unexpected weight change.  HENT: Negative for congestion, nosebleeds, sneezing, sore throat and trouble swallowing.   Eyes: Negative for itching and visual disturbance.  Respiratory: Negative for cough.   Cardiovascular: Negative for chest pain, palpitations and leg swelling.  Gastrointestinal: Negative for abdominal distention, blood in  stool, diarrhea and nausea.  Genitourinary: Negative for frequency and hematuria.  Musculoskeletal: Positive for gait problem. Negative for back pain, joint swelling and neck pain.  Skin: Negative for rash.  Neurological: Positive for facial asymmetry and weakness. Negative for dizziness, tremors, speech difficulty and headaches.  Psychiatric/Behavioral: Negative for agitation, dysphoric mood, sleep disturbance and suicidal ideas. The patient is not nervous/anxious.     Objective:  BP (!) 158/78 (BP Location: Left Arm, Patient Position: Sitting, Cuff Size: Large)   Pulse 60   Temp 97.7 F (36.5 C) (Oral)   Ht 5\' 9"  (1.753 m)   Wt 200 lb (90.7 kg)   SpO2 97%    BMI 29.53 kg/m   BP Readings from Last 3 Encounters:  12/04/19 (!) 158/78  12/04/19 (!) 154/77  09/04/19 132/74    Wt Readings from Last 3 Encounters:  12/04/19 200 lb (90.7 kg)  12/04/19 200 lb (90.7 kg)  09/04/19 205 lb (93 kg)    Physical Exam Constitutional:      General: He is not in acute distress.    Appearance: He is well-developed.     Comments: NAD  Eyes:     Conjunctiva/sclera: Conjunctivae normal.     Pupils: Pupils are equal, round, and reactive to light.  Neck:     Thyroid: No thyromegaly.     Vascular: No JVD.  Cardiovascular:     Rate and Rhythm: Normal rate and regular rhythm.     Heart sounds: Normal heart sounds. No murmur heard.  No friction rub. No gallop.   Pulmonary:     Effort: Pulmonary effort is normal. No respiratory distress.     Breath sounds: Normal breath sounds. No wheezing or rales.  Chest:     Chest wall: No tenderness.  Abdominal:     General: Bowel sounds are normal. There is no distension.     Palpations: Abdomen is soft. There is no mass.     Tenderness: There is no abdominal tenderness. There is no guarding or rebound.  Musculoskeletal:        General: No tenderness. Normal range of motion.     Cervical back: Normal range of motion.  Lymphadenopathy:     Cervical: No cervical adenopathy.  Skin:    General: Skin is warm and dry.     Findings: No rash.  Neurological:     Mental Status: He is alert and oriented to person, place, and time.     Cranial Nerves: No cranial nerve deficit.     Motor: Weakness present. No abnormal muscle tone.     Coordination: Coordination abnormal.     Gait: Gait abnormal.     Deep Tendon Reflexes: Reflexes are normal and symmetric.  Psychiatric:        Behavior: Behavior normal.        Thought Content: Thought content normal.        Judgment: Judgment normal.   mild R facial droop Slurred speech No pron drift Tripping R foot  Lab Results  Component Value Date   WBC 8.3 12/04/2019   HGB  12.9 (L) 12/04/2019   HCT 38.0 (L) 12/04/2019   PLT 170 12/04/2019   GLUCOSE 85 12/04/2019   CHOL 127 06/19/2019   TRIG 63.0 06/19/2019   HDL 42.90 06/19/2019   LDLCALC 71 06/19/2019   ALT 20 12/04/2019   AST 20 12/04/2019   NA 141 12/04/2019   K 4.2 12/04/2019   CL 104 12/04/2019   CREATININE 1.30 (H) 12/04/2019  BUN 20 12/04/2019   CO2 25 12/04/2019   TSH 1.91 11/29/2018   PSA 0.44 11/29/2018   INR 1.1 12/04/2019   HGBA1C 5.9 06/19/2019    No results found.  Assessment & Plan:    [name], MD

## 2019-12-04 NOTE — Patient Instructions (Signed)
Go to ER if worse 

## 2019-12-04 NOTE — ED Provider Notes (Addendum)
MSE was initiated and I personally evaluated the patient and placed orders (if any) at  1:00 PM on December 04, 2019.   BP (!) 154/77 (BP Location: Right Arm)   Pulse (!) 59   Temp 98.3 F (36.8 C) (Oral)   Resp 18   Ht 5\' 9"  (1.753 m)   Wt 90.7 kg   SpO2 96%   BMI 29.53 kg/m   Patient presenting with sudden onset of right face, arm, leg numbness and weakness that began between 4-5pm yesterday. He reports he is unable to walk or write normally due to the symptoms. He states he feels as if his right face/arm/leg have had novocaine similar to when one is at the dentist. He denies vision changes, speech difficulty, facial droop, headache.   Neuro exam: Mental Status:  Alert, oriented, thought content appropriate, able to give a coherent history. Speech fluent without evidence of aphasia. Able to follow 2 step commands without difficulty.  Cranial Nerves:  II:  Peripheral visual fields grossly normal, pupils equal, round, reactive to light III,IV, VI: ptosis not present, extra-ocular motions intact bilaterally  V,VII: smile symmetric, facial light touch sensation equal VIII: hearing grossly normal to voice  X: uvula elevates symmetrically  XI: bilateral shoulder shrug symmetric and strong XII: midline tongue extension without fassiculations Motor:  Normal tone. 5/5 strength in upper and lower extremities bilaterally including strong and equal grip strength and dorsiflexion/plantar flexion Sensory: grossly normal in all extremities.  Cerebellar: ABNORMAL coordination with finger-to-nose with right upper extremity and heel-to-shin RLE Gait: ATAXIC GAIT  Discussed with attending physician, Dr. Sedonia Small. Agrees patient is VAN negative at this time. Workup initiated.    The patient appears stable so that the remainder of the MSE may be completed by another provider.   Rosanna Bickle, [place] N, PA-C 12/04/19 1304   1:09 PM Discussed with Dr. Leonel Ramsay with neurology. Agrees with NOT initiating  code stroke. Recommends adding on CTA head and neck. Appreciate consult.   Laketta Soderberg, [place] N, PA-C 12/04/19 1310    Maudie Flakes, MD 12/04/19 [phone]

## 2019-12-04 NOTE — ED Notes (Signed)
Per registration, pt is leaving

## 2019-12-05 ENCOUNTER — Telehealth: Payer: Self-pay

## 2019-12-05 ENCOUNTER — Encounter: Payer: Self-pay | Admitting: Internal Medicine

## 2019-12-05 ENCOUNTER — Ambulatory Visit: Payer: Medicare Other | Admitting: Internal Medicine

## 2019-12-05 ENCOUNTER — Telehealth: Payer: Self-pay | Admitting: Internal Medicine

## 2019-12-05 DIAGNOSIS — I639 Cerebral infarction, unspecified: Secondary | ICD-10-CM | POA: Insufficient documentation

## 2019-12-05 DIAGNOSIS — Z8673 Personal history of transient ischemic attack (TIA), and cerebral infarction without residual deficits: Secondary | ICD-10-CM | POA: Insufficient documentation

## 2019-12-05 NOTE — Telephone Encounter (Signed)
New message    The CT report is back noted on mychart was told by another MD to get an appt with Dr. Bronwen Betters as soon as the results are back today or tomorrow.   Aware of next week appt  Please advise

## 2019-12-05 NOTE — Telephone Encounter (Signed)
Pt's wife also stated the the [place] office doesn't have anything for the pt to get in soon. Pt's wife would like the referrals sent to [facility] for the Speech and Physical Therapy.

## 2019-12-05 NOTE — Telephone Encounter (Signed)
   Spouse calling to discuss therapy

## 2019-12-05 NOTE — Telephone Encounter (Signed)
New message:   Pt's wife is calling and states Dr. Camila Li wanted the pt to be seen as soon as he had the Ct Scan done. Pt's wife also states that she thinks the pt needs a OT referral for his hands that he can barely open and close. Please advise.

## 2019-12-06 ENCOUNTER — Ambulatory Visit: Payer: Medicare Other | Attending: Internal Medicine | Admitting: Physical Therapy

## 2019-12-06 ENCOUNTER — Ambulatory Visit: Payer: Medicare Other | Admitting: Internal Medicine

## 2019-12-06 ENCOUNTER — Encounter: Payer: Self-pay | Admitting: Internal Medicine

## 2019-12-06 ENCOUNTER — Other Ambulatory Visit: Payer: Self-pay

## 2019-12-06 ENCOUNTER — Ambulatory Visit: Payer: Medicare Other | Admitting: Speech Pathology

## 2019-12-06 DIAGNOSIS — R1312 Dysphagia, oropharyngeal phase: Secondary | ICD-10-CM | POA: Insufficient documentation

## 2019-12-06 DIAGNOSIS — R413 Other amnesia: Secondary | ICD-10-CM

## 2019-12-06 DIAGNOSIS — I6381 Other cerebral infarction due to occlusion or stenosis of small artery: Secondary | ICD-10-CM

## 2019-12-06 DIAGNOSIS — R278 Other lack of coordination: Secondary | ICD-10-CM | POA: Diagnosis present

## 2019-12-06 DIAGNOSIS — I69351 Hemiplegia and hemiparesis following cerebral infarction affecting right dominant side: Secondary | ICD-10-CM | POA: Diagnosis present

## 2019-12-06 DIAGNOSIS — N183 Chronic kidney disease, stage 3 unspecified: Secondary | ICD-10-CM

## 2019-12-06 DIAGNOSIS — R2689 Other abnormalities of gait and mobility: Secondary | ICD-10-CM | POA: Diagnosis present

## 2019-12-06 DIAGNOSIS — I10 Essential (primary) hypertension: Secondary | ICD-10-CM

## 2019-12-06 DIAGNOSIS — F411 Generalized anxiety disorder: Secondary | ICD-10-CM

## 2019-12-06 DIAGNOSIS — R471 Dysarthria and anarthria: Secondary | ICD-10-CM | POA: Diagnosis present

## 2019-12-06 DIAGNOSIS — I639 Cerebral infarction, unspecified: Secondary | ICD-10-CM

## 2019-12-06 DIAGNOSIS — R209 Unspecified disturbances of skin sensation: Secondary | ICD-10-CM | POA: Insufficient documentation

## 2019-12-06 DIAGNOSIS — R260 Ataxic gait: Secondary | ICD-10-CM | POA: Diagnosis present

## 2019-12-06 DIAGNOSIS — R2681 Unsteadiness on feet: Secondary | ICD-10-CM | POA: Insufficient documentation

## 2019-12-06 DIAGNOSIS — M6281 Muscle weakness (generalized): Secondary | ICD-10-CM | POA: Diagnosis present

## 2019-12-06 NOTE — Assessment & Plan Note (Signed)
No change 

## 2019-12-06 NOTE — Assessment & Plan Note (Signed)
Coreg, Hydralazine 

## 2019-12-06 NOTE — Assessment & Plan Note (Signed)
PT/OT Speech ther next wk MRI Neurol ref

## 2019-12-06 NOTE — Patient Instructions (Signed)
Signs of swallowing difficulty: If you notice coughing or choking, or breathing difficulties with meals, let your doctor know. Based on my clinical assessment of your swallowing, I don't feel you need further imaging of your swallowing unless you have difficulties mentioned above.   Signs of Aspiration Pneumonia   . Chest pain/tightness . Fever (can be low grade) . Cough  o With foul-smelling phlegm (sputum) o With sputum containing pus or blood o With greenish sputum . Fatigue  . Shortness of breath  . Wheezing   **IF YOU HAVE THESE SIGNS, CONTACT YOUR DOCTOR OR GO TO THE EMERGENCY DEPARTMENT OR URGENT CARE AS SOON AS POSSIBLE**    For clearer speech:   S   Slow L   Loud O  Overarticulate P   Pause

## 2019-12-06 NOTE — Progress Notes (Signed)
Subjective:  Patient ID: Larry Stone, male    DOB: Nov 17, 1950  Age: 69 y.o. MRN: [id number]  CC: No chief complaint on file.   HPI Larry Stone presents for CVA, HTN, R hemiparesis - no new sx's Numbness is better   Outpatient Medications Prior to Visit  Medication Sig Dispense Refill  . albuterol (PROAIR HFA) 108 (90 Base) MCG/ACT inhaler Inhale 1-2 puffs into the lungs every 6 (six) hours as needed for wheezing or shortness of breath. 18 g 2  . albuterol (PROVENTIL) (2.5 MG/3ML) 0.083% nebulizer solution Use one vial in the nebulizer every 4-6 hours if needed for cough or wheeze 225 mL 11  . ALPRAZolam (XANAX) 0.5 MG tablet TAKE 1 TABLET BY MOUTH THREE TIMES A DAY AS NEEDED 90 tablet 1  . ammonium lactate (LAC-HYDRIN) 12 % lotion APPLY TOPICALLY AS DIRECTED. 400 mL 3  . budesonide-formoterol (SYMBICORT) 160-4.5 MCG/ACT inhaler INHALE 2 PUFFS INTO THE LUNGS 2 (TWO) TIMES DAILY. 3 Inhaler 0  . buPROPion (WELLBUTRIN SR) 150 MG 12 hr tablet Take 1 tablet (150 mg total) by mouth 2 (two) times daily. 180 tablet 0  . carvedilol (COREG) 25 MG tablet TAKE 1 TAB BY MOUTH 2 TIMES DAILY WITH A MEAL.ANNUAL APPT IN FEBUARY MUST SEE PROVIDER FOR REFILLS 180 tablet 3  . cetirizine (ZYRTEC) 10 MG tablet Take 1 tablet (10 mg total) by mouth daily. 30 tablet 11  . Cholecalciferol (VITAMIN D3) 5000 UNITS CAPS Take one tablet by mouth daily    . clopidogrel (PLAVIX) 75 MG tablet Take 1 tablet (75 mg total) by mouth daily. 90 tablet 0  . clotrimazole-betamethasone (LOTRISONE) cream APPLY AS DIRECTED 45 g 11  . Coenzyme Q10 (COQ10) 100 MG CAPS Take as directed 30 each 11  . ezetimibe (ZETIA) 10 MG tablet Take 1 tablet (10 mg total) by mouth daily. 90 tablet 3  . hydrALAZINE (APRESOLINE) 50 MG tablet Take 1 tablet (50 mg total) by mouth 3 (three) times daily. 270 tablet 3  . metFORMIN (GLUCOPHAGE) 500 MG tablet Take 1 tablet (500 mg total) by mouth daily with breakfast. 90 tablet 3  . montelukast  (SINGULAIR) 10 MG tablet TAKE 1 TABLET BY MOUTH EVERYDAY AT BEDTIME 90 tablet 3  . nitroGLYCERIN (NITROSTAT) 0.4 MG SL tablet PLACE 1 TABLET UNDER THE TONGUE EVERY 5 MINUTES AS NEEDED. FOR UP TO 3 DOSES. 25 tablet 2  . pravastatin (PRAVACHOL) 40 MG tablet Take 1 tablet (40 mg total) by mouth daily. 90 tablet 3  . sildenafil (VIAGRA) 100 MG tablet TAKE 1 TABLET BY MOUTH AS NEEDED FOR ERECTILE DYSFUNCTION. 30 tablet 3  . vitamin C (ASCORBIC ACID) 500 MG tablet Take 500 mg by mouth daily.    . pantoprazole (PROTONIX) 40 MG tablet TAKE 1 TABLET BY MOUTH EVERY DAY 90 tablet 3   No facility-administered medications prior to visit.    ROS: Review of Systems  Constitutional: Positive for fatigue. Negative for appetite change and unexpected weight change.  HENT: Negative for congestion, nosebleeds, sneezing, sore throat and trouble swallowing.   Eyes: Negative for itching and visual disturbance.  Respiratory: Negative for cough.   Cardiovascular: Negative for chest pain, palpitations and leg swelling.  Gastrointestinal: Negative for abdominal distention, blood in stool, diarrhea and nausea.  Genitourinary: Negative for frequency and hematuria.  Musculoskeletal: Negative for back pain, gait problem, joint swelling and neck pain.  Skin: Negative for rash.  Neurological: Positive for speech difficulty, weakness and numbness. Negative for dizziness  and tremors.  Psychiatric/Behavioral: Negative for agitation, dysphoric mood, sleep disturbance and suicidal ideas. The patient is nervous/anxious.     Objective:  BP 116/70   Pulse 73   Temp 98.3 F (36.8 C) (Oral)   Ht 5\' 9"  (1.753 m)   Wt 197 lb (89.4 kg)   SpO2 97%   BMI 29.09 kg/m   BP Readings from Last 3 Encounters:  12/06/19 116/70  12/04/19 (!) 158/78  12/04/19 (!) 154/77    Wt Readings from Last 3 Encounters:  12/06/19 197 lb (89.4 kg)  12/04/19 200 lb (90.7 kg)  12/04/19 200 lb (90.7 kg)    Physical Exam Constitutional:       General: He is not in acute distress.    Appearance: He is well-developed.     Comments: NAD  Eyes:     Conjunctiva/sclera: Conjunctivae normal.     Pupils: Pupils are equal, round, and reactive to light.  Neck:     Thyroid: No thyromegaly.     Vascular: No JVD.  Cardiovascular:     Rate and Rhythm: Normal rate and regular rhythm.     Heart sounds: Normal heart sounds. No murmur heard.  No friction rub. No gallop.   Pulmonary:     Effort: Pulmonary effort is normal. No respiratory distress.     Breath sounds: Normal breath sounds. No wheezing or rales.  Chest:     Chest wall: No tenderness.  Abdominal:     General: Bowel sounds are normal. There is no distension.     Palpations: Abdomen is soft. There is no mass.     Tenderness: There is no abdominal tenderness. There is no guarding or rebound.  Musculoskeletal:        General: No tenderness. Normal range of motion.     Cervical back: Normal range of motion.  Lymphadenopathy:     Cervical: No cervical adenopathy.  Skin:    General: Skin is warm and dry.     Findings: No rash.  Neurological:     Mental Status: He is alert and oriented to person, place, and time.     Cranial Nerves: No cranial nerve deficit.     Sensory: Sensory deficit present.     Motor: Weakness present. No abnormal muscle tone.     Coordination: Coordination abnormal.     Gait: Gait abnormal.     Deep Tendon Reflexes: Reflexes are normal and symmetric.  Psychiatric:        Behavior: Behavior normal.        Thought Content: Thought content normal.        Judgment: Judgment normal.   in a w/c Neuro - no change from 2 d ago  Lab Results  Component Value Date   WBC 8.3 12/04/2019   HGB 12.9 (L) 12/04/2019   HCT 38.0 (L) 12/04/2019   PLT 170 12/04/2019   GLUCOSE 85 12/04/2019   CHOL 127 06/19/2019   TRIG 63.0 06/19/2019   HDL 42.90 06/19/2019   LDLCALC 71 06/19/2019   ALT 20 12/04/2019   AST 20 12/04/2019   NA 141 12/04/2019   K 4.2 12/04/2019    CL 104 12/04/2019   CREATININE 1.30 (H) 12/04/2019   BUN 20 12/04/2019   CO2 25 12/04/2019   TSH 1.91 11/29/2018   PSA 0.44 11/29/2018   INR 1.1 12/04/2019   HGBA1C 5.9 06/19/2019    CT Head Wo Contrast  Result Date: 12/04/2019 CLINICAL DATA:  Acute neuro deficit.  Right-sided numbness  EXAM: CT HEAD WITHOUT CONTRAST TECHNIQUE: Contiguous axial images were obtained from the base of the skull through the vertex without intravenous contrast. COMPARISON:  None. FINDINGS: Brain: Ventricle size normal.  Cerebral volume normal for age. Small hypodensity left thalamus compatible with infarct of indeterminate age. Negative for hemorrhage or mass. Vascular: Negative for hyperdense vessel Skull: Negative skull. Sinuses/Orbits: Paranasal sinuses clear.  Negative orbit Other: None IMPRESSION: Small hypodensity left thalamus compatible with infarct of indeterminate age. Negative for hemorrhage. Electronically Signed   By: Franchot Gallo M.D.   On: 12/04/2019 16:36    Assessment & Plan:   There are no diagnoses linked to this encounter.   No orders of the defined types were placed in this encounter.    Follow-up: No follow-ups on file.  [name], MD

## 2019-12-06 NOTE — Addendum Note (Signed)
Addended by: Karle Barr on: 12/06/2019 09:22 AM   Modules accepted: Orders

## 2019-12-06 NOTE — Telephone Encounter (Signed)
Noted. Pt has OV today to discuss.

## 2019-12-06 NOTE — Assessment & Plan Note (Signed)
BP Readings from Last 3 Encounters:  12/06/19 116/70  12/04/19 (!) 158/78  12/04/19 (!) 154/77

## 2019-12-06 NOTE — Therapy (Signed)
[facility] [address] [place], [place], [address] Phone: [phone]   Fax:  [phone]  Speech Language Pathology Evaluation  Patient Details  Name: Larry Stone MRN: [id number] Date of Birth: 24-Jan-1951 Referring Provider (SLP): Plotnikov, Debarah Crape, MD   Encounter Date: 12/06/2019   End of Session - 12/06/19 1711    Visit Number 1    Number of Visits 1    Date for SLP Re-Evaluation 12/06/19    SLP Start Time 3329    SLP Stop Time  1655    SLP Time Calculation (min) 40 min    Activity Tolerance Patient tolerated treatment well           Past Medical History:  Diagnosis Date  . Anxiety   . CAD    a. acute inferior lateral wall infarction in September 2004 treated medically. b.  cath 06/17/16 showing mild nonobstructive CAD with 30-40% ostial LM (eccentric), elevated LV filling pressures and normal LV function.  . Chronic diastolic CHF (congestive heart failure) ([place])   . CKD (chronic kidney disease), stage II   . COPD (chronic obstructive pulmonary disease) ([place])   . Diabetes mellitus   . Diverticulosis   . DJD (degenerative joint disease)   . GERD (gastroesophageal reflux disease)   . HTN (hypertension)   . Hyperlipidemia   . Low back pain syndrome   . Myocardial infarction ([place]) 2004  . Obesity   . OSA (obstructive sleep apnea)   . RBBB   . Recurrent aspiration bronchitis/pneumonia   . Recurrent aspiration pneumonia ([place])    [name] 07/13/2016  . Thrombocytopenia ([place])   . Tubular adenoma of colon 2014    Past Surgical History:  Procedure Laterality Date  . CARDIAC CATHETERIZATION    . LEFT HEART CATH AND CORONARY ANGIOGRAPHY N/A 06/17/2016   Procedure: Left Heart Cath and Coronary Angiography;  Surgeon: Burnell Blanks, MD;  Location: [facility];  Service: Cardiovascular;  Laterality: N/A;  . UVULOPALATOPHARYNGOPLASTY     surgery for OSA    There were no vitals filed for  this visit.   Subjective Assessment - 12/06/19 1621    Subjective "Right side of my face is number and my lips are the same way."    Patient is accompained by: Family member   wife             SLP Evaluation [facility] - 12/06/19 1701      SLP Visit Information   SLP Received On 12/06/19    Referring Provider (SLP) Plotnikov, Debarah Crape, MD    Onset Date 12/04/19    Medical Diagnosis CVA      Subjective   Subjective has bilateral hearing loss, not wearing aids today    Patient/Family Stated Goal evaluate speech and swallowing      Pain Assessment   Currently in Pain? No/denies      General Information   HPI Larry Stone is a 69 yo male referred by his primary care MD for evaluation of CVA. Pt presented with right sided numbness and weakness on 12/04/19 to ED; left due to long wait. His PCP ordered CT head which showed "small hypodensity left thalamus compatible with infarct of indeterminate age." Has not been evaluated by neurology. Wife reported slurred speech they report has largely resolved.    Behavioral/Cognition alert, pleasant, cooperative    Mobility Status ambulated without device, unsteady      Balance Screen   Has the patient fallen  in the past 6 months --   PT eval today     Prior Functional Status   Cognitive/Linguistic Baseline Baseline deficits    Baseline deficit details wife reports slower auditory processing premorbidly      Cognition   Overall Cognitive Status Within Functional Limits for tasks assessed   29/30 MMSE     Auditory Comprehension   Overall Auditory Comprehension Appears within functional limits for tasks assessed   repetition needed for high frequency sounds due to [place] Within Functional Limits    Conversation Moderately complex    Interfering Components Hearing      Visual Recognition/Discrimination   Discrimination Not tested      Reading Comprehension   Reading Status Within funtional limits      Expression   Primary Mode  of Expression Verbal      Verbal Expression   Overall Verbal Expression Appears within functional limits for tasks assessed      Written Expression   Written Expression Within Functional Limits   language WFL, handwriting is impaired. has OT eval scheduled     Oral Motor/Sensory Function   Overall Oral Motor/Sensory Function Impaired    Labial ROM Within Functional Limits    Labial Symmetry --   minimal right asymmetry   Labial Strength Within Functional Limits    Labial Sensation Reduced Right    Labial Coordination WFL    Lingual ROM Within Functional Limits    Lingual Symmetry Within Functional Limits    Lingual Strength Within Functional Limits    Lingual Sensation Within Functional Limits    Lingual Coordination WFL    Facial ROM Within Functional Limits    Facial Symmetry Within Functional Limits    Facial Sensation Other (Comment)   feeling of "fullness" right side     Motor Speech   Overall Motor Speech Appears within functional limits for tasks assessed    Respiration Within functional limits    Phonation Other (comment)   rough vocal quality, premorbid   Resonance Within functional limits    Articulation Within functional limitis    Intelligibility Intelligible    Motor Planning [place] functional limits      Standardized Assessments   Standardized Assessments  Other Assessment       Patient presents with oropharyngeal swallow which appears clinically to be within functional limits with adequate airway protection. No overt signs of aspiration observed despite challenging with consecutive straw sips of thin liquids in excess of 3oz. Mastication, oral control and clearance adequate.                      SLP Education - 12/06/19 1711    Education Details signs of dysphagia, signs of aspiration pneumonia, intelligibility compensations    Person(s) Educated Patient;Spouse    Methods Explanation    Comprehension Verbalized understanding                 Plan - 12/06/19 1712    Clinical Impression Statement Mr. Walkowiak presents with swallowing, language and cognitive skills within functional limits. Wife concerned for SLP to assess swallowing since pt has facial numbness. He has bitten his lip a few times when chewing. Pt denies coughing or choking related to swallowing; wife reports pt did cough once when eating chicken last night. No other reported difficulties. He passed 3oz water swallow challenge without difficulty, no overt signs of aspiration with thin liquids or regular solids. Speech is 100% intelligible and pt reports  he is at his baseline. Verbal expression and auditory comprehension were functional for mod complex conversation, reading comprehension intact at paragraph level, writing [facility] for sentences and personal information. Pt scored 29/30 on Mini Mental State Examination. Denies changes in memory or cognitive function. At this time no further skilled ST indicated; SLP educated on signs of dysphagia and aspiration PNA, and provided instructions to contact physician if difficulty noted.    Speech Therapy Frequency One time visit    Duration --   evaluation only   Treatment/Interventions Aspiration precaution training;Compensatory strategies;Trials of upgraded texture/liquids;Diet toleration management by SLP;Patient/family education    Potential to Achieve Goals Good    Consulted and Agree with Plan of Care Patient;Family member/caregiver           Patient will benefit from skilled therapeutic intervention in order to improve the following deficits and impairments:   Dysarthria and anarthria  Dysphagia, oropharyngeal phase    Problem List Patient Active Problem List   Diagnosis Date Noted  . Cerebrovascular accident (CVA) of left thalamus ([place]) 12/05/2019  . Right hemiparesis ([place]) 12/04/2019  . Chronic renal insufficiency, stage 3 (moderate) 07/05/2019  . Mild cognitive disorder 10/12/2017  . Viral pneumonitis  07/22/2016  . COPD with asthma ([place]) 07/22/2016  . Depression with anxiety 07/13/2016  . Acute respiratory failure with hypoxia ([place]) 07/13/2016  . Chronic diastolic CHF (congestive heart failure) ([place]) 07/13/2016  . Sepsis, unspecified organism ([place]) 07/13/2016  . Acute pain of right knee 06/30/2016  . Fatigue 06/30/2016  . Creatinine elevation 06/30/2016  . Dyspnea on exertion   . COPD with acute exacerbation ([place]) 04/04/2015  . Elevated IgE level 03/18/2014  . Well adult exam 03/05/2014  . COPD with chronic bronchitis ([place]) 12/13/2013  . Venous insufficiency 11/03/2011  . Edema 11/03/2011  . Memory disturbance 05/13/2011  . CAD (coronary artery disease) 09/17/2010  . 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 Incidental pulmonary nodule 06/17/2009  . COLONIC POLYPS 10/17/2007  . BRONCHITIS, RECURRENT 10/17/2007  . GERD (gastroesophageal reflux disease) 10/17/2007  . Diverticulosis of large intestine 10/17/2007  . DEGENERATIVE JOINT DISEASE 10/17/2007  . Hyperglycemia 10/17/2007  . Dyslipidemia 10/02/2007  . Anxiety state 10/02/2007  . Obstructive sleep apnea 10/02/2007  . Essential hypertension 10/02/2007  . LOW BACK PAIN SYNDROME 10/02/2007   Deneise Lever, [place], [place] 12/06/2019, 5:21 PM  [facility] [address] [place], [place], [address] Phone: [phone]   Fax:  [phone]  Name: PINCHAS REITHER MRN: [id number] Date of Birth: [birth year]

## 2019-12-07 ENCOUNTER — Encounter: Payer: Self-pay | Admitting: Physical Therapy

## 2019-12-07 NOTE — Therapy (Signed)
[facility] [address] [place], [place], [address] Phone: [phone]   Fax:  [phone]  Physical Therapy Evaluation  Patient Details  Name: Larry Stone MRN: [id number] Date of Birth: 12-11-1950 Referring Provider (PT): Dr. Lew Dawes   Encounter Date: 12/06/2019   PT End of Session - 12/07/19 1050    Visit Number 1    Number of Visits 13   eval to 12 visits   Authorization Type [facility] Medicare    Authorization Time Period 12-06-19 - 03-04-20    PT Start Time 1445    PT Stop Time 1530    PT Time Calculation (min) 45 min    Activity Tolerance Patient tolerated treatment well    Behavior During Therapy [facility] for tasks assessed/performed           Past Medical History:  Diagnosis Date  . Anxiety   . CAD    a. acute inferior lateral wall infarction in September 2004 treated medically. b.  cath 06/17/16 showing mild nonobstructive CAD with 30-40% ostial LM (eccentric), elevated LV filling pressures and normal LV function.  . Chronic diastolic CHF (congestive heart failure) ([place])   . CKD (chronic kidney disease), stage II   . COPD (chronic obstructive pulmonary disease) ([place])   . Diabetes mellitus   . Diverticulosis   . DJD (degenerative joint disease)   . GERD (gastroesophageal reflux disease)   . HTN (hypertension)   . Hyperlipidemia   . Low back pain syndrome   . Myocardial infarction ([place]) 2004  . Obesity   . OSA (obstructive sleep apnea)   . RBBB   . Recurrent aspiration bronchitis/pneumonia   . Recurrent aspiration pneumonia ([place])    Larry Stone 07/13/2016  . Thrombocytopenia ([place])   . Tubular adenoma of colon 2014    Past Surgical History:  Procedure Laterality Date  . CARDIAC CATHETERIZATION    . LEFT HEART CATH AND CORONARY ANGIOGRAPHY N/A 06/17/2016   Procedure: Left Heart Cath and Coronary Angiography;  Surgeon: Burnell Blanks, MD;  Location: [facility];  Service: Cardiovascular;   Laterality: N/A;  . UVULOPALATOPHARYNGOPLASTY     surgery for OSA    There were no vitals filed for this visit.    Subjective Assessment - 12/06/19 1447    Subjective Pt reports he  started having weakness, lack of muscle control, tingling and numbness on Rt side of body approx. 5:00 pm on Monday, 12-03-19:  EMS was called at 11:00 the next morning due to pt's symptoms not having resolved;  Pt was told he had 6 hour wait in ED (they decided not to call code stroke but did order CT of head and neck) ;  CT revealed infarct in Lt thalamus  but wife reports it is unknown if infarct was old or new:  they called his PCP and pt was seen that pm by Dr. [name].  MRI and neurology consult was ordered today by PCP - MRI  to be scheduled    Patient is accompained by: Family member   wife [facility]   Pertinent History s/p Lt  thalamic CVA:  Anxiety, essential HTN, DJD, CAD, COPD with chronic bronchitis, chronic diaastolic CHF, h/o MI in 2703: 06-17-16 L heart catheterization & coroncay angiography    Diagnostic tests CT scan done ; MRI to be scheduled    Patient Stated Goals Improve balance and walking    Currently in Pain? No/denies  [facility] PT Assessment - 12/07/19 0001      Assessment   Medical Diagnosis Lt thalamic CVA    Referring Provider (PT) Dr. Tyrone Apple Plotnikov    Onset Date/Surgical Date 12/03/19    Hand Dominance Right    Prior Therapy none      Precautions   Precautions Fall      Balance Screen   Has the patient fallen in the past 6 months No    Has the patient had a decrease in activity level because of a fear of falling?  No    Is the patient reluctant to leave their home because of a fear of falling?  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       ROM / Strength   AROM / PROM / Strength Strength      Transfers   Transfers Sit to Stand;Stand to [employer]    Number of Reps Other reps (comment)   2   Comments some unsteadiness noted but pt able to recover; 2nd rep improved compared to 1st  rep      Ambulation/Gait   Ambulation/Gait Yes    Ambulation/Gait Assistance 5: Supervision    Ambulation Distance (Feet) 115 Feet   2 reps - 1 with SPC and 1 rpe no device   Assistive device 4-wheeled walker;Right platform walker    Gait Pattern Ataxic   mild ataxia Rt foot   Ambulation Surface Level;Indoor    Stairs Yes    Stairs Assistance 5: Supervision    Stair Management Technique Two rails;Alternating pattern;Forwards    Number of Stairs 4    Height of Stairs 6      Standardized Balance Assessment   Standardized Balance Assessment Berg Balance Test;Timed Up and Go Test      Berg Balance Test   Sit to Stand Able to stand without using hands and stabilize independently    Standing Unsupported Able to stand safely 2 minutes    Sitting with Back Unsupported but Feet Supported on Floor or Stool Able to sit safely and securely 2 minutes    Stand to Sit Sits safely with minimal use of hands    Transfers Able to transfer safely, minor use of hands    Standing Unsupported with Eyes Closed Able to stand 10 seconds with supervision    Standing Unsupported with Feet Together Able to place feet together independently and stand for 1 minute with supervision    From Standing, Reach Forward with Outstretched Arm Can reach confidently >25 cm (10")    From Standing Position, Pick up Object from Floor Able to pick up shoe, needs supervision    From Standing Position, Turn to Look Behind  Over each Shoulder Turn sideways only but maintains balance    Turn 360 Degrees Able to turn 360 degrees safely but slowly    Standing Unsupported, Alternately Place Feet on Step/Stool  Able to complete >2 steps/needs minimal assist    Standing Unsupported, One Foot in Front Able to plae foot ahead of the other independently and hold 30 seconds    Standing on One Leg Tries to lift leg/unable to hold 3 seconds but remains standing independently    Total Score 42    Berg comment: pt states he is having to focus and concentrate in order to perform tasks successfully       Timed Up and Go Test   Normal TUG (seconds) 12.92   no device   TUG Comments mild unsteadiness with turning          FOTO not captured by front office      Coordination - heel to shin 10.2 secs with LLE:  10.8 secs RLE       Objective measurements completed on examination: See above findings.     Pt gait trained with SPC 115' but cane was not beneficial - pt was more unsteady due to incorrect sequence with cane and pt stating he was distracted by using it          PT Education - 12/07/19 1048    Education Details pt instructed to perform coordination exercise in standing - touching targets on floor with Rt foot; walking as much as tolerated in home SLOWLY and use support on objects prn (SPC not beneficial during today's eval)    Person(s) Educated Spouse    Methods Explanation;Demonstration    Comprehension Verbalized understanding            PT Short Term Goals - 12/07/19 1118      PT SHORT TERM GOAL #1   Title Improve Berg balance test score from 46/56 to >/= 49/56 to reduce fall risk.    Baseline 46/56    Time 3   extended due to delay in start of treatment   Period Weeks    Status New    Target Date 12/28/19      PT SHORT TERM GOAL #2   Title Pt will amb. 500' with no device on flat even surface with supervision.    Time 3    Period Weeks    Status New    Target Date 12/28/19       PT SHORT TERM GOAL #3   Title Increase gait velocity by at least .4 ft/sec for incr. gait efficiency.    Baseline TBA    Time 3    Period Weeks    Status New    Target Date 12/28/19      PT SHORT TERM GOAL #4   Title Independent in HEP for balance and coordination exercises.    Time 3    Status New    Target Date 12/28/19             PT Long Term Goals - 12/07/19 1108      PT LONG TERM GOAL #1   Title Improve Berg balance score to >/= 52/56 to reduce fall risk.    Baseline 46/56 on 12-06-19    Time 6    Period Weeks    Status New    Target Date 01/18/20   extended 1 week due to delay in start of treatment due to decr. schedule availability     PT LONG TERM GOAL #2   Title Pt will amb. 1000' on flat even and uneven surface without device modified independently with pt demonstrating ability to look ahead rather than down at feet.  Time 6    Status New    Target Date 01/18/20      PT LONG TERM GOAL #3   Title Pt will report negotiating steps at home to 2nd level (rather than using chair lift) modified independently.    Time 6    Period Weeks    Status New    Target Date 01/18/20      PT LONG TERM GOAL #4   Title Independent in updated HEP for balance and coordination exercises.    Time 6    Period Weeks    Status New    Target Date 01/18/20      PT LONG TERM GOAL #5   Title Improve FGA score by at least 5 points to increase safety with gait and to reduce fall risk.    Baseline TBA    Time 6    Period Weeks    Status New    Target Date 01/18/20                  Plan - 12/07/19 1054    Clinical Impression Statement Pt is a 69 yr old gentleman s/p Lt thalamic CVA on 12-03-19.  Pt presents with high level balance and coordination deficits and mildly ataxic gait pattern.  Pt was transported by EMS to [facility] ED on 12-04-19, however, code stroke was not called.  Pt states he was advised to go to his PCP due to extended wait time in ED.  MRI and neurology consult  to be scheduled.  Pt presents to PT eval in clinic's wheelchair due to fatigue, with pt stating he did not sleep well last night and he has had busy morning.  (Pt amb. out of clinci without use of wheelchair).  Pt's heel to shin time is 10 secs bil. LE's with decreased coordination noted in RLE compared to LLE.  Pt's Berg score is 46/56 and Tug score is 12.9 secs.  Pt will benefit from PT to address gait and balance deficits, coordination and motor control RLE  Strength in RLE is WNL's. .    Personal Factors and Comorbidities Behavior Pattern;Comorbidity 2    Comorbidities anxiety, HTN, DJD, CAD, COPD with chronic bronchitis, chronic diastolic CHF, 06-24-73 lt heart cath. & coronary angiography, s/p MI 2004    Examination-Activity Limitations Bend;Stairs;Squat;Locomotion Level;Transfers    Examination-Participation Restrictions Cleaning;Shop;[employer] aid/development worker work activities   Stability/Clinical Decision Making Evolving/Moderate complexity    Rehab Potential Good    PT Frequency 2x / week    PT Duration 6 weeks    PT Treatment/Interventions ADLs/Self Care Home Management;DME Instruction;Gait training;Stair training;Therapeutic activities;Therapeutic exercise;Balance training;Neuromuscular re-education;Patient/family education    PT Next Visit Plan do FGA; begin balance HEP (was instructed verbally to do coordination ex. of touching targets on floor with RLE in standing);coordioation ex. RLE:  gait training without device    PT Home Exercise Plan coordination ex. RLE in standing (12-06-19)    Recommended Other Services Pt has OT eval scheduled (ST not warranted per eval on 12-06-19)    Consulted and Agree with Plan of Care Patient;Family member/caregiver    Family Member Consulted spouse Stanton Kidney           Patient will benefit from skilled therapeutic intervention in order to improve the following deficits and impairments:  Abnormal gait, Decreased activity tolerance,  Decreased balance, Decreased coordination, Decreased strength, Impaired sensation  Visit Diagnosis: Ataxic gait - Plan: PT plan of care cert/re-cert  Hemiplegia and hemiparesis  following cerebral infarction affecting right dominant side ([place]) - Plan: PT plan of care cert/re-cert  Other abnormalities of gait and mobility - Plan: PT plan of care cert/re-cert  Decreased coordination - Plan: PT plan of care cert/re-cert  Unsteadiness on feet - Plan: PT plan of care cert/re-cert     Problem List Patient Active Problem List   Diagnosis Date Noted  . Cerebrovascular accident (CVA) of left thalamus ([place]) 12/05/2019  . Right hemiparesis ([place]) 12/04/2019  . Chronic renal insufficiency, stage 3 (moderate) 07/05/2019  . Mild cognitive disorder 10/12/2017  . Viral pneumonitis 07/22/2016  . COPD with asthma ([place]) 07/22/2016  . Depression with anxiety 07/13/2016  . Acute respiratory failure with hypoxia ([place]) 07/13/2016  . Chronic diastolic CHF (congestive heart failure) ([place]) 07/13/2016  . Sepsis, unspecified organism ([place]) 07/13/2016  . Acute pain of right knee 06/30/2016  . Fatigue 06/30/2016  . Creatinine elevation 06/30/2016  . Dyspnea on exertion   . COPD with acute exacerbation ([place]) 04/04/2015  . Elevated IgE level 03/18/2014  . Well adult exam 03/05/2014  . COPD with chronic bronchitis ([place]) 12/13/2013  . Venous insufficiency 11/03/2011  . Edema 11/03/2011  . Memory disturbance 05/13/2011  . CAD (coronary artery disease) 09/17/2010  . 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 Incidental pulmonary nodule 06/17/2009  . COLONIC POLYPS 10/17/2007  . BRONCHITIS, RECURRENT 10/17/2007  . GERD (gastroesophageal reflux disease) 10/17/2007  . Diverticulosis of large intestine 10/17/2007  . DEGENERATIVE JOINT DISEASE 10/17/2007  . Hyperglycemia 10/17/2007  . Dyslipidemia 10/02/2007  . Anxiety state 10/02/2007  . Obstructive sleep apnea 10/02/2007  . Essential hypertension 10/02/2007    . LOW BACK PAIN SYNDROME 10/02/2007    Alda Lea, PT 12/07/2019, 11:34 AM  [facility] [address] [place], [place], [address] Phone: [phone]   Fax:  [phone]  Name: ABRON NEDDO MRN: [id number] Date of Birth: 03-24-1951

## 2019-12-10 ENCOUNTER — Other Ambulatory Visit: Payer: Self-pay

## 2019-12-10 ENCOUNTER — Telehealth: Payer: Self-pay | Admitting: Internal Medicine

## 2019-12-10 ENCOUNTER — Ambulatory Visit (HOSPITAL_COMMUNITY)
Admission: RE | Admit: 2019-12-10 | Discharge: 2019-12-10 | Disposition: A | Discharge status: Home / Self Care | Payer: Medicare Other | Source: Ambulatory Visit | Attending: Cardiology | Admitting: Cardiology

## 2019-12-10 ENCOUNTER — Ambulatory Visit: Payer: Self-pay

## 2019-12-10 DIAGNOSIS — R4781 Slurred speech: Secondary | ICD-10-CM

## 2019-12-10 DIAGNOSIS — G8191 Hemiplegia, unspecified affecting right dominant side: Secondary | ICD-10-CM

## 2019-12-10 NOTE — Telephone Encounter (Signed)
New Message:   Pt is calling and states he was referred to a Neuro dr by Dr. Camila Li and he states they are unable to get him in until the end of October. Pt also states that he is not scheduled with the Dr that Dr. Camila Li wanted him with but wants to make sure that is okay with Dr. Camila Li. Please advise.

## 2019-12-10 NOTE — Telephone Encounter (Signed)
Noted It is ok Thx

## 2019-12-11 ENCOUNTER — Encounter: Payer: Self-pay | Admitting: Occupational Therapy

## 2019-12-11 ENCOUNTER — Ambulatory Visit: Payer: Medicare Other | Admitting: Occupational Therapy

## 2019-12-11 ENCOUNTER — Ambulatory Visit: Payer: Medicare Other

## 2019-12-11 VITALS — BP 150/80

## 2019-12-11 DIAGNOSIS — R2689 Other abnormalities of gait and mobility: Secondary | ICD-10-CM

## 2019-12-11 DIAGNOSIS — R278 Other lack of coordination: Secondary | ICD-10-CM

## 2019-12-11 DIAGNOSIS — M6281 Muscle weakness (generalized): Secondary | ICD-10-CM

## 2019-12-11 DIAGNOSIS — R208 Other disturbances of skin sensation: Secondary | ICD-10-CM

## 2019-12-11 DIAGNOSIS — R2681 Unsteadiness on feet: Secondary | ICD-10-CM

## 2019-12-11 DIAGNOSIS — R260 Ataxic gait: Secondary | ICD-10-CM | POA: Diagnosis not present

## 2019-12-11 DIAGNOSIS — I69351 Hemiplegia and hemiparesis following cerebral infarction affecting right dominant side: Secondary | ICD-10-CM

## 2019-12-11 NOTE — Therapy (Signed)
[facility] [address] [place], [place], [address] Phone: [phone]   Fax:  [phone]  Physical Therapy Treatment  Patient Details  Name: Larry Stone MRN: [id number] Date of Birth: [birth year] Referring Provider (PT): Dr. Lew Dawes   Encounter Date: 12/11/2019   PT End of Session - 12/11/19 1448    Visit Number 2    Number of Visits 13   eval to 12 visits   Authorization Type [facility] Medicare    Authorization Time Period 12-06-19 - 03-04-20    PT Start Time 1446    PT Stop Time 1528    PT Time Calculation (min) 42 min    Activity Tolerance Patient tolerated treatment well    Behavior During Therapy [facility] for tasks assessed/performed           Past Medical History:  Diagnosis Date  . Anxiety   . CAD    a. acute inferior lateral wall infarction in September 2004 treated medically. b.  cath 06/17/16 showing mild nonobstructive CAD with 30-40% ostial LM (eccentric), elevated LV filling pressures and normal LV function.  . Chronic diastolic CHF (congestive heart failure) ([place])   . CKD (chronic Stone disease), stage II   . COPD (chronic obstructive pulmonary disease) ([place])   . Diabetes mellitus   . Diverticulosis   . DJD (degenerative joint disease)   . GERD (gastroesophageal reflux disease)   . HTN (hypertension)   . Hyperlipidemia   . Low back pain syndrome   . Myocardial infarction ([place]) 2004  . Obesity   . OSA (obstructive sleep apnea)   . RBBB   . Recurrent aspiration bronchitis/pneumonia   . Recurrent aspiration pneumonia ([place])    [name] 07/13/2016  . Thrombocytopenia ([place])   . Tubular adenoma of colon 2014    Past Surgical History:  Procedure Laterality Date  . CARDIAC CATHETERIZATION    . LEFT HEART CATH AND CORONARY ANGIOGRAPHY N/A 06/17/2016   Procedure: Left Heart Cath and Coronary Angiography;  Surgeon: Burnell Blanks, MD;  Location: [facility];  Service: Cardiovascular;   Laterality: N/A;  . UVULOPALATOPHARYNGOPLASTY     surgery for OSA    Vitals:   12/11/19 1449  BP: (!) 150/80     Subjective Assessment - 12/11/19 1448    Subjective Pt reports that he is still feeling kind of tired. He got plenty of sleep last night.    Patient is accompained by: Family member   wife Larry Stone   Pertinent History s/p Lt  thalamic CVA:  Anxiety, essential HTN, DJD, CAD, COPD with chronic bronchitis, chronic diaastolic CHF, h/o MI in 0347: 06-17-16 L heart catheterization & coroncay angiography    Diagnostic tests CT scan done ; MRI to be scheduled    Patient Stated Goals Improve balance and walking    Currently in Pain? No/denies              [facility] PT Assessment - 12/11/19 1453      Functional Gait  Assessment   Gait assessed  Yes    Gait Level Surface Walks 20 ft, slow speed, abnormal gait pattern, evidence for imbalance or deviates 10-15 in outside of the 12 in walkway width. Requires more than 7 sec to ambulate 20 ft.    Change in Gait Speed Able to change speed, demonstrates mild gait deviations, deviates 6-10 in outside of the 12 in walkway width, or no gait deviations, unable to achieve a major change in velocity,  or uses a change in velocity, or uses an assistive device.    Gait with Horizontal Head Turns Performs head turns with moderate changes in gait velocity, slows down, deviates 10-15 in outside 12 in walkway width but recovers, can continue to walk.    Gait with Vertical Head Turns Performs task with slight change in gait velocity (eg, minor disruption to smooth gait path), deviates 6 - 10 in outside 12 in walkway width or uses assistive device    Gait and Pivot Turn Pivot turns safely in greater than 3 sec and stops with no loss of balance, or pivot turns safely within 3 sec and stops with mild imbalance, requires small steps to catch balance.    Step Over Obstacle Is able to step over one shoe box (4.5 in total height) without changing gait speed. No evidence  of imbalance.    Gait with Narrow Base of Support Ambulates less than 4 steps heel to toe or cannot perform without assistance.    Gait with Eyes Closed Walks 20 ft, slow speed, abnormal gait pattern, evidence for imbalance, deviates 10-15 in outside 12 in walkway width. Requires more than 9 sec to ambulate 20 ft.    Ambulating Backwards Walks 20 ft, uses assistive device, slower speed, mild gait deviations, deviates 6-10 in outside 12 in walkway width.    Steps Two feet to a stair, must use rail.    Total Score 14                         [facility] Adult PT Treatment/Exercise - 12/11/19 1453      Ambulation/Gait   Ambulation/Gait Yes    Ambulation/Gait Assistance 5: Supervision;4: Min guard    Ambulation/Gait Assistance Details around in clinic during activities    Assistive device None    Gait Pattern Step-through pattern;Ataxic   at right foot   Ambulation Surface Level;Indoor      Neuro Re-ed    Neuro Re-ed Details  Along counter: marching gait 6' x 4, tandem gait with 1 UE support 6' x 4, side stepping without UE support 6' x 4, reciprocal steps over 4 cones tapping each cone first to increase SLS time and work on coordination 6' x 6. Alternating to taps on cone x 10 bilateral with occasional UE support. Standing in corner on airex: feet together eyes open x 30 sec, eyes closed x 30 sec CGA with increased sway, head turns up/down x 10 and left/right x 10. Marching in place on airex x 10 without UE support CGA/min assist. CGA with activities for safety and verbal cues to focus on foot placement on right. BP=148/80 after exercises.                  PT Education - 12/11/19 1545    Education Details Started on balance HEP    Person(s) Educated Patient    Methods Explanation;Demonstration;Handout    Comprehension Verbalized understanding;Returned demonstration            PT Short Term Goals - 12/07/19 1118      PT SHORT TERM GOAL #1   Title Improve Berg balance  test score from 46/56 to >/= 49/56 to reduce fall risk.    Baseline 46/56    Time 3   extended due to delay in start of treatment   Period Weeks    Status New    Target Date 12/28/19      PT SHORT TERM GOAL #  2   Title Pt will amb. 500' with no device on flat even surface with supervision.    Time 3    Period Weeks    Status New    Target Date 12/28/19      PT SHORT TERM GOAL #3   Title Increase gait velocity by at least .4 ft/sec for incr. gait efficiency.    Baseline TBA    Time 3    Period Weeks    Status New    Target Date 12/28/19      PT SHORT TERM GOAL #4   Title Independent in HEP for balance and coordination exercises.    Time 3    Status New    Target Date 12/28/19             PT Long Term Goals - 12/11/19 1546      PT LONG TERM GOAL #1   Title Improve Berg balance score to >/= 52/56 to reduce fall risk.    Baseline 46/56 on 12-06-19    Time 6    Period Weeks    Status New      PT LONG TERM GOAL #2   Title Pt will amb. 1000' on flat even and uneven surface without device modified independently with pt demonstrating ability to look ahead rather than down at feet.    Time 6    Status New      PT LONG TERM GOAL #3   Title Pt will report negotiating steps at home to 2nd level (rather than using chair lift) modified independently.    Time 6    Period Weeks    Status New      PT LONG TERM GOAL #4   Title Independent in updated HEP for balance and coordination exercises.    Time 6    Period Weeks    Status New      PT LONG TERM GOAL #5   Title Improve FGA score by at least 5 points to increase safety with gait and to reduce fall risk.    Baseline FGA on 12/11/19 14/30    Time 6    Period Weeks    Status New                 Plan - 12/11/19 1546    Clinical Impression Statement PT assessed FGA today and pt high fall risk with score of 14/30. Pt was showing less extraneous movement at right foot compared to last visit. Started on balance  training and HEP.    Personal Factors and Comorbidities Behavior Pattern;Comorbidity 2    Comorbidities anxiety, HTN, DJD, CAD, COPD with chronic bronchitis, chronic diastolic CHF, 12-22-15 lt heart cath. & coronary angiography, s/p MI 2004    Examination-Activity Limitations Bend;Stairs;Squat;Locomotion Level;Transfers    Examination-Participation Restrictions Cleaning;Shop;[employer] aid/development worker work activities   Stability/Clinical Decision Making Evolving/Moderate complexity    Rehab Potential Good    PT Frequency 2x / week    PT Duration 6 weeks    PT Treatment/Interventions ADLs/Self Care Home Management;DME Instruction;Gait training;Stair training;Therapeutic activities;Therapeutic exercise;Balance training;Neuromuscular re-education;Patient/family education    PT Next Visit Plan high level dynamic balance exercises and coordination ex. RLE:  gait training without device    PT Home Exercise Plan coordination ex. RLE in standing (12-06-19)    Consulted and Agree with Plan of Care Patient;Family member/caregiver    Family Member Consulted spouse Larry Stone           Patient  will benefit from skilled therapeutic intervention in order to improve the following deficits and impairments:  Abnormal gait, Decreased activity tolerance, Decreased balance, Decreased coordination, Decreased strength, Impaired sensation  Visit Diagnosis: Other abnormalities of gait and mobility  Muscle weakness (generalized)     Problem List Patient Active Problem List   Diagnosis Date Noted  . Cerebrovascular accident (CVA) of left thalamus ([place]) 12/05/2019  . Right hemiparesis ([place]) 12/04/2019  . Chronic renal insufficiency, stage 3 (moderate) 07/05/2019  . Mild cognitive disorder 10/12/2017  . Viral pneumonitis 07/22/2016  . COPD with asthma ([place]) 07/22/2016  . Depression with anxiety 07/13/2016  . Acute respiratory failure with hypoxia ([place]) 07/13/2016  . Chronic diastolic CHF  (congestive heart failure) ([place]) 07/13/2016  . Sepsis, unspecified organism ([place]) 07/13/2016  . Acute pain of right knee 06/30/2016  . Fatigue 06/30/2016  . Creatinine elevation 06/30/2016  . Dyspnea on exertion   . COPD with acute exacerbation ([place]) 04/04/2015  . Elevated IgE level 03/18/2014  . Well adult exam 03/05/2014  . COPD with chronic bronchitis ([place]) 12/13/2013  . Venous insufficiency 11/03/2011  . Edema 11/03/2011  . Memory disturbance 05/13/2011  . CAD (coronary artery disease) 09/17/2010  . 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 Incidental pulmonary nodule 06/17/2009  . COLONIC POLYPS 10/17/2007  . BRONCHITIS, RECURRENT 10/17/2007  . GERD (gastroesophageal reflux disease) 10/17/2007  . Diverticulosis of large intestine 10/17/2007  . DEGENERATIVE JOINT DISEASE 10/17/2007  . Hyperglycemia 10/17/2007  . Dyslipidemia 10/02/2007  . Anxiety state 10/02/2007  . Obstructive sleep apnea 10/02/2007  . Essential hypertension 10/02/2007  . LOW BACK PAIN SYNDROME 10/02/2007    Electa Sniff, PT, DPT, NCS 12/11/2019, 3:49 PM  [facility] [address] [place], [place], [address] Phone: [phone]   Fax:  [phone]  Name: Larry Stone MRN: [id number] Date of Birth: 1951-01-29

## 2019-12-11 NOTE — Patient Instructions (Signed)
Access Code: [email or web address] URL: [email or web address] Date: 12/11/2019 Prepared by: Cherly Anderson  Exercises Walking March - 2 x daily - 7 x weekly - 1 sets - 4 reps Walking Tandem Stance - 2 x daily - 7 x weekly - 1 sets - 4 reps Side Stepping with Counter Support - 2 x daily - 7 x weekly - 1 sets - 4 reps Standing on foam eyes open - 2 x daily - 7 x weekly - 1 sets - 10 reps Standing head turns - 2 x daily - 7 x weekly - 1 sets - 10 reps

## 2019-12-11 NOTE — Addendum Note (Signed)
Addended by: Earnstine Regal on: 12/11/2019 10:23 AM   Modules accepted: Orders

## 2019-12-11 NOTE — Therapy (Signed)
[facility] [address] [place], [place], [address] Phone: [phone]   Fax:  [phone]  Occupational Therapy Evaluation  Patient Details  Name: Larry Stone MRN: [id number] Date of Birth: 17-May-1950 Referring Provider (OT): Larry Stone   Encounter Date: 12/11/2019   OT End of Session - 12/11/19 1635    Visit Number 1    Number of Visits 17    Date for OT Re-Evaluation 03/10/20    Authorization Type [employer] Medicare  VL:MN    Progress Note Due on Visit 10    OT Start Time 1530    OT Stop Time 1620    OT Time Calculation (min) 50 min    Activity Tolerance Patient tolerated treatment well    Behavior During Therapy Larry Stone for tasks assessed/performed           Past Medical History:  Diagnosis Date  . Anxiety   . CAD    a. acute inferior lateral wall infarction in September 2004 treated medically. b.  cath 06/17/16 showing mild nonobstructive CAD with 30-40% ostial LM (eccentric), elevated LV filling pressures and normal LV function.  . Chronic diastolic CHF (congestive heart failure) ([place])   . CKD (chronic kidney disease), stage II   . COPD (chronic obstructive pulmonary disease) ([place])   . Diabetes mellitus   . Diverticulosis   . DJD (degenerative joint disease)   . GERD (gastroesophageal reflux disease)   . HTN (hypertension)   . Hyperlipidemia   . Low back pain syndrome   . Myocardial infarction ([place]) 2004  . Obesity   . OSA (obstructive sleep apnea)   . RBBB   . Recurrent aspiration bronchitis/pneumonia   . Recurrent aspiration pneumonia ([place])    Larry Stone 07/13/2016  . Thrombocytopenia ([place])   . Tubular adenoma of colon 2014    Past Surgical History:  Procedure Laterality Date  . CARDIAC CATHETERIZATION    . LEFT HEART CATH AND CORONARY ANGIOGRAPHY N/A 06/17/2016   Procedure: Left Heart Cath and Coronary Angiography;  Surgeon: Larry Blanks, MD;  Location: [facility];  Service:  Cardiovascular;  Laterality: N/A;  . UVULOPALATOPHARYNGOPLASTY     surgery for OSA    There were no vitals filed for this visit.   Subjective Assessment - 12/11/19 1534    Subjective  I am hoping to gain the sense of coordination in my right hand    Patient is accompanied by: Family member   Larry Stone   Currently in Pain? 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    Leisure mowing the lawn, playing with grandkids, playing with dogs      ADL   Eating/Feeding Supervision/safety    Grooming --   ordered electric toothbrush and razor   Upper Body Bathing --  increased time   Lower Body Bathing Use of adaptive device    Upper Body Dressing Needs assist for fasteners;Increased time    Lower Body Dressing Minimal assistance    Toilet Transfer Modified independent    Toileting -  Hygiene Increase time    Tub/Shower Transfer Modified independent      IADL   Prior Level of Function Shopping independent    Shopping Assistance for transportation    Prior Level of Function Light Housekeeping independent    Light Housekeeping Performs light daily tasks but cannot maintain acceptable level of cleanliness    Prior Level of Function Meal Prep Independent - grilling    Meal Prep Able to complete simple cold meal and snack prep    Prior Level of Function Scientist, research (physical sciences) Relies on family or friends for transportation    Prior Level of Function Medication Managment Independent     Medication Management Takes responsibility if medication is prepared in advance in seperate dosage    Prior Level of Function Therapist, sports financial matters independently (budgets, writes checks, pays rent, bills goes to bank), collects and keeps track of income      Written Expression   Dominant Hand Right    Handwriting 100% legible      Vision - History   Baseline Vision Wears glasses all the time    Additional Comments trifocals- reports no visual changes      Vision Assessment   Eye Alignment Within Functional Limits    Ocular Range of Motion Within Functional Limits    Alignment/Gaze Preference Within Defined Limits    Tracking/Visual Pursuits Able to track stimulus in all quads without difficulty      Activity Tolerance   Activity Tolerance --   endurance limits activity     Cognition   Overall Cognitive Status Within Functional Limits for tasks assessed    Cognition Comments Patient with slightly slower processing      Posture/Postural Control   Posture/Postural Control No significant limitations      Sensation   Light Touch Impaired by gross assessment   ok to testing - diminished   Stereognosis Appears Intact    Proprioception Appears Intact      Coordination   Fine Motor Movements are Fluid and Coordinated No    Finger Nose Finger Test slight undershooting    9 Hole Peg Test Right;Left    Right 9 Hole Peg Test 41.32    Left 9 Hole Peg Test 29.50      ROM / Strength   AROM / PROM / Strength AROM;Strength      AROM   Overall AROM  Within functional limits for tasks performed      Strength   Overall Strength Within functional limits for tasks performed      Hand Function   Right Hand Gross Grasp Functional    Right Hand Grip (lbs) 95.0    Right Hand Lateral Pinch 19 lbs    Right Hand 3 Point Pinch 18 lbs    Left Hand Gross Grasp Functional    Left Hand Grip (lbs) 82.8    Left Hand Lateral Pinch 19 lbs     Left 3 point pinch 15 lbs                           OT Education - 12/11/19 1635    Education  Details Reviewed results of evaluation and potential goals/plan of care    Person(s) Educated Patient;Child(ren)    Methods Explanation    Comprehension Verbalized understanding            OT Short Term Goals - 12/11/19 1642      OT SHORT TERM GOAL #1   Title Patient will complete a home exercise program designed to improve coordiantion in right hand independently 01/25/20    Time 4    Period Weeks    Status New    Target Date 01/25/20      OT SHORT TERM GOAL #2   Title Patient will demonstrate effective use of fork or spoon with right UE reporting decreased spilling/dropping    Time 4    Period Weeks    Status New      OT SHORT TERM GOAL #3   Title Patient will report less dropping when picking up small objects with RUE    Time 4    Period Weeks    Status New             OT Long Term Goals - 12/11/19 1646      OT LONG TERM GOAL #1   Title Patient will complete an updated HEP independently    Time 8    Period Weeks    Status New    Target Date 03/10/20      OT LONG TERM GOAL #2   Title Patient will demonstrate awareness of driving recommendations    Time 8    Period Weeks    Status New      OT LONG TERM GOAL #3   Title Patient will demonstrate effective use of hand tools - e.g. screwdriver, wrench, hammer with RUE as dominant    Time 8    Period Weeks    Status New      OT LONG TERM GOAL #4   Title Patient will demonstrate improved coordiantion in RUE as evidenced by 5 sec reduction in time on 9 hole peg test    Time 8    Period Weeks    Status New                 Plan - 12/11/19 1637    Clinical Impression Statement Larry Stone is a 69 yo male referred by his primary care MD for evaluation of CVA. Pt presented with right sided numbness and weakness on 12/04/19 to ED; left due to long wait. His PCP ordered CT head which showed  "small hypodensity left thalamus compatible with infarct of indeterminate age." Has not been evaluated by neurology.  Patient presents to OT evaluation with decreased sensation in dominant right hand, decreased balance, decreased activity tolerance, and incoordination RUE.    OT Occupational Profile and History Problem Focused Assessment - Including review of records relating to presenting problem    Occupational performance deficits (Please refer to evaluation for details): ADL's;IADL's;Leisure    Body Structure / Function / Physical Skills ADL;Coordination;Endurance    Rehab Potential Excellent    Clinical Decision Making Limited treatment options, no task modification necessary    Comorbidities Affecting Occupational Performance: May have comorbidities impacting occupational performance    Modification or Assistance to Complete Evaluation  No modification of tasks or assist necessary to complete eval    OT Frequency 2x / week    OT Duration 8 weeks    OT Treatment/Interventions Self-care/ADL training;Therapeutic exercise;Patient/family education;Splinting;Neuromuscular education;Therapeutic activities;Balance training;Manual Therapy;DME and/or AE instruction  Plan Needs higher level corrdination training - RUE    Consulted and Agree with Plan of Care Patient;Family member/caregiver    Family Member Consulted Larry Ashleigh           Patient will benefit from skilled therapeutic intervention in order to improve the following deficits and impairments:   Body Structure / Function / Physical Skills: ADL, Coordination, Endurance       Visit Diagnosis: Hemiplegia and hemiparesis following cerebral infarction affecting right dominant side ([place]) - Plan: Ot plan of care cert/re-cert  Decreased coordination - Plan: Ot plan of care cert/re-cert  Unsteadiness on feet - Plan: Ot plan of care cert/re-cert  Other disturbances of skin sensation - Plan: Ot plan of care  cert/re-cert    Problem List Patient Active Problem List   Diagnosis Date Noted  . Cerebrovascular accident (CVA) of left thalamus ([place]) 12/05/2019  . Right hemiparesis ([place]) 12/04/2019  . Chronic renal insufficiency, stage 3 (moderate) 07/05/2019  . Mild cognitive disorder 10/12/2017  . Viral pneumonitis 07/22/2016  . COPD with asthma ([place]) 07/22/2016  . Depression with anxiety 07/13/2016  . Acute respiratory failure with hypoxia ([place]) 07/13/2016  . Chronic diastolic CHF (congestive heart failure) ([place]) 07/13/2016  . Sepsis, unspecified organism ([place]) 07/13/2016  . Acute pain of right knee 06/30/2016  . Fatigue 06/30/2016  . Creatinine elevation 06/30/2016  . Dyspnea on exertion   . COPD with acute exacerbation ([place]) 04/04/2015  . Elevated IgE level 03/18/2014  . Well adult exam 03/05/2014  . COPD with chronic bronchitis ([place]) 12/13/2013  . Venous insufficiency 11/03/2011  . Edema 11/03/2011  . Memory disturbance 05/13/2011  . CAD (coronary artery disease) 09/17/2010  . 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 Incidental pulmonary nodule 06/17/2009  . COLONIC POLYPS 10/17/2007  . BRONCHITIS, RECURRENT 10/17/2007  . GERD (gastroesophageal reflux disease) 10/17/2007  . Diverticulosis of large intestine 10/17/2007  . DEGENERATIVE JOINT DISEASE 10/17/2007  . Hyperglycemia 10/17/2007  . Dyslipidemia 10/02/2007  . Anxiety state 10/02/2007  . Obstructive sleep apnea 10/02/2007  . Essential hypertension 10/02/2007  . LOW BACK PAIN SYNDROME 10/02/2007    Mariah Milling, OTR/L 12/11/2019, 4:50 PM  [facility] [address] [place], [place], [address] Phone: [phone]   Fax:  [phone]  Name: Larry Stone MRN: [id number] Date of Birth: 26-Aug-1950

## 2019-12-12 ENCOUNTER — Ambulatory Visit: Payer: Medicare Other | Admitting: Internal Medicine

## 2019-12-12 ENCOUNTER — Encounter: Payer: Self-pay | Admitting: Occupational Therapy

## 2019-12-12 ENCOUNTER — Ambulatory Visit: Payer: Self-pay

## 2019-12-12 ENCOUNTER — Other Ambulatory Visit: Payer: Self-pay

## 2019-12-12 ENCOUNTER — Ambulatory Visit: Payer: Medicare Other | Admitting: Occupational Therapy

## 2019-12-12 DIAGNOSIS — I69351 Hemiplegia and hemiparesis following cerebral infarction affecting right dominant side: Secondary | ICD-10-CM

## 2019-12-12 DIAGNOSIS — R260 Ataxic gait: Secondary | ICD-10-CM | POA: Diagnosis not present

## 2019-12-12 DIAGNOSIS — R208 Other disturbances of skin sensation: Secondary | ICD-10-CM

## 2019-12-12 DIAGNOSIS — R2681 Unsteadiness on feet: Secondary | ICD-10-CM

## 2019-12-12 DIAGNOSIS — M6281 Muscle weakness (generalized): Secondary | ICD-10-CM

## 2019-12-12 DIAGNOSIS — R278 Other lack of coordination: Secondary | ICD-10-CM

## 2019-12-12 NOTE — Patient Instructions (Signed)
°  Coordination Activities  Perform the following activities for 10 minutes 2-3  times per day with right hand(s).   Rotate card in hand (clockwise and counter-clockwise).  Shuffle cards.  Pick up coins and place in container or coin bank.  Pick up coins one at a time until you get 5-10 in your hand, then move coins from palm to fingertips to stack one at a time.  Twirl pen between fingers.  Practice writing and/or typing.  Screw together nuts and bolts, then unfasten.  rotate two golf balls in your hand clockwise x5, then counterclockwise x5

## 2019-12-12 NOTE — Telephone Encounter (Signed)
Pt informed of below.  

## 2019-12-12 NOTE — Therapy (Signed)
[facility] [address] [place], [place], [address] Phone: [phone]   Fax:  [phone]  Occupational Therapy Treatment  Patient Details  Name: Larry Stone MRN: [id number] Date of Birth: 09/25/1950 Referring Provider (OT): Dr Lew Dawes   Encounter Date: 12/12/2019   OT End of Session - 12/12/19 1403    Visit Number 2    Number of Visits 17    Date for OT Re-Evaluation 03/10/20    Authorization Type [employer] Medicare  VL:MN    Progress Note Due on Visit 10    OT Start Time 1315    OT Stop Time 1400    OT Time Calculation (min) 45 min    Activity Tolerance Patient tolerated treatment well    Behavior During Therapy [facility] for tasks assessed/performed           Past Medical History:  Diagnosis Date  . Anxiety   . CAD    a. acute inferior lateral wall infarction in September 2004 treated medically. b.  cath 06/17/16 showing mild nonobstructive CAD with 30-40% ostial LM (eccentric), elevated LV filling pressures and normal LV function.  . Chronic diastolic CHF (congestive heart failure) ([place])   . CKD (chronic kidney disease), stage II   . COPD (chronic obstructive pulmonary disease) ([place])   . Diabetes mellitus   . Diverticulosis   . DJD (degenerative joint disease)   . GERD (gastroesophageal reflux disease)   . HTN (hypertension)   . Hyperlipidemia   . Low back pain syndrome   . Myocardial infarction ([place]) 2004  . Obesity   . OSA (obstructive sleep apnea)   . RBBB   . Recurrent aspiration bronchitis/pneumonia   . Recurrent aspiration pneumonia ([place])    [name] 07/13/2016  . Thrombocytopenia ([place])   . Tubular adenoma of colon 2014    Past Surgical History:  Procedure Laterality Date  . CARDIAC CATHETERIZATION    . LEFT HEART CATH AND CORONARY ANGIOGRAPHY N/A 06/17/2016   Procedure: Left Heart Cath and Coronary Angiography;  Surgeon: Burnell Blanks, MD;  Location: [facility];  Service:  Cardiovascular;  Laterality: N/A;  . UVULOPALATOPHARYNGOPLASTY     surgery for OSA    There were no vitals filed for this visit.   Subjective Assessment - 12/12/19 1319    Subjective  They moved up my MRI    Currently in Pain? No/denies    Pain Score 0-No pain                        OT Treatments/Exercises (OP) - 12/12/19 0001      ADLs   ADL Comments Reviewed short and long term goals with patient.  He is in agreement.        Neurological Re-education Exercises   Other Exercises 1 Initiated HEP for coordination.                    OT Education - 12/12/19 1402    Education Details Initiated HEP for coordiantion RUE    Person(s) Educated Patient    Methods Explanation;Demonstration;Verbal cues    Comprehension Verbalized understanding;Returned demonstration;Verbal cues required;Need further instruction            OT Short Term Goals - 12/12/19 1405      OT SHORT TERM GOAL #1   Title Patient will complete a home exercise program designed to improve coordiantion in right hand independently 01/25/20    Time  4    Period Weeks    Status New    Target Date 01/25/20      OT SHORT TERM GOAL #2   Title Patient will demonstrate effective use of fork or spoon with right UE reporting decreased spilling/dropping    Time 4    Period Weeks    Status New      OT SHORT TERM GOAL #3   Title Patient will report less dropping when picking up small objects with RUE    Time 4    Period Weeks    Status New             OT Long Term Goals - 12/12/19 1405      OT LONG TERM GOAL #1   Title Patient will complete an updated HEP independently    Time 8    Period Weeks    Status New      OT LONG TERM GOAL #2   Title Patient will demonstrate awareness of driving recommendations    Time 8    Period Weeks    Status New      OT LONG TERM GOAL #3   Title Patient will demonstrate effective use of hand tools - e.g. screwdriver, wrench, hammer with RUE as  dominant    Time 8    Period Weeks    Status New      OT LONG TERM GOAL #4   Title Patient will demonstrate improved coordiantion in RUE as evidenced by 5 sec reduction in time on 9 hole peg test    Time 8    Period Weeks    Status New                 Plan - 12/12/19 1403    Clinical Impression Statement Patient in agreement with OT goals.  Patient reporting improvement in his ability to locate icons on phone today versus yesterday.   Patient still concerned regarding level of fatigue.    OT Occupational Profile and History Problem Focused Assessment - Including review of records relating to presenting problem    Occupational performance deficits (Please refer to evaluation for details): ADL's;IADL's;Leisure    Body Structure / Function / Physical Skills ADL;Coordination;Endurance    Rehab Potential Excellent    Clinical Decision Making Limited treatment options, no task modification necessary    Comorbidities Affecting Occupational Performance: May have comorbidities impacting occupational performance    Modification or Assistance to Complete Evaluation  No modification of tasks or assist necessary to complete eval    OT Frequency 2x / week    OT Duration 8 weeks    OT Treatment/Interventions Self-care/ADL training;Therapeutic exercise;Patient/family education;Splinting;Neuromuscular education;Therapeutic activities;Balance training;Manual Therapy;DME and/or AE instruction    Plan Higher level coordiantion training - fine and gross motor    OT Home Exercise Plan coordination    Consulted and Agree with Plan of Care Patient           Patient will benefit from skilled therapeutic intervention in order to improve the following deficits and impairments:   Body Structure / Function / Physical Skills: ADL, Coordination, Endurance       Visit Diagnosis: Muscle weakness (generalized)  Hemiplegia and hemiparesis following cerebral infarction affecting right dominant side  (HCC)  Decreased coordination  Unsteadiness on feet  Other disturbances of skin sensation    Problem List Patient Active Problem List   Diagnosis Date Noted  . Cerebrovascular accident (CVA) of left thalamus ([place]) 12/05/2019  . Right  hemiparesis ([place]) 12/04/2019  . Chronic renal insufficiency, stage 3 (moderate) 07/05/2019  . Mild cognitive disorder 10/12/2017  . Viral pneumonitis 07/22/2016  . COPD with asthma ([place]) 07/22/2016  . Depression with anxiety 07/13/2016  . Acute respiratory failure with hypoxia ([place]) 07/13/2016  . Chronic diastolic CHF (congestive heart failure) ([place]) 07/13/2016  . Sepsis, unspecified organism ([place]) 07/13/2016  . Acute pain of right knee 06/30/2016  . Fatigue 06/30/2016  . Creatinine elevation 06/30/2016  . Dyspnea on exertion   . COPD with acute exacerbation ([place]) 04/04/2015  . Elevated IgE level 03/18/2014  . Well adult exam 03/05/2014  . COPD with chronic bronchitis ([place]) 12/13/2013  . Venous insufficiency 11/03/2011  . Edema 11/03/2011  . Memory disturbance 05/13/2011  . CAD (coronary artery disease) 09/17/2010  . 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 Incidental pulmonary nodule 06/17/2009  . COLONIC POLYPS 10/17/2007  . BRONCHITIS, RECURRENT 10/17/2007  . GERD (gastroesophageal reflux disease) 10/17/2007  . Diverticulosis of large intestine 10/17/2007  . DEGENERATIVE JOINT DISEASE 10/17/2007  . Hyperglycemia 10/17/2007  . Dyslipidemia 10/02/2007  . Anxiety state 10/02/2007  . Obstructive sleep apnea 10/02/2007  . Essential hypertension 10/02/2007  . LOW BACK PAIN SYNDROME 10/02/2007    Mariah Milling, OTR/L 12/12/2019, 2:06 PM  [facility] [address] [place], [place], [address] Phone: [phone]   Fax:  [phone]  Name: Larry Stone MRN: [id number] Date of Birth: November 20, 1950

## 2019-12-13 ENCOUNTER — Encounter: Payer: Self-pay | Admitting: Physical Therapy

## 2019-12-13 ENCOUNTER — Ambulatory Visit: Payer: Medicare Other | Admitting: Physical Therapy

## 2019-12-13 DIAGNOSIS — R260 Ataxic gait: Secondary | ICD-10-CM

## 2019-12-13 DIAGNOSIS — I69351 Hemiplegia and hemiparesis following cerebral infarction affecting right dominant side: Secondary | ICD-10-CM

## 2019-12-13 DIAGNOSIS — R2689 Other abnormalities of gait and mobility: Secondary | ICD-10-CM

## 2019-12-13 DIAGNOSIS — R278 Other lack of coordination: Secondary | ICD-10-CM

## 2019-12-13 DIAGNOSIS — R2681 Unsteadiness on feet: Secondary | ICD-10-CM

## 2019-12-13 NOTE — Therapy (Signed)
[facility] [address] [place], [place], [address] Phone: [phone]   Fax:  [phone]  Physical Therapy Treatment  Patient Details  Name: Larry Stone MRN: [id number] Date of Birth: 04/28/1950 Referring Provider (PT): Dr. Lew Dawes   Encounter Date: 12/13/2019   PT End of Session - 12/13/19 1107    Visit Number 3    Number of Visits 13   eval to 12 visits   Authorization Type [facility] Medicare    Authorization Time Period 12-06-19 - 03-04-20    PT Start Time 1104    PT Stop Time 1142    PT Time Calculation (min) 38 min    Equipment Utilized During Treatment Gait belt    Activity Tolerance Patient tolerated treatment well    Behavior During Therapy [facility] for tasks assessed/performed           Past Medical History:  Diagnosis Date  . Anxiety   . CAD    a. acute inferior lateral wall infarction in September 2004 treated medically. b.  cath 06/17/16 showing mild nonobstructive CAD with 30-40% ostial LM (eccentric), elevated LV filling pressures and normal LV function.  . Chronic diastolic CHF (congestive heart failure) ([place])   . CKD (chronic Stone disease), stage II   . COPD (chronic obstructive pulmonary disease) ([place])   . Diabetes mellitus   . Diverticulosis   . DJD (degenerative joint disease)   . GERD (gastroesophageal reflux disease)   . HTN (hypertension)   . Hyperlipidemia   . Low back pain syndrome   . Myocardial infarction ([place]) 2004  . Obesity   . OSA (obstructive sleep apnea)   . RBBB   . Recurrent aspiration bronchitis/pneumonia   . Recurrent aspiration pneumonia ([place])    [name] 07/13/2016  . Thrombocytopenia ([place])   . Tubular adenoma of colon 2014    Past Surgical History:  Procedure Laterality Date  . CARDIAC CATHETERIZATION    . LEFT HEART CATH AND CORONARY ANGIOGRAPHY N/A 06/17/2016   Procedure: Left Heart Cath and Coronary Angiography;  Surgeon: Burnell Blanks, MD;   Location: [facility];  Service: Cardiovascular;  Laterality: N/A;  . UVULOPALATOPHARYNGOPLASTY     surgery for OSA    There were no vitals filed for this visit.   Subjective Assessment - 12/13/19 1105    Subjective No new complaints. Continues to be tired, reports getting a good night sleep. No change to try HEP at home.    Patient is accompained by: Family member   wife Larry Stone   Pertinent History s/p Lt  thalamic CVA:  Anxiety, essential HTN, DJD, CAD, COPD with chronic bronchitis, chronic diaastolic CHF, h/o MI in 1700: 06-17-16 L heart catheterization & coroncay angiography    Diagnostic tests CT scan done ; MRI is scheduled for 12/19/19    Currently in Pain? No/denies    Pain Score 0-No pain                 [facility] Adult PT Treatment/Exercise - 12/13/19 1107      Transfers   Transfers Sit to Stand;Stand to Sit    Sit to Stand 5: Supervision    Stand to Sit 5: Supervision      Ambulation/Gait   Ambulation/Gait Yes    Ambulation/Gait Assistance 5: Supervision    Ambulation/Gait Assistance Details around clinic with session    Assistive device None    Gait Pattern Step-through pattern;Ataxic    Ambulation Surface Level;Indoor  High Level Balance   High Level Balance Activities Side stepping;Marching forwards;Marching backwards;Tandem walking   tandem gait fwd/bwd   High Level Balance Comments in red/blue mats next to counter top: 3 laps each with light UE support on counter, cues on ex form/technique and to maintain wider base of support. min guard to min assist for balance.                Balance Exercises - 12/13/19 0001      Balance Exercises: Standing   Standing Eyes Closed Wide (BOA);Narrow base of support (BOS);Head turns;Foam/compliant surface;Other reps (comment);30 secs;Limitations    Standing Eyes Closed Limitations on airex no UE support: narrow base of support- EC no head movements, progressing to wide base of support for EC head movements  left<>right, up<>down. up to min assist for balance with posterior sway noted.      SLS with Vectors Foam/compliant surface;Upper extremity assist 2;Other reps (comment);Limitations    SLS with Vectors Limitations on airex with 2 tall cones in front with light bil UE support on bars: alternating fwd foot taps, cross foot taps, fwd double foot taps, then cross double foot taps, ~10 reps each, min guard assist. cues to slow down and maintain wider base of support to allow for weight shifting.     Balance Beam standing across blue foam beam: alternating fwd stepping to floor/back onto beam, then alternating bwd stepping to floor/back onto beam, ~10 reps each with cues needed on step length/height, weight shifting and to maintain a wider base of support. min guard to min assist for balance with no UE support on bar.     Sit to Stand Standard surface;Without upper extremity support;Foam/compliant surface;Limitations    Sit to Stand Limitations seated with feet across red foam beam, no UE assist, with cues for full standing/controlled descent with sitting down.               PT Short Term Goals - 12/07/19 1118      PT SHORT TERM GOAL #1   Title Improve Berg balance test score from 46/56 to >/= 49/56 to reduce fall risk.    Baseline 46/56    Time 3   extended due to delay in start of treatment   Period Weeks    Status New    Target Date 12/28/19      PT SHORT TERM GOAL #2   Title Pt will amb. 500' with no device on flat even surface with supervision.    Time 3    Period Weeks    Status New    Target Date 12/28/19      PT SHORT TERM GOAL #3   Title Increase gait velocity by at least .4 ft/sec for incr. gait efficiency.    Baseline TBA    Time 3    Period Weeks    Status New    Target Date 12/28/19      PT SHORT TERM GOAL #4   Title Independent in HEP for balance and coordination exercises.    Time 3    Status New    Target Date 12/28/19             PT Long Term Goals -  12/11/19 1546      PT LONG TERM GOAL #1   Title Improve Berg balance score to >/= 52/56 to reduce fall risk.    Baseline 46/56 on 12-06-19    Time 6    Period Weeks    Status  New      PT LONG TERM GOAL #2   Title Pt will amb. 1000' on flat even and uneven surface without device modified independently with pt demonstrating ability to look ahead rather than down at feet.    Time 6    Status New      PT LONG TERM GOAL #3   Title Pt will report negotiating steps at home to 2nd level (rather than using chair lift) modified independently.    Time 6    Period Weeks    Status New      PT LONG TERM GOAL #4   Title Independent in updated HEP for balance and coordination exercises.    Time 6    Period Weeks    Status New      PT LONG TERM GOAL #5   Title Improve FGA score by at least 5 points to increase safety with gait and to reduce fall risk.    Baseline FGA on 12/11/19 14/30    Time 6    Period Weeks    Status New                 Plan - 12/13/19 1107    Clinical Impression Statement Today's skilled session continued to focus on balance and coordination with rest breaks taken as needed. Pt continues with ataxic movements of right LE with activities. The pt is progressing toward goals and should benefit from continued PT to progress toward unmet goals.    Personal Factors and Comorbidities Behavior Pattern;Comorbidity 2    Comorbidities anxiety, HTN, DJD, CAD, COPD with chronic bronchitis, chronic diastolic CHF, 09-20-99 lt heart cath. & coronary angiography, s/p MI 2004    Examination-Activity Limitations Bend;Stairs;Squat;Locomotion Level;Transfers    Examination-Participation Restrictions Cleaning;Shop;[employer]/development worker work activities   Stability/Clinical Decision Making Evolving/Moderate complexity    Rehab Potential Good    PT Frequency 2x / week    PT Duration 6 weeks    PT Treatment/Interventions ADLs/Self Care Home Management;DME  Instruction;Gait training;Stair training;Therapeutic activities;Therapeutic exercise;Balance training;Neuromuscular re-education;Patient/family education    PT Next Visit Plan high level dynamic balance exercises and coordination ex. RLE:  gait training without device    PT Home Exercise Plan coordination ex. RLE in standing (12-06-19)    Consulted and Agree with Plan of Care Patient;Family member/caregiver    Family Member Consulted spouse Larry Stone           Patient will benefit from skilled therapeutic intervention in order to improve the following deficits and impairments:  Abnormal gait, Decreased activity tolerance, Decreased balance, Decreased coordination, Decreased strength, Impaired sensation  Visit Diagnosis: Hemiplegia and hemiparesis following cerebral infarction affecting right dominant side (HCC)  Unsteadiness on feet  Other abnormalities of gait and mobility  Decreased coordination  Ataxic gait     Problem List Patient Active Problem List   Diagnosis Date Noted  . Cerebrovascular accident (CVA) of left thalamus ([place]) 12/05/2019  . Right hemiparesis ([place]) 12/04/2019  . Chronic renal insufficiency, stage 3 (moderate) 07/05/2019  . Mild cognitive disorder 10/12/2017  . Viral pneumonitis 07/22/2016  . COPD with asthma ([place]) 07/22/2016  . Depression with anxiety 07/13/2016  . Acute respiratory failure with hypoxia ([place]) 07/13/2016  . Chronic diastolic CHF (congestive heart failure) ([place]) 07/13/2016  . Sepsis, unspecified organism ([place]) 07/13/2016  . Acute pain of right knee 06/30/2016  . Fatigue 06/30/2016  . Creatinine elevation 06/30/2016  . Dyspnea on exertion   . COPD with acute exacerbation (  [place]) 04/04/2015  . Elevated IgE level 03/18/2014  . Well adult exam 03/05/2014  . COPD with chronic bronchitis ([place]) 12/13/2013  . Venous insufficiency 11/03/2011  . Edema 11/03/2011  . Memory disturbance 05/13/2011  . CAD (coronary artery disease) 09/17/2010  .  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 Incidental pulmonary nodule 06/17/2009  . COLONIC POLYPS 10/17/2007  . BRONCHITIS, RECURRENT 10/17/2007  . GERD (gastroesophageal reflux disease) 10/17/2007  . Diverticulosis of large intestine 10/17/2007  . DEGENERATIVE JOINT DISEASE 10/17/2007  . Hyperglycemia 10/17/2007  . Dyslipidemia 10/02/2007  . Anxiety state 10/02/2007  . Obstructive sleep apnea 10/02/2007  . Essential hypertension 10/02/2007  . LOW BACK PAIN SYNDROME 10/02/2007    Willow Ora, PTA, [facility] [address], [place], [place] [address] [phone] 12/13/19, 12:48 PM   Name: Larry Stone MRN: [id number] Date of Birth: 02-Aug-1950

## 2019-12-17 NOTE — Telephone Encounter (Signed)
Pt was seen 12/06/2019 and these concerns have been addressed

## 2019-12-17 NOTE — Telephone Encounter (Signed)
Pt had OT 12/11/2019

## 2019-12-18 ENCOUNTER — Other Ambulatory Visit: Payer: Self-pay

## 2019-12-18 ENCOUNTER — Ambulatory Visit: Payer: Medicare Other

## 2019-12-18 DIAGNOSIS — R260 Ataxic gait: Secondary | ICD-10-CM | POA: Diagnosis not present

## 2019-12-18 DIAGNOSIS — R2681 Unsteadiness on feet: Secondary | ICD-10-CM

## 2019-12-18 DIAGNOSIS — I69351 Hemiplegia and hemiparesis following cerebral infarction affecting right dominant side: Secondary | ICD-10-CM

## 2019-12-18 DIAGNOSIS — R2689 Other abnormalities of gait and mobility: Secondary | ICD-10-CM

## 2019-12-18 NOTE — Therapy (Signed)
[facility] [address] [place], [place], [address] Phone: [phone]   Fax:  [phone]  Physical Therapy Treatment  Patient Details  Name: Larry Stone MRN: [id number] Date of Birth: 03/17/1951 Referring Provider (PT): Dr. Lew Dawes   Encounter Date: 12/18/2019   PT End of Session - 12/18/19 1317    Visit Number 4    Number of Visits 13   eval to 12 visits   Authorization Type [facility] Medicare    Authorization Time Period 12-06-19 - 03-04-20    PT Start Time 1315    PT Stop Time 1402    PT Time Calculation (min) 47 min    Equipment Utilized During Treatment Gait belt    Activity Tolerance Patient tolerated treatment well    Behavior During Therapy [facility] for tasks assessed/performed           Past Medical History:  Diagnosis Date  . Anxiety   . CAD    a. acute inferior lateral wall infarction in September 2004 treated medically. b.  cath 06/17/16 showing mild nonobstructive CAD with 30-40% ostial LM (eccentric), elevated LV filling pressures and normal LV function.  . Chronic diastolic CHF (congestive heart failure) ([place])   . CKD (chronic kidney disease), stage II   . COPD (chronic obstructive pulmonary disease) ([place])   . Diabetes mellitus   . Diverticulosis   . DJD (degenerative joint disease)   . GERD (gastroesophageal reflux disease)   . HTN (hypertension)   . Hyperlipidemia   . Low back pain syndrome   . Myocardial infarction ([place]) 2004  . Obesity   . OSA (obstructive sleep apnea)   . RBBB   . Recurrent aspiration bronchitis/pneumonia   . Recurrent aspiration pneumonia ([place])    [name] 07/13/2016  . Thrombocytopenia ([place])   . Tubular adenoma of colon 2014    Past Surgical History:  Procedure Laterality Date  . CARDIAC CATHETERIZATION    . LEFT HEART CATH AND CORONARY ANGIOGRAPHY N/A 06/17/2016   Procedure: Left Heart Cath and Coronary Angiography;  Surgeon: Burnell Blanks, MD;   Location: [facility];  Service: Cardiovascular;  Laterality: N/A;  . UVULOPALATOPHARYNGOPLASTY     surgery for OSA    There were no vitals filed for this visit.   Subjective Assessment - 12/18/19 1314    Subjective Pt reports that left shoulder blade in to neck area bothers him every morning. Once he gets up moving and using heating pad it calms down. Reports that it started a couple months ago.    Patient is accompained by: Family member   wife Stanton Kidney   Pertinent History s/p Lt  thalamic CVA:  Anxiety, essential HTN, DJD, CAD, COPD with chronic bronchitis, chronic diaastolic CHF, h/o MI in 7078: 06-17-16 L heart catheterization & coroncay angiography    Diagnostic tests CT scan done ; MRI is scheduled for 12/19/19    Currently in Pain? Yes    Pain Score 2     Pain Location Shoulder    Pain Orientation Left    Pain Descriptors / Indicators Sore    Pain Onset More than a month ago    Pain Frequency Intermittent    Aggravating Factors  when first gets up in morning.    Pain Relieving Factors heating pad or voltaren gel  [facility] Adult PT Treatment/Exercise - 12/18/19 1318      Ambulation/Gait   Ambulation/Gait Yes    Ambulation/Gait Assistance 5: Supervision    Ambulation/Gait Assistance Details around in clinic during activities    Assistive device None    Gait Pattern Step-through pattern;Ataxic    Ambulation Surface Level;Indoor      Neuro Re-ed    Neuro Re-ed Details  Pt performed walking over red and blue mats: normal steps  x 2 laps then marching steps CGA x 2 laps then side stepping x 2 laps. No UE support for all. Standing in tandem on blue mat x 30 sec each position with CGA/min assist with RLE posterior. Tandem gait on blue mat x 6' with 1 UE support x 4. Walking over floor ladder x 2 laps then with diagonals x 2 laps then side stepping x 2 laps. Pt had more difficulty when right foot trailing almost tripping himself at times. Sit  to stand on foam beam x 10. Step-ups on blue foam beam x 10 getting balance each time. Standing on blue foam beam with alternating shoulder flexion x 10. Standing on black beam x 30 sec. Dynamic gait in hallway 40' x 2 each: gait with head turns left/right and then up/down, marching gait with naming animals, backwards gait. Pt less steady with horizontal head turns with decreased steps and staggering at times. CGA for safety.                  PT Education - 12/18/19 1631    Education Details Added standing on 2x4 at sink at home with family assist.    Person(s) Educated Patient;Child(ren)    Methods Explanation;Demonstration;Handout    Comprehension Verbalized understanding            PT Short Term Goals - 12/07/19 1118      PT SHORT TERM GOAL #1   Title Improve Berg balance test score from 46/56 to >/= 49/56 to reduce fall risk.    Baseline 46/56    Time 3   extended due to delay in start of treatment   Period Weeks    Status New    Target Date 12/28/19      PT SHORT TERM GOAL #2   Title Pt will amb. 500' with no device on flat even surface with supervision.    Time 3    Period Weeks    Status New    Target Date 12/28/19      PT SHORT TERM GOAL #3   Title Increase gait velocity by at least .4 ft/sec for incr. gait efficiency.    Baseline TBA    Time 3    Period Weeks    Status New    Target Date 12/28/19      PT SHORT TERM GOAL #4   Title Independent in HEP for balance and coordination exercises.    Time 3    Status New    Target Date 12/28/19             PT Long Term Goals - 12/11/19 1546      PT LONG TERM GOAL #1   Title Improve Berg balance score to >/= 52/56 to reduce fall risk.    Baseline 46/56 on 12-06-19    Time 6    Period Weeks    Status New      PT LONG TERM GOAL #2   Title Pt will amb. 1000' on flat even and uneven surface without device  modified independently with pt demonstrating ability to look ahead rather than down at feet.    Time  6    Status New      PT LONG TERM GOAL #3   Title Pt will report negotiating steps at home to 2nd level (rather than using chair lift) modified independently.    Time 6    Period Weeks    Status New      PT LONG TERM GOAL #4   Title Independent in updated HEP for balance and coordination exercises.    Time 6    Period Weeks    Status New      PT LONG TERM GOAL #5   Title Improve FGA score by at least 5 points to increase safety with gait and to reduce fall risk.    Baseline FGA on 12/11/19 14/30    Time 6    Period Weeks    Status New                 Plan - 12/18/19 1632    Clinical Impression Statement PT continued to focus on balance and coordination activities with RLE. Pt does have slight ataxic steps on RLE at times with gait.    Personal Factors and Comorbidities Behavior Pattern;Comorbidity 2    Comorbidities anxiety, HTN, DJD, CAD, COPD with chronic bronchitis, chronic diastolic CHF, 09-21-38 lt heart cath. & coronary angiography, s/p MI 2004    Examination-Activity Limitations Bend;Stairs;Squat;Locomotion Level;Transfers    Examination-Participation Restrictions Cleaning;Shop;[employer]/development worker work activities   Stability/Clinical Decision Making Evolving/Moderate complexity    Rehab Potential Good    PT Frequency 2x / week    PT Duration 6 weeks    PT Treatment/Interventions ADLs/Self Care Home Management;DME Instruction;Gait training;Stair training;Therapeutic activities;Therapeutic exercise;Balance training;Neuromuscular re-education;Patient/family education    PT Next Visit Plan high level dynamic balance exercises and coordination ex. RLE:  gait training without device    PT Home Exercise Plan coordination ex. RLE in standing (12-06-19)    Consulted and Agree with Plan of Care Patient;Family member/caregiver    Family Member Consulted daughter           Patient will benefit from skilled therapeutic intervention in order to  improve the following deficits and impairments:  Abnormal gait, Decreased activity tolerance, Decreased balance, Decreased coordination, Decreased strength, Impaired sensation  Visit Diagnosis: Other abnormalities of gait and mobility  Hemiplegia and hemiparesis following cerebral infarction affecting right dominant side (HCC)  Unsteadiness on feet     Problem List Patient Active Problem List   Diagnosis Date Noted  . Cerebrovascular accident (CVA) of left thalamus ([place]) 12/05/2019  . Right hemiparesis ([place]) 12/04/2019  . Chronic renal insufficiency, stage 3 (moderate) 07/05/2019  . Mild cognitive disorder 10/12/2017  . Viral pneumonitis 07/22/2016  . COPD with asthma ([place]) 07/22/2016  . Depression with anxiety 07/13/2016  . Acute respiratory failure with hypoxia ([place]) 07/13/2016  . Chronic diastolic CHF (congestive heart failure) ([place]) 07/13/2016  . Sepsis, unspecified organism ([place]) 07/13/2016  . Acute pain of right knee 06/30/2016  . Fatigue 06/30/2016  . Creatinine elevation 06/30/2016  . Dyspnea on exertion   . COPD with acute exacerbation ([place]) 04/04/2015  . Elevated IgE level 03/18/2014  . Well adult exam 03/05/2014  . COPD with chronic bronchitis ([place]) 12/13/2013  . Venous insufficiency 11/03/2011  . Edema 11/03/2011  . Memory disturbance 05/13/2011  . CAD (coronary artery disease) 09/17/2010  . 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 Incidental pulmonary  nodule 06/17/2009  . COLONIC POLYPS 10/17/2007  . BRONCHITIS, RECURRENT 10/17/2007  . GERD (gastroesophageal reflux disease) 10/17/2007  . Diverticulosis of large intestine 10/17/2007  . DEGENERATIVE JOINT DISEASE 10/17/2007  . Hyperglycemia 10/17/2007  . Dyslipidemia 10/02/2007  . Anxiety state 10/02/2007  . Obstructive sleep apnea 10/02/2007  . Essential hypertension 10/02/2007  . LOW BACK PAIN SYNDROME 10/02/2007    Electa Sniff, PT, DPT, NCS 12/18/2019, 4:34 PM  [facility] [address] [place], [place], [address] Phone: [phone]   Fax:  [phone]  Name: Larry Stone MRN: [id number] Date of Birth: [birth year]

## 2019-12-18 NOTE — Patient Instructions (Signed)
Access Code: [email or web address] URL: [email or web address] Date: 12/18/2019 Prepared by: Cherly Anderson  Exercises Walking March - 2 x daily - 7 x weekly - 1 sets - 4 reps Walking Tandem Stance - 2 x daily - 7 x weekly - 1 sets - 4 reps Side Stepping with Counter Support - 2 x daily - 7 x weekly - 1 sets - 4 reps Standing on foam eyes open - 2 x daily - 7 x weekly - 1 sets - 10 reps Standing head turns - 2 x daily - 7 x weekly - 1 sets - 10 reps Standing on beam - 1 x daily - 7 x weekly - 3 sets - 10 reps

## 2019-12-19 ENCOUNTER — Ambulatory Visit (HOSPITAL_COMMUNITY)
Admission: RE | Admit: 2019-12-19 | Discharge: 2019-12-19 | Disposition: A | Discharge status: Home / Self Care | Payer: Medicare Other | Source: Ambulatory Visit | Attending: Internal Medicine | Admitting: Internal Medicine

## 2019-12-19 DIAGNOSIS — I6381 Other cerebral infarction due to occlusion or stenosis of small artery: Secondary | ICD-10-CM

## 2019-12-19 DIAGNOSIS — I639 Cerebral infarction, unspecified: Secondary | ICD-10-CM | POA: Insufficient documentation

## 2019-12-19 MED ORDER — GADOBUTROL 1 MMOL/ML IV SOLN
10.0000 mL | Freq: Once | INTRAVENOUS | Status: AC | PRN
  Administered 2019-12-19: 10 mL via INTRAVENOUS

## 2019-12-20 ENCOUNTER — Ambulatory Visit: Payer: Medicare Other | Attending: Internal Medicine | Admitting: Physical Therapy

## 2019-12-20 ENCOUNTER — Ambulatory Visit: Payer: Medicare Other | Admitting: Occupational Therapy

## 2019-12-20 ENCOUNTER — Encounter: Payer: Self-pay | Admitting: Occupational Therapy

## 2019-12-20 ENCOUNTER — Other Ambulatory Visit: Payer: Self-pay

## 2019-12-20 ENCOUNTER — Encounter: Payer: Self-pay | Admitting: Physical Therapy

## 2019-12-20 DIAGNOSIS — I69351 Hemiplegia and hemiparesis following cerebral infarction affecting right dominant side: Secondary | ICD-10-CM

## 2019-12-20 DIAGNOSIS — R260 Ataxic gait: Secondary | ICD-10-CM

## 2019-12-20 DIAGNOSIS — R2681 Unsteadiness on feet: Secondary | ICD-10-CM | POA: Insufficient documentation

## 2019-12-20 DIAGNOSIS — R278 Other lack of coordination: Secondary | ICD-10-CM | POA: Insufficient documentation

## 2019-12-20 DIAGNOSIS — R2689 Other abnormalities of gait and mobility: Secondary | ICD-10-CM | POA: Diagnosis present

## 2019-12-20 DIAGNOSIS — M6281 Muscle weakness (generalized): Secondary | ICD-10-CM

## 2019-12-20 DIAGNOSIS — R209 Unspecified disturbances of skin sensation: Secondary | ICD-10-CM | POA: Insufficient documentation

## 2019-12-20 DIAGNOSIS — R208 Other disturbances of skin sensation: Secondary | ICD-10-CM

## 2019-12-20 NOTE — Therapy (Signed)
[facility] [address] [place], [place], [address] Phone: [phone]   Fax:  [phone]  Occupational Therapy Treatment  Patient Details  Name: Larry Stone MRN: [id number] Date of Birth: Apr 02, 1951 Referring Provider (OT): Dr Lew Dawes   Encounter Date: 12/20/2019   OT End of Session - 12/20/19 1552    Visit Number 3    Number of Visits 17    Date for OT Re-Evaluation 03/10/20    Authorization Type [employer] Medicare  VL:MN    Progress Note Due on Visit 10    OT Start Time 1315    OT Stop Time 1400    OT Time Calculation (min) 45 min    Activity Tolerance Patient tolerated treatment well    Behavior During Therapy [facility] for tasks assessed/performed           Past Medical History:  Diagnosis Date  . Anxiety   . CAD    a. acute inferior lateral wall infarction in September 2004 treated medically. b.  cath 06/17/16 showing mild nonobstructive CAD with 30-40% ostial LM (eccentric), elevated LV filling pressures and normal LV function.  . Chronic diastolic CHF (congestive heart failure) ([place])   . CKD (chronic kidney disease), stage II   . COPD (chronic obstructive pulmonary disease) ([place])   . Diabetes mellitus   . Diverticulosis   . DJD (degenerative joint disease)   . GERD (gastroesophageal reflux disease)   . HTN (hypertension)   . Hyperlipidemia   . Low back pain syndrome   . Myocardial infarction ([place]) 2004  . Obesity   . OSA (obstructive sleep apnea)   . RBBB   . Recurrent aspiration bronchitis/pneumonia   . Recurrent aspiration pneumonia ([place])    [name] 07/13/2016  . Thrombocytopenia ([place])   . Tubular adenoma of colon 2014    Past Surgical History:  Procedure Laterality Date  . CARDIAC CATHETERIZATION    . LEFT HEART CATH AND CORONARY ANGIOGRAPHY N/A 06/17/2016   Procedure: Left Heart Cath and Coronary Angiography;  Surgeon: Burnell Blanks, MD;  Location: [facility];  Service:  Cardiovascular;  Laterality: N/A;  . UVULOPALATOPHARYNGOPLASTY     surgery for OSA    There were no vitals filed for this visit.   Subjective Assessment - 12/20/19 1544    Subjective  I had my MRI - the results are in mychart.    Currently in Pain? No/denies    Pain Score 0-No pain                        OT Treatments/Exercises (OP) - 12/20/19 0001      ADLs   ADL Comments Patient indicates his family think he sometimes is doing too much.  When asked to explain - he indicated [place].  Discussed that he was trying to build activity tolerance, and walking seemed a safe way to address this.  Discussed possibly carrying cell phone so family could reach him or vice versa if trouble.        Neurological Re-education Exercises   Other Exercises 1 Neuromuscular reeducation to address gross motor coordination.  Patient with significant sensory loss RUE - which makes grading pressure (throwing, tossing, catching a ball) more challenging.  Patient with poor postural alignment with walking - able to correvct in standing so center of mass is over his feet versus behind his feet. Worked on walking while manipulating  various size balls unilaterally, bilaterally and bimanually.                     OT Short Term Goals - 12/20/19 1554      OT SHORT TERM GOAL #1   Title Patient will complete a home exercise program designed to improve coordiantion in right hand independently 01/25/20    Time 4    Period Weeks    Status Achieved    Target Date 01/25/20      OT SHORT TERM GOAL #2   Title Patient will demonstrate effective use of fork or spoon with right UE reporting decreased spilling/dropping    Time 4    Period Weeks    Status On-going      OT SHORT TERM GOAL #3   Title Patient will report less dropping when picking up small objects with RUE    Time 4    Period Weeks    Status On-going             OT Long Term Goals - 12/20/19  1555      OT LONG TERM GOAL #1   Title Patient will complete an updated HEP independently    Time 8    Period Weeks    Status On-going      OT LONG TERM GOAL #2   Title Patient will demonstrate awareness of driving recommendations    Time 8    Period Weeks    Status On-going      OT LONG TERM GOAL #3   Title Patient will demonstrate effective use of hand tools - e.g. screwdriver, wrench, hammer with RUE as dominant    Time 8    Period Weeks    Status On-going      OT LONG TERM GOAL #4   Title Patient will demonstrate improved coordination in RUE as evidenced by 5 sec reduction in time on 9 hole peg test    Time 8    Period Weeks    Status On-going                 Plan - 12/20/19 1553    Clinical Impression Statement Patient reports increased automatic functional use of RUE - giving example of effectively using a hammer this week.    OT Occupational Profile and History Problem Focused Assessment - Including review of records relating to presenting problem    Occupational performance deficits (Please refer to evaluation for details): ADL's;IADL's;Leisure    Body Structure / Function / Physical Skills ADL;Coordination;Endurance    Rehab Potential Excellent    Clinical Decision Making Limited treatment options, no task modification necessary    Comorbidities Affecting Occupational Performance: May have comorbidities impacting occupational performance    Modification or Assistance to Complete Evaluation  No modification of tasks or assist necessary to complete eval    OT Frequency 2x / week    OT Duration 8 weeks    OT Treatment/Interventions Self-care/ADL training;Therapeutic exercise;Patient/family education;Splinting;Neuromuscular education;Therapeutic activities;Balance training;Manual Therapy;DME and/or AE instruction    Plan Higher level coordiantion training - fine and gross motor    OT Home Exercise Plan coordination    Consulted and Agree with Plan of Care Patient            Patient will benefit from skilled therapeutic intervention in order to improve the following deficits and impairments:   Body Structure / Function / Physical Skills: ADL, Coordination, Endurance       Visit Diagnosis:  Hemiplegia and hemiparesis following cerebral infarction affecting right dominant side (HCC)  Unsteadiness on feet  Decreased coordination  Muscle weakness (generalized)  Other disturbances of skin sensation    Problem List Patient Active Problem List   Diagnosis Date Noted  . Cerebrovascular accident (CVA) of left thalamus ([place]) 12/05/2019  . Right hemiparesis ([place]) 12/04/2019  . Chronic renal insufficiency, stage 3 (moderate) 07/05/2019  . Mild cognitive disorder 10/12/2017  . Viral pneumonitis 07/22/2016  . COPD with asthma ([place]) 07/22/2016  . Depression with anxiety 07/13/2016  . Acute respiratory failure with hypoxia ([place]) 07/13/2016  . Chronic diastolic CHF (congestive heart failure) ([place]) 07/13/2016  . Sepsis, unspecified organism ([place]) 07/13/2016  . Acute pain of right knee 06/30/2016  . Fatigue 06/30/2016  . Creatinine elevation 06/30/2016  . Dyspnea on exertion   . COPD with acute exacerbation ([place]) 04/04/2015  . Elevated IgE level 03/18/2014  . Well adult exam 03/05/2014  . COPD with chronic bronchitis ([place]) 12/13/2013  . Venous insufficiency 11/03/2011  . Edema 11/03/2011  . Memory disturbance 05/13/2011  . CAD (coronary artery disease) 09/17/2010  . 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 Incidental pulmonary nodule 06/17/2009  . COLONIC POLYPS 10/17/2007  . BRONCHITIS, RECURRENT 10/17/2007  . GERD (gastroesophageal reflux disease) 10/17/2007  . Diverticulosis of large intestine 10/17/2007  . DEGENERATIVE JOINT DISEASE 10/17/2007  . Hyperglycemia 10/17/2007  . Dyslipidemia 10/02/2007  . Anxiety state 10/02/2007  . Obstructive sleep apnea 10/02/2007  . Essential hypertension 10/02/2007  . LOW BACK PAIN SYNDROME 10/02/2007     Mariah Milling, OTR/L 12/20/2019, 3:56 PM  [facility] [address] [place], [place], [address] Phone: [phone]   Fax:  [phone]  Name: Larry Stone MRN: [id number] Date of Birth: 11-14-1950

## 2019-12-21 ENCOUNTER — Telehealth: Payer: Self-pay | Admitting: Cardiovascular Disease

## 2019-12-21 NOTE — Telephone Encounter (Signed)
How about 11:40 on 12/28/19 with me? Thanks, chris

## 2019-12-21 NOTE — Telephone Encounter (Signed)
Patient made aware of new appointment on 9/10 at 11:40am.

## 2019-12-21 NOTE — Telephone Encounter (Signed)
    How about 11:40 on 12/28/19 with me? Thanks, chris     Add on per Dr. Georg Ruddle made. Attempted to call the patient but unable to leave a message.

## 2019-12-21 NOTE — Telephone Encounter (Signed)
Due to stroke 2-3 weeks ago, patient is requesting to be seen sooner than appointment currently scheduled for 01/14/20 with Dr. Angelena Form. Made him aware Dr. Alfredo Batty do not have availability within the next week. However, he is requesting to be worked in. Please assist.

## 2019-12-21 NOTE — Therapy (Signed)
[facility] [address] [place], [place], [address] Phone: [phone]   Fax:  [phone]  Physical Therapy Treatment  Patient Details  Name: Larry Stone MRN: [id number] Date of Birth: 02/11/1951 Referring Provider (PT): Dr. Lew Dawes   Encounter Date: 12/20/2019   PT End of Session - 12/20/19 1408    Visit Number 5    Number of Visits 13   eval to 12 visits   Authorization Type [facility] Medicare    Authorization Time Period 12-06-19 - 03-04-20    PT Start Time 1403    PT Stop Time 1445    PT Time Calculation (min) 42 min    Equipment Utilized During Treatment Gait belt    Activity Tolerance Patient tolerated treatment well    Behavior During Therapy [facility] for tasks assessed/performed           Past Medical History:  Diagnosis Date  . Anxiety   . CAD    a. acute inferior lateral wall infarction in September 2004 treated medically. b.  cath 06/17/16 showing mild nonobstructive CAD with 30-40% ostial LM (eccentric), elevated LV filling pressures and normal LV function.  . Chronic diastolic CHF (congestive heart failure) ([place])   . CKD (chronic kidney disease), stage II   . COPD (chronic obstructive pulmonary disease) ([place])   . Diabetes mellitus   . Diverticulosis   . DJD (degenerative joint disease)   . GERD (gastroesophageal reflux disease)   . HTN (hypertension)   . Hyperlipidemia   . Low back pain syndrome   . Myocardial infarction ([place]) 2004  . Obesity   . OSA (obstructive sleep apnea)   . RBBB   . Recurrent aspiration bronchitis/pneumonia   . Recurrent aspiration pneumonia ([place])    [name] 07/13/2016  . Thrombocytopenia ([place])   . Tubular adenoma of colon 2014    Past Surgical History:  Procedure Laterality Date  . CARDIAC CATHETERIZATION    . LEFT HEART CATH AND CORONARY ANGIOGRAPHY N/A 06/17/2016   Procedure: Left Heart Cath and Coronary Angiography;  Surgeon: Burnell Blanks, MD;   Location: [facility];  Service: Cardiovascular;  Laterality: N/A;  . UVULOPALATOPHARYNGOPLASTY     surgery for OSA    There were no vitals filed for this visit.   Subjective Assessment - 12/20/19 1405    Subjective No new complaints. No falls or pain to report.    Pertinent History s/p Lt  thalamic CVA:  Anxiety, essential HTN, DJD, CAD, COPD with chronic bronchitis, chronic diaastolic CHF, h/o MI in 0092: 06-17-16 L heart catheterization & coroncay angiography    Diagnostic tests CT and MRI both done.    Patient Stated Goals Improve balance and walking    Currently in Pain? No/denies    Pain Score 0-No pain                 [facility] Adult PT Treatment/Exercise - 12/20/19 1410      Transfers   Transfers Sit to Stand;Stand to Sit    Sit to Stand 5: Supervision    Stand to Sit 5: Supervision      Ambulation/Gait   Ambulation/Gait Yes    Ambulation/Gait Assistance 5: Supervision;4: Min guard    Ambulation/Gait Assistance Details gait with no device on outdoor surfaces including grass, pine needles, mulch and up/down steep hills with min guard to supervision assist.     Ambulation Distance (Feet) 600 Feet   x1 in/outdoors; around gym with session  Assistive device None    Gait Pattern Step-through pattern;Ataxic   occasional suffle noted    Ambulation Surface Level;Unlevel;Indoor;Outdoor;Paved;Gravel;Grass;Other (comment)   pine needles; rubber mulch              Balance Exercises - 12/20/19 1417      Balance Exercises: Standing   Rockerboard Anterior/posterior;Lateral;EO;EC;30 seconds;10 reps;Limitations    Rockerboard Limitations performed both ways on balance board: rocking the board with EO, progressing to EC; then holding the boad steady for EC 30 sec's x 3 reps. min guard to min assist with posterior balance loss preference, cues to shift forward needed at times.     Balance Beam standing across blue foam beam: alternating fwd stepping to floor/back onto beam,  then alternating bwd stepping to floor/back onto beam, ~10 reps each with cues needed on step length/height, weight shifting and to maintain a wider base of support. min guard to min assist for balance with no UE support.    Other Standing Exercises blue mat over ramp: performed both facing up, then down ramp. alternating fwd mini lunges for 10 reps each side, then with feet together for EC 30 sec's x 3 reps. increased postural sway and balance assistance needed with facing up ramp vs down ramp.                PT Short Term Goals - 12/07/19 1118      PT SHORT TERM GOAL #1   Title Improve Berg balance test score from 46/56 to >/= 49/56 to reduce fall risk.    Baseline 46/56    Time 3   extended due to delay in start of treatment   Period Weeks    Status New    Target Date 12/28/19      PT SHORT TERM GOAL #2   Title Pt will amb. 500' with no device on flat even surface with supervision.    Time 3    Period Weeks    Status New    Target Date 12/28/19      PT SHORT TERM GOAL #3   Title Increase gait velocity by at least .4 ft/sec for incr. gait efficiency.    Baseline TBA    Time 3    Period Weeks    Status New    Target Date 12/28/19      PT SHORT TERM GOAL #4   Title Independent in HEP for balance and coordination exercises.    Time 3    Status New    Target Date 12/28/19             PT Long Term Goals - 12/11/19 1546      PT LONG TERM GOAL #1   Title Improve Berg balance score to >/= 52/56 to reduce fall risk.    Baseline 46/56 on 12-06-19    Time 6    Period Weeks    Status New      PT LONG TERM GOAL #2   Title Pt will amb. 1000' on flat even and uneven surface without device modified independently with pt demonstrating ability to look ahead rather than down at feet.    Time 6    Status New      PT LONG TERM GOAL #3   Title Pt will report negotiating steps at home to 2nd level (rather than using chair lift) modified independently.    Time 6    Period  Weeks    Status New  PT LONG TERM GOAL #4   Title Independent in updated HEP for balance and coordination exercises.    Time 6    Period Weeks    Status New      PT LONG TERM GOAL #5   Title Improve FGA score by at least 5 points to increase safety with gait and to reduce fall risk.    Baseline FGA on 12/11/19 14/30    Time 6    Period Weeks    Status New                 Plan - 12/20/19 1409    Clinical Impression Statement Today's skilled session continued to focus on gait on various surfaces with no AD and balance reacion training on compliant surface with and without vision removed. Up to min assist needed for balance at times. The pt is progressing toward goals and should benefit from continued PT to progress toward unmet goals.    Personal Factors and Comorbidities Behavior Pattern;Comorbidity 2    Comorbidities anxiety, HTN, DJD, CAD, COPD with chronic bronchitis, chronic diastolic CHF, 08-22-29 lt heart cath. & coronary angiography, s/p MI 2004    Examination-Activity Limitations Bend;Stairs;Squat;Locomotion Level;Transfers    Examination-Participation Restrictions Cleaning;Shop;[employer]/development worker work activities   Stability/Clinical Decision Making Evolving/Moderate complexity    Rehab Potential Good    PT Frequency 2x / week    PT Duration 6 weeks    PT Treatment/Interventions ADLs/Self Care Home Management;DME Instruction;Gait training;Stair training;Therapeutic activities;Therapeutic exercise;Balance training;Neuromuscular re-education;Patient/family education    PT Next Visit Plan high level dynamic balance exercises and coordination ex. RLE:  gait training without device    PT Home Exercise Plan coordination ex. RLE in standing (12-06-19)    Consulted and Agree with Plan of Care Patient;Family member/caregiver    Family Member Consulted daughter           Patient will benefit from skilled therapeutic intervention in order to  improve the following deficits and impairments:  Abnormal gait, Decreased activity tolerance, Decreased balance, Decreased coordination, Decreased strength, Impaired sensation  Visit Diagnosis: Hemiplegia and hemiparesis following cerebral infarction affecting right dominant side (HCC)  Unsteadiness on feet  Ataxic gait  Decreased coordination  Muscle weakness (generalized)     Problem List Patient Active Problem List   Diagnosis Date Noted  . Cerebrovascular accident (CVA) of left thalamus ([place]) 12/05/2019  . Right hemiparesis ([place]) 12/04/2019  . Chronic renal insufficiency, stage 3 (moderate) 07/05/2019  . Mild cognitive disorder 10/12/2017  . Viral pneumonitis 07/22/2016  . COPD with asthma ([place]) 07/22/2016  . Depression with anxiety 07/13/2016  . Acute respiratory failure with hypoxia ([place]) 07/13/2016  . Chronic diastolic CHF (congestive heart failure) ([place]) 07/13/2016  . Sepsis, unspecified organism ([place]) 07/13/2016  . Acute pain of right knee 06/30/2016  . Fatigue 06/30/2016  . Creatinine elevation 06/30/2016  . Dyspnea on exertion   . COPD with acute exacerbation ([place]) 04/04/2015  . Elevated IgE level 03/18/2014  . Well adult exam 03/05/2014  . COPD with chronic bronchitis ([place]) 12/13/2013  . Venous insufficiency 11/03/2011  . Edema 11/03/2011  . Memory disturbance 05/13/2011  . CAD (coronary artery disease) 09/17/2010  . 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 Incidental pulmonary nodule 06/17/2009  . COLONIC POLYPS 10/17/2007  . BRONCHITIS, RECURRENT 10/17/2007  . GERD (gastroesophageal reflux disease) 10/17/2007  . Diverticulosis of large intestine 10/17/2007  . DEGENERATIVE JOINT DISEASE 10/17/2007  . Hyperglycemia 10/17/2007  . Dyslipidemia 10/02/2007  . Anxiety  state 10/02/2007  . Obstructive sleep apnea 10/02/2007  . Essential hypertension 10/02/2007  . LOW BACK PAIN SYNDROME 10/02/2007    Willow Ora, PTA, [facility] Outpatient Neuro [facility] [address] [place], [place] [address] [phone] 12/21/19, 12:22 PM   Name: Larry Stone MRN: [id number] Date of Birth: Apr 19, 1951

## 2019-12-22 ENCOUNTER — Other Ambulatory Visit: Payer: Self-pay | Admitting: Internal Medicine

## 2019-12-25 ENCOUNTER — Other Ambulatory Visit: Payer: Self-pay

## 2019-12-25 ENCOUNTER — Ambulatory Visit: Payer: Medicare Other | Admitting: Occupational Therapy

## 2019-12-25 ENCOUNTER — Encounter: Payer: Self-pay | Admitting: Occupational Therapy

## 2019-12-25 DIAGNOSIS — R2681 Unsteadiness on feet: Secondary | ICD-10-CM

## 2019-12-25 DIAGNOSIS — R208 Other disturbances of skin sensation: Secondary | ICD-10-CM

## 2019-12-25 DIAGNOSIS — M6281 Muscle weakness (generalized): Secondary | ICD-10-CM

## 2019-12-25 DIAGNOSIS — I69351 Hemiplegia and hemiparesis following cerebral infarction affecting right dominant side: Secondary | ICD-10-CM | POA: Diagnosis not present

## 2019-12-25 DIAGNOSIS — R278 Other lack of coordination: Secondary | ICD-10-CM

## 2019-12-25 NOTE — Therapy (Signed)
Larry Stone [address] [place], [place], [address] Phone: [phone]   Fax:  [phone]  Occupational Therapy Treatment  Patient Details  Name: Larry Stone MRN: [id number] Date of Birth: 03-26-1951 Referring Provider (OT): Dr Lew Dawes   Encounter Date: 12/25/2019   OT End of Session - 12/25/19 1758    Visit Number 4    Number of Visits 17    Date for OT Re-Evaluation 03/10/20    Authorization Type [employer] Medicare  VL:MN    Progress Note Due on Visit 10    OT Start Time 1615    OT Stop Time 1700    OT Time Calculation (min) 45 min    Activity Tolerance Patient tolerated treatment well    Behavior During Therapy [facility] for tasks assessed/performed           Past Medical History:  Diagnosis Date  . Anxiety   . CAD    a. acute inferior lateral wall infarction in September 2004 treated medically. b.  cath 06/17/16 showing mild nonobstructive CAD with 30-40% ostial LM (eccentric), elevated LV filling pressures and normal LV function.  . Chronic diastolic CHF (congestive heart failure) (Larry Stone)   . CKD (chronic kidney disease), stage II   . COPD (chronic obstructive pulmonary disease) (Larry Stone)   . Diabetes mellitus   . Diverticulosis   . DJD (degenerative joint disease)   . GERD (gastroesophageal reflux disease)   . HTN (hypertension)   . Hyperlipidemia   . Low back pain syndrome   . Myocardial infarction (Larry Stone) 2004  . Obesity   . OSA (obstructive sleep apnea)   . RBBB   . Recurrent aspiration bronchitis/pneumonia   . Recurrent aspiration pneumonia (Larry Stone)    Larry Stone 07/13/2016  . Thrombocytopenia (Larry Stone)   . Tubular adenoma of colon 2014    Past Surgical History:  Procedure Laterality Date  . CARDIAC CATHETERIZATION    . LEFT HEART CATH AND CORONARY ANGIOGRAPHY N/A 06/17/2016   Procedure: Left Heart Cath and Coronary Angiography;  Surgeon: Burnell Blanks, MD;  Location: [facility];  Service:  Cardiovascular;  Laterality: N/A;  . UVULOPALATOPHARYNGOPLASTY     surgery for OSA    There were no vitals filed for this visit.   Subjective Assessment - 12/25/19 1754    Subjective  My family doesn't want me watching TV - they say its not good for my brain.    Currently in Pain? No/denies    Pain Score 0-No pain                        OT Treatments/Exercises (OP) - 12/25/19 0001      ADLs   Driving Discussed graduated driving program.  Patient is ready to try behind the wheel with another driver in an empty parking lot.  Also discussed driving his riding mower.  See patient instructions.  Reiterated that patient needs MD clearance for returning to actual driving.        Neurological Re-education Exercises   Other Exercises 1 Grooved pegboard on horizontal and inclined surface.  Working for manipulation of small items within hand, manipulating items at fingertips, and reducing overuse of shoulder muscles for forearm, wrist and hand exercise.                    OT Education - 12/25/19 1757    Education Details graduated driving    Person(s) Educated Patient  Methods Explanation;Handout    Comprehension Verbalized understanding            OT Short Term Goals - 12/25/19 1800      OT SHORT TERM GOAL #1   Title Patient will complete a home exercise program designed to improve coordiantion in right hand independently 01/25/20    Time 4    Period Weeks    Status Achieved    Target Date 01/25/20      OT SHORT TERM GOAL #2   Title Patient will demonstrate effective use of fork or spoon with right UE reporting decreased spilling/dropping    Time 4    Period Weeks    Status Achieved      OT SHORT TERM GOAL #3   Title Patient will report less dropping when picking up small objects with RUE    Time 4    Period Weeks    Status Achieved             OT Long Term Goals - 12/25/19 1800      OT LONG TERM GOAL #1   Title Patient will complete an  updated HEP independently    Time 8    Period Weeks    Status On-going      OT LONG TERM GOAL #2   Title Patient will demonstrate awareness of driving recommendations    Time 8    Period Weeks    Status On-going      OT LONG TERM GOAL #3   Title Patient will demonstrate effective use of hand tools - e.g. screwdriver, wrench, hammer with RUE as dominant    Time 8    Period Weeks    Status On-going      OT LONG TERM GOAL #4   Title Patient will demonstrate improved coordination in RUE as evidenced by 5 sec reduction in time on 9 hole peg test    Time 8    Period Weeks    Status On-going                 Plan - 12/25/19 1758    Clinical Impression Statement Patient reports there is little he is unable to do now with right hand, indicating writing is improved, and he is spontaneously using more.  He is compensating for decreased sensation by use of vision.    OT Occupational Profile and History Problem Focused Assessment - Including review of records relating to presenting problem    Occupational performance deficits (Please refer to evaluation for details): ADL's;IADL's;Leisure    Body Structure / Function / Physical Skills ADL;Coordination;Endurance    Rehab Potential Excellent    Clinical Decision Making Limited treatment options, no task modification necessary    Comorbidities Affecting Occupational Performance: May have comorbidities impacting occupational performance    Modification or Assistance to Complete Evaluation  No modification of tasks or assist necessary to complete eval    OT Frequency 2x / week    OT Duration 8 weeks    OT Treatment/Interventions Self-care/ADL training;Therapeutic exercise;Patient/family education;Splinting;Neuromuscular education;Therapeutic activities;Balance training;Manual Therapy;DME and/or AE instruction    Plan Higher level coordination training - fine and gross motor    OT Home Exercise Plan coordination    Consulted and Agree with  Plan of Care Patient           Patient will benefit from skilled therapeutic intervention in order to improve the following deficits and impairments:   Body Structure / Function / Physical Skills: ADL, Coordination,  Endurance       Visit Diagnosis: Hemiplegia and hemiparesis following cerebral infarction affecting right dominant side (HCC)  Unsteadiness on feet  Decreased coordination  Muscle weakness (generalized)  Other disturbances of skin sensation    Problem List Patient Active Problem List   Diagnosis Date Noted  . Cerebrovascular accident (CVA) of left thalamus ([place]) 12/05/2019  . Right hemiparesis ([place]) 12/04/2019  . Chronic renal insufficiency, stage 3 (moderate) 07/05/2019  . Mild cognitive disorder 10/12/2017  . Viral pneumonitis 07/22/2016  . COPD with asthma ([place]) 07/22/2016  . Depression with anxiety 07/13/2016  . Acute respiratory failure with hypoxia ([place]) 07/13/2016  . Chronic diastolic CHF (congestive heart failure) ([place]) 07/13/2016  . Sepsis, unspecified organism ([place]) 07/13/2016  . Acute pain of right knee 06/30/2016  . Fatigue 06/30/2016  . Creatinine elevation 06/30/2016  . Dyspnea on exertion   . COPD with acute exacerbation ([place]) 04/04/2015  . Elevated IgE level 03/18/2014  . Well adult exam 03/05/2014  . COPD with chronic bronchitis ([place]) 12/13/2013  . Venous insufficiency 11/03/2011  . Edema 11/03/2011  . Memory disturbance 05/13/2011  . CAD (coronary artery disease) 09/17/2010  . 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 Incidental pulmonary nodule 06/17/2009  . COLONIC POLYPS 10/17/2007  . BRONCHITIS, RECURRENT 10/17/2007  . GERD (gastroesophageal reflux disease) 10/17/2007  . Diverticulosis of large intestine 10/17/2007  . DEGENERATIVE JOINT DISEASE 10/17/2007  . Hyperglycemia 10/17/2007  . Dyslipidemia 10/02/2007  . Anxiety state 10/02/2007  . Obstructive sleep apnea 10/02/2007  . Essential hypertension 10/02/2007  . LOW BACK  PAIN SYNDROME 10/02/2007    Mariah Milling, OTR/L 12/25/2019, 6:01 PM  [facility] [address] [place], [place], [address] Phone: [phone]   Fax:  [phone]  Name: SAVALAS MONJE MRN: [id number] Date of Birth: 02/01/1951

## 2019-12-25 NOTE — Patient Instructions (Signed)
Graduated driving program:    You will need a doctor's clearance to return to actual driving.   In the meantime you may start to practice.  Try riding mower.    First step of driving - operating the vehicle in an empty parking lot.   Please go with licensed adult driver.   Your goal is to assess your ability to operate the car - braking, accelerating, turning, backing up, parking, etc.   Please work to operate controls - turn signals, lights, etc.   Return to OT and discuss how this experience worked.

## 2019-12-26 ENCOUNTER — Encounter: Payer: Self-pay | Admitting: Internal Medicine

## 2019-12-26 ENCOUNTER — Other Ambulatory Visit: Payer: Self-pay

## 2019-12-26 ENCOUNTER — Ambulatory Visit (INDEPENDENT_AMBULATORY_CARE_PROVIDER_SITE_OTHER): Payer: Medicare Other | Admitting: Internal Medicine

## 2019-12-26 VITALS — BP 110/60 | HR 62 | Temp 97.9°F | Ht 69.0 in | Wt 187.0 lb

## 2019-12-26 DIAGNOSIS — Z0000 Encounter for general adult medical examination without abnormal findings: Secondary | ICD-10-CM | POA: Diagnosis not present

## 2019-12-26 DIAGNOSIS — R413 Other amnesia: Secondary | ICD-10-CM

## 2019-12-26 DIAGNOSIS — F418 Other specified anxiety disorders: Secondary | ICD-10-CM

## 2019-12-26 DIAGNOSIS — I6381 Other cerebral infarction due to occlusion or stenosis of small artery: Secondary | ICD-10-CM

## 2019-12-26 DIAGNOSIS — J309 Allergic rhinitis, unspecified: Secondary | ICD-10-CM | POA: Insufficient documentation

## 2019-12-26 DIAGNOSIS — Z23 Encounter for immunization: Secondary | ICD-10-CM | POA: Diagnosis not present

## 2019-12-26 DIAGNOSIS — R7989 Other specified abnormal findings of blood chemistry: Secondary | ICD-10-CM | POA: Diagnosis not present

## 2019-12-26 DIAGNOSIS — E785 Hyperlipidemia, unspecified: Secondary | ICD-10-CM

## 2019-12-26 DIAGNOSIS — J3089 Other allergic rhinitis: Secondary | ICD-10-CM

## 2019-12-26 DIAGNOSIS — N183 Chronic kidney disease, stage 3 unspecified: Secondary | ICD-10-CM | POA: Diagnosis not present

## 2019-12-26 DIAGNOSIS — N320 Bladder-neck obstruction: Secondary | ICD-10-CM

## 2019-12-26 DIAGNOSIS — I639 Cerebral infarction, unspecified: Secondary | ICD-10-CM

## 2019-12-26 NOTE — Assessment & Plan Note (Signed)
Zyrtec Add Flonase

## 2019-12-26 NOTE — Patient Instructions (Addendum)
You can try Lion's Mane Mushroom extract or capsules for memory   [facility] started vaccine booster sign up. Please call [place] Vaccine Line at [phone]. You can also call the venue where you had your initial COVID 19 vaccination.

## 2019-12-26 NOTE — Addendum Note (Signed)
Addended by: Cresenciano Lick on: 12/26/2019 10:40 AM   Modules accepted: Orders

## 2019-12-26 NOTE — Assessment & Plan Note (Signed)
try Lion's Mane Mushroom extract or capsules for memory 

## 2019-12-26 NOTE — Assessment & Plan Note (Signed)
Plavix, Pravastatin Better MRI reviewed w/pt

## 2019-12-26 NOTE — Assessment & Plan Note (Signed)
  We discussed age appropriate health related issues, including available/recomended screening tests and vaccinations. Labs were ordered to be later reviewed . All questions were answered. We discussed one or more of the following - seat belt use, use of sunscreen/sun exposure exercise, safe sex, fall risk reduction, second hand smoke exposure, firearm use and storage, seat belt use, a need for adhering to healthy diet and exercise. Labs were ordered. All questions were answered. Prevnar

## 2019-12-26 NOTE — Progress Notes (Signed)
Subjective:  Patient ID: Larry Stone, male    DOB: 1951/02/09  Age: 69 y.o. MRN: [id number]  CC: No chief complaint on file.   HPI Larry Stone presents for CVA - better, HTN, anxiety f/u  Outpatient Medications Prior to Visit  Medication Sig Dispense Refill  . albuterol (PROAIR HFA) 108 (90 Base) MCG/ACT inhaler Inhale 1-2 puffs into the lungs every 6 (six) hours as needed for wheezing or shortness of breath. 18 g 2  . albuterol (PROVENTIL) (2.5 MG/3ML) 0.083% nebulizer solution Use one vial in the nebulizer every 4-6 hours if needed for cough or wheeze 225 mL 11  . ALPRAZolam (XANAX) 0.5 MG tablet Take 1 tablet (0.5 mg total) by mouth 3 (three) times daily as needed. 90 tablet 1  . ammonium lactate (LAC-HYDRIN) 12 % lotion APPLY TOPICALLY AS DIRECTED. 400 mL 3  . budesonide-formoterol (SYMBICORT) 160-4.5 MCG/ACT inhaler INHALE 2 PUFFS INTO THE LUNGS 2 (TWO) TIMES DAILY. 3 Inhaler 0  . buPROPion (WELLBUTRIN SR) 150 MG 12 hr tablet Take 1 tablet (150 mg total) by mouth 2 (two) times daily. 180 tablet 0  . carvedilol (COREG) 25 MG tablet TAKE 1 TAB BY MOUTH 2 TIMES DAILY WITH A MEAL.ANNUAL APPT IN FEBUARY MUST SEE PROVIDER FOR REFILLS 180 tablet 3  . cetirizine (ZYRTEC) 10 MG tablet Take 1 tablet (10 mg total) by mouth daily. 30 tablet 11  . Cholecalciferol (VITAMIN D3) 5000 UNITS CAPS Take one tablet by mouth daily    . clopidogrel (PLAVIX) 75 MG tablet Take 1 tablet (75 mg total) by mouth daily. 90 tablet 0  . clotrimazole-betamethasone (LOTRISONE) cream APPLY AS DIRECTED 45 g 11  . Coenzyme Q10 (COQ10) 100 MG CAPS Take as directed 30 each 11  . ezetimibe (ZETIA) 10 MG tablet Take 1 tablet (10 mg total) by mouth daily. 90 tablet 3  . hydrALAZINE (APRESOLINE) 50 MG tablet Take 1 tablet (50 mg total) by mouth 3 (three) times daily. 270 tablet 3  . metFORMIN (GLUCOPHAGE) 500 MG tablet Take 1 tablet (500 mg total) by mouth daily with breakfast. 90 tablet 3  . montelukast (SINGULAIR) 10  MG tablet TAKE 1 TABLET BY MOUTH EVERYDAY AT BEDTIME 90 tablet 3  . nitroGLYCERIN (NITROSTAT) 0.4 MG SL tablet PLACE 1 TABLET UNDER THE TONGUE EVERY 5 MINUTES AS NEEDED. FOR UP TO 3 DOSES. 25 tablet 2  . pantoprazole (PROTONIX) 40 MG tablet TAKE 1 TABLET BY MOUTH EVERY DAY (Patient not taking: Reported on 12/06/2019) 90 tablet 3  . pravastatin (PRAVACHOL) 40 MG tablet Take 1 tablet (40 mg total) by mouth daily. 90 tablet 3  . sildenafil (VIAGRA) 100 MG tablet TAKE 1 TABLET BY MOUTH AS NEEDED FOR ERECTILE DYSFUNCTION. 30 tablet 3  . vitamin C (ASCORBIC ACID) 500 MG tablet Take 500 mg by mouth daily. (Patient not taking: Reported on 12/06/2019)     No facility-administered medications prior to visit.    ROS: Review of Systems  Constitutional: Negative for appetite change, fatigue and unexpected weight change.  HENT: Negative for congestion, nosebleeds, sneezing, sore throat and trouble swallowing.   Eyes: Negative for itching and visual disturbance.  Respiratory: Negative for cough.   Cardiovascular: Negative for chest pain, palpitations and leg swelling.  Gastrointestinal: Negative for abdominal distention, blood in stool, diarrhea and nausea.  Genitourinary: Negative for frequency and hematuria.  Musculoskeletal: Negative for back pain, gait problem, joint swelling and neck pain.  Skin: Negative for rash.  Neurological: Negative for dizziness,  tremors, speech difficulty and weakness.  Psychiatric/Behavioral: Negative for agitation, dysphoric mood and sleep disturbance. The patient is nervous/anxious.     Objective:  BP 110/60 (BP Location: Left Arm, Patient Position: Sitting, Cuff Size: Large)   Pulse 62   Temp 97.9 F (36.6 C) (Oral)   Ht 5\' 9"  (1.753 m)   Wt 187 lb (84.8 kg)   SpO2 98%   BMI 27.62 kg/m   BP Readings from Last 3 Encounters:  12/26/19 110/60  12/11/19 (!) 150/80  12/06/19 116/70    Wt Readings from Last 3 Encounters:  12/26/19 187 lb (84.8 kg)  12/06/19 197  lb (89.4 kg)  12/04/19 200 lb (90.7 kg)    Physical Exam Constitutional:      General: He is not in acute distress.    Appearance: He is well-developed.     Comments: NAD  Eyes:     Conjunctiva/sclera: Conjunctivae normal.     Pupils: Pupils are equal, round, and reactive to light.  Neck:     Thyroid: No thyromegaly.     Vascular: No JVD.  Cardiovascular:     Rate and Rhythm: Normal rate and regular rhythm.     Heart sounds: Normal heart sounds. No murmur heard.  No friction rub. No gallop.   Pulmonary:     Effort: Pulmonary effort is normal. No respiratory distress.     Breath sounds: Normal breath sounds. No wheezing or rales.  Chest:     Chest wall: No tenderness.  Abdominal:     General: Bowel sounds are normal. There is no distension.     Palpations: Abdomen is soft. There is no mass.     Tenderness: There is no abdominal tenderness. There is no guarding or rebound.  Musculoskeletal:        General: No tenderness. Normal range of motion.     Cervical back: Normal range of motion.  Lymphadenopathy:     Cervical: No cervical adenopathy.  Skin:    General: Skin is warm and dry.     Findings: No rash.  Neurological:     Mental Status: He is alert and oriented to person, place, and time.     Cranial Nerves: No cranial nerve deficit.     Motor: No abnormal muscle tone.     Coordination: Coordination normal.     Gait: Gait normal.     Deep Tendon Reflexes: Reflexes are normal and symmetric.  Psychiatric:        Behavior: Behavior normal.        Thought Content: Thought content normal.        Judgment: Judgment normal.    R hand - much better  Lab Results  Component Value Date   WBC 8.3 12/04/2019   HGB 12.9 (L) 12/04/2019   HCT 38.0 (L) 12/04/2019   PLT 170 12/04/2019   GLUCOSE 85 12/04/2019   CHOL 127 06/19/2019   TRIG 63.0 06/19/2019   HDL 42.90 06/19/2019   LDLCALC 71 06/19/2019   ALT 20 12/04/2019   AST 20 12/04/2019   NA 141 12/04/2019   K 4.2  12/04/2019   CL 104 12/04/2019   CREATININE 1.30 (H) 12/04/2019   BUN 20 12/04/2019   CO2 25 12/04/2019   TSH 1.91 11/29/2018   PSA 0.44 11/29/2018   INR 1.1 12/04/2019   HGBA1C 5.9 06/19/2019    MR Brain W Wo Contrast  Result Date: 12/20/2019 CLINICAL DATA:  Acute stroke suspected. Right-sided numbness. Memory loss and confusion. EXAM:  MRI HEAD WITHOUT AND WITH CONTRAST TECHNIQUE: Multiplanar, multiecho pulse sequences of the brain and surrounding structures were obtained without and with intravenous contrast. CONTRAST:  55mL GADAVIST GADOBUTROL 1 MMOL/ML IV SOLN COMPARISON:  Head CT 12/04/2019 FINDINGS: Brain: Subacute infarction of the left lateral thalamus measuring about a cm in size. No other acute or subacute insult. Chronic small-vessel ischemic changes affect the pons. Old small vessel infarction of the superior cerebellum on the right. Cerebral hemispheres elsewhere show minimal small vessel change of the white matter. No large vessel territory infarction. No mass lesion, hemorrhage, hydrocephalus or extra-axial collection. After contrast administration, there is mild enhancement the left thalamic stroke. Vascular: Major vessels at the base of the brain show flow. Skull and upper cervical spine: Negative Sinuses/Orbits: Mucosal inflammatory changes of the paranasal sinuses, most pronounced in the right maxillary sinus. Orbits negative. Other: None IMPRESSION: 1. Subacute infarction of the left lateral thalamus measuring about a cm in size. No evidence of hemorrhage or mass effect. Some contrast enhancement present in this location, as often seen and subacute infarctions. 2. Chronic small-vessel ischemic changes elsewhere throughout the brain as outlined above. 3. Mucosal inflammatory changes of the paranasal sinuses, most pronounced in the right maxillary sinus. Electronically Signed   By: Nelson Chimes M.D.   On: 12/20/2019 08:44    Assessment & Plan:    [name], MD

## 2019-12-26 NOTE — Assessment & Plan Note (Signed)
Better  

## 2019-12-26 NOTE — Addendum Note (Signed)
Addended by: Lauralee Evener C on: 12/26/2019 03:08 PM   Modules accepted: Orders

## 2019-12-26 NOTE — Assessment & Plan Note (Signed)
Coreg, Hydralazine 

## 2019-12-26 NOTE — Assessment & Plan Note (Signed)
Labs

## 2019-12-27 ENCOUNTER — Encounter: Payer: Self-pay | Admitting: Occupational Therapy

## 2019-12-27 ENCOUNTER — Encounter: Payer: Self-pay | Admitting: Physical Therapy

## 2019-12-27 ENCOUNTER — Ambulatory Visit: Payer: Medicare Other | Admitting: Occupational Therapy

## 2019-12-27 ENCOUNTER — Ambulatory Visit: Payer: Medicare Other | Admitting: Physical Therapy

## 2019-12-27 DIAGNOSIS — R260 Ataxic gait: Secondary | ICD-10-CM

## 2019-12-27 DIAGNOSIS — R208 Other disturbances of skin sensation: Secondary | ICD-10-CM

## 2019-12-27 DIAGNOSIS — R2681 Unsteadiness on feet: Secondary | ICD-10-CM

## 2019-12-27 DIAGNOSIS — R278 Other lack of coordination: Secondary | ICD-10-CM

## 2019-12-27 DIAGNOSIS — I69351 Hemiplegia and hemiparesis following cerebral infarction affecting right dominant side: Secondary | ICD-10-CM

## 2019-12-27 DIAGNOSIS — M6281 Muscle weakness (generalized): Secondary | ICD-10-CM

## 2019-12-27 DIAGNOSIS — R2689 Other abnormalities of gait and mobility: Secondary | ICD-10-CM

## 2019-12-27 LAB — CBC WITH DIFFERENTIAL/PLATELET
Absolute Monocytes: 931 cells/uL (ref 200–950)
Basophils Absolute: 50 cells/uL (ref 0–200)
Basophils Relative: 0.5 %
Eosinophils Absolute: 634 cells/uL — ABNORMAL HIGH (ref 15–500)
Eosinophils Relative: 6.4 %
HCT: 43 % (ref 38.5–50.0)
Hemoglobin: 14.5 g/dL (ref 13.2–17.1)
Lymphs Abs: 1485 cells/uL (ref 850–3900)
MCH: 31.6 pg (ref 27.0–33.0)
MCHC: 33.7 g/dL (ref 32.0–36.0)
MCV: 93.7 fL (ref 80.0–100.0)
MPV: 10.7 fL (ref 7.5–12.5)
Monocytes Relative: 9.4 %
Neutro Abs: 6801 cells/uL (ref 1500–7800)
Neutrophils Relative %: 68.7 %
Platelets: 179 10*3/uL (ref 140–400)
RBC: 4.59 10*6/uL (ref 4.20–5.80)
RDW: 12.3 % (ref 11.0–15.0)
Total Lymphocyte: 15 %
WBC: 9.9 10*3/uL (ref 3.8–10.8)

## 2019-12-27 LAB — LIPID PANEL
Cholesterol: 144 mg/dL (ref <200)
HDL: 49 mg/dL (ref >=40)
LDL Cholesterol (Calc): 78 mg/dL (calc)
Non-HDL Cholesterol (Calc): 95 mg/dL (calc) (ref <130)
Total CHOL/HDL Ratio: 2.9 (calc) (ref <5.0)
Triglycerides: 91 mg/dL (ref <150)

## 2019-12-27 LAB — COMPLETE METABOLIC PANEL WITH GFR
AG Ratio: 1.6 (calc) (ref 1.0–2.5)
ALT: 16 U/L (ref 9–46)
AST: 19 U/L (ref 10–35)
Albumin: 4.2 g/dL (ref 3.6–5.1)
Alkaline phosphatase (APISO): 71 U/L (ref 35–144)
BUN/Creatinine Ratio: 12 (calc) (ref 6–22)
BUN: 18 mg/dL (ref 7–25)
CO2: 28 mmol/L (ref 20–32)
Calcium: 9.7 mg/dL (ref 8.6–10.3)
Chloride: 101 mmol/L (ref 98–110)
Creat: 1.54 mg/dL — ABNORMAL HIGH (ref 0.70–1.25)
GFR, Est African American: 53 mL/min/{1.73_m2} — ABNORMAL LOW (ref >=60)
GFR, Est Non African American: 45 mL/min/{1.73_m2} — ABNORMAL LOW (ref >=60)
Globulin: 2.6 g/dL (calc) (ref 1.9–3.7)
Glucose, Bld: 86 mg/dL (ref 65–99)
Potassium: 4.4 mmol/L (ref 3.5–5.3)
Sodium: 139 mmol/L (ref 135–146)
Total Bilirubin: 0.9 mg/dL (ref 0.2–1.2)
Total Protein: 6.8 g/dL (ref 6.1–8.1)

## 2019-12-27 LAB — PSA: PSA: 0.5 ng/mL (ref <=4.0)

## 2019-12-27 NOTE — Progress Notes (Signed)
Chief Complaint  Patient presents with  . Follow-up    CAD    History of Present Illness: 69 yo male with history of CAD, HLD, HTN, OSA and chronic diastolic CHF here today for cardiac follow up. 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 Stress myoview in June 2015 with inferolateral scar and no ischemia. When I saw him in February 2018 he was c/o dyspnea and fatigue. Cardiac cath on 06/17/16 with 40% left main stenosis, mild disease in the diagonal and proximal circumflex. He has history of myalgias with Lipitor and Crestor so he has been on Pravastatin. He is allergic to Ace-inhibitors and ARBs and has has lower extremity edema with Norvasc. He was started on hydralazine in May of 2020. We have discussed Repatha but he has not wished to start due to cost. He had a stroke mid August 2021. He left the ED waiting area before being seen by a physician. EKG with sinus that night. Dr. [name] saw him in the office and arrange a heat CT and brain MRI which confirmed the stroke. Carotid artery dopplers August 2021 with mild bilateral carotid artery disease.   He is here today for follow up. The patient denies any chest pain, dyspnea, palpitations, lower extremity edema, orthopnea, PND, dizziness, near syncope or syncope. He is here today to discuss his stroke. He has not yet seen neurology. No neuro deficits.  Primary Care Physician: Cassandria Anger, MD  Past Medical History:  Diagnosis Date  . Anxiety   . CAD    a. acute inferior lateral wall infarction in September 2004 treated medically. b.  cath 06/17/16 showing mild nonobstructive CAD with 30-40% ostial LM (eccentric), elevated LV filling pressures and normal LV function.  . Chronic diastolic CHF (congestive heart failure) ([place])   . CKD (chronic kidney disease), stage II   . COPD (chronic obstructive pulmonary disease) ([place])   . Diabetes mellitus   .  Diverticulosis   . DJD (degenerative joint disease)   . GERD (gastroesophageal reflux disease)   . HTN (hypertension)   . Hyperlipidemia   . Low back pain syndrome   . Myocardial infarction ([place]) 2004  . Obesity   . OSA (obstructive sleep apnea)   . RBBB   . Recurrent aspiration bronchitis/pneumonia   . Recurrent aspiration pneumonia ([place])    [name] 07/13/2016  . Thrombocytopenia ([place])   . Tubular adenoma of colon 2014    Past Surgical History:  Procedure Laterality Date  . CARDIAC CATHETERIZATION    . LEFT HEART CATH AND CORONARY ANGIOGRAPHY N/A 06/17/2016   Procedure: Left Heart Cath and Coronary Angiography;  Surgeon: Burnell Blanks, MD;  Location: [facility];  Service: Cardiovascular;  Laterality: N/A;  . UVULOPALATOPHARYNGOPLASTY     surgery for OSA    Current Outpatient Medications  Medication Sig Dispense Refill  . albuterol (PROAIR HFA) 108 (90 Base) MCG/ACT inhaler Inhale 1-2 puffs into the lungs every 6 (six) hours as needed for wheezing or shortness of breath. 18 g 2  . albuterol (PROVENTIL) (2.5 MG/3ML) 0.083% nebulizer solution Use one vial in the nebulizer every 4-6 hours if needed for cough or wheeze 225 mL 11  . ALPRAZolam (XANAX) 0.5 MG tablet Take 1 tablet (0.5 mg total) by mouth 3 (three) times daily as needed. 90 tablet 1  . ammonium lactate (LAC-HYDRIN) 12 % lotion APPLY TOPICALLY AS DIRECTED. 400 mL  3  . budesonide-formoterol (SYMBICORT) 160-4.5 MCG/ACT inhaler INHALE 2 PUFFS INTO THE LUNGS 2 (TWO) TIMES DAILY. 3 Inhaler 0  . buPROPion (WELLBUTRIN SR) 150 MG 12 hr tablet Take 1 tablet (150 mg total) by mouth 2 (two) times daily. 180 tablet 0  . carvedilol (COREG) 25 MG tablet TAKE 1 TAB BY MOUTH 2 TIMES DAILY WITH A MEAL.ANNUAL APPT IN FEBUARY MUST SEE PROVIDER FOR REFILLS 180 tablet 3  . cetirizine (ZYRTEC) 10 MG tablet Take 1 tablet (10 mg total) by mouth daily. 30 tablet 11  . Cholecalciferol (VITAMIN D3) 5000 UNITS CAPS Take one tablet by  mouth daily    . clopidogrel (PLAVIX) 75 MG tablet Take 1 tablet (75 mg total) by mouth daily. 90 tablet 0  . clotrimazole-betamethasone (LOTRISONE) cream APPLY AS DIRECTED 45 g 11  . Coenzyme Q10 (COQ10) 100 MG CAPS Take as directed 30 each 11  . ezetimibe (ZETIA) 10 MG tablet Take 1 tablet (10 mg total) by mouth daily. 90 tablet 3  . hydrALAZINE (APRESOLINE) 50 MG tablet Take 1 tablet (50 mg total) by mouth 3 (three) times daily. 270 tablet 3  . metFORMIN (GLUCOPHAGE) 500 MG tablet Take 1 tablet (500 mg total) by mouth daily with breakfast. 90 tablet 3  . montelukast (SINGULAIR) 10 MG tablet TAKE 1 TABLET BY MOUTH EVERYDAY AT BEDTIME 90 tablet 3  . nitroGLYCERIN (NITROSTAT) 0.4 MG SL tablet PLACE 1 TABLET UNDER THE TONGUE EVERY 5 MINUTES AS NEEDED. FOR UP TO 3 DOSES. 25 tablet 2  . pravastatin (PRAVACHOL) 40 MG tablet Take 1 tablet (40 mg total) by mouth daily. 90 tablet 3  . sildenafil (VIAGRA) 100 MG tablet TAKE 1 TABLET BY MOUTH AS NEEDED FOR ERECTILE DYSFUNCTION. 30 tablet 3  . vitamin C (ASCORBIC ACID) 500 MG tablet Take 500 mg by mouth daily.      No current facility-administered medications for this visit.    Allergies  Allergen Reactions  . Ace Inhibitors Swelling  . Angiotensin Receptor Blockers Swelling  . Aspirin Hives  . Amlodipine     Leg swelling  . Crestor [Rosuvastatin Calcium]     myalgias  . Lipitor [Atorvastatin Calcium]     arthralgias  . Codeine Nausea And Vomiting  . Irbesartan Other (See Comments)    REACTION: allergic to ARBs w/ angioedema  . Ramipril Other (See Comments)    REACTION: Allergic to ACE's w/ angioedema...    Social History   Socioeconomic History  . Marital status: Married    Spouse name: Not on file  . Number of children: 1  . Years of education: Not on file  . Highest education level: Not on file  Occupational History  . Occupation: Probation officer of [employer]  Tobacco Use  . Smoking status: Former Smoker    Packs/day:  1.00    Years: 20.00    Pack years: 20.00    Types: Cigarettes, Cigars    Quit date: 05/08/1998    Years since quitting: 21.6  . Smokeless tobacco: Never Used  . Tobacco comment: quit in 2005  Vaping Use  . Vaping Use: Never used  Substance and Sexual Activity  . Alcohol use: Yes    Alcohol/week: 1.0 standard drink    Types: 1 Standard drinks or equivalent per week    Comment: social drinker  . Drug use: No  . Sexual activity: Yes  Other Topics Concern  . Not on file  Social History Narrative  . Not on file  Social Determinants of Health   Financial Resource Strain:   . Difficulty of Paying Living Expenses: Not on file  Food Insecurity:   . Worried About Charity fundraiser in the Last Year: Not on file  . Ran Out of Food in the Last Year: Not on file  Transportation Needs:   . Lack of Transportation (Medical): Not on file  . Lack of Transportation (Non-Medical): Not on file  Physical Activity:   . Days of Exercise per Week: Not on file  . Minutes of Exercise per Session: Not on file  Stress:   . Feeling of Stress : Not on file  Social Connections:   . Frequency of Communication with Friends and Family: Not on file  . Frequency of Social Gatherings with Friends and Family: Not on file  . Attends Religious Services: Not on file  . Active Member of Clubs or Organizations: Not on file  . Attends Archivist Meetings: Not on file  . Marital Status: Not on file  Intimate Partner Violence:   . Fear of Current or Ex-Partner: Not on file  . Emotionally Abused: Not on file  . Physically Abused: Not on file  . Sexually Abused: Not on file    Family History  Problem Relation Age of Onset  . Colon cancer Father   . Stroke Mother   . Heart disease Mother   . Hypertension Sister   . Diabetes Sister   . Colon cancer Paternal Uncle     Review of Systems:  As stated in the HPI and otherwise negative.   BP 110/70   Pulse 64   Ht 5\' 9"  (1.753 m)   Wt 187 lb  (84.8 kg)   SpO2 98%   BMI 27.62 kg/m   Physical Examination:  General: Well developed, well nourished, NAD  HEENT: OP clear, mucus membranes moist  SKIN: warm, dry. No rashes. Neuro: No focal deficits  Musculoskeletal: Muscle strength 5/5 all ext  Psychiatric: Mood and affect normal  Neck: No JVD, no carotid bruits, no thyromegaly, no lymphadenopathy.  Lungs:Clear bilaterally, no wheezes, rhonci, crackles Cardiovascular: Regular rate and rhythm. No murmurs, gallops or rubs. Abdomen:Soft. Bowel sounds present. Non-tender.  Extremities: No lower extremity edema. Pulses are 2 + in the bilateral DP/PT.  Echo 08/30/13: Left ventricle: The cavity size was normal. Systolic function was normal. The estimated ejection fraction was in the range of 55% to 60%. Probable mild hypokinesis of the basal-midinferolateral and inferior myocardium. Doppler parameters are consistent with abnormal left ventricular relaxation (grade 1 diastolic dysfunction). - Mitral valve: Calcified annulus.  Cardiac cath 06/17/16:  Ost Cx to Mid Cx lesion, 20 %stenosed.  Ost LM lesion, 40 %stenosed.  1st Diag lesion, 40 %stenosed.  The left ventricular systolic function is normal.  LV end diastolic pressure is normal.  The left ventricular ejection fraction is greater than 65% by visual estimate.  There is no mitral valve regurgitation.     EKG:  EKG is not ordered today. The ekg ordered today demonstrates    Recent Labs: 12/26/2019: ALT 16; BUN 18; Creat 1.54; Hemoglobin 14.5; Platelets 179; Potassium 4.4; Sodium 139   Lipid Panel    Component Value Date/Time   CHOL 144 12/26/2019 1040   TRIG 91 12/26/2019 1040   HDL 49 12/26/2019 1040   CHOLHDL 2.9 12/26/2019 1040   VLDL 12.6 06/19/2019 1253   LDLCALC 78 12/26/2019 1040     Wt Readings from Last 3 Encounters:  12/28/19 187 lb (  84.8 kg)  12/26/19 187 lb (84.8 kg)  12/06/19 197 lb (89.4 kg)     Other studies Reviewed: Additional studies/  records that were reviewed today include: . Review of the above records demonstrates:    Assessment and Plan:  1. CAD without angina: No chest pain. Cardiac cath March 2018 with mild to moderate non-obstructive CAD. Will continue Plavix, beta blocker and statin.    2. HYPERTENSION:  BP controlled. Continue current meds.     3. HYPERLIPIDEMIA: LDL near goal. Continue statin and Zetia.   4. Chronic diastolic CHF: No volume overload. Weight is stable. Lasix as needed.   5. Stroke: He has not seen neurology yet as he left the ED the night of his stroke before he could be seen. Carotid artery dopplers without significant disease. Will arrange an echo to assess LV systolic function. 30 day cardiac monitor. He is seeing Neurology next month.   Current medicines are reviewed at length with the patient today.  The patient does not have concerns regarding medicines.  The following changes have been made:  no change  Labs/ tests ordered today include:   Orders Placed This Encounter  Procedures  . CARDIAC EVENT MONITOR  . ECHOCARDIOGRAM COMPLETE    Disposition:   F/U with me 12 months  Signed, Lauree Chandler, MD 12/28/2019 12:15 PM    [facility] [address], [place], [place]  [address] Phone: [phone]; Fax: [phone]

## 2019-12-27 NOTE — Therapy (Signed)
[facility] [address] [place], [place], [address] Phone: [phone]   Fax:  [phone]  Occupational Therapy Treatment  Patient Details  Name: Larry Stone MRN: [id number] Date of Birth: July 21, 1950 Referring Provider (OT): Dr Lew Dawes   Encounter Date: 12/27/2019   OT End of Session - 12/27/19 1517    Visit Number 5    Number of Visits 17    Date for OT Re-Evaluation 03/10/20    Authorization Type [employer] Medicare  VL:MN    Progress Note Due on Visit 10    OT Start Time 1315    OT Stop Time 1400    OT Time Calculation (min) 45 min    Activity Tolerance Patient tolerated treatment well    Behavior During Therapy [facility] for tasks assessed/performed           Past Medical History:  Diagnosis Date  . Anxiety   . CAD    a. acute inferior lateral wall infarction in September 2004 treated medically. b.  cath 06/17/16 showing mild nonobstructive CAD with 30-40% ostial LM (eccentric), elevated LV filling pressures and normal LV function.  . Chronic diastolic CHF (congestive heart failure) ([place])   . CKD (chronic kidney disease), stage II   . COPD (chronic obstructive pulmonary disease) ([place])   . Diabetes mellitus   . Diverticulosis   . DJD (degenerative joint disease)   . GERD (gastroesophageal reflux disease)   . HTN (hypertension)   . Hyperlipidemia   . Low back pain syndrome   . Myocardial infarction ([place]) 2004  . Obesity   . OSA (obstructive sleep apnea)   . RBBB   . Recurrent aspiration bronchitis/pneumonia   . Recurrent aspiration pneumonia ([place])    [name] 07/13/2016  . Thrombocytopenia ([place])   . Tubular adenoma of colon 2014    Past Surgical History:  Procedure Laterality Date  . CARDIAC CATHETERIZATION    . LEFT HEART CATH AND CORONARY ANGIOGRAPHY N/A 06/17/2016   Procedure: Left Heart Cath and Coronary Angiography;  Surgeon: Burnell Blanks, MD;  Location: [facility];  Service:  Cardiovascular;  Laterality: N/A;  . UVULOPALATOPHARYNGOPLASTY     surgery for OSA    There were no vitals filed for this visit.   Subjective Assessment - 12/27/19 1322    Subjective  I haven't tried the driving yet.  It took me awhile to convince someone to drive with me.    Patient is accompanied by: Family member    Currently in Pain? No/denies    Pain Score 0-No pain                        OT Treatments/Exercises (OP) - 12/27/19 0001      Neurological Re-education Exercises   Other Exercises 1 Gross motor coordination to address RUE / RLE timing and sequencing with tossing and catching, passing various sized/ weighted balls.  Patient with tendency to move quickly, but has etter balance reactions and coordination.  Patient with improved Bilateral coordination versus last week.  Patient became slightly short of breath with activity - and we discussed this is common with stroke recovery.  He may feel it takes several months for level of energy to return to previous state.  Also discussed reactive depression/disinterest as not uncommon during recovery.  Patient feels that he is doing more functionally, and this seems to improve his overall outlook.  Patient continues to show excellent progress.  Other Exercises 2 Worked on coordination - bilaterally while seated - to build and reconstruct tower.  Emphasis on using R/LUE together.                    OT Education - 12/27/19 1517    Education Details activity tolerance, mood    Person(s) Educated Patient    Methods Explanation    Comprehension Verbalized understanding            OT Short Term Goals - 12/25/19 1800      OT SHORT TERM GOAL #1   Title Patient will complete a home exercise program designed to improve coordiantion in right hand independently 01/25/20    Time 4    Period Weeks    Status Achieved    Target Date 01/25/20      OT SHORT TERM GOAL #2   Title Patient will demonstrate effective use  of fork or spoon with right UE reporting decreased spilling/dropping    Time 4    Period Weeks    Status Achieved      OT SHORT TERM GOAL #3   Title Patient will report less dropping when picking up small objects with RUE    Time 4    Period Weeks    Status Achieved             OT Long Term Goals - 12/27/19 1344      OT LONG TERM GOAL #1   Period Weeks    Status On-going      OT LONG TERM GOAL #2   Title Patient will demonstrate awareness of driving recommendations    Time 8    Period Weeks    Status Achieved      OT LONG TERM GOAL #3   Title Patient will demonstrate effective use of hand tools - e.g. screwdriver, wrench, hammer with RUE as dominant    Time 8    Period Weeks    Status On-going      OT LONG TERM GOAL #4   Title Patient will demonstrate improved coordination in RUE as evidenced by 5 sec reduction in time on 9 hole peg test    Baseline 41.32    Time 8    Status Achieved                 Plan - 12/27/19 1517    Clinical Impression Statement Patient making excellent progress toward OT goals.    OT Occupational Profile and History Problem Focused Assessment - Including review of records relating to presenting problem    Occupational performance deficits (Please refer to evaluation for details): ADL's;IADL's;Leisure    Body Structure / Function / Physical Skills ADL;Coordination;Endurance    Rehab Potential Excellent    Clinical Decision Making Limited treatment options, no task modification necessary    Comorbidities Affecting Occupational Performance: May have comorbidities impacting occupational performance    Modification or Assistance to Complete Evaluation  No modification of tasks or assist necessary to complete eval    OT Frequency 2x / week    OT Duration 8 weeks    OT Treatment/Interventions Self-care/ADL training;Therapeutic exercise;Patient/family education;Splinting;Neuromuscular education;Therapeutic activities;Balance training;Manual  Therapy;DME and/or AE instruction    Plan Higher level coordination training - fine and gross motor    OT Home Exercise Plan coordination    Consulted and Agree with Plan of Care Patient           Patient will benefit from skilled therapeutic intervention in  order to improve the following deficits and impairments:   Body Structure / Function / Physical Skills: ADL, Coordination, Endurance       Visit Diagnosis: Hemiplegia and hemiparesis following cerebral infarction affecting right dominant side (HCC)  Unsteadiness on feet  Decreased coordination  Muscle weakness (generalized)  Other disturbances of skin sensation    Problem List Patient Active Problem List   Diagnosis Date Noted  . Allergic rhinitis 12/26/2019  . Cerebrovascular accident (CVA) of left thalamus ([place]) 12/05/2019  . Right hemiparesis ([place]) 12/04/2019  . Chronic renal insufficiency, stage 3 (moderate) 07/05/2019  . Mild cognitive disorder 10/12/2017  . Viral pneumonitis 07/22/2016  . COPD with asthma ([place]) 07/22/2016  . Depression with anxiety 07/13/2016  . Acute respiratory failure with hypoxia ([place]) 07/13/2016  . Chronic diastolic CHF (congestive heart failure) ([place]) 07/13/2016  . Sepsis, unspecified organism ([place]) 07/13/2016  . Acute pain of right knee 06/30/2016  . Fatigue 06/30/2016  . Creatinine elevation 06/30/2016  . Dyspnea on exertion   . COPD with acute exacerbation ([place]) 04/04/2015  . Elevated IgE level 03/18/2014  . Well adult exam 03/05/2014  . COPD with chronic bronchitis ([place]) 12/13/2013  . Venous insufficiency 11/03/2011  . Edema 11/03/2011  . Memory disturbance 05/13/2011  . CAD (coronary artery disease) 09/17/2010  . 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 Incidental pulmonary nodule 06/17/2009  . COLONIC POLYPS 10/17/2007  . BRONCHITIS, RECURRENT 10/17/2007  . GERD (gastroesophageal reflux disease) 10/17/2007  . Diverticulosis of large intestine 10/17/2007  . DEGENERATIVE  JOINT DISEASE 10/17/2007  . Hyperglycemia 10/17/2007  . Dyslipidemia 10/02/2007  . Anxiety state 10/02/2007  . Obstructive sleep apnea 10/02/2007  . Essential hypertension 10/02/2007  . LOW BACK PAIN SYNDROME 10/02/2007    Mariah Milling, OTR/L 12/27/2019, 3:19 PM  [facility] [address] [place], [place], [address] Phone: [phone]   Fax:  [phone]  Name: Larry Stone MRN: [id number] Date of Birth: 04-Sep-1950

## 2019-12-28 ENCOUNTER — Ambulatory Visit (HOSPITAL_COMMUNITY): Payer: Medicare Other | Attending: Cardiovascular Disease

## 2019-12-28 ENCOUNTER — Telehealth: Payer: Self-pay | Admitting: Radiology

## 2019-12-28 ENCOUNTER — Other Ambulatory Visit: Payer: Self-pay

## 2019-12-28 ENCOUNTER — Ambulatory Visit: Payer: Medicare Other | Admitting: Cardiovascular Disease

## 2019-12-28 ENCOUNTER — Encounter: Payer: Self-pay | Admitting: Cardiovascular Disease

## 2019-12-28 ENCOUNTER — Ambulatory Visit: Payer: Medicare Other | Admitting: Occupational Therapy

## 2019-12-28 VITALS — BP 110/70 | HR 64 | Ht 69.0 in | Wt 187.0 lb

## 2019-12-28 DIAGNOSIS — Z8673 Personal history of transient ischemic attack (TIA), and cerebral infarction without residual deficits: Secondary | ICD-10-CM

## 2019-12-28 DIAGNOSIS — I2510 Atherosclerotic heart disease of native coronary artery without angina pectoris: Secondary | ICD-10-CM

## 2019-12-28 DIAGNOSIS — I5032 Chronic diastolic (congestive) heart failure: Secondary | ICD-10-CM | POA: Diagnosis not present

## 2019-12-28 DIAGNOSIS — I10 Essential (primary) hypertension: Secondary | ICD-10-CM | POA: Diagnosis not present

## 2019-12-28 DIAGNOSIS — I4891 Unspecified atrial fibrillation: Secondary | ICD-10-CM

## 2019-12-28 LAB — ECHOCARDIOGRAM COMPLETE
Area-P 1/2: 2.69 cm2
Height: 69 in
S' Lateral: 2.9 cm
Weight: 2992 oz

## 2019-12-28 NOTE — Telephone Encounter (Signed)
Enrolled patient for a 30 Day [employer] Event Monitor to be mailed to patients home.  

## 2019-12-28 NOTE — Patient Instructions (Signed)
Medication Instructions:  Your provider recommends that you continue on your current medications as directed. Please refer to the Current Medication list given to you today.   *If you need a refill on your cardiac medications before your next appointment, please call your pharmacy*  Testing/Procedures: Your provider has requested that you have an echocardiogram. Echocardiography is a painless test that uses sound waves to create images of your heart. It provides your doctor with information about the size and shape of your heart and how well your hearts chambers and valves are working. This procedure takes approximately one hour. There are no restrictions for this procedure.    Your physician has recommended that you wear an event monitor. Event monitors are medical devices that record the hearts electrical activity. Doctors most often [place] these monitors to diagnose arrhythmias. Arrhythmias are problems with the speed or rhythm of the heartbeat. The monitor is a small, portable device. You can wear one while you do your normal daily activities. This is usually used to diagnose what is causing palpitations/syncope (passing out). This will be mailed to your home.   Follow-Up: Your provider recommends that you schedule a follow-up appointment in 3 MONTHS.   [employer] Cardiac Event Monitor Instructions Your physician has requested you wear your cardiac event monitor for 30 days. [employer] may call or text to confirm a shipping address. The monitor will be sent to a land address via UPS. [employer] will not ship a monitor to a PO BOX. It typically takes 3-5 days to receive your monitor after it has been enrolled. [employer] will assist with USPS tracking if your package is delayed. The telephone number for [employer] is [phone]. Once you have received your monitor, please review the enclosed instructions. Instruction tutorials can also be viewed under help and settings on the enclosed cell  phone. Your monitor has already been registered assigning a specific monitor serial # to you.  Applying the monitor Remove cell phone from case and turn it on. The cell phone works as Dealer and needs to be within [employer] of you at all times. The cell phone will need to be charged on a daily basis. We recommend you plug the cell phone into the enclosed charger at your bedside table every night.  Monitor batteries: You will receive two monitor batteries labelled #1 and #2. These are your recorders. Plug battery #2 onto the second connection on the enclosed charger. Keep one battery on the charger at all times. This will keep the monitor battery deactivated. It will also keep it fully charged for when you need to switch your monitor batteries. A small light will be blinking on the battery emblem when it is charging. The light on the battery emblem will remain on when the battery is fully charged.  Open package of a Monitor strip. Insert battery #1 into black hood on strip and gently squeeze monitor battery onto connection as indicated in instruction booklet. Set aside while preparing skin.  Choose location for your strip, vertical or horizontal, as indicated in the instruction booklet. Shave to remove all hair from location. There cannot be any lotions, oils, powders, or colognes on skin where monitor is to be applied. Wipe skin clean with enclosed Saline wipe. Dry skin completely.  Peel paper labeled #1 off the back of the Monitor strip exposing the adhesive. Place the monitor on the chest in the vertical or horizontal position shown in the instruction booklet. One arrow on the monitor strip must be pointing  upward. Carefully remove paper labeled #2, attaching remainder of strip to your skin. Try not to create any folds or wrinkles in the strip as you apply it.  Firmly press and release the circle in the center of the monitor battery. You will hear a small beep. This is turning  the monitor battery on. The heart emblem on the monitor battery will light up every 5 seconds if the monitor battery in turned on and connected to the patient securely. Do not push and hold the circle down as this turns the monitor battery off. The cell phone will locate the monitor battery. A screen will appear on the cell phone checking the connection of your monitor strip. This may read poor connection initially but change to good connection within the next minute. Once your monitor accepts the connection you will hear a series of 3 beeps followed by a climbing crescendo of beeps. A screen will appear on the cell phone showing the two monitor strip placement options. Touch the picture that demonstrates where you applied the monitor strip.  Your monitor strip and battery are waterproof. You are able to shower, bathe, or swim with the monitor on. They just ask you do not submerge deeper than 3 feet underwater. We recommend removing the monitor if you are swimming in a lake, river, or ocean.  Your monitor battery will need to be switched to a fully charged monitor battery approximately once a week. The cell phone will alert you of an action which needs to be made.  On the cell phone, tap for details to reveal connection status, monitor battery status, and cell phone battery status. The green dots indicates your monitor is in good status. A red dot indicates there is something that needs your attention.  To record a symptom, click the circle on the monitor battery. In 30-60 seconds a list of symptoms will appear on the cell phone. Select your symptom and tap save. Your monitor will record a sustained or significant arrhythmia regardless of you clicking the button. Some patients do not feel the heart rhythm irregularities. [employer] will notify us of any serious or critical events.  Refer to instruction booklet for instructions on switching batteries, changing strips, the Do not disturb or  Pause features, or any additional questions.  Call [employer] at [phone], to confirm your monitor is transmitting and record your baseline. They will answer any questions you may have regarding the monitor instructions at that time.  Returning the monitor to [employer] all equipment back into blue box. Peel off strip of paper to expose adhesive and close box securely. There is a prepaid UPS shipping label on this box. Drop in a UPS drop box, or at a UPS facility like [employer]. You may also contact [employer] to arrange UPS to pick up monitor package at your home.

## 2019-12-28 NOTE — Therapy (Addendum)
[facility] [address] [place], [place], [address] Phone: [phone]   Fax:  [phone]  Physical Therapy Treatment  Patient Details  Name: Larry Stone MRN: [id number] Date of Birth: Apr 07, 1951 Referring Provider (PT): Dr. Lew Dawes   Encounter Date: 12/27/2019   PT End of Session - 12/27/19 1403    Visit Number 6    Number of Visits 13   eval to 12 visits   Authorization Type [facility] Medicare    Authorization Time Period 12-06-19 - 03-04-20    PT Start Time 1402    PT Stop Time 1445    PT Time Calculation (min) 43 min    Equipment Utilized During Treatment Gait belt    Activity Tolerance Patient tolerated treatment well    Behavior During Therapy [facility] for tasks assessed/performed           Past Medical History:  Diagnosis Date  . Anxiety   . CAD    a. acute inferior lateral wall infarction in September 2004 treated medically. b.  cath 06/17/16 showing mild nonobstructive CAD with 30-40% ostial LM (eccentric), elevated LV filling pressures and normal LV function.  . Chronic diastolic CHF (congestive heart failure) ([place])   . CKD (chronic kidney disease), stage II   . COPD (chronic obstructive pulmonary disease) ([place])   . Diabetes mellitus   . Diverticulosis   . DJD (degenerative joint disease)   . GERD (gastroesophageal reflux disease)   . HTN (hypertension)   . Hyperlipidemia   . Low back pain syndrome   . Myocardial infarction ([place]) 2004  . Obesity   . OSA (obstructive sleep apnea)   . RBBB   . Recurrent aspiration bronchitis/pneumonia   . Recurrent aspiration pneumonia ([place])    [name] 07/13/2016  . Thrombocytopenia ([place])   . Tubular adenoma of colon 2014    Past Surgical History:  Procedure Laterality Date  . CARDIAC CATHETERIZATION    . LEFT HEART CATH AND CORONARY ANGIOGRAPHY N/A 06/17/2016   Procedure: Left Heart Cath and Coronary Angiography;  Surgeon: Burnell Blanks, MD;   Location: [place] [facility];  Service: Cardiovascular;  Laterality: N/A;  . UVULOPALATOPHARYNGOPLASTY     surgery for OSA    There were no vitals filed for this visit.   Subjective Assessment - 12/27/19 1403    Subjective No new complaints. No falls or pain to report.    Pertinent History s/p Lt  thalamic CVA:  Anxiety, essential HTN, DJD, CAD, COPD with chronic bronchitis, chronic diaastolic CHF, h/o MI in 0947: 06-17-16 L heart catheterization & coroncay angiography    Diagnostic tests CT and MRI both done.    Patient Stated Goals Improve balance and walking    Currently in Pain? No/denies    Pain Score 0-No pain              [facility] PT Assessment - 12/27/19 1405      Berg Balance Test   Sit to Stand Able to stand without using hands and stabilize independently    Standing Unsupported Able to stand safely 2 minutes    Sitting with Back Unsupported but Feet Supported on Floor or Stool Able to sit safely and securely 2 minutes    Stand to Sit Sits safely with minimal use of hands    Transfers Able to transfer safely, minor use of hands    Standing Unsupported with Eyes Closed Able to stand 10 seconds safely    Standing Unsupported  with Feet Together Able to place feet together independently and stand 1 minute safely    From Standing, Reach Forward with Outstretched Arm Can reach confidently >25 cm (10")    From Standing Position, Pick up Object from [place] to pick up shoe safely and easily    From Standing Position, Turn to Look Behind Over each Shoulder Looks behind one side only/other side shows less weight shift   right> left   Turn 360 Degrees Able to turn 360 degrees safely in 4 seconds or less    Standing Unsupported, Alternately Place Feet on Step/Stool Able to stand independently and safely and complete 8 steps in 20 seconds   10.22 sec's   Standing Unsupported, One Foot in Front Able to plae foot ahead of the other independently and hold 30 seconds    Standing on One Leg  Able to lift leg independently and hold > 10 seconds    Total Score 54    Berg comment: 54/56= lower risk for falls                         [facility] Adult PT Treatment/Exercise - 12/27/19 1405      Transfers   Transfers Sit to Stand;Stand to Sit    Sit to Stand 6: Modified independent (Device/Increase time)    Stand to Sit 6: Modified independent (Device/Increase time)      Ambulation/Gait   Ambulation/Gait Yes    Ambulation/Gait Assistance 5: Supervision;4: Min guard    Ambulation/Gait Assistance Details min guard assist with gait outdoors with cues to look up vs at ground, no imbalance noted; supervision indoors with narrow spaces and turns with more forward gaze    Ambulation Distance (Feet) 500 Feet   x1 in/outdoors; 630 x1 indoors   Assistive device None    Gait Pattern Step-through pattern;Ataxic   occasional shuffle noted with gait   Ambulation Surface Level;Unlevel;Indoor;Outdoor;Paved    Gait velocity 9.50 sec's= 3.45 ft.sec no AD      Self-Care   Self-Care Other Self-Care Comments    Other Self-Care Comments  pt independent with current program. Is also doing some things on his own.                Balance Exercises - 12/27/19 1431      Balance Exercises: Standing   Standing Eyes Closed Narrow base of support (BOS);Wide (BOA);Head turns;Foam/compliant surface;Other reps (comment);30 secs;Limitations    Standing Eyes Closed Limitations on airex no UE support: narrow base of support- EC no head movements, progressing to wide base of support for EC head movements left<>right, up<>down. up to min assist for balance with posterior sway noted.      Tandem Gait Forward;Retro;Upper extremity support;Foam/compliant surface;3 reps;Limitations    Tandem Gait Limitations on blue foam beam    Sidestepping Foam/compliant support;3 reps;Limitations    Sidestepping Limitations on blue foam beam:             PT Short Term Goals - 12/27/19 1404      PT SHORT TERM  GOAL #1   Title Improve Berg balance test score from 46/56 to >/= 49/56 to reduce fall risk.    Baseline 12/27/19: 52/54 scored today    Time --   extended due to delay in start of treatment   Period --    Status Achieved    Target Date 12/28/19      PT SHORT TERM GOAL #2   Title Pt will  amb. 500' with no device on flat even surface with supervision.    Baseline 12/27/19: met in session today    Time --    Period --    Status Achieved    Target Date 12/28/19      PT SHORT TERM GOAL #3   Title Increase gait velocity by at least .4 ft/sec for incr. gait efficiency.    Baseline 12/27/19:    Time --    Period --    Status Achieved    Target Date 12/28/19      PT SHORT TERM GOAL #4   Title Independent in HEP for balance and coordination exercises.    Baseline 12/27/19: met with current program, plus some things he does on his own    Status Achieved                PT Long Term Goals - 12/27/19 1431      PT LONG TERM GOAL #1   Title Improve Berg balance score to >/= 52/56 to reduce fall risk. (LTGs due 01/18/2020)    Baseline 46/56 on 12-06-19    Time 6    Period Weeks    Status New      PT LONG TERM GOAL #2   Title Pt will amb. 1000' on flat even and uneven surface without device modified independently with pt demonstrating ability to look ahead rather than down at feet.    Time 6    Status New      PT LONG TERM GOAL #3   Title Pt will report negotiating steps at home to 2nd level (rather than using chair lift) modified independently.    Time 6    Period Weeks    Status New      PT LONG TERM GOAL #4   Title Independent in updated HEP for balance and coordination exercises.    Time 6    Period Weeks    Status New      PT LONG TERM GOAL #5   Title Improve FGA score by at least 5 points to increase safety with gait and to reduce fall risk.    Baseline FGA on 12/11/19 14/30    Time 6    Period Weeks    Status New                 Plan - 12/27/19 1404     Clinical Impression Statement Today's skilled session intially focused on progress toward STGs with all goals met. Remainder of session continued to focus on balance reactions on compliant surfaces. The pt is progressing toward goals and should benefit from continued PT to progress toward unmet goals.    Personal Factors and Comorbidities Behavior Pattern;Comorbidity 2    Comorbidities anxiety, HTN, DJD, CAD, COPD with chronic bronchitis, chronic diastolic CHF, 09-23-32 lt heart cath. & coronary angiography, s/p MI 2004    Examination-Activity Limitations Bend;Stairs;Squat;Locomotion Level;Transfers    Examination-Participation Restrictions Cleaning;Shop;Yard Work;Other;Community Activity -  Architect work activities   Stability/Clinical Decision Making Evolving/Moderate complexity    Rehab Potential Good    PT Frequency 2x / week    PT Duration 6 weeks    PT Treatment/Interventions ADLs/Self Care Home Management;DME Instruction;Gait training;Stair training;Therapeutic activities;Therapeutic exercise;Balance training;Neuromuscular re-education;Patient/family education    PT Next Visit Plan high level dynamic balance exercises and coordination ex. RLE:  gait training without device    PT Home Exercise Plan coordination ex. RLE in standing (12-06-19)    Consulted and Agree  with Plan of Care Patient;Family member/caregiver    Family Member Consulted daughter           Patient will benefit from skilled therapeutic intervention in order to improve the following deficits and impairments:  Abnormal gait, Decreased activity tolerance, Decreased balance, Decreased coordination, Decreased strength, Impaired sensation  Visit Diagnosis: Hemiplegia and hemiparesis following cerebral infarction affecting right dominant side (HCC)  Unsteadiness on feet  Muscle weakness (generalized)  Ataxic gait  Other abnormalities of gait and mobility     Problem List Patient Active Problem List   Diagnosis  Date Noted  . Allergic rhinitis 12/26/2019  . Cerebrovascular accident (CVA) of left thalamus ([place]) 12/05/2019  . Right hemiparesis ([place]) 12/04/2019  . Chronic renal insufficiency, stage 3 (moderate) 07/05/2019  . Mild cognitive disorder 10/12/2017  . Viral pneumonitis 07/22/2016  . COPD with asthma ([place]) 07/22/2016  . Depression with anxiety 07/13/2016  . Acute respiratory failure with hypoxia ([place]) 07/13/2016  . Chronic diastolic CHF (congestive heart failure) ([place]) 07/13/2016  . Sepsis, unspecified organism ([place]) 07/13/2016  . Acute pain of right knee 06/30/2016  . Fatigue 06/30/2016  . Creatinine elevation 06/30/2016  . Dyspnea on exertion   . COPD with acute exacerbation ([place]) 04/04/2015  . Elevated IgE level 03/18/2014  . Well adult exam 03/05/2014  . COPD with chronic bronchitis ([place]) 12/13/2013  . Venous insufficiency 11/03/2011  . Edema 11/03/2011  . Memory disturbance 05/13/2011  . CAD (coronary artery disease) 09/17/2010  . 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 Incidental pulmonary nodule 06/17/2009  . COLONIC POLYPS 10/17/2007  . BRONCHITIS, RECURRENT 10/17/2007  . GERD (gastroesophageal reflux disease) 10/17/2007  . Diverticulosis of large intestine 10/17/2007  . DEGENERATIVE JOINT DISEASE 10/17/2007  . Hyperglycemia 10/17/2007  . Dyslipidemia 10/02/2007  . Anxiety state 10/02/2007  . Obstructive sleep apnea 10/02/2007  . Essential hypertension 10/02/2007  . LOW BACK PAIN SYNDROME 10/02/2007    Willow Ora, PTA, [facility] [address], [place], [place] [address] [phone] 12/28/19, 4:34 PM   Name: Larry Stone MRN: [id number] Date of Birth: 18-Jan-1951

## 2019-12-29 ENCOUNTER — Other Ambulatory Visit: Payer: Medicare Other

## 2020-01-01 ENCOUNTER — Other Ambulatory Visit: Payer: Self-pay

## 2020-01-01 ENCOUNTER — Encounter: Payer: Self-pay | Admitting: Physical Therapy

## 2020-01-01 ENCOUNTER — Ambulatory Visit: Payer: Medicare Other | Admitting: Physical Therapy

## 2020-01-01 DIAGNOSIS — R278 Other lack of coordination: Secondary | ICD-10-CM

## 2020-01-01 DIAGNOSIS — R2689 Other abnormalities of gait and mobility: Secondary | ICD-10-CM

## 2020-01-01 DIAGNOSIS — R2681 Unsteadiness on feet: Secondary | ICD-10-CM

## 2020-01-01 DIAGNOSIS — I69351 Hemiplegia and hemiparesis following cerebral infarction affecting right dominant side: Secondary | ICD-10-CM | POA: Diagnosis not present

## 2020-01-01 NOTE — Patient Instructions (Signed)
Heel Raise: Unilateral (Standing)    Balance on left foot, then rise on ball of foot. Repeat __15__ times per set. Do __3__ sets per session. Do _1-2__ sessions per day.  STAND on Right leg only - hold onto counter or object - lift up on toes 15 times - try to hold 2 secsBraiding    Move to side: 1) cross right leg in front of left, 2) bring back leg out to side, then 3) cross right leg behind left, 4) bring left leg out to side. Continue sequence in same direction. Reverse sequence, moving in opposite direction. Repeat sequence _3-4___ times per session. Do __1-2__ sessions per day.  Hold onto counter as needed for balance    Copyright  VHI. All rights reserved.    http://orth.exer.us/40   Copyright  VHI. All rights reserved.

## 2020-01-02 NOTE — Therapy (Signed)
[facility] [address] [place], [place], [address] Phone: [phone]   Fax:  [phone]  Physical Therapy Treatment  Patient Details  Name: Larry Stone MRN: [id number] Date of Birth: [birth year] Referring Provider (PT): Dr. Lew Dawes   Encounter Date: 01/01/2020   PT End of Session - 01/02/20 1119    Visit Number 7    Number of Visits 13   eval to 12 visits   Authorization Type [facility] Medicare    Authorization Time Period 12-06-19 - 03-04-20    PT Start Time 0932    PT Stop Time 1016    PT Time Calculation (min) 44 min    Equipment Utilized During Treatment Gait belt    Activity Tolerance Patient tolerated treatment well    Behavior During Therapy [facility] for tasks assessed/performed           Past Medical History:  Diagnosis Date  . Anxiety   . CAD    a. acute inferior lateral wall infarction in September 2004 treated medically. b.  cath 06/17/16 showing mild nonobstructive CAD with 30-40% ostial LM (eccentric), elevated LV filling pressures and normal LV function.  . Chronic diastolic CHF (congestive heart failure) ([place])   . CKD (chronic kidney disease), stage II   . COPD (chronic obstructive pulmonary disease) ([place])   . Diabetes mellitus   . Diverticulosis   . DJD (degenerative joint disease)   . GERD (gastroesophageal reflux disease)   . HTN (hypertension)   . Hyperlipidemia   . Low back pain syndrome   . Myocardial infarction ([place]) 2004  . Obesity   . OSA (obstructive sleep apnea)   . RBBB   . Recurrent aspiration bronchitis/pneumonia   . Recurrent aspiration pneumonia ([place])    [name] 07/13/2016  . Thrombocytopenia ([place])   . Tubular adenoma of colon 2014    Past Surgical History:  Procedure Laterality Date  . CARDIAC CATHETERIZATION    . LEFT HEART CATH AND CORONARY ANGIOGRAPHY N/A 06/17/2016   Procedure: Left Heart Cath and Coronary Angiography;  Surgeon: Burnell Blanks, MD;   Location: [place] [facility];  Service: Cardiovascular;  Laterality: N/A;  . UVULOPALATOPHARYNGOPLASTY     surgery for OSA    There were no vitals filed for this visit.   Subjective Assessment - 01/01/20 0935    Subjective Pt reports his Lt shoulder is sore this morning - thinks that it is the new mattress he is sleeping on    Pertinent History s/p Lt  thalamic CVA:  Anxiety, essential HTN, DJD, CAD, COPD with chronic bronchitis, chronic diaastolic CHF, h/o MI in 5456: 06-17-16 L heart catheterization & coroncay angiography    Diagnostic tests CT and MRI both done.    Patient Stated Goals Improve balance and walking    Currently in Pain? Yes    Pain Score 7     Pain Location Shoulder    Pain Orientation Left    Pain Descriptors / Indicators Sore;Discomfort    Pain Type Acute pain    Pain Onset In the past 7 days    Pain Frequency Intermittent                          Pt amb. 40' x 1 rep with horizontal head turns with SBA; 40' x 1 rep with vertical head turns with SBA    [facility] Adult PT Treatment/Exercise - 01/02/20 0001      Ambulation/Gait  Ambulation/Gait Yes    Ambulation/Gait Assistance 5: Supervision    Ambulation/Gait Assistance Details cues to look straight ahead & not down at floor as much    Ambulation Distance (Feet) 230 Feet    Assistive device None    Gait Pattern Within Functional Limits    Ambulation Surface Level;Indoor      Exercises   Exercises Ankle      Ankle Exercises: Standing   Heel Raises Right;10 reps;1 second   with bil. UE support    Braiding (Round Trip) 2 reps along counter with UE support prn with CGA and cues for sequence          Pt performed quadruped ex. On mat - lifting opposite UE/LE 3 times each side with 5 sec hold attempted     Balance Exercises - 01/02/20 0001      Balance Exercises: Standing   Standing Eyes Closed Wide (BOA);Narrow base of support (BOS);Head turns;Foam/compliant surface;5 reps    Rockerboard  Anterior/posterior;Head turns;EO;EC;10 reps;Intermittent UE support    Partial Tandem Stance Eyes open;2 reps;20 secs   on floor   Other Standing Exercises Pt performed marching on 2 pillows in corner - EO 10 reps; EC 10 reps; then added head turns side to side EO with marching 10 reps and EC 10 reps with head turns horizontal and vertical     Other Standing Exercises Comments pt performed rolling ball forward and back for improved coordination RLE ; amb. on tip toes 40' x 2 reps for balance and Rt gastroc strengthening             PT Education - 01/02/20 1118    Education Details added braiding and RLE unilateral heel raise to HEP    Person(s) Educated Patient    Methods Explanation;Demonstration;Handout    Comprehension Verbalized understanding;Returned demonstration            PT Short Term Goals - 12/27/19 1404      PT SHORT TERM GOAL #1   Title Improve Berg balance test score from 46/56 to >/= 49/56 to reduce fall risk.    Baseline 12/27/19: 52/54 scored today    Time --   extended due to delay in start of treatment   Period --    Status Achieved    Target Date --      PT SHORT TERM GOAL #2   Title Pt will amb. 500' with no device on flat even surface with supervision.    Baseline 12/27/19: met in session today    Time --    Period --    Status Achieved    Target Date --      PT SHORT TERM GOAL #3   Title Increase gait velocity by at least .4 ft/sec for incr. gait efficiency.    Baseline 12/27/19: 3.45 ft/sec no AD    Time --    Period --    Status Achieved    Target Date --      PT SHORT TERM GOAL #4   Title Independent in HEP for balance and coordination exercises.    Baseline 12/27/19: met with current program, plus some things he does on his own    Status Achieved             PT Long Term Goals - 01/02/20 1122      PT LONG TERM GOAL #1   Title Improve Berg balance score to >/= 52/56 to reduce fall risk. (LTGs due 01/18/2020)  Baseline 46/56 on 12-06-19     Time 6    Period Weeks    Status New      PT LONG TERM GOAL #2   Title Pt will amb. 1000' on flat even and uneven surface without device modified independently with pt demonstrating ability to look ahead rather than down at feet.    Time 6    Status New      PT LONG TERM GOAL #3   Title Pt will report negotiating steps at home to 2nd level (rather than using chair lift) modified independently.    Time 6    Period Weeks    Status New      PT LONG TERM GOAL #4   Title Independent in updated HEP for balance and coordination exercises.    Time 6    Period Weeks    Status New      PT LONG TERM GOAL #5   Title Improve FGA score by at least 5 points to increase safety with gait and to reduce fall risk.    Baseline FGA on 12/11/19 14/30    Time 6    Period Weeks    Status New                 Plan - 01/01/20 0936    Clinical Impression Statement Pt is progressing well towards LTG's - continues to have some difficulty with maintaining balance on compliant surface with EC, indicative of decreased vestibular input in maintaining balance.  Coordination is improving in RLE with less ataxic movement noted in Rt foot in swing phase of gait.    Personal Factors and Comorbidities Behavior Pattern;Comorbidity 2    Comorbidities anxiety, HTN, DJD, CAD, COPD with chronic bronchitis, chronic diastolic CHF, 05-20-28 lt heart cath. & coronary angiography, s/p MI 2004    Examination-Activity Limitations [place];Stairs;Squat;Locomotion Level;Transfers    Examination-Participation Restrictions Cleaning;Shop;Yard International aid/development worker work activities   Stability/Clinical Decision Making Evolving/Moderate complexity    Rehab Potential Good    PT Frequency 2x / week    PT Duration 6 weeks    PT Treatment/Interventions ADLs/Self Care Home Management;DME Instruction;Gait training;Stair training;Therapeutic activities;Therapeutic exercise;Balance training;Neuromuscular  re-education;Patient/family education    PT Next Visit Plan high level dynamic balance exercises and coordination ex. RLE:  gait training without device    PT Home Exercise Plan coordination ex. RLE in standing (12-06-19)    Consulted and Agree with Plan of Care Patient;Family member/caregiver    Family Member Consulted daughter           Patient will benefit from skilled therapeutic intervention in order to improve the following deficits and impairments:  Abnormal gait, Decreased activity tolerance, Decreased balance, Decreased coordination, Decreased strength, Impaired sensation  Visit Diagnosis: Unsteadiness on feet  Decreased coordination  Other abnormalities of gait and mobility     Problem List Patient Active Problem List   Diagnosis Date Noted  . Allergic rhinitis 12/26/2019  . Cerebrovascular accident (CVA) of left thalamus ([place]) 12/05/2019  . Right hemiparesis ([place]) 12/04/2019  . Chronic renal insufficiency, stage 3 (moderate) 07/05/2019  . Mild cognitive disorder 10/12/2017  . Viral pneumonitis 07/22/2016  . COPD with asthma ([place]) 07/22/2016  . Depression with anxiety 07/13/2016  . Acute respiratory failure with hypoxia ([place]) 07/13/2016  . Chronic diastolic CHF (congestive heart failure) ([place]) 07/13/2016  . Sepsis, unspecified organism ([place]) 07/13/2016  . Acute pain of right knee 06/30/2016  . Fatigue 06/30/2016  . Creatinine elevation 06/30/2016  .  Dyspnea on exertion   . COPD with acute exacerbation ([place]) 04/04/2015  . Elevated IgE level 03/18/2014  . Well adult exam 03/05/2014  . COPD with chronic bronchitis ([place]) 12/13/2013  . Venous insufficiency 11/03/2011  . Edema 11/03/2011  . Memory disturbance 05/13/2011  . CAD (coronary artery disease) 09/17/2010  . 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 Incidental pulmonary nodule 06/17/2009  . COLONIC POLYPS 10/17/2007  . BRONCHITIS, RECURRENT 10/17/2007  . GERD (gastroesophageal reflux disease) 10/17/2007  .  Diverticulosis of large intestine 10/17/2007  . DEGENERATIVE JOINT DISEASE 10/17/2007  . Hyperglycemia 10/17/2007  . Dyslipidemia 10/02/2007  . Anxiety state 10/02/2007  . Obstructive sleep apnea 10/02/2007  . Essential hypertension 10/02/2007  . LOW BACK PAIN SYNDROME 10/02/2007    Alda Lea, PT 01/02/2020, 11:24 AM  [facility] [address] [place], [place], [address] Phone: [phone]   Fax:  [phone]  Name: Larry Stone MRN: [id number] Date of Birth: 02/27/1951

## 2020-01-03 ENCOUNTER — Ambulatory Visit: Payer: Medicare Other | Admitting: Occupational Therapy

## 2020-01-03 ENCOUNTER — Other Ambulatory Visit: Payer: Self-pay

## 2020-01-03 ENCOUNTER — Ambulatory Visit: Payer: Medicare Other | Admitting: Physical Therapy

## 2020-01-03 ENCOUNTER — Encounter: Payer: Self-pay | Admitting: Occupational Therapy

## 2020-01-03 DIAGNOSIS — R278 Other lack of coordination: Secondary | ICD-10-CM

## 2020-01-03 DIAGNOSIS — R2681 Unsteadiness on feet: Secondary | ICD-10-CM

## 2020-01-03 DIAGNOSIS — R2689 Other abnormalities of gait and mobility: Secondary | ICD-10-CM

## 2020-01-03 DIAGNOSIS — R208 Other disturbances of skin sensation: Secondary | ICD-10-CM

## 2020-01-03 DIAGNOSIS — M6281 Muscle weakness (generalized): Secondary | ICD-10-CM

## 2020-01-03 DIAGNOSIS — I69351 Hemiplegia and hemiparesis following cerebral infarction affecting right dominant side: Secondary | ICD-10-CM

## 2020-01-03 NOTE — Therapy (Signed)
[facility] [address] [place], [place], [address] Phone: [phone]   Fax:  [phone]  Occupational Therapy Treatment  Patient Details  Name: Larry Stone MRN: [id number] Date of Birth: 1951/01/03 Referring Provider (OT): Dr Lew Dawes   Encounter Date: 01/03/2020   OT End of Session - 01/03/20 1547    Visit Number 6    Number of Visits 17    Date for OT Re-Evaluation 03/10/20    Authorization Type [employer] Medicare  VL:MN    Progress Note Due on Visit 10    OT Start Time 1400    OT Stop Time 1445    OT Time Calculation (min) 45 min    Activity Tolerance Patient tolerated treatment well    Behavior During Therapy [facility] for tasks assessed/performed           Past Medical History:  Diagnosis Date  . Anxiety   . CAD    a. acute inferior lateral wall infarction in September 2004 treated medically. b.  cath 06/17/16 showing mild nonobstructive CAD with 30-40% ostial LM (eccentric), elevated LV filling pressures and normal LV function.  . Chronic diastolic CHF (congestive heart failure) ([place])   . CKD (chronic kidney disease), stage II   . COPD (chronic obstructive pulmonary disease) ([place])   . Diabetes mellitus   . Diverticulosis   . DJD (degenerative joint disease)   . GERD (gastroesophageal reflux disease)   . HTN (hypertension)   . Hyperlipidemia   . Low back pain syndrome   . Myocardial infarction ([place]) 2004  . Obesity   . OSA (obstructive sleep apnea)   . RBBB   . Recurrent aspiration bronchitis/pneumonia   . Recurrent aspiration pneumonia ([place])    [name] 07/13/2016  . Thrombocytopenia ([place])   . Tubular adenoma of colon 2014    Past Surgical History:  Procedure Laterality Date  . CARDIAC CATHETERIZATION    . LEFT HEART CATH AND CORONARY ANGIOGRAPHY N/A 06/17/2016   Procedure: Left Heart Cath and Coronary Angiography;  Surgeon: Burnell Blanks, MD;  Location: [facility];  Service:  Cardiovascular;  Laterality: N/A;  . UVULOPALATOPHARYNGOPLASTY     surgery for OSA    There were no vitals filed for this visit.   Subjective Assessment - 01/03/20 1407    Subjective  I did not sleep last night, so I feel a little unsteady today    Currently in Pain? No/denies    Pain Score 0-No pain                        OT Treatments/Exercises (OP) - 01/03/20 0001      ADLs   Driving Patient purchased a new truck over the weekend, and is working to learn and program some of its features.  Patient has not yet attempted driving in parking lot.        Neurological Re-education Exercises   Other Exercises 1 Neuromuscular reeducation to address fine motor skills as well as eye hand coordiantion.  Fitting shapes into perfection game, seated at table.  Patient able to reduce time from 64min 38 sec to 1 min 33 sec.  Worked in standing on building block tower overhead on narrow ledge - to address controlled placement and more extended periods of time with arm overhead.  Patient initially having difficulty controlling arm and hand, and knocking blocks off surface.  Cued patient to move slowly and deliberately, and improved perfomance noted.  Increased challenge by having him build higher tower, and stepping up onto 3inch block.                      OT Short Term Goals - 12/25/19 1800      OT SHORT TERM GOAL #1   Title Patient will complete a home exercise program designed to improve coordiantion in right hand independently 01/25/20    Time 4    Period Weeks    Status Achieved    Target Date 01/25/20      OT SHORT TERM GOAL #2   Title Patient will demonstrate effective use of fork or spoon with right UE reporting decreased spilling/dropping    Time 4    Period Weeks    Status Achieved      OT SHORT TERM GOAL #3   Title Patient will report less dropping when picking up small objects with RUE    Time 4    Period Weeks    Status Achieved             OT  Long Term Goals - 12/27/19 1344      OT LONG TERM GOAL #1   Period Weeks    Status On-going      OT LONG TERM GOAL #2   Title Patient will demonstrate awareness of driving recommendations    Time 8    Period Weeks    Status Achieved      OT LONG TERM GOAL #3   Title Patient will demonstrate effective use of hand tools - e.g. screwdriver, wrench, hammer with RUE as dominant    Time 8    Period Weeks    Status On-going      OT LONG TERM GOAL #4   Title Patient will demonstrate improved coordination in RUE as evidenced by 5 sec reduction in time on 9 hole peg test    Baseline 41.32    Time 8    Status Achieved                 Plan - 01/03/20 1547    Clinical Impression Statement Patient reports feeling "off" after not sleeping last night, however, BP stable, and no functional difference detected.    OT Occupational Profile and History Problem Focused Assessment - Including review of records relating to presenting problem    Occupational performance deficits (Please refer to evaluation for details): ADL's;IADL's;Leisure    Body Structure / Function / Physical Skills ADL;Coordination;Endurance    Rehab Potential Excellent    Clinical Decision Making Limited treatment options, no task modification necessary    Comorbidities Affecting Occupational Performance: May have comorbidities impacting occupational performance    Modification or Assistance to Complete Evaluation  No modification of tasks or assist necessary to complete eval    OT Frequency 2x / week    OT Duration 8 weeks    OT Treatment/Interventions Self-care/ADL training;Therapeutic exercise;Patient/family education;Splinting;Neuromuscular education;Therapeutic activities;Balance training;Manual Therapy;DME and/or AE instruction    Plan Higher level coordination training - fine and gross motor    OT Home Exercise Plan coordination    Consulted and Agree with Plan of Care Patient           Patient will benefit  from skilled therapeutic intervention in order to improve the following deficits and impairments:   Body Structure / Function / Physical Skills: ADL, Coordination, Endurance       Visit Diagnosis: Hemiplegia and hemiparesis following cerebral infarction affecting right dominant  side (HCC)  Unsteadiness on feet  Decreased coordination  Other disturbances of skin sensation  Muscle weakness (generalized)    Problem List Patient Active Problem List   Diagnosis Date Noted  . Allergic rhinitis 12/26/2019  . Cerebrovascular accident (CVA) of left thalamus ([place]) 12/05/2019  . Right hemiparesis ([place]) 12/04/2019  . Chronic renal insufficiency, stage 3 (moderate) 07/05/2019  . Mild cognitive disorder 10/12/2017  . Viral pneumonitis 07/22/2016  . COPD with asthma ([place]) 07/22/2016  . Depression with anxiety 07/13/2016  . Acute respiratory failure with hypoxia ([place]) 07/13/2016  . Chronic diastolic CHF (congestive heart failure) ([place]) 07/13/2016  . Sepsis, unspecified organism ([place]) 07/13/2016  . Acute pain of right knee 06/30/2016  . Fatigue 06/30/2016  . Creatinine elevation 06/30/2016  . Dyspnea on exertion   . COPD with acute exacerbation ([place]) 04/04/2015  . Elevated IgE level 03/18/2014  . Well adult exam 03/05/2014  . COPD with chronic bronchitis ([place]) 12/13/2013  . Venous insufficiency 11/03/2011  . Edema 11/03/2011  . Memory disturbance 05/13/2011  . CAD (coronary artery disease) 09/17/2010  . 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 Incidental pulmonary nodule 06/17/2009  . COLONIC POLYPS 10/17/2007  . BRONCHITIS, RECURRENT 10/17/2007  . GERD (gastroesophageal reflux disease) 10/17/2007  . Diverticulosis of large intestine 10/17/2007  . DEGENERATIVE JOINT DISEASE 10/17/2007  . Hyperglycemia 10/17/2007  . Dyslipidemia 10/02/2007  . Anxiety state 10/02/2007  . Obstructive sleep apnea 10/02/2007  . Essential hypertension 10/02/2007  . LOW BACK PAIN SYNDROME 10/02/2007     Mariah Milling, OTR/L 01/03/2020, 3:50 PM  [facility] [address] [place], [place], [address] Phone: [phone]   Fax:  [phone]  Name: Larry Stone MRN: [id number] Date of Birth: October 13, 1950

## 2020-01-04 NOTE — Therapy (Signed)
[facility] [address] [place], [place], [address] Phone: [phone]   Fax:  [phone]  Physical Therapy Treatment  Patient Details  Name: Larry Stone MRN: [id number] Date of Birth: [birth year] Referring Provider (PT): Dr. Lew Dawes   Encounter Date: 01/03/2020   PT End of Session - 01/04/20 1315    Visit Number 8    Number of Visits 13   eval to 12 visits   Authorization Type [facility] Medicare    Authorization Time Period 12-06-19 - 03-04-20    PT Start Time 1535    PT Stop Time 1615    PT Time Calculation (min) 40 min    Equipment Utilized During Treatment Gait belt    Activity Tolerance Patient tolerated treatment well    Behavior During Therapy [facility] for tasks assessed/performed           Past Medical History:  Diagnosis Date  . Anxiety   . CAD    a. acute inferior lateral wall infarction in September 2004 treated medically. b.  cath 06/17/16 showing mild nonobstructive CAD with 30-40% ostial LM (eccentric), elevated LV filling pressures and normal LV function.  . Chronic diastolic CHF (congestive heart failure) ([place])   . CKD (chronic kidney disease), stage II   . COPD (chronic obstructive pulmonary disease) ([place])   . Diabetes mellitus   . Diverticulosis   . DJD (degenerative joint disease)   . GERD (gastroesophageal reflux disease)   . HTN (hypertension)   . Hyperlipidemia   . Low back pain syndrome   . Myocardial infarction ([place]) 2004  . Obesity   . OSA (obstructive sleep apnea)   . RBBB   . Recurrent aspiration bronchitis/pneumonia   . Recurrent aspiration pneumonia ([place])    [name] 07/13/2016  . Thrombocytopenia ([place])   . Tubular adenoma of colon 2014    Past Surgical History:  Procedure Laterality Date  . CARDIAC CATHETERIZATION    . LEFT HEART CATH AND CORONARY ANGIOGRAPHY N/A 06/17/2016   Procedure: Left Heart Cath and Coronary Angiography;  Surgeon: Burnell Blanks, MD;   Location: [facility];  Service: Cardiovascular;  Laterality: N/A;  . UVULOPALATOPHARYNGOPLASTY     surgery for OSA    There were no vitals filed for this visit.   Subjective Assessment - 01/03/20 1536    Subjective Pt states he feels a little bit off today - could not sleep last night; pt states he feels he is walking better and Rt foot is more stable (not as much movement)    Pertinent History s/p Lt  thalamic CVA:  Anxiety, essential HTN, DJD, CAD, COPD with chronic bronchitis, chronic diaastolic CHF, h/o MI in 3329: 06-17-16 L heart catheterization & coroncay angiography    Diagnostic tests CT and MRI both done.    Patient Stated Goals Improve balance and walking    Currently in Pain? No/denies    Pain Onset In the past 7 days                             [facility] Adult PT Treatment/Exercise - 01/04/20 0001      High Level Balance   High Level Balance Activities Braiding;Marching forwards;Marching backwards   inside // bars with UE support prn     Therapeutic Activites    Therapeutic Activities Other Therapeutic Activities    Other Therapeutic Activities Pt performed jumping (small range) on floor inside // bars  5 reps without UE support; 1/2 jumping jacks and then lunges 5 reps without UE support on // bars       Neuro Re-ed    Neuro Re-ed Details  Pt performed coordination activities for RLE - used 4 colored discs - named 3 colors and pt touched each disc in order called with Rt foot for improved motor planning and control, coordination and SLS; used 3 cones - pt performed cone taps to all 3 cones without foot touching floor, then tipping cone over and standing upright with Rt foot without UE support                Balance Exercises - 01/04/20 0001      Balance Exercises: Standing   Standing Eyes Closed Head turns;Solid surface;5 reps    Rockerboard Anterior/posterior;Head turns;EO;EC;10 reps;Intermittent UE support    Partial Tandem Stance Eyes  open;2 reps;15 secs   with UE support prn   Other Standing Exercises 2# weight placed on RLE for incr. proprioceptive input - pt performed braiding inside // bars 10' x 4 reps (attempted to achieve less rubbing of Rt knee on LLE when bringing RLE from behind the LLE:  stepping over and back of balance beam with RLE without UE support                                         PT Short Term Goals - 01/04/20 1317      PT SHORT TERM GOAL #1   Title Improve Berg balance test score from 46/56 to >/= 49/56 to reduce fall risk.    Baseline 12/27/19: 52/54 scored today    Time --   extended due to delay in start of treatment   Status Achieved      PT SHORT TERM GOAL #2   Title Pt will amb. 500' with no device on flat even surface with supervision.    Baseline 12/27/19: met in session today    Status Achieved      PT SHORT TERM GOAL #3   Title Increase gait velocity by at least .4 ft/sec for incr. gait efficiency.    Baseline 12/27/19: 3.45 ft/sec no AD    Status Achieved      PT SHORT TERM GOAL #4   Title Independent in HEP for balance and coordination exercises.    Baseline 12/27/19: met with current program, plus some things he does on his own    Status Achieved             PT Long Term Goals - 01/04/20 1317      PT LONG TERM GOAL #1   Title Improve Berg balance score to >/= 52/56 to reduce fall risk. (LTGs due 01/18/2020)    Baseline 46/56 on 12-06-19    Time 6    Period Weeks    Status New      PT LONG TERM GOAL #2   Title Pt will amb. 1000' on flat even and uneven surface without device modified independently with pt demonstrating ability to look ahead rather than down at feet.    Time 6    Status New      PT LONG TERM GOAL #3   Title Pt will report negotiating steps at home to 2nd level (rather than using chair lift) modified independently.    Time 6    Period Weeks    Status  New      PT LONG TERM GOAL #4   Title Independent in updated HEP for balance and coordination  exercises.    Time 6    Period Weeks    Status New      PT LONG TERM GOAL #5   Title Improve FGA score by at least 5 points to increase safety with gait and to reduce fall risk.    Baseline FGA on 12/11/19 14/30    Time 6    Period Weeks    Status New                 Plan - 01/04/20 1316    Clinical Impression Statement Pt is progressing well towards goals with less ataxia and extraneous movement noted in Rt foot & ankle in swing thru phase of gait.  Pt fatigued very quickly with plometric activities, indicative of decreased cardiovascular conditioning, but able to recover quickly with short seated rest breaks.  SLS on RLE also noted to be improving.  Cont toward POC.    Personal Factors and Comorbidities Behavior Pattern;Comorbidity 2    Comorbidities anxiety, HTN, DJD, CAD, COPD with chronic bronchitis, chronic diastolic CHF, 07-21-30 lt heart cath. & coronary angiography, s/p MI 2004    Examination-Activity Limitations Bend;Stairs;Squat;Locomotion Level;Transfers    Examination-Participation Restrictions Cleaning;Shop;[employer]/development worker work activities   Stability/Clinical Decision Making Evolving/Moderate complexity    Rehab Potential Good    PT Frequency 2x / week    PT Duration 6 weeks    PT Treatment/Interventions ADLs/Self Care Home Management;DME Instruction;Gait training;Stair training;Therapeutic activities;Therapeutic exercise;Balance training;Neuromuscular re-education;Patient/family education    PT Next Visit Plan high level dynamic balance and gait exercises and coordination ex. RLE; D/C planned in 2 weeks    PT Home Exercise Plan coordination ex. RLE in standing (12-06-19)    Consulted and Agree with Plan of Care Patient;Family member/caregiver    Family Member Consulted daughter           Patient will benefit from skilled therapeutic intervention in order to improve the following deficits and impairments:  Abnormal gait, Decreased  activity tolerance, Decreased balance, Decreased coordination, Decreased strength, Impaired sensation  Visit Diagnosis: Unsteadiness on feet  Decreased coordination  Other abnormalities of gait and mobility     Problem List Patient Active Problem List   Diagnosis Date Noted  . Allergic rhinitis 12/26/2019  . Cerebrovascular accident (CVA) of left thalamus ([place]) 12/05/2019  . Right hemiparesis ([place]) 12/04/2019  . Chronic renal insufficiency, stage 3 (moderate) 07/05/2019  . Mild cognitive disorder 10/12/2017  . Viral pneumonitis 07/22/2016  . COPD with asthma ([place]) 07/22/2016  . Depression with anxiety 07/13/2016  . Acute respiratory failure with hypoxia ([place]) 07/13/2016  . Chronic diastolic CHF (congestive heart failure) ([place]) 07/13/2016  . Sepsis, unspecified organism ([place]) 07/13/2016  . Acute pain of right knee 06/30/2016  . Fatigue 06/30/2016  . Creatinine elevation 06/30/2016  . Dyspnea on exertion   . COPD with acute exacerbation ([place]) 04/04/2015  . Elevated IgE level 03/18/2014  . Well adult exam 03/05/2014  . COPD with chronic bronchitis ([place]) 12/13/2013  . Venous insufficiency 11/03/2011  . Edema 11/03/2011  . Memory disturbance 05/13/2011  . CAD (coronary artery disease) 09/17/2010  . 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 Incidental pulmonary nodule 06/17/2009  . COLONIC POLYPS 10/17/2007  . BRONCHITIS, RECURRENT 10/17/2007  . GERD (gastroesophageal reflux disease) 10/17/2007  . Diverticulosis of large intestine 10/17/2007  . DEGENERATIVE JOINT DISEASE 10/17/2007  .  Hyperglycemia 10/17/2007  . Dyslipidemia 10/02/2007  . Anxiety state 10/02/2007  . Obstructive sleep apnea 10/02/2007  . Essential hypertension 10/02/2007  . LOW BACK PAIN SYNDROME 10/02/2007    Alda Lea, PT 01/04/2020, 1:23 PM  [facility] [address] [place], [place], [address] Phone: [phone]   Fax:   [phone]  Name: Larry Stone MRN: [id number] Date of Birth: Jan 04, 1951

## 2020-01-08 ENCOUNTER — Encounter: Payer: Self-pay | Admitting: Occupational Therapy

## 2020-01-08 ENCOUNTER — Other Ambulatory Visit: Payer: Self-pay

## 2020-01-08 ENCOUNTER — Ambulatory Visit: Payer: Medicare Other

## 2020-01-08 ENCOUNTER — Ambulatory Visit: Payer: Medicare Other | Admitting: Occupational Therapy

## 2020-01-08 DIAGNOSIS — R2681 Unsteadiness on feet: Secondary | ICD-10-CM

## 2020-01-08 DIAGNOSIS — M6281 Muscle weakness (generalized): Secondary | ICD-10-CM

## 2020-01-08 DIAGNOSIS — R278 Other lack of coordination: Secondary | ICD-10-CM

## 2020-01-08 DIAGNOSIS — I69351 Hemiplegia and hemiparesis following cerebral infarction affecting right dominant side: Secondary | ICD-10-CM

## 2020-01-08 DIAGNOSIS — R2689 Other abnormalities of gait and mobility: Secondary | ICD-10-CM

## 2020-01-08 DIAGNOSIS — R208 Other disturbances of skin sensation: Secondary | ICD-10-CM

## 2020-01-08 NOTE — Therapy (Signed)
[facility] [address] [place], [place], [address] Phone: [phone]   Fax:  [phone]  Occupational Therapy Treatment  Patient Details  Name: Larry Stone MRN: [id number] Date of Birth: 11-26-1950 Referring Provider (OT): Dr Lew Dawes   Encounter Date: 01/08/2020   OT End of Session - 01/08/20 1723    Visit Number 7    Number of Visits 17    Date for OT Re-Evaluation 03/10/20    Authorization Type [employer] Medicare  VL:MN    Progress Note Due on Visit 10    OT Start Time 1700    OT Stop Time 1720    OT Time Calculation (min) 20 min    Activity Tolerance Patient tolerated treatment well    Behavior During Therapy [facility] for tasks assessed/performed           Past Medical History:  Diagnosis Date   Anxiety    CAD    a. acute inferior lateral wall infarction in September 2004 treated medically. b.  cath 06/17/16 showing mild nonobstructive CAD with 30-40% ostial LM (eccentric), elevated LV filling pressures and normal LV function.   Chronic diastolic CHF (congestive heart failure) (HCC)    CKD (chronic kidney disease), stage II    COPD (chronic obstructive pulmonary disease) (HCC)    Diabetes mellitus    Diverticulosis    DJD (degenerative joint disease)    GERD (gastroesophageal reflux disease)    HTN (hypertension)    Hyperlipidemia    Low back pain syndrome    Myocardial infarction ([place]) 2004   Obesity    OSA (obstructive sleep apnea)    RBBB    Recurrent aspiration bronchitis/pneumonia    Recurrent aspiration pneumonia ([place])    [name] 07/13/2016   Thrombocytopenia (HCC)    Tubular adenoma of colon 2014    Past Surgical History:  Procedure Laterality Date   CARDIAC CATHETERIZATION     LEFT HEART CATH AND CORONARY ANGIOGRAPHY N/A 06/17/2016   Procedure: Left Heart Cath and Coronary Angiography;  Surgeon: Burnell Blanks, MD;  Location: [facility];  Service:  Cardiovascular;  Laterality: N/A;   UVULOPALATOPHARYNGOPLASTY     surgery for OSA    There were no vitals filed for this visit.   Subjective Assessment - 01/08/20 1705    Subjective  I maxed out PT!    Currently in Pain? 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  He has experienced no difficulties, and is hoping to return to driving.  Patient has no residual cooridnation, visual, or cognitive deficits which would impact driving. Patient has met all OT goals and is agreeable to discharge at ths time.                    OT Education - 01/08/20 1722    Education Details goal achievement    Person(s) Educated Patient    Methods Explanation    Comprehension Verbalized understanding            OT Short Term Goals - 01/08/20 1711      OT SHORT TERM GOAL #1   Title Patient will complete a home  exercise program designed to improve coordiantion in right hand independently 01/25/20    Status Achieved      OT SHORT TERM GOAL #2   Title Patient will demonstrate effective use of fork or spoon with right UE reporting decreased spilling/dropping    Status Achieved      OT SHORT TERM GOAL #3   Title Patient will report less dropping when picking up small objects with RUE    Status Achieved             OT Long Term Goals - 01/08/20 1710      OT LONG TERM GOAL #1   Title Patient will complete an updated HEP independently    Time 8    Period Weeks    Status Achieved      OT LONG TERM GOAL #2   Title Patient will demonstrate awareness of driving recommendations    Time 8    Period Weeks    Status Achieved      OT LONG TERM GOAL #3   Title Patient will demonstrate effective use of hand tools - e.g. screwdriver, wrench, hammer with RUE as dominant    Time 8    Period Weeks     Status Achieved      OT LONG TERM GOAL #4   Title Patient will demonstrate improved coordination in RUE as evidenced by 5 sec reduction in time on 9 hole peg test    Baseline 41.32    Period Weeks    Status Achieved                 Plan - 01/08/20 1723    Clinical Impression Statement Patient has met and exceeded all OT goals and is agreeable to discharge at this time    OT Occupational Profile and History Problem Focused Assessment - Including review of records relating to presenting problem    Occupational performance deficits (Please refer to evaluation for details): ADL's;IADL's;Leisure    Body Structure / Function / Physical Skills ADL;Coordination;Endurance    Rehab Potential Excellent    Clinical Decision Making Limited treatment options, no task modification necessary    Comorbidities Affecting Occupational Performance: May have comorbidities impacting occupational performance    Modification or Assistance to Complete Evaluation  No modification of tasks or assist necessary to complete eval    OT Frequency 2x / week    OT Duration 8 weeks    OT Treatment/Interventions Self-care/ADL training;Therapeutic exercise;Patient/family education;Splinting;Neuromuscular education;Therapeutic activities;Balance training;Manual Therapy;DME and/or AE instruction    Plan discharge    OT Home Exercise Plan coordination    Consulted and Agree with Plan of Care Patient           Patient will benefit from skilled therapeutic intervention in order to improve the following deficits and impairments:   Body Structure / Function / Physical Skills: ADL, Coordination, Endurance       Visit Diagnosis: Unsteadiness on feet  Hemiplegia and hemiparesis following cerebral infarction affecting right dominant side (HCC)  Decreased coordination  Other disturbances of skin sensation  Muscle weakness (generalized)    Problem List Patient Active Problem List   Diagnosis Date Noted    Allergic rhinitis 12/26/2019   Cerebrovascular accident (CVA) of left thalamus ([place]) 12/05/2019   Right hemiparesis ([place]) 12/04/2019   Chronic renal insufficiency, stage 3 (moderate) 07/05/2019   Mild cognitive disorder 10/12/2017   Viral pneumonitis 07/22/2016   COPD with asthma ([place]) 07/22/2016   Depression with anxiety 07/13/2016  Acute respiratory failure with hypoxia (HCC) 07/13/2016   Chronic diastolic CHF (congestive heart failure) ([place]) 07/13/2016   Sepsis, unspecified organism ([place]) 07/13/2016   Acute pain of right knee 06/30/2016   Fatigue 06/30/2016   Creatinine elevation 06/30/2016   Dyspnea on exertion    COPD with acute exacerbation ([place]) 04/04/2015   Elevated IgE level 03/18/2014   Well adult exam 03/05/2014   COPD with chronic bronchitis ([place]) 12/13/2013   Venous insufficiency 11/03/2011   Edema 11/03/2011   Memory disturbance 05/13/2011   CAD (coronary artery disease) 09/17/2010   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   Incidental pulmonary nodule 06/17/2009   COLONIC POLYPS 10/17/2007   BRONCHITIS, RECURRENT 10/17/2007   GERD (gastroesophageal reflux disease) 10/17/2007   Diverticulosis of large intestine 10/17/2007   DEGENERATIVE JOINT DISEASE 10/17/2007   Hyperglycemia 10/17/2007   Dyslipidemia 10/02/2007   Anxiety state 10/02/2007   Obstructive sleep apnea 10/02/2007   Essential hypertension 10/02/2007   LOW BACK PAIN SYNDROME 10/02/2007   OCCUPATIONAL THERAPY DISCHARGE SUMMARY  Visits from Start of Care: 7  Current functional level related to goals / functional outcomes: Improved functional use of RUE, improved functional mobility, improved activity tolerance   Remaining deficits: Paresthesia R  Hand and face   Education / Equipment: Driving - graduated program, coordination exercises Plan: Patient agrees to discharge.  Patient goals were met. Patient is being discharged due to meeting the stated rehab goals.   ?????     Mariah Milling, OTR/L 01/08/2020, 5:25 PM  [facility] [address] [place], [place], [address] Phone: [phone]   Fax:  [phone]  Name: LOCHLANN MASTRANGELO MRN: [id number] Date of Birth: [birth year]

## 2020-01-08 NOTE — Therapy (Signed)
[facility] [address] [place], [place], [address] Phone: [phone]   Fax:  [phone]  Physical Therapy Treatment  Patient Details  Name: Larry Stone MRN: [id number] Date of Birth: 1950-11-23 Referring Provider (PT): Dr. Lew Dawes   Encounter Date: 01/08/2020   PT End of Session - 01/08/20 1616    Visit Number 9    Number of Visits 13   eval to 12 visits   Authorization Type [facility] Medicare    Authorization Time Period 12-06-19 - 03-04-20    PT Start Time 1615    PT Stop Time 1700    PT Time Calculation (min) 45 min    Equipment Utilized During Treatment Gait belt    Activity Tolerance Patient tolerated treatment well    Behavior During Therapy [facility] for tasks assessed/performed           Past Medical History:  Diagnosis Date  . Anxiety   . CAD    a. acute inferior lateral wall infarction in September 2004 treated medically. b.  cath 06/17/16 showing mild nonobstructive CAD with 30-40% ostial LM (eccentric), elevated LV filling pressures and normal LV function.  . Chronic diastolic CHF (congestive heart failure) ([place])   . CKD (chronic kidney disease), stage II   . COPD (chronic obstructive pulmonary disease) ([place])   . Diabetes mellitus   . Diverticulosis   . DJD (degenerative joint disease)   . GERD (gastroesophageal reflux disease)   . HTN (hypertension)   . Hyperlipidemia   . Low back pain syndrome   . Myocardial infarction ([place]) 2004  . Obesity   . OSA (obstructive sleep apnea)   . RBBB   . Recurrent aspiration bronchitis/pneumonia   . Recurrent aspiration pneumonia ([place])    [name] 07/13/2016  . Thrombocytopenia ([place])   . Tubular adenoma of colon 2014    Past Surgical History:  Procedure Laterality Date  . CARDIAC CATHETERIZATION    . LEFT HEART CATH AND CORONARY ANGIOGRAPHY N/A 06/17/2016   Procedure: Left Heart Cath and Coronary Angiography;  Surgeon: Burnell Blanks, MD;   Location: [facility];  Service: Cardiovascular;  Laterality: N/A;  . UVULOPALATOPHARYNGOPLASTY     surgery for OSA    There were no vitals filed for this visit.   Subjective Assessment - 01/08/20 1616    Subjective Pt reports that foot is doing much better. He still finds that his walking is not quite normal at times. Would like to have it feel normal all the time. Going up and down steps with reciprocal pattern.    Pertinent History s/p Lt  thalamic CVA:  Anxiety, essential HTN, DJD, CAD, COPD with chronic bronchitis, chronic diaastolic CHF, h/o MI in 4315: 06-17-16 L heart catheterization & coroncay angiography    Diagnostic tests CT and MRI both done.    Patient Stated Goals Improve balance and walking    Currently in Pain? No/denies    Pain Onset In the past 7 days                             [facility] Adult PT Treatment/Exercise - 01/08/20 1619      Ambulation/Gait   Ambulation/Gait Yes    Ambulation/Gait Assistance 5: Supervision    Ambulation Distance (Feet) 345 Feet    Assistive device None    Gait Pattern Step-through pattern    Stairs Yes    Stairs Assistance 6: Modified independent (  Device/Increase time)    Stair Management Technique Alternating pattern;One rail Left    Number of Stairs 16    Gait Comments Gait over treadmill x 5 min at 3.59mh without UE support. HR=66 and BP=140/78 after. Pt was cued to take big steps throughout with good right foot placement and arm swing.      Standardized Balance Assessment   Standardized Balance Assessment Berg Balance Test      Berg Balance Test   Sit to Stand Able to stand without using hands and stabilize independently    Standing Unsupported Able to stand safely 2 minutes    Sitting with Back Unsupported but Feet Supported on Floor or Stool Able to sit safely and securely 2 minutes    Stand to Sit Sits safely with minimal use of hands    Transfers Able to transfer safely, minor use of hands    Standing  Unsupported with Eyes Closed Able to stand 10 seconds safely    Standing Ubsupported with Feet Together Able to place feet together independently and stand 1 minute safely    From Standing, Reach Forward with Outstretched Arm Can reach confidently >25 cm (10")    From Standing Position, Pick up Object from Floor Able to pick up shoe safely and easily    From Standing Position, Turn to Look Behind Over each Shoulder Looks behind from both sides and weight shifts well    Turn 360 Degrees Able to turn 360 degrees safely in 4 seconds or less    Standing Unsupported, Alternately Place Feet on Step/Stool Able to stand independently and safely and complete 8 steps in 20 seconds    Standing Unsupported, One Foot in Front Able to place foot tandem independently and hold 30 seconds    Standing on One Leg Able to lift leg independently and hold > 10 seconds    Total Score 56      Neuro Re-ed    Neuro Re-ed Details  Dynamic gait activities in hallway: head turns left/right 40' x 2, up/down 40' x 2, tandem gait 40' x 2 CGA, marching gait 40' x 2, tossing 1.1# med ball hand to hand while walking 40' x 2, holding ball in front and moving side to side folllowing with eyes x 40' horizontal and x 40' vertical. Over floor ladder: marching steps x 2 bouts, bunny hops x 2, walking on toes x 2 then walking on heels x 2. CGA with ladder activities. Pt was most challenged with hopping both with coordination and increased SOB. Reports that he was had long issues of SOB with those type activities. Needed CGA for safety.                  PT Education - 01/08/20 1716    Education Details Discussed possible d/c next week pending no more issues develop. Discussed results of Berg testing.    Person(s) Educated Patient    Methods Explanation    Comprehension Verbalized understanding            PT Short Term Goals - 01/04/20 1317      PT SHORT TERM GOAL #1   Title Improve Berg balance test score from 46/56 to  >/= 49/56 to reduce fall risk.    Baseline 12/27/19: 52/54 scored today    Time --   extended due to delay in start of treatment   Status Achieved      PT SHORT TERM GOAL #2   Title Pt will amb. 500'  with no device on flat even surface with supervision.    Baseline 12/27/19: met in session today    Status Achieved      PT SHORT TERM GOAL #3   Title Increase gait velocity by at least .4 ft/sec for incr. gait efficiency.    Baseline 12/27/19: 3.45 ft/sec no AD    Status Achieved      PT SHORT TERM GOAL #4   Title Independent in HEP for balance and coordination exercises.    Baseline 12/27/19: met with current program, plus some things he does on his own    Status Achieved             PT Long Term Goals - 01/08/20 1639      PT LONG TERM GOAL #1   Title Improve Berg balance score to >/= 52/56 to reduce fall risk. (LTGs due 01/18/2020)    Baseline 46/56 on 12-06-19. 01/08/20 Berg=56/56    Time 6    Period Weeks    Status Achieved      PT LONG TERM GOAL #2   Title Pt will amb. 1000' on flat even and uneven surface without device modified independently with pt demonstrating ability to look ahead rather than down at feet.    Time 6    Status New      PT LONG TERM GOAL #3   Title Pt will report negotiating steps at home to 2nd level (rather than using chair lift) modified independently.    Baseline Pt has been getting upstairs in home now on own. Performed in clinic mod I on 01/08/20    Time 6    Period Weeks    Status Achieved      PT LONG TERM GOAL #4   Title Independent in updated HEP for balance and coordination exercises.    Time 6    Period Weeks    Status New      PT LONG TERM GOAL #5   Title Improve FGA score by at least 5 points to increase safety with gait and to reduce fall risk.    Baseline FGA on 12/11/19 14/30    Time 6    Period Weeks    Status New                 Plan - 01/08/20 1717    Clinical Impression Statement Pt continues to show good progress  with improving coordination in right LE. Ernesta Amble today and pt met goal with score of 56/56. Pt is now able to negotiate his steps mod I to get to bedroom. Pt progressing well towards all goals.    Personal Factors and Comorbidities Behavior Pattern;Comorbidity 2    Comorbidities anxiety, HTN, DJD, CAD, COPD with chronic bronchitis, chronic diastolic CHF, 12-22-60 lt heart cath. & coronary angiography, s/p MI 2004    Examination-Activity Limitations Bend;Stairs;Squat;Locomotion Level;Transfers    Examination-Participation Restrictions Cleaning;Shop;[employer]/development worker work activities   Stability/Clinical Decision Making Evolving/Moderate complexity    Rehab Potential Good    PT Frequency 2x / week    PT Duration 6 weeks    PT Treatment/Interventions ADLs/Self Care Home Management;DME Instruction;Gait training;Stair training;Therapeutic activities;Therapeutic exercise;Balance training;Neuromuscular re-education;Patient/family education    PT Next Visit Plan 10th visit progress note next visit. Discussed pending discharge next visit or 2 pending no other changes. Started to check LTGs and pt meeting with significant improvements. Finalize HEP. high level dynamic balance and gait exercises and coordination ex. RLE  PT Home Exercise Plan coordination ex. RLE in standing (12-06-19)    Consulted and Agree with Plan of Care Patient           Patient will benefit from skilled therapeutic intervention in order to improve the following deficits and impairments:  Abnormal gait, Decreased activity tolerance, Decreased balance, Decreased coordination, Decreased strength, Impaired sensation  Visit Diagnosis: Other abnormalities of gait and mobility  Unsteadiness on feet     Problem List Patient Active Problem List   Diagnosis Date Noted  . Allergic rhinitis 12/26/2019  . Cerebrovascular accident (CVA) of left thalamus ([place]) 12/05/2019  . Right hemiparesis ([place])  12/04/2019  . Chronic renal insufficiency, stage 3 (moderate) 07/05/2019  . Mild cognitive disorder 10/12/2017  . Viral pneumonitis 07/22/2016  . COPD with asthma ([place]) 07/22/2016  . Depression with anxiety 07/13/2016  . Acute respiratory failure with hypoxia ([place]) 07/13/2016  . Chronic diastolic CHF (congestive heart failure) ([place]) 07/13/2016  . Sepsis, unspecified organism ([place]) 07/13/2016  . Acute pain of right knee 06/30/2016  . Fatigue 06/30/2016  . Creatinine elevation 06/30/2016  . Dyspnea on exertion   . COPD with acute exacerbation ([place]) 04/04/2015  . Elevated IgE level 03/18/2014  . Well adult exam 03/05/2014  . COPD with chronic bronchitis ([place]) 12/13/2013  . Venous insufficiency 11/03/2011  . Edema 11/03/2011  . Memory disturbance 05/13/2011  . CAD (coronary artery disease) 09/17/2010  . 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 Incidental pulmonary nodule 06/17/2009  . COLONIC POLYPS 10/17/2007  . BRONCHITIS, RECURRENT 10/17/2007  . GERD (gastroesophageal reflux disease) 10/17/2007  . Diverticulosis of large intestine 10/17/2007  . DEGENERATIVE JOINT DISEASE 10/17/2007  . Hyperglycemia 10/17/2007  . Dyslipidemia 10/02/2007  . Anxiety state 10/02/2007  . Obstructive sleep apnea 10/02/2007  . Essential hypertension 10/02/2007  . LOW BACK PAIN SYNDROME 10/02/2007    Electa Sniff, PT, DPT, NCS 01/08/2020, 5:25 PM  [facility] [address] [place], [place], [address] Phone: [phone]   Fax:  [phone]  Name: Larry Stone MRN: [id number] Date of Birth: [birth year]

## 2020-01-09 ENCOUNTER — Encounter: Payer: Medicare Other | Admitting: Occupational Therapy

## 2020-01-10 ENCOUNTER — Ambulatory Visit (INDEPENDENT_AMBULATORY_CARE_PROVIDER_SITE_OTHER): Payer: Medicare Other

## 2020-01-10 ENCOUNTER — Other Ambulatory Visit: Payer: Self-pay

## 2020-01-10 ENCOUNTER — Ambulatory Visit: Payer: Medicare Other | Admitting: Physical Therapy

## 2020-01-10 DIAGNOSIS — I4891 Unspecified atrial fibrillation: Secondary | ICD-10-CM | POA: Diagnosis not present

## 2020-01-10 DIAGNOSIS — Z8673 Personal history of transient ischemic attack (TIA), and cerebral infarction without residual deficits: Secondary | ICD-10-CM | POA: Diagnosis not present

## 2020-01-10 DIAGNOSIS — I69351 Hemiplegia and hemiparesis following cerebral infarction affecting right dominant side: Secondary | ICD-10-CM | POA: Diagnosis not present

## 2020-01-10 DIAGNOSIS — R278 Other lack of coordination: Secondary | ICD-10-CM

## 2020-01-10 DIAGNOSIS — R2689 Other abnormalities of gait and mobility: Secondary | ICD-10-CM

## 2020-01-10 DIAGNOSIS — R2681 Unsteadiness on feet: Secondary | ICD-10-CM

## 2020-01-11 ENCOUNTER — Encounter: Payer: Self-pay | Admitting: Physical Therapy

## 2020-01-11 NOTE — Therapy (Signed)
[facility] [address] [place], [place], [address] Phone: [phone]   Fax:  [phone]  Physical Therapy Treatment & 10th visit progress note   Reporting period; 12-06-19 - 01-10-20 See below for progress towards goals  Patient Details  Name: Larry Stone MRN: [id number] Date of Birth: [birth year] Referring Provider (PT): Dr. Lew Dawes   Encounter Date: 01/10/2020   PT End of Session - 01/11/20 1405    Visit Number 10    Number of Visits 13   eval to 12 visits   Authorization Type [facility] Medicare    Authorization Time Period 12-06-19 - 03-04-20    PT Start Time 1235    PT Stop Time 1317    PT Time Calculation (min) 42 min    Equipment Utilized During Treatment --    Activity Tolerance Patient tolerated treatment well    Behavior During Therapy [facility] for tasks assessed/performed           Past Medical History:  Diagnosis Date  . Anxiety   . CAD    a. acute inferior lateral wall infarction in September 2004 treated medically. b.  cath 06/17/16 showing mild nonobstructive CAD with 30-40% ostial LM (eccentric), elevated LV filling pressures and normal LV function.  . Chronic diastolic CHF (congestive heart failure) ([place])   . CKD (chronic kidney disease), stage II   . COPD (chronic obstructive pulmonary disease) ([place])   . Diabetes mellitus   . Diverticulosis   . DJD (degenerative joint disease)   . GERD (gastroesophageal reflux disease)   . HTN (hypertension)   . Hyperlipidemia   . Low back pain syndrome   . Myocardial infarction ([place]) 2004  . Obesity   . OSA (obstructive sleep apnea)   . RBBB   . Recurrent aspiration bronchitis/pneumonia   . Recurrent aspiration pneumonia ([place])    [name] 07/13/2016  . Thrombocytopenia ([employer])   . Tubular adenoma of colon 2014    Past Surgical History:  Procedure Laterality Date  . CARDIAC CATHETERIZATION    . LEFT HEART CATH AND CORONARY ANGIOGRAPHY N/A 06/17/2016    Procedure: Left Heart Cath and Coronary Angiography;  Surgeon: Burnell Blanks, MD;  Location: [facility];  Service: Cardiovascular;  Laterality: N/A;  . UVULOPALATOPHARYNGOPLASTY     surgery for OSA    There were no vitals filed for this visit.   Subjective Assessment - 01/11/20 1342    Subjective Pt states he is doing well - is walking outside in his yard at home which is not grassy, but is uneven terrain wth limbs, sticks, etc. and says he is not having any trouble    Pertinent History s/p Lt  thalamic CVA:  Anxiety, essential HTN, DJD, CAD, COPD with chronic bronchitis, chronic diaastolic CHF, h/o MI in 1660: 06-17-16 L heart catheterization & coroncay angiography    Diagnostic tests CT and MRI both done.    Patient Stated Goals Improve balance and walking    Currently in Pain? No/denies    Pain Onset In the past 7 days                             [facility] Adult PT Treatment/Exercise - 01/11/20 0001      Ambulation/Gait   Ambulation/Gait Yes    Ambulation/Gait Assistance 5: Supervision    Ambulation/Gait Assistance Details pt performed head turns side to side and up/down approx. 30' x1 rep each  Ambulation Distance (Feet) 100 Feet    Assistive device None    Gait Pattern Within Functional Limits    Stairs Yes    Stairs Assistance 6: Modified independent (Device/Increase time)    Stair Management Technique Alternating pattern;One rail Left    Number of Stairs 4               Balance Exercises - 01/11/20 0001      Balance Exercises: Standing   Standing Eyes Closed Head turns;Foam/compliant surface;5 reps    Rockerboard Anterior/posterior;Head turns;10 reps;Intermittent UE support    Partial Tandem Stance Eyes open;2 reps;20 secs;Intermittent upper extremity support   with UE support prn   Other Standing Exercises Pt performed braiding inside // bars 4 reps (10' x 4 reps) without UE support on // bars    Other Standing Exercises Comments  pt performed rolling ball forward and back for improved coordination RLE ; amb. on tip toes 40' x 2 reps for balance and Rt gastroc strengthening             PT Education - 01/11/20 1405    Education Details discussed current status and progress - plan D/C next week - pt agrees    [employer]) Educated Patient    Methods Explanation    Comprehension Verbalized understanding            PT Short Term Goals - 01/11/20 1410      PT SHORT TERM GOAL #1   Title Improve Berg balance test score from 46/56 to >/= 49/56 to reduce fall risk.    Baseline 12/27/19: 52/54 scored today    Time --   extended due to delay in start of treatment   Status Achieved      PT SHORT TERM GOAL #2   Title Pt will amb. 500' with no device on flat even surface with supervision.    Baseline 12/27/19: met in session today    Status Achieved      PT SHORT TERM GOAL #3   Title Increase gait velocity by at least .4 ft/sec for incr. gait efficiency.    Baseline 12/27/19: 3.45 ft/sec no AD    Status Achieved      PT SHORT TERM GOAL #4   Title Independent in HEP for balance and coordination exercises.    Baseline 12/27/19: met with current program, plus some things he does on his own    Status Achieved             PT Long Term Goals - 01/10/20 1236      PT LONG TERM GOAL #1   Title Improve Berg balance score to >/= 52/56 to reduce fall risk. (LTGs due 01/18/2020)    Baseline 46/56 on 12-06-19. 01/08/20 Berg=56/56    Time 6    Period Weeks    Status Achieved      PT LONG TERM GOAL #2   Title Pt will amb. 1000' on flat even and uneven surface without device modified independently with pt demonstrating ability to look ahead rather than down at feet.    Time 6    Status New      PT LONG TERM GOAL #3   Title Pt will report negotiating steps at home to 2nd level (rather than using chair lift) modified independently.    Baseline Pt has been getting upstairs in home now on own. Performed in clinic mod I on 01/08/20     Time 6    Period Weeks  Status Achieved      PT LONG TERM GOAL #4   Title Independent in updated HEP for balance and coordination exercises.    Time 6    Period Weeks    Status New      PT LONG TERM GOAL #5   Title Improve FGA score by at least 5 points to increase safety with gait and to reduce fall risk.    Baseline FGA on 12/11/19 14/30    Time 6    Period Weeks    Status New                 Plan - 01/11/20 1406    Clinical Impression Statement This 10th visit progress note covers dates 12-06-19 - 01-10-20.  Pt has made excellent progress and has met 4/4 STG's and LTG #1 as Berg balance test score = 56/56.  Plan D/C next session - pt agrees with planned D/C and states he is pleased with his current status/progress.    Personal Factors and Comorbidities Behavior Pattern;Comorbidity 2    Comorbidities anxiety, HTN, DJD, CAD, COPD with chronic bronchitis, chronic diastolic CHF, 05-21-00 lt heart cath. & coronary angiography, s/p MI 2004    Examination-Activity Limitations Bend;Stairs;Squat;Locomotion Level;Transfers    Examination-Participation Restrictions Cleaning;Shop;Yard International aid/development worker work activities   Stability/Clinical Decision Making Evolving/Moderate complexity    Rehab Potential Good    PT Frequency 2x / week    PT Duration 6 weeks    PT Treatment/Interventions ADLs/Self Care Home Management;DME Instruction;Gait training;Stair training;Therapeutic activities;Therapeutic exercise;Balance training;Neuromuscular re-education;Patient/family education    PT Next Visit Plan D/C next session    PT Home Exercise Plan coordination ex. RLE in standing (12-06-19)    Consulted and Agree with Plan of Care Patient           Patient will benefit from skilled therapeutic intervention in order to improve the following deficits and impairments:  Abnormal gait, Decreased activity tolerance, Decreased balance, Decreased coordination, Decreased  strength, Impaired sensation  Visit Diagnosis: Unsteadiness on feet  Decreased coordination  Other abnormalities of gait and mobility     Problem List Patient Active Problem List   Diagnosis Date Noted  . Allergic rhinitis 12/26/2019  . Cerebrovascular accident (CVA) of left thalamus ([place]) 12/05/2019  . Right hemiparesis ([place]) 12/04/2019  . Chronic renal insufficiency, stage 3 (moderate) 07/05/2019  . Mild cognitive disorder 10/12/2017  . Viral pneumonitis 07/22/2016  . COPD with asthma ([place]) 07/22/2016  . Depression with anxiety 07/13/2016  . Acute respiratory failure with hypoxia ([place]) 07/13/2016  . Chronic diastolic CHF (congestive heart failure) ([place]) 07/13/2016  . Sepsis, unspecified organism ([place]) 07/13/2016  . Acute pain of right knee 06/30/2016  . Fatigue 06/30/2016  . Creatinine elevation 06/30/2016  . Dyspnea on exertion   . COPD with acute exacerbation ([place]) 04/04/2015  . Elevated IgE level 03/18/2014  . Well adult exam 03/05/2014  . COPD with chronic bronchitis ([place]) 12/13/2013  . Venous insufficiency 11/03/2011  . Edema 11/03/2011  . Memory disturbance 05/13/2011  . CAD (coronary artery disease) 09/17/2010  . 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 Incidental pulmonary nodule 06/17/2009  . COLONIC POLYPS 10/17/2007  . BRONCHITIS, RECURRENT 10/17/2007  . GERD (gastroesophageal reflux disease) 10/17/2007  . Diverticulosis of large intestine 10/17/2007  . DEGENERATIVE JOINT DISEASE 10/17/2007  . Hyperglycemia 10/17/2007  . Dyslipidemia 10/02/2007  . Anxiety state 10/02/2007  . Obstructive sleep apnea 10/02/2007  . Essential hypertension 10/02/2007  . LOW BACK PAIN SYNDROME 10/02/2007  EPPIRJ, JOACZ YSAYTKZ, PT 01/11/2020, 2:12 PM  [facility] [address] [place], [place], [address] Phone: [phone]   Fax:  [phone]  Name: DELRON COMER MRN: [id number] Date of Birth:  Mar 20, 1951

## 2020-01-14 ENCOUNTER — Ambulatory Visit: Payer: Medicare Other | Admitting: Cardiovascular Disease

## 2020-01-15 ENCOUNTER — Encounter: Payer: Self-pay | Admitting: Physical Therapy

## 2020-01-15 ENCOUNTER — Ambulatory Visit: Payer: Medicare Other | Attending: Internal Medicine

## 2020-01-15 ENCOUNTER — Ambulatory Visit: Payer: Medicare Other | Admitting: Physical Therapy

## 2020-01-15 ENCOUNTER — Other Ambulatory Visit: Payer: Self-pay

## 2020-01-15 DIAGNOSIS — I69351 Hemiplegia and hemiparesis following cerebral infarction affecting right dominant side: Secondary | ICD-10-CM | POA: Diagnosis not present

## 2020-01-15 DIAGNOSIS — R2681 Unsteadiness on feet: Secondary | ICD-10-CM

## 2020-01-15 DIAGNOSIS — Z23 Encounter for immunization: Secondary | ICD-10-CM

## 2020-01-15 DIAGNOSIS — R2689 Other abnormalities of gait and mobility: Secondary | ICD-10-CM

## 2020-01-15 NOTE — Progress Notes (Signed)
   Covid-19 Vaccination Clinic  Name:  Larry Stone    MRN: [id number] DOB: November 25, 1950  01/15/2020  Larry Stone was observed post Covid-19 immunization for 15 minutes without incident. He was provided with Vaccine Information Sheet and instruction to access the V-Safe system.   Larry Stone was instructed to call 911 with any severe reactions post vaccine: Marland Kitchen Difficulty breathing  . Swelling of face and throat  . A fast heartbeat  . A bad rash all over body  . Dizziness and weakness

## 2020-01-16 NOTE — Therapy (Signed)
[facility] [address] [place], [place], [address] Phone: [phone]   Fax:  [phone]  Physical Therapy Treatment  Patient Details  Name: Larry Stone MRN: [id number] Date of Birth: 08/11/1950 Referring Provider (PT): Dr. Lew Dawes   Encounter Date: 01/15/2020   PT End of Session - 01/16/20 1152    Visit Number 11    Number of Visits 13   eval to 12 visits   Authorization Type [facility] Medicare    Authorization Time Period 12-06-19 - 03-04-20    PT Start Time 1230    PT Stop Time 1315    PT Time Calculation (min) 45 min    Activity Tolerance Patient tolerated treatment well    Behavior During Therapy [facility] for tasks assessed/performed           Past Medical History:  Diagnosis Date  . Anxiety   . CAD    a. acute inferior lateral wall infarction in September 2004 treated medically. b.  cath 06/17/16 showing mild nonobstructive CAD with 30-40% ostial LM (eccentric), elevated LV filling pressures and normal LV function.  . Chronic diastolic CHF (congestive heart failure) ([place])   . CKD (chronic kidney disease), stage II   . COPD (chronic obstructive pulmonary disease) ([place])   . Diabetes mellitus   . Diverticulosis   . DJD (degenerative joint disease)   . GERD (gastroesophageal reflux disease)   . HTN (hypertension)   . Hyperlipidemia   . Low back pain syndrome   . Myocardial infarction ([place]) 2004  . Obesity   . OSA (obstructive sleep apnea)   . RBBB   . Recurrent aspiration bronchitis/pneumonia   . Recurrent aspiration pneumonia ([place])    [name] 07/13/2016  . Thrombocytopenia ([place])   . Tubular adenoma of colon 2014    Past Surgical History:  Procedure Laterality Date  . CARDIAC CATHETERIZATION    . LEFT HEART CATH AND CORONARY ANGIOGRAPHY N/A 06/17/2016   Procedure: Left Heart Cath and Coronary Angiography;  Surgeon: Burnell Blanks, MD;  Location: [facility];  Service: Cardiovascular;   Laterality: N/A;  . UVULOPALATOPHARYNGOPLASTY     surgery for OSA    There were no vitals filed for this visit.   Subjective Assessment - 01/15/20 1230    Subjective Pt states he feels like he is doing well - states he did everything he could think of this weekend at home and did not have any problem; he went up and down a 3 foot step ladder and then a 6 foot step ladder    Pertinent History s/p Lt  thalamic CVA:  Anxiety, essential HTN, DJD, CAD, COPD with chronic bronchitis, chronic diaastolic CHF, h/o MI in 7096: 06-17-16 L heart catheterization & coroncay angiography    Diagnostic tests CT and MRI both done.    Patient Stated Goals Improve balance and walking    Currently in Pain? No/denies    Pain Onset --              [facility] PT Assessment - 01/16/20 0001      Functional Gait  Assessment   Gait assessed  Yes    Gait Level Surface Walks 20 ft in less than 5.5 sec, no assistive devices, good speed, no evidence for imbalance, normal gait pattern, deviates no more than 6 in outside of the 12 in walkway width.    Change in Gait Speed Able to smoothly change walking speed without loss of balance or gait  deviation. Deviate no more than 6 in outside of the 12 in walkway width.    Gait with Horizontal Head Turns Performs head turns smoothly with slight change in gait velocity (eg, minor disruption to smooth gait path), deviates 6-10 in outside 12 in walkway width, or uses an assistive device.    Gait with Vertical Head Turns Performs head turns with no change in gait. Deviates no more than 6 in outside 12 in walkway width.    Gait and Pivot Turn Pivot turns safely within 3 sec and stops quickly with no loss of balance.    Step Over Obstacle Is able to step over 2 stacked shoe boxes taped together (9 in total height) without changing gait speed. No evidence of imbalance.    Gait with Narrow Base of Support Is able to ambulate for 10 steps heel to toe with no staggering.    Gait with Eyes  Closed Walks 20 ft, uses assistive device, slower speed, mild gait deviations, deviates 6-10 in outside 12 in walkway width. Ambulates 20 ft in less than 9 sec but greater than 7 sec.    Ambulating Backwards Walks 20 ft, no assistive devices, good speed, no evidence for imbalance, normal gait    Steps Alternating feet, no rail.    Total Score 28                         [facility] Adult PT Treatment/Exercise - 01/16/20 0001      Ambulation/Gait   Ambulation/Gait Yes    Ambulation/Gait Assistance 6: Modified independent (Device/Increase time)    Ambulation Distance (Feet) 1000 Feet    Assistive device None    Gait Pattern Within Functional Limits    Ambulation Surface Level;Unlevel;Indoor;Outdoor;Paved    Stairs Yes    Stairs Assistance 5: Supervision   pt modified independent at home   Stair Management Technique Alternating pattern    Number of Stairs 4    Height of Stairs 6    Curb 6: Modified independent (Device/increase time)                  PT Education - 01/16/20 1148    Education Details reviewed HEP; discussed goals, progress and D/C today - pt agrees with progress towards goals and feels he is ready for D/C    Person(s) Educated Patient    Methods Explanation    Comprehension Verbalized understanding            PT Short Term Goals - 01/16/20 1153      PT SHORT TERM GOAL #1   Title Improve Berg balance test score from 46/56 to >/= 49/56 to reduce fall risk.    Baseline 12/27/19: 52/54 scored today    Time --   extended due to delay in start of treatment   Status Achieved      PT SHORT TERM GOAL #2   Title Pt will amb. 500' with no device on flat even surface with supervision.    Baseline 12/27/19: met in session today    Status Achieved      PT SHORT TERM GOAL #3   Title Increase gait velocity by at least .4 ft/sec for incr. gait efficiency.    Baseline 12/27/19: 3.45 ft/sec no AD    Status Achieved      PT SHORT TERM GOAL #4   Title Independent  in HEP for balance and coordination exercises.    Baseline 12/27/19: met with current program,  plus some things he does on his own    Status Achieved             PT Long Term Goals - 01/15/20 1233      PT LONG TERM GOAL #1   Title Improve Berg balance score to >/= 52/56 to reduce fall risk. (LTGs due 01/18/2020)    Baseline 46/56 on 12-06-19. 01/08/20 Berg=56/56    Time 6    Period Weeks    Status Achieved      PT LONG TERM GOAL #2   Title Pt will amb. 1000' on flat even and uneven surface without device modified independently with pt demonstrating ability to look ahead rather than down at feet.    Baseline met per pt report -- 01-15-20    Time 6    Status Achieved      PT LONG TERM GOAL #3   Title Pt will report negotiating steps at home to 2nd level (rather than using chair lift) modified independently.    Baseline Pt has been getting upstairs in home now on own. Performed in clinic mod I on 01/08/20    Time 6    Period Weeks    Status Achieved      PT LONG TERM GOAL #4   Title Independent in updated HEP for balance and coordination exercises.    Baseline met 01-15-20    Time 6    Period Weeks    Status Achieved      PT LONG TERM GOAL #5   Title Improve FGA score by at least 5 points to increase safety with gait and to reduce fall risk.    Baseline FGA on 12/11/19 14/30; score 28/30 on 01-15-20    Time 6    Period Weeks    Status Achieved                 Plan - 01/16/20 1154    Clinical Impression Statement Pt has met 5/5 LTG's - pt is pleased with progress and feels he is ready for discharge.    Personal Factors and Comorbidities Behavior Pattern;Comorbidity 2    Comorbidities anxiety, HTN, DJD, CAD, COPD with chronic bronchitis, chronic diastolic CHF, 08-24-30 lt heart cath. & coronary angiography, s/p MI 2004    Examination-Activity Limitations Bend;Stairs;Squat;Locomotion Level;Transfers    Examination-Participation Restrictions Cleaning;Shop;Yard  International aid/development worker work activities   Stability/Clinical Decision Making Evolving/Moderate complexity    Rehab Potential Good    PT Frequency 2x / week    PT Duration 6 weeks    PT Treatment/Interventions ADLs/Self Care Home Management;DME Instruction;Gait training;Stair training;Therapeutic activities;Therapeutic exercise;Balance training;Neuromuscular re-education;Patient/family education    PT Next Visit Plan N/A - D/C    PT Home Exercise Plan coordination ex. RLE in standing (12-06-19)    Consulted and Agree with Plan of Care Patient           Patient will benefit from skilled therapeutic intervention in order to improve the following deficits and impairments:  Abnormal gait, Decreased activity tolerance, Decreased balance, Decreased coordination, Decreased strength, Impaired sensation  Visit Diagnosis: Unsteadiness on feet  Other abnormalities of gait and mobility     Problem List Patient Active Problem List   Diagnosis Date Noted  . Allergic rhinitis 12/26/2019  . Cerebrovascular accident (CVA) of left thalamus ([place]) 12/05/2019  . Right hemiparesis ([place]) 12/04/2019  . Chronic renal insufficiency, stage 3 (moderate) 07/05/2019  . Mild cognitive disorder 10/12/2017  . Viral pneumonitis 07/22/2016  . COPD  with asthma ([place]) 07/22/2016  . Depression with anxiety 07/13/2016  . Acute respiratory failure with hypoxia ([place]) 07/13/2016  . Chronic diastolic CHF (congestive heart failure) ([place]) 07/13/2016  . Sepsis, unspecified organism ([place]) 07/13/2016  . Acute pain of right knee 06/30/2016  . Fatigue 06/30/2016  . Creatinine elevation 06/30/2016  . Dyspnea on exertion   . COPD with acute exacerbation ([place]) 04/04/2015  . Elevated IgE level 03/18/2014  . Well adult exam 03/05/2014  . COPD with chronic bronchitis ([place]) 12/13/2013  . Venous insufficiency 11/03/2011  . Edema 11/03/2011  . Memory disturbance 05/13/2011  . CAD (coronary artery disease)  09/17/2010  . 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 Incidental pulmonary nodule 06/17/2009  . COLONIC POLYPS 10/17/2007  . BRONCHITIS, RECURRENT 10/17/2007  . GERD (gastroesophageal reflux disease) 10/17/2007  . Diverticulosis of large intestine 10/17/2007  . DEGENERATIVE JOINT DISEASE 10/17/2007  . Hyperglycemia 10/17/2007  . Dyslipidemia 10/02/2007  . Anxiety state 10/02/2007  . Obstructive sleep apnea 10/02/2007  . Essential hypertension 10/02/2007  . LOW BACK PAIN SYNDROME 10/02/2007      PHYSICAL THERAPY DISCHARGE SUMMARY  Visits from Start of Care: 11  Current functional level related to goals / functional outcomes: See above for progress towards goals   Remaining deficits: None regarding mobility   Education / Equipment: Pt has been instructed in HEP for balance exercises; pt also reports he is walking a lot at home Plan: Patient agrees to discharge.  Patient goals were met. Patient is being discharged due to meeting the stated rehab goals.  ?????        Alda Lea, PT 01/16/2020, 12:13 PM  [facility] [address] [place], [place], [address] Phone: [phone]   Fax:  [phone]  Name: Larry Stone MRN: [id number] Date of Birth: [birth year]

## 2020-01-17 ENCOUNTER — Encounter: Payer: Medicare Other | Admitting: Occupational Therapy

## 2020-01-17 ENCOUNTER — Ambulatory Visit: Payer: Medicare Other | Admitting: Physical Therapy

## 2020-01-20 ENCOUNTER — Other Ambulatory Visit: Payer: Self-pay | Admitting: Internal Medicine

## 2020-01-20 DIAGNOSIS — E785 Hyperlipidemia, unspecified: Secondary | ICD-10-CM

## 2020-01-22 ENCOUNTER — Encounter: Payer: Medicare Other | Admitting: Occupational Therapy

## 2020-01-22 ENCOUNTER — Ambulatory Visit: Payer: Medicare Other | Admitting: Internal Medicine

## 2020-01-24 ENCOUNTER — Encounter: Payer: Medicare Other | Admitting: Occupational Therapy

## 2020-01-28 ENCOUNTER — Encounter: Payer: Medicare Other | Admitting: Occupational Therapy

## 2020-01-30 ENCOUNTER — Encounter: Payer: Medicare Other | Admitting: Occupational Therapy

## 2020-01-30 NOTE — Progress Notes (Signed)
Cardiology Office Note    Date:  02/05/2020   ID:  Larry, Stone February 27, 1951, MRN [id number]  PCP:  Larry Anger, MD  Cardiologist: Larry Chandler, MD EPS: None  No chief complaint on file.   History of Present Illness:  Larry Stone is a 69 y.o. 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 Stress myoview in June 2015 with inferolateral scar and no ischemia Cardiac cath on 06/17/16 with 40% left main stenosis, mild disease in the diagonal and proximal circumflex.  HLD, HTN, OSA and chronic diastolic CHF. He has history of myalgias with Lipitor and Crestor so he has been on Pravastatin. He is allergic to Ace-inhibitors and ARBs and has has lower extremity edema with Norvasc. He was started on hydralazine in May of 2020.  Repatha but he has not wished to start due to cost.   He had a stroke mid August 2021. He left the ED waiting area before being seen by a physician. EKG with sinus that night. Dr. [name] saw him in the office and arrange a head CT and brain MRI which confirmed the stroke. Carotid artery dopplers August 2021 with mild bilateral carotid artery disease.    Patient saw Dr. Angelena Form 12/28/2019 and 30-day monitor -NSR with first degree AV block &  PVC and 2D echo which was done 12/28/2018 and showed EF of 45 to 50% with mild LVH and grade 1 DD, hypokinesis of the basal mid inferior lateral wall, reviewed by Dr. Angelena Form and no changes made. Went over results with the patient. He's doing well without cardiac complaints. Finished rehab and only has residual hand and face tingling.         Past Medical History:  Diagnosis Date   Anxiety    CAD    a. acute inferior lateral wall infarction in September 2004 treated medically. b.  cath 06/17/16 showing mild nonobstructive CAD with 30-40% ostial LM (eccentric), elevated LV filling pressures and normal LV function.    Chronic diastolic CHF (congestive heart failure) (HCC)    CKD (chronic kidney disease), stage II    COPD (chronic obstructive pulmonary disease) (HCC)    Diabetes mellitus    Diverticulosis    DJD (degenerative joint disease)    GERD (gastroesophageal reflux disease)    HTN (hypertension)    Hyperlipidemia    Low back pain syndrome    Myocardial infarction ([place]) 2004   Obesity    OSA (obstructive sleep apnea)    RBBB    Recurrent aspiration bronchitis/pneumonia    Recurrent aspiration pneumonia ([place])    [name] 07/13/2016   Thrombocytopenia (HCC)    Tubular adenoma of colon 2014    Past Surgical History:  Procedure Laterality Date   CARDIAC CATHETERIZATION     LEFT HEART CATH AND CORONARY ANGIOGRAPHY N/A 06/17/2016   Procedure: Left Heart Cath and Coronary Angiography;  Surgeon: Burnell Blanks, MD;  Location: [facility];  Service: Cardiovascular;  Laterality: N/A;   UVULOPALATOPHARYNGOPLASTY     surgery for OSA    Current Medications: Current Meds  Medication Sig   albuterol (PROAIR HFA) 108 (90 Base) MCG/ACT inhaler Inhale 1-2 puffs into the lungs every 6 (six) hours as needed for wheezing or shortness of breath.   albuterol (PROVENTIL) (2.5 MG/3ML) 0.083% nebulizer solution Use one vial in the nebulizer every 4-6 hours if needed for cough or wheeze  ALPRAZolam (XANAX) 0.5 MG tablet Take 1 tablet (0.5 mg total) by mouth 3 (three) times daily as needed.   ammonium lactate (LAC-HYDRIN) 12 % lotion APPLY TOPICALLY AS DIRECTED.   budesonide-formoterol (SYMBICORT) 160-4.5 MCG/ACT inhaler INHALE 2 PUFFS INTO THE LUNGS 2 (TWO) TIMES DAILY.   buPROPion (WELLBUTRIN SR) 150 MG 12 hr tablet Take 1 tablet (150 mg total) by mouth 2 (two) times daily.   carvedilol (COREG) 25 MG tablet TAKE 1 TAB BY MOUTH 2 TIMES DAILY WITH A MEAL.ANNUAL APPT IN FEBUARY MUST SEE PROVIDER FOR REFILLS   cetirizine (ZYRTEC) 10 MG tablet Take 1 tablet (10 mg total) by  mouth daily.   Cholecalciferol (VITAMIN D3) 5000 UNITS CAPS Take one tablet by mouth daily   clopidogrel (PLAVIX) 75 MG tablet Take 1 tablet (75 mg total) by mouth daily.   clotrimazole-betamethasone (LOTRISONE) cream APPLY AS DIRECTED   Coenzyme Q10 (COQ10) 100 MG CAPS Take as directed   ezetimibe (ZETIA) 10 MG tablet Take 1 tablet (10 mg total) by mouth daily.   hydrALAZINE (APRESOLINE) 50 MG tablet Take 1 tablet (50 mg total) by mouth 3 (three) times daily.   metFORMIN (GLUCOPHAGE) 500 MG tablet TAKE 1 TABLET BY MOUTH EVERY DAY WITH BREAKFAST   montelukast (SINGULAIR) 10 MG tablet TAKE 1 TABLET BY MOUTH EVERYDAY AT BEDTIME   nitroGLYCERIN (NITROSTAT) 0.4 MG SL tablet PLACE 1 TABLET UNDER THE TONGUE EVERY 5 MINUTES AS NEEDED. FOR UP TO 3 DOSES.   pravastatin (PRAVACHOL) 40 MG tablet Take 1 tablet (40 mg total) by mouth daily.   sildenafil (VIAGRA) 100 MG tablet TAKE 1 TABLET BY MOUTH AS NEEDED FOR ERECTILE DYSFUNCTION.   vitamin C (ASCORBIC ACID) 500 MG tablet Take 500 mg by mouth daily.      Allergies:   Ace inhibitors, Angiotensin receptor blockers, Aspirin, Amlodipine, Crestor [rosuvastatin calcium], Lipitor [atorvastatin calcium], Codeine, Irbesartan, and Ramipril   Social History   Socioeconomic History   Marital status: Married    Spouse name: Not on file   Number of children: 1   Years of education: Not on file   Highest education level: Not on file  Occupational History   Occupation: Probation officer of [employer]  Tobacco Use   Smoking status: Former Smoker    Packs/day: 1.00    Years: 20.00    Pack years: 20.00    Types: Cigarettes, Cigars    Quit date: 05/08/1998    Years since quitting: 21.7   Smokeless tobacco: Never Used   Tobacco comment: quit in 2005  Vaping Use   Vaping Use: Never used  Substance and Sexual Activity   Alcohol use: Yes    Alcohol/week: 1.0 standard drink    Types: 1 Standard drinks or equivalent per week     Comment: social drinker   Drug use: No   Sexual activity: Yes  Other Topics Concern   Not on file  Social History Narrative   Not on file   Social Determinants of Health   Financial Resource Strain:    Difficulty of Paying Living Expenses: Not on file  Food Insecurity:    Worried About [place] in the Last Year: Not on file   [employer] of Food in the Last Year: Not on file  Transportation Needs:    Lack of Transportation (Medical): Not on file   Lack of Transportation (Non-Medical): Not on file  Physical Activity:    Days of Exercise per Week: Not on file   Minutes  of Exercise per Session: Not on file  Stress:    Feeling of Stress : Not on file  Social Connections:    Frequency of Communication with Friends and Family: Not on file   Frequency of Social Gatherings with Friends and Family: Not on file   Attends Religious Services: Not on file   Active Member of Clubs or Organizations: Not on file   Attends Archivist Meetings: Not on file   Marital Status: Not on file     Family History:  The patient's family history includes Colon cancer in his father and paternal uncle; Diabetes in his sister; Heart disease in his mother; Hypertension in his sister; Stroke in his mother.   ROS:   Please see the history of present illness.    ROS All other systems reviewed and are negative.   PHYSICAL EXAM:   VS:  BP 110/62    Pulse (!) 57    Ht 5\' 9"  (1.753 m)    Wt 192 lb (87.1 kg)    SpO2 98%    BMI 28.35 kg/m   Physical Exam  GEN: Well nourished, well developed, in no acute distress  Neck: no JVD, carotid bruits, or masses Cardiac:RRR; no murmurs, rubs, or gallops  Respiratory:  clear to auscultation bilaterally, normal work of breathing GI: soft, nontender, nondistended, + BS Ext: without cyanosis, clubbing, or edema, Good distal pulses bilaterally Neuro:  Alert and Oriented x 3 Psych: euthymic mood, full affect  Wt Readings from Last 3  Encounters:  02/05/20 192 lb (87.1 kg)  12/28/19 187 lb (84.8 kg)  12/26/19 187 lb (84.8 kg)      Studies/Labs Reviewed:   EKG:  EKG is not ordered today.    Recent Labs: 12/26/2019: ALT 16; BUN 18; Creat 1.54; Hemoglobin 14.5; Platelets 179; Potassium 4.4; Sodium 139   Lipid Panel    Component Value Date/Time   CHOL 144 12/26/2019 1040   TRIG 91 12/26/2019 1040   HDL 49 12/26/2019 1040   CHOLHDL 2.9 12/26/2019 1040   VLDL 12.6 06/19/2019 1253   LDLCALC 78 12/26/2019 1040    Additional studies/ records that were reviewed today include:  2D echo 9/10/2021IMPRESSIONS     1. Left ventricular ejection fraction, by estimation, is 45 to 50%. The  left ventricle has mildly decreased function. The left ventricle  demonstrates regional wall motion abnormalities (see scoring  diagram/findings for description). There is mild  concentric left ventricular hypertrophy. Left ventricular diastolic  parameters are consistent with Grade I diastolic dysfunction (impaired  relaxation). There is moderate hypokinesis of the left ventricular,  basal-mid inferolateral wall.   2. Right ventricular systolic function is normal. The right ventricular  size is normal.   3. The mitral valve is normal in structure. No evidence of mitral valve  regurgitation.   4. The aortic valve is tricuspid. Aortic valve regurgitation is not  visualized. No aortic stenosis is present.   Comparison(s): Prior images unable to be directly viewed, comparison made  by report only.   Echo 2015 Study Conclusions   - Left ventricle: The cavity size was normal. Systolic    function was normal. The estimated ejection fraction was    in the range of 55% to 60%. Probable mild hypokinesis of    the basal-midinferolateral and inferior myocardium.    Doppler parameters are consistent with abnormal left    ventricular relaxation (grade 1 diastolic dysfunction).  - Mitral valve: Calcified annulus.  Transthoracic  echocardiography.  M-mode,  complete 2D,  spectral Doppler, and color Doppler.  Height:  Height:  177.8cm. Height: 70in.  Weight:  Weight: 100.2kg. Weight:  220.5lb.  Body mass index:  BMI: 31.7kg/m^2.  Body surface  area:    BSA: 2.50m^2.  Blood pressure:     122/73.  Patient  status:  Outpatient.  Location:  Echo laboratory.   ------------------------------------------------------------   ------------------------------------------------------------  Left ventricle:  The cavity size was normal. Systolic  function was normal. The estimated ejection fraction was in  the range of 55% to 60%.  Regional wall motion  abnormalities:   Probable mild hypokinesis of the  basal-midinferolateral and inferior myocardium. Doppler  parameters are consistent with abnormal left ventricular  relaxation (grade 1 diastolic dysfunction).     ASSESSMENT:    1. Coronary artery disease involving native coronary artery of native heart without angina pectoris   2. Essential hypertension   3. Hyperlipidemia, unspecified hyperlipidemia type   4. Chronic diastolic CHF (congestive heart failure) ([place])   5. History of CVA (cerebrovascular accident)      PLAN:  In order of problems listed above:   1. CAD without angina: No chest pain. Cardiac cath March 2018 with mild to moderate non-obstructive CAD. Will continue Plavix, beta blocker and statin. Allergic to ASA.    2D echo 12/28/2019 showed low normal LVEF 45 to 50% with wall motion abnormality basal inferior lateral wall similar to echo in 2015 but LV function down some  2. HYPERTENSION:  BP controlled. Continue current meds.     3. HYPERLIPIDEMIA: LDL near goal at 78. Continue Pravachol 40 mg and Zetia.    4. Chronic diastolic CHF: compensated. Weight is stable. Lasix as needed.    5. Stroke: He has not seen neurology yet as he left the ED the night of his stroke before he could be seen. Has appt 02/06/20. Carotid artery dopplers without significant  disease.  Echo shows low normal LV function EF 45 to 50% with wall motion abnormality and grade 1 DD  reviewed by Dr. Angelena Form and no changes made.  30-day monitor-NSR with first degree AV block, PVC. Discussed Loop recorder with Dr. Curt Bears who recommends after he sees neuro and with  Patient who is agreeable. Will schedule f/u with EPS.    Medication Adjustments/Labs and Tests Ordered: Current medicines are reviewed at length with the patient today.  Concerns regarding medicines are outlined above.  Medication changes, Labs and Tests ordered today are listed in the Patient Instructions below. Patient Instructions  Medication Instructions:  Your physician recommends that you continue on your current medications as directed. Please refer to the Current Medication list given to you today.  *If you need a refill on your cardiac medications before your next appointment, please call your pharmacy*   Lab Work: None If you have labs (blood work) drawn today and your tests are completely normal, you will receive your results only by:  [place] (if you have MyChart) OR  A paper copy in the mail If you have any lab test that is abnormal or we need to change your treatment, we will call you to review the results.   Testing/Procedures: None   Follow-Up: At [facility], you and your health needs are our priority.  As part of our continuing mission to provide you with exceptional heart care, we have created designated Provider Care Teams.  These Care Teams include your primary Cardiologist (physician) and Advanced Practice Providers (APPs -  Physician Assistants and Nurse Practitioners)  who all work together to provide you with the care you need, when you need it.  We recommend signing up for the patient portal called "MyChart".  Sign up information is provided on this After Visit Summary.  MyChart is used to connect with patients for Virtual Visits (Telemedicine).  Patients are able to  view lab/test results, encounter notes, upcoming appointments, etc.  Non-urgent messages can be sent to your provider as well.   To learn more about what you can do with MyChart, go to [email or web address].    Your next appointment:   Our office will call you with an appointment to see Dr Curt Bears.  6 Mth f/u with Dr Angelena Form       Signed, Ermalinda Barrios, PA-C  02/05/2020 10:19 AM    [address], [place], [place]  [address] Phone: [phone]; Fax: [phone]

## 2020-02-05 ENCOUNTER — Other Ambulatory Visit: Payer: Self-pay

## 2020-02-05 ENCOUNTER — Ambulatory Visit: Payer: Medicare Other | Admitting: Physician Assistant

## 2020-02-05 ENCOUNTER — Encounter: Payer: Medicare Other | Admitting: Occupational Therapy

## 2020-02-05 ENCOUNTER — Encounter: Payer: Self-pay | Admitting: Physician Assistant

## 2020-02-05 VITALS — BP 110/62 | HR 57 | Ht 69.0 in | Wt 192.0 lb

## 2020-02-05 DIAGNOSIS — I5032 Chronic diastolic (congestive) heart failure: Secondary | ICD-10-CM

## 2020-02-05 DIAGNOSIS — I2510 Atherosclerotic heart disease of native coronary artery without angina pectoris: Secondary | ICD-10-CM

## 2020-02-05 DIAGNOSIS — I10 Essential (primary) hypertension: Secondary | ICD-10-CM

## 2020-02-05 DIAGNOSIS — E785 Hyperlipidemia, unspecified: Secondary | ICD-10-CM

## 2020-02-05 DIAGNOSIS — Z8673 Personal history of transient ischemic attack (TIA), and cerebral infarction without residual deficits: Secondary | ICD-10-CM

## 2020-02-05 NOTE — Patient Instructions (Signed)
Medication Instructions:  Your physician recommends that you continue on your current medications as directed. Please refer to the Current Medication list given to you today.  *If you need a refill on your cardiac medications before your next appointment, please call your pharmacy*   Lab Work: None If you have labs (blood work) drawn today and your tests are completely normal, you will receive your results only by: [place] MyChart Message (if you have MyChart) OR . A paper copy in the mail If you have any lab test that is abnormal or we need to change your treatment, we will call you to review the results.   Testing/Procedures: None   Follow-Up: At [facility], you and your health needs are our priority.  As part of our continuing mission to provide you with exceptional heart care, we have created designated Provider Care Teams.  These Care Teams include your primary Cardiologist (physician) and Advanced Practice Providers (APPs -  Physician Assistants and Nurse Practitioners) who all work together to provide you with the care you need, when you need it.  We recommend signing up for the patient portal called "MyChart".  Sign up information is provided on this After Visit Summary.  MyChart is used to connect with patients for Virtual Visits (Telemedicine).  Patients are able to view lab/test results, encounter notes, upcoming appointments, etc.  Non-urgent messages can be sent to your provider as well.   To learn more about what you can do with MyChart, go to [email or web address].    Your next appointment:   Our office will call you with an appointment to see Dr Curt Bears.  [place] f/u with Dr Angelena Form

## 2020-02-06 ENCOUNTER — Other Ambulatory Visit: Payer: Self-pay | Admitting: Internal Medicine

## 2020-02-06 ENCOUNTER — Other Ambulatory Visit: Payer: Self-pay | Admitting: Cardiovascular Disease

## 2020-02-06 ENCOUNTER — Ambulatory Visit: Payer: Medicare Other | Admitting: Neurology

## 2020-02-06 ENCOUNTER — Encounter: Payer: Self-pay | Admitting: Neurology

## 2020-02-06 ENCOUNTER — Other Ambulatory Visit: Payer: Self-pay | Admitting: Physician Assistant

## 2020-02-06 VITALS — BP 113/65 | HR 6 | Ht 69.0 in | Wt 192.6 lb

## 2020-02-06 DIAGNOSIS — I639 Cerebral infarction, unspecified: Secondary | ICD-10-CM

## 2020-02-06 DIAGNOSIS — I6381 Other cerebral infarction due to occlusion or stenosis of small artery: Secondary | ICD-10-CM

## 2020-02-06 DIAGNOSIS — R202 Paresthesia of skin: Secondary | ICD-10-CM | POA: Diagnosis not present

## 2020-02-06 MED ORDER — TOPIRAMATE 25 MG PO TABS: 25.0000 mg | ORAL_TABLET | Freq: Two times a day (BID) | ORAL | 2 refills | Status: DC

## 2020-02-06 NOTE — Patient Instructions (Signed)
I had a long d/w patient and his wife about his recent thalamic stroke and post stroke paresthesias,, risk for recurrent stroke/TIAs, personally independently reviewed imaging studies and stroke evaluation results and answered questions.Continue Plavix 75 mg daily for secondary stroke prevention and maintain strict control of hypertension with blood pressure goal below 130/90, diabetes with hemoglobin A1c goal below 6.5% and lipids with LDL cholesterol goal below 70 mg/dL. I also advised the patient to eat a healthy diet with plenty of whole grains, cereals, fruits and vegetables, exercise regularly and maintain ideal body weight .add Topamax 25 mg twice daily to help with his post stroke paresthesias.  I have discussed side effects with the patient and wife advised him to call me if needed.  I do not think he needs a loop recorder as his stroke was thalamic and due to small vessel disease and low possibility of atrial fibrillation.  Followup in the future with my nurse practitioner Janett Billow in 3 months or call earlier if necessary.  Stroke Prevention Some medical conditions and behaviors are associated with a higher chance of having a stroke. You can help prevent a stroke by making nutrition, lifestyle, and other changes, including managing any medical conditions you may have. What nutrition changes can be made?   Eat healthy foods. You can do this by: ? Choosing foods high in fiber, such as fresh fruits and vegetables and whole grains. ? Eating at least 5 or more servings of fruits and vegetables a day. Try to fill half of your plate at each meal with fruits and vegetables. ? Choosing lean protein foods, such as lean cuts of meat, poultry without skin, fish, tofu, beans, and nuts. ? Eating low-fat dairy products. ? Avoiding foods that are high in salt (sodium). This can help lower blood pressure. ? Avoiding foods that have saturated fat, trans fat, and cholesterol. This can help prevent high  cholesterol. ? Avoiding processed and premade foods.  Follow your health care provider's specific guidelines for losing weight, controlling high blood pressure (hypertension), lowering high cholesterol, and managing diabetes. These may include: ? Reducing your daily calorie intake. ? Limiting your daily sodium intake to 1,500 milligrams (mg). ? Using only healthy fats for cooking, such as olive oil, canola oil, or sunflower oil. ? Counting your daily carbohydrate intake. What lifestyle changes can be made?  Maintain a healthy weight. Talk to your health care provider about your ideal weight.  Get at least 30 minutes of moderate physical activity at least 5 days a week. Moderate activity includes brisk walking, biking, and swimming.  Do not use any products that contain nicotine or tobacco, such as cigarettes and e-cigarettes. If you need help quitting, ask your health care provider. It may also be helpful to avoid exposure to secondhand smoke.  Limit alcohol intake to no more than 1 drink a day for nonpregnant women and 2 drinks a day for men. One drink equals 12 oz of beer, 5 oz of wine, or 1 oz of hard liquor.  Stop any illegal drug use.  Avoid taking birth control pills. Talk to your health care provider about the risks of taking birth control pills if: ? You are over 72 years old. ? You smoke. ? You get migraines. ? You have ever had a blood clot. What other changes can be made?  Manage your cholesterol levels. ? Eating a healthy diet is important for preventing high cholesterol. If cholesterol cannot be managed through diet alone, you may also need  to take medicines. ? Take any prescribed medicines to control your cholesterol as told by your health care provider.  Manage your diabetes. ? Eating a healthy diet and exercising regularly are important parts of managing your blood sugar. If your blood sugar cannot be managed through diet and exercise, you may need to take  medicines. ? Take any prescribed medicines to control your diabetes as told by your health care provider.  Control your hypertension. ? To reduce your risk of stroke, try to keep your blood pressure below 130/80. ? Eating a healthy diet and exercising regularly are an important part of controlling your blood pressure. If your blood pressure cannot be managed through diet and exercise, you may need to take medicines. ? Take any prescribed medicines to control hypertension as told by your health care provider. ? Ask your health care provider if you should monitor your blood pressure at home. ? Have your blood pressure checked every year, even if your blood pressure is normal. Blood pressure increases with age and some medical conditions.  Get evaluated for sleep disorders (sleep apnea). Talk to your health care provider about getting a sleep evaluation if you snore a lot or have excessive sleepiness.  Take over-the-counter and prescription medicines only as told by your health care provider. Aspirin or blood thinners (antiplatelets or anticoagulants) may be recommended to reduce your risk of forming blood clots that can lead to stroke.  Make sure that any other medical conditions you have, such as atrial fibrillation or atherosclerosis, are managed. What are the warning signs of a stroke? The warning signs of a stroke can be easily remembered as BEFAST.  B is for balance. Signs include: ? Dizziness. ? Loss of balance or coordination. ? Sudden trouble walking.  E is for eyes. Signs include: ? A sudden change in vision. ? Trouble seeing.  F is for face. Signs include: ? Sudden weakness or numbness of the face. ? The face or eyelid drooping to one side.  A is for arms. Signs include: ? Sudden weakness or numbness of the arm, usually on one side of the body.  S is for speech. Signs include: ? Trouble speaking (aphasia). ? Trouble understanding.  T is for time. ? These symptoms may  represent a serious problem that is an emergency. Do not wait to see if the symptoms will go away. Get medical help right away. Call your local emergency services (911 in the [place] Do not drive yourself to the hospital.  Other signs of stroke may include: ? A sudden, severe headache with no known cause. ? Nausea or vomiting. ? Seizure. Where to find more information For more information, visit:  American Stroke Association: www.strokeassociation.org  National Stroke Association: www.stroke.org Summary  You can prevent a stroke by eating healthy, exercising, not smoking, limiting alcohol intake, and managing any medical conditions you may have.  Do not use any products that contain nicotine or tobacco, such as cigarettes and e-cigarettes. If you need help quitting, ask your health care provider. It may also be helpful to avoid exposure to secondhand smoke.  Remember BEFAST for warning signs of stroke. Get help right away if you or a loved one has any of these signs. This information is not intended to replace advice given to you by your health care provider. Make sure you discuss any questions you have with your health care provider. Document Revised: 03/18/2017 Document Reviewed: 05/11/2016 Elsevier Patient Education  2020 [employer].

## 2020-02-06 NOTE — Progress Notes (Signed)
[facility] [address] [place]. [place] [address] [phone]       OFFICE CONSULT NOTE  Mr. JOSEMARIA BRINING Date of Birth:  December 15, 1950 Medical Record Number:  [id number] Take care: Referring MD: [name]  Reason for Referral: Stroke  HPI: Mr. Lauver is a pleasant 69 year old Caucasian male with past medical history of hypertension, hyper lipidemia, diabetes, congestive heart failure and coronary artery disease who developed sudden onset of right lower face and hand numbness as well as imbalance and difficulty walking on 12/03/2019.  He did not seek medical help the same day as if waited for it to get better.  Next day he went to the hospital but got tired waiting in the ER since he was not seen and instead went to see his primary physician Dr. [name] who ordered an outpatient stroke work-up.  MRI scan of the brain done on 12/20/2019 shows a subacute left thalamic lacunar infarct.  Carotid ultrasound on 12/10/2019 showed no significant extracranial stenosis.  Echocardiogram on 12/28/2018 showed slightly diminished ejection fraction of 45 to 50% but no clot or definite cardiac source of embolism was noted.  He underwent 30 days of outpatient telemetry cardiac monitoring which was negative for atrial fibrillation or significant arrhythmias.  Carotid ultrasound on 12/10/2019 was unremarkable.  LDL cholesterol was 78 mg percent on 12/26/2019.  Patient is presently on Plavix 75 mg daily which is tolerating well without bruising or bleeding.  He states his gait and balance have improved and he has finished outpatient physical and occupational therapy.  He still stumbles occasionally but as long to walk slowly and carefully.  He has had no falls or injuries.  He however still has persistent paresthesias in his right face and right hand which are bothersome and he would like to try some medications for this.  He has had no recurrent stroke or TIA symptoms.  States his blood pressure is  well controlled and today it is 113/65 in office.  States his sugars are also doing well however he cannot tell me when his last hemoglobin A1c was checked.  He has been scheduled to undergo loop recorder pending my consultation visit today.  ROS:   14 system review of systems is positive for numbness, tingling, decreased hearing, poor balance, gait difficulty and all other systems negative  PMH:  Past Medical History:  Diagnosis Date  . Anxiety   . CAD    a. acute inferior lateral wall infarction in September 2004 treated medically. b.  cath 06/17/16 showing mild nonobstructive CAD with 30-40% ostial LM (eccentric), elevated LV filling pressures and normal LV function.  . Chronic diastolic CHF (congestive heart failure) ([place])   . CKD (chronic kidney disease), stage II   . COPD (chronic obstructive pulmonary disease) ([place])   . Diabetes mellitus   . Diverticulosis   . DJD (degenerative joint disease)   . GERD (gastroesophageal reflux disease)   . HTN (hypertension)   . Hyperlipidemia   . Low back pain syndrome   . Myocardial infarction ([place]) 2004  . Obesity   . OSA (obstructive sleep apnea)   . RBBB   . Recurrent aspiration bronchitis/pneumonia   . Recurrent aspiration pneumonia ([place])    [name] 07/13/2016  . Thrombocytopenia ([place])   . Tubular adenoma of colon 2014    Social History:  Social History   Socioeconomic History  . Marital status: Married    Spouse name: Not on file  . Number of children: 1  .  Years of education: Not on file  . Highest education level: Not on file  Occupational History  . Occupation: Probation officer of [employer]  Tobacco Use  . Smoking status: Former Smoker    Packs/day: 1.00    Years: 20.00    Pack years: 20.00    Types: Cigarettes, Cigars    Quit date: 05/08/1998    Years since quitting: 21.7  . Smokeless tobacco: Never Used  . Tobacco comment: quit in 2005  Vaping Use  . Vaping Use: Never used  Substance and Sexual Activity  .  Alcohol use: Yes    Alcohol/week: 1.0 standard drink    Types: 1 Standard drinks or equivalent per week    Comment: social drinker  . Drug use: No  . Sexual activity: Yes  Other Topics Concern  . Not on file  Social History Narrative   Lives with spouse only   Right Handed   Drinks <1 cup caffeine daily   Social Determinants of Health   Financial Resource Strain:   . Difficulty of Paying Living Expenses: Not on file  Food Insecurity:   . Worried About Charity fundraiser in the Last Year: Not on file  . Ran Out of Food in the Last Year: Not on file  Transportation Needs:   . Lack of Transportation (Medical): Not on file  . Lack of Transportation (Non-Medical): Not on file  Physical Activity:   . Days of Exercise per Week: Not on file  . Minutes of Exercise per Session: Not on file  Stress:   . Feeling of Stress : Not on file  Social Connections:   . Frequency of Communication with Friends and Family: Not on file  . Frequency of Social Gatherings with Friends and Family: Not on file  . Attends Religious Services: Not on file  . Active Member of Clubs or Organizations: Not on file  . Attends Archivist Meetings: Not on file  . Marital Status: Not on file  Intimate Partner Violence:   . Fear of Current or Ex-Partner: Not on file  . Emotionally Abused: Not on file  . Physically Abused: Not on file  . Sexually Abused: Not on file    Medications:   Current Outpatient Medications on File Prior to Visit  Medication Sig Dispense Refill  . albuterol (PROAIR HFA) 108 (90 Base) MCG/ACT inhaler Inhale 1-2 puffs into the lungs every 6 (six) hours as needed for wheezing or shortness of breath. 18 g 2  . albuterol (PROVENTIL) (2.5 MG/3ML) 0.083% nebulizer solution Use one vial in the nebulizer every 4-6 hours if needed for cough or wheeze 225 mL 11  . ALPRAZolam (XANAX) 0.5 MG tablet Take 1 tablet (0.5 mg total) by mouth 3 (three) times daily as needed. 90 tablet 1  .  ammonium lactate (LAC-HYDRIN) 12 % lotion APPLY TOPICALLY AS DIRECTED. 400 mL 3  . budesonide-formoterol (SYMBICORT) 160-4.5 MCG/ACT inhaler INHALE 2 PUFFS INTO THE LUNGS 2 (TWO) TIMES DAILY. 3 Inhaler 0  . buPROPion (WELLBUTRIN SR) 150 MG 12 hr tablet Take 1 tablet (150 mg total) by mouth 2 (two) times daily. 180 tablet 0  . carvedilol (COREG) 25 MG tablet TAKE 1 TAB BY MOUTH 2 TIMES DAILY WITH A MEAL.ANNUAL APPT IN FEBUARY MUST SEE PROVIDER FOR REFILLS 180 tablet 3  . cetirizine (ZYRTEC) 10 MG tablet Take 1 tablet (10 mg total) by mouth daily. 30 tablet 11  . Cholecalciferol (VITAMIN D3) 5000 UNITS CAPS Take one tablet by  mouth daily    . clopidogrel (PLAVIX) 75 MG tablet Take 1 tablet (75 mg total) by mouth daily. 90 tablet 0  . clotrimazole-betamethasone (LOTRISONE) cream APPLY AS DIRECTED 45 g 11  . Coenzyme Q10 (COQ10) 100 MG CAPS Take as directed 30 each 11  . metFORMIN (GLUCOPHAGE) 500 MG tablet TAKE 1 TABLET BY MOUTH EVERY DAY WITH BREAKFAST 90 tablet 3  . montelukast (SINGULAIR) 10 MG tablet TAKE 1 TABLET BY MOUTH EVERYDAY AT BEDTIME 90 tablet 3  . nitroGLYCERIN (NITROSTAT) 0.4 MG SL tablet PLACE 1 TABLET UNDER THE TONGUE EVERY 5 MINUTES AS NEEDED. FOR UP TO 3 DOSES. 25 tablet 2  . pravastatin (PRAVACHOL) 40 MG tablet Take 1 tablet (40 mg total) by mouth daily. 90 tablet 3  . sildenafil (VIAGRA) 100 MG tablet TAKE 1 TABLET BY MOUTH AS NEEDED FOR ERECTILE DYSFUNCTION. 30 tablet 3  . vitamin C (ASCORBIC ACID) 500 MG tablet Take 500 mg by mouth daily.     Marland Kitchen ezetimibe (ZETIA) 10 MG tablet Take 1 tablet (10 mg total) by mouth daily. 90 tablet 3  . hydrALAZINE (APRESOLINE) 50 MG tablet Take 1 tablet (50 mg total) by mouth 3 (three) times daily. 270 tablet 3   No current facility-administered medications on file prior to visit.    Allergies:   Allergies  Allergen Reactions  . Ace Inhibitors Swelling  . Angiotensin Receptor Blockers Swelling  . Aspirin Hives  . Amlodipine     Leg  swelling  . Crestor [Rosuvastatin Calcium]     myalgias  . Lipitor [Atorvastatin Calcium]     arthralgias  . Codeine Nausea And Vomiting  . Irbesartan Other (See Comments)    REACTION: allergic to ARBs w/ angioedema  . Ramipril Other (See Comments)    REACTION: Allergic to ACE's w/ angioedema...    Physical Exam General: well developed, well nourished middle-aged Caucasian male, seated, in no evident distress Head: head normocephalic and atraumatic.   Neck: supple with no carotid or supraclavicular bruits Cardiovascular: regular rate and rhythm, no murmurs Musculoskeletal: no deformity Skin:  no rash/petichiae Vascular:  Normal pulses all extremities  Neurologic Exam Mental Status: Awake and fully alert. Oriented to place and time. Recent and remote memory intact. Attention span, concentration and fund of knowledge appropriate. Mood and affect appropriate.  Cranial Nerves: Fundoscopic exam reveals sharp disc margins. Pupils equal, briskly reactive to light. Extraocular movements full without nystagmus. Visual fields full to confrontation. Hearing intact. Facial sensation intact. Face, tongue, palate moves normally and symmetrically.  Motor: Normal bulk and tone. Normal strength in all tested extremity muscles. Sensory.: intact to touch , pinprick , position and vibratory sensation but paresthesias in the right face and hand..  Coordination: Rapid alternating movements normal in all extremities. Finger-to-nose and heel-to-shin performed accurately bilaterally. Gait and Station: Arises from chair without difficulty. Stance is normal. Gait demonstrates normal stride length and balance . Able to heel, toe and tandem walk with moderate  difficulty.  Reflexes: 1+ and symmetric. Toes downgoing.   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   Vascular risk factors of hypertension hyperlipidemia coronary artery disease.     PLAN: I had a long d/w patient and his wife about his recent thalamic stroke and post stroke paresthesias,, risk for recurrent stroke/TIAs, personally independently reviewed imaging studies and stroke evaluation results and answered  questions.Continue Plavix 75 mg daily for secondary stroke prevention and maintain strict control of hypertension with blood pressure goal below 130/90, diabetes with hemoglobin A1c goal below 6.5% and lipids with LDL cholesterol goal below 70 mg/dL. I also advised the patient to eat a healthy diet with plenty of whole grains, cereals, fruits and vegetables, exercise regularly and maintain ideal body weight .add Topamax 25 mg twice daily to help with his post stroke paresthesias.  I have discussed side effects with the patient and wife advised him to call me if needed.  I do not think he needs a loop recorder as his stroke was thalamic and due to small vessel disease and low possibility of atrial fibrillation.  Greater than 50% time during this 45-minute consultation visit was spent on counseling and coordination of care about his lacunar stroke and discussion about stroke prevention and treatment and answering questions.  Followup in the future with my nurse practitioner Janett Billow in 3 months or call earlier if necessary. Antony Contras, MD Note: This document was prepared with digital dictation and possible smart phrase technology. Any transcriptional errors that result from this process are unintentional.

## 2020-02-07 ENCOUNTER — Telehealth: Payer: Self-pay | Admitting: Adult Health

## 2020-02-07 ENCOUNTER — Telehealth: Payer: Self-pay | Admitting: Internal Medicine

## 2020-02-07 ENCOUNTER — Encounter: Payer: Medicare Other | Admitting: Occupational Therapy

## 2020-02-07 MED ORDER — TOPIRAMATE 25 MG PO TABS: 25.0000 mg | ORAL_TABLET | Freq: Two times a day (BID) | ORAL | 0 refills | Status: DC

## 2020-02-07 NOTE — Telephone Encounter (Signed)
Rx sent for 90-day supply. Pt called and notified.

## 2020-02-07 NOTE — Telephone Encounter (Signed)
Pt called, topiramate (TOPAMAX) 25 MG tablet cost the same for a 90-day supply. Can you change my prescription to a 90-day supply. Would like a call from the nurse.

## 2020-02-07 NOTE — Telephone Encounter (Signed)
buPROPion [facility] SR) 150 MG 12 hr tablet [id number] [place], [place] - [address] AT [place] Phone:  [phone]  Fax:  [phone]     Patient requesting a refill for this medication.

## 2020-02-08 MED ORDER — BUPROPION HCL ER (SR) 150 MG PO TB12
150.0000 mg | ORAL_TABLET | Freq: Two times a day (BID) | ORAL | 3 refills | Status: DC
Start: 2020-02-08 — End: 2021-04-21

## 2020-02-08 NOTE — Telephone Encounter (Signed)
Done. Thanks.

## 2020-02-12 ENCOUNTER — Ambulatory Visit: Payer: Medicare Other | Admitting: Occupational Therapy

## 2020-02-14 ENCOUNTER — Encounter: Payer: Medicare Other | Admitting: Occupational Therapy

## 2020-02-18 ENCOUNTER — Other Ambulatory Visit: Payer: Self-pay | Admitting: Neurology

## 2020-02-18 MED ORDER — PREGABALIN 50 MG PO CAPS: 50.0000 mg | ORAL_CAPSULE | Freq: Two times a day (BID) | ORAL | 2 refills | Status: DC

## 2020-02-18 NOTE — Telephone Encounter (Signed)
Agree to stop Topamax as he is not tolerating it.  I will call in Lyrica 50 mg twice daily instead.

## 2020-02-18 NOTE — Telephone Encounter (Signed)
lmvm for pt that returned call.

## 2020-02-18 NOTE — Telephone Encounter (Signed)
Spoke to pt.  He stated taking topiramate 25mg  po bid for the first 5 days was ok, then after that hit him for a loop, walk like drunk, felt like out of his mind.  Did not notice any difference with paresthesias.  Stopped Thursday and felt back to baseline Friday.  Try something else.

## 2020-02-18 NOTE — Telephone Encounter (Signed)
Pt is asking for a call from [place] to discuss the side effects he is having from topiramate (TOPAMAX) 25 MG tablet

## 2020-02-19 ENCOUNTER — Encounter: Payer: Medicare Other | Admitting: Occupational Therapy

## 2020-02-19 ENCOUNTER — Institutional Professional Consult (permissible substitution): Payer: Medicare Other | Admitting: Cardiology

## 2020-02-19 NOTE — Telephone Encounter (Addendum)
I could not find prescription.  I called pharmacy and they did not have called in prescription. I called pt and relayed per [name], that he ordered pregabalin (LYRICA) 50mg  po bid #60 refill x 1. (pt ok to do 30 day supply as had problems with last med SE).  I  Called in [facility] to [facility], [facility]ist [facility].  Went over common [facility].  He appreciated called.

## 2020-02-21 ENCOUNTER — Encounter: Payer: Medicare Other | Admitting: Occupational Therapy

## 2020-02-28 ENCOUNTER — Ambulatory Visit: Payer: Medicare Other | Admitting: Internal Medicine

## 2020-02-28 ENCOUNTER — Ambulatory Visit (INDEPENDENT_AMBULATORY_CARE_PROVIDER_SITE_OTHER): Payer: Medicare Other

## 2020-02-28 ENCOUNTER — Other Ambulatory Visit: Payer: Self-pay

## 2020-02-28 ENCOUNTER — Encounter: Payer: Self-pay | Admitting: Internal Medicine

## 2020-02-28 VITALS — BP 120/70 | HR 64 | Temp 98.3°F | Ht 69.0 in | Wt 187.4 lb

## 2020-02-28 DIAGNOSIS — Z0000 Encounter for general adult medical examination without abnormal findings: Secondary | ICD-10-CM

## 2020-02-28 DIAGNOSIS — R739 Hyperglycemia, unspecified: Secondary | ICD-10-CM

## 2020-02-28 DIAGNOSIS — Z8673 Personal history of transient ischemic attack (TIA), and cerebral infarction without residual deficits: Secondary | ICD-10-CM | POA: Diagnosis not present

## 2020-02-28 DIAGNOSIS — I10 Essential (primary) hypertension: Secondary | ICD-10-CM | POA: Diagnosis not present

## 2020-02-28 DIAGNOSIS — M542 Cervicalgia: Secondary | ICD-10-CM

## 2020-02-28 DIAGNOSIS — N183 Chronic kidney disease, stage 3 unspecified: Secondary | ICD-10-CM | POA: Diagnosis not present

## 2020-02-28 DIAGNOSIS — I6381 Other cerebral infarction due to occlusion or stenosis of small artery: Secondary | ICD-10-CM

## 2020-02-28 DIAGNOSIS — I639 Cerebral infarction, unspecified: Secondary | ICD-10-CM

## 2020-02-28 LAB — BASIC METABOLIC PANEL
BUN: 21 mg/dL (ref 6–23)
CO2: 30 mEq/L (ref 19–32)
Calcium: 9.3 mg/dL (ref 8.4–10.5)
Chloride: 102 mEq/L (ref 96–112)
Creatinine, Ser: 1.29 mg/dL (ref 0.40–1.50)
GFR: 56.47 mL/min — ABNORMAL LOW (ref >60.00)
Glucose, Bld: 74 mg/dL (ref 70–99)
Potassium: 3.8 mEq/L (ref 3.5–5.1)
Sodium: 140 mEq/L (ref 135–145)

## 2020-02-28 LAB — HEMOGLOBIN A1C: Hgb A1c MFr Bld: 5.6 % (ref 4.6–6.5)

## 2020-02-28 NOTE — Progress Notes (Addendum)
Subjective:   Larry Stone is a 69 y.o. male who presents for Medicare Annual/Subsequent preventive examination.  Review of Systems    No ROS. Medicare Wellness Visit. Additional risk factors are reflected in social history. Cardiac Risk Factors include: advanced age (>67men, >76 women);dyslipidemia;family history of premature cardiovascular disease;hypertension;male gender Sleep Patterns: No sleep issues, feels rested on waking and sleeps 6-8 hours nightly. Home Safety/Smoke Alarms: Feels safe in home; uses home alarm. Smoke alarms in place. Living environment: 2-story home; Lives with spouse; uses a cane for DME; good support system. Seat Belt Safety/Bike Helmet: Wears seat belt.    Objective:    Today's Vitals   02/28/20 1513  BP: 120/70  Pulse: 64  Temp: 98.3 F (36.8 C)  TempSrc: Temporal  SpO2: 98%  Weight: 187 lb 6.4 oz (85 kg)  Height: 5\' 9"  (1.753 m)  PainSc: 7   PainLoc: Hand   Body mass index is 27.67 kg/m.  Advanced Directives 02/28/2020 12/07/2019 12/04/2019 12/06/2018 10/04/2017 07/13/2016 07/13/2016  Does Patient Have a Medical Advance Directive? Yes No No Yes Yes - Yes  Type of Advance Directive Living will;Healthcare Power of [place];Living will - [place];Living will [place];Living will  Does patient want to make changes to medical advance directive? No - Patient declined - - - - - No - Patient declined  Copy of [place] in Chart? No - copy requested - - No - copy requested - No - copy requested -  Would patient like information on creating a medical advance directive? - No - Patient declined No - Patient declined - - - -    Current Medications (verified) Outpatient Encounter Medications as of 02/28/2020  Medication Sig   albuterol (PROAIR HFA) 108 (90 Base) MCG/ACT inhaler Inhale 1-2 puffs into the lungs every 6 (six) hours as needed for wheezing or shortness  of breath.   albuterol (PROVENTIL) (2.5 MG/3ML) 0.083% nebulizer solution Use one vial in the nebulizer every 4-6 hours if needed for cough or wheeze   ALPRAZolam (XANAX) 0.5 MG tablet Take 1 tablet (0.5 mg total) by mouth 3 (three) times daily as needed.   ammonium lactate (LAC-HYDRIN) 12 % lotion APPLY TOPICALLY AS DIRECTED.   budesonide-formoterol (SYMBICORT) 160-4.5 MCG/ACT inhaler INHALE 2 PUFFS INTO THE LUNGS 2 (TWO) TIMES DAILY.   buPROPion (WELLBUTRIN SR) 150 MG 12 hr tablet Take 1 tablet (150 mg total) by mouth 2 (two) times daily.   carvedilol (COREG) 25 MG tablet TAKE 1 TAB BY MOUTH 2 TIMES DAILY WITH A MEAL.ANNUAL APPT IN FEBUARY MUST SEE PROVIDER FOR REFILLS   cetirizine (ZYRTEC) 10 MG tablet Take 1 tablet (10 mg total) by mouth daily.   Cholecalciferol (VITAMIN D3) 5000 UNITS CAPS Take one tablet by mouth daily   clopidogrel (PLAVIX) 75 MG tablet TAKE 1 TABLET BY MOUTH EVERY DAY   clotrimazole-betamethasone (LOTRISONE) cream APPLY AS DIRECTED   Coenzyme Q10 (COQ10) 100 MG CAPS Take as directed   ezetimibe (ZETIA) 10 MG tablet TAKE 1 TABLET BY MOUTH EVERY DAY   hydrALAZINE (APRESOLINE) 50 MG tablet TAKE 1 TABLET BY MOUTH THREE TIMES A DAY   metFORMIN (GLUCOPHAGE) 500 MG tablet TAKE 1 TABLET BY MOUTH EVERY DAY WITH BREAKFAST   montelukast (SINGULAIR) 10 MG tablet TAKE 1 TABLET BY MOUTH EVERYDAY AT BEDTIME   nitroGLYCERIN (NITROSTAT) 0.4 MG SL tablet PLACE 1 TABLET UNDER THE TONGUE EVERY 5 MINUTES AS NEEDED.  FOR UP TO 3 DOSES.   pravastatin (PRAVACHOL) 40 MG tablet TAKE 1 TABLET BY MOUTH EVERY DAY   pregabalin (LYRICA) 50 MG capsule Take 1 capsule (50 mg total) by mouth 2 (two) times daily.   sildenafil (VIAGRA) 100 MG tablet TAKE 1 TABLET BY MOUTH AS NEEDED FOR ERECTILE DYSFUNCTION.   vitamin C (ASCORBIC ACID) 500 MG tablet Take 500 mg by mouth daily.    No facility-administered encounter medications on file as of 02/28/2020.    Allergies (verified) Ace inhibitors, Angiotensin  receptor blockers, Aspirin, Amlodipine, Crestor [rosuvastatin calcium], Lipitor [atorvastatin calcium], Codeine, Irbesartan, and Ramipril   History: Past Medical History:  Diagnosis Date   Anxiety    CAD    a. acute inferior lateral wall infarction in September 2004 treated medically. b.  cath 06/17/16 showing mild nonobstructive CAD with 30-40% ostial LM (eccentric), elevated LV filling pressures and normal LV function.   Chronic diastolic CHF (congestive heart failure) (HCC)    CKD (chronic kidney disease), stage II    COPD (chronic obstructive pulmonary disease) (HCC)    Diabetes mellitus    Diverticulosis    DJD (degenerative joint disease)    GERD (gastroesophageal reflux disease)    HTN (hypertension)    Hyperlipidemia    Low back pain syndrome    Myocardial infarction ([place]) 2004   Obesity    OSA (obstructive sleep apnea)    RBBB    Recurrent aspiration bronchitis/pneumonia    Recurrent aspiration pneumonia ([place])    [name] 07/13/2016   Thrombocytopenia (HCC)    Tubular adenoma of colon 2014   Past Surgical History:  Procedure Laterality Date   CARDIAC CATHETERIZATION     LEFT HEART CATH AND CORONARY ANGIOGRAPHY N/A 06/17/2016   Procedure: Left Heart Cath and Coronary Angiography;  Surgeon: Burnell Blanks, MD;  Location: [facility];  Service: Cardiovascular;  Laterality: N/A;   UVULOPALATOPHARYNGOPLASTY     surgery for OSA   Family History  Problem Relation Age of Onset   Colon cancer Father    Stroke Mother    Heart disease Mother    Hypertension Sister    Diabetes Sister    Colon cancer Paternal Uncle    Social History   Socioeconomic History   Marital status: Married    Spouse name: Not on file   Number of children: 1   Years of education: Not on file   Highest education level: Not on file  Occupational History   Occupation: Probation officer of [employer]  Tobacco Use   Smoking status: Former Smoker    Packs/day: 1.00    Years: 20.00      Pack years: 20.00    Types: Cigarettes, Cigars    Quit date: 05/08/1998    Years since quitting: 21.8   Smokeless tobacco: Never Used   Tobacco comment: quit in 2005  Vaping Use   Vaping Use: Never used  Substance and Sexual Activity   Alcohol use: Yes    Alcohol/week: 1.0 standard drink    Types: 1 Standard drinks or equivalent per week    Comment: social drinker   Drug use: No   Sexual activity: Yes  Other Topics Concern   Not on file  Social History Narrative   Lives with spouse only   Right Handed   Drinks <1 cup caffeine daily   Social Determinants of Health   Financial Resource Strain: Low Risk    Difficulty of Paying Living Expenses: Not hard at all  Food Insecurity: No Food Insecurity   Worried About Charity fundraiser in the Last Year: Never true   Ran Out of Food in the Last Year: Never true  Transportation Needs: No Transportation Needs   Lack of Transportation (Medical): No   Lack of Transportation (Non-Medical): No  Physical Activity: Inactive   Days of Exercise per Week: 0 days   Minutes of Exercise per Session: 0 min  Stress: No Stress Concern Present   Feeling of Stress : Not at all  Social Connections: Unknown   Frequency of Communication with Friends and Family: More than three times a week   Frequency of Social Gatherings with Friends and Family: More than three times a week   Attends Religious Services: Patient refused   Marine scientist or Organizations: Patient refused   Attends Archivist Meetings: Patient refused   Marital Status: Patient refused    Tobacco Counseling Counseling given: Not Answered Comment: quit in 2005   Clinical Intake:  Pre-visit preparation completed: Yes  Pain : 0-10 Pain Score: 7  Pain Type: Acute pain Pain Location: Hand Pain Orientation: Right Pain Radiating Towards: n/a Pain Descriptors / Indicators: Discomfort, Throbbing Pain Onset: More than a month ago Pain Frequency: Constant Pain  Relieving Factors: [place] Effect of Pain on Daily Activities: Pain can diminish job performance, lower motivation to exercise, and prevent you from completing daily tasks.  Pain Relieving Factors: [place]  BMI - recorded: 27.67 Nutritional Status: BMI 25 -29 Overweight Nutritional Risks: None Diabetes: No  How often do you need to have someone help you when you read instructions, pamphlets, or other written materials from your doctor or pharmacy?: 1 - Never What is the last grade level you completed in school?: 2 years of college  Diabetic? no  Interpreter Needed?: No  Information entered by :: Lisette Abu, LPN   Activities of Daily Living In your present state of health, do you have any difficulty performing the following activities: 02/28/2020 12/26/2019  Hearing? Y N  Comment wears hearing aids -  Vision? N N  Difficulty concentrating or making decisions? N N  Walking or climbing stairs? N Y  Dressing or bathing? N N  Doing errands, shopping? N N  Preparing Food and eating ? N -  Using the Toilet? N -  In the past six months, have you accidently leaked urine? N -  Do you have problems with loss of bowel control? N -  Managing your Medications? N -  Managing your Finances? N -  Housekeeping or managing your Housekeeping? N -  Some recent data might be hidden    Patient Care Team: Plotnikov, Evie Lacks, MD as PCP - General (Internal Medicine) Burnell Blanks, MD as PCP - Cardiology (Cardiology) Burnell Blanks, MD as Consulting Physician (Cardiology) Loletha Carrow Kirke Corin, MD as Consulting Physician (Gastroenterology)  Indicate any recent Medical Services you may have received from other than [facility] providers in the past year (date may be approximate).     Assessment:   This is a routine wellness examination for Jayshawn.  Hearing/Vision screen No exam data present  Dietary issues and exercise activities discussed: Current Exercise Habits: The patient  does not participate in regular exercise at present, Exercise limited by: psychological condition(s);cardiac condition(s);respiratory conditions(s)  Goals      Patient Stated       Depression Screen [facility] 2/9 Scores 02/28/2020 12/26/2019 12/06/2018 12/22/2016 03/30/2016 11/14/2012  PHQ - 2 Score 2 0 2 1  0 0  PHQ- 9 Score - - 3 1 - -    Fall Risk Fall Risk  02/28/2020 12/26/2019 12/06/2018 07/12/2017 03/30/2016  Falls in the past year? 0 0 0 No No  Number falls in past yr: 0 0 0 - -  Injury with Fall? 0 0 0 - -  Risk for fall due to : No Fall Risks - - - -  Follow up Falls evaluation completed - - - -    Any stairs in or around the home? Yes  If so, are there any without handrails? No  Home free of loose throw rugs in walkways, pet beds, electrical cords, etc? Yes  Adequate lighting in your home to reduce risk of falls? Yes   ASSISTIVE DEVICES UTILIZED TO PREVENT FALLS:  Life alert? No  Use of a cane, walker or w/c? Yes  Grab bars in the bathroom? Yes  Shower chair or bench in shower? Yes  Elevated toilet seat or a handicapped toilet? Yes   TIMED UP AND GO:  Was the test performed? No .  Length of time to ambulate 10 feet: 0 sec.   Gait steady and fast with assistive device  Cognitive Function:     6CIT Screen 02/28/2020  What Year? 0 points  What month? 0 points  What time? 0 points  Count back from 20 0 points  Months in reverse 0 points  Repeat phrase 0 points  Total Score 0    Immunizations Immunization History  Administered Date(s) Administered   Fluad Quad(high Dose 65+) 12/11/2018   Influenza Split 02/02/2011, 03/03/2012   Influenza Whole 02/02/2010   Influenza, High Dose Seasonal PF 02/20/2013, 01/16/2015, 12/16/2015, 12/22/2016, 01/04/2018   Influenza, Seasonal, Injecte, Preservative Fre 02/21/2014   Moderna SARS-COVID-2 Vaccination 05/24/2019, 06/22/2019, 01/15/2020   Pneumococcal Conjugate-13 03/05/2014, 12/26/2019   Pneumococcal Polysaccharide-23  06/18/2010, 09/03/2015   Tdap 11/14/2012   Zoster 09/11/2012   Zoster Recombinat (Shingrix) 12/22/2016, 03/14/2017    TDAP status: Up to date Flu Vaccine status: Up to date Pneumococcal vaccine status: Up to date Covid-19 vaccine status: Completed vaccines  Qualifies for Shingles Vaccine? Yes   Zostavax completed Yes   Shingrix Completed?: Yes  Screening Tests Health Maintenance  Topic Date Due   COLONOSCOPY  10/04/2020   TETANUS/TDAP  11/15/2022   INFLUENZA VACCINE  Completed   COVID-19 Vaccine  Completed   Hepatitis C Screening  Completed   PNA vac Low Risk Adult  Completed    Health Maintenance  There are no preventive care reminders to display for this patient.  Colorectal cancer screening: Completed 10/04/2017. Repeat every 3 years  Lung Cancer Screening: (Low Dose CT Chest recommended if Age 77-80 years, 30 pack-year currently smoking OR have quit w/in 15years.) does qualify.   Lung Cancer Screening Referral: no  Additional Screening:  Hepatitis C Screening: does qualify; Completed yes  Vision Screening: Recommended annual ophthalmology exams for early detection of glaucoma and other disorders of the eye. Is the patient up to date with their annual eye exam?  Yes  Who is the provider or what is the name of the office in which the patient attends annual eye exams? Patient stated that he had eye exam for this year. If pt is not established with a provider, would they like to be referred to a provider to establish care? No .   Dental Screening: Recommended annual dental exams for proper oral hygiene  Community Resource Referral / Chronic Care Management: CRR required this visit?  No   CCM required this visit?  No      Plan:     I have personally reviewed and noted the following in the patient's chart:   Medical and social history Use of alcohol, tobacco or illicit drugs  Current medications and supplements Functional ability and status Nutritional  status Physical activity Advanced directives List of other physicians Hospitalizations, surgeries, and ER visits in previous 12 months Vitals Screenings to include cognitive, depression, and falls Referrals and appointments  In addition, I have reviewed and discussed with patient certain preventive protocols, quality metrics, and best practice recommendations. A written personalized care plan for preventive services as well as general preventive health recommendations were provided to patient.     Sheral Flow, LPN   [id number]   Nurse Notes: n/a  Medical screening examination/treatment/procedure(s) were performed by non-physician practitioner and as supervising physician I was immediately available for consultation/collaboration.  I agree with above. Lew Dawes, MD

## 2020-02-28 NOTE — Patient Instructions (Signed)
Larry Stone , Thank you for taking time to come for your Medicare Wellness Visit. I appreciate your ongoing commitment to your health goals. Please review the following plan we discussed and let me know if I can assist you in the future.   Screening recommendations/referrals: Colonoscopy: 10/04/2017; due every 3 years Recommended yearly ophthalmology/optometry visit for glaucoma screening and checkup Recommended yearly dental visit for hygiene and checkup  Vaccinations: Influenza vaccine: 01/15/2020 Pneumococcal vaccine: up to date Tdap vaccine: 11/14/2012; due every 10 years Shingles vaccine: up to date   Covid-19: up to date with booster  Advanced directives: Please bring a copy of your health care power of attorney and living will to the office at your convenience.  Conditions/risks identified: Yes; Reviewed health maintenance screenings with patient today and relevant education, vaccines, and/or referrals were provided. Please continue to do your personal lifestyle choices by: daily care of teeth and gums, regular physical activity (goal should be 5 days a week for 30 minutes), eat a healthy diet, avoid tobacco and drug use, limiting any alcohol intake, taking a low-dose aspirin (if not allergic or have been advised by your provider otherwise) and taking vitamins and minerals as recommended by your provider. Continue doing brain stimulating activities (puzzles, reading, adult coloring books, staying active) to keep memory sharp. Continue to eat heart healthy diet (full of fruits, vegetables, whole grains, lean protein, water--limit salt, fat, and sugar intake) and increase physical activity as tolerated.  Next appointment: Please schedule your next Medicare Wellness Visit with your Nurse Health Advisor in 1 year by calling [phone].  Preventive Care 65 Years and Older, Male Preventive care refers to lifestyle choices and visits with your health care provider that can promote health and  wellness. What does preventive care include?  A yearly physical exam. This is also called an annual well check.  Dental exams once or twice a year.  Routine eye exams. Ask your health care provider how often you should have your eyes checked.  Personal lifestyle choices, including:  Daily care of your teeth and gums.  Regular physical activity.  Eating a healthy diet.  Avoiding tobacco and drug use.  Limiting alcohol use.  Practicing safe sex.  Taking low doses of aspirin every day.  Taking vitamin and mineral supplements as recommended by your health care provider. What happens during an annual well check? The services and screenings done by your health care provider during your annual well check will depend on your age, overall health, lifestyle risk factors, and family history of disease. Counseling  Your health care provider may ask you questions about your:  Alcohol use.  Tobacco use.  Drug use.  Emotional well-being.  Home and relationship well-being.  Sexual activity.  Eating habits.  History of falls.  Memory and ability to understand (cognition).  Work and work Statistician. Screening  You may have the following tests or measurements:  Height, weight, and BMI.  Blood pressure.  Lipid and cholesterol levels. These may be checked every 5 years, or more frequently if you are over 77 years old.  Skin check.  Lung cancer screening. You may have this screening every year starting at age 17 if you have a 30-pack-year history of smoking and currently smoke or have quit within the past 15 years.  Fecal occult blood test (FOBT) of the stool. You may have this test every year starting at age 55.  Flexible sigmoidoscopy or colonoscopy. You may have a sigmoidoscopy every 5 years or a colonoscopy  every 10 years starting at age 78.  Prostate cancer screening. Recommendations will vary depending on your family history and other risks.  Hepatitis C blood  test.  Hepatitis B blood test.  Sexually transmitted disease (STD) testing.  Diabetes screening. This is done by checking your blood sugar (glucose) after you have not eaten for a while (fasting). You may have this done every 1-3 years.  Abdominal aortic aneurysm (AAA) screening. You may need this if you are a current or former smoker.  Osteoporosis. You may be screened starting at age 9 if you are at high risk. Talk with your health care provider about your test results, treatment options, and if necessary, the need for more tests. Vaccines  Your health care provider may recommend certain vaccines, such as:  Influenza vaccine. This is recommended every year.  Tetanus, diphtheria, and acellular pertussis (Tdap, Td) vaccine. You may need a Td booster every 10 years.  Zoster vaccine. You may need this after age 25.  Pneumococcal 13-valent conjugate (PCV13) vaccine. One dose is recommended after age 53.  Pneumococcal polysaccharide (PPSV23) vaccine. One dose is recommended after age 70. Talk to your health care provider about which screenings and vaccines you need and how often you need them. This information is not intended to replace advice given to you by your health care provider. Make sure you discuss any questions you have with your health care provider. Document Released: 05/02/2015 Document Revised: 12/24/2015 Document Reviewed: 02/04/2015 Elsevier Interactive Patient Education  2017 [place] Prevention in the Home Falls can cause injuries. They can happen to people of all ages. There are many things you can do to make your home safe and to help prevent falls. What can I do on the outside of my home?  Regularly fix the edges of walkways and driveways and fix any cracks.  Remove anything that might make you trip as you walk through a door, such as a raised step or threshold.  Trim any bushes or trees on the path to your home.  Use bright outdoor  lighting.  Clear any walking paths of anything that might make someone trip, such as rocks or tools.  Regularly check to see if handrails are loose or broken. Make sure that both sides of any steps have handrails.  Any raised decks and porches should have guardrails on the edges.  Have any leaves, snow, or ice cleared regularly.  Use sand or salt on walking paths during winter.  Clean up any spills in your garage right away. This includes oil or grease spills. What can I do in the bathroom?  Use night lights.  Install grab bars by the toilet and in the tub and shower. Do not use towel bars as grab bars.  Use non-skid mats or decals in the tub or shower.  If you need to sit down in the shower, use a plastic, non-slip stool.  Keep the floor dry. Clean up any water that spills on the floor as soon as it happens.  Remove soap buildup in the tub or shower regularly.  Attach bath mats securely with double-sided non-slip rug tape.  Do not have throw rugs and other things on the floor that can make you trip. What can I do in the bedroom?  Use night lights.  Make sure that you have a light by your bed that is easy to reach.  Do not use any sheets or blankets that are too big for your bed. They should  not hang down onto the floor.  Have a firm chair that has side arms. You can use this for support while you get dressed.  Do not have throw rugs and other things on the floor that can make you trip. What can I do in the kitchen?  Clean up any spills right away.  Avoid walking on wet floors.  Keep items that you use a lot in easy-to-reach places.  If you need to reach something above you, use a strong step stool that has a grab bar.  Keep electrical cords out of the way.  Do not use floor polish or wax that makes floors slippery. If you must use wax, use non-skid floor wax.  Do not have throw rugs and other things on the floor that can make you trip. What can I do with my  stairs?  Do not leave any items on the stairs.  Make sure that there are handrails on both sides of the stairs and use them. Fix handrails that are broken or loose. Make sure that handrails are as long as the stairways.  Check any carpeting to make sure that it is firmly attached to the stairs. Fix any carpet that is loose or worn.  Avoid having throw rugs at the top or bottom of the stairs. If you do have throw rugs, attach them to the floor with carpet tape.  Make sure that you have a light switch at the top of the stairs and the bottom of the stairs. If you do not have them, ask someone to add them for you. What else can I do to help prevent falls?  Wear shoes that:  Do not have high heels.  Have rubber bottoms.  Are comfortable and fit you well.  Are closed at the toe. Do not wear sandals.  If you use a stepladder:  Make sure that it is fully opened. Do not climb a closed stepladder.  Make sure that both sides of the stepladder are locked into place.  Ask someone to hold it for you, if possible.  Clearly mark and make sure that you can see:  Any grab bars or handrails.  First and last steps.  Where the edge of each step is.  Use tools that help you move around (mobility aids) if they are needed. These include:  Canes.  Walkers.  Scooters.  Crutches.  Turn on the lights when you go into a dark area. Replace any light bulbs as soon as they burn out.  Set up your furniture so you have a clear path. Avoid moving your furniture around.  If any of your floors are uneven, fix them.  If there are any pets around you, be aware of where they are.  Review your medicines with your doctor. Some medicines can make you feel dizzy. This can increase your chance of falling. Ask your doctor what other things that you can do to help prevent falls. This information is not intended to replace advice given to you by your health care provider. Make sure you discuss any  questions you have with your health care provider. Document Released: 01/30/2009 Document Revised: 09/11/2015 Document Reviewed: 05/10/2014 Elsevier Interactive Patient Education  2017 [employer].

## 2020-02-28 NOTE — Progress Notes (Signed)
Subjective:  Patient ID: Larry Stone, male    DOB: [birth year]  Age: 69 y.o. MRN: [id number]  CC: Follow-up (2 MONTH)   HPI Larry Stone presents for CVA, HTN, neuropathic pains - R hand, face  Outpatient Medications Prior to Visit  Medication Sig Dispense Refill  . albuterol (PROAIR HFA) 108 (90 Base) MCG/ACT inhaler Inhale 1-2 puffs into the lungs every 6 (six) hours as needed for wheezing or shortness of breath. 18 g 2  . albuterol (PROVENTIL) (2.5 MG/3ML) 0.083% nebulizer solution Use one vial in the nebulizer every 4-6 hours if needed for cough or wheeze 225 mL 11  . ALPRAZolam (XANAX) 0.5 MG tablet Take 1 tablet (0.5 mg total) by mouth 3 (three) times daily as needed. 90 tablet 1  . ammonium lactate (LAC-HYDRIN) 12 % lotion APPLY TOPICALLY AS DIRECTED. 400 mL 3  . budesonide-formoterol (SYMBICORT) 160-4.5 MCG/ACT inhaler INHALE 2 PUFFS INTO THE LUNGS 2 (TWO) TIMES DAILY. 3 Inhaler 0  . buPROPion (WELLBUTRIN SR) 150 MG 12 hr tablet Take 1 tablet (150 mg total) by mouth 2 (two) times daily. 180 tablet 3  . carvedilol (COREG) 25 MG tablet TAKE 1 TAB BY MOUTH 2 TIMES DAILY WITH A MEAL.ANNUAL APPT IN FEBUARY MUST SEE PROVIDER FOR REFILLS 180 tablet 3  . cetirizine (ZYRTEC) 10 MG tablet Take 1 tablet (10 mg total) by mouth daily. 30 tablet 11  . Cholecalciferol (VITAMIN D3) 5000 UNITS CAPS Take one tablet by mouth daily    . clopidogrel (PLAVIX) 75 MG tablet TAKE 1 TABLET BY MOUTH EVERY DAY 90 tablet 1  . clotrimazole-betamethasone (LOTRISONE) cream APPLY AS DIRECTED 45 g 11  . Coenzyme Q10 (COQ10) 100 MG CAPS Take as directed 30 each 11  . ezetimibe (ZETIA) 10 MG tablet TAKE 1 TABLET BY MOUTH EVERY DAY 90 tablet 3  . hydrALAZINE (APRESOLINE) 50 MG tablet TAKE 1 TABLET BY MOUTH THREE TIMES A DAY 270 tablet 3  . metFORMIN (GLUCOPHAGE) 500 MG tablet TAKE 1 TABLET BY MOUTH EVERY DAY WITH BREAKFAST 90 tablet 3  . montelukast (SINGULAIR) 10 MG tablet TAKE 1 TABLET BY MOUTH EVERYDAY AT  BEDTIME 90 tablet 3  . nitroGLYCERIN (NITROSTAT) 0.4 MG SL tablet PLACE 1 TABLET UNDER THE TONGUE EVERY 5 MINUTES AS NEEDED. FOR UP TO 3 DOSES. 25 tablet 2  . pravastatin (PRAVACHOL) 40 MG tablet TAKE 1 TABLET BY MOUTH EVERY DAY 90 tablet 3  . pregabalin (LYRICA) 50 MG capsule Take 1 capsule (50 mg total) by mouth 2 (two) times daily. 90 capsule 2  . sildenafil (VIAGRA) 100 MG tablet TAKE 1 TABLET BY MOUTH AS NEEDED FOR ERECTILE DYSFUNCTION. 30 tablet 3  . vitamin C (ASCORBIC ACID) 500 MG tablet Take 500 mg by mouth daily.      No facility-administered medications prior to visit.    ROS: Review of Systems  Constitutional: Negative for appetite change, fatigue and unexpected weight change.  HENT: Negative for congestion, nosebleeds, sneezing, sore throat and trouble swallowing.   Eyes: Negative for itching and visual disturbance.  Respiratory: Negative for cough.   Cardiovascular: Negative for chest pain, palpitations and leg swelling.  Gastrointestinal: Negative for abdominal distention, blood in stool, diarrhea and nausea.  Genitourinary: Negative for frequency and hematuria.  Musculoskeletal: Positive for neck pain. Negative for arthralgias, back pain, gait problem, joint swelling and neck stiffness.  Skin: Negative for rash.  Neurological: Positive for weakness. Negative for dizziness, tremors and speech difficulty.  Psychiatric/Behavioral: Negative for agitation,  dysphoric mood and sleep disturbance. The patient is not nervous/anxious.     Objective:  BP 120/70 (BP Location: Left Arm)   Pulse 64   Temp 98.3 F (36.8 C) (Oral)   Wt 187 lb 6.4 oz (85 kg)   SpO2 98%   BMI 27.67 kg/m   BP Readings from Last 3 Encounters:  02/28/20 120/70  02/28/20 120/70  02/06/20 113/65    Wt Readings from Last 3 Encounters:  02/28/20 187 lb 6.4 oz (85 kg)  02/28/20 187 lb 6.4 oz (85 kg)  02/06/20 192 lb 9.6 oz (87.4 kg)    Physical Exam Constitutional:      General: He is not in  acute distress.    Appearance: He is well-developed.     Comments: NAD  Eyes:     Conjunctiva/sclera: Conjunctivae normal.     Pupils: Pupils are equal, round, and reactive to light.  Neck:     Thyroid: No thyromegaly.     Vascular: No JVD.  Cardiovascular:     Rate and Rhythm: Normal rate and regular rhythm.     Heart sounds: Normal heart sounds. No murmur heard.  No friction rub. No gallop.   Pulmonary:     Effort: Pulmonary effort is normal. No respiratory distress.     Breath sounds: Normal breath sounds. No wheezing or rales.  Chest:     Chest wall: No tenderness.  Abdominal:     General: Bowel sounds are normal. There is no distension.     Palpations: Abdomen is soft. There is no mass.     Tenderness: There is no abdominal tenderness. There is no guarding or rebound.  Musculoskeletal:        General: Tenderness present. Normal range of motion.     Cervical back: Normal range of motion.  Lymphadenopathy:     Cervical: No cervical adenopathy.  Skin:    General: Skin is warm and dry.     Findings: No rash.  Neurological:     Mental Status: He is alert and oriented to person, place, and time.     Cranial Nerves: No cranial nerve deficit.     Motor: No abnormal muscle tone.     Coordination: Coordination normal.     Gait: Gait normal.     Deep Tendon Reflexes: Reflexes are normal and symmetric.  Psychiatric:        Behavior: Behavior normal.        Thought Content: Thought content normal.        Judgment: Judgment normal.   Trekking pole L anter neck - tender to palpation ?LNs   Lab Results  Component Value Date   WBC 9.9 12/26/2019   HGB 14.5 12/26/2019   HCT 43.0 12/26/2019   PLT 179 12/26/2019   GLUCOSE 86 12/26/2019   CHOL 144 12/26/2019   TRIG 91 12/26/2019   HDL 49 12/26/2019   LDLCALC 78 12/26/2019   ALT 16 12/26/2019   AST 19 12/26/2019   NA 139 12/26/2019   K 4.4 12/26/2019   CL 101 12/26/2019   CREATININE 1.54 (H) 12/26/2019   BUN 18  12/26/2019   CO2 28 12/26/2019   TSH 1.91 11/29/2018   PSA 0.5 12/26/2019   INR 1.1 12/04/2019   HGBA1C 5.9 06/19/2019    MR Brain W Wo Contrast  Result Date: 12/20/2019 CLINICAL DATA:  Acute stroke suspected. Right-sided numbness. Memory loss and confusion. EXAM: MRI HEAD WITHOUT AND WITH CONTRAST TECHNIQUE: Multiplanar, multiecho pulse sequences of  the brain and surrounding structures were obtained without and with intravenous contrast. CONTRAST:  77mL GADAVIST GADOBUTROL 1 MMOL/ML IV SOLN COMPARISON:  Head CT 12/04/2019 FINDINGS: Brain: Subacute infarction of the left lateral thalamus measuring about a cm in size. No other acute or subacute insult. Chronic small-vessel ischemic changes affect the pons. Old small vessel infarction of the superior cerebellum on the right. Cerebral hemispheres elsewhere show minimal small vessel change of the white matter. No large vessel territory infarction. No mass lesion, hemorrhage, hydrocephalus or extra-axial collection. After contrast administration, there is mild enhancement the left thalamic stroke. Vascular: Major vessels at the base of the brain show flow. Skull and upper cervical spine: Negative Sinuses/Orbits: Mucosal inflammatory changes of the paranasal sinuses, most pronounced in the right maxillary sinus. Orbits negative. Other: None IMPRESSION: 1. Subacute infarction of the left lateral thalamus measuring about a cm in size. No evidence of hemorrhage or mass effect. Some contrast enhancement present in this location, as often seen and subacute infarctions. 2. Chronic small-vessel ischemic changes elsewhere throughout the brain as outlined above. 3. Mucosal inflammatory changes of the paranasal sinuses, most pronounced in the right maxillary sinus. Electronically Signed   By: Nelson Chimes M.D.   On: 12/20/2019 08:44    Assessment & Plan:   There are no diagnoses linked to this encounter.   No orders of the defined types were placed in this  encounter.    Follow-up: No follow-ups on file.  [name], MD

## 2020-02-28 NOTE — Assessment & Plan Note (Signed)
S/p left thalamus infarct. There is no brain hemorrhage Plavix, Pravastatin

## 2020-02-28 NOTE — Assessment & Plan Note (Signed)
?  LNs vs other CT if not resolved in 2 wks

## 2020-03-31 ENCOUNTER — Ambulatory Visit: Payer: Medicare Other | Admitting: Cardiovascular Disease

## 2020-03-31 ENCOUNTER — Other Ambulatory Visit: Payer: Self-pay

## 2020-03-31 ENCOUNTER — Encounter: Payer: Self-pay | Admitting: Cardiovascular Disease

## 2020-03-31 VITALS — BP 120/64 | HR 81 | Ht 69.0 in | Wt 199.2 lb

## 2020-03-31 DIAGNOSIS — I5032 Chronic diastolic (congestive) heart failure: Secondary | ICD-10-CM | POA: Diagnosis not present

## 2020-03-31 DIAGNOSIS — I2510 Atherosclerotic heart disease of native coronary artery without angina pectoris: Secondary | ICD-10-CM

## 2020-03-31 DIAGNOSIS — I10 Essential (primary) hypertension: Secondary | ICD-10-CM | POA: Diagnosis not present

## 2020-03-31 DIAGNOSIS — E785 Hyperlipidemia, unspecified: Secondary | ICD-10-CM

## 2020-03-31 MED ORDER — HYDRALAZINE HCL 50 MG PO TABS: 50.0000 mg | ORAL_TABLET | Freq: Four times a day (QID) | ORAL | 3 refills | Status: DC

## 2020-03-31 NOTE — Progress Notes (Signed)
Chief Complaint  Patient presents with  . Follow-up    CAD    History of Present Illness: 69 yo male with history of CAD, HLD, HTN, OSA and chronic diastolic CHF here today for cardiac follow up. 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 Stress myoview in June 2015 with inferolateral scar and no ischemia. When I saw him in February 2018 he was c/o dyspnea and fatigue. Cardiac cath on 06/17/16 with 40% left main stenosis, mild disease in the diagonal and proximal circumflex. He has history of myalgias with Lipitor and Crestor so he has been on Pravastatin. He is allergic to Ace-inhibitors and ARBs and has has lower extremity edema with Norvasc. He was started on hydralazine in May of 2020. We have discussed Repatha but he has not wished to start due to cost. He had a stroke mid August 2021. He left the ED waiting area before being seen by a physician. EKG with sinus that night. Dr. [name] saw him in the office and arrange a head CT and brain MRI which confirmed the stroke. Carotid artery dopplers August 2021 with mild bilateral carotid artery disease.   He is here today for follow up. The patient denies any chest pain, dyspnea, palpitations, lower extremity edema, orthopnea, PND, dizziness, near syncope or syncope.   Primary Care Physician: Cassandria Anger, MD  Past Medical History:  Diagnosis Date  . Anxiety   . CAD    a. acute inferior lateral wall infarction in September 2004 treated medically. b.  cath 06/17/16 showing mild nonobstructive CAD with 30-40% ostial LM (eccentric), elevated LV filling pressures and normal LV function.  . Chronic diastolic CHF (congestive heart failure) ([place])   . CKD (chronic kidney disease), stage II   . COPD (chronic obstructive pulmonary disease) ([place])   . Diabetes mellitus   . Diverticulosis   . DJD (degenerative joint disease)   . GERD (gastroesophageal reflux  disease)   . HTN (hypertension)   . Hyperlipidemia   . Low back pain syndrome   . Myocardial infarction ([place]) 2004  . Obesity   . OSA (obstructive sleep apnea)   . RBBB   . Recurrent aspiration bronchitis/pneumonia   . Recurrent aspiration pneumonia ([place])    [name] 07/13/2016  . Thrombocytopenia ([place])   . Tubular adenoma of colon 2014    Past Surgical History:  Procedure Laterality Date  . CARDIAC CATHETERIZATION    . LEFT HEART CATH AND CORONARY ANGIOGRAPHY N/A 06/17/2016   Procedure: Left Heart Cath and Coronary Angiography;  Surgeon: Burnell Blanks, MD;  Location: [facility];  Service: Cardiovascular;  Laterality: N/A;  . UVULOPALATOPHARYNGOPLASTY     surgery for OSA    Current Outpatient Medications  Medication Sig Dispense Refill  . albuterol (PROAIR HFA) 108 (90 Base) MCG/ACT inhaler Inhale 1-2 puffs into the lungs every 6 (six) hours as needed for wheezing or shortness of breath. 18 g 2  . albuterol (PROVENTIL) (2.5 MG/3ML) 0.083% nebulizer solution Use one vial in the nebulizer every 4-6 hours if needed for cough or wheeze 225 mL 11  . ALPRAZolam (XANAX) 0.5 MG tablet Take 1 tablet (0.5 mg total) by mouth 3 (three) times daily as needed. 90 tablet 1  . ammonium lactate (LAC-HYDRIN) 12 % lotion APPLY TOPICALLY AS DIRECTED. 400 mL 3  . budesonide-formoterol (SYMBICORT) 160-4.5 MCG/ACT inhaler INHALE 2 PUFFS INTO THE LUNGS 2 (  TWO) TIMES DAILY. 3 Inhaler 0  . buPROPion (WELLBUTRIN SR) 150 MG 12 hr tablet Take 1 tablet (150 mg total) by mouth 2 (two) times daily. 180 tablet 3  . carvedilol (COREG) 25 MG tablet TAKE 1 TAB BY MOUTH 2 TIMES DAILY WITH A MEAL.ANNUAL APPT IN FEBUARY MUST SEE PROVIDER FOR REFILLS 180 tablet 3  . cetirizine (ZYRTEC) 10 MG tablet Take 1 tablet (10 mg total) by mouth daily. 30 tablet 11  . Cholecalciferol (VITAMIN D3) 5000 UNITS CAPS Take one tablet by mouth daily    . clopidogrel (PLAVIX) 75 MG tablet TAKE 1 TABLET BY MOUTH EVERY DAY 90  tablet 1  . clotrimazole-betamethasone (LOTRISONE) cream APPLY AS DIRECTED 45 g 11  . Coenzyme Q10 (COQ10) 100 MG CAPS Take as directed 30 each 11  . ezetimibe (ZETIA) 10 MG tablet TAKE 1 TABLET BY MOUTH EVERY DAY 90 tablet 3  . metFORMIN (GLUCOPHAGE) 500 MG tablet TAKE 1 TABLET BY MOUTH EVERY DAY WITH BREAKFAST 90 tablet 3  . montelukast (SINGULAIR) 10 MG tablet TAKE 1 TABLET BY MOUTH EVERYDAY AT BEDTIME 90 tablet 3  . nitroGLYCERIN (NITROSTAT) 0.4 MG SL tablet PLACE 1 TABLET UNDER THE TONGUE EVERY 5 MINUTES AS NEEDED. FOR UP TO 3 DOSES. 25 tablet 2  . pravastatin (PRAVACHOL) 40 MG tablet TAKE 1 TABLET BY MOUTH EVERY DAY 90 tablet 3  . pregabalin (LYRICA) 50 MG capsule Take 1 capsule (50 mg total) by mouth 2 (two) times daily. 90 capsule 2  . sildenafil (VIAGRA) 100 MG tablet TAKE 1 TABLET BY MOUTH AS NEEDED FOR ERECTILE DYSFUNCTION. 30 tablet 3  . vitamin C (ASCORBIC ACID) 500 MG tablet Take 500 mg by mouth daily.     . hydrALAZINE (APRESOLINE) 50 MG tablet Take 1 tablet (50 mg total) by mouth 4 (four) times daily. 360 tablet 3   No current facility-administered medications for this visit.    Allergies  Allergen Reactions  . Ace Inhibitors Swelling  . Angiotensin Receptor Blockers Swelling  . Aspirin Hives  . Amlodipine     Leg swelling  . Crestor [Rosuvastatin Calcium]     myalgias  . Lipitor [Atorvastatin Calcium]     arthralgias  . Codeine Nausea And Vomiting  . Irbesartan Other (See Comments)    REACTION: allergic to ARBs w/ angioedema  . Ramipril Other (See Comments)    REACTION: Allergic to ACE's w/ angioedema...    Social History   Socioeconomic History  . Marital status: Married    Spouse name: Not on file  . Number of children: 1  . Years of education: Not on file  . Highest education level: Not on file  Occupational History  . Occupation: Probation officer of [employer]  Tobacco Use  . Smoking status: Former Smoker    Packs/day: 1.00    Years: 20.00     Pack years: 20.00    Types: Cigarettes, Cigars    Quit date: 05/08/1998    Years since quitting: 21.9  . Smokeless tobacco: Never Used  . Tobacco comment: quit in 2005  Vaping Use  . Vaping Use: Never used  Substance and Sexual Activity  . Alcohol use: Yes    Alcohol/week: 1.0 standard drink    Types: 1 Standard drinks or equivalent per week    Comment: social drinker  . Drug use: No  . Sexual activity: Yes  Other Topics Concern  . Not on file  Social History Narrative   Lives with spouse only  Right Handed   Drinks <1 cup caffeine daily   Social Determinants of Health   Financial Resource Strain: Low Risk   . Difficulty of Paying Living Expenses: Not hard at all  Food Insecurity: No Food Insecurity  . Worried About Charity fundraiser in the Last Year: Never true  . Ran Out of Food in the Last Year: Never true  Transportation Needs: No Transportation Needs  . Lack of Transportation (Medical): No  . Lack of Transportation (Non-Medical): No  Physical Activity: Inactive  . Days of Exercise per Week: 0 days  . Minutes of Exercise per Session: 0 min  Stress: No Stress Concern Present  . Feeling of Stress : Not at all  Social Connections: Unknown  . Frequency of Communication with Friends and Family: More than three times a week  . Frequency of Social Gatherings with Friends and Family: More than three times a week  . Attends Religious Services: Patient refused  . Active Member of Clubs or Organizations: Patient refused  . Attends Archivist Meetings: Patient refused  . Marital Status: Patient refused  Intimate Partner Violence: Not on file    Family History  Problem Relation Age of Onset  . Colon cancer Father   . Stroke Mother   . Heart disease Mother   . Hypertension Sister   . Diabetes Sister   . Colon cancer Paternal Uncle     Review of Systems:  As stated in the HPI and otherwise negative.   BP 120/64   Pulse 81   Ht 5\' 9"  (1.753 m)   Wt 199 lb  3.2 oz (90.4 kg)   SpO2 95%   BMI 29.42 kg/m   Physical Examination:  General: Well developed, well nourished, NAD  HEENT: OP clear, mucus membranes moist  SKIN: warm, dry. No rashes. Neuro: No focal deficits  Musculoskeletal: Muscle strength 5/5 all ext  Psychiatric: Mood and affect normal  Neck: No JVD, no carotid bruits, no thyromegaly, no lymphadenopathy.  Lungs:Clear bilaterally, no wheezes, rhonci, crackles Cardiovascular: Regular rate and rhythm. No murmurs, gallops or rubs. Abdomen:Soft. Bowel sounds present. Non-tender.  Extremities: No lower extremity edema. Pulses are 2 + in the bilateral DP/PT.  Echo September 2021:  1. Left ventricular ejection fraction, by estimation, is 45 to 50%. The  left ventricle has mildly decreased function. The left ventricle  demonstrates regional wall motion abnormalities (see scoring  diagram/findings for description). There is mild  concentric left ventricular hypertrophy. Left ventricular diastolic  parameters are consistent with Grade I diastolic dysfunction (impaired  relaxation). There is moderate hypokinesis of the left ventricular,  basal-mid inferolateral wall.  2. Right ventricular systolic function is normal. The right ventricular  size is normal.  3. The mitral valve is normal in structure. No evidence of mitral valve  regurgitation.  4. The aortic valve is tricuspid. Aortic valve regurgitation is not  visualized. No aortic stenosis is present.  Cardiac cath 06/17/16:  Ost Cx to Mid Cx lesion, 20 %stenosed.  Ost LM lesion, 40 %stenosed.  1st Diag lesion, 40 %stenosed.  The left ventricular systolic function is normal.  LV end diastolic pressure is normal.  The left ventricular ejection fraction is greater than 65% by visual estimate.  There is no mitral valve regurgitation.     EKG:  EKG is not ordered today. The ekg ordered today demonstrates    Recent Labs: 12/26/2019: ALT 16; Hemoglobin 14.5; Platelets  179 02/28/2020: BUN 21; Creatinine, Ser 1.29; Potassium 3.8;  Sodium 140   Lipid Panel    Component Value Date/Time   CHOL 144 12/26/2019 1040   TRIG 91 12/26/2019 1040   HDL 49 12/26/2019 1040   CHOLHDL 2.9 12/26/2019 1040   VLDL 12.6 06/19/2019 1253   LDLCALC 78 12/26/2019 1040     Wt Readings from Last 3 Encounters:  03/31/20 199 lb 3.2 oz (90.4 kg)  02/28/20 187 lb 6.4 oz (85 kg)  02/28/20 187 lb 6.4 oz (85 kg)     Other studies Reviewed: Additional studies/ records that were reviewed today include: . Review of the above records demonstrates:    Assessment and Plan:  1. CAD without angina: He has no chest pain. Cardiac cath March 2018 with mild to moderate non-obstructive CAD. Continue beta blocker and statin. He is on Plavix because of his stroke in 2021.   2. HYPERTENSION:  BP is controlled but has been elevated at home. Will increase Hydralazine to 50 mg QID. Continue Coreg.    3. HYPERLIPIDEMIA: LDL near goal. Continue Zetia and statin.   4. Chronic diastolic CHF: Weight is stable. No volume overload on exam. Continue Lasix as needed.   5. Stroke: Followed in neurology. Cardiac monitor with sinus and PVCs. Loop recorder not recommended by Neuro team.   Current medicines are reviewed at length with the patient today.  The patient does not have concerns regarding medicines.  The following changes have been made:  no change  Labs/ tests ordered today include:   No orders of the defined types were placed in this encounter.   Disposition:   F/U with me 12 months  Signed, Lauree Chandler, MD 03/31/2020 11:42 AM    [facility] [address], [place], [place]  [address] Phone: [phone]; Fax: [phone]

## 2020-03-31 NOTE — Patient Instructions (Signed)
Medication Instructions:  Your physician has recommended you make the following change in your medication:  1.) increase hydralazine 50 mg to (4) four times a day  *If you need a refill on your cardiac medications before your next appointment, please call your pharmacy*   Lab Work: none If you have labs (blood work) drawn today and your tests are completely normal, you will receive your results only by: Marland Kitchen MyChart Message (if you have MyChart) OR . A paper copy in the mail If you have any lab test that is abnormal or we need to change your treatment, we will call you to review the results.   Testing/Procedures: none   Follow-Up: At [facility], you and your health needs are our priority.  As part of our continuing mission to provide you with exceptional heart care, we have created designated Provider Care Teams.  These Care Teams include your primary Cardiologist (physician) and Advanced Practice Providers (APPs -  Physician Assistants and Nurse Practitioners) who all work together to provide you with the care you need, when you need it.   Your next appointment:   12 month(s)  The format for your next appointment:   In Person  Provider:   You may see Lauree Chandler, MD or one of the following Advanced Practice Providers on your designated Care Team:    Melina Copa, PA-C  Ermalinda Barrios, PA-C   Other Instructions

## 2020-04-24 ENCOUNTER — Other Ambulatory Visit: Payer: Self-pay | Admitting: Internal Medicine

## 2020-05-16 ENCOUNTER — Other Ambulatory Visit: Payer: Self-pay | Admitting: Neurology

## 2020-06-03 ENCOUNTER — Encounter: Payer: Self-pay | Admitting: Internal Medicine

## 2020-06-03 ENCOUNTER — Other Ambulatory Visit: Payer: Self-pay

## 2020-06-03 ENCOUNTER — Ambulatory Visit: Payer: Medicare Other | Admitting: Internal Medicine

## 2020-06-03 VITALS — BP 130/72 | HR 60 | Temp 97.7°F | Ht 69.0 in | Wt 201.9 lb

## 2020-06-03 DIAGNOSIS — I6381 Other cerebral infarction due to occlusion or stenosis of small artery: Secondary | ICD-10-CM

## 2020-06-03 DIAGNOSIS — F09 Unspecified mental disorder due to known physiological condition: Secondary | ICD-10-CM

## 2020-06-03 DIAGNOSIS — F411 Generalized anxiety disorder: Secondary | ICD-10-CM

## 2020-06-03 DIAGNOSIS — I639 Cerebral infarction, unspecified: Secondary | ICD-10-CM | POA: Diagnosis not present

## 2020-06-03 DIAGNOSIS — I2510 Atherosclerotic heart disease of native coronary artery without angina pectoris: Secondary | ICD-10-CM

## 2020-06-03 DIAGNOSIS — F418 Other specified anxiety disorders: Secondary | ICD-10-CM

## 2020-06-03 DIAGNOSIS — R739 Hyperglycemia, unspecified: Secondary | ICD-10-CM | POA: Diagnosis not present

## 2020-06-03 DIAGNOSIS — N183 Chronic kidney disease, stage 3 unspecified: Secondary | ICD-10-CM

## 2020-06-03 MED ORDER — SILDENAFIL CITRATE 100 MG PO TABS: ORAL_TABLET | ORAL | 3 refills | Status: DC

## 2020-06-03 NOTE — Assessment & Plan Note (Signed)
On Coreg, amlodipine, Lasix, Plavix

## 2020-06-03 NOTE — Assessment & Plan Note (Signed)
  Wellbutrin Xanax prn  Potential benefits of a long term benzodiazepines  use as well as potential risks  and complications were explained to the patient and were aknowledged.

## 2020-06-03 NOTE — Patient Instructions (Signed)
Drink more water 

## 2020-06-03 NOTE — Assessment & Plan Note (Signed)
Xanax prn  Potential benefits of a long term benzodiazepines  use as well as potential risks  and complications were explained to the patient and were aknowledged. 

## 2020-06-03 NOTE — Assessment & Plan Note (Signed)
Coreg, Hydralazine

## 2020-06-03 NOTE — Assessment & Plan Note (Signed)
Try Lion's mane 

## 2020-06-03 NOTE — Progress Notes (Signed)
Subjective:  Patient ID: Larry Stone, male    DOB: November 12, 1950  Age: 70 y.o. MRN: [id number]  CC: Follow-up (3 month f/u- Req written rx for his viagra)   HPI [name] presents for CVA, CAD, neuropathy Lyrica did not help yet  His dog died 2/22-she is very upset.  She is getting a Interior and spatial designer later today  Outpatient Medications Prior to Visit  Medication Sig Dispense Refill  . albuterol (PROAIR HFA) 108 (90 Base) MCG/ACT inhaler Inhale 1-2 puffs into the lungs every 6 (six) hours as needed for wheezing or shortness of breath. 18 g 2  . albuterol (PROVENTIL) (2.5 MG/3ML) 0.083% nebulizer solution Use one vial in the nebulizer every 4-6 hours if needed for cough or wheeze 225 mL 11  . ALPRAZolam (XANAX) 0.5 MG tablet TAKE 1 TABLET (0.5 MG TOTAL) BY MOUTH 3 (THREE) TIMES DAILY AS NEEDED. 90 tablet 1  . ammonium lactate (LAC-HYDRIN) 12 % lotion APPLY TOPICALLY AS DIRECTED. 400 mL 3  . budesonide-formoterol (SYMBICORT) 160-4.5 MCG/ACT inhaler INHALE 2 PUFFS INTO THE LUNGS 2 (TWO) TIMES DAILY. 3 Inhaler 0  . buPROPion (WELLBUTRIN SR) 150 MG 12 hr tablet Take 1 tablet (150 mg total) by mouth 2 (two) times daily. 180 tablet 3  . carvedilol (COREG) 25 MG tablet TAKE 1 TAB BY MOUTH 2 TIMES DAILY WITH A MEAL.ANNUAL APPT IN FEBUARY MUST SEE PROVIDER FOR REFILLS 180 tablet 3  . cetirizine (ZYRTEC) 10 MG tablet Take 1 tablet (10 mg total) by mouth daily. 30 tablet 11  . Cholecalciferol (VITAMIN D3) 5000 UNITS CAPS Take one tablet by mouth daily    . clopidogrel (PLAVIX) 75 MG tablet TAKE 1 TABLET BY MOUTH EVERY DAY 90 tablet 1  . clotrimazole-betamethasone (LOTRISONE) cream APPLY AS DIRECTED 45 g 11  . Coenzyme Q10 (COQ10) 100 MG CAPS Take as directed 30 each 11  . ezetimibe (ZETIA) 10 MG tablet TAKE 1 TABLET BY MOUTH EVERY DAY 90 tablet 3  . hydrALAZINE (APRESOLINE) 50 MG tablet Take 1 tablet (50 mg total) by mouth 4 (four) times daily. 360 tablet 3  . metFORMIN (GLUCOPHAGE) 500  MG tablet TAKE 1 TABLET BY MOUTH EVERY DAY WITH BREAKFAST 90 tablet 3  . montelukast (SINGULAIR) 10 MG tablet TAKE 1 TABLET BY MOUTH EVERYDAY AT BEDTIME 90 tablet 3  . nitroGLYCERIN (NITROSTAT) 0.4 MG SL tablet PLACE 1 TABLET UNDER THE TONGUE EVERY 5 MINUTES AS NEEDED. FOR UP TO 3 DOSES. 25 tablet 2  . pravastatin (PRAVACHOL) 40 MG tablet TAKE 1 TABLET BY MOUTH EVERY DAY 90 tablet 3  . pregabalin (LYRICA) 50 MG capsule TAKE 1 CAPSULE BY MOUTH TWICE A DAY 60 capsule 5  . sildenafil (VIAGRA) 100 MG tablet TAKE 1 TABLET BY MOUTH AS NEEDED FOR ERECTILE DYSFUNCTION. 30 tablet 3  . vitamin C (ASCORBIC ACID) 500 MG tablet Take 500 mg by mouth daily.      No facility-administered medications prior to visit.    ROS: Review of Systems  Constitutional: Negative for appetite change, fatigue and unexpected weight change.  HENT: Negative for congestion, nosebleeds, sneezing, sore throat and trouble swallowing.   Eyes: Negative for itching and visual disturbance.  Respiratory: Negative for cough.   Cardiovascular: Negative for chest pain, palpitations and leg swelling.  Gastrointestinal: Negative for abdominal distention, blood in stool, diarrhea and nausea.  Genitourinary: Negative for frequency and hematuria.  Musculoskeletal: Negative for back pain, gait problem, joint swelling and neck pain.  Skin: Negative  for rash.  Neurological: Negative for dizziness, tremors, speech difficulty and weakness.  Psychiatric/Behavioral: Positive for decreased concentration. Negative for agitation, dysphoric mood, sleep disturbance and suicidal ideas. The patient is not nervous/anxious.     Objective:  BP 130/72 (BP Location: Left Arm)   Pulse 60   Temp 97.7 F (36.5 C) (Oral)   Ht 5\' 9"  (1.753 m)   Wt 201 lb 14.4 oz (91.6 kg)   SpO2 97%   BMI 29.82 kg/m   BP Readings from Last 3 Encounters:  06/03/20 130/72  03/31/20 120/64  02/28/20 120/70    Wt Readings from Last 3 Encounters:  06/03/20 201 lb 14.4  oz (91.6 kg)  03/31/20 199 lb 3.2 oz (90.4 kg)  02/28/20 187 lb 6.4 oz (85 kg)    Physical Exam Constitutional:      General: He is not in acute distress.    Appearance: He is well-developed.     Comments: NAD  HENT:     Mouth/Throat:     Mouth: Oropharynx is clear and moist.  Eyes:     Conjunctiva/sclera: Conjunctivae normal.     Pupils: Pupils are equal, round, and reactive to light.  Neck:     Thyroid: No thyromegaly.     Vascular: No JVD.  Cardiovascular:     Rate and Rhythm: Normal rate and regular rhythm.     Pulses: Intact distal pulses.     Heart sounds: Normal heart sounds. No murmur heard. No friction rub. No gallop.   Pulmonary:     Effort: Pulmonary effort is normal. No respiratory distress.     Breath sounds: Normal breath sounds. No wheezing or rales.  Chest:     Chest wall: No tenderness.  Abdominal:     General: Bowel sounds are normal. There is no distension.     Palpations: Abdomen is soft. There is no mass.     Tenderness: There is no abdominal tenderness. There is no guarding or rebound.  Musculoskeletal:        General: No tenderness or edema. Normal range of motion.     Cervical back: Normal range of motion.  Lymphadenopathy:     Cervical: No cervical adenopathy.  Skin:    General: Skin is warm and dry.     Findings: No rash.  Neurological:     Mental Status: He is alert and oriented to person, place, and time.     Cranial Nerves: No cranial nerve deficit.     Motor: No abnormal muscle tone.     Coordination: He displays a negative Romberg sign. Coordination abnormal.     Gait: Gait abnormal.     Deep Tendon Reflexes: Reflexes are normal and symmetric.  Psychiatric:        Mood and Affect: Mood and affect normal.        Behavior: Behavior normal.        Thought Content: Thought content normal.        Judgment: Judgment normal.   Pt got lightheaded briefly when stood up; resolved - we walked to the lobby okay.  He was back to feeling  normal  Lab Results  Component Value Date   WBC 9.9 12/26/2019   HGB 14.5 12/26/2019   HCT 43.0 12/26/2019   PLT 179 12/26/2019   GLUCOSE 74 02/28/2020   CHOL 144 12/26/2019   TRIG 91 12/26/2019   HDL 49 12/26/2019   LDLCALC 78 12/26/2019   ALT 16 12/26/2019   AST 19 12/26/2019  NA 140 02/28/2020   K 3.8 02/28/2020   CL 102 02/28/2020   CREATININE 1.29 02/28/2020   BUN 21 02/28/2020   CO2 30 02/28/2020   TSH 1.91 11/29/2018   PSA 0.5 12/26/2019   INR 1.1 12/04/2019   HGBA1C 5.6 02/28/2020    MR Brain W Wo Contrast  Result Date: 12/20/2019 CLINICAL DATA:  Acute stroke suspected. Right-sided numbness. Memory loss and confusion. EXAM: MRI HEAD WITHOUT AND WITH CONTRAST TECHNIQUE: Multiplanar, multiecho pulse sequences of the brain and surrounding structures were obtained without and with intravenous contrast. CONTRAST:  28mL GADAVIST GADOBUTROL 1 MMOL/ML IV SOLN COMPARISON:  Head CT 12/04/2019 FINDINGS: Brain: Subacute infarction of the left lateral thalamus measuring about a cm in size. No other acute or subacute insult. Chronic small-vessel ischemic changes affect the pons. Old small vessel infarction of the superior cerebellum on the right. Cerebral hemispheres elsewhere show minimal small vessel change of the white matter. No large vessel territory infarction. No mass lesion, hemorrhage, hydrocephalus or extra-axial collection. After contrast administration, there is mild enhancement the left thalamic stroke. Vascular: Major vessels at the base of the brain show flow. Skull and upper cervical spine: Negative Sinuses/Orbits: Mucosal inflammatory changes of the paranasal sinuses, most pronounced in the right maxillary sinus. Orbits negative. Other: None IMPRESSION: 1. Subacute infarction of the left lateral thalamus measuring about a cm in size. No evidence of hemorrhage or mass effect. Some contrast enhancement present in this location, as often seen and subacute infarctions. 2. Chronic  small-vessel ischemic changes elsewhere throughout the brain as outlined above. 3. Mucosal inflammatory changes of the paranasal sinuses, most pronounced in the right maxillary sinus. Electronically Signed   By: Nelson Chimes M.D.   On: 12/20/2019 08:44    Assessment & Plan:    Follow-up: No follow-ups on file.  [name], MD

## 2020-06-03 NOTE — Assessment & Plan Note (Signed)
BMET 

## 2020-06-03 NOTE — Assessment & Plan Note (Signed)
Plavix, Pravastatin On Lyrica for R fingers, cheek tingling  Try Lions mane

## 2020-06-10 ENCOUNTER — Ambulatory Visit: Payer: Medicare Other | Admitting: Adult Health

## 2020-06-16 ENCOUNTER — Ambulatory Visit: Payer: Medicare Other | Admitting: Adult Health

## 2020-06-19 ENCOUNTER — Ambulatory Visit: Payer: Medicare Other | Admitting: Adult Health

## 2020-06-19 ENCOUNTER — Encounter: Payer: Self-pay | Admitting: Adult Health

## 2020-06-19 VITALS — BP 135/66 | HR 51 | Ht 69.0 in | Wt 204.0 lb

## 2020-06-19 DIAGNOSIS — R202 Paresthesia of skin: Secondary | ICD-10-CM | POA: Diagnosis not present

## 2020-06-19 DIAGNOSIS — I639 Cerebral infarction, unspecified: Secondary | ICD-10-CM | POA: Diagnosis not present

## 2020-06-19 DIAGNOSIS — I6381 Other cerebral infarction due to occlusion or stenosis of small artery: Secondary | ICD-10-CM

## 2020-06-19 NOTE — Patient Instructions (Signed)
Concern that your fluctuation of symptoms could be due to the use of Lyrica.  Recommend you discontinue and I will call you next week to see how you are feeling.  If you are feeling better, we can further discuss other treatment options of your hand and face pain that may also benefit in regards to depression and anxiety  Additional information provided with exercises that you can do at home to help with sensory reeducation  Continue clopidogrel 75 mg daily  and pravastatin for secondary stroke prevention  Continue to follow up with PCP regarding cholesterol and blood pressure management  Maintain strict control of hypertension with blood pressure goal below 130/90 and cholesterol with LDL cholesterol (bad cholesterol) goal below 70 mg/dL.       Followup in the future with me in 3 months or call earlier if needed       Thank you for coming to see [place] at [facility]. I hope we have been able to provide you high quality care today.  You may receive a patient satisfaction survey over the next few weeks. We would appreciate your feedback and comments so that we may continue to improve ourselves and the health of our patients.       Emotional Health After Stroke Your emotional health may change after a stroke. You may have fear, anxiety, anger, sadness, and other feelings. Some of these changes happen because a stroke can damage your brain and nervous system. You may also have these feelings because coping with a change in your health can feel overwhelming. Depression and other emotional changes can slow your recovery after a stroke. It is important to recognize the symptoms so that you can take steps to strengthen your emotional health. What are some common emotions after a stroke? You may have:  Fear.  Anxiety.  Anger.  Frustration.  Sadness.  Feelings of loss or grief.  Depression.  Crying or laughing at the wrong time or wrong situation (pseudobulbar  affect, orPBA). What are the symptoms of depression? Depression after a stroke can happen right away, or it can show up later. Symptoms of depression may include:  Sleep problems.  Changes in normal eating habits, such as eating too much or too little.  Weight gain or weight loss.  Not having energy or enthusiasm (lethargy).  Feeling very tired (fatigue).  Avoiding people and activities (social withdrawal).  Irritability.  Crying more than usual.  Mood swings.  Not being able to concentrate.  Feeling hopeless.  Hating yourself.  Having suicidal thoughts. If you ever feel like you may hurt yourself or others, or have thoughts about taking your own life, get help right away. You can go to your nearest emergency department or call:  Your local emergency services (911 in the [place]  A suicide crisis helpline, such as the [place] at 6151370575. This is open 24 hours a day. What increases my risk of depression? Having a stroke raises the risk of depression. The risk also goes up if you:  Are socially isolated.  Have a family history of depression.  Have a history of depression or other mental health problems before the stroke.  Are unable to work or do activities that you previously enjoyed.  Need help from others for daily activities.  Use drugs or drink alcohol.  Take certain medicines, such as sleeping pills or high blood pressure medicines. What are some coping methods I can use? Ask your health care provider for help.  Your health care provider may recommend treatments for depression, such as:  Talk therapy or counseling with a mental health professional. This may include cognitive behavioral therapy (CBT) to help change your patterns of thinking.  Medical therapies, such as brain stimulation or light therapy.  Lifestyle changes, such as eating a healthy diet and avoiding alcohol.  Antidepressant medicines.  Alternative  therapies, such as acupuncture, music therapy, or pet therapy. Other coping strategies you may try include:  Physical therapy or exercises. Try to do some exercise every day.  Writing your thoughts in a journal. An example might be keeping track of unrealistic thoughts or keeping a log of things you are thankful for.  Practicing good sleep habits, such as getting up at the same time every day.  Following a predictable routine each day.  Participating in activities that make you laugh.  Mindfulness therapy. This may include meditation and other techniques to lower your stress.  Joining a support group for people who are recovering from a stroke. These groups provide social interaction and help you feel connected to others. Your health care team can help you find a support group in your area.   Summary  Your emotional health may change after a stroke. You may have fear, anxiety, anger, sadness, and other feelings.  It is important to recognize the symptoms of depression and other emotional problems.  If you experience emotional changes, let your loved ones know and contact your health care provider. This information is not intended to replace advice given to you by your health care provider. Make sure you discuss any questions you have with your health care provider. Document Revised: 03/18/2017 Document Reviewed: 07/09/2016 Elsevier Patient Education  2021 [employer].

## 2020-06-19 NOTE — Progress Notes (Signed)
I agree with the above plan 

## 2020-06-19 NOTE — Progress Notes (Signed)
[facility] [address] [place]. [place] [address] [phone]       OFFICE FOLLOW UP NOTE  [name] Date of Birth:  [birth year] Medical Record Number:  [id number] Take care: Referring MD: [name]  Reason for Referral: Stroke   Chief Complaint  Patient presents with  . Follow-up    TR alone PT states things isnt going well, has list of things going on. Tingling in hands and R side of face.      HPI:   Today, 06/19/2020, Larry Stone returns for stroke follow-up after prior visit approximately 5 months ago with [name].  Topiramate initiated at prior visit for poststroke paresthesias but unable to tolerate. Placed on Lyrica 50 mg twice daily beginning of November and reports approximately 2 weeks after, he was noticing increased lethargy and fluctuation of gait impairment with imbalance (getting pulled to the right) and lightheadedness, slurred speech and cognitive difficulties such as delayed responses and short-term memory difficulties.  Gait impairment with imbalance and slurred speech all present initially with stroke.  Denies new stroke/TIA symptoms.   He is unsure if Lyrica was beneficial for paresthesias as he does report some improvement since prior visit but right hand and right facial paresthesias still present. He also admits to struggling with depression with history of depression PTA on bupropion 150 mg twice daily but noticed worsening after his stroke.  He is also on Xanax as needed per PCP He has been trying to keep active and does report doing HEP daily as advised during therapy sessions   Reports compliance on Plavix and pravastatin and Zetia-denies side effects Blood pressure today 135/66  No further concerns at this time    History provided for reference purposes only Initial visit 02/06/2020 [name]: Larry Stone is a pleasant 70 year old Caucasian male with past medical history of hypertension, hyper lipidemia, diabetes,  congestive heart failure and coronary artery disease who developed sudden onset of right lower face and hand numbness as well as imbalance and difficulty walking on 12/03/2019.  He did not seek medical help the same day as if waited for it to get better.  Next day he went to the hospital but got tired waiting in the ER since he was not seen and instead went to see his primary physician Dr. [name] who ordered an outpatient stroke work-up.  MRI scan of the brain done on 12/20/2019 shows a subacute left thalamic lacunar infarct.  Carotid ultrasound on 12/10/2019 showed no significant extracranial stenosis.  Echocardiogram on 12/28/2018 showed slightly diminished ejection fraction of 45 to 50% but no clot or definite cardiac source of embolism was noted.  He underwent 30 days of outpatient telemetry cardiac monitoring which was negative for atrial fibrillation or significant arrhythmias.  Carotid ultrasound on 12/10/2019 was unremarkable.  LDL cholesterol was 78 mg percent on 12/26/2019.  Patient is presently on Plavix 75 mg daily which is tolerating well without bruising or bleeding.  He states his gait and balance have improved and he has finished outpatient physical and occupational therapy.  He still stumbles occasionally but as long to walk slowly and carefully.  He has had no falls or injuries.  He however still has persistent paresthesias in his right face and right hand which are bothersome and he would like to try some medications for this.  He has had no recurrent stroke or TIA symptoms.  States his blood pressure is well controlled and today it is 113/65 in office.  States  his sugars are also doing well however he cannot tell me when his last hemoglobin A1c was checked.  He has been scheduled to undergo loop recorder pending my consultation visit today.  ROS:   14 system review of systems is positive for those listed in HPI and all other systems negative  PMH:  Past Medical History:  Diagnosis Date  .  Anxiety   . CAD    a. acute inferior lateral wall infarction in September 2004 treated medically. b.  cath 06/17/16 showing mild nonobstructive CAD with 30-40% ostial LM (eccentric), elevated LV filling pressures and normal LV function.  . Chronic diastolic CHF (congestive heart failure) ([place])   . CKD (chronic kidney disease), stage II   . COPD (chronic obstructive pulmonary disease) ([place])   . Diabetes mellitus   . Diverticulosis   . DJD (degenerative joint disease)   . GERD (gastroesophageal reflux disease)   . HTN (hypertension)   . Hyperlipidemia   . Low back pain syndrome   . Myocardial infarction ([place]) 2004  . Obesity   . OSA (obstructive sleep apnea)   . RBBB   . Recurrent aspiration bronchitis/pneumonia   . Recurrent aspiration pneumonia ([place])    [name] 07/13/2016  . Thrombocytopenia ([place])   . Tubular adenoma of colon 2014    Social History:  Social History   Socioeconomic History  . Marital status: Married    Spouse name: Not on file  . Number of children: 1  . Years of education: Not on file  . Highest education level: Not on file  Occupational History  . Occupation: Probation officer of [employer]  Tobacco Use  . Smoking status: Former Smoker    Packs/day: 1.00    Years: 20.00    Pack years: 20.00    Types: Cigarettes, Cigars    Quit date: 05/08/1998    Years since quitting: 22.1  . Smokeless tobacco: Never Used  . Tobacco comment: quit in 2005  Vaping Use  . Vaping Use: Never used  Substance and Sexual Activity  . Alcohol use: Yes    Alcohol/week: 1.0 standard drink    Types: 1 Standard drinks or equivalent per week    Comment: social drinker  . Drug use: No  . Sexual activity: Yes  Other Topics Concern  . Not on file  Social History Narrative   Lives with spouse only   Right Handed   Drinks <1 cup caffeine daily   Social Determinants of Health   Financial Resource Strain: Low Risk   . Difficulty of Paying Living Expenses: Not hard at all   Food Insecurity: No Food Insecurity  . Worried About Charity fundraiser in the Last Year: Never true  . Ran Out of Food in the Last Year: Never true  Transportation Needs: No Transportation Needs  . Lack of Transportation (Medical): No  . Lack of Transportation (Non-Medical): No  Physical Activity: Inactive  . Days of Exercise per Week: 0 days  . Minutes of Exercise per Session: 0 min  Stress: No Stress Concern Present  . Feeling of Stress : Not at all  Social Connections: Unknown  . Frequency of Communication with Friends and Family: More than three times a week  . Frequency of Social Gatherings with Friends and Family: More than three times a week  . Attends Religious Services: Patient refused  . Active Member of Clubs or Organizations: Patient refused  . Attends Archivist Meetings: Patient refused  . Marital Status: Patient  refused  Intimate Partner Violence: Not on file    Medications:   Current Outpatient Medications on File Prior to Visit  Medication Sig Dispense Refill  . albuterol (PROAIR HFA) 108 (90 Base) MCG/ACT inhaler Inhale 1-2 puffs into the lungs every 6 (six) hours as needed for wheezing or shortness of breath. 18 g 2  . albuterol (PROVENTIL) (2.5 MG/3ML) 0.083% nebulizer solution Use one vial in the nebulizer every 4-6 hours if needed for cough or wheeze 225 mL 11  . ALPRAZolam (XANAX) 0.5 MG tablet TAKE 1 TABLET (0.5 MG TOTAL) BY MOUTH 3 (THREE) TIMES DAILY AS NEEDED. 90 tablet 1  . ammonium lactate (LAC-HYDRIN) 12 % lotion APPLY TOPICALLY AS DIRECTED. 400 mL 3  . budesonide-formoterol (SYMBICORT) 160-4.5 MCG/ACT inhaler INHALE 2 PUFFS INTO THE LUNGS 2 (TWO) TIMES DAILY. 3 Inhaler 0  . buPROPion (WELLBUTRIN SR) 150 MG 12 hr tablet Take 1 tablet (150 mg total) by mouth 2 (two) times daily. 180 tablet 3  . carvedilol (COREG) 25 MG tablet TAKE 1 TAB BY MOUTH 2 TIMES DAILY WITH A MEAL.ANNUAL APPT IN FEBUARY MUST SEE PROVIDER FOR REFILLS 180 tablet 3  .  cetirizine (ZYRTEC) 10 MG tablet Take 1 tablet (10 mg total) by mouth daily. 30 tablet 11  . Cholecalciferol (VITAMIN D3) 5000 UNITS CAPS Take one tablet by mouth daily    . clopidogrel (PLAVIX) 75 MG tablet TAKE 1 TABLET BY MOUTH EVERY DAY 90 tablet 1  . clotrimazole-betamethasone (LOTRISONE) cream APPLY AS DIRECTED 45 g 11  . Coenzyme Q10 (COQ10) 100 MG CAPS Take as directed 30 each 11  . ezetimibe (ZETIA) 10 MG tablet TAKE 1 TABLET BY MOUTH EVERY DAY 90 tablet 3  . hydrALAZINE (APRESOLINE) 50 MG tablet Take 1 tablet (50 mg total) by mouth 4 (four) times daily. 360 tablet 3  . metFORMIN (GLUCOPHAGE) 500 MG tablet TAKE 1 TABLET BY MOUTH EVERY DAY WITH BREAKFAST 90 tablet 3  . montelukast (SINGULAIR) 10 MG tablet TAKE 1 TABLET BY MOUTH EVERYDAY AT BEDTIME 90 tablet 3  . nitroGLYCERIN (NITROSTAT) 0.4 MG SL tablet PLACE 1 TABLET UNDER THE TONGUE EVERY 5 MINUTES AS NEEDED. FOR UP TO 3 DOSES. 25 tablet 2  . pravastatin (PRAVACHOL) 40 MG tablet TAKE 1 TABLET BY MOUTH EVERY DAY 90 tablet 3  . pregabalin (LYRICA) 50 MG capsule TAKE 1 CAPSULE BY MOUTH TWICE A DAY 60 capsule 5  . sildenafil (VIAGRA) 100 MG tablet TAKE 1 TABLET BY MOUTH AS NEEDED FOR ERECTILE DYSFUNCTION. 30 tablet 3  . vitamin C (ASCORBIC ACID) 500 MG tablet Take 500 mg by mouth daily.      No current facility-administered medications on file prior to visit.    Allergies:   Allergies  Allergen Reactions  . Ace Inhibitors Swelling  . Angiotensin Receptor Blockers Swelling  . Aspirin Hives  . Amlodipine     Leg swelling  . Crestor [Rosuvastatin Calcium]     myalgias  . Lipitor [Atorvastatin Calcium]     arthralgias  . Codeine Nausea And Vomiting  . Irbesartan Other (See Comments)    REACTION: allergic to ARBs w/ angioedema  . Ramipril Other (See Comments)    REACTION: Allergic to ACE's w/ angioedema...    Physical Exam Today's Vitals   06/19/20 1111  BP: 135/66  Pulse: (!) 51  Weight: 204 lb (92.5 kg)  Height: 5\' 9"   (1.753 m)   Body mass index is 30.13 kg/m.   General: well developed, well nourished pleasant  elderly Caucasian male, seated, in no evident distress Head: head normocephalic and atraumatic.   Neck: supple with no carotid or supraclavicular bruits Cardiovascular: regular rate and rhythm, no murmurs Musculoskeletal: no deformity Skin:  no rash/petichiae Vascular:  Normal pulses all extremities  Neurologic Exam Mental Status: Awake and fully alert.  Fluent speech and language with only occasional slurred speech with prolonged conversation or increased rate of speech.  Oriented to place and time. Recent memory subjectively impaired and remote memory intact. Attention span, concentration and fund of knowledge appropriate during visit. Mood and affect appropriate.  Cranial Nerves: Pupils equal, briskly reactive to light. Extraocular movements full without nystagmus. Visual fields full to confrontation. Hearing intact. Facial sensation intact.  Mild right nasolabial fold flattening.  Tongue, palate moves normally and symmetrically.  Motor: Normal bulk and tone. Normal strength in all tested extremity muscles. Sensory.: intact to touch , pinprick , position and vibratory sensation but paresthesias in the right face and hand..  Coordination: Rapid alternating movements normal in all extremities except slightly decreased right hand dexterity. Finger-to-nose and heel-to-shin performed accurately bilaterally. Gait and Station: Arises from chair without difficulty. Stance is normal. Gait demonstrates normal stride length and balance without use of assistive device.  Moderate difficulty with heel, toe and tandem walk.  Romberg negative. Reflexes: 1+ and symmetric. Toes downgoing.        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  Vascular risk factors of hypertension hyperlipidemia coronary artery disease.  Reports since initiating pregabalin for paresthesias he has been noticing fluctuation of slurred speech and gait difficulty which previously resolved as well as cognitive complaints     Fluctuation of prior stroke symptoms -Started 2 weeks after starting pregabalin 50 mg twice daily - currently on low-dose for short duration therefore recommend discontinuing -no indication for taper but did discuss possible withdrawal symptoms and to monitor -Unable to appreciate new focal deficits on today's exam -discussed further evaluation with repeating MRI but as this would likely not change her current treatment plan, he wishes to hold off as he is hopeful he will improve once Lyrica is discontinued -Discussed importance of calling 911 immediately with any new or significant worsening stroke/TIA symptoms -Discussed possibly of restarting therapies but he would like to hold off at this time   L thalamic stroke -Residual deficit right hand and face paresthesias, right nasolabial fold flattening and fluctuation of dysarthria and gait difficulty.   -we will hold off on further treatment options for paresthesias  -May consider trialing antidepressant such as duloxetine or amitriptyline in the future as this may also benefit with reported worsening depression poststroke  -Encouraged continued exercises at home and to call if he wishes to restart any therapies -Continue Plavix 75 mg daily, pravastatin and Zetia for secondary stroke prevention -Discussed secondary stroke prevention measures and importance of close PCP follow-up for aggressive stroke risk factor management -HTN: BP goal<130/90.  Stable today monitored by PCP/cardiology -HLD: LDL goal<70. On pravastatin and zetia per PCP -DM: A1c goal<7.0.  A1c 5.6 (02/2020) on Metformin per PCP    Follow-up in 3  months or call earlier if needed   CC:  [facility] provider: Dr. Hilton Cork, Evie Lacks, MD   I spent 35 minutes of face-to-face and non-face-to-face time with patient.  This included previsit chart review,  lab review, study review, order entry, electronic health record documentation, patient education and discussion regarding fluctuation of prior stroke symptoms and possible causes and further evaluation, history of prior stroke with residual deficits, importance of managing stroke risk factors and answered all other questions to patient satisfaction  Frann Rider, [facility]  [facility] [address] [place], [place] [address]  Phone [phone] Fax [phone] Note: This document was prepared with digital dictation and possible smart phrase technology. Any transcriptional errors that result from this process are unintentional.

## 2020-06-19 NOTE — Progress Notes (Deleted)
[facility] [address] [place]. [place] [address] [phone]       OFFICE CONSULT NOTE  [name] Date of Birth:  14-May-1950 Medical Record Number:  [id number] Take care: Referring MD: Larry Stone  Reason for Referral: Stroke  HPI:   Today, 06/19/2020, Larry Stone returns for stroke follow-up after prior visit approximately 5 months ago with Dr. Leonie Stone.  Reports residual *** On pregabalin 50 mg twice daily paresthesias Unable to tolerate topiramate Denies new stroke/TIA symptoms  Reports compliance on Plavix and pravastatin -denies side effects Blood pressure today ***       History provided for reference purposes only Initial visit 02/06/2020 Dr. Leonie Stone: Larry Stone is a pleasant 70 year old Caucasian male with past medical history of hypertension, hyper lipidemia, diabetes, congestive heart failure and coronary artery disease who developed sudden onset of right lower face and hand numbness as well as imbalance and difficulty walking on 12/03/2019.  He did not seek medical help the same day as if waited for it to get better.  Next day he went to the hospital but got tired waiting in the ER since he was not seen and instead went to see his primary physician Dr. Alain Stone who ordered an outpatient stroke work-up.  MRI scan of the brain done on 12/20/2019 shows a subacute left thalamic lacunar infarct.  Carotid ultrasound on 12/10/2019 showed no significant extracranial stenosis.  Echocardiogram on 12/28/2018 showed slightly diminished ejection fraction of 45 to 50% but no clot or definite cardiac source of embolism was noted.  He underwent 30 days of outpatient telemetry cardiac monitoring which was negative for atrial fibrillation or significant arrhythmias.  Carotid ultrasound on 12/10/2019 was unremarkable.  LDL cholesterol was 78 mg percent on 12/26/2019.  Patient is presently on Plavix 75 mg daily which is tolerating well without bruising or bleeding.  He  states his gait and balance have improved and he has finished outpatient physical and occupational therapy.  He still stumbles occasionally but as long to walk slowly and carefully.  He has had no falls or injuries.  He however still has persistent paresthesias in his right face and right hand which are bothersome and he would like to try some medications for this.  He has had no recurrent stroke or TIA symptoms.  States his blood pressure is well controlled and today it is 113/65 in office.  States his sugars are also doing well however he cannot tell me when his last hemoglobin A1c was checked.  He has been scheduled to undergo loop recorder pending my consultation visit today.  ROS:   14 system review of systems is positive for numbness, tingling, decreased hearing, poor balance, gait difficulty and all other systems negative  PMH:  Past Medical History:  Diagnosis Date  . Anxiety   . CAD    a. acute inferior lateral wall infarction in September 2004 treated medically. b.  cath 06/17/16 showing mild nonobstructive CAD with 30-40% ostial LM (eccentric), elevated LV filling pressures and normal LV function.  . Chronic diastolic CHF (congestive heart failure) ([place])   . CKD (chronic kidney disease), stage II   . COPD (chronic obstructive pulmonary disease) ([place])   . Diabetes mellitus   . Diverticulosis   . DJD (degenerative joint disease)   . GERD (gastroesophageal reflux disease)   . HTN (hypertension)   . Hyperlipidemia   . Low back pain syndrome   . Myocardial infarction ([place]) 2004  . Obesity   . OSA (  obstructive sleep apnea)   . RBBB   . Recurrent aspiration bronchitis/pneumonia   . Recurrent aspiration pneumonia ([place])    [name] 07/13/2016  . Thrombocytopenia ([place])   . Tubular adenoma of colon 2014    Social History:  Social History   Socioeconomic History  . Marital status: Married    Spouse name: Not on file  . Number of children: 1  . Years of education: Not on file  .  Highest education level: Not on file  Occupational History  . Occupation: Probation officer of [employer]  Tobacco Use  . Smoking status: Former Smoker    Packs/day: 1.00    Years: 20.00    Pack years: 20.00    Types: Cigarettes, Cigars    Quit date: 05/08/1998    Years since quitting: 22.1  . Smokeless tobacco: Never Used  . Tobacco comment: quit in 2005  Vaping Use  . Vaping Use: Never used  Substance and Sexual Activity  . Alcohol use: Yes    Alcohol/week: 1.0 standard drink    Types: 1 Standard drinks or equivalent per week    Comment: social drinker  . Drug use: No  . Sexual activity: Yes  Other Topics Concern  . Not on file  Social History Narrative   Lives with spouse only   Right Handed   Drinks <1 cup caffeine daily   Social Determinants of Health   Financial Resource Strain: Low Risk   . Difficulty of Paying Living Expenses: Not hard at all  Food Insecurity: No Food Insecurity  . Worried About Charity fundraiser in the Last Year: Never true  . Ran Out of Food in the Last Year: Never true  Transportation Needs: No Transportation Needs  . Lack of Transportation (Medical): No  . Lack of Transportation (Non-Medical): No  Physical Activity: Inactive  . Days of Exercise per Week: 0 days  . Minutes of Exercise per Session: 0 min  Stress: No Stress Concern Present  . Feeling of Stress : Not at all  Social Connections: Unknown  . Frequency of Communication with Friends and Family: More than three times a week  . Frequency of Social Gatherings with Friends and Family: More than three times a week  . Attends Religious Services: Patient refused  . Active Member of Clubs or Organizations: Patient refused  . Attends Archivist Meetings: Patient refused  . Marital Status: Patient refused  Intimate Partner Violence: Not on file    Medications:   Current Outpatient Medications on File Prior to Visit  Medication Sig Dispense Refill  . albuterol (PROAIR  HFA) 108 (90 Base) MCG/ACT inhaler Inhale 1-2 puffs into the lungs every 6 (six) hours as needed for wheezing or shortness of breath. 18 g 2  . albuterol (PROVENTIL) (2.5 MG/3ML) 0.083% nebulizer solution Use one vial in the nebulizer every 4-6 hours if needed for cough or wheeze 225 mL 11  . ALPRAZolam (XANAX) 0.5 MG tablet TAKE 1 TABLET (0.5 MG TOTAL) BY MOUTH 3 (THREE) TIMES DAILY AS NEEDED. 90 tablet 1  . ammonium lactate (LAC-HYDRIN) 12 % lotion APPLY TOPICALLY AS DIRECTED. 400 mL 3  . budesonide-formoterol (SYMBICORT) 160-4.5 MCG/ACT inhaler INHALE 2 PUFFS INTO THE LUNGS 2 (TWO) TIMES DAILY. 3 Inhaler 0  . buPROPion (WELLBUTRIN SR) 150 MG 12 hr tablet Take 1 tablet (150 mg total) by mouth 2 (two) times daily. 180 tablet 3  . carvedilol (COREG) 25 MG tablet TAKE 1 TAB BY MOUTH 2 TIMES DAILY  WITH A MEAL.ANNUAL APPT IN FEBUARY MUST SEE PROVIDER FOR REFILLS 180 tablet 3  . cetirizine (ZYRTEC) 10 MG tablet Take 1 tablet (10 mg total) by mouth daily. 30 tablet 11  . Cholecalciferol (VITAMIN D3) 5000 UNITS CAPS Take one tablet by mouth daily    . clopidogrel (PLAVIX) 75 MG tablet TAKE 1 TABLET BY MOUTH EVERY DAY 90 tablet 1  . clotrimazole-betamethasone (LOTRISONE) cream APPLY AS DIRECTED 45 g 11  . Coenzyme Q10 (COQ10) 100 MG CAPS Take as directed 30 each 11  . ezetimibe (ZETIA) 10 MG tablet TAKE 1 TABLET BY MOUTH EVERY DAY 90 tablet 3  . hydrALAZINE (APRESOLINE) 50 MG tablet Take 1 tablet (50 mg total) by mouth 4 (four) times daily. 360 tablet 3  . metFORMIN (GLUCOPHAGE) 500 MG tablet TAKE 1 TABLET BY MOUTH EVERY DAY WITH BREAKFAST 90 tablet 3  . montelukast (SINGULAIR) 10 MG tablet TAKE 1 TABLET BY MOUTH EVERYDAY AT BEDTIME 90 tablet 3  . nitroGLYCERIN (NITROSTAT) 0.4 MG SL tablet PLACE 1 TABLET UNDER THE TONGUE EVERY 5 MINUTES AS NEEDED. FOR UP TO 3 DOSES. 25 tablet 2  . pravastatin (PRAVACHOL) 40 MG tablet TAKE 1 TABLET BY MOUTH EVERY DAY 90 tablet 3  . pregabalin (LYRICA) 50 MG capsule TAKE 1  CAPSULE BY MOUTH TWICE A DAY 60 capsule 5  . sildenafil (VIAGRA) 100 MG tablet TAKE 1 TABLET BY MOUTH AS NEEDED FOR ERECTILE DYSFUNCTION. 30 tablet 3  . vitamin C (ASCORBIC ACID) 500 MG tablet Take 500 mg by mouth daily.      No current facility-administered medications on file prior to visit.    Allergies:   Allergies  Allergen Reactions  . Ace Inhibitors Swelling  . Angiotensin Receptor Blockers Swelling  . Aspirin Hives  . Amlodipine     Leg swelling  . Crestor [Rosuvastatin Calcium]     myalgias  . Lipitor [Atorvastatin Calcium]     arthralgias  . Codeine Nausea And Vomiting  . Irbesartan Other (See Comments)    REACTION: allergic to ARBs w/ angioedema  . Ramipril Other (See Comments)    REACTION: Allergic to ACE's w/ angioedema...    Physical Exam There were no vitals filed for this visit. There is no height or weight on file to calculate BMI.   General: well developed, well nourished middle-aged Caucasian male, seated, in no evident distress Head: head normocephalic and atraumatic.   Neck: supple with no carotid or supraclavicular bruits Cardiovascular: regular rate and rhythm, no murmurs Musculoskeletal: no deformity Skin:  no rash/petichiae Vascular:  Normal pulses all extremities  Neurologic Exam Mental Status: Awake and fully alert. Oriented to place and time. Recent and remote memory intact. Attention span, concentration and fund of knowledge appropriate. Mood and affect appropriate.  Cranial Nerves: Pupils equal, briskly reactive to light. Extraocular movements full without nystagmus. Visual fields full to confrontation. Hearing intact. Facial sensation intact. Face, tongue, palate moves normally and symmetrically.  Motor: Normal bulk and tone. Normal strength in all tested extremity muscles. Sensory.: intact to touch , pinprick , position and vibratory sensation but paresthesias in the right face and hand..  Coordination: Rapid alternating movements normal in  all extremities. Finger-to-nose and heel-to-shin performed accurately bilaterally. Gait and Station: Arises from chair without difficulty. Stance is normal. Gait demonstrates normal stride length and balance . Able to heel, toe and tandem walk with moderate  difficulty.  Reflexes: 1+ and symmetric. Toes downgoing.        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  Vascular risk factors of hypertension hyperlipidemia coronary artery disease.     L thalamic stroke -Residual deficit ***  -On Lyrica 50 mg twice daily  -Intolerant to topiramate -Continue Plavix 75 mg daily   -Discussed secondary stroke prevention measures and importance of close PCP follow-up for aggressive stroke risk factor management -HTN: BP goal<130/90 -HLD: LDL goal<70       CC:  [facility] provider: Plotnikov, Evie Lacks, MD   I spent *** minutes of face-to-face and non-face-to-face time with patient.  This included previsit chart review, lab review, study review, order entry, electronic health record documentation, patient education  Frann Rider, [facility]  [facility] [address] [place], [place] [address]  Phone [phone] Fax [phone] Note: This document was prepared with digital dictation and possible smart phrase technology. Any transcriptional errors that result from this process are unintentional.

## 2020-08-05 ENCOUNTER — Ambulatory Visit: Payer: Medicare Other

## 2020-08-06 ENCOUNTER — Other Ambulatory Visit: Payer: Self-pay | Admitting: Internal Medicine

## 2020-08-11 ENCOUNTER — Other Ambulatory Visit: Payer: Self-pay | Admitting: Cardiovascular Disease

## 2020-08-18 ENCOUNTER — Ambulatory Visit: Payer: Medicare Other | Admitting: Family Medicine

## 2020-08-18 DIAGNOSIS — M79604 Pain in right leg: Secondary | ICD-10-CM | POA: Diagnosis not present

## 2020-08-18 NOTE — Progress Notes (Signed)
Office Visit Note   Patient: Larry Stone           Date of Birth: June 01, 1950           MRN: [id number] Visit Date: 08/18/2020 Requested by: Cassandria Anger, MD [place],  [place] [address] PCP: Plotnikov, Evie Lacks, MD  Subjective: Chief Complaint  Patient presents with  . Right Thigh - Pain    Intermittent pain in the posterior thigh x 3 months. The only possible cause he can think of is a fall on the ice in January - landed on his buttocks, had some pain in the leg afterward. Got better. Pain "cramping, pressure" in the posterior thigh with sitting (esp driving). Tried voltaren gel and sitting on different cushions - no help. Heat patches help some. Getting up and walking around helps the pain to dissipate.    HPI: He is here with right posterior thigh pain.  Symptoms started about 3 months ago, no definite injury.  He recalls falling in January on ice and landing on his buttocks, but does not remember having this pain after that.  He had better after those injuries, and it was not until 3 months ago that he started having this intermittent pain.  Pain occurs in the hamstring area, and leads to cramping of the entire posterior thigh.  It happens consistently when driving, after about 10 or 15 minutes.  He often has to stop and get out of the car.  He walks with a limp when he first gets out of the car, but then the pain improves and he can walk without any trouble.  The pain does not occur when lying down.  Pain does not radiate below the knee.  No numbness or tingling in his leg.  No history of DVT and he is on Plavix chronically for anticoagulation ever since having a heart attack.  Denies any low back pain.                ROS:   All other systems were reviewed and are negative.  Objective: Vital Signs: There were no vitals taken for this visit.  Physical Exam:  General:  Alert and oriented, in no acute distress. Pulm:  Breathing unlabored. Psy:  Normal mood,  congruent affect. Skin: No rash Right leg: He has tight hamstrings and the lateral muscle belly is tender to palpation.  Straight leg raise is negative.  He has mild pain with flexion of the knee against resistance but his strength in the lower extremity is normal.  Stork test is equivocal.   Imaging: No results found.  Assessment & Plan: 1.  Right posterior thigh pain, possibilities include hamstring muscle myofascial pain versus referred pain from lumbar foraminal stenosis. -We will try physical therapy.  If he fails to improve, then possibly further investigation for nerve impingement including x-rays of lumbar spine.     Procedures: No procedures performed        PMFS History: Patient Active Problem List   Diagnosis Date Noted  . Neck pain on left side 02/28/2020  . Allergic rhinitis 12/26/2019  . Cerebrovascular accident (CVA) of left thalamus ([place]) 12/05/2019  . Right hemiparesis ([place]) 12/04/2019  . Chronic renal insufficiency, stage 3 (moderate) ([place]) 07/05/2019  . Mild cognitive disorder 10/12/2017  . Viral pneumonitis 07/22/2016  . COPD with asthma ([place]) 07/22/2016  . Depression with anxiety 07/13/2016  . Acute respiratory failure with hypoxia ([place]) 07/13/2016  . Chronic diastolic CHF (congestive  heart failure) ([place]) 07/13/2016  . Sepsis, unspecified organism ([place]) 07/13/2016  . Acute pain of right knee 06/30/2016  . Fatigue 06/30/2016  . Creatinine elevation 06/30/2016  . Dyspnea on exertion   . COPD with acute exacerbation ([place]) 04/04/2015  . Elevated IgE level 03/18/2014  . Well adult exam 03/05/2014  . COPD with chronic bronchitis ([place]) 12/13/2013  . Venous insufficiency 11/03/2011  . Edema 11/03/2011  . Memory disturbance 05/13/2011  . CAD (coronary artery disease) 09/17/2010  . 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 Incidental pulmonary nodule 06/17/2009  . COLONIC POLYPS 10/17/2007  . BRONCHITIS, RECURRENT 10/17/2007  . GERD (gastroesophageal reflux  disease) 10/17/2007  . Diverticulosis of large intestine 10/17/2007  . DEGENERATIVE JOINT DISEASE 10/17/2007  . Hyperglycemia 10/17/2007  . Dyslipidemia 10/02/2007  . Anxiety state 10/02/2007  . Obstructive sleep apnea 10/02/2007  . Essential hypertension 10/02/2007  . LOW BACK PAIN SYNDROME 10/02/2007   Past Medical History:  Diagnosis Date  . Anxiety   . CAD    a. acute inferior lateral wall infarction in September 2004 treated medically. b.  cath 06/17/16 showing mild nonobstructive CAD with 30-40% ostial LM (eccentric), elevated LV filling pressures and normal LV function.  . Chronic diastolic CHF (congestive heart failure) ([place])   . CKD (chronic kidney disease), stage II   . COPD (chronic obstructive pulmonary disease) ([place])   . Diabetes mellitus   . Diverticulosis   . DJD (degenerative joint disease)   . GERD (gastroesophageal reflux disease)   . HTN (hypertension)   . Hyperlipidemia   . Low back pain syndrome   . Myocardial infarction ([place]) 2004  . Obesity   . OSA (obstructive sleep apnea)   . RBBB   . Recurrent aspiration bronchitis/pneumonia   . Recurrent aspiration pneumonia ([place])    [name] 07/13/2016  . Thrombocytopenia ([place])   . Tubular adenoma of colon 2014    Family History  Problem Relation Age of Onset  . Colon cancer Father   . Stroke Mother   . Heart disease Mother   . Hypertension Sister   . Diabetes Sister   . Colon cancer Paternal Uncle     Past Surgical History:  Procedure Laterality Date  . CARDIAC CATHETERIZATION    . LEFT HEART CATH AND CORONARY ANGIOGRAPHY N/A 06/17/2016   Procedure: Left Heart Cath and Coronary Angiography;  Surgeon: Burnell Blanks, MD;  Location: [facility];  Service: Cardiovascular;  Laterality: N/A;  . UVULOPALATOPHARYNGOPLASTY     surgery for OSA   Social History   Occupational History  . Occupation: Probation officer of [employer]  Tobacco Use  . Smoking status: Former Smoker    Packs/day: 1.00     Years: 20.00    Pack years: 20.00    Types: Cigarettes, Cigars    Quit date: 05/08/1998    Years since quitting: 22.2  . Smokeless tobacco: Never Used  . Tobacco comment: quit in 2005  Vaping Use  . Vaping Use: Never used  Substance and Sexual Activity  . Alcohol use: Yes    Alcohol/week: 1.0 standard drink    Types: 1 Standard drinks or equivalent per week    Comment: social drinker  . Drug use: No  . Sexual activity: Yes

## 2020-08-19 ENCOUNTER — Other Ambulatory Visit: Payer: Self-pay

## 2020-08-19 ENCOUNTER — Ambulatory Visit: Payer: Medicare Other | Attending: Family Medicine

## 2020-08-19 DIAGNOSIS — M6281 Muscle weakness (generalized): Secondary | ICD-10-CM | POA: Insufficient documentation

## 2020-08-19 DIAGNOSIS — R2681 Unsteadiness on feet: Secondary | ICD-10-CM | POA: Diagnosis present

## 2020-08-19 DIAGNOSIS — M5417 Radiculopathy, lumbosacral region: Secondary | ICD-10-CM | POA: Diagnosis present

## 2020-08-19 DIAGNOSIS — R2689 Other abnormalities of gait and mobility: Secondary | ICD-10-CM | POA: Diagnosis present

## 2020-08-19 NOTE — Patient Instructions (Addendum)
Access Code: [email or web address] URL: [email or web address] Date: 08/19/2020 Prepared by: Sharlynn Oliphant  Exercises Supine Piriformis Stretch with Foot on Ground - 2 x daily - 7 x weekly - 1 sets - 2 reps - 30s hold  Patient Education Piriformis Syndrome

## 2020-08-19 NOTE — Therapy (Signed)
[facility] [address] [place], [place], [address] Phone: [phone]   Fax:  [phone]  Physical Therapy Evaluation  Patient Details  Name: Larry Stone MRN: [id number] Date of Birth: 1951-01-17 Referring Provider (PT): Larry Blase MD   Encounter Date: 08/19/2020   PT End of Session - 08/19/20 1325    Visit Number 1    Number of Visits 7    Date for PT Re-Evaluation 10/21/20    Authorization Type U[employer]    PT Start Time [id number]    PT Stop Time 1400    PT Time Calculation (min) 45 min           Past Medical History:  Diagnosis Date  . Anxiety   . CAD    a. acute inferior lateral wall infarction in September 2004 treated medically. b.  cath 06/17/16 showing mild nonobstructive CAD with 30-40% ostial LM (eccentric), elevated LV filling pressures and normal LV function.  . Chronic diastolic CHF (congestive heart failure) ([place])   . CKD (chronic kidney disease), stage II   . COPD (chronic obstructive pulmonary disease) ([place])   . Diabetes mellitus   . Diverticulosis   . DJD (degenerative joint disease)   . GERD (gastroesophageal reflux disease)   . HTN (hypertension)   . Hyperlipidemia   . Low back pain syndrome   . Myocardial infarction (Larry Stone) 2004  . Obesity   . OSA (obstructive sleep apnea)   . RBBB   . Recurrent aspiration bronchitis/pneumonia   . Recurrent aspiration pneumonia ([place])    Larry Stone 07/13/2016  . Thrombocytopenia ([place])   . Tubular adenoma of colon 2014    Past Surgical History:  Procedure Laterality Date  . CARDIAC CATHETERIZATION    . LEFT HEART CATH AND CORONARY ANGIOGRAPHY N/A 06/17/2016   Procedure: Left Heart Cath and Coronary Angiography;  Surgeon: Burnell Blanks, MD;  Location: [facility];  Service: Cardiovascular;  Laterality: N/A;  . UVULOPALATOPHARYNGOPLASTY     surgery for OSA    There were no vitals filed for this visit.    Subjective Assessment - 08/19/20 1320     Subjective describes R posterior leg pain extending to knee but not beyond, denies paresthesias, feels hamstrings are cramping, pain relieved with position change and restarts when sitting    Pertinent History Symptoms started about 3 months ago, no definite injury.  He recalls falling in January on ice and landing on his buttocks, but does not remember having this pain after that.  He had better after those injuries, and it was not until 3 months ago that he started having this intermittent pain.  Pain occurs in the hamstring area, and leads to cramping of the entire posterior thigh.  It happens consistently when driving, after about 10 or 15 minutes.  He often has to stop and get out of the car.  He walks with a limp when he first gets out of the car, but then the pain improves and he can walk without any trouble.  The pain does not occur when lying down.  Pain does not radiate below the knee.  No numbness or tingling in his leg.  No history of DVT and he is on Plavix chronically for anticoagulation ever since having a heart attack.  Denies any low back pain.    Limitations Sitting    How long can you sit comfortably? 15 min    How long can you stand comfortably? unlimited  How long can you walk comfortably? unlimited    Currently in Pain? Yes    Pain Score 4     Pain Location Leg    Pain Orientation Right    Pain Type Acute pain    Pain Onset More than a month ago    Pain Frequency Several days a week    Aggravating Factors  siting    Pain Relieving Factors position change              Larry Stone PT Assessment - 08/19/20 0001      Assessment   Medical Diagnosis lumbosacral radiculopathy    Referring Provider (PT) Larry Hilts MD    Hand Dominance Right    Next MD Visit PRN    Prior Therapy OPPT for CVA      Precautions   Precautions None      Restrictions   Weight Bearing Restrictions No      Balance Screen   Has the patient fallen in the past 6 months No      [place] residence    Living Arrangements Spouse/significant other    Type of [place] to enter    Home Layout Two level      Prior Function   Level of Independence Independent      Sensation   Light Touch Appears Intact      Coordination   Gross Motor Movements are Fluid and Coordinated Yes    Heel Shin Test [facility]      Strength   Overall Strength Within functional limits for tasks performed      Palpation   Palpation comment point tender to R hamstrings, piriformis and SI joint      Transfers   Transfers Sit to Stand    Comments Idependent      Ambulation/Gait   Ambulation/Gait Yes    Ambulation/Gait Assistance 7: Independent    Ambulation Distance (Feet) 50 Feet    Assistive device None    Gait Pattern Decreased step length - right    Ambulation Surface Level;Indoor                      Objective measurements completed on examination: See above findings.               PT Education - 08/19/20 1447    Education Details Access Code: [email or web address]  URL: [email or web address]  Date: 08/19/2020  Prepared by: Sharlynn Oliphant    Exercises  Supine Piriformis Stretch with Foot on Ground - 2 x daily - 7 x weekly - 1 sets - 2 reps - 30s hold    Patient Education  Piriformis Syndrome    Person(s) Educated Patient    Methods Explanation;Demonstration;Tactile cues;Verbal cues;Handout    Comprehension Verbalized understanding;Returned demonstration;Need further instruction            PT Short Term Goals - 08/19/20 1459      PT SHORT TERM GOAL #1   Title Asses 5x STS time    Baseline UTA    Time 4    Period Weeks    Status New    Target Date 09/16/20             PT Long Term Goals - 08/19/20 1500      PT LONG TERM GOAL #1   Title Patient to demo HEP to PT w/o need of VCs    Baseline Initial  HEP established today    Time 6    Period Weeks    Status New    Target Date 10/07/20       PT LONG TERM GOAL #2   Title Improve and maintain pelvic alignment of suspectd posterior rotation of L ilium    Baseline Posterior L ilium rotation and    Time 6    Period Weeks    Status New    Target Date 10/07/20      PT LONG TERM GOAL #3   Title Assess 5x STS time    Baseline TBD    Time 6    Period Weeks    Status New    Target Date 10/07/20                  Plan - 08/19/20 1449    Clinical Impression Statement Patient presents with suspected posterior rotation of L ilium following traumatic fall onto L buttock jan 2022, pelvic alignment difficult to assess due to body habitus especially PSIS locations, palpation finds tenderness to and trigger points R hanstring group as well as L SI joint and piriformis.  Piriformis stretch elicited patient symptoms and included in HEP.  Patient informed of Eval findings, prognosis and POC and is in agreement    Personal Factors and Comorbidities Comorbidity 1    Comorbidities CVA residual deficits R side weakness    Examination-Activity Limitations Sit    Examination-Participation Restrictions Driving    Stability/Clinical Decision Making Stable/Uncomplicated    Clinical Decision Making Low    Rehab Potential Good    PT Frequency 1x / week    PT Duration 6 weeks    PT Treatment/Interventions ADLs/Self Care Home Management;Aquatic Therapy;Gait training;Stair training;Functional mobility training;Therapeutic activities;Balance training;Neuromuscular re-education;Therapeutic exercise;Patient/family education;Orthotic Fit/Training;Manual techniques    PT Next Visit Plan f/u with HEP, recheck pelvic alignment and symptoms    PT Home Exercise Plan [email or web address]    Consulted and Agree with Plan of Care Patient           Patient will benefit from skilled therapeutic intervention in order to improve the following deficits and impairments:  Abnormal gait,Difficulty walking,Decreased endurance,Increased muscle spasms,Decreased activity  tolerance,Decreased balance,Decreased mobility  Visit Diagnosis: Unsteadiness on feet  Other abnormalities of gait and mobility  Radiculopathy, lumbosacral region     Problem List Patient Active Problem List   Diagnosis Date Noted  . Neck pain on left side 02/28/2020  . Allergic rhinitis 12/26/2019  . Cerebrovascular accident (CVA) of left thalamus ([place]) 12/05/2019  . Right hemiparesis ([place]) 12/04/2019  . Chronic renal insufficiency, stage 3 (moderate) ([place]) 07/05/2019  . Mild cognitive disorder 10/12/2017  . Viral pneumonitis 07/22/2016  . COPD with asthma ([place]) 07/22/2016  . Depression with anxiety 07/13/2016  . Acute respiratory failure with hypoxia ([place]) 07/13/2016  . Chronic diastolic CHF (congestive heart failure) ([place]) 07/13/2016  . Sepsis, unspecified organism ([place]) 07/13/2016  . Acute pain of right knee 06/30/2016  . Fatigue 06/30/2016  . Creatinine elevation 06/30/2016  . Dyspnea on exertion   . COPD with acute exacerbation ([place]) 04/04/2015  . Elevated IgE level 03/18/2014  . Well adult exam 03/05/2014  . COPD with chronic bronchitis ([place]) 12/13/2013  . Venous insufficiency 11/03/2011  . Edema 11/03/2011  . Memory disturbance 05/13/2011  . CAD (coronary artery disease) 09/17/2010  . 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 Incidental pulmonary nodule 06/17/2009  . COLONIC POLYPS 10/17/2007  . BRONCHITIS, RECURRENT 10/17/2007  . GERD (gastroesophageal reflux disease) 10/17/2007  .  Diverticulosis of large intestine 10/17/2007  . DEGENERATIVE JOINT DISEASE 10/17/2007  . Hyperglycemia 10/17/2007  . Dyslipidemia 10/02/2007  . Anxiety state 10/02/2007  . Obstructive sleep apnea 10/02/2007  . Essential hypertension 10/02/2007  . LOW BACK PAIN SYNDROME 10/02/2007    Lanice Shirts PT 08/19/2020, 3:13 PM  [facility] [address] [place], [place], [address] Phone: [phone]   Fax:   [phone]  Name: Larry Stone MRN: [id number] Date of Birth: [birth year]

## 2020-08-25 ENCOUNTER — Ambulatory Visit: Payer: Medicare Other

## 2020-08-27 ENCOUNTER — Ambulatory Visit: Payer: Medicare Other

## 2020-08-29 ENCOUNTER — Other Ambulatory Visit: Payer: Self-pay

## 2020-08-29 ENCOUNTER — Ambulatory Visit: Payer: Medicare Other

## 2020-09-01 ENCOUNTER — Encounter: Payer: Self-pay | Admitting: Internal Medicine

## 2020-09-01 ENCOUNTER — Other Ambulatory Visit: Payer: Self-pay

## 2020-09-01 ENCOUNTER — Ambulatory Visit: Payer: Medicare Other | Admitting: Internal Medicine

## 2020-09-01 DIAGNOSIS — N183 Chronic kidney disease, stage 3 unspecified: Secondary | ICD-10-CM | POA: Diagnosis not present

## 2020-09-01 DIAGNOSIS — I639 Cerebral infarction, unspecified: Secondary | ICD-10-CM

## 2020-09-01 DIAGNOSIS — I6381 Other cerebral infarction due to occlusion or stenosis of small artery: Secondary | ICD-10-CM

## 2020-09-01 DIAGNOSIS — I10 Essential (primary) hypertension: Secondary | ICD-10-CM

## 2020-09-01 DIAGNOSIS — G8191 Hemiplegia, unspecified affecting right dominant side: Secondary | ICD-10-CM

## 2020-09-01 DIAGNOSIS — R202 Paresthesia of skin: Secondary | ICD-10-CM | POA: Insufficient documentation

## 2020-09-01 NOTE — Progress Notes (Signed)
Subjective:  Patient ID: Larry Stone, male    DOB: Jul 24, 1950  Age: 70 y.o. MRN: [id number]  CC: Follow-up (3 month f/u)   HPI Larry Stone presents for CVA, HTN, CRI f/u  Outpatient Medications Prior to Visit  Medication Sig Dispense Refill  . albuterol (PROAIR HFA) 108 (90 Base) MCG/ACT inhaler Inhale 1-2 puffs into the lungs every 6 (six) hours as needed for wheezing or shortness of breath. 18 g 2  . albuterol (PROVENTIL) (2.5 MG/3ML) 0.083% nebulizer solution Use one vial in the nebulizer every 4-6 hours if needed for cough or wheeze 225 mL 11  . ALPRAZolam (XANAX) 0.5 MG tablet TAKE 1 TABLET (0.5 MG TOTAL) BY MOUTH 3 (THREE) TIMES DAILY AS NEEDED. 90 tablet 1  . ammonium lactate (LAC-HYDRIN) 12 % lotion APPLY TOPICALLY AS DIRECTED. 400 mL 3  . budesonide-formoterol (SYMBICORT) 160-4.5 MCG/ACT inhaler INHALE 2 PUFFS INTO THE LUNGS 2 (TWO) TIMES DAILY. 3 Inhaler 0  . buPROPion (WELLBUTRIN SR) 150 MG 12 hr tablet Take 1 tablet (150 mg total) by mouth 2 (two) times daily. 180 tablet 3  . carvedilol (COREG) 25 MG tablet Take 1 tablet (25 mg total) by mouth 2 (two) times daily with a meal. Annual appt due in Sept must see provider for future refills 180 tablet 1  . cetirizine (ZYRTEC) 10 MG tablet Take 1 tablet (10 mg total) by mouth daily. 30 tablet 11  . Cholecalciferol (VITAMIN D3) 5000 UNITS CAPS Take one tablet by mouth daily    . clopidogrel (PLAVIX) 75 MG tablet TAKE 1 TABLET BY MOUTH EVERY DAY 90 tablet 3  . clotrimazole-betamethasone (LOTRISONE) cream APPLY AS DIRECTED 45 g 11  . Coenzyme Q10 (COQ10) 100 MG CAPS Take as directed 30 each 11  . ezetimibe (ZETIA) 10 MG tablet TAKE 1 TABLET BY MOUTH EVERY DAY 90 tablet 3  . hydrALAZINE (APRESOLINE) 50 MG tablet Take 1 tablet (50 mg total) by mouth 4 (four) times daily. 360 tablet 3  . metFORMIN (GLUCOPHAGE) 500 MG tablet TAKE 1 TABLET BY MOUTH EVERY DAY WITH BREAKFAST 90 tablet 3  . montelukast (SINGULAIR) 10 MG tablet TAKE 1  TABLET BY MOUTH EVERYDAY AT BEDTIME 90 tablet 3  . nitroGLYCERIN (NITROSTAT) 0.4 MG SL tablet PLACE 1 TABLET UNDER THE TONGUE EVERY 5 MINUTES AS NEEDED. FOR UP TO 3 DOSES. 25 tablet 2  . pravastatin (PRAVACHOL) 40 MG tablet TAKE 1 TABLET BY MOUTH EVERY DAY 90 tablet 3  . vitamin C (ASCORBIC ACID) 500 MG tablet Take 500 mg by mouth daily.     . sildenafil (VIAGRA) 100 MG tablet TAKE 1 TABLET BY MOUTH AS NEEDED FOR ERECTILE DYSFUNCTION. 30 tablet 3   No facility-administered medications prior to visit.    ROS: Review of Systems  Constitutional: Negative for appetite change, fatigue and unexpected weight change.  HENT: Negative for congestion, nosebleeds, sneezing, sore throat and trouble swallowing.   Eyes: Negative for itching and visual disturbance.  Respiratory: Negative for cough.   Cardiovascular: Negative for chest pain, palpitations and leg swelling.  Gastrointestinal: Negative for abdominal distention, blood in stool, diarrhea and nausea.  Genitourinary: Negative for frequency and hematuria.  Musculoskeletal: Negative for back pain, gait problem, joint swelling and neck pain.  Skin: Negative for rash.  Neurological: Negative for dizziness, tremors, speech difficulty and weakness.  Psychiatric/Behavioral: Negative for agitation, dysphoric mood, sleep disturbance and suicidal ideas. The patient is not nervous/anxious.     Objective:  BP 138/72 (BP Location:  Left Arm)   Pulse (!) 59   Temp 97.6 F (36.4 C) (Oral)   Ht 5\' 9"  (1.753 m)   Wt 194 lb 9.6 oz (88.3 kg)   SpO2 96%   BMI 28.74 kg/m   BP Readings from Last 3 Encounters:  09/01/20 138/72  06/19/20 135/66  06/03/20 130/72    Wt Readings from Last 3 Encounters:  09/01/20 194 lb 9.6 oz (88.3 kg)  06/19/20 204 lb (92.5 kg)  06/03/20 201 lb 14.4 oz (91.6 kg)    Physical Exam Constitutional:      General: He is not in acute distress.    Appearance: He is well-developed.     Comments: NAD  Eyes:      Conjunctiva/sclera: Conjunctivae normal.     Pupils: Pupils are equal, round, and reactive to light.  Neck:     Thyroid: No thyromegaly.     Vascular: No JVD.  Cardiovascular:     Rate and Rhythm: Normal rate and regular rhythm.     Heart sounds: Normal heart sounds. No murmur heard. No friction rub. No gallop.   Pulmonary:     Effort: Pulmonary effort is normal. No respiratory distress.     Breath sounds: Normal breath sounds. No wheezing or rales.  Chest:     Chest wall: No tenderness.  Abdominal:     General: Bowel sounds are normal. There is no distension.     Palpations: Abdomen is soft. There is no mass.     Tenderness: There is no abdominal tenderness. There is no guarding or rebound.  Musculoskeletal:        General: No tenderness. Normal range of motion.     Cervical back: Normal range of motion.  Lymphadenopathy:     Cervical: No cervical adenopathy.  Skin:    General: Skin is warm and dry.     Findings: No rash.  Neurological:     Mental Status: He is alert and oriented to person, place, and time.     Cranial Nerves: No cranial nerve deficit.     Motor: No abnormal muscle tone.     Coordination: Coordination normal.     Gait: Gait normal.     Deep Tendon Reflexes: Reflexes are normal and symmetric.  Psychiatric:        Behavior: Behavior normal.        Thought Content: Thought content normal.        Judgment: Judgment normal.     Lab Results  Component Value Date   WBC 9.9 12/26/2019   HGB 14.5 12/26/2019   HCT 43.0 12/26/2019   PLT 179 12/26/2019   GLUCOSE 74 02/28/2020   CHOL 144 12/26/2019   TRIG 91 12/26/2019   HDL 49 12/26/2019   LDLCALC 78 12/26/2019   ALT 16 12/26/2019   AST 19 12/26/2019   NA 140 02/28/2020   K 3.8 02/28/2020   CL 102 02/28/2020   CREATININE 1.29 02/28/2020   BUN 21 02/28/2020   CO2 30 02/28/2020   TSH 1.91 11/29/2018   PSA 0.5 12/26/2019   INR 1.1 12/04/2019   HGBA1C 5.6 02/28/2020    MR Brain W Wo  Contrast  Result Date: 12/20/2019 CLINICAL DATA:  Acute stroke suspected. Right-sided numbness. Memory loss and confusion. EXAM: MRI HEAD WITHOUT AND WITH CONTRAST TECHNIQUE: Multiplanar, multiecho pulse sequences of the brain and surrounding structures were obtained without and with intravenous contrast. CONTRAST:  54mL GADAVIST GADOBUTROL 1 MMOL/ML IV SOLN COMPARISON:  Head CT 12/04/2019  FINDINGS: Brain: Subacute infarction of the left lateral thalamus measuring about a cm in size. No other acute or subacute insult. Chronic small-vessel ischemic changes affect the pons. Old small vessel infarction of the superior cerebellum on the right. Cerebral hemispheres elsewhere show minimal small vessel change of the white matter. No large vessel territory infarction. No mass lesion, hemorrhage, hydrocephalus or extra-axial collection. After contrast administration, there is mild enhancement the left thalamic stroke. Vascular: Major vessels at the base of the brain show flow. Skull and upper cervical spine: Negative Sinuses/Orbits: Mucosal inflammatory changes of the paranasal sinuses, most pronounced in the right maxillary sinus. Orbits negative. Other: None IMPRESSION: 1. Subacute infarction of the left lateral thalamus measuring about a cm in size. No evidence of hemorrhage or mass effect. Some contrast enhancement present in this location, as often seen and subacute infarctions. 2. Chronic small-vessel ischemic changes elsewhere throughout the brain as outlined above. 3. Mucosal inflammatory changes of the paranasal sinuses, most pronounced in the right maxillary sinus. Electronically Signed   By: Nelson Chimes M.D.   On: 12/20/2019 08:44    Assessment & Plan:    [name], MD

## 2020-09-01 NOTE — Assessment & Plan Note (Signed)
Coreg, Hydralazine 

## 2020-09-01 NOTE — Patient Instructions (Addendum)
Try CBD gummies ?

## 2020-09-01 NOTE — Assessment & Plan Note (Signed)
On Plavix, Pravastatin ?

## 2020-09-01 NOTE — Assessment & Plan Note (Addendum)
Better Strength is ok, R hand and face paresthesia - Try CBD gummies

## 2020-09-01 NOTE — Assessment & Plan Note (Signed)
Post CVA Strength is ok, R hand and face paresthesia - Try CBD gummies

## 2020-09-01 NOTE — Assessment & Plan Note (Signed)
Dr Carolin Sicks Cont w/Coreg, Hydralazine

## 2020-09-03 ENCOUNTER — Ambulatory Visit: Payer: Medicare Other

## 2020-09-03 ENCOUNTER — Other Ambulatory Visit: Payer: Self-pay

## 2020-09-03 DIAGNOSIS — R2681 Unsteadiness on feet: Secondary | ICD-10-CM

## 2020-09-03 DIAGNOSIS — M6281 Muscle weakness (generalized): Secondary | ICD-10-CM

## 2020-09-03 DIAGNOSIS — R2689 Other abnormalities of gait and mobility: Secondary | ICD-10-CM

## 2020-09-03 DIAGNOSIS — M5417 Radiculopathy, lumbosacral region: Secondary | ICD-10-CM

## 2020-09-03 NOTE — Therapy (Signed)
[facility] [address] [place], [place], [address] Phone: [phone]   Fax:  [phone]  Physical Therapy Treatment  Patient Details  Name: Larry Stone MRN: [id number] Date of Birth: Apr 06, 1951 Referring Provider (PT): Eunice Blase MD   Encounter Date: 09/03/2020   PT End of Session - 09/03/20 1431    Visit Number 2    Number of Visits 7    Date for PT Re-Evaluation 10/21/20    Authorization Type UHC    Progress Note Due on Visit 10    PT Start Time 1230    PT Stop Time 1315    PT Time Calculation (min) 45 min    Equipment Utilized During Treatment Gait belt    Activity Tolerance Patient tolerated treatment well    Behavior During Therapy [facility] for tasks assessed/performed           Past Medical History:  Diagnosis Date  . Anxiety   . CAD    a. acute inferior lateral wall infarction in September 2004 treated medically. b.  cath 06/17/16 showing mild nonobstructive CAD with 30-40% ostial LM (eccentric), elevated LV filling pressures and normal LV function.  . Chronic diastolic CHF (congestive heart failure) ([place])   . CKD (chronic kidney disease), stage II   . COPD (chronic obstructive pulmonary disease) ([place])   . Diabetes mellitus   . Diverticulosis   . DJD (degenerative joint disease)   . GERD (gastroesophageal reflux disease)   . HTN (hypertension)   . Hyperlipidemia   . Low back pain syndrome   . Myocardial infarction ([place]) 2004  . Obesity   . OSA (obstructive sleep apnea)   . RBBB   . Recurrent aspiration bronchitis/pneumonia   . Recurrent aspiration pneumonia ([place])    [name] 07/13/2016  . Thrombocytopenia ([place])   . Tubular adenoma of colon 2014    Past Surgical History:  Procedure Laterality Date  . CARDIAC CATHETERIZATION    . LEFT HEART CATH AND CORONARY ANGIOGRAPHY N/A 06/17/2016   Procedure: Left Heart Cath and Coronary Angiography;  Surgeon: Burnell Blanks, MD;  Location: [place] [facility];  Service: Cardiovascular;  Laterality: N/A;  . UVULOPALATOPHARYNGOPLASTY     surgery for OSA    There were no vitals filed for this visit.   Subjective Assessment - 09/03/20 1427    Subjective Has been compliant with HEP and feels it help stemporarily, recently misstepped and noted some RLE adductor strain, overall improving    Pertinent History Symptoms started about 3 months ago, no definite injury.  He recalls falling in January on ice and landing on his buttocks, but does not remember having this pain after that.  He had better after those injuries, and it was not until 3 months ago that he started having this intermittent pain.  Pain occurs in the hamstring area, and leads to cramping of the entire posterior thigh.  It happens consistently when driving, after about 10 or 15 minutes.  He often has to stop and get out of the car.  He walks with a limp when he first gets out of the car, but then the pain improves and he can walk without any trouble.  The pain does not occur when lying down.  Pain does not radiate below the knee.  No numbness or tingling in his leg.  No history of DVT and he is on Plavix chronically for anticoagulation ever since having a heart attack.  Denies any low back  pain.    Limitations Sitting    How long can you sit comfortably? 15 min    How long can you stand comfortably? unlimited    How long can you walk comfortably? unlimited    Pain Onset More than a month ago                             [facility] Adult PT Treatment/Exercise - 09/03/20 0001      Transfers   Transfers Sit to Stand    Comments Idependent      Ambulation/Gait   Ambulation/Gait Yes    Ambulation/Gait Assistance 7: Independent    Ambulation Distance (Feet) 115 Feet    Assistive device None   none   Gait Pattern Step-through pattern    Ambulation Surface Level;Indoor      Self-Care   Self-Care Other Self-Care Comments    Other Self-Care Comments  discussed  etiology as wel as defined triggerpoint pathology      Lumbar Exercises: Stretches   Piriformis Stretch Right;2 reps;30 seconds    Piriformis Stretch Limitations usde towel o contralateral leg for increased stretch    Figure 4 Stretch 2 reps;30 seconds      Lumbar Exercises: Supine   Ab Set 10 reps;Limitations    AB Set Limitations curl ups 2x10    Pelvic Tilt 10 reps    Pelvic Tilt Limitations 2x10    Bridge Compliant;10 reps;Limitations    Bridge Limitations 2x10      Manual Therapy   Manual Therapy Soft tissue mobilization    Manual therapy comments Performed deep tissue work to release R piriformis trigger point in L hooklie position                    PT Short Term Goals - 08/19/20 1459      PT SHORT TERM GOAL #1   Title Asses 5x STS time    Baseline UTA    Time 4    Period Weeks    Status New    Target Date 09/16/20             PT Long Term Goals - 08/19/20 1500      PT LONG TERM GOAL #1   Title Patient to demo HEP to PT w/o need of VCs    Baseline Initial HEP established today    Time 6    Period Weeks    Status New    Target Date 10/07/20      PT LONG TERM GOAL #2   Title Improve and maintain pelvic alignment of suspectd posterior rotation of L ilium    Baseline Posterior L ilium rotation and    Time 6    Period Weeks    Status New    Target Date 10/07/20      PT LONG TERM GOAL #3   Title Assess 5x STS time    Baseline TBD    Time 6    Period Weeks    Status New    Target Date 10/07/20                 Plan - 09/03/20 1432    Clinical Impression Statement Todays skilled session focused on continued stertching and soft tissue mobilization to R piriformis, palpable knot found in R piriformis and strtching to R hip found joint limitations and mobility deficits, pelvic alignment re-assessed and found to be symmetrical, issued spine stabilization  tasks to maintain pelvic alignment    Personal Factors and Comorbidities Comorbidity 1     Comorbidities CVA residual deficits R side weakness    Examination-Activity Limitations Sit    Examination-Participation Restrictions Driving    Stability/Clinical Decision Making Stable/Uncomplicated    Rehab Potential Good    PT Frequency 1x / week    PT Duration 6 weeks    PT Treatment/Interventions ADLs/Self Care Home Management;Aquatic Therapy;Gait training;Stair training;Functional mobility training;Therapeutic activities;Balance training;Neuromuscular re-education;Therapeutic exercise;Patient/family education;Orthotic Fit/Training;Manual techniques    PT Next Visit Plan f/u with new HEP, recheck pelvic alignment and symptoms, advance stabilization exercises    PT Home Exercise Plan [email or web address]    Consulted and Agree with Plan of Care Patient           Patient will benefit from skilled therapeutic intervention in order to improve the following deficits and impairments:  Abnormal gait,Difficulty walking,Decreased endurance,Increased muscle spasms,Decreased activity tolerance,Decreased balance,Decreased mobility  Visit Diagnosis: Unsteadiness on feet  Other abnormalities of gait and mobility  Radiculopathy, lumbosacral region  Muscle weakness (generalized)     Problem List Patient Active Problem List   Diagnosis Date Noted  . Paresthesias 09/01/2020  . Neck pain on left side 02/28/2020  . Allergic rhinitis 12/26/2019  . Cerebrovascular accident (CVA) of left thalamus ([place]) 12/05/2019  . Right hemiparesis ([place]) 12/04/2019  . Chronic renal insufficiency, stage 3 (moderate) ([place]) 07/05/2019  . Mild cognitive disorder 10/12/2017  . Viral pneumonitis 07/22/2016  . COPD with asthma ([place]) 07/22/2016  . Depression with anxiety 07/13/2016  . Acute respiratory failure with hypoxia ([place]) 07/13/2016  . Chronic diastolic CHF (congestive heart failure) ([place]) 07/13/2016  . Sepsis, unspecified organism ([place]) 07/13/2016  . Acute pain of right knee 06/30/2016  . Fatigue 06/30/2016   . Creatinine elevation 06/30/2016  . Dyspnea on exertion   . COPD with acute exacerbation ([place]) 04/04/2015  . Elevated IgE level 03/18/2014  . Well adult exam 03/05/2014  . COPD with chronic bronchitis ([place]) 12/13/2013  . Venous insufficiency 11/03/2011  . Edema 11/03/2011  . Memory disturbance 05/13/2011  . CAD (coronary artery disease) 09/17/2010  . 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 Incidental pulmonary nodule 06/17/2009  . COLONIC POLYPS 10/17/2007  . BRONCHITIS, RECURRENT 10/17/2007  . GERD (gastroesophageal reflux disease) 10/17/2007  . Diverticulosis of large intestine 10/17/2007  . DEGENERATIVE JOINT DISEASE 10/17/2007  . Hyperglycemia 10/17/2007  . Dyslipidemia 10/02/2007  . Anxiety state 10/02/2007  . Obstructive sleep apnea 10/02/2007  . Essential hypertension 10/02/2007  . LOW BACK PAIN SYNDROME 10/02/2007    Lanice Shirts PT 09/03/2020, 6:19 PM  [facility] [address] [place], [place], [address] Phone: [phone]   Fax:  [phone]  Name: Larry Stone MRN: [id number] Date of Birth: May 29, 1950

## 2020-09-08 ENCOUNTER — Other Ambulatory Visit: Payer: Self-pay

## 2020-09-08 ENCOUNTER — Ambulatory Visit: Payer: Medicare Other

## 2020-09-08 DIAGNOSIS — M6281 Muscle weakness (generalized): Secondary | ICD-10-CM

## 2020-09-08 DIAGNOSIS — M5417 Radiculopathy, lumbosacral region: Secondary | ICD-10-CM

## 2020-09-08 DIAGNOSIS — R2681 Unsteadiness on feet: Secondary | ICD-10-CM

## 2020-09-08 DIAGNOSIS — R2689 Other abnormalities of gait and mobility: Secondary | ICD-10-CM

## 2020-09-08 NOTE — Therapy (Signed)
[facility] [address] [place], [place], [address] Phone: [phone]   Fax:  [phone]  Physical Therapy Treatment  Patient Details  Name: Larry Stone MRN: [id number] Date of Birth: [birth year] Referring Provider (PT): Eunice Blase MD   Encounter Date: 09/08/2020   PT End of Session - 09/08/20 1227    Visit Number 3    Number of Visits 7    Date for PT Re-Evaluation 10/21/20    Authorization Type UHC    Progress Note Due on Visit 7    PT Start Time 1230    PT Stop Time 1300    PT Time Calculation (min) 30 min    Equipment Utilized During Treatment Gait belt    Activity Tolerance Patient tolerated treatment well    Behavior During Therapy [facility] for tasks assessed/performed           Past Medical History:  Diagnosis Date  . Anxiety   . CAD    a. acute inferior lateral wall infarction in September 2004 treated medically. b.  cath 06/17/16 showing mild nonobstructive CAD with 30-40% ostial LM (eccentric), elevated LV filling pressures and normal LV function.  . Chronic diastolic CHF (congestive heart failure) ([place])   . CKD (chronic kidney disease), stage II   . COPD (chronic obstructive pulmonary disease) ([place])   . Diabetes mellitus   . Diverticulosis   . DJD (degenerative joint disease)   . GERD (gastroesophageal reflux disease)   . HTN (hypertension)   . Hyperlipidemia   . Low back pain syndrome   . Myocardial infarction ([place]) 2004  . Obesity   . OSA (obstructive sleep apnea)   . RBBB   . Recurrent aspiration bronchitis/pneumonia   . Recurrent aspiration pneumonia ([place])    [name] 07/13/2016  . Thrombocytopenia ([place])   . Tubular adenoma of colon 2014    Past Surgical History:  Procedure Laterality Date  . CARDIAC CATHETERIZATION    . LEFT HEART CATH AND CORONARY ANGIOGRAPHY N/A 06/17/2016   Procedure: Left Heart Cath and Coronary Angiography;  Surgeon: Burnell Blanks, MD;  Location: [place];  Service: Cardiovascular;  Laterality: N/A;  . UVULOPALATOPHARYNGOPLASTY     surgery for OSA    There were no vitals filed for this visit.   Subjective Assessment - 09/08/20 1237    Subjective Much improved following last session, only had symptoms when riding in car for 90 min.  has a dentist appt and needs to leave early today    Pertinent History Symptoms started about 3 months ago, no definite injury.  He recalls falling in January on ice and landing on his buttocks, but does not remember having this pain after that.  He had better after those injuries, and it was not until 3 months ago that he started having this intermittent pain.  Pain occurs in the hamstring area, and leads to cramping of the entire posterior thigh.  It happens consistently when driving, after about 10 or 15 minutes.  He often has to stop and get out of the car.  He walks with a limp when he first gets out of the car, but then the pain improves and he can walk without any trouble.  The pain does not occur when lying down.  Pain does not radiate below the knee.  No numbness or tingling in his leg.  No history of DVT and he is on Plavix chronically for anticoagulation ever since having a heart  attack.  Denies any low back pain.    Limitations Sitting    How long can you sit comfortably? 15 min    How long can you stand comfortably? unlimited    How long can you walk comfortably? unlimited    Pain Onset More than a month ago                             [facility] Adult PT Treatment/Exercise - 09/08/20 0001      Knee/Hip Exercises: Aerobic   Other Aerobic nustep 8' L2 arms 10      Manual Therapy   Manual Therapy Soft tissue mobilization    Manual therapy comments Continued to perform STM to R piriformis followed by review of piriformis stertch 30sx3 in both positions          Treatment of R piriformis muscle trigger point          PT Short Term Goals - 08/19/20 1459      PT  SHORT TERM GOAL #1   Title Asses 5x STS time    Baseline UTA    Time 4    Period Weeks    Status New    Target Date 09/16/20             PT Long Term Goals - 08/19/20 1500      PT LONG TERM GOAL #1   Title Patient to demo HEP to PT w/o need of VCs    Baseline Initial HEP established today    Time 6    Period Weeks    Status New    Target Date 10/07/20      PT LONG TERM GOAL #2   Title Improve and maintain pelvic alignment of suspectd posterior rotation of L ilium    Baseline Posterior L ilium rotation and    Time 6    Period Weeks    Status New    Target Date 10/07/20      PT LONG TERM GOAL #3   Title Assess 5x STS time    Baseline TBD    Time 6    Period Weeks    Status New    Target Date 10/07/20                 Plan - 09/08/20 1303    Clinical Impression Statement Todays session was limited due to patients need to leave at 100 due to dental visit,  Performed w/u on Nustep as precursor to pirifomis release and review of R hip stretching with PT assist.  Less soft tissue restriction and discomfort noted with piriformis release techniques, pelvis alignment remains symmetrical    Personal Factors and Comorbidities Comorbidity 1    Comorbidities CVA residual deficits R side weakness    Examination-Activity Limitations Sit    Examination-Participation Restrictions Driving    Stability/Clinical Decision Making Stable/Uncomplicated    Rehab Potential Good    PT Frequency 1x / week    PT Duration 6 weeks    PT Treatment/Interventions ADLs/Self Care Home Management;Aquatic Therapy;Gait training;Stair training;Functional mobility training;Therapeutic activities;Balance training;Neuromuscular re-education;Therapeutic exercise;Patient/family education;Orthotic Fit/Training;Manual techniques    PT Next Visit Plan check pelvic alignment and symptoms, advance stabilization exercises, soft tissue work, 5x STS    PT Home Exercise Plan [id number]    Consulted and Agree with  Plan of Care Patient           Patient will benefit from skilled therapeutic  intervention in order to improve the following deficits and impairments:  Abnormal gait,Difficulty walking,Decreased endurance,Increased muscle spasms,Decreased activity tolerance,Decreased balance,Decreased mobility  Visit Diagnosis: Unsteadiness on feet  Other abnormalities of gait and mobility  Radiculopathy, lumbosacral region  Muscle weakness (generalized)     Problem List Patient Active Problem List   Diagnosis Date Noted  . Paresthesias 09/01/2020  . Neck pain on left side 02/28/2020  . Allergic rhinitis 12/26/2019  . Cerebrovascular accident (CVA) of left thalamus ([place]) 12/05/2019  . Right hemiparesis ([place]) 12/04/2019  . Chronic renal insufficiency, stage 3 (moderate) ([place]) 07/05/2019  . Mild cognitive disorder 10/12/2017  . Viral pneumonitis 07/22/2016  . COPD with asthma ([place]) 07/22/2016  . Depression with anxiety 07/13/2016  . Acute respiratory failure with hypoxia ([place]) 07/13/2016  . Chronic diastolic CHF (congestive heart failure) ([place]) 07/13/2016  . Sepsis, unspecified organism ([place]) 07/13/2016  . Acute pain of right knee 06/30/2016  . Fatigue 06/30/2016  . Creatinine elevation 06/30/2016  . Dyspnea on exertion   . COPD with acute exacerbation ([place]) 04/04/2015  . Elevated IgE level 03/18/2014  . Well adult exam 03/05/2014  . COPD with chronic bronchitis ([place]) 12/13/2013  . Venous insufficiency 11/03/2011  . Edema 11/03/2011  . Memory disturbance 05/13/2011  . CAD (coronary artery disease) 09/17/2010  . 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 Incidental pulmonary nodule 06/17/2009  . COLONIC POLYPS 10/17/2007  . BRONCHITIS, RECURRENT 10/17/2007  . GERD (gastroesophageal reflux disease) 10/17/2007  . Diverticulosis of large intestine 10/17/2007  . DEGENERATIVE JOINT DISEASE 10/17/2007  . Hyperglycemia 10/17/2007  . Dyslipidemia 10/02/2007  . Anxiety state 10/02/2007  .  Obstructive sleep apnea 10/02/2007  . Essential hypertension 10/02/2007  . LOW BACK PAIN SYNDROME 10/02/2007    Lanice Shirts PT 09/08/2020, 1:19 PM  [facility] [address] [place], [place], [address] Phone: [phone]   Fax:  [phone]  Name: Larry Stone MRN: [id number] Date of Birth: 1950-10-08

## 2020-09-17 ENCOUNTER — Telehealth: Payer: Self-pay | Admitting: Adult Health

## 2020-09-17 NOTE — Telephone Encounter (Signed)
Called pt about rescheduling appointment next week due to Janett Billow being out of office. Pt accepted appointment on 7/11 but states he would like a call from the nurse to discuss questions about his medication. Please advise.

## 2020-09-18 NOTE — Telephone Encounter (Signed)
I will contact pt

## 2020-09-18 NOTE — Telephone Encounter (Signed)
Contacted pt to FU with questions he had regarding medication, LVM asking him to return my call.

## 2020-09-19 ENCOUNTER — Ambulatory Visit: Payer: Medicare Other | Attending: Family Medicine

## 2020-09-19 ENCOUNTER — Other Ambulatory Visit: Payer: Self-pay

## 2020-09-19 DIAGNOSIS — M6281 Muscle weakness (generalized): Secondary | ICD-10-CM | POA: Diagnosis present

## 2020-09-19 DIAGNOSIS — R2689 Other abnormalities of gait and mobility: Secondary | ICD-10-CM | POA: Insufficient documentation

## 2020-09-19 DIAGNOSIS — M5417 Radiculopathy, lumbosacral region: Secondary | ICD-10-CM | POA: Diagnosis present

## 2020-09-19 DIAGNOSIS — R2681 Unsteadiness on feet: Secondary | ICD-10-CM | POA: Insufficient documentation

## 2020-09-19 NOTE — Therapy (Signed)
[facility] [address] [place], [place], [address] Phone: [phone]   Fax:  [phone]  Physical Therapy Treatment  Patient Details  Name: Larry Stone MRN: [id number] Date of Birth: 09-27-1950 Referring Provider (PT): Eunice Blase MD   Encounter Date: 09/19/2020   PT End of Session - 09/19/20 1403    Visit Number 4    Number of Visits 7    Date for PT Re-Evaluation 10/21/20    Authorization Type UHC    Progress Note Due on Visit 7    PT Start Time 1315    PT Stop Time 1400    PT Time Calculation (min) 45 min    Equipment Utilized During Treatment Gait belt    Activity Tolerance Patient tolerated treatment well    Behavior During Therapy [facility] for tasks assessed/performed           Past Medical History:  Diagnosis Date  . Anxiety   . CAD    a. acute inferior lateral wall infarction in September 2004 treated medically. b.  cath 06/17/16 showing mild nonobstructive CAD with 30-40% ostial LM (eccentric), elevated LV filling pressures and normal LV function.  . Chronic diastolic CHF (congestive heart failure) ([place])   . CKD (chronic kidney disease), stage II   . COPD (chronic obstructive pulmonary disease) ([place])   . Diabetes mellitus   . Diverticulosis   . DJD (degenerative joint disease)   . GERD (gastroesophageal reflux disease)   . HTN (hypertension)   . Hyperlipidemia   . Low back pain syndrome   . Myocardial infarction ([place]) 2004  . Obesity   . OSA (obstructive sleep apnea)   . RBBB   . Recurrent aspiration bronchitis/pneumonia   . Recurrent aspiration pneumonia ([place])    [name] 07/13/2016  . Thrombocytopenia ([place])   . Tubular adenoma of colon 2014    Past Surgical History:  Procedure Laterality Date  . CARDIAC CATHETERIZATION    . LEFT HEART CATH AND CORONARY ANGIOGRAPHY N/A 06/17/2016   Procedure: Left Heart Cath and Coronary Angiography;  Surgeon: Burnell Blanks, MD;  Location: [facility];  Service: Cardiovascular;  Laterality: N/A;  . UVULOPALATOPHARYNGOPLASTY     surgery for OSA    There were no vitals filed for this visit.   Subjective Assessment - 09/19/20 1317    Subjective Only reports symptoms with prolonged sitting    Pertinent History Symptoms started about 3 months ago, no definite injury.  He recalls falling in January on ice and landing on his buttocks, but does not remember having this pain after that.  He had better after those injuries, and it was not until 3 months ago that he started having this intermittent pain.  Pain occurs in the hamstring area, and leads to cramping of the entire posterior thigh.  It happens consistently when driving, after about 10 or 15 minutes.  He often has to stop and get out of the car.  He walks with a limp when he first gets out of the car, but then the pain improves and he can walk without any trouble.  The pain does not occur when lying down.  Pain does not radiate below the knee.  No numbness or tingling in his leg.  No history of DVT and he is on Plavix chronically for anticoagulation ever since having a heart attack.  Denies any low back pain.    Limitations Sitting    How long can you sit  comfortably? 15 min    How long can you stand comfortably? unlimited    How long can you walk comfortably? unlimited    Pain Onset More than a month ago                             [facility] Adult PT Treatment/Exercise - 09/19/20 0001      Transfers   Transfers Sit to Stand    Comments Idependent      Ambulation/Gait   Ambulation/Gait Yes    Ambulation/Gait Assistance 7: Independent    Ambulation Distance (Feet) 115 Feet    Assistive device None    Gait Pattern Step-through pattern    Ambulation Surface Level;Indoor      Self-Care   Self-Care Other Self-Care Comments    Other Self-Care Comments  reviewed HEP and added tennis ball release to R piriformis and standing B GLUTEUS MEDIUS STRENGTHENING  through hip drop technique      Manual Therapy   Manual Therapy Soft tissue mobilization    Manual therapy comments Continued to perform STM to R piriformis followed by review of piriformis stertch 30sx3 in both positions                  PT Education - 09/19/20 1358    Education Details Instructed in sidelie clamshells and standing gluteus medius stengthening through modified hip hike/drop    Person(s) Educated Patient    Methods Explanation;Verbal cues;Tactile cues;Demonstration    Comprehension Verbalized understanding;Returned demonstration;Need further instruction            PT Short Term Goals - 08/19/20 1459      PT SHORT TERM GOAL #1   Title Asses 5x STS time    Baseline UTA    Time 4    Period Weeks    Status New    Target Date 09/16/20             PT Long Term Goals - 08/19/20 1500      PT LONG TERM GOAL #1   Title Patient to demo HEP to PT w/o need of VCs    Baseline Initial HEP established today    Time 6    Period Weeks    Status New    Target Date 10/07/20      PT LONG TERM GOAL #2   Title Improve and maintain pelvic alignment of suspectd posterior rotation of L ilium    Baseline Posterior L ilium rotation and    Time 6    Period Weeks    Status New    Target Date 10/07/20      PT LONG TERM GOAL #3   Title Assess 5x STS time    Baseline TBD    Time 6    Period Weeks    Status New    Target Date 10/07/20                 Plan - 09/19/20 1403    Clinical Impression Statement Todays session focused on assessment of pelvic alignment and soft tissue work and exercises designed to focus on R gluteus medius strengthening, issued tennis ball for R piriformis release    Personal Factors and Comorbidities Comorbidity 1    Comorbidities CVA residual deficits R side weakness    Examination-Activity Limitations Sit    Examination-Participation Restrictions Driving    Stability/Clinical Decision Making Stable/Uncomplicated    Rehab  Potential Good  PT Frequency 1x / week    PT Duration 6 weeks    PT Treatment/Interventions ADLs/Self Care Home Management;Aquatic Therapy;Gait training;Stair training;Functional mobility training;Therapeutic activities;Balance training;Neuromuscular re-education;Therapeutic exercise;Patient/family education;Orthotic Fit/Training;Manual techniques    PT Next Visit Plan 5x STS, assess benefit of tennis ball release, gluteus medius strengthening    PT Home Exercise Plan [email or web address]    Consulted and Agree with Plan of Care Patient           Patient will benefit from skilled therapeutic intervention in order to improve the following deficits and impairments:  Abnormal gait,Difficulty walking,Decreased endurance,Increased muscle spasms,Decreased activity tolerance,Decreased balance,Decreased mobility  Visit Diagnosis: Unsteadiness on feet  Other abnormalities of gait and mobility  Muscle weakness (generalized)     Problem List Patient Active Problem List   Diagnosis Date Noted  . Paresthesias 09/01/2020  . Neck pain on left side 02/28/2020  . Allergic rhinitis 12/26/2019  . Cerebrovascular accident (CVA) of left thalamus ([place]) 12/05/2019  . Right hemiparesis ([place]) 12/04/2019  . Chronic renal insufficiency, stage 3 (moderate) ([place]) 07/05/2019  . Mild cognitive disorder 10/12/2017  . Viral pneumonitis 07/22/2016  . COPD with asthma ([place]) 07/22/2016  . Depression with anxiety 07/13/2016  . Acute respiratory failure with hypoxia ([place]) 07/13/2016  . Chronic diastolic CHF (congestive heart failure) ([place]) 07/13/2016  . Sepsis, unspecified organism ([place]) 07/13/2016  . Acute pain of right knee 06/30/2016  . Fatigue 06/30/2016  . Creatinine elevation 06/30/2016  . Dyspnea on exertion   . COPD with acute exacerbation ([place]) 04/04/2015  . Elevated IgE level 03/18/2014  . Well adult exam 03/05/2014  . COPD with chronic bronchitis ([place]) 12/13/2013  . Venous insufficiency 11/03/2011  .  Edema 11/03/2011  . Memory disturbance 05/13/2011  . CAD (coronary artery disease) 09/17/2010  . 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 Incidental pulmonary nodule 06/17/2009  . COLONIC POLYPS 10/17/2007  . BRONCHITIS, RECURRENT 10/17/2007  . GERD (gastroesophageal reflux disease) 10/17/2007  . Diverticulosis of large intestine 10/17/2007  . DEGENERATIVE JOINT DISEASE 10/17/2007  . Hyperglycemia 10/17/2007  . Dyslipidemia 10/02/2007  . Anxiety state 10/02/2007  . Obstructive sleep apnea 10/02/2007  . Essential hypertension 10/02/2007  . LOW BACK PAIN SYNDROME 10/02/2007    Lanice Shirts PT 09/19/2020, 2:07 PM  [facility] [address] [place], [place], [address] Phone: [phone]   Fax:  [phone]  Name: Larry Stone MRN: [id number] Date of Birth: Jan 26, 1951

## 2020-09-22 ENCOUNTER — Other Ambulatory Visit: Payer: Self-pay

## 2020-09-22 ENCOUNTER — Ambulatory Visit: Payer: Medicare Other

## 2020-09-22 DIAGNOSIS — M5417 Radiculopathy, lumbosacral region: Secondary | ICD-10-CM

## 2020-09-22 DIAGNOSIS — R2681 Unsteadiness on feet: Secondary | ICD-10-CM

## 2020-09-22 DIAGNOSIS — M6281 Muscle weakness (generalized): Secondary | ICD-10-CM

## 2020-09-22 NOTE — Therapy (Addendum)
[facility] [address] [place], [place], [address] Phone: [phone]   Fax:  [phone]  Physical Therapy Treatment/DC Summary  Patient Details  Name: Larry Stone MRN: [id number] Date of Birth: October 14, 1950 Referring MD; Eunice Blase MD  Encounter Date: 09/22/2020 PHYSICAL THERAPY DISCHARGE SUMMARY  Visits from Start of Care: 7  Current functional level related to goals / functional outcomes: UTA   Remaining deficits: UTA   Education / Equipment: HEP   Patient agrees to discharge. Patient goals were partially met. Patient is being discharged due to not returning since the last visit.    PT End of Session - 09/22/20 1419     Visit Number 5    Number of Visits 7    Date for PT Re-Evaluation 10/21/20    Authorization Type UHC    Progress Note Due on Visit 7    PT Start Time 1320    PT Stop Time 1355    PT Time Calculation (min) 35 min    Equipment Utilized During Treatment Gait belt    Activity Tolerance Patient tolerated treatment well    Behavior During Therapy WFL for tasks assessed/performed             Past Medical History:  Diagnosis Date   Anxiety    CAD    a. acute inferior lateral wall infarction in September 2004 treated medically. b.  cath 06/17/16 showing mild nonobstructive CAD with 30-40% ostial LM (eccentric), elevated LV filling pressures and normal LV function.   Chronic diastolic CHF (congestive heart failure) ([facility])    CKD (chronic kidney disease), stage II    COPD (chronic obstructive pulmonary disease) ([facility])    Diabetes mellitus    Diverticulosis    DJD (degenerative joint disease)    GERD (gastroesophageal reflux disease)    HTN (hypertension)    Hyperlipidemia    Low back pain syndrome    Myocardial infarction ([place]) 2004   Obesity    OSA (obstructive sleep apnea)    RBBB    Recurrent aspiration bronchitis/pneumonia    Recurrent aspiration pneumonia ([place])    [name]  07/13/2016   Thrombocytopenia ([facility])    Tubular adenoma of colon 2014    Past Surgical History:  Procedure Laterality Date   CARDIAC CATHETERIZATION     LEFT HEART CATH AND CORONARY ANGIOGRAPHY N/A 06/17/2016   Procedure: Left Heart Cath and Coronary Angiography;  Surgeon: Burnell Blanks, MD;  Location: [facility];  Service: Cardiovascular;  Laterality: N/A;   UVULOPALATOPHARYNGOPLASTY     surgery for OSA    There were no vitals filed for this visit.   Subjective Assessment - 09/22/20 1323     Subjective Today reports symptoms unchanged, still has pain from prolonged sitting, strong enough to awaken him.  Has been comliant with HEP    Pertinent History Symptoms started about 3 months ago, no definite injury.  He recalls falling in January on ice and landing on his buttocks, but does not remember having this pain after that.  He had better after those injuries, and it was not until 3 months ago that he started having this intermittent pain.  Pain occurs in the hamstring area, and leads to cramping of the entire posterior thigh.  It happens consistently when driving, after about 10 or 15 minutes.  He often has to stop and get out of the car.  He walks with a limp when he first gets out of the car,  but then the pain improves and he can walk without any trouble.  The pain does not occur when lying down.  Pain does not radiate below the knee.  No numbness or tingling in his leg.  No history of DVT and he is on Plavix chronically for anticoagulation ever since having a heart attack.  Denies any low back pain.    Limitations Sitting    How long can you sit comfortably? 15 min    How long can you stand comfortably? unlimited    How long can you walk comfortably? unlimited    Pain Onset More than a month ago                               [facility] Adult PT Treatment/Exercise - 09/22/20 0001       Transfers   Transfers Sit to Stand    Sit to Stand 7: Independent       Ambulation/Gait   Ambulation/Gait Yes    Ambulation/Gait Assistance 7: Independent    Ambulation Distance (Feet) 115 Feet    Assistive device None    Gait Pattern Step-through pattern    Ambulation Surface Indoor      Self-Care   Self-Care Other Self-Care Comments    Other Self-Care Comments  reviewed HEP and of tennis ball release to R piriformis and standing B GLUTEUS MEDIUS STRENGTHENING through hip drop technique as well as assessing pelvic alignment with no issued found      Manual Therapy   Manual Therapy Soft tissue mobilization    Manual therapy comments Continued to perform STM to R piriformis followed by review of piriformis stertch 30sx3 in both positions                      PT Short Term Goals - 08/19/20 1459       PT SHORT TERM GOAL #1   Title Asses 5x STS time    Baseline UTA    Time 4    Period Weeks    Status New    Target Date 09/16/20               PT Long Term Goals - 09/22/20 1424       PT LONG TERM GOAL #1   Title Patient to demo HEP to PT w/o need of VCs    Baseline Initial HEP established today; 09/22/20 I in HEP performance    Time 6    Period Weeks    Status Achieved      PT LONG TERM GOAL #2   Title Improve and maintain pelvic alignment of suspectd posterior rotation of L ilium    Baseline Posterior L ilium rotation. :09/22/20 pelvic alignment equal    Status Achieved      PT LONG TERM GOAL #3   Title Assess 5x STS time    Baseline TBD    Time 6    Period Weeks    Status On-going      PT LONG TERM GOAL #4   Title Independent in updated HEP for balance and coordination exercises.    Baseline 09/22/20 Patient I in HEP    Time 6    Period Weeks    Status Achieved                   Plan - 09/22/20 1420     Clinical Impression Statement Continued focus on unchanging  pain, pelvic alignment normal and equal, no leg length noted, no SI dysfunction elicited, compliant with HEP, reviewed HEP, mild R gluteus medius  weakness being addressed by strengthening.  Tenderness to R piriformis remains painful.  Due to unchanging symptoms, recommend f/u with MD to determine further course of treatment and possible triggerpoint injection.    Personal Factors and Comorbidities Comorbidity 1    Comorbidities CVA residual deficits R side weakness    Examination-Activity Limitations Sit    Examination-Participation Restrictions Driving    Stability/Clinical Decision Making Stable/Uncomplicated    Rehab Potential Good    PT Frequency 1x / week    PT Duration 6 weeks    PT Treatment/Interventions ADLs/Self Care Home Management;Aquatic Therapy;Gait training;Stair training;Functional mobility training;Therapeutic activities;Balance training;Neuromuscular re-education;Therapeutic exercise;Patient/family education;Orthotic Fit/Training;Manual techniques    PT Next Visit Plan Hold PT until patient follows up with MD appot    PT Home Exercise Plan [email or web address]    Consulted and Agree with Plan of Care Patient             Patient will benefit from skilled therapeutic intervention in order to improve the following deficits and impairments:  Abnormal gait,Difficulty walking,Decreased endurance,Increased muscle spasms,Decreased activity tolerance,Decreased balance,Decreased mobility  Visit Diagnosis: Unsteadiness on feet  Muscle weakness (generalized)  Radiculopathy, lumbosacral region     Problem List Patient Active Problem List   Diagnosis Date Noted   Paresthesias 09/01/2020   Neck pain on left side 02/28/2020   Allergic rhinitis 12/26/2019   Cerebrovascular accident (CVA) of left thalamus ([place]) 12/05/2019   Right hemiparesis ([place]) 12/04/2019   Chronic renal insufficiency, stage 3 (moderate) ([place]) 07/05/2019   Mild cognitive disorder 10/12/2017   Viral pneumonitis 07/22/2016   COPD with asthma ([place]) 07/22/2016   Depression with anxiety 07/13/2016   Acute respiratory failure with hypoxia ([place]) 07/13/2016    Chronic diastolic CHF (congestive heart failure) ([place]) 07/13/2016   Sepsis, unspecified organism ([place]) 07/13/2016   Acute pain of right knee 06/30/2016   Fatigue 06/30/2016   Creatinine elevation 06/30/2016   Dyspnea on exertion    COPD with acute exacerbation ([place]) 04/04/2015   Elevated IgE level 03/18/2014   Well adult exam 03/05/2014   COPD with chronic bronchitis ([place]) 12/13/2013   Venous insufficiency 11/03/2011   Edema 11/03/2011   Memory disturbance 05/13/2011   CAD (coronary artery disease) 09/17/2010   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   Incidental pulmonary nodule 06/17/2009   COLONIC POLYPS 10/17/2007   BRONCHITIS, RECURRENT 10/17/2007   GERD (gastroesophageal reflux disease) 10/17/2007   Diverticulosis of large intestine 10/17/2007   DEGENERATIVE JOINT DISEASE 10/17/2007   Hyperglycemia 10/17/2007   Dyslipidemia 10/02/2007   Anxiety state 10/02/2007   Obstructive sleep apnea 10/02/2007   Essential hypertension 10/02/2007   LOW BACK PAIN SYNDROME 10/02/2007    Lanice Shirts PT 09/22/2020, 2:29 PM  [facility] [address] [place], [place], [address] Phone: [phone]   Fax:  [phone]  Name: Larry Stone MRN: [id number] Date of Birth: 1950/09/15

## 2020-09-25 ENCOUNTER — Ambulatory Visit: Payer: Medicare Other | Admitting: Adult Health

## 2020-09-29 ENCOUNTER — Ambulatory Visit: Payer: Medicare Other

## 2020-10-27 ENCOUNTER — Encounter: Payer: Self-pay | Admitting: Adult Health

## 2020-10-27 ENCOUNTER — Ambulatory Visit: Payer: Medicare Other | Admitting: Adult Health

## 2020-10-27 VITALS — BP 134/72 | HR 53 | Ht 69.0 in | Wt 197.4 lb

## 2020-10-27 DIAGNOSIS — G890 Central pain syndrome: Secondary | ICD-10-CM

## 2020-10-27 DIAGNOSIS — I639 Cerebral infarction, unspecified: Secondary | ICD-10-CM | POA: Diagnosis not present

## 2020-10-27 DIAGNOSIS — I6381 Other cerebral infarction due to occlusion or stenosis of small artery: Secondary | ICD-10-CM

## 2020-10-27 MED ORDER — AMITRIPTYLINE HCL 10 MG PO TABS: ORAL_TABLET | ORAL | 0 refills | Status: DC

## 2020-10-27 NOTE — Patient Instructions (Addendum)
Start Elavil (amitritpyline) 10mg  nightly for 1 week then increase to 20mg  nightly - please keep me updated on how you are doing  You could also trial compression gloves/socks which can help with nerve pain - these can be found at a medical supply store or online   Continue clopidogrel 75 mg daily  and pravastatin and zetia  for secondary stroke prevention  Continue to follow up with PCP regarding cholesterol and blood pressure management  Maintain strict control of hypertension with blood pressure goal below 130/90 and cholesterol with LDL cholesterol (bad cholesterol) goal below 70 mg/dL.       Followup in the future with me in 4 months or call earlier if needed       Thank you for coming to see [place] at [facility]. I hope we have been able to provide you high quality care today.  You may receive a patient satisfaction survey over the next few weeks. We would appreciate your feedback and comments so that we may continue to improve ourselves and the health of our patients.    Amitriptyline Tablets What is this medication? AMITRIPTYLINE (a mee TRIP ti leen) treats depression. It increases the amount of serotonin and norepinephrine in the brain, hormones that help regulate mood.It belongs to a group of medications called tricyclic antidepressants (TCAs). This medicine may be used for other purposes; ask your health care provider orpharmacist if you have questions. COMMON BRAND NAME(S): Elavil, Vanatrip What should I tell my care team before I take this medication? They need to know if you have any of these conditions: An alcohol problem Asthma, trouble breathing Bipolar disorder or schizophrenia Difficulty passing urine, prostate trouble Glaucoma Heart disease or previous heart attack Liver disease Over active thyroid Seizures Thoughts or plans of suicide, a previous suicide attempt, or family history of suicide attempt An unusual or allergic reaction to  amitriptyline, other medications, foods, dyes, or preservatives Pregnant or trying to get pregnant Breast-feeding How should I use this medication? Take this medication by mouth with a drink of water. Follow the directions on the prescription label. You can take the tablets with or without food. Take your medication at regular intervals. Do not take it more often than directed. Do not stop taking this medication suddenly except upon the advice of your care team. Stopping this medication too quickly may cause serious side effects oryour condition may worsen. A special MedGuide will be given to you by the pharmacist with eachprescription and refill. Be sure to read this information carefully each time. Talk to your care team regarding the use of this medication in children.Special care may be needed. Overdosage: If you think you have taken too much of this medicine contact apoison control center or emergency room at once. NOTE: This medicine is only for you. Do not share this medicine with others. What if I miss a dose? If you miss a dose, take it as soon as you can. If it is almost time for yournext dose, take only that dose. Do not take double or extra doses. What may interact with this medication? Do not take this medication with any of the following: Arsenic trioxide Certain medications used to regulate abnormal heartbeat or to treat other heart conditions Cisapride Droperidol Halofantrine Linezolid MAOIs like Carbex, Eldepryl, Marplan, Nardil, and Parnate Methylene blue Other medications for mental depression Phenothiazines like perphenazine, thioridazine and chlorpromazine Pimozide Probucol Procarbazine Sparfloxacin St. John's Wort This medication may also interact with the following: Atropine and related  medications like hyoscyamine, scopolamine, tolterodine and others Barbiturate medications for inducing sleep or treating seizures, like  phenobarbital Cimetidine Disulfiram Ethchlorvynol Thyroid hormones such as levothyroxine Ziprasidone This list may not describe all possible interactions. Give your health care provider a list of all the medicines, herbs, non-prescription drugs, or dietary supplements you use. Also tell them if you smoke, drink alcohol, or use illegaldrugs. Some items may interact with your medicine. What should I watch for while using this medication? Tell your care team if your symptoms do not get better or if they get worse. Visit your care team for regular checks on your progress. Because it may take several weeks to see the full effects of this medication, it is important tocontinue your treatment as prescribed by your care team. Patients and their families should watch out for new or worsening thoughts of suicide or depression. Also watch out for sudden changes in feelings such as feeling anxious, agitated, panicky, irritable, hostile, aggressive, impulsive, severely restless, overly excited and hyperactive, or not being able to sleep. If this happens, especially at the beginning of treatment or after a change indose, call your care team. You may get drowsy or dizzy. Do not drive, use machinery, or do anything that needs mental alertness until you know how this medication affects you. Do not stand or sit up quickly, especially if you are an older patient. This reduces the risk of dizzy or fainting spells. Alcohol may interfere with the effect ofthis medication. Avoid alcoholic drinks. Do not treat yourself for coughs, colds, or allergies without asking your careteam for advice. Some ingredients can increase possible side effects. Your mouth may get dry. Chewing sugarless gum or sucking hard candy, and drinking plenty of water will help. Contact your care team if the problem doesnot go away or is severe. This medication may cause dry eyes and blurred vision. If you wear contact lenses you may feel some discomfort.  Lubricating drops may help. See your eyedoctor if the problem does not go away or is severe. This medication can cause constipation. Try to have a bowel movement at least every 2 to 3 days. If you do not have a bowel movement for 3 days, call yourcare team. This medication can make you more sensitive to the sun. Keep out of the sun. If you cannot avoid being in the sun, wear protective clothing and use sunscreen.Do not use sun lamps or tanning beds/booths. What side effects may I notice from receiving this medication? Side effects that you should report to your care team as soon as possible: Allergic reactions-skin rash, itching, hives, swelling of the face, lips, tongue, or throat Heart rhythm changes- fast or irregular heartbeat, dizziness, feeling faint or lightheaded, chest pain, trouble breathing Serotonin syndrome-irritability, confusion, fast or irregular heartbeat, muscle stiffness, twitching muscles, sweating, high fever, seizure, chills, vomiting, diarrhea Sudden eye pain or change in vision such as blurry vision, seeing halos around lights, vision loss Thoughts of suicide or self-harm, worsening mood, feelings of depression Side effects that usually do not require medical attention (report to your careteam if they continue or are bothersome): Change in appetite or weight Change in sex drive or performance Constipation Dizziness Drowsiness Dry mouth Tremors This list may not describe all possible side effects. Call your doctor for medical advice about side effects. You may report side effects to FDA at1-800-FDA-1088. Where should I keep my medication? Keep out of the reach of children and pets. Store at room temperature between 20 and 25 degrees  C (68 and 77 degrees F).Throw away any unused medication after the expiration date. NOTE: This sheet is a summary. It may not cover all possible information. If you have questions about this medicine, talk to your doctor, pharmacist, orhealth  care provider.  2022 Elsevier/Gold Standard (2020-05-02 13:09:40)

## 2020-10-27 NOTE — Progress Notes (Signed)
[facility] [address] [place]. [place] [address] [phone]       OFFICE FOLLOW UP NOTE  [name] Date of Birth:  [birth year] Medical Record Number:  [id number] Take care: Referring MD: [name]  Reason for Referral: Stroke   Chief Complaint  Patient presents with   Follow-up    Rm 2 alone here for 4 month f/u. Reports since his last visit he has not been doing well. He reports tingling on the right side primarily in the hand and face is still a issue for him Reports the med      HPI:   Today, 10/27/2020, Larry Stone returns for 4 month stroke follow-up unaccompanied.  Stable from stroke standpoint without new stroke/TIA symptoms.  He continues to have difficulty with right face, hand and occasional foot paresthesias describing as burning and electrical shock sensation.  He has previously trialed pregabalin and topiramate but unable to tolerate.  Previously complained worsening gait difficulty, slurred speech and cognitive impairment after initiating pregabalin which resolved shortly after discontinuing.  He is currently participating in PT for right posterior thigh pain followed by orthopedics [name].  He also reports memory changes since his stroke including increased irritability and low tolerance levels - hx of depression and anxiety currently on bupropion and Xanax as needed per PCP. Reports compliance on Plavix, pravastatin and Zetia without associated side effects.  Blood pressure today 134/72.  No further concerns at this time.    History provided for reference purposes only Update 06/19/2020 JM: Larry Stone returns for stroke follow-up after prior visit approximately 5 months ago with [name].  Topiramate initiated at prior visit for poststroke paresthesias but unable to tolerate. Placed on Lyrica 50 mg twice daily beginning of November and reports approximately 2 weeks after, he was noticing increased lethargy and fluctuation of gait  impairment with imbalance (getting pulled to the right) and lightheadedness, slurred speech and cognitive difficulties such as delayed responses and short-term memory difficulties.  Gait impairment with imbalance and slurred speech all present initially with stroke.  Denies new stroke/TIA symptoms.   He is unsure if Lyrica was beneficial for paresthesias as he does report some improvement since prior visit but right hand and right facial paresthesias still present. He also admits to struggling with depression with history of depression PTA on bupropion 150 mg twice daily but noticed worsening after his stroke.  He is also on Xanax as needed per PCP He has been trying to keep active and does report doing HEP daily as advised during therapy sessions  Reports compliance on Plavix and pravastatin and Zetia-denies side effects Blood pressure today 135/66  No further concerns at this time  Initial visit 02/06/2020 [name]: Larry Stone is a pleasant 70 year old Caucasian male with past medical history of hypertension, hyper lipidemia, diabetes, congestive heart failure and coronary artery disease who developed sudden onset of right lower face and hand numbness as well as imbalance and difficulty walking on 12/03/2019.  He did not seek medical help the same day as if waited for it to get better.  Next day he went to the hospital but got tired waiting in the ER since he was not seen and instead went to see his primary physician Dr. [name] who ordered an outpatient stroke work-up.  MRI scan of the brain done on 12/20/2019 shows a subacute left thalamic lacunar infarct.  Carotid ultrasound on 12/10/2019 showed no significant extracranial stenosis.  Echocardiogram on 12/28/2018 showed slightly  diminished ejection fraction of 45 to 50% but no clot or definite cardiac source of embolism was noted.  He underwent 30 days of outpatient telemetry cardiac monitoring which was negative for atrial fibrillation or significant  arrhythmias.  Carotid ultrasound on 12/10/2019 was unremarkable.  LDL cholesterol was 78 mg percent on 12/26/2019.  Patient is presently on Plavix 75 mg daily which is tolerating well without bruising or bleeding.  He states his gait and balance have improved and he has finished outpatient physical and occupational therapy.  He still stumbles occasionally but as long to walk slowly and carefully.  He has had no falls or injuries.  He however still has persistent paresthesias in his right face and right hand which are bothersome and he would like to try some medications for this.  He has had no recurrent stroke or TIA symptoms.  States his blood pressure is well controlled and today it is 113/65 in office.  States his sugars are also doing well however he cannot tell me when his last hemoglobin A1c was checked.  He has been scheduled to undergo loop recorder pending my consultation visit today.  ROS:   14 system review of systems is positive for those listed in HPI and all other systems negative  PMH:  Past Medical History:  Diagnosis Date   Anxiety    CAD    a. acute inferior lateral wall infarction in September 2004 treated medically. b.  cath 06/17/16 showing mild nonobstructive CAD with 30-40% ostial LM (eccentric), elevated LV filling pressures and normal LV function.   Chronic diastolic CHF (congestive heart failure) (HCC)    CKD (chronic kidney disease), stage II    COPD (chronic obstructive pulmonary disease) (HCC)    Diabetes mellitus    Diverticulosis    DJD (degenerative joint disease)    GERD (gastroesophageal reflux disease)    HTN (hypertension)    Hyperlipidemia    Low back pain syndrome    Myocardial infarction ([place]) 2004   Obesity    OSA (obstructive sleep apnea)    RBBB    Recurrent aspiration bronchitis/pneumonia    Recurrent aspiration pneumonia ([place])    Larry Stone 07/13/2016   Thrombocytopenia (HCC)    Tubular adenoma of colon 2014    Social History:  Social History    Socioeconomic History   Marital status: Married    Spouse name: Not on file   Number of children: 1   Years of education: Not on file   Highest education level: Not on file  Occupational History   Occupation: [employer] [place] Use   Smoking status: Former    Packs/day: 1.00    Years: 20.00    Pack years: 20.00    Types: Cigarettes, Cigars    Quit date: 05/08/1998    Years since quitting: 22.4   Smokeless tobacco: Never   Tobacco comments:    quit in 2005  Vaping Use   Vaping Use: Never used  Substance and Sexual Activity   Alcohol use: Yes    Alcohol/week: 1.0 standard drink    Types: 1 Standard drinks or equivalent per week    Comment: social drinker   Drug use: No   Sexual activity: Yes  Other Topics Concern   Not on file  Social History Narrative   Lives with spouse only   Right Handed   Drinks <1 cup caffeine daily   Social Determinants of Health   Financial Resource Strain: Low Risk  Difficulty of Paying Living Expenses: Not hard at all  Food Insecurity: No Food Insecurity   Worried About [place] in the Last Year: Never true   Ran Out of Food in the Last Year: Never true  Transportation Needs: No Transportation Needs   Lack of Transportation (Medical): No   Lack of Transportation (Non-Medical): No  Physical Activity: Inactive   Days of Exercise per Week: 0 days   Minutes of Exercise per Session: 0 min  Stress: No Stress Concern Present   Feeling of Stress : Not at all  Social Connections: Unknown   Frequency of Communication with Friends and Family: More than three times a week   Frequency of Social Gatherings with Friends and Family: More than three times a week   Attends Religious Services: Patient refused   Marine scientist or Organizations: Patient refused   Attends Archivist Meetings: Patient refused   Marital Status: Patient refused  Intimate Production manager Violence: Not on file     Medications:   Current Outpatient Medications on File Prior to Visit  Medication Sig Dispense Refill   albuterol (PROAIR HFA) 108 (90 Base) MCG/ACT inhaler Inhale 1-2 puffs into the lungs every 6 (six) hours as needed for wheezing or shortness of breath. 18 g 2   albuterol (PROVENTIL) (2.5 MG/3ML) 0.083% nebulizer solution Use one vial in the nebulizer every 4-6 hours if needed for cough or wheeze 225 mL 11   ALPRAZolam (XANAX) 0.5 MG tablet TAKE 1 TABLET (0.5 MG TOTAL) BY MOUTH 3 (THREE) TIMES DAILY AS NEEDED. 90 tablet 1   ammonium lactate (LAC-HYDRIN) 12 % lotion APPLY TOPICALLY AS DIRECTED. 400 mL 3   budesonide-formoterol (SYMBICORT) 160-4.5 MCG/ACT inhaler INHALE 2 PUFFS INTO THE LUNGS 2 (TWO) TIMES DAILY. 3 Inhaler 0   buPROPion (WELLBUTRIN SR) 150 MG 12 hr tablet Take 1 tablet (150 mg total) by mouth 2 (two) times daily. 180 tablet 3   carvedilol (COREG) 25 MG tablet Take 1 tablet (25 mg total) by mouth 2 (two) times daily with a meal. Annual appt due in Sept must see provider for future refills 180 tablet 1   cetirizine (ZYRTEC) 10 MG tablet Take 1 tablet (10 mg total) by mouth daily. 30 tablet 11   Cholecalciferol (VITAMIN D3) 5000 UNITS CAPS Take one tablet by mouth daily     clopidogrel (PLAVIX) 75 MG tablet TAKE 1 TABLET BY MOUTH EVERY DAY 90 tablet 3   clotrimazole-betamethasone (LOTRISONE) cream APPLY AS DIRECTED 45 g 11   Coenzyme Q10 (COQ10) 100 MG CAPS Take as directed 30 each 11   ezetimibe (ZETIA) 10 MG tablet TAKE 1 TABLET BY MOUTH EVERY DAY 90 tablet 3   hydrALAZINE (APRESOLINE) 50 MG tablet Take 1 tablet (50 mg total) by mouth 4 (four) times daily. 360 tablet 3   metFORMIN (GLUCOPHAGE) 500 MG tablet TAKE 1 TABLET BY MOUTH EVERY DAY WITH BREAKFAST 90 tablet 3   montelukast (SINGULAIR) 10 MG tablet TAKE 1 TABLET BY MOUTH EVERYDAY AT BEDTIME 90 tablet 3   nitroGLYCERIN (NITROSTAT) 0.4 MG SL tablet PLACE 1 TABLET UNDER THE TONGUE EVERY 5 MINUTES AS NEEDED. FOR UP TO 3  DOSES. 25 tablet 2   pravastatin (PRAVACHOL) 40 MG tablet TAKE 1 TABLET BY MOUTH EVERY DAY 90 tablet 3   vitamin C (ASCORBIC ACID) 500 MG tablet Take 500 mg by mouth daily.      No current facility-administered medications on file prior to visit.  Allergies:   Allergies  Allergen Reactions   Ace Inhibitors Swelling   Angiotensin Receptor Blockers Swelling   Aspirin Hives   Amlodipine     Leg swelling   Crestor [Rosuvastatin Calcium]     myalgias   Lipitor [Atorvastatin Calcium]     arthralgias   Codeine Nausea And Vomiting   Irbesartan Other (See Comments)    REACTION: allergic to ARBs w/ angioedema   Ramipril Other (See Comments)    REACTION: Allergic to ACE's w/ angioedema...    Physical Exam Today's Vitals   10/27/20 1045  BP: 134/72  Pulse: (!) 53  SpO2: 98%  Weight: 197 lb 6 oz (89.5 kg)  Height: 5\' 9"  (1.753 m)    Body mass index is 29.15 kg/m.   General: well developed, well nourished pleasant elderly Caucasian male, seated, in no evident distress Head: head normocephalic and atraumatic.   Neck: supple with no carotid or supraclavicular bruits Cardiovascular: regular rate and rhythm, no murmurs Musculoskeletal: no deformity Skin:  no rash/petichiae Vascular:  Normal pulses all extremities  Neurologic Exam Mental Status: Awake and fully alert.  Fluent speech and language. Oriented to place and time. Recent and remote memory intact. Attention span, concentration and fund of knowledge appropriate during visit. Mood and affect appropriate.  Cranial Nerves: Pupils equal, briskly reactive to light. Extraocular movements full without nystagmus. Visual fields full to confrontation. Hearing intact. Facial sensation intact.  Mild right nasolabial fold flattening.  Tongue, palate moves normally and symmetrically.  Motor: Normal bulk and tone. Normal strength in all tested extremity muscles. Sensory.: intact to touch , pinprick , position and vibratory sensation  -subjective paresthesias in the right face and hand. Coordination: Rapid alternating movements normal in all extremities. Finger-to-nose and heel-to-shin performed accurately bilaterally. Gait and Station: Arises from chair without difficulty. Stance is normal. Gait demonstrates normal stride length and balance without use of assistive device.  Mild to moderate difficulty with heel, toe and tandem walk.  Romberg negative. Reflexes: 1+ and symmetric. Toes downgoing.        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  Vascular risk factors of hypertension hyperlipidemia coronary artery disease.      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 with persistent right hand and face and occasional foot paresthesias as well as mood changes -Recommend initiating amitriptyline 10 mg nightly for 1 week then increase to 20 mg nightly for possible benefit of both paresthesias and mood changes -he was advised to call with any difficulty tolerating or possible need of increase after 2-3 weeks on 20mg  dose  -Discussed possible benefit of compression glove for hand paresthesias  -Intolerant to topiramate and pregabalin  -Continue Plavix 75 mg daily, pravastatin and Zetia for secondary stroke prevention -Discussed secondary stroke prevention measures and importance of close PCP follow-up for aggressive stroke risk factor management -HTN: BP goal<130/90.  Stable today monitored by PCP/cardiology -HLD: LDL goal<70. On pravastatin and zetia per PCP -prior lipid panel satisfactory -DM: A1c goal<7.0.  A1c 5.6 (02/2020) on Metformin per PCP    Follow-up in 4 months or call earlier if needed   CC:  [facility] provider: Dr. Hilton Cork, Evie Lacks, MD   I spent 36 minutes of face-to-face and non-face-to-face time with patient.  This included previsit chart review, lab  review, study review, order entry, electronic health record documentation, patient education and discussion regarding history of prior stroke including secondary stroke prevention and aggressive stroke risk factor management, residual deficits including code stroke pain syndrome and further treatment options and answered all other questions to patient satisfaction  Frann Rider, AGNP-BC  [facility] [address] [place], [place] [address]  Phone [phone] Fax [phone] Note: This document was prepared with digital dictation and possible smart phrase technology. Any transcriptional errors that result from this process are unintentional.

## 2020-10-28 NOTE — Progress Notes (Signed)
I agree with the above plan 

## 2020-11-07 ENCOUNTER — Other Ambulatory Visit: Payer: Self-pay | Admitting: Internal Medicine

## 2020-11-11 ENCOUNTER — Ambulatory Visit: Payer: Medicare Other | Admitting: Family Medicine

## 2020-11-11 ENCOUNTER — Other Ambulatory Visit: Payer: Self-pay

## 2020-11-11 ENCOUNTER — Encounter: Payer: Self-pay | Admitting: Family Medicine

## 2020-11-11 DIAGNOSIS — M79604 Pain in right leg: Secondary | ICD-10-CM | POA: Diagnosis not present

## 2020-11-11 MED ORDER — BACLOFEN 10 MG PO TABS: 5.0000 mg | ORAL_TABLET | Freq: Three times a day (TID) | ORAL | 3 refills | Status: DC | PRN

## 2020-11-11 NOTE — Progress Notes (Signed)
Office Visit Note   Patient: Larry Stone           Date of Birth: 10/23/1950           MRN: [id number] Visit Date: 11/11/2020 Requested by: Cassandria Anger, MD [place],   [id number] PCP: Plotnikov, Evie Lacks, MD  Subjective: Chief Complaint  Patient presents with   Right Thigh - Pain, Follow-up    Was not improving with PT. The pain would get better during therapy, but return in a couple hours. Cannot sit for very long, due to the pain.     HPI: He is here with persistent right posterior thigh pain.  Physical therapy helped temporarily at best.  He continues to have pain on a daily basis, he cannot sit very long.  He has tried a variety of questions.  Pain does not radiate below the knee.  He has minimal back pain.                ROS:   All other systems were reviewed and are negative.  Objective: Vital Signs: There were no vitals taken for this visit.  Physical Exam:  General:  Alert and oriented, in no acute distress. Pulm:  Breathing unlabored. Psy:  Normal mood, congruent affec  Right leg: Negative straight leg raise.  Lower extremity strength and reflexes remain normal.  He has a tender trigger point in the hamstring belly.  Imaging: No results found.  Assessment & Plan: Chronic right posterior thigh pain, etiology uncertain.  Cannot rule out lumbar foraminal stenosis. -Elected to proceed with MRI of the right thigh and the lumbar spine.  Trial of baclofen.     Procedures: No procedures performed        PMFS History: Patient Active Problem List   Diagnosis Date Noted   Paresthesias 09/01/2020   Neck pain on left side 02/28/2020   Allergic rhinitis 12/26/2019   Cerebrovascular accident (CVA) of left thalamus ([place]) 12/05/2019   Right hemiparesis ([place]) 12/04/2019   Chronic renal insufficiency, stage 3 (moderate) ([place]) 07/05/2019   Mild cognitive disorder 10/12/2017   Viral pneumonitis 07/22/2016   COPD with asthma ([place])  07/22/2016   Depression with anxiety 07/13/2016   Acute respiratory failure with hypoxia ([place]) 07/13/2016   Chronic diastolic CHF (congestive heart failure) ([place]) 07/13/2016   Sepsis, unspecified organism ([place]) 07/13/2016   Acute pain of right knee 06/30/2016   Fatigue 06/30/2016   Creatinine elevation 06/30/2016   Dyspnea on exertion    COPD with acute exacerbation ([place]) 04/04/2015   Elevated IgE level 03/18/2014   Well adult exam 03/05/2014   COPD with chronic bronchitis ([place]) 12/13/2013   Venous insufficiency 11/03/2011   Edema 11/03/2011   Memory disturbance 05/13/2011   CAD (coronary artery disease) 09/17/2010   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 acute inferior lateral wall infarction in September 2004 treated medically. b.  cath 06/17/16 showing mild nonobstructive CAD with 30-40% ostial LM (eccentric), elevated LV filling pressures and normal  LV function.   Chronic diastolic CHF (congestive heart failure) (HCC)    CKD (chronic kidney disease), stage II    COPD (chronic obstructive pulmonary disease) (HCC)    Diabetes mellitus    Diverticulosis    DJD (degenerative joint disease)    GERD (gastroesophageal reflux disease)    HTN (hypertension)    Hyperlipidemia    Low back pain syndrome    Myocardial infarction ([place]) 2004   Obesity    OSA (obstructive sleep apnea)    RBBB    Recurrent aspiration bronchitis/pneumonia    Recurrent aspiration pneumonia ([place])    [name] 07/13/2016    Thrombocytopenia (HCC)    Tubular adenoma of colon 2014    Family History  Problem Relation Age of Onset   Colon cancer Father    Stroke Mother    Heart disease Mother    Hypertension Sister    Diabetes Sister    Colon cancer Paternal Uncle     Past Surgical History:  Procedure Laterality Date   CARDIAC CATHETERIZATION     LEFT HEART CATH AND CORONARY ANGIOGRAPHY N/A 06/17/2016   Procedure: Left Heart Cath and Coronary Angiography;  Surgeon: Burnell Blanks, MD;  Location: [facility];  Service: Cardiovascular;  Laterality: N/A;   UVULOPALATOPHARYNGOPLASTY     surgery for OSA   Social History   Occupational History   Occupation: Probation officer of [employer]  Tobacco Use   Smoking status: Former    Packs/day: 1.00    Years: 20.00    Pack years: 20.00    Types: Cigarettes, Cigars    Quit date: 05/08/1998    Years since quitting: 22.5   Smokeless tobacco: Never   Tobacco comments:    quit in 2005  Vaping Use   Vaping Use: Never used  Substance and Sexual Activity   Alcohol use: Yes    Alcohol/week: 1.0 standard drink    Types: 1 Standard drinks or equivalent per week    Comment: social drinker   Drug use: No   Sexual activity: Yes

## 2020-11-12 ENCOUNTER — Encounter: Payer: Self-pay | Admitting: Internal Medicine

## 2020-11-12 ENCOUNTER — Ambulatory Visit: Payer: Medicare Other | Admitting: Internal Medicine

## 2020-11-12 VITALS — BP 110/60 | HR 57 | Temp 97.5°F | Wt 190.5 lb

## 2020-11-12 DIAGNOSIS — H8112 Benign paroxysmal vertigo, left ear: Secondary | ICD-10-CM | POA: Diagnosis not present

## 2020-11-12 MED ORDER — MECLIZINE HCL 25 MG PO TABS
12.5000 mg | ORAL_TABLET | Freq: Three times a day (TID) | ORAL | 0 refills | Status: AC | PRN
Start: 2020-11-12 — End: ?

## 2020-11-12 NOTE — Progress Notes (Signed)
Acute office Visit     This visit occurred during the SARS-CoV-2 public health emergency.  Safety protocols were in place, including screening questions prior to the visit, additional usage of staff PPE, and extensive cleaning of exam room while observing appropriate contact time as indicated for disinfecting solutions.    CC/Reason for Visit: Vertigo  HPI: Larry Stone is a 70 y.o. male who is coming in today for the above mentioned reasons.  He has a complex past medical history routinely followed by Dr. [name].  He states that yesterday he had acute onset of severe vertigo with nausea.  He noticed that this was more prominent when bending over to feed the dogs.  When he turned his head over to the left he had severe onset of vertigo.  His wife did some kind of maneuver at home that improved the dizziness that involved head turning.  While doing this maneuver his wife noticed that his eyes were jumping from side to side.  Past Medical/Surgical History: Past Medical History:  Diagnosis Date   Anxiety    CAD    a. acute inferior lateral wall infarction in September 2004 treated medically. b.  cath 06/17/16 showing mild nonobstructive CAD with 30-40% ostial LM (eccentric), elevated LV filling pressures and normal LV function.   Chronic diastolic CHF (congestive heart failure) (HCC)    CKD (chronic kidney disease), stage II    COPD (chronic obstructive pulmonary disease) (HCC)    Diabetes mellitus    Diverticulosis    DJD (degenerative joint disease)    GERD (gastroesophageal reflux disease)    HTN (hypertension)    Hyperlipidemia    Low back pain syndrome    Myocardial infarction ([place]) 2004   Obesity    OSA (obstructive sleep apnea)    RBBB    Recurrent aspiration bronchitis/pneumonia    Recurrent aspiration pneumonia ([place])    [name] 07/13/2016   Thrombocytopenia (HCC)    Tubular adenoma of colon 2014    Past Surgical History:  Procedure Laterality Date   CARDIAC  CATHETERIZATION     LEFT HEART CATH AND CORONARY ANGIOGRAPHY N/A 06/17/2016   Procedure: Left Heart Cath and Coronary Angiography;  Surgeon: Burnell Blanks, MD;  Location: [facility];  Service: Cardiovascular;  Laterality: N/A;   UVULOPALATOPHARYNGOPLASTY     surgery for OSA    Social History:  reports that he quit smoking about 22 years ago. His smoking use included cigarettes and cigars. He has a 20.00 pack-year smoking history. He has never used smokeless tobacco. He reports current alcohol use of about 1.0 standard drink of alcohol per week. He reports that he does not use drugs.  Allergies: Allergies  Allergen Reactions   Ace Inhibitors Swelling   Angiotensin Receptor Blockers Swelling   Aspirin Hives   Amlodipine     Leg swelling   Crestor [Rosuvastatin Calcium]     myalgias   Lipitor [Atorvastatin Calcium]     arthralgias   Codeine Nausea And Vomiting   Irbesartan Other (See Comments)    REACTION: allergic to ARBs w/ angioedema   Ramipril Other (See Comments)    REACTION: Allergic to ACE's w/ angioedema...    Family History:  Family History  Problem Relation Age of Onset   Colon cancer Father    Stroke Mother    Heart disease Mother    Hypertension Sister    Diabetes Sister    Colon cancer Paternal Uncle  Current Outpatient Medications:    albuterol (PROAIR HFA) 108 (90 Base) MCG/ACT inhaler, Inhale 1-2 puffs into the lungs every 6 (six) hours as needed for wheezing or shortness of breath., Disp: 18 g, Rfl: 2   albuterol (PROVENTIL) (2.5 MG/3ML) 0.083% nebulizer solution, Use one vial in the nebulizer every 4-6 hours if needed for cough or wheeze, Disp: 225 mL, Rfl: 11   ALPRAZolam (XANAX) 0.5 MG tablet, TAKE 1 TABLET BY MOUTH 3 TIMES DAILY AS NEEDED., Disp: 90 tablet, Rfl: 2   amitriptyline (ELAVIL) 10 MG tablet, Take 1 tablet (10 mg total) by mouth at bedtime for 7 days, THEN 2 tablets (20 mg total) at bedtime for 23 days., Disp: 53 tablet, Rfl:  0   ammonium lactate (LAC-HYDRIN) 12 % lotion, APPLY TOPICALLY AS DIRECTED., Disp: 400 mL, Rfl: 3   baclofen (LIORESAL) 10 MG tablet, Take 0.5-1 tablets (5-10 mg total) by mouth 3 (three) times daily as needed for muscle spasms., Disp: 30 each, Rfl: 3   budesonide-formoterol (SYMBICORT) 160-4.5 MCG/ACT inhaler, INHALE 2 PUFFS INTO THE LUNGS 2 (TWO) TIMES DAILY., Disp: 3 Inhaler, Rfl: 0   buPROPion (WELLBUTRIN SR) 150 MG 12 hr tablet, Take 1 tablet (150 mg total) by mouth 2 (two) times daily., Disp: 180 tablet, Rfl: 3   carvedilol (COREG) 25 MG tablet, Take 1 tablet (25 mg total) by mouth 2 (two) times daily with a meal. Annual appt due in Sept must see provider for future refills, Disp: 180 tablet, Rfl: 1   cetirizine (ZYRTEC) 10 MG tablet, Take 1 tablet (10 mg total) by mouth daily., Disp: 30 tablet, Rfl: 11   Cholecalciferol (VITAMIN D3) 5000 UNITS CAPS, Take one tablet by mouth daily, Disp: , Rfl:    clopidogrel (PLAVIX) 75 MG tablet, TAKE 1 TABLET BY MOUTH EVERY DAY, Disp: 90 tablet, Rfl: 3   clotrimazole-betamethasone (LOTRISONE) cream, APPLY AS DIRECTED, Disp: 45 g, Rfl: 11   Coenzyme Q10 (COQ10) 100 MG CAPS, Take as directed, Disp: 30 each, Rfl: 11   ezetimibe (ZETIA) 10 MG tablet, TAKE 1 TABLET BY MOUTH EVERY DAY, Disp: 90 tablet, Rfl: 3   hydrALAZINE (APRESOLINE) 50 MG tablet, Take 1 tablet (50 mg total) by mouth 4 (four) times daily., Disp: 360 tablet, Rfl: 3   meclizine (ANTIVERT) 25 MG tablet, Take 0.5 tablets (12.5 mg total) by mouth 3 (three) times daily as needed for dizziness., Disp: 30 tablet, Rfl: 0   metFORMIN (GLUCOPHAGE) 500 MG tablet, TAKE 1 TABLET BY MOUTH EVERY DAY WITH BREAKFAST, Disp: 90 tablet, Rfl: 3   montelukast (SINGULAIR) 10 MG tablet, TAKE 1 TABLET BY MOUTH EVERYDAY AT BEDTIME, Disp: 90 tablet, Rfl: 3   nitroGLYCERIN (NITROSTAT) 0.4 MG SL tablet, PLACE 1 TABLET UNDER THE TONGUE EVERY 5 MINUTES AS NEEDED. FOR UP TO 3 DOSES., Disp: 25 tablet, Rfl: 2   pravastatin  (PRAVACHOL) 40 MG tablet, TAKE 1 TABLET BY MOUTH EVERY DAY, Disp: 90 tablet, Rfl: 3   vitamin C (ASCORBIC ACID) 500 MG tablet, Take 500 mg by mouth daily. , Disp: , Rfl:   Review of Systems:  Constitutional: Denies fever, chills, diaphoresis, appetite change and fatigue.  HEENT: Denies photophobia, eye pain, redness, hearing loss, ear pain, congestion, sore throat, rhinorrhea, sneezing, mouth sores, trouble swallowing, neck pain, neck stiffness and tinnitus.   Respiratory: Denies SOB, DOE, cough, chest tightness,  and wheezing.   Cardiovascular: Denies chest pain, palpitations and leg swelling.  Gastrointestinal: Denies  vomiting, abdominal pain, diarrhea, constipation, blood in stool  and abdominal distention.  Genitourinary: Denies dysuria, urgency, frequency, hematuria, flank pain and difficulty urinating.  Endocrine: Denies: hot or cold intolerance, sweats, changes in hair or nails, polyuria, polydipsia. Musculoskeletal: Denies myalgias, back pain, joint swelling, arthralgias and gait problem.  Skin: Denies pallor, rash and wound.  Neurological: Denies seizures, syncope, weakness, light-headedness, numbness and headaches.  Hematological: Denies adenopathy. Easy bruising, personal or family bleeding history  Psychiatric/Behavioral: Denies suicidal ideation, mood changes, confusion, nervousness, sleep disturbance and agitation    Physical Exam: Vitals:   11/12/20 1138  BP: 110/60  Pulse: (!) 57  Temp: (!) 97.5 F (36.4 C)  TempSrc: Oral  SpO2: 97%  Weight: 190 lb 8 oz (86.4 kg)    Body mass index is 28.13 kg/m.   Constitutional: NAD, calm, comfortable Eyes: PERRL, lids and conjunctivae normal, wears corrective lenses ENMT: Mucous membranes are moist. Tympanic membrane is pearly white, no erythema or bulging. Neck: normal, supple, no masses, no thyromegaly Respiratory: clear to auscultation bilaterally, no wheezing, no crackles. Normal respiratory effort. No accessory muscle  use.  Cardiovascular: Regular rate and rhythm, no murmurs / rubs / gallops. No extremity edema.  Psychiatric: Normal judgment and insight. Alert and oriented x 3. Normal mood.    Impression and Plan:  BPPV (benign paroxysmal positional vertigo), left - Plan: meclizine (ANTIVERT) 25 MG tablet -by description suspect this to be benign paroxysmal positional vertigo. -have provided instructions on how to perform an epley maneuver and have given him a youtube link to use as well. -i will prescribe some meclizine to use as needed for extreme vertigo. -he knows that he can call me to schedule vestibular training if fails to improve.    Patient Instructions  -Nice seeing you today!!  -May use Meclizine 12.5 mg (half a tablet) as needed for dizziness.  -Perform the Epley maneuver when having a vertigo attack, focusing on the left side.     Lelon Frohlich, MD [facility]

## 2020-11-12 NOTE — Telephone Encounter (Signed)
First Transmission to pharmacy failed.. Pls resend alprazolam

## 2020-11-12 NOTE — Patient Instructions (Addendum)
-  Nice seeing you today!!  -May use Meclizine 12.5 mg (half a tablet) as needed for dizziness.  -Perform the Epley maneuver when having a vertigo attack, focusing on the left side.

## 2020-11-23 ENCOUNTER — Other Ambulatory Visit: Payer: Self-pay | Admitting: Adult Health

## 2020-12-04 ENCOUNTER — Ambulatory Visit
Admission: RE | Admit: 2020-12-04 | Discharge: 2020-12-04 | Disposition: A | Discharge status: Home / Self Care | Payer: Medicare Other | Source: Ambulatory Visit | Attending: Family Medicine | Admitting: Family Medicine

## 2020-12-04 DIAGNOSIS — M79604 Pain in right leg: Secondary | ICD-10-CM

## 2020-12-05 ENCOUNTER — Telehealth: Payer: Self-pay | Admitting: Family Medicine

## 2020-12-05 NOTE — Telephone Encounter (Signed)
MRI shows a tear of the adductor magnus tendon from the pelvis.  It's about 2.4 cm away from the bone.

## 2020-12-08 ENCOUNTER — Telehealth: Payer: Self-pay | Admitting: Family Medicine

## 2020-12-08 ENCOUNTER — Other Ambulatory Visit: Payer: Self-pay | Admitting: Internal Medicine

## 2020-12-08 DIAGNOSIS — H8112 Benign paroxysmal vertigo, left ear: Secondary | ICD-10-CM

## 2020-12-08 NOTE — Telephone Encounter (Signed)
MRI lumbar spine shows degenerative changes, but no nerve impingement.  I think the tendon tear in your hip is the cause of the pain.  [place]

## 2020-12-09 ENCOUNTER — Other Ambulatory Visit: Payer: Self-pay | Admitting: Internal Medicine

## 2020-12-09 MED ORDER — ALPRAZOLAM 0.5 MG PO TABS: 0.5000 mg | ORAL_TABLET | Freq: Three times a day (TID) | ORAL | 1 refills | Status: DC | PRN

## 2020-12-10 ENCOUNTER — Other Ambulatory Visit: Payer: Self-pay

## 2020-12-10 ENCOUNTER — Encounter: Payer: Self-pay | Admitting: Family Medicine

## 2020-12-10 ENCOUNTER — Ambulatory Visit: Payer: Medicare Other | Admitting: Family Medicine

## 2020-12-10 DIAGNOSIS — M79604 Pain in right leg: Secondary | ICD-10-CM

## 2020-12-10 NOTE — Progress Notes (Signed)
Office Visit Note   Patient: Larry Stone           Date of Birth: Oct 15, 1950           MRN: [id number] Visit Date: 12/10/2020 Requested by: Cassandria Anger, MD [place],  [place] [address] PCP: Plotnikov, Evie Lacks, MD  Subjective: Chief Complaint  Patient presents with   Lower Back - Follow-up    Review of MRIs of Lsp and right femur   Right Thigh - Follow-up    HPI: He is here with ongoing right thigh pain.  He is here to discuss his MRI results.  MRI showed a complete tear of the hamstring component of the abductor magnus tendon from the ischial tuberosity.  He has pain when sitting, and pain when he first stands up to walk.  Once he is up walking, his pain gets better.               ROS:   All other systems were reviewed and are negative.  Objective: Vital Signs: There were no vitals taken for this visit.  Physical Exam:  General:  Alert and oriented, in no acute distress. Pulm:  Breathing unlabored. Psy:  Normal mood, congruent affect.  No exam done today.  Imaging: Images were reviewed in detail on computer.    Assessment & Plan: Right abductor magnus tendon rupture -We will refer him to Dr. Erlinda Hong for surgical consultation.     Procedures: No procedures performed        PMFS History: Patient Active Problem List   Diagnosis Date Noted   Paresthesias 09/01/2020   Neck pain on left side 02/28/2020   Allergic rhinitis 12/26/2019   Cerebrovascular accident (CVA) of left thalamus ([place]) 12/05/2019   Right hemiparesis ([place]) 12/04/2019   Chronic renal insufficiency, stage 3 (moderate) ([place]) 07/05/2019   Mild cognitive disorder 10/12/2017   Viral pneumonitis 07/22/2016   COPD with asthma ([place]) 07/22/2016   Depression with anxiety 07/13/2016   Acute respiratory failure with hypoxia ([place]) 07/13/2016   Chronic diastolic CHF (congestive heart failure) ([place]) 07/13/2016   Sepsis, unspecified organism ([place]) 07/13/2016   Acute pain of right  knee 06/30/2016   Fatigue 06/30/2016   Creatinine elevation 06/30/2016   Dyspnea on exertion    COPD with acute exacerbation ([place]) 04/04/2015   Elevated IgE level 03/18/2014   Well adult exam 03/05/2014   COPD with chronic bronchitis ([place]) 12/13/2013   Venous insufficiency 11/03/2011   Edema 11/03/2011   Memory disturbance 05/13/2011   CAD (coronary artery disease) 09/17/2010   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 acute inferior lateral wall infarction in September 2004 treated medically. b.  cath 06/17/16 showing mild nonobstructive CAD with 30-40% ostial LM (eccentric), elevated LV filling pressures and normal LV function.   Chronic diastolic CHF (congestive heart failure) (HCC)    CKD (chronic kidney disease), stage II    COPD (chronic obstructive pulmonary disease) (HCC)    Diabetes mellitus    Diverticulosis  DJD (degenerative joint disease)    GERD (gastroesophageal reflux disease)    HTN (hypertension)    Hyperlipidemia    Low back pain syndrome    Myocardial infarction ([place]) 2004   Obesity    OSA (obstructive sleep apnea)    RBBB    Recurrent aspiration bronchitis/pneumonia    Recurrent aspiration pneumonia ([place])    [name] 07/13/2016   Thrombocytopenia (HCC)    Tubular adenoma of colon 2014    Family History  Problem Relation Age of Onset   Colon cancer Father    Stroke Mother    Heart disease Mother    Hypertension Sister    Diabetes Sister    Colon cancer Paternal  Uncle     Past Surgical History:  Procedure Laterality Date   CARDIAC CATHETERIZATION     LEFT HEART CATH AND CORONARY ANGIOGRAPHY N/A 06/17/2016   Procedure: Left Heart Cath and Coronary Angiography;  Surgeon: Burnell Blanks, MD;  Location: [place] [facility];  Service: Cardiovascular;  Laterality: N/A;   UVULOPALATOPHARYNGOPLASTY     surgery for OSA   Social History   Occupational History   Occupation: [employer]   Smoking status: Former    Packs/day: 1.00    Years: 20.00    Pack years: 20.00    Types: Cigarettes, Cigars    Quit date: 05/08/1998    Years since quitting: 22.6   Smokeless tobacco: Never   Tobacco comments:    quit in 2005  Vaping Use   Vaping Use: Never used  Substance and Sexual Activity   Alcohol use: Yes    Alcohol/week: 1.0 standard drink    Types: 1 Standard drinks or equivalent per week    Comment: social drinker   Drug use: No   Sexual activity: Yes

## 2020-12-17 ENCOUNTER — Encounter: Payer: Self-pay | Admitting: Orthopaedic Surgery

## 2020-12-17 ENCOUNTER — Ambulatory Visit: Payer: Medicare Other | Admitting: Orthopaedic Surgery

## 2020-12-17 ENCOUNTER — Other Ambulatory Visit: Payer: Self-pay

## 2020-12-17 DIAGNOSIS — S76311A Strain of muscle, fascia and tendon of the posterior muscle group at thigh level, right thigh, initial encounter: Secondary | ICD-10-CM

## 2020-12-17 MED ORDER — TRAMADOL HCL 50 MG PO TABS: 50.0000 mg | ORAL_TABLET | Freq: Every day | ORAL | 0 refills | Status: DC | PRN

## 2020-12-17 NOTE — Progress Notes (Signed)
Office Visit Note   Patient: Larry Stone           Date of Birth: 12-Mar-1951           MRN: [id number] Visit Date: 12/17/2020              Requested by: Cassandria Anger, MD [place],  [place] [address] PCP: Plotnikov, Evie Lacks, MD   Assessment & Plan: Visit Diagnoses:  1. Right proximal hamstring tendon rupture, initial encounter     Plan: MRI shows rupture of the proximal hamstring of the abductor magnus component.  There is surrounding edema.  Overall pain is at a reasonable level and does not affect daily activities.  We will try tramadol to see if this will help with the pain that he has when he is sitting.  No surgical intervention recommended based on findings.  Activity as tolerated.  Follow-up as needed.  Follow-Up Instructions: Return if symptoms worsen or fail to improve.   Orders:  No orders of the defined types were placed in this encounter.  Meds ordered this encounter  Medications   traMADol (ULTRAM) 50 MG tablet    Sig: Take 1-2 tablets (50-100 mg total) by mouth daily as needed.    Dispense:  20 tablet    Refill:  0      Procedures: No procedures performed   Clinical Data: No additional findings.   Subjective: Chief Complaint  Patient presents with   Right Leg - Pain    Larry Stone is a 70 year old gentleman referral from [name] for recent discovery of a proximal hamstring rupture found on MRI.  Patient reports no specific injuries or trauma.  He he only has pain when he has been sitting for a while.  He tried physical therapy which really did not help.  He did not notice any bruising point.  He is on Plavix daily basis.   Review of Systems  Constitutional: Negative.   All other systems reviewed and are negative.   Objective: Vital Signs: There were no vitals taken for this visit.  Physical Exam Vitals and nursing note reviewed.  Constitutional:      Appearance: He is well-developed.  Pulmonary:     Effort:  Pulmonary effort is normal.  Abdominal:     Palpations: Abdomen is soft.  Skin:    General: Skin is warm.  Neurological:     Mental Status: He is alert and oriented to person, place, and time.  Psychiatric:        Behavior: Behavior normal.        Thought Content: Thought content normal.        Judgment: Judgment normal.    Ortho Exam Right thigh exam shows a slight tenderness to the ischial tuberosity and the proximal hamstrings.  No significant weakness to resistance of the hamstrings.  Range of motion of the hip is well-tolerated.  No swelling or bruising or skin lesions. Specialty Comments:  No specialty comments available.  Imaging: No results found.   PMFS History: Patient Active Problem List   Diagnosis Date Noted   Paresthesias 09/01/2020   Neck pain on left side 02/28/2020   Allergic rhinitis 12/26/2019   Cerebrovascular accident (CVA) of left thalamus ([facility]) 12/05/2019   Right hemiparesis ([facility]) 12/04/2019   Chronic renal insufficiency, stage 3 (moderate) ([facility]) 07/05/2019   Mild cognitive disorder 10/12/2017   Viral pneumonitis 07/22/2016   COPD with asthma ([place]) 07/22/2016   Depression with anxiety 07/13/2016  Acute respiratory failure with hypoxia (HCC) 07/13/2016   Chronic diastolic CHF (congestive heart failure) ([facility]) 07/13/2016   Sepsis, unspecified organism ([place] [place]) 07/13/2016   Acute pain of right knee 06/30/2016   Fatigue 06/30/2016   Creatinine elevation 06/30/2016   Dyspnea on exertion    COPD with acute exacerbation ([facility]) 04/04/2015   Elevated IgE level 03/18/2014   Well adult exam 03/05/2014   COPD with chronic bronchitis ([facility]) 12/13/2013   Venous insufficiency 11/03/2011   Edema 11/03/2011   Memory disturbance 05/13/2011   CAD (coronary artery disease) 09/17/2010   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 acute inferior lateral wall infarction in September 2004 treated medically. b.  cath 06/17/16 showing mild nonobstructive CAD with 30-40% ostial LM (eccentric), elevated LV filling pressures and normal LV function.   Chronic diastolic CHF (congestive heart failure) (HCC)    CKD (chronic kidney disease), stage II    COPD (chronic obstructive pulmonary disease) (HCC)    Diabetes mellitus    Diverticulosis    DJD (degenerative joint disease)    GERD (gastroesophageal reflux disease)    HTN (hypertension)    Hyperlipidemia    Low back pain syndrome    Myocardial infarction ([facility]) 2004   Obesity    OSA (obstructive sleep apnea)    RBBB    Recurrent aspiration bronchitis/pneumonia    Recurrent aspiration pneumonia ([place])    [name] 07/13/2016   Thrombocytopenia (HCC)    Tubular adenoma of colon 2014    Family History  Problem Relation Age of Onset   Colon cancer Father    Stroke Mother    Heart disease Mother    Hypertension Sister    Diabetes Sister    Colon cancer Paternal Uncle     Past Surgical History:  Procedure Laterality Date   CARDIAC CATHETERIZATION     LEFT HEART CATH AND CORONARY ANGIOGRAPHY N/A 06/17/2016   Procedure: Left Heart Cath and Coronary Angiography;  Surgeon: Burnell Blanks, MD;  Location: [facility];  Service: Cardiovascular;  Laterality: N/A;   UVULOPALATOPHARYNGOPLASTY     surgery for OSA   Social History   Occupational History   Occupation: [employer] Use   Smoking status: Former    Packs/day: 1.00    Years: 20.00    Pack years: 20.00    Types:  Cigarettes, Cigars    Quit date: 05/08/1998    Years since quitting: 22.6   Smokeless tobacco: Never   Tobacco comments:    quit in 2005  Vaping Use   Vaping Use: Never used  Substance and Sexual Activity   Alcohol use: Yes    Alcohol/week: 1.0 standard drink    Types: 1 Standard drinks or equivalent per week    Comment: social drinker   Drug use: No   Sexual activity: Yes

## 2020-12-26 ENCOUNTER — Encounter: Payer: Self-pay | Admitting: Orthopaedic Surgery

## 2020-12-26 ENCOUNTER — Other Ambulatory Visit: Payer: Self-pay | Admitting: Orthopaedic Surgery

## 2020-12-26 MED ORDER — TRAMADOL HCL 50 MG PO TABS: 50.0000 mg | ORAL_TABLET | Freq: Four times a day (QID) | ORAL | 0 refills | Status: DC | PRN

## 2020-12-26 MED ORDER — TIZANIDINE HCL 4 MG PO TABS: 4.0000 mg | ORAL_TABLET | Freq: Four times a day (QID) | ORAL | 3 refills | Status: DC | PRN

## 2020-12-26 NOTE — Telephone Encounter (Signed)
I refilled tramadol and sent tizanidine

## 2020-12-30 ENCOUNTER — Telehealth: Payer: Self-pay | Admitting: Orthopaedic Surgery

## 2020-12-30 NOTE — Telephone Encounter (Signed)
Patient called advised he hurt his right thigh and is in a lot of pain. Patient said he don't know if he can stand the pain without seeing someone as soon as possible. Patient asked for a call back right away. The number to contact patient is [phone]

## 2020-12-31 MED ORDER — HYDROCODONE-ACETAMINOPHEN 5-325 MG PO TABS: 1.0000 | ORAL_TABLET | Freq: Four times a day (QID) | ORAL | 0 refills | Status: DC | PRN

## 2020-12-31 NOTE — Telephone Encounter (Signed)
Spoke to patient.  Sounds like he slipped in the garage yesterday and forcefully extended his leg which made the pain worse.  Likely reinjured it and aggravated it.  I sent in a prescription for Norco.

## 2020-12-31 NOTE — Telephone Encounter (Signed)
Noted  

## 2021-01-06 ENCOUNTER — Ambulatory Visit: Payer: Medicare Other | Admitting: Orthopaedic Surgery

## 2021-01-06 ENCOUNTER — Encounter: Payer: Self-pay | Admitting: Orthopaedic Surgery

## 2021-01-06 ENCOUNTER — Other Ambulatory Visit: Payer: Self-pay

## 2021-01-06 DIAGNOSIS — S76311A Strain of muscle, fascia and tendon of the posterior muscle group at thigh level, right thigh, initial encounter: Secondary | ICD-10-CM | POA: Diagnosis not present

## 2021-01-06 NOTE — Progress Notes (Signed)
Office Visit Note   Patient: Larry Stone           Date of Birth: [birth year]           MRN: [id number] Visit Date: 01/06/2021              Requested by: Cassandria Anger, MD [place],  [place] [address] PCP: Plotnikov, Evie Lacks, MD   Assessment & Plan: Visit Diagnoses:  1. Right proximal hamstring tendon rupture, initial encounter     Plan: Based on findings impression is reinjury of the right hamstrings tear.  His new injury may be a more distal tear which is in the muscle belly and should heal quicker.  He will continue to use Ace bandage for compression and heat.  Activity as tolerated.  He will call us back anytime if he needs a refill on his medications.  Total face to face encounter time was greater than 25 minutes and over half of this time was spent in counseling and/or coordination of care.  Follow-Up Instructions: Return if symptoms worsen or fail to improve.   Orders:  No orders of the defined types were placed in this encounter.  No orders of the defined types were placed in this encounter.     Procedures: No procedures performed   Clinical Data: No additional findings.   Subjective: Chief Complaint  Patient presents with   Right Thigh - Pain    HPI  Larry Stone comes in today for reactivation right hamstrings injury.  Last week he slipped and overextended his leg and flex his hip and had significant increase in pain in the posterior thigh.  He can still walk but with some pain.  He has been using a cane to help with the symptoms.  Taking hydrocodone as needed.  He found that compression from an Ace bandage really helped with the pain.  Currently using heat as well.  Review of Systems   Objective: Vital Signs: There were no vitals taken for this visit.  Physical Exam  Ortho Exam  Right thigh shows no significant bruising or swelling.  No significant tenderness of the hamstrings.  Gentle range of motion of the knee and hip  well-tolerated.  No neurovascular compromise.  Specialty Comments:  No specialty comments available.  Imaging: No results found.   PMFS History: Patient Active Problem List   Diagnosis Date Noted   Paresthesias 09/01/2020   Neck pain on left side 02/28/2020   Allergic rhinitis 12/26/2019   Cerebrovascular accident (CVA) of left thalamus ([place]) 12/05/2019   Right hemiparesis ([facility]) 12/04/2019   Chronic renal insufficiency, stage 3 (moderate) ([place]) 07/05/2019   Mild cognitive disorder 10/12/2017   Viral pneumonitis 07/22/2016   COPD with asthma ([facility]) 07/22/2016   Depression with anxiety 07/13/2016   Acute respiratory failure with hypoxia ([facility]) 07/13/2016   Chronic diastolic CHF (congestive heart failure) ([facility]) 07/13/2016   Sepsis, unspecified organism ([facility]) 07/13/2016   Acute pain of right knee 06/30/2016   Fatigue 06/30/2016   Creatinine elevation 06/30/2016   Dyspnea on exertion    COPD with acute exacerbation ([facility]) 04/04/2015   Elevated IgE level 03/18/2014   Well adult exam 03/05/2014   COPD with chronic bronchitis ([place]) 12/13/2013   Venous insufficiency 11/03/2011   Edema 11/03/2011   Memory disturbance 05/13/2011   CAD (coronary artery disease) 09/17/2010   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 acute inferior lateral wall infarction in September 2004 treated medically. b.  cath 06/17/16 showing mild nonobstructive CAD with 30-40% ostial LM (eccentric), elevated LV filling  pressures and normal LV function.   Chronic diastolic CHF (congestive heart failure) (HCC)    CKD (chronic kidney disease), stage II    COPD (chronic obstructive pulmonary disease) (HCC)    Diabetes mellitus    Diverticulosis    DJD (degenerative joint disease)    GERD (gastroesophageal reflux disease)    HTN (hypertension)    Hyperlipidemia    Low back pain syndrome    Myocardial infarction ([facility]) 2004   Obesity    OSA (obstructive sleep apnea)    RBBB    Recurrent aspiration bronchitis/pneumonia    Recurrent aspiration pneumonia ([facility])    [name] 07/13/2016   Thrombocytopenia (HCC)    Tubular adenoma of colon 2014    Family History  Problem Relation Age of Onset   Colon cancer Father    Stroke Mother    Heart disease Mother    Hypertension Sister    Diabetes Sister    Colon cancer Paternal Uncle     Past Surgical History:  Procedure Laterality Date   CARDIAC CATHETERIZATION     LEFT HEART CATH AND CORONARY ANGIOGRAPHY N/A 06/17/2016   Procedure: Left Heart Cath and Coronary Angiography;  Surgeon: Burnell Blanks, MD;  Location: [facility];  Service: Cardiovascular;  Laterality: N/A;   UVULOPALATOPHARYNGOPLASTY     surgery for OSA   Social History   Occupational History   Occupation: [employer] Use   Smoking status: Former    Packs/day: 1.00    Years: 20.00    Pack years: 20.00    Types: Cigarettes, Cigars    Quit date: 05/08/1998    Years since quitting: 22.6   Smokeless tobacco: Never   Tobacco comments:    quit in 2005  Vaping Use   Vaping Use: Never used  Substance and Sexual Activity   Alcohol use: Yes    Alcohol/week: 1.0 standard drink    Types: 1 Standard drinks or equivalent per week    Comment: social drinker   Drug use: No   Sexual activity: Yes

## 2021-01-07 ENCOUNTER — Encounter: Payer: Medicare Other | Admitting: Internal Medicine

## 2021-01-23 ENCOUNTER — Other Ambulatory Visit: Payer: Self-pay | Admitting: Internal Medicine

## 2021-01-23 DIAGNOSIS — E785 Hyperlipidemia, unspecified: Secondary | ICD-10-CM

## 2021-01-29 ENCOUNTER — Encounter: Payer: Self-pay | Admitting: Gastroenterology

## 2021-02-02 ENCOUNTER — Encounter: Payer: Medicare Other | Admitting: Internal Medicine

## 2021-02-04 ENCOUNTER — Other Ambulatory Visit: Payer: Self-pay | Admitting: Internal Medicine

## 2021-02-17 ENCOUNTER — Ambulatory Visit (INDEPENDENT_AMBULATORY_CARE_PROVIDER_SITE_OTHER): Payer: Medicare Other | Admitting: Internal Medicine

## 2021-02-17 ENCOUNTER — Other Ambulatory Visit: Payer: Self-pay

## 2021-02-17 ENCOUNTER — Encounter: Payer: Self-pay | Admitting: Internal Medicine

## 2021-02-17 ENCOUNTER — Other Ambulatory Visit: Payer: Self-pay | Admitting: Internal Medicine

## 2021-02-17 DIAGNOSIS — Z8673 Personal history of transient ischemic attack (TIA), and cerebral infarction without residual deficits: Secondary | ICD-10-CM | POA: Diagnosis not present

## 2021-02-17 DIAGNOSIS — R739 Hyperglycemia, unspecified: Secondary | ICD-10-CM

## 2021-02-17 DIAGNOSIS — F411 Generalized anxiety disorder: Secondary | ICD-10-CM

## 2021-02-17 DIAGNOSIS — I6381 Other cerebral infarction due to occlusion or stenosis of small artery: Secondary | ICD-10-CM | POA: Diagnosis not present

## 2021-02-17 DIAGNOSIS — G8929 Other chronic pain: Secondary | ICD-10-CM

## 2021-02-17 DIAGNOSIS — N183 Chronic kidney disease, stage 3 unspecified: Secondary | ICD-10-CM

## 2021-02-17 DIAGNOSIS — I2510 Atherosclerotic heart disease of native coronary artery without angina pectoris: Secondary | ICD-10-CM

## 2021-02-17 DIAGNOSIS — M25551 Pain in right hip: Secondary | ICD-10-CM

## 2021-02-17 DIAGNOSIS — F09 Unspecified mental disorder due to known physiological condition: Secondary | ICD-10-CM

## 2021-02-17 DIAGNOSIS — N2889 Other specified disorders of kidney and ureter: Secondary | ICD-10-CM

## 2021-02-17 LAB — BASIC METABOLIC PANEL
BUN: 14 mg/dL (ref 6–23)
CO2: 31 mEq/L (ref 19–32)
Calcium: 8.9 mg/dL (ref 8.4–10.5)
Chloride: 104 mEq/L (ref 96–112)
Creatinine, Ser: 1.34 mg/dL (ref 0.40–1.50)
GFR: 53.58 mL/min — ABNORMAL LOW (ref >60.00)
Glucose, Bld: 87 mg/dL (ref 70–99)
Potassium: 4.5 mEq/L (ref 3.5–5.1)
Sodium: 140 mEq/L (ref 135–145)

## 2021-02-17 LAB — HEMOGLOBIN A1C: Hgb A1c MFr Bld: 5.8 % (ref 4.6–6.5)

## 2021-02-17 MED ORDER — TRAMADOL HCL 50 MG PO TABS: 50.0000 mg | ORAL_TABLET | Freq: Four times a day (QID) | ORAL | 0 refills | Status: DC

## 2021-02-17 NOTE — Assessment & Plan Note (Signed)
Will try Tramadol - low dose  Potential benefits of a long term opioids use as well as potential risks (i.e. addiction risk, apnea etc) and complications (i.e. Somnolence, constipation and others) were explained to the patient and were aknowledged.

## 2021-02-17 NOTE — Assessment & Plan Note (Signed)
S/p left thalamus infarct. There is no brain hemorrhage Plavix, Pravastatin Pt stopped Lyrica for R fingers, cheek tingling  Try [place] mane Will try Tramadol - low dose if tolerated

## 2021-02-17 NOTE — Assessment & Plan Note (Signed)
Xanax prn  Potential benefits of a long term benzodiazepines  use as well as potential risks  and complications were explained to the patient and were aknowledged. 

## 2021-02-17 NOTE — Assessment & Plan Note (Signed)
Cont with Coreg, Hydralazine

## 2021-02-17 NOTE — Assessment & Plan Note (Signed)
Post-CVA. Better

## 2021-02-17 NOTE — Progress Notes (Signed)
Subjective:  Patient ID: Larry Stone, male    DOB: October 28, 1950  Age: 70 y.o. MRN: [id number]  CC: Annual Exam   HPI Larry Stone presents for R hip pain - chronic (pt saw Dr Erlinda Hong). F/u CVA, anxiety, pain/"pins" in the R side after CVA F/u vertigo, CRI  Outpatient Medications Prior to Visit  Medication Sig Dispense Refill   albuterol (PROVENTIL) (2.5 MG/3ML) 0.083% nebulizer solution Use one vial in the nebulizer every 4-6 hours if needed for cough or wheeze 225 mL 11   ALPRAZolam (XANAX) 0.5 MG tablet Take 1 tablet (0.5 mg total) by mouth 3 (three) times daily as needed. 270 tablet 1   amitriptyline (ELAVIL) 10 MG tablet 2 tablets at bed time 60 tablet 1   ammonium lactate (LAC-HYDRIN) 12 % lotion APPLY TOPICALLY AS DIRECTED. 400 mL 3   baclofen (LIORESAL) 10 MG tablet Take 0.5-1 tablets (5-10 mg total) by mouth 3 (three) times daily as needed for muscle spasms. 30 each 3   budesonide-formoterol (SYMBICORT) 160-4.5 MCG/ACT inhaler INHALE 2 PUFFS INTO THE LUNGS 2 (TWO) TIMES DAILY. 3 Inhaler 0   buPROPion (WELLBUTRIN SR) 150 MG 12 hr tablet Take 1 tablet (150 mg total) by mouth 2 (two) times daily. 180 tablet 3   Cholecalciferol (VITAMIN D3) 5000 UNITS CAPS Take one tablet by mouth daily     clopidogrel (PLAVIX) 75 MG tablet TAKE 1 TABLET BY MOUTH EVERY DAY 90 tablet 3   clotrimazole-betamethasone (LOTRISONE) cream APPLY AS DIRECTED 45 g 11   Coenzyme Q10 (COQ10) 100 MG CAPS Take as directed 30 each 11   ezetimibe (ZETIA) 10 MG tablet TAKE 1 TABLET BY MOUTH EVERY DAY 90 tablet 3   hydrALAZINE (APRESOLINE) 50 MG tablet Take 1 tablet (50 mg total) by mouth 4 (four) times daily. 360 tablet 3   meclizine (ANTIVERT) 25 MG tablet Take 0.5 tablets (12.5 mg total) by mouth 3 (three) times daily as needed for dizziness. 30 tablet 0   metFORMIN (GLUCOPHAGE) 500 MG tablet Take 1 tablet (500 mg total) by mouth daily with breakfast. Overdue for Annual appt w/ labs must see provider for future  refills 30 tablet 0   montelukast (SINGULAIR) 10 MG tablet TAKE 1 TABLET BY MOUTH EVERYDAY AT BEDTIME 90 tablet 3   nitroGLYCERIN (NITROSTAT) 0.4 MG SL tablet PLACE 1 TABLET UNDER THE TONGUE EVERY 5 MINUTES AS NEEDED. FOR UP TO 3 DOSES. 25 tablet 2   pravastatin (PRAVACHOL) 40 MG tablet TAKE 1 TABLET BY MOUTH EVERY DAY 90 tablet 3   tiZANidine (ZANAFLEX) 4 MG tablet Take 1 tablet (4 mg total) by mouth every 6 (six) hours as needed for muscle spasms. 20 tablet 3   vitamin C (ASCORBIC ACID) 500 MG tablet Take 500 mg by mouth daily.      albuterol (PROAIR HFA) 108 (90 Base) MCG/ACT inhaler Inhale 1-2 puffs into the lungs every 6 (six) hours as needed for wheezing or shortness of breath. 18 g 2   carvedilol (COREG) 25 MG tablet Take 1 tablet (25 mg total) by mouth 2 (two) times daily with a meal. Annual appt due in Sept must see provider for future refills 180 tablet 1   HYDROcodone-acetaminophen (NORCO) 5-325 MG tablet Take 1 tablet by mouth every 6 (six) hours as needed. 20 tablet 0   traMADol (ULTRAM) 50 MG tablet Take 1-2 tablets (50-100 mg total) by mouth daily as needed. 20 tablet 0   traMADol (ULTRAM) 50 MG tablet Take 1-2  tablets (50-100 mg total) by mouth every 6 (six) hours as needed. 60 tablet 0   cetirizine (ZYRTEC) 10 MG tablet Take 1 tablet (10 mg total) by mouth daily. (Patient not taking: Reported on 02/17/2021) 30 tablet 11   No facility-administered medications prior to visit.    ROS: Review of Systems  Constitutional:  Negative for appetite change, fatigue and unexpected weight change.  HENT:  Negative for congestion, nosebleeds, sneezing, sore throat and trouble swallowing.   Eyes:  Negative for itching and visual disturbance.  Respiratory:  Negative for cough.   Cardiovascular:  Negative for chest pain, palpitations and leg swelling.  Gastrointestinal:  Negative for abdominal distention, blood in stool, diarrhea and nausea.  Genitourinary:  Negative for frequency and  hematuria.  Musculoskeletal:  Positive for arthralgias. Negative for back pain, gait problem, joint swelling and neck pain.  Skin:  Negative for rash.  Neurological:  Positive for weakness and numbness. Negative for dizziness, tremors and speech difficulty.  Psychiatric/Behavioral:  Negative for agitation, dysphoric mood, sleep disturbance and suicidal ideas. The patient is nervous/anxious.    Objective:  BP 130/64 (BP Location: Left Arm)   Pulse 61   Temp 97.6 F (36.4 C) (Oral)   Ht 5\' 9"  (1.753 m)   Wt 184 lb 12.8 oz (83.8 kg)   SpO2 97%   BMI 27.29 kg/m   BP Readings from Last 3 Encounters:  02/17/21 130/64  11/12/20 110/60  10/27/20 134/72    Wt Readings from Last 3 Encounters:  02/17/21 184 lb 12.8 oz (83.8 kg)  11/12/20 190 lb 8 oz (86.4 kg)  10/27/20 197 lb 6 oz (89.5 kg)    Physical Exam Constitutional:      General: He is not in acute distress.    Appearance: He is well-developed.     Comments: NAD  Eyes:     Conjunctiva/sclera: Conjunctivae normal.     Pupils: Pupils are equal, round, and reactive to light.  Neck:     Thyroid: No thyromegaly.     Vascular: No JVD.  Cardiovascular:     Rate and Rhythm: Normal rate and regular rhythm.     Heart sounds: Normal heart sounds. No murmur heard.   No friction rub. No gallop.  Pulmonary:     Effort: Pulmonary effort is normal. No respiratory distress.     Breath sounds: Normal breath sounds. No wheezing or rales.  Chest:     Chest wall: No tenderness.  Abdominal:     General: Bowel sounds are normal. There is no distension.     Palpations: Abdomen is soft. There is no mass.     Tenderness: There is no abdominal tenderness. There is no guarding or rebound.  Musculoskeletal:        General: No tenderness. Normal range of motion.     Cervical back: Normal range of motion.  Lymphadenopathy:     Cervical: No cervical adenopathy.  Skin:    General: Skin is warm and dry.     Findings: No rash.  Neurological:      Mental Status: He is alert and oriented to person, place, and time.     Cranial Nerves: No cranial nerve deficit.     Motor: No abnormal muscle tone.     Coordination: Coordination normal.     Gait: Gait normal.     Deep Tendon Reflexes: Reflexes are normal and symmetric.  Psychiatric:        Behavior: Behavior normal.  Thought Content: Thought content normal.        Judgment: Judgment normal.    Lab Results  Component Value Date   WBC 9.9 12/26/2019   HGB 14.5 12/26/2019   HCT 43.0 12/26/2019   PLT 179 12/26/2019   GLUCOSE 74 02/28/2020   CHOL 144 12/26/2019   TRIG 91 12/26/2019   HDL 49 12/26/2019   LDLCALC 78 12/26/2019   ALT 16 12/26/2019   AST 19 12/26/2019   NA 140 02/28/2020   K 3.8 02/28/2020   CL 102 02/28/2020   CREATININE 1.29 02/28/2020   BUN 21 02/28/2020   CO2 30 02/28/2020   TSH 1.91 11/29/2018   PSA 0.5 12/26/2019   INR 1.1 12/04/2019   HGBA1C 5.6 02/28/2020    MR Lumbar Spine w/o contrast  Result Date: 12/06/2020 CLINICAL DATA:  Lumbar radiculopathy. EXAM: MRI LUMBAR SPINE WITHOUT CONTRAST TECHNIQUE: Multiplanar, multisequence MR imaging of the lumbar spine was performed. No intravenous contrast was administered. COMPARISON:  None. FINDINGS: Segmentation:  Standard. Alignment:  2 mm retrolisthesis of L2 on L3, L3 on L4 and L4 on L5. Vertebrae: No acute fracture, evidence of discitis, or aggressive bone lesion. Conus medullaris and cauda equina: Conus extends to the L1 level. Conus and cauda equina appear normal. Paraspinal and other soft tissues: No acute paraspinal abnormality. Disc levels: Disc spaces: Degenerative disease with disc height loss at T12-L1, L1-2, L2-3, L3-4 and L4-5. T12-L1: No significant disc bulge. No neural foraminal stenosis. No central canal stenosis. L1-L2: No disc protrusion. Mild left foraminal stenosis. No right foraminal stenosis. Mild bilateral facet arthropathy. L2-L3: Mild broad-based disc bulge. Moderate bilateral facet  arthropathy. Mild spinal stenosis. Moderate left and mild right foraminal stenosis. L3-L4: Mild broad-based disc bulge. Mild bilateral facet arthropathy. Bilateral lateral recess stenosis. Mild right and moderate left foraminal stenosis. No central canal stenosis. L4-L5: Broad-based disc bulge. Moderate bilateral facet arthropathy. Moderate right foraminal stenosis. No left foraminal stenosis. No spinal stenosis. L5-S1: Mild broad-based disc bulge. Moderate bilateral facet arthropathy, right worse than left. Reactive marrow edema involving the right facet joint. Right L5 pars interarticularis defect. Mild right foraminal stenosis. No left foraminal stenosis. IMPRESSION: 1. Lumbar spine spondylosis as described above. 2. No acute osseous injury of the lumbar spine. Electronically Signed   By: Kathreen Devoid M.D.   On: 12/06/2020 08:22   MR IEPPI RIGHT WO CONTRAST  Result Date: 12/05/2020 CLINICAL DATA:  Posterior right thigh pain for several months. Stress fracture suspected. EXAM: MRI OF THE RIGHT FEMUR WITHOUT CONTRAST TECHNIQUE: Multiplanar, multisequence MR imaging of the right femur was performed. No intravenous contrast was administered. COMPARISON:  X-ray 05/31/2017 FINDINGS: Bones/Joint/Cartilage No acute fracture. No dislocation. No significant bone marrow edema. No periostitis. No suspicious marrow replacing bone lesion. Small bilateral knee joint effusions are evident. Evaluation for internal derangement is limited on large field-of-view imaging. Ligaments Intact. Muscles and Tendons Complete tear of the hamstring component of the adductor magnus tendon origin from the ischial tuberosity (series 8, images 11-13; series 7, image 19) with approximately 2.4 cm retraction with fluid-filled gap. Tendinosis with low-grade partial tearing of the semimembranosus tendon as well as the conjoint tendon of the long head biceps femoris and semitendinosus without a full-thickness or retracted component. Intramuscular  edema within the proximal hamstring compartment. Origins of the contralateral left hamstring tendons appear intact on limited view. Soft tissues No well-defined hematoma. No mass identified within the soft tissues. Small right and trace left hydroceles. IMPRESSION: 1. Complete tear of  the hamstring component of the adductor magnus tendon origin from the ischial tuberosity with approximately 2.4 cm retraction with fluid-filled gap. 2. Tendinosis with low-grade partial tearing of the semimembranosus tendon as well as the conjoint tendon of the long head biceps femoris and semitendinosus without a full-thickness or retracted component. Electronically Signed   By: Davina Poke D.O.   On: 12/05/2020 15:59    Assessment & Plan:   Problem List Items Addressed This Visit     Anxiety state    Xanax prn  Potential benefits of a long term benzodiazepines  use as well as potential risks  and complications were explained to the patient and were aknowledged.      CAD (coronary artery disease)    Coreg, amlodipine, Lasix, Plavix      Chronic renal insufficiency, stage 3 (moderate) (HCC)    Cont with Coreg, Hydralazine      Hip pain, chronic, right    Will try Tramadol - low dose  Potential benefits of a long term opioids use as well as potential risks (i.e. addiction risk, apnea etc) and complications (i.e. Somnolence, constipation and others) were explained to the patient and were aknowledged.      Relevant Medications   traMADol (ULTRAM) 50 MG tablet   History of CVA (cerebrovascular accident)    S/p left thalamus infarct. There is no brain hemorrhage Plavix, Pravastatin Pt stopped Lyrica for R fingers, cheek tingling  Try [place] mane Will try Tramadol - low dose if tolerated      Mild cognitive disorder    Post-CVA. Better         Meds ordered this encounter  Medications   traMADol (ULTRAM) 50 MG tablet    Sig: Take 1 tablet (50 mg total) by mouth 4 (four) times daily.     Dispense:  60 tablet    Refill:  0      Follow-up: Return for a follow-up visit.  [name], MD

## 2021-02-17 NOTE — Assessment & Plan Note (Signed)
Coreg, amlodipine, Lasix, Plavix

## 2021-02-21 ENCOUNTER — Other Ambulatory Visit: Payer: Self-pay | Admitting: Internal Medicine

## 2021-02-21 DIAGNOSIS — E785 Hyperlipidemia, unspecified: Secondary | ICD-10-CM

## 2021-03-03 ENCOUNTER — Ambulatory Visit: Payer: Medicare Other | Admitting: Adult Health

## 2021-03-03 ENCOUNTER — Encounter: Payer: Self-pay | Admitting: Adult Health

## 2021-03-03 VITALS — BP 115/64 | HR 57 | Ht 69.0 in | Wt 186.0 lb

## 2021-03-03 DIAGNOSIS — G890 Central pain syndrome: Secondary | ICD-10-CM | POA: Diagnosis not present

## 2021-03-03 DIAGNOSIS — I6381 Other cerebral infarction due to occlusion or stenosis of small artery: Secondary | ICD-10-CM | POA: Diagnosis not present

## 2021-03-03 NOTE — Patient Instructions (Addendum)
You can try compression glove to help with the nerve pain in your right hand - let me know if you would like to try therapy for potential benefit of right hand pain  Continue clopidogrel 75 mg daily  and pravastatin and Zetia  for secondary stroke prevention  Continue to follow up with PCP regarding cholesterol and blood pressure management  Maintain strict control of hypertension with blood pressure goal below 130/90 and cholesterol with LDL cholesterol (bad cholesterol) goal below 70 mg/dL.      Followup in the future with me in 6 months or call earlier if needed      Thank you for coming to see [place] at [facility]. I hope we have been able to provide you high quality care today.  You may receive a patient satisfaction survey over the next few weeks. We would appreciate your feedback and comments so that we may continue to improve ourselves and the health of our patients.

## 2021-03-03 NOTE — Progress Notes (Signed)
[facility] [address] [place]. [place] [address] [phone]       OFFICE FOLLOW UP NOTE  Larry Stone Date of Birth:  [birth year] Medical Record Number:  [id number] Take care: Referring MD: [name]  Reason for Referral: Stroke   Chief Complaint  Patient presents with   Follow-up    RM  3 alone Pt states things aren't much different than last visit. Still having pain on R side face and hand. Mention torn ligament in leg causing more pain.        HPI:   Update 03/03/2021 JM: Returns for 15-month stroke follow-up unaccompanied  Overall stable -denies new stroke/TIA symptoms Continued right hand and face post stroke pain - previously rx'd amitriptyline (for both pain and anxiety) but did not take due to interaction with bupropion.  Prior intolerance to topiramate and pregabalin.  He does continue to experience anxiety -currently on bupropion and Xanax managed by PCP Has been dealing with right hamstring tear - has been placed on tramadol due to severe pain - questions if tramadol has been helping post stroke pain some or if he hasn't been as focused on post stroke pain due to severe pain of right thigh - being followed by ortho Dr. Erlinda Hong  Compliant on Plavix, pravastatin and Zetia -denies side effects Blood pressure today 115/64 Recent A1c 5.8 on metformin 500 mg daily  No new concerns at this time    History provided for reference purposes only Update 10/27/2020 JM: Larry Stone returns for 4 month stroke follow-up unaccompanied.  Stable from stroke standpoint without new stroke/TIA symptoms.  He continues to have difficulty with right face, hand and occasional foot paresthesias describing as burning and electrical shock sensation.  He has previously trialed pregabalin and topiramate but unable to tolerate.  Previously complained worsening gait difficulty, slurred speech and cognitive impairment after initiating pregabalin which resolved shortly  after discontinuing.  He is currently participating in PT for right posterior thigh pain followed by orthopedics [name].  He also reports memory changes since his stroke including increased irritability and low tolerance levels - hx of depression and anxiety currently on bupropion and Xanax as needed per PCP. Reports compliance on Plavix, pravastatin and Zetia without associated side effects.  Blood pressure today 134/72.  No further concerns at this time.   Update 06/19/2020 JM: Larry Stone returns for stroke follow-up after prior visit approximately 5 months ago with [name].  Topiramate initiated at prior visit for poststroke paresthesias but unable to tolerate. Placed on Lyrica 50 mg twice daily beginning of November and reports approximately 2 weeks after, he was noticing increased lethargy and fluctuation of gait impairment with imbalance (getting pulled to the right) and lightheadedness, slurred speech and cognitive difficulties such as delayed responses and short-term memory difficulties.  Gait impairment with imbalance and slurred speech all present initially with stroke.  Denies new stroke/TIA symptoms.   He is unsure if Lyrica was beneficial for paresthesias as he does report some improvement since prior visit but right hand and right facial paresthesias still present. He also admits to struggling with depression with history of depression PTA on bupropion 150 mg twice daily but noticed worsening after his stroke.  He is also on Xanax as needed per PCP He has been trying to keep active and does report doing HEP daily as advised during therapy sessions  Reports compliance on Plavix and pravastatin and Zetia-denies side effects Blood pressure today 135/66  No further  concerns at this time  Initial visit 02/06/2020 [name]: Larry Stone is a pleasant 70 year old Caucasian male with past medical history of hypertension, hyper lipidemia, diabetes, congestive heart failure and coronary artery  disease who developed sudden onset of right lower face and hand numbness as well as imbalance and difficulty walking on 12/03/2019.  He did not seek medical help the same day as if waited for it to get better.  Next day he went to the hospital but got tired waiting in the ER since he was not seen and instead went to see his primary physician Dr. [name] who ordered an outpatient stroke work-up.  MRI scan of the brain done on 12/20/2019 shows a subacute left thalamic lacunar infarct.  Carotid ultrasound on 12/10/2019 showed no significant extracranial stenosis.  Echocardiogram on 12/28/2018 showed slightly diminished ejection fraction of 45 to 50% but no clot or definite cardiac source of embolism was noted.  He underwent 30 days of outpatient telemetry cardiac monitoring which was negative for atrial fibrillation or significant arrhythmias.  Carotid ultrasound on 12/10/2019 was unremarkable.  LDL cholesterol was 78 mg percent on 12/26/2019.  Patient is presently on Plavix 75 mg daily which is tolerating well without bruising or bleeding.  He states his gait and balance have improved and he has finished outpatient physical and occupational therapy.  He still stumbles occasionally but as long to walk slowly and carefully.  He has had no falls or injuries.  He however still has persistent paresthesias in his right face and right hand which are bothersome and he would like to try some medications for this.  He has had no recurrent stroke or TIA symptoms.  States his blood pressure is well controlled and today it is 113/65 in office.  States his sugars are also doing well however he cannot tell me when his last hemoglobin A1c was checked.  He has been scheduled to undergo loop recorder pending my consultation visit today.  ROS:   14 system review of systems is positive for those listed in HPI and all other systems negative  PMH:  Past Medical History:  Diagnosis Date   Anxiety    CAD    a. acute inferior lateral wall  infarction in September 2004 treated medically. b.  cath 06/17/16 showing mild nonobstructive CAD with 30-40% ostial LM (eccentric), elevated LV filling pressures and normal LV function.   Chronic diastolic CHF (congestive heart failure) (HCC)    CKD (chronic kidney disease), stage II    COPD (chronic obstructive pulmonary disease) (HCC)    Diabetes mellitus    Diverticulosis    DJD (degenerative joint disease)    GERD (gastroesophageal reflux disease)    HTN (hypertension)    Hyperlipidemia    Low back pain syndrome    Myocardial infarction ([place]) 2004   Obesity    OSA (obstructive sleep apnea)    RBBB    Recurrent aspiration bronchitis/pneumonia    Recurrent aspiration pneumonia ([place])    [name] 07/13/2016   Thrombocytopenia (HCC)    Tubular adenoma of colon 2014    Social History:  Social History   Socioeconomic History   Marital status: Married    Spouse name: Not on file   Number of children: 1   Years of education: Not on file   Highest education level: Not on file  Occupational History   Occupation: [employer]  Tobacco Use   Smoking status: Former    Packs/day: 1.00    Years:  20.00    Pack years: 20.00    Types: Cigarettes, Cigars    Quit date: 05/08/1998    Years since quitting: 22.8   Smokeless tobacco: Never   Tobacco comments:    quit in 2005  Vaping Use   Vaping Use: Never used  Substance and Sexual Activity   Alcohol use: Yes    Alcohol/week: 1.0 standard drink    Types: 1 Standard drinks or equivalent per week    Comment: social drinker   Drug use: No   Sexual activity: Yes  Other Topics Concern   Not on file  Social History Narrative   Lives with spouse only   Right Handed   Drinks <1 cup caffeine daily   Social Determinants of Health   Financial Resource Strain: Low Risk    Difficulty of Paying Living Expenses: Not hard at all  Food Insecurity: No Food Insecurity   Worried About Charity fundraiser in the Last Year:  Never true   [place] in the Last Year: Never true  Transportation Needs: No Transportation Needs   Lack of Transportation (Medical): No   Lack of Transportation (Non-Medical): No  Physical Activity: Inactive   Days of Exercise per Week: 0 days   Minutes of Exercise per Session: 0 min  Stress: No Stress Concern Present   Feeling of Stress : Not at all  Social Connections: Unknown   Frequency of Communication with Friends and Family: More than three times a week   Frequency of Social Gatherings with Friends and Family: More than three times a week   Attends Religious Services: Patient refused   Marine scientist or Organizations: Patient refused   Attends Archivist Meetings: Patient refused   Marital Status: Patient refused  Intimate Production manager Violence: Not on file    Medications:   Current Outpatient Medications on File Prior to Visit  Medication Sig Dispense Refill   albuterol (PROVENTIL) (2.5 MG/3ML) 0.083% nebulizer solution Use one vial in the nebulizer every 4-6 hours if needed for cough or wheeze 225 mL 11   ALPRAZolam (XANAX) 0.5 MG tablet Take 1 tablet (0.5 mg total) by mouth 3 (three) times daily as needed. 270 tablet 1   ammonium lactate (LAC-HYDRIN) 12 % lotion APPLY TOPICALLY AS DIRECTED. 400 mL 3   budesonide-formoterol (SYMBICORT) 160-4.5 MCG/ACT inhaler INHALE 2 PUFFS INTO THE LUNGS 2 (TWO) TIMES DAILY. 3 Inhaler 0   buPROPion (WELLBUTRIN SR) 150 MG 12 hr tablet Take 1 tablet (150 mg total) by mouth 2 (two) times daily. 180 tablet 3   carvedilol (COREG) 25 MG tablet Take 1 tablet (25 mg total) by mouth 2 (two) times daily with a meal. 180 tablet 3   Cholecalciferol (VITAMIN D3) 5000 UNITS CAPS Take one tablet by mouth daily     clopidogrel (PLAVIX) 75 MG tablet TAKE 1 TABLET BY MOUTH EVERY DAY 90 tablet 3   clotrimazole-betamethasone (LOTRISONE) cream APPLY AS DIRECTED 45 g 11   Coenzyme Q10 (COQ10) 100 MG CAPS Take as directed 30 each 11    ezetimibe (ZETIA) 10 MG tablet TAKE 1 TABLET BY MOUTH EVERY DAY 90 tablet 3   hydrALAZINE (APRESOLINE) 50 MG tablet Take 1 tablet (50 mg total) by mouth 4 (four) times daily. 360 tablet 3   meclizine (ANTIVERT) 25 MG tablet Take 0.5 tablets (12.5 mg total) by mouth 3 (three) times daily as needed for dizziness. 30 tablet 0   metFORMIN (GLUCOPHAGE) 500 MG tablet Take  1 tablet (500 mg total) by mouth daily with breakfast. Overdue for Annual appt w/ labs must see provider for future refills 30 tablet 0   montelukast (SINGULAIR) 10 MG tablet TAKE 1 TABLET BY MOUTH EVERYDAY AT BEDTIME 90 tablet 3   nitroGLYCERIN (NITROSTAT) 0.4 MG SL tablet PLACE 1 TABLET UNDER THE TONGUE EVERY 5 MINUTES AS NEEDED. FOR UP TO 3 DOSES. 25 tablet 2   pravastatin (PRAVACHOL) 40 MG tablet TAKE 1 TABLET BY MOUTH EVERY DAY 90 tablet 3   tiZANidine (ZANAFLEX) 4 MG tablet Take 1 tablet (4 mg total) by mouth every 6 (six) hours as needed for muscle spasms. 20 tablet 3   traMADol (ULTRAM) 50 MG tablet Take 1 tablet (50 mg total) by mouth 4 (four) times daily. 60 tablet 0   vitamin C (ASCORBIC ACID) 500 MG tablet Take 500 mg by mouth daily.      No current facility-administered medications on file prior to visit.    Allergies:   Allergies  Allergen Reactions   Ace Inhibitors Swelling   Angiotensin Receptor Blockers Swelling   Aspirin Hives   Amlodipine     Leg swelling   Crestor [Rosuvastatin Calcium]     myalgias   Lipitor [Atorvastatin Calcium]     arthralgias   Codeine Nausea And Vomiting   Irbesartan Other (See Comments)    REACTION: allergic to ARBs w/ angioedema   Ramipril Other (See Comments)    REACTION: Allergic to ACE's w/ angioedema...    Physical Exam Today's Vitals   03/03/21 1056  BP: 115/64  Pulse: (!) 57  Weight: 186 lb (84.4 kg)  Height: 5\' 9"  (1.753 m)   Body mass index is 27.47 kg/m.   General: well developed, well nourished pleasant elderly Caucasian male, seated, in no evident  distress Head: head normocephalic and atraumatic.   Neck: supple with no carotid or supraclavicular bruits Cardiovascular: regular rate and rhythm, no murmurs Musculoskeletal: no deformity Skin:  no rash/petichiae Vascular:  Normal pulses all extremities  Neurologic Exam Mental Status: Awake and fully alert.  Fluent speech and language. Oriented to place and time. Recent and remote memory intact. Attention span, concentration and fund of knowledge appropriate during visit. Mood and affect appropriate.  Cranial Nerves: Pupils equal, briskly reactive to light. Extraocular movements full without nystagmus. Visual fields full to confrontation. Hearing intact. Facial sensation intact.  Mild right nasolabial fold flattening.  Tongue, palate moves normally and symmetrically.  Motor: Normal bulk and tone. Normal strength in all tested extremity muscles. Sensory.: intact to touch , pinprick , position and vibratory sensation -subjective paresthesias in the right face and hand. Coordination: Rapid alternating movements normal in all extremities. Finger-to-nose and heel-to-shin performed accurately bilaterally. Gait and Station: Arises from chair without difficulty. Stance is normal. Gait demonstrates normal stride length and balance without use of assistive device.  Mild to moderate difficulty with heel, toe and tandem walk.   Reflexes: 1+ and symmetric. Toes downgoing.        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  Vascular risk factors of hypertension hyperlipidemia coronary artery disease.      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 Unable to rx antidepressants due to  potential interaction with bupropion.  Advised to discuss with PCP possible change of current antidepressant to one that may help with both pain and continued anxiety symptoms (such as duloxetine, amitriptyline or venlafaxine)  -Discussed use of compression glove for hand dysesthesias -Discussed difficulty treating this type of pain and unfortunately, limited further treatment options for persistent symptoms. May potentially benefit from therapy - advised to call if interested in pursuing in the future -Continue Plavix 75 mg daily, pravastatin and Zetia for secondary stroke prevention -Discussed secondary stroke prevention measures and importance of close PCP follow-up for aggressive stroke risk factor management -HTN: BP goal<130/90.  Stable today monitored by PCP/cardiology -HLD: LDL goal<70. On pravastatin and zetia per PCP.  -DM: A1c goal<7.0.  A1c 5.8 (02/2021) on Metformin per PCP    Follow-up in 6 months or call earlier if needed   CC:  Plotnikov, Evie Lacks, MD   I spent 33 minutes of face-to-face and non-face-to-face time with patient.  This included previsit chart review, lab review, study review, order entry, electronic health record documentation, patient education and discussion regarding history of prior stroke including secondary stroke prevention and aggressive stroke risk factor management, residual deficits including post stroke pain syndrome and further treatment options and answered all other questions to patient satisfaction  Frann Rider, AGNP-BC  [facility] [address] [place], [place] [address]  Phone [phone] Fax [phone] Note: This document was prepared with digital dictation and possible smart phrase technology. Any transcriptional errors that result from this process are unintentional.

## 2021-03-08 ENCOUNTER — Other Ambulatory Visit: Payer: Self-pay | Admitting: Cardiovascular Disease

## 2021-03-08 ENCOUNTER — Other Ambulatory Visit: Payer: Self-pay | Admitting: Internal Medicine

## 2021-03-09 NOTE — Telephone Encounter (Signed)
Check [place] registry last filled 02/17/2021. MD is out of the office until December 1st. Please advise.Marland KitchenJohny Chess

## 2021-03-16 ENCOUNTER — Encounter: Payer: Self-pay | Admitting: Gastroenterology

## 2021-03-18 ENCOUNTER — Other Ambulatory Visit: Payer: Self-pay | Admitting: Cardiovascular Disease

## 2021-03-31 ENCOUNTER — Other Ambulatory Visit: Payer: Self-pay | Admitting: Orthopaedic Surgery

## 2021-03-31 ENCOUNTER — Other Ambulatory Visit: Payer: Self-pay | Admitting: Internal Medicine

## 2021-03-31 NOTE — Telephone Encounter (Signed)
Ok to pcp please °

## 2021-04-06 NOTE — Addendum Note (Signed)
Addended by: Earnstine Regal on: 04/06/2021 08:49 AM   Modules accepted: Orders

## 2021-04-07 ENCOUNTER — Other Ambulatory Visit: Payer: Self-pay

## 2021-04-07 ENCOUNTER — Ambulatory Visit (INDEPENDENT_AMBULATORY_CARE_PROVIDER_SITE_OTHER): Payer: Medicare Other

## 2021-04-07 DIAGNOSIS — Z0000 Encounter for general adult medical examination without abnormal findings: Secondary | ICD-10-CM

## 2021-04-07 NOTE — Progress Notes (Addendum)
I connected with Larry Stone today by telephone and verified that I am speaking with the correct person using two identifiers. Location patient: home Location provider: work Persons participating in the virtual visit: patient, provider.   I discussed the limitations, risks, security and privacy concerns of performing an evaluation and management service by telephone and the availability of in person appointments. I also discussed with the patient that there may be a patient responsible charge related to this service. The patient expressed understanding and verbally consented to this telephonic visit.    Interactive audio and video telecommunications were attempted between this provider and patient, however failed, due to patient having technical difficulties OR patient did not have access to video capability.  We continued and completed visit with audio only.  Some vital signs may be absent or patient reported.   Time Spent with patient on telephone encounter: 40 minutes  Subjective:   Larry Stone is a 70 y.o. male who presents for Medicare Annual/Subsequent preventive examination.  Review of Systems     Cardiac Risk Factors include: advanced age (>50men, >11 women);family history of premature cardiovascular disease;dyslipidemia;hypertension;male gender     Objective:    There were no vitals filed for this visit. There is no height or weight on file to calculate BMI.  Advanced Directives 04/07/2021 08/19/2020 02/28/2020 12/07/2019 12/04/2019 12/06/2018 10/04/2017  Does Patient Have a Medical Advance Directive? Yes Yes Yes No No Yes Yes  Type of Advance Directive Living will;Healthcare Power of Attorney - Living will;Healthcare Power of [place];Living will -  Does patient want to make changes to medical advance directive? No - Patient declined No - Patient declined No - Patient declined - - - -  Copy of [place] in Chart? No - copy  requested - No - copy requested - - No - copy requested -  Would patient like information on creating a medical advance directive? - - - No - Patient declined No - Patient declined - -    Current Medications (verified) Outpatient Encounter Medications as of 04/07/2021  Medication Sig   albuterol (PROVENTIL) (2.5 MG/3ML) 0.083% nebulizer solution Use one vial in the nebulizer every 4-6 hours if needed for cough or wheeze   ALPRAZolam (XANAX) 0.5 MG tablet Take 1 tablet (0.5 mg total) by mouth 3 (three) times daily as needed.   ammonium lactate (LAC-HYDRIN) 12 % lotion APPLY TOPICALLY AS DIRECTED.   budesonide-formoterol (SYMBICORT) 160-4.5 MCG/ACT inhaler INHALE 2 PUFFS INTO THE LUNGS 2 (TWO) TIMES DAILY.   buPROPion (WELLBUTRIN SR) 150 MG 12 hr tablet Take 1 tablet (150 mg total) by mouth 2 (two) times daily.   carvedilol (COREG) 25 MG tablet Take 1 tablet (25 mg total) by mouth 2 (two) times daily with a meal.   Cholecalciferol (VITAMIN D3) 5000 UNITS CAPS Take one tablet by mouth daily   clopidogrel (PLAVIX) 75 MG tablet TAKE 1 TABLET BY MOUTH EVERY DAY   clotrimazole-betamethasone (LOTRISONE) cream APPLY AS DIRECTED   Coenzyme Q10 (COQ10) 100 MG CAPS Take as directed   ezetimibe (ZETIA) 10 MG tablet Take 1 tablet (10 mg total) by mouth daily. Please make yearly appt with Dr. Angelena Form for December 2022 for future refills. Thank you 1st attempt   hydrALAZINE (APRESOLINE) 50 MG tablet Take 1 tablet (50 mg total) by mouth 4 (four) times daily.   meclizine (ANTIVERT) 25 MG tablet Take 0.5 tablets (12.5 mg total) by mouth 3 (three) times daily as  needed for dizziness.   metFORMIN (GLUCOPHAGE) 500 MG tablet Take 1 tablet (500 mg total) by mouth daily with breakfast. Overdue for Annual appt w/ labs must see provider for future refills   montelukast (SINGULAIR) 10 MG tablet TAKE 1 TABLET BY MOUTH EVERYDAY AT BEDTIME   nitroGLYCERIN (NITROSTAT) 0.4 MG SL tablet Place 1 tablet (0.4 mg total) under the  tongue every 5 (five) minutes as needed for chest pain. Up to 3 doses. Please make yearly appt with Dr. Angelena Form for December 2022 for future refills. Thank you 1st attempt   pravastatin (PRAVACHOL) 40 MG tablet TAKE 1 TABLET BY MOUTH EVERY DAY   tiZANidine (ZANAFLEX) 4 MG tablet TAKE 1 TABLET BY MOUTH EVERY 6 HOURS AS NEEDED FOR MUSCLE SPASMS.   traMADol (ULTRAM) 50 MG tablet TAKE 1 TABLET BY MOUTH FOUR TIMES A DAY   vitamin C (ASCORBIC ACID) 500 MG tablet Take 500 mg by mouth daily.    No facility-administered encounter medications on file as of 04/07/2021.    Allergies (verified) Ace inhibitors, Angiotensin receptor blockers, Aspirin, Amlodipine, Crestor [rosuvastatin calcium], Lipitor [atorvastatin calcium], Codeine, Irbesartan, and Ramipril   History: Past Medical History:  Diagnosis Date   Anxiety    CAD    a. acute inferior lateral wall infarction in September 2004 treated medically. b.  cath 06/17/16 showing mild nonobstructive CAD with 30-40% ostial LM (eccentric), elevated LV filling pressures and normal LV function.   Chronic diastolic CHF (congestive heart failure) (HCC)    CKD (chronic kidney disease), stage II    COPD (chronic obstructive pulmonary disease) (HCC)    Diabetes mellitus    Diverticulosis    DJD (degenerative joint disease)    GERD (gastroesophageal reflux disease)    HTN (hypertension)    Hyperlipidemia    Low back pain syndrome    Myocardial infarction ([place]) 2004   Obesity    OSA (obstructive sleep apnea)    RBBB    Recurrent aspiration bronchitis/pneumonia    Recurrent aspiration pneumonia ([place])    [name] 07/13/2016   Thrombocytopenia (HCC)    Tubular adenoma of colon 2014   Past Surgical History:  Procedure Laterality Date   CARDIAC CATHETERIZATION     LEFT HEART CATH AND CORONARY ANGIOGRAPHY N/A 06/17/2016   Procedure: Left Heart Cath and Coronary Angiography;  Surgeon: Burnell Blanks, MD;  Location: [facility];  Service:  Cardiovascular;  Laterality: N/A;   UVULOPALATOPHARYNGOPLASTY     surgery for OSA   Family History  Problem Relation Age of Onset   Colon cancer Father    Stroke Mother    Heart disease Mother    Hypertension Sister    Diabetes Sister    Colon cancer Paternal Uncle    Social History   Socioeconomic History   Marital status: Married    Spouse name: Not on file   Number of children: 1   Years of education: Not on file   Highest education level: Not on file  Occupational History   Occupation: Probation officer of [employer]  Tobacco Use   Smoking status: Former    Packs/day: 1.00    Years: 20.00    Pack years: 20.00    Types: Cigarettes, Cigars    Quit date: 05/08/1998    Years since quitting: 22.9   Smokeless tobacco: Never   Tobacco comments:    quit in 2005  Vaping Use   Vaping Use: Never used  Substance and Sexual Activity   Alcohol use: Yes  Alcohol/week: 1.0 standard drink    Types: 1 Standard drinks or equivalent per week    Comment: social drinker   Drug use: No   Sexual activity: Yes  Other Topics Concern   Not on file  Social History Narrative   Lives with spouse only   Right Handed   Drinks <1 cup caffeine daily   Social Determinants of Health   Financial Resource Strain: Low Risk    Difficulty of Paying Living Expenses: Not hard at all  Food Insecurity: No Food Insecurity   Worried About Charity fundraiser in the Last Year: Never true   Ran Out of Food in the Last Year: Never true  Transportation Needs: No Transportation Needs   Lack of Transportation (Medical): No   Lack of Transportation (Non-Medical): No  Physical Activity: Sufficiently Active   Days of Exercise per Week: 5 days   Minutes of Exercise per Session: 30 min  Stress: No Stress Concern Present   Feeling of Stress : Not at all  Social Connections: Unknown   Frequency of Communication with Friends and Family: More than three times a week   Frequency of Social Gatherings  with Friends and Family: More than three times a week   Attends Religious Services: Patient refused   Marine scientist or Organizations: Patient refused   Attends Music therapist: Patient refused   Marital Status: Married    Tobacco Counseling Counseling given: Not Answered Tobacco comments: quit in 2005   Clinical Intake:  Pre-visit preparation completed: Yes  Pain : No/denies pain     Nutritional Risks: None Diabetes: No  How often do you need to have someone help you when you read instructions, pamphlets, or other written materials from your doctor or pharmacy?: 1 - Never What is the last grade level you completed in school?: 3.5 years of college  Diabetic? no  Interpreter Needed?: No  Information entered by :: Lisette Abu, LPN   Activities of Daily Living In your present state of health, do you have any difficulty performing the following activities: 04/07/2021  Hearing? N  Vision? N  Difficulty concentrating or making decisions? N  Walking or climbing stairs? N  Dressing or bathing? N  Doing errands, shopping? N  Preparing Food and eating ? N  Using the Toilet? N  In the past six months, have you accidently leaked urine? N  Do you have problems with loss of bowel control? N  Managing your Medications? N  Managing your Finances? N  Housekeeping or managing your Housekeeping? N  Some recent data might be hidden    Patient Care Team: Plotnikov, Evie Lacks, MD as PCP - General (Internal Medicine) Burnell Blanks, MD as PCP - Cardiology (Cardiology) Burnell Blanks, MD as Consulting Physician (Cardiology) Loletha Carrow Kirke Corin, MD as Consulting Physician (Gastroenterology) Garvin Fila, MD as Consulting Physician (Neurology) Rosita Fire, MD as Consulting Physician (Nephrology)  Indicate any recent Medical Services you may have received from other than [facility] providers in the past year (date may be  approximate).     Assessment:   This is a routine wellness examination for Elizabeth.  Hearing/Vision screen Hearing Screening - Comments:: Patient wears hearing aids. Vision Screening - Comments:: Patient wears eyeglasses.    Dietary issues and exercise activities discussed: Current Exercise Habits: Home exercise routine, Time (Minutes): 30, Frequency (Times/Week): 5, Weekly Exercise (Minutes/Week): 150, Intensity: Mild, Exercise limited by: respiratory conditions(s);cardiac condition(s)   Goals Addressed  None   Depression Screen PHQ 2/9 Scores 04/07/2021 02/17/2021 09/01/2020 06/03/2020 02/28/2020 12/26/2019 12/06/2018  PHQ - 2 Score 0 0 2 0 2 0 2  PHQ- 9 Score - 0 3 0 - - 3    Fall Risk Fall Risk  04/07/2021 02/28/2020 12/26/2019 12/06/2018 07/12/2017  Falls in the past year? 1 0 0 0 No  Number falls in past yr: 0 0 0 0 -  Injury with Fall? 1 0 0 0 -  Risk for fall due to : - No Fall Risks - - -  Follow up Falls prevention discussed Falls evaluation completed - - -    FALL RISK PREVENTION PERTAINING TO THE HOME:  Any stairs in or around the home? Yes  If so, are there any without handrails? No  Home free of loose throw rugs in walkways, pet beds, electrical cords, etc? Yes  Adequate lighting in your home to reduce risk of falls? Yes   ASSISTIVE DEVICES UTILIZED TO PREVENT FALLS:  Life alert? No  Use of a cane, walker or w/c? No  Grab bars in the bathroom? Yes  Shower chair or bench in shower? Yes  Elevated toilet seat or a handicapped toilet? Yes   TIMED UP AND GO:  Was the test performed? No .  Length of time to ambulate 10 feet: n/a sec.   Gait steady and fast without use of assistive device  Cognitive Function: Normal cognitive status assessed by direct observation by this Nurse Health Advisor. No abnormalities found.       6CIT Screen 02/28/2020  What Year? 0 points  What month? 0 points  What time? 0 points  Count back from 20 0 points  Months in reverse 0  points  Repeat phrase 0 points  Total Score 0    Immunizations Immunization History  Administered Date(s) Administered   Fluad Quad(high Dose 65+) 12/11/2018   Influenza Split 02/02/2011, 03/03/2012   Influenza Whole 02/02/2010   Influenza, High Dose Seasonal PF 02/20/2013, 01/16/2015, 12/16/2015, 12/22/2016, 01/04/2018, 01/08/2020, 01/21/2021   Influenza, Seasonal, Injecte, Preservative Fre 02/21/2014   Influenza-Unspecified 01/08/2020   Moderna Sars-Covid-2 Vaccination 05/24/2019, 06/22/2019, 01/15/2020   PNEUMOCOCCAL CONJUGATE-20 01/21/2021   Pneumococcal Conjugate-13 03/05/2014, 12/26/2019   Pneumococcal Polysaccharide-23 06/18/2010, 09/03/2015   Tdap 11/14/2012   Zoster Recombinat (Shingrix) 12/22/2016, 03/14/2017   Zoster, Live 09/11/2012    TDAP status: Up to date  Flu Vaccine status: Up to date  Pneumococcal vaccine status: Up to date  Covid-19 vaccine status: Completed vaccines  Qualifies for Shingles Vaccine? Yes   Zostavax completed Yes   Shingrix Completed?: Yes  Screening Tests Health Maintenance  Topic Date Due   COVID-19 Vaccine (4 - Booster for Moderna series) 03/11/2020   COLONOSCOPY (Pts 45-34yrs Insurance coverage will need to be confirmed)  10/04/2020   TETANUS/TDAP  11/15/2022   Pneumonia Vaccine 43+ Years old  Completed   INFLUENZA VACCINE  Completed   Hepatitis C Screening  Completed   Zoster Vaccines- Shingrix  Completed   HPV VACCINES  Aged Out    Health Maintenance  Health Maintenance Due  Topic Date Due   COVID-19 Vaccine (4 - Booster for Moderna series) 03/11/2020   COLONOSCOPY (Pts 45-13yrs Insurance coverage will need to be confirmed)  10/04/2020    Colorectal cancer screening: Type of screening: Colonoscopy. Completed 10/04/2017. Repeat every 3 years (patient has been notified; consultation scheduled for 05/2021)  Lung Cancer Screening: (Low Dose CT Chest recommended if Age 34-80 years, 30 pack-year currently smoking  OR have quit  w/in 15years.) does not qualify.   Lung Cancer Screening Referral: no  Additional Screening:  Hepatitis C Screening: does qualify; Completed yes  Vision Screening: Recommended annual ophthalmology exams for early detection of glaucoma and other disorders of the eye. Is the patient up to date with their annual eye exam?  No  Who is the provider or what is the name of the office in which the patient attends annual eye exams? Patient could not remember name of eye doctor If pt is not established with a provider, would they like to be referred to a provider to establish care? No .   Dental Screening: Recommended annual dental exams for proper oral hygiene  Community Resource Referral / Chronic Care Management: CRR required this visit?  No   CCM required this visit?  No      Plan:     I have personally reviewed and noted the following in the patients chart:   Medical and social history Use of alcohol, tobacco or illicit drugs  Current medications and supplements including opioid prescriptions. Patient is not currently taking opioid prescriptions. Functional ability and status Nutritional status Physical activity Advanced directives List of other physicians Hospitalizations, surgeries, and ER visits in previous 12 months Vitals Screenings to include cognitive, depression, and falls Referrals and appointments  In addition, I have reviewed and discussed with patient certain preventive protocols, quality metrics, and best practice recommendations. A written personalized care plan for preventive services as well as general preventive health recommendations were provided to patient.     Sheral Flow, LPN   10/13/9483   Nurse Notes: Patient is cogitatively intact. There were no vitals filed for this visit. There is no height or weight on file to calculate BMI. Patient stated that he has no issues with gait or balance; does not use any assistive devices. Medications  reviewed with patient; no opioid use noted. Hearing Screening - Comments:: Patient wears hearing aids. Vision Screening - Comments:: Patient wears eyeglasses.    Medical screening examination/treatment/procedure(s) were performed by non-physician practitioner and as supervising physician I was immediately available for consultation/collaboration.  I agree with above. Lew Dawes, MD

## 2021-04-19 ENCOUNTER — Other Ambulatory Visit: Payer: Self-pay | Admitting: Internal Medicine

## 2021-04-19 DIAGNOSIS — E785 Hyperlipidemia, unspecified: Secondary | ICD-10-CM

## 2021-04-21 ENCOUNTER — Ambulatory Visit: Payer: Medicare Other | Admitting: Gastroenterology

## 2021-04-21 ENCOUNTER — Other Ambulatory Visit: Payer: Self-pay | Admitting: Internal Medicine

## 2021-04-21 ENCOUNTER — Telehealth: Payer: Self-pay

## 2021-04-21 ENCOUNTER — Encounter: Payer: Self-pay | Admitting: Gastroenterology

## 2021-04-21 VITALS — BP 134/74 | HR 64 | Ht 69.0 in | Wt 188.0 lb

## 2021-04-21 DIAGNOSIS — Z86010 Personal history of colonic polyps: Secondary | ICD-10-CM

## 2021-04-21 DIAGNOSIS — N183 Chronic kidney disease, stage 3 unspecified: Secondary | ICD-10-CM

## 2021-04-21 DIAGNOSIS — Z8673 Personal history of transient ischemic attack (TIA), and cerebral infarction without residual deficits: Secondary | ICD-10-CM

## 2021-04-21 DIAGNOSIS — I5032 Chronic diastolic (congestive) heart failure: Secondary | ICD-10-CM

## 2021-04-21 DIAGNOSIS — Z7902 Long term (current) use of antithrombotics/antiplatelets: Secondary | ICD-10-CM

## 2021-04-21 MED ORDER — PLENVU 140 G PO SOLR: 140.0000 g | ORAL | 0 refills | Status: DC

## 2021-04-21 NOTE — Telephone Encounter (Signed)
[facility] Pre-operative Risk Assessment     Request for surgical clearance:     Endoscopy Procedure  What type of surgery is being performed?     Colonoscopy   When is this surgery scheduled?     06-19-2021  What type of clearance is required ?   Pharmacy  Are there any medications that need to be held prior to surgery and how long? Yes, Plavix. 5 days before    Practice name and name of physician performing surgery?      [facility]  What is your office phone and fax number?      Phone- [phone]  [phone]  Anesthesia type (None, local, MAC, general) ?       MAC

## 2021-04-21 NOTE — Telephone Encounter (Signed)
Patient has not been seen by cardiology in over a year. He is due to see Dr. Angelena Form on 2/15, procedure is on 3/3, will defer cardiac clearance to MD at the time of follow up.

## 2021-04-21 NOTE — Patient Instructions (Signed)
If you are age 71 or older, your body mass index should be between 23-30. Your Body mass index is 27.76 kg/m. If this is out of the aforementioned range listed, please consider follow up with your Primary Care Provider.  If you are age 63 or younger, your body mass index should be between 19-25. Your Body mass index is 27.76 kg/m. If this is out of the aformentioned range listed, please consider follow up with your Primary Care Provider.   ________________________________________________________  The [facility] providers would like to encourage you to use [facility] to communicate with providers for non-urgent requests or questions.  Due to long hold times on the telephone, sending your provider a message by [facility] may be a faster and more efficient way to get a response.  Please allow 48 business hours for a response.  Please remember that this is for non-urgent requests.  _______________________________________________________  Larry Stone have been scheduled for a colonoscopy. Please follow written instructions given to you at your visit today.  Please pick up your prep supplies at the pharmacy within the next 1-3 days. If you use inhalers (even only as needed), please bring them with you on the day of your procedure.  It was a pleasure to see you today!  Thank you for trusting me with your gastrointestinal care!

## 2021-04-21 NOTE — Progress Notes (Signed)
[facility] Consult Note:  History: TKAI SERFASS 04/21/2021  Referring provider: Cassandria Anger, MD  Reason for consult/chief complaint: Colonoscopy (Hx of colon polyps. On Plavix)   Subjective  HPI:  Larry Stone is a very pleasant 71 year old man seen for history of colon polyps  Most recent colonoscopy June 2019 (for family history of colon cancer, prior colonoscopy 2014): 3 diminutive adenomatous polyps removed, 3-year recall recommended.  Bowel habits regular without rectal bleeding, denies chronic abdominal pain. 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  He also lately has some right-sided sciatic pain ROS:  Review of Systems  Constitutional:  Negative for appetite change and unexpected weight change.  HENT:  Negative for mouth sores and voice change.   Eyes:  Negative for pain and redness.  Respiratory:  Negative for cough and shortness of breath.   Cardiovascular:  Negative for chest pain and palpitations.  Genitourinary:  Negative for dysuria and hematuria.  Musculoskeletal:  Negative for arthralgias and myalgias.  Skin:  Negative for pallor and rash.  Neurological:  Positive for weakness and numbness. Negative for headaches.       See above  Hematological:  Negative for adenopathy.    Past Medical History: Past Medical History:  Diagnosis Date   Anxiety    CAD    a. acute inferior lateral wall infarction in September 2004 treated medically. b.  cath 06/17/16 showing mild nonobstructive CAD with 30-40% ostial LM (eccentric), elevated LV filling pressures and normal LV function.   Chronic diastolic CHF (congestive heart failure) (HCC)    CKD (chronic kidney disease), stage II    COPD (chronic obstructive pulmonary disease) (HCC)    Diabetes mellitus    Diverticulosis    DJD (degenerative joint disease)    GERD (gastroesophageal reflux disease)    HTN (hypertension)    Hyperlipidemia    Low back pain syndrome     Myocardial infarction ([place]) 2004   Obesity    OSA (obstructive sleep apnea)    RBBB    Recurrent aspiration bronchitis/pneumonia    Recurrent aspiration pneumonia ([place])    [name] 07/13/2016   Thrombocytopenia (HCC)    Tubular adenoma of colon 2014   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  LVEF 45 to 50% last echocardiogram September 2021  Past Surgical History: Past Surgical History:  Procedure Laterality Date   CARDIAC CATHETERIZATION     LEFT HEART CATH AND CORONARY ANGIOGRAPHY N/A 06/17/2016   Procedure: Left Heart Cath and Coronary Angiography;  Surgeon: Burnell Blanks, MD;  Location: [facility];  Service: Cardiovascular;  Laterality: N/A;   UVULOPALATOPHARYNGOPLASTY     surgery for OSA     Family History: Family History  Problem Relation Age of Onset   Stroke Mother    Heart disease Mother    Colon cancer Father    Hypertension Sister    Diabetes Sister    Colon cancer Paternal Uncle    Stomach cancer Neg Hx    Esophageal cancer Neg Hx    Pancreatic cancer Neg Hx     Social History: Social History   Socioeconomic History   Marital status: Married    Spouse name: Not on file   Number of children: 1   Years of education: Not on file   Highest education level: Not on file  Occupational History   Occupation: [employer]  Tobacco Use   Smoking status: Former  Packs/day: 1.00    Years: 20.00    Pack years: 20.00    Types: Cigarettes, Cigars    Quit date: 05/08/1998    Years since quitting: 22.9   Smokeless tobacco: Never   Tobacco comments:    quit in 2005  Vaping Use   Vaping Use: Never used  Substance and Sexual Activity   Alcohol use: Yes    Alcohol/week: 1.0 standard drink    Types: 1 Standard drinks or equivalent per week    Comment: social drinker   Drug use: No   Sexual activity: Yes  Other Topics Concern   Not on file  Social History Narrative   Lives with spouse only    Right Handed   Drinks <1 cup caffeine daily   Social Determinants of Health   Financial Resource Strain: Low Risk    Difficulty of Paying Living Expenses: Not hard at all  Food Insecurity: No Food Insecurity   Worried About Charity fundraiser in the Last Year: Never true   [place] in the Last Year: Never true  Transportation Needs: No Transportation Needs   Lack of Transportation (Medical): No   Lack of Transportation (Non-Medical): No  Physical Activity: Sufficiently Active   Days of Exercise per Week: 5 days   Minutes of Exercise per Session: 30 min  Stress: No Stress Concern Present   Feeling of Stress : Not at all  Social Connections: Unknown   Frequency of Communication with Friends and Family: More than three times a week   Frequency of Social Gatherings with Friends and Family: More than three times a week   Attends Religious Services: Patient refused   Active Member of Clubs or Organizations: Patient refused   Attends Archivist Meetings: Patient refused   Marital Status: Married    Allergies: Allergies  Allergen Reactions   Ace Inhibitors Swelling   Angiotensin Receptor Blockers Swelling   Aspirin Hives   Amlodipine     Leg swelling   Crestor [Rosuvastatin Calcium]     myalgias   Lipitor [Atorvastatin Calcium]     arthralgias   Codeine Nausea And Vomiting   Irbesartan Other (See Comments)    REACTION: allergic to ARBs w/ angioedema   Ramipril Other (See Comments)    REACTION: Allergic to ACE's w/ angioedema...    Outpatient Meds: Current Outpatient Medications  Medication Sig Dispense Refill   albuterol (PROVENTIL) (2.5 MG/3ML) 0.083% nebulizer solution Use one vial in the nebulizer every 4-6 hours if needed for cough or wheeze 225 mL 11   ALPRAZolam (XANAX) 0.5 MG tablet Take 1 tablet (0.5 mg total) by mouth 3 (three) times daily as needed. 270 tablet 1   ammonium lactate (LAC-HYDRIN) 12 % lotion APPLY TOPICALLY AS DIRECTED. 400 mL 3    budesonide-formoterol (SYMBICORT) 160-4.5 MCG/ACT inhaler INHALE 2 PUFFS INTO THE LUNGS 2 (TWO) TIMES DAILY. 3 Inhaler 0   buPROPion (WELLBUTRIN SR) 150 MG 12 hr tablet TAKE 1 TABLET BY MOUTH TWICE A DAY 180 tablet 3   carvedilol (COREG) 25 MG tablet Take 1 tablet (25 mg total) by mouth 2 (two) times daily with a meal. 180 tablet 3   Cholecalciferol (VITAMIN D3) 5000 UNITS CAPS Take one tablet by mouth daily     clopidogrel (PLAVIX) 75 MG tablet TAKE 1 TABLET BY MOUTH EVERY DAY 90 tablet 3   clotrimazole-betamethasone (LOTRISONE) cream APPLY AS DIRECTED 45 g 11   Coenzyme Q10 (COQ10) 100 MG CAPS Take as  directed 30 each 11   ezetimibe (ZETIA) 10 MG tablet Take 1 tablet (10 mg total) by mouth daily. Please make yearly appt with Dr. Angelena Form for December 2022 for future refills. Thank you 1st attempt 90 tablet 0   hydrALAZINE (APRESOLINE) 50 MG tablet Take 1 tablet (50 mg total) by mouth 4 (four) times daily. 360 tablet 3   meclizine (ANTIVERT) 25 MG tablet Take 0.5 tablets (12.5 mg total) by mouth 3 (three) times daily as needed for dizziness. 30 tablet 0   metFORMIN (GLUCOPHAGE) 500 MG tablet TAKE 1 TABLET BY MOUTH DAILY WITH BREAKFAST. OVERDUE FOR ANNUAL APPT W/ LABS FOR FUTURE REFILLS 30 tablet 11   montelukast (SINGULAIR) 10 MG tablet TAKE 1 TABLET BY MOUTH EVERYDAY AT BEDTIME 90 tablet 3   nitroGLYCERIN (NITROSTAT) 0.4 MG SL tablet Place 1 tablet (0.4 mg total) under the tongue every 5 (five) minutes as needed for chest pain. Up to 3 doses. Please make yearly appt with Dr. Angelena Form for December 2022 for future refills. Thank you 1st attempt 25 tablet 0   PEG-KCl-NaCl-NaSulf-Na Asc-C (PLENVU) 140 g SOLR Take 140 g by mouth as directed. Manufacturer's coupon Universal coupon code:BIN: P2366821; GROUP: [id number]; PCN: [id number]; ID: [id number]; PAY NO MORE $50 1 each 0   pravastatin (PRAVACHOL) 40 MG tablet TAKE 1 TABLET BY MOUTH EVERY DAY 90 tablet 3   tiZANidine (ZANAFLEX) 4 MG tablet TAKE 1 TABLET  BY MOUTH EVERY 6 HOURS AS NEEDED FOR MUSCLE SPASMS. 20 tablet 3   traMADol (ULTRAM) 50 MG tablet TAKE 1 TABLET BY MOUTH FOUR TIMES A DAY 60 tablet 1   vitamin C (ASCORBIC ACID) 500 MG tablet Take 500 mg by mouth daily.      No current facility-administered medications for this visit.      ___________________________________________________________________ Objective   Exam:  BP 134/74    Pulse 64    Ht 5\' 9"  (1.753 m)    Wt 188 lb (85.3 kg)    SpO2 98%    BMI 27.76 kg/m  Wt Readings from Last 3 Encounters:  04/21/21 188 lb (85.3 kg)  03/03/21 186 lb (84.4 kg)  02/17/21 184 lb 12.8 oz (83.8 kg)    General: Well-appearing, ambulatory, antalgic but steady gait, gets on exam table without difficulty. Eyes: sclera anicteric, no redness ENT: oral mucosa moist without lesions, no cervical or supraclavicular lymphadenopathy CV: RRR without murmur, S1/S2, no JVD, no peripheral edema Resp: clear to auscultation bilaterally, normal RR and effort noted GI: soft, no tenderness, with active bowel sounds. No guarding or palpable organomegaly noted. Skin; warm and dry, no rash or jaundice noted Neuro: awake, alert and oriented x 3. Normal gross motor function and fluent speech  Labs:   Assessment: Encounter Diagnoses  Name Primary?   Personal history of colonic polyps Yes   Long term (current) use of antithrombotics/antiplatelets    History of CVA (cerebrovascular accident)    Chronic renal insufficiency, stage 3 (moderate) (HCC)    Chronic diastolic CHF (congestive heart failure) ([place])     Surveillance colonoscopy, he appears well enough to proceed.  Somewhat increased risk due to underlying medical conditions and long-term use of antiplatelet agent.  Distant MI, no coronary stent, CVA with mild residual about 18 months ago, cardiology most likely will be agreeable to a 5-day hold of the Plavix.  K[place]illos was agreeable after discussion of procedure and risks.  The benefits and risks of  the planned procedure were described in detail with the patient  or (when appropriate) their health care proxy.  Risks were outlined as including, but not limited to, bleeding, infection, perforation, adverse medication reaction leading to cardiac or pulmonary decompensation, pancreatitis (if ERCP).  The limitation of incomplete mucosal visualization was also discussed.  No guarantees or warranties were given.   Thank you for the courtesy of this consult.  Please call me with any questions or concerns.  Nelida Meuse III  CC: Referring provider noted above

## 2021-04-22 ENCOUNTER — Ambulatory Visit: Payer: Medicare Other | Admitting: Internal Medicine

## 2021-04-22 ENCOUNTER — Encounter: Payer: Self-pay | Admitting: Internal Medicine

## 2021-04-22 ENCOUNTER — Other Ambulatory Visit: Payer: Self-pay

## 2021-04-22 VITALS — BP 138/72 | HR 56 | Temp 98.2°F | Ht 69.0 in | Wt 188.6 lb

## 2021-04-22 DIAGNOSIS — N183 Chronic kidney disease, stage 3 unspecified: Secondary | ICD-10-CM | POA: Diagnosis not present

## 2021-04-22 DIAGNOSIS — F419 Anxiety disorder, unspecified: Secondary | ICD-10-CM

## 2021-04-22 DIAGNOSIS — E785 Hyperlipidemia, unspecified: Secondary | ICD-10-CM

## 2021-04-22 DIAGNOSIS — Z8673 Personal history of transient ischemic attack (TIA), and cerebral infarction without residual deficits: Secondary | ICD-10-CM | POA: Diagnosis not present

## 2021-04-22 DIAGNOSIS — G8929 Other chronic pain: Secondary | ICD-10-CM

## 2021-04-22 DIAGNOSIS — M5440 Lumbago with sciatica, unspecified side: Secondary | ICD-10-CM

## 2021-04-22 DIAGNOSIS — N320 Bladder-neck obstruction: Secondary | ICD-10-CM

## 2021-04-22 LAB — URINALYSIS
Bilirubin Urine: NEGATIVE
Hgb urine dipstick: NEGATIVE
Ketones, ur: NEGATIVE
Leukocytes,Ua: NEGATIVE
Nitrite: NEGATIVE
Specific Gravity, Urine: 1.025 (ref 1.000–1.030)
Total Protein, Urine: NEGATIVE
Urine Glucose: NEGATIVE
Urobilinogen, UA: 1 (ref 0.0–1.0)
pH: 6.5 (ref 5.0–8.0)

## 2021-04-22 LAB — PSA: PSA: 0.41 ng/mL (ref 0.10–4.00)

## 2021-04-22 MED ORDER — TRAMADOL HCL 50 MG PO TABS: 50.0000 mg | ORAL_TABLET | Freq: Four times a day (QID) | ORAL | 1 refills | Status: DC | PRN

## 2021-04-22 NOTE — Assessment & Plan Note (Signed)
°  Cont on Wellbutrin Xanax prn  Potential benefits of a long term benzodiazepines  use as well as potential risks  and complications were explained to the patient and were aknowledged.

## 2021-04-22 NOTE — Assessment & Plan Note (Signed)
F/u w/Dr Bhandari Cont with Coreg, Hydralazine Hydrate well 

## 2021-04-22 NOTE — Assessment & Plan Note (Addendum)
R LBP Check UA Tramadol prn  Potential benefits of a long term opioids use as well as potential risks (i.e. addiction risk, apnea etc) and complications (i.e. Somnolence, constipation and others) were explained to the patient and were aknowledged.

## 2021-04-22 NOTE — Progress Notes (Signed)
Subjective:  Patient ID: Larry Stone, male    DOB: [birth year]  Age: 71 y.o. MRN: [id number]  CC: Back Pain (Pt states he is not sure if he has a bladder infection, but started out with (L) back pain. It was uncomfortable to urinate. Had some pressure, but he is a lot better know. Waiting to get PSA check as well) and Medication Refill (Want refill on Tramadol)   HPI  Larry Stone presents for R LBP, ?dysuria x 2 weeks F/u HTN, CRI  Outpatient Medications Prior to Visit  Medication Sig Dispense Refill   albuterol (PROVENTIL) (2.5 MG/3ML) 0.083% nebulizer solution Use one vial in the nebulizer every 4-6 hours if needed for cough or wheeze 225 mL 11   ALPRAZolam (XANAX) 0.5 MG tablet Take 1 tablet (0.5 mg total) by mouth 3 (three) times daily as needed. 270 tablet 1   ammonium lactate (LAC-HYDRIN) 12 % lotion APPLY TOPICALLY AS DIRECTED. 400 mL 3   budesonide-formoterol (SYMBICORT) 160-4.5 MCG/ACT inhaler INHALE 2 PUFFS INTO THE LUNGS 2 (TWO) TIMES DAILY. 3 Inhaler 0   buPROPion (WELLBUTRIN SR) 150 MG 12 hr tablet TAKE 1 TABLET BY MOUTH TWICE A DAY 180 tablet 3   carvedilol (COREG) 25 MG tablet Take 1 tablet (25 mg total) by mouth 2 (two) times daily with a meal. 180 tablet 3   Cholecalciferol (VITAMIN D3) 5000 UNITS CAPS Take one tablet by mouth daily     clopidogrel (PLAVIX) 75 MG tablet TAKE 1 TABLET BY MOUTH EVERY DAY 90 tablet 3   clotrimazole-betamethasone (LOTRISONE) cream APPLY AS DIRECTED 45 g 11   Coenzyme Q10 (COQ10) 100 MG CAPS Take as directed 30 each 11   ezetimibe (ZETIA) 10 MG tablet Take 1 tablet (10 mg total) by mouth daily. Please make yearly appt with Dr. Angelena Form for December 2022 for future refills. Thank you 1st attempt 90 tablet 0   hydrALAZINE (APRESOLINE) 50 MG tablet Take 1 tablet (50 mg total) by mouth 4 (four) times daily. 360 tablet 3   meclizine (ANTIVERT) 25 MG tablet Take 0.5 tablets (12.5 mg total) by mouth 3 (three) times daily as needed for dizziness.  30 tablet 0   metFORMIN (GLUCOPHAGE) 500 MG tablet TAKE 1 TABLET BY MOUTH DAILY WITH BREAKFAST. OVERDUE FOR ANNUAL APPT W/ LABS FOR FUTURE REFILLS 30 tablet 11   montelukast (SINGULAIR) 10 MG tablet TAKE 1 TABLET BY MOUTH EVERYDAY AT BEDTIME 90 tablet 3   nitroGLYCERIN (NITROSTAT) 0.4 MG SL tablet Place 1 tablet (0.4 mg total) under the tongue every 5 (five) minutes as needed for chest pain. Up to 3 doses. Please make yearly appt with Dr. Angelena Form for December 2022 for future refills. Thank you 1st attempt 25 tablet 0   PEG-KCl-NaCl-NaSulf-Na Asc-C (PLENVU) 140 g SOLR Take 140 g by mouth as directed. Manufacturer's coupon Universal coupon code:BIN: P2366821; GROUP: [id number]; PCN: CNRX; ID: [id number]; PAY NO MORE $50 1 each 0   pravastatin (PRAVACHOL) 40 MG tablet TAKE 1 TABLET BY MOUTH EVERY DAY 90 tablet 3   tiZANidine (ZANAFLEX) 4 MG tablet TAKE 1 TABLET BY MOUTH EVERY 6 HOURS AS NEEDED FOR MUSCLE SPASMS. 20 tablet 3   vitamin C (ASCORBIC ACID) 500 MG tablet Take 500 mg by mouth daily.      traMADol (ULTRAM) 50 MG tablet TAKE 1 TABLET BY MOUTH FOUR TIMES A DAY 60 tablet 1   No facility-administered medications prior to visit.    ROS: Review of Systems  Constitutional:  Negative for appetite change, fatigue and unexpected weight change.  HENT:  Negative for congestion, nosebleeds, sneezing, sore throat and trouble swallowing.   Eyes:  Negative for itching and visual disturbance.  Respiratory:  Negative for cough.   Cardiovascular:  Negative for chest pain, palpitations and leg swelling.  Gastrointestinal:  Negative for abdominal distention, blood in stool, diarrhea and nausea.  Genitourinary:  Positive for frequency and urgency. Negative for hematuria.  Musculoskeletal:  Positive for back pain. Negative for gait problem, joint swelling and neck pain.  Skin:  Negative for rash.  Neurological:  Negative for dizziness, tremors, speech difficulty and weakness.  Psychiatric/Behavioral:   Negative for agitation, dysphoric mood and sleep disturbance. The patient is not nervous/anxious.    Objective:  BP 138/72 (BP Location: Left Arm)    Pulse (!) 56    Temp 98.2 F (36.8 C) (Oral)    Ht 5\' 9"  (1.753 m)    Wt 188 lb 9.6 oz (85.5 kg)    SpO2 97%    BMI 27.85 kg/m   BP Readings from Last 3 Encounters:  04/22/21 138/72  04/21/21 134/74  03/03/21 115/64    Wt Readings from Last 3 Encounters:  04/22/21 188 lb 9.6 oz (85.5 kg)  04/21/21 188 lb (85.3 kg)  03/03/21 186 lb (84.4 kg)    Physical Exam Constitutional:      General: He is not in acute distress.    Appearance: He is well-developed.     Comments: NAD  Eyes:     Conjunctiva/sclera: Conjunctivae normal.     Pupils: Pupils are equal, round, and reactive to light.  Neck:     Thyroid: No thyromegaly.     Vascular: No JVD.  Cardiovascular:     Rate and Rhythm: Normal rate and regular rhythm.     Heart sounds: Normal heart sounds. No murmur heard.   No friction rub. No gallop.  Pulmonary:     Effort: Pulmonary effort is normal. No respiratory distress.     Breath sounds: Normal breath sounds. No wheezing or rales.  Chest:     Chest wall: No tenderness.  Abdominal:     General: Bowel sounds are normal. There is no distension.     Palpations: Abdomen is soft. There is no mass.     Tenderness: There is no abdominal tenderness. There is no guarding or rebound.  Musculoskeletal:        General: No tenderness. Normal range of motion.     Cervical back: Normal range of motion.  Lymphadenopathy:     Cervical: No cervical adenopathy.  Skin:    General: Skin is warm and dry.     Findings: No rash.  Neurological:     Mental Status: He is alert and oriented to person, place, and time.     Cranial Nerves: No cranial nerve deficit.     Motor: No abnormal muscle tone.     Coordination: Coordination normal.     Gait: Gait normal.     Deep Tendon Reflexes: Reflexes are normal and symmetric.  Psychiatric:         Behavior: Behavior normal.        Thought Content: Thought content normal.        Judgment: Judgment normal.    Lab Results  Component Value Date   WBC 9.9 12/26/2019   HGB 14.5 12/26/2019   HCT 43.0 12/26/2019   PLT 179 12/26/2019   GLUCOSE 87 02/17/2021   CHOL 144 12/26/2019  TRIG 91 12/26/2019   HDL 49 12/26/2019   LDLCALC 78 12/26/2019   ALT 16 12/26/2019   AST 19 12/26/2019   NA 140 02/17/2021   K 4.5 02/17/2021   CL 104 02/17/2021   CREATININE 1.34 02/17/2021   BUN 14 02/17/2021   CO2 31 02/17/2021   TSH 1.91 11/29/2018   PSA 0.5 12/26/2019   INR 1.1 12/04/2019   HGBA1C 5.8 02/17/2021    MR Lumbar Spine w/o contrast  Result Date: 12/06/2020 CLINICAL DATA:  Lumbar radiculopathy. EXAM: MRI LUMBAR SPINE WITHOUT CONTRAST TECHNIQUE: Multiplanar, multisequence MR imaging of the lumbar spine was performed. No intravenous contrast was administered. COMPARISON:  None. FINDINGS: Segmentation:  Standard. Alignment:  2 mm retrolisthesis of L2 on L3, L3 on L4 and L4 on L5. Vertebrae: No acute fracture, evidence of discitis, or aggressive bone lesion. Conus medullaris and cauda equina: Conus extends to the L1 level. Conus and cauda equina appear normal. Paraspinal and other soft tissues: No acute paraspinal abnormality. Disc levels: Disc spaces: Degenerative disease with disc height loss at T12-L1, L1-2, L2-3, L3-4 and L4-5. T12-L1: No significant disc bulge. No neural foraminal stenosis. No central canal stenosis. L1-L2: No disc protrusion. Mild left foraminal stenosis. No right foraminal stenosis. Mild bilateral facet arthropathy. L2-L3: Mild broad-based disc bulge. Moderate bilateral facet arthropathy. Mild spinal stenosis. Moderate left and mild right foraminal stenosis. L3-L4: Mild broad-based disc bulge. Mild bilateral facet arthropathy. Bilateral lateral recess stenosis. Mild right and moderate left foraminal stenosis. No central canal stenosis. L4-L5: Broad-based disc bulge. Moderate  bilateral facet arthropathy. Moderate right foraminal stenosis. No left foraminal stenosis. No spinal stenosis. L5-S1: Mild broad-based disc bulge. Moderate bilateral facet arthropathy, right worse than left. Reactive marrow edema involving the right facet joint. Right L5 pars interarticularis defect. Mild right foraminal stenosis. No left foraminal stenosis. IMPRESSION: 1. Lumbar spine spondylosis as described above. 2. No acute osseous injury of the lumbar spine. Electronically Signed   By: Kathreen Devoid M.D.   On: 12/06/2020 08:22   MR GDJME RIGHT WO CONTRAST  Result Date: 12/05/2020 CLINICAL DATA:  Posterior right thigh pain for several months. Stress fracture suspected. EXAM: MRI OF THE RIGHT FEMUR WITHOUT CONTRAST TECHNIQUE: Multiplanar, multisequence MR imaging of the right femur was performed. No intravenous contrast was administered. COMPARISON:  X-ray 05/31/2017 FINDINGS: Bones/Joint/Cartilage No acute fracture. No dislocation. No significant bone marrow edema. No periostitis. No suspicious marrow replacing bone lesion. Small bilateral knee joint effusions are evident. Evaluation for internal derangement is limited on large field-of-view imaging. Ligaments Intact. Muscles and Tendons Complete tear of the hamstring component of the adductor magnus tendon origin from the ischial tuberosity (series 8, images 11-13; series 7, image 19) with approximately 2.4 cm retraction with fluid-filled gap. Tendinosis with low-grade partial tearing of the semimembranosus tendon as well as the conjoint tendon of the long head biceps femoris and semitendinosus without a full-thickness or retracted component. Intramuscular edema within the proximal hamstring compartment. Origins of the contralateral left hamstring tendons appear intact on limited view. Soft tissues No well-defined hematoma. No mass identified within the soft tissues. Small right and trace left hydroceles. IMPRESSION: 1. Complete tear of the hamstring  component of the adductor magnus tendon origin from the ischial tuberosity with approximately 2.4 cm retraction with fluid-filled gap. 2. Tendinosis with low-grade partial tearing of the semimembranosus tendon as well as the conjoint tendon of the long head biceps femoris and semitendinosus without a full-thickness or retracted component. Electronically Signed   By:  Nicholas  Plundo D.O.   On: 12/05/2020 15:59    Assessment & Plan:   Problem List Items Addressed This Visit     Anxiety disorder     Cont on Wellbutrin Xanax prn  Potential benefits of a long term benzodiazepines  use as well as potential risks  and complications were explained to the patient and were aknowledged.      Chronic renal insufficiency, stage 3 (moderate) (HCC)    F/u w/Dr Carolin Sicks Cont with Coreg, Hydralazine Hydrate well      History of CVA (cerebrovascular accident)    No relapse      LOW BACK PAIN SYNDROME    R LBP Check UA Tramadol prn  Potential benefits of a long term opioids use as well as potential risks (i.e. addiction risk, apnea etc) and complications (i.e. Somnolence, constipation and others) were explained to the patient and were aknowledged.      Relevant Medications   traMADol (ULTRAM) 50 MG tablet   Other Relevant Orders   Urinalysis   Other Visit Diagnoses     Bladder neck obstruction    -  Primary   Relevant Orders   PSA         Meds ordered this encounter  Medications   traMADol (ULTRAM) 50 MG tablet    Sig: Take 1 tablet (50 mg total) by mouth every 6 (six) hours as needed.    Dispense:  120 tablet    Refill:  1    Not to exceed 5 additional fills before 09/05/2021      Follow-up: Return in about 3 months (around 07/21/2021) for a follow-up visit.  [name], MD

## 2021-04-22 NOTE — Assessment & Plan Note (Signed)
No relapse 

## 2021-05-05 ENCOUNTER — Ambulatory Visit: Payer: Medicare Other | Admitting: Internal Medicine

## 2021-05-06 ENCOUNTER — Ambulatory Visit: Payer: Medicare Other | Admitting: Internal Medicine

## 2021-05-27 ENCOUNTER — Other Ambulatory Visit: Payer: Self-pay | Admitting: Cardiovascular Disease

## 2021-05-29 ENCOUNTER — Telehealth: Payer: Self-pay

## 2021-05-29 NOTE — Telephone Encounter (Signed)
Pt is requesting prescription  for  cough and runny nose. COVID was Neg 05/28/2021.  Pt has tried allergy meds, nasal spray, corcidian(Pt thinks that's the spelling) no relief.  Please advise.  Pharmacy:  [id number] - [place], [place]  LOV 04/22/21  Pt CB [phone]

## 2021-05-31 MED ORDER — HYDROCODONE BIT-HOMATROP MBR 5-1.5 MG/5ML PO SOLN
5.0000 mL | Freq: Four times a day (QID) | ORAL | 0 refills | Status: AC | PRN
Start: 2021-05-31 — End: 2021-06-10

## 2021-05-31 NOTE — Telephone Encounter (Signed)
OK.  Cough syrup prescription with hydrocodone was emailed.  Start with 1/2 teaspoon to make sure there is no nausea. Office visit if not better. Thanks

## 2021-06-01 NOTE — Telephone Encounter (Signed)
I called pt to let him know to cough Rx was sent to Pharmacy notes and also to Start with 1/2 teaspoon to make sure there is no nausea. Also to give [place] call for Office visit if not better.  FYI

## 2021-06-02 NOTE — Progress Notes (Addendum)
Chief Complaint  Patient presents with   Follow-up    CAD    History of Present Illness: 71 yo male with history of CAD, HLD, HTN, OSA and chronic diastolic CHF here today for cardiac follow up. 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 Stress myoview in June 2015 with inferolateral scar and no ischemia. When I saw him in February 2018 he was c/o dyspnea and fatigue. Cardiac cath on 06/17/16 with 40% left main stenosis, mild disease in the diagonal and proximal circumflex. He has history of myalgias with Lipitor and Crestor so he has been on Pravastatin. He is allergic to Ace-inhibitors and ARBs and has has lower extremity edema with Norvasc. He was started on hydralazine in May of 2020. We have discussed Repatha but he has not wished to start due to cost. He had a stroke mid August 2021. He left the ED waiting area before being seen by a physician. EKG with sinus that night. Dr. [name] saw him in the office and arrange a head CT and brain MRI which confirmed the stroke. Carotid artery dopplers August 2021 with mild bilateral carotid artery disease. Echo September 2021 with LVEF=45-50%, mild LVH, no valve disease. Cardiac monitor October 2021 with sinus, PVCs.   He is here today for follow up. The patient denies any chest pain, dyspnea, palpitations, lower extremity edema, orthopnea, PND, dizziness, near syncope or syncope. He has right hip pain.   Primary Care Physician: Cassandria Anger, MD  Past Medical History:  Diagnosis Date   Anxiety    CAD    a. acute inferior lateral wall infarction in September 2004 treated medically. b.  cath 06/17/16 showing mild nonobstructive CAD with 30-40% ostial LM (eccentric), elevated LV filling pressures and normal LV function.   Chronic diastolic CHF (congestive heart failure) (HCC)    CKD (chronic kidney disease), stage II    COPD (chronic obstructive pulmonary  disease) (HCC)    Diabetes mellitus    Diverticulosis    DJD (degenerative joint disease)    GERD (gastroesophageal reflux disease)    HTN (hypertension)    Hyperlipidemia    Low back pain syndrome    Myocardial infarction ([place]) 2004   Obesity    OSA (obstructive sleep apnea)    RBBB    Recurrent aspiration bronchitis/pneumonia    Recurrent aspiration pneumonia ([place])    [name] 07/13/2016   Thrombocytopenia (HCC)    Tubular adenoma of colon 2014    Past Surgical History:  Procedure Laterality Date   CARDIAC CATHETERIZATION     LEFT HEART CATH AND CORONARY ANGIOGRAPHY N/A 06/17/2016   Procedure: Left Heart Cath and Coronary Angiography;  Surgeon: Burnell Blanks, MD;  Location: [facility];  Service: Cardiovascular;  Laterality: N/A;   UVULOPALATOPHARYNGOPLASTY     surgery for OSA    Current Outpatient Medications  Medication Sig Dispense Refill   albuterol (PROVENTIL) (2.5 MG/3ML) 0.083% nebulizer solution Use one vial in the nebulizer every 4-6 hours if needed for cough or wheeze 225 mL 11   ALPRAZolam (XANAX) 0.5 MG tablet Take 1 tablet (0.5 mg total) by mouth 3 (three) times daily as needed. 270 tablet 1   ammonium lactate (LAC-HYDRIN) 12 % lotion APPLY TOPICALLY AS DIRECTED. 400 mL 3   budesonide-formoterol (SYMBICORT) 160-4.5 MCG/ACT inhaler INHALE 2 PUFFS INTO THE LUNGS 2 (TWO) TIMES DAILY. 3 Inhaler 0   buPROPion (  WELLBUTRIN SR) 150 MG 12 hr tablet TAKE 1 TABLET BY MOUTH TWICE A DAY 180 tablet 3   carvedilol (COREG) 25 MG tablet Take 1 tablet (25 mg total) by mouth 2 (two) times daily with a meal. 180 tablet 3   Cholecalciferol (VITAMIN D3) 5000 UNITS CAPS Take one tablet by mouth daily     clopidogrel (PLAVIX) 75 MG tablet TAKE 1 TABLET BY MOUTH EVERY DAY 90 tablet 3   clotrimazole-betamethasone (LOTRISONE) cream APPLY AS DIRECTED 45 g 11   Coenzyme Q10 (COQ10) 100 MG CAPS Take as directed 30 each 11   hydrALAZINE (APRESOLINE) 50 MG tablet Take 1 tablet (50  mg total) by mouth 4 (four) times daily. 360 tablet 3   HYDROcodone bit-homatropine (HYCODAN) 5-1.5 MG/5ML syrup Take 5 mLs by mouth every 6 (six) hours as needed for up to 10 days for cough. 120 mL 0   meclizine (ANTIVERT) 25 MG tablet Take 0.5 tablets (12.5 mg total) by mouth 3 (three) times daily as needed for dizziness. 30 tablet 0   metFORMIN (GLUCOPHAGE) 500 MG tablet TAKE 1 TABLET BY MOUTH DAILY WITH BREAKFAST. OVERDUE FOR ANNUAL APPT W/ LABS FOR FUTURE REFILLS 30 tablet 11   montelukast (SINGULAIR) 10 MG tablet TAKE 1 TABLET BY MOUTH EVERYDAY AT BEDTIME 90 tablet 3   nitroGLYCERIN (NITROSTAT) 0.4 MG SL tablet Place 1 tablet (0.4 mg total) under the tongue every 5 (five) minutes as needed for chest pain. Up to 3 doses. Please make yearly appt with Dr. Angelena Form for December 2022 for future refills. Thank you 1st attempt 25 tablet 0   PEG-KCl-NaCl-NaSulf-Na Asc-C (PLENVU) 140 g SOLR Take 140 g by mouth as directed. Manufacturer's coupon Universal coupon code:BIN: P2366821; GROUP: [id number]; PCN: [id number]; ID: [id number]; PAY NO MORE $50 1 each 0   pravastatin (PRAVACHOL) 40 MG tablet TAKE 1 TABLET BY MOUTH EVERY DAY 90 tablet 3   tiZANidine (ZANAFLEX) 4 MG tablet TAKE 1 TABLET BY MOUTH EVERY 6 HOURS AS NEEDED FOR MUSCLE SPASMS. 20 tablet 3   traMADol (ULTRAM) 50 MG tablet Take 1 tablet (50 mg total) by mouth every 6 (six) hours as needed. 120 tablet 1   vitamin C (ASCORBIC ACID) 500 MG tablet Take 500 mg by mouth daily.      ezetimibe (ZETIA) 10 MG tablet Take 1 tablet (10 mg total) by mouth daily. 90 tablet 3   No current facility-administered medications for this visit.    Allergies  Allergen Reactions   Ace Inhibitors Swelling   Angiotensin Receptor Blockers Swelling   Aspirin Hives   Amlodipine     Leg swelling   Crestor [Rosuvastatin Calcium]     myalgias   Lipitor [Atorvastatin Calcium]     arthralgias   Codeine Nausea And Vomiting   Irbesartan Other (See Comments)    REACTION:  allergic to ARBs w/ angioedema   Ramipril Other (See Comments)    REACTION: Allergic to ACE's w/ angioedema...    Social History   Socioeconomic History   Marital status: Married    Spouse name: Not on file   Number of children: 1   Years of education: Not on file   Highest education level: Not on file  Occupational History   Occupation: [employer]  Tobacco Use   Smoking status: Former    Packs/day: 1.00    Years: 20.00    Pack years: 20.00    Types: Cigarettes, Cigars    Quit date: 05/08/1998    Years  since quitting: 23.0   Smokeless tobacco: Never   Tobacco comments:    quit in 2005  Vaping Use   Vaping Use: Never used  Substance and Sexual Activity   Alcohol use: Yes    Alcohol/week: 1.0 standard drink    Types: 1 Standard drinks or equivalent per week    Comment: social drinker   Drug use: No   Sexual activity: Yes  Other Topics Concern   Not on file  Social History Narrative   Lives with spouse only   Right Handed   Drinks <1 cup caffeine daily   Social Determinants of Health   Financial Resource Strain: Low Risk    Difficulty of Paying Living Expenses: Not hard at all  Food Insecurity: No Food Insecurity   Worried About Charity fundraiser in the Last Year: Never true   [place] in the Last Year: Never true  Transportation Needs: No Transportation Needs   Lack of Transportation (Medical): No   Lack of Transportation (Non-Medical): No  Physical Activity: Sufficiently Active   Days of Exercise per Week: 5 days   Minutes of Exercise per Session: 30 min  Stress: No Stress Concern Present   Feeling of Stress : Not at all  Social Connections: Unknown   Frequency of Communication with Friends and Family: More than three times a week   Frequency of Social Gatherings with Friends and Family: More than three times a week   Attends Religious Services: Patient refused   Marine scientist or Organizations: Patient refused    Attends Music therapist: Patient refused   Marital Status: Married  Human resources officer Violence: Not At Risk   Fear of Current or Ex-Partner: No   Emotionally Abused: No   Physically Abused: No   Sexually Abused: No    Family History  Problem Relation Age of Onset   Stroke Mother    Heart disease Mother    Colon cancer Father    Hypertension Sister    Diabetes Sister    Colon cancer Paternal Uncle    Stomach cancer Neg Hx    Esophageal cancer Neg Hx    Pancreatic cancer Neg Hx     Review of Systems:  As stated in the HPI and otherwise negative.   BP 122/70    Pulse (!) 59    Ht 5\' 9"  (1.753 m)    Wt 188 lb 9.6 oz (85.5 kg)    SpO2 99%    BMI 27.85 kg/m   Physical Examination:  General: Well developed, well nourished, NAD  HEENT: OP clear, mucus membranes moist  SKIN: warm, dry. No rashes. Neuro: No focal deficits  Musculoskeletal: Muscle strength 5/5 all ext  Psychiatric: Mood and affect normal  Neck: No JVD, no carotid bruits, no thyromegaly, no lymphadenopathy.  Lungs:Clear bilaterally, no wheezes, rhonci, crackles Cardiovascular: Regular rate and rhythm. No murmurs, gallops or rubs. Abdomen:Soft. Bowel sounds present. Non-tender.  Extremities: No lower extremity edema. Pulses are 2 + in the bilateral DP/PT.  Echo September 2021:   1. Left ventricular ejection fraction, by estimation, is 45 to 50%. The  left ventricle has mildly decreased function. The left ventricle  demonstrates regional wall motion abnormalities (see scoring  diagram/findings for description). There is mild  concentric left ventricular hypertrophy. Left ventricular diastolic  parameters are consistent with Grade I diastolic dysfunction (impaired  relaxation). There is moderate hypokinesis of the left ventricular,  basal-mid inferolateral wall.   2. Right  ventricular systolic function is normal. The right ventricular  size is normal.   3. The mitral valve is normal in structure. No  evidence of mitral valve  regurgitation.   4. The aortic valve is tricuspid. Aortic valve regurgitation is not  visualized. No aortic stenosis is present.  Cardiac cath 06/17/16: Ost Cx to Mid Cx lesion, 20 %stenosed. Ost LM lesion, 40 %stenosed. 1st Diag lesion, 40 %stenosed. The left ventricular systolic function is normal. LV end diastolic pressure is normal. The left ventricular ejection fraction is greater than 65% by visual estimate. There is no mitral valve regurgitation.     EKG:  EKG is not ordered today. The ekg ordered today demonstrates  sinus, RBBB  Recent Labs: 02/17/2021: BUN 14; Creatinine, Ser 1.34; Potassium 4.5; Sodium 140   Lipid Panel    Component Value Date/Time   CHOL 144 12/26/2019 1040   TRIG 91 12/26/2019 1040   HDL 49 12/26/2019 1040   CHOLHDL 2.9 12/26/2019 1040   VLDL 12.6 06/19/2019 1253   LDLCALC 78 12/26/2019 1040     Wt Readings from Last 3 Encounters:  06/03/21 188 lb 9.6 oz (85.5 kg)  04/22/21 188 lb 9.6 oz (85.5 kg)  04/21/21 188 lb (85.3 kg)     Other studies Reviewed: Additional studies/ records that were reviewed today include: . Review of the above records demonstrates:   Assessment and Plan:  1. CAD without angina: No chest pain. Cardiac cath March 2018 with mild to moderate non-obstructive CAD. Will continue Plavix, statin and beta blocker. He is on Plavix because of his stroke in 2021.   2. HYPERTENSION:  BP is well controlled. No changes  3. HYPERLIPIDEMIA: LDL near goal in September 2021. Continue statin and Zetia. Lipids and LFTS now  4. Chronic diastolic CHF: No volume overload on exam. Weight is stable. Lasix as needed.    5. Stroke: Followed in neurology. Cardiac monitor with sinus and PVCs. Loop recorder not recommended by Neuro team.   6. Pre-operative cardiovascular risk assessment: OK to proceed with colonoscopy. OK to hold Plavix 5 days before procedure.   Current medicines are reviewed at length with the  patient today.  The patient does not have concerns regarding medicines.  The following changes have been made:  no change  Labs/ tests ordered today include:   Orders Placed This Encounter  Procedures   Hepatic function panel   Lipid panel   EKG 12-Lead    Disposition:   F/U with me 12 months  Signed, Lauree Chandler, MD 06/03/2021 9:20 AM    [facility] [address], [place], [place]  [address] Phone: [phone]; Fax: [phone]

## 2021-06-03 ENCOUNTER — Other Ambulatory Visit: Payer: Self-pay

## 2021-06-03 ENCOUNTER — Ambulatory Visit: Payer: Medicare Other | Admitting: Cardiovascular Disease

## 2021-06-03 ENCOUNTER — Encounter: Payer: Self-pay | Admitting: Cardiovascular Disease

## 2021-06-03 VITALS — BP 122/70 | HR 59 | Ht 69.0 in | Wt 188.6 lb

## 2021-06-03 DIAGNOSIS — E785 Hyperlipidemia, unspecified: Secondary | ICD-10-CM

## 2021-06-03 DIAGNOSIS — I5032 Chronic diastolic (congestive) heart failure: Secondary | ICD-10-CM

## 2021-06-03 DIAGNOSIS — I2510 Atherosclerotic heart disease of native coronary artery without angina pectoris: Secondary | ICD-10-CM

## 2021-06-03 DIAGNOSIS — I10 Essential (primary) hypertension: Secondary | ICD-10-CM | POA: Diagnosis not present

## 2021-06-03 LAB — HEPATIC FUNCTION PANEL
ALT: 22 IU/L (ref 0–44)
AST: 20 IU/L (ref 0–40)
Albumin: 4 g/dL (ref 3.7–4.7)
Alkaline Phosphatase: 85 IU/L (ref 44–121)
Bilirubin Total: 0.4 mg/dL (ref 0.0–1.2)
Bilirubin, Direct: 0.13 mg/dL (ref 0.00–0.40)
Total Protein: 6 g/dL (ref 6.0–8.5)

## 2021-06-03 LAB — LIPID PANEL
Chol/HDL Ratio: 2.4 ratio (ref 0.0–5.0)
Cholesterol, Total: 144 mg/dL (ref 100–199)
HDL: 60 mg/dL (ref >39)
LDL Chol Calc (NIH): 71 mg/dL (ref 0–99)
Triglycerides: 64 mg/dL (ref 0–149)
VLDL Cholesterol Cal: 13 mg/dL (ref 5–40)

## 2021-06-03 MED ORDER — EZETIMIBE 10 MG PO TABS: 10.0000 mg | ORAL_TABLET | Freq: Every day | ORAL | 3 refills | Status: DC

## 2021-06-03 NOTE — Patient Instructions (Signed)
Medication Instructions:  No changes today. *If you need a refill on your cardiac medications before your next appointment, please call your pharmacy*   Lab Work: Today:  lipids, liver function test   Testing/Procedures: None ordered   Follow-Up: At [facility], you and your health needs are our priority.  As part of our continuing mission to provide you with exceptional heart care, we have created designated Provider Care Teams.  These Care Teams include your primary Cardiologist (physician) and Advanced Practice Providers (APPs -  Physician Assistants and Nurse Practitioners) who all work together to provide you with the care you need, when you need it.   Your next appointment:   12 month(s)  The format for your next appointment:   In Person  Provider:   Lauree Chandler, MD     Other Instructions

## 2021-06-05 NOTE — Telephone Encounter (Signed)
Left message to return call 

## 2021-06-05 NOTE — Telephone Encounter (Signed)
Communication from [place] is as follows Pre-operative cardiovascular risk assessment: OK to proceed with colonoscopy. OK to hold Plavix 5 days before procedure.

## 2021-06-12 ENCOUNTER — Encounter: Payer: Self-pay | Admitting: Gastroenterology

## 2021-06-14 ENCOUNTER — Other Ambulatory Visit: Payer: Self-pay | Admitting: Cardiovascular Disease

## 2021-06-14 ENCOUNTER — Other Ambulatory Visit: Payer: Self-pay | Admitting: Physician Assistant

## 2021-06-16 ENCOUNTER — Telehealth: Payer: Self-pay | Admitting: Gastroenterology

## 2021-06-16 NOTE — Telephone Encounter (Signed)
Patient rescheduled procedure from 06/19/21 to 07/30/21 requesting new prep instructions FYI also had a typo on previous instructions with the date.

## 2021-06-16 NOTE — Telephone Encounter (Signed)
New prep instructions have been mailed to the home address on file

## 2021-06-17 ENCOUNTER — Other Ambulatory Visit: Payer: Self-pay | Admitting: Internal Medicine

## 2021-06-18 ENCOUNTER — Ambulatory Visit: Payer: Medicare Other | Admitting: Internal Medicine

## 2021-06-19 ENCOUNTER — Encounter: Payer: Medicare Other | Admitting: Gastroenterology

## 2021-06-24 ENCOUNTER — Other Ambulatory Visit: Payer: Self-pay

## 2021-06-24 ENCOUNTER — Ambulatory Visit: Payer: Medicare Other | Admitting: Internal Medicine

## 2021-06-24 ENCOUNTER — Encounter: Payer: Self-pay | Admitting: Internal Medicine

## 2021-06-24 DIAGNOSIS — S76311S Strain of muscle, fascia and tendon of the posterior muscle group at thigh level, right thigh, sequela: Secondary | ICD-10-CM

## 2021-06-24 DIAGNOSIS — Z8673 Personal history of transient ischemic attack (TIA), and cerebral infarction without residual deficits: Secondary | ICD-10-CM

## 2021-06-24 DIAGNOSIS — G8929 Other chronic pain: Secondary | ICD-10-CM

## 2021-06-24 DIAGNOSIS — M25551 Pain in right hip: Secondary | ICD-10-CM | POA: Diagnosis not present

## 2021-06-24 DIAGNOSIS — S76319A Strain of muscle, fascia and tendon of the posterior muscle group at thigh level, unspecified thigh, initial encounter: Secondary | ICD-10-CM | POA: Insufficient documentation

## 2021-06-24 DIAGNOSIS — N183 Chronic kidney disease, stage 3 unspecified: Secondary | ICD-10-CM | POA: Diagnosis not present

## 2021-06-24 MED ORDER — PREDNISONE 5 MG PO TABS: 5.0000 mg | ORAL_TABLET | Freq: Every day | ORAL | 1 refills | Status: DC

## 2021-06-24 NOTE — Patient Instructions (Addendum)
TENS unit ?Rice sock heating pad ?Massage pad ? ?

## 2021-06-24 NOTE — Progress Notes (Signed)
Subjective:  Patient ID: Larry Stone, male    DOB: 1951/04/08  Age: 71 y.o. MRN: [id number]  CC: Follow-up   HPI Larry Stone presents for R leg pain - pulled hamstring muscle - on Tramadol 3/d - seeing Dr Erlinda Hong. MRI shows rupture of the proximal hamstring of the abductor magnus component. Pain is 9/10 and 3-5/10 on Tramadol.  F/u CVA, anxiety,   Outpatient Medications Prior to Visit  Medication Sig Dispense Refill   albuterol (PROVENTIL) (2.5 MG/3ML) 0.083% nebulizer solution Use one vial in the nebulizer every 4-6 hours if needed for cough or wheeze 225 mL 11   ALPRAZolam (XANAX) 0.5 MG tablet TAKE 1 TABLET BY MOUTH 3 TIMES DAILY AS NEEDED. 270 tablet 0   ammonium lactate (LAC-HYDRIN) 12 % lotion APPLY TOPICALLY AS DIRECTED. 400 mL 3   budesonide-formoterol (SYMBICORT) 160-4.5 MCG/ACT inhaler INHALE 2 PUFFS INTO THE LUNGS 2 (TWO) TIMES DAILY. 3 Inhaler 0   buPROPion (WELLBUTRIN SR) 150 MG 12 hr tablet TAKE 1 TABLET BY MOUTH TWICE A DAY 180 tablet 3   carvedilol (COREG) 25 MG tablet Take 1 tablet (25 mg total) by mouth 2 (two) times daily with a meal. 180 tablet 3   Cholecalciferol (VITAMIN D3) 5000 UNITS CAPS Take one tablet by mouth daily     clopidogrel (PLAVIX) 75 MG tablet TAKE 1 TABLET BY MOUTH EVERY DAY 90 tablet 3   clotrimazole-betamethasone (LOTRISONE) cream APPLY AS DIRECTED 45 g 11   Coenzyme Q10 (COQ10) 100 MG CAPS Take as directed 30 each 11   ezetimibe (ZETIA) 10 MG tablet Take 1 tablet (10 mg total) by mouth daily. 90 tablet 3   hydrALAZINE (APRESOLINE) 50 MG tablet TAKE 1 TABLET BY MOUTH 4 TIMES DAILY. 360 tablet 3   meclizine (ANTIVERT) 25 MG tablet Take 0.5 tablets (12.5 mg total) by mouth 3 (three) times daily as needed for dizziness. 30 tablet 0   metFORMIN (GLUCOPHAGE) 500 MG tablet TAKE 1 TABLET BY MOUTH DAILY WITH BREAKFAST. OVERDUE FOR ANNUAL APPT W/ LABS FOR FUTURE REFILLS 30 tablet 11   montelukast (SINGULAIR) 10 MG tablet TAKE 1 TABLET BY MOUTH EVERYDAY AT  BEDTIME 90 tablet 3   nitroGLYCERIN (NITROSTAT) 0.4 MG SL tablet Place 1 tablet (0.4 mg total) under the tongue every 5 (five) minutes as needed for chest pain. Up to 3 doses. Please make yearly appt with Dr. Angelena Form for December 2022 for future refills. Thank you 1st attempt 25 tablet 0   PEG-KCl-NaCl-NaSulf-Na Asc-C (PLENVU) 140 g SOLR Take 140 g by mouth as directed. Manufacturer's coupon Universal coupon code:BIN: [id number]; GROUP: [id number]; PCN: [id number]; ID: [id number]; PAY NO MORE $50 1 each 0   pravastatin (PRAVACHOL) 40 MG tablet TAKE 1 TABLET BY MOUTH EVERY DAY 90 tablet 3   tiZANidine (ZANAFLEX) 4 MG tablet TAKE 1 TABLET BY MOUTH EVERY 6 HOURS AS NEEDED FOR MUSCLE SPASMS. 20 tablet 3   traMADol (ULTRAM) 50 MG tablet Take 1 tablet (50 mg total) by mouth every 6 (six) hours as needed. 120 tablet 1   vitamin C (ASCORBIC ACID) 500 MG tablet Take 500 mg by mouth daily.      No facility-administered medications prior to visit.    ROS: Review of Systems  Constitutional:  Negative for appetite change, fatigue and unexpected weight change.  HENT:  Negative for congestion, nosebleeds, sneezing, sore throat and trouble swallowing.   Eyes:  Negative for itching and visual disturbance.  Respiratory:  Negative for cough.  Cardiovascular:  Negative for chest pain, palpitations and leg swelling.  Gastrointestinal:  Negative for abdominal distention, blood in stool, diarrhea and nausea.  Genitourinary:  Negative for frequency and hematuria.  Musculoskeletal:  Positive for arthralgias and gait problem. Negative for back pain, joint swelling and neck pain.  Skin:  Negative for rash.  Neurological:  Negative for dizziness, tremors, speech difficulty and weakness.  Psychiatric/Behavioral:  Negative for agitation, dysphoric mood and sleep disturbance. The patient is not nervous/anxious.    Objective:  BP 130/68 (BP Location: Left Arm, Patient Position: Sitting, Cuff Size: Large)    Pulse 60    Temp 97.8  F (36.6 C) (Oral)    Ht '5\' 9"'$  (1.753 m)    Wt 189 lb (85.7 kg)    SpO2 98%    BMI 27.91 kg/m   BP Readings from Last 3 Encounters:  06/24/21 130/68  06/03/21 122/70  04/22/21 138/72    Wt Readings from Last 3 Encounters:  06/24/21 189 lb (85.7 kg)  06/03/21 188 lb 9.6 oz (85.5 kg)  04/22/21 188 lb 9.6 oz (85.5 kg)    Physical Exam Constitutional:      General: He is not in acute distress.    Appearance: He is well-developed. He is obese.     Comments: NAD  Eyes:     Conjunctiva/sclera: Conjunctivae normal.     Pupils: Pupils are equal, round, and reactive to light.  Neck:     Thyroid: No thyromegaly.     Vascular: No JVD.  Cardiovascular:     Rate and Rhythm: Normal rate and regular rhythm.     Heart sounds: Normal heart sounds. No murmur heard.   No friction rub. No gallop.  Pulmonary:     Effort: Pulmonary effort is normal. No respiratory distress.     Breath sounds: Normal breath sounds. No wheezing or rales.  Chest:     Chest wall: No tenderness.  Abdominal:     General: Bowel sounds are normal. There is no distension.     Palpations: Abdomen is soft. There is no mass.     Tenderness: There is no abdominal tenderness. There is no guarding or rebound.  Musculoskeletal:        General: No tenderness. Normal range of motion.     Cervical back: Normal range of motion.  Lymphadenopathy:     Cervical: No cervical adenopathy.  Skin:    General: Skin is warm and dry.     Findings: No rash.  Neurological:     Mental Status: He is alert and oriented to person, place, and time.     Cranial Nerves: No cranial nerve deficit.     Motor: No abnormal muscle tone.     Coordination: Coordination normal.     Gait: Gait normal.     Deep Tendon Reflexes: Reflexes are normal and symmetric.  Psychiatric:        Behavior: Behavior normal.        Thought Content: Thought content normal.        Judgment: Judgment normal.    Lab Results  Component Value Date   WBC 9.9  12/26/2019   HGB 14.5 12/26/2019   HCT 43.0 12/26/2019   PLT 179 12/26/2019   GLUCOSE 87 02/17/2021   CHOL 144 06/03/2021   TRIG 64 06/03/2021   HDL 60 06/03/2021   LDLCALC 71 06/03/2021   ALT 22 06/03/2021   AST 20 06/03/2021   NA 140 02/17/2021   K 4.5 02/17/2021  CL 104 02/17/2021   CREATININE 1.34 02/17/2021   BUN 14 02/17/2021   CO2 31 02/17/2021   TSH 1.91 11/29/2018   PSA 0.41 04/22/2021   INR 1.1 12/04/2019   HGBA1C 5.8 02/17/2021    MR Lumbar Spine w/o contrast  Result Date: 12/06/2020 CLINICAL DATA:  Lumbar radiculopathy. EXAM: MRI LUMBAR SPINE WITHOUT CONTRAST TECHNIQUE: Multiplanar, multisequence MR imaging of the lumbar spine was performed. No intravenous contrast was administered. COMPARISON:  None. FINDINGS: Segmentation:  Standard. Alignment:  2 mm retrolisthesis of L2 on L3, L3 on L4 and L4 on L5. Vertebrae: No acute fracture, evidence of discitis, or aggressive bone lesion. Conus medullaris and cauda equina: Conus extends to the L1 level. Conus and cauda equina appear normal. Paraspinal and other soft tissues: No acute paraspinal abnormality. Disc levels: Disc spaces: Degenerative disease with disc height loss at T12-L1, L1-2, L2-3, L3-4 and L4-5. T12-L1: No significant disc bulge. No neural foraminal stenosis. No central canal stenosis. L1-L2: No disc protrusion. Mild left foraminal stenosis. No right foraminal stenosis. Mild bilateral facet arthropathy. L2-L3: Mild broad-based disc bulge. Moderate bilateral facet arthropathy. Mild spinal stenosis. Moderate left and mild right foraminal stenosis. L3-L4: Mild broad-based disc bulge. Mild bilateral facet arthropathy. Bilateral lateral recess stenosis. Mild right and moderate left foraminal stenosis. No central canal stenosis. L4-L5: Broad-based disc bulge. Moderate bilateral facet arthropathy. Moderate right foraminal stenosis. No left foraminal stenosis. No spinal stenosis. L5-S1: Mild broad-based disc bulge. Moderate  bilateral facet arthropathy, right worse than left. Reactive marrow edema involving the right facet joint. Right L5 pars interarticularis defect. Mild right foraminal stenosis. No left foraminal stenosis. IMPRESSION: 1. Lumbar spine spondylosis as described above. 2. No acute osseous injury of the lumbar spine. Electronically Signed   By: Kathreen Devoid M.D.   On: 12/06/2020 08:22   MR XNTZG RIGHT WO CONTRAST  Result Date: 12/05/2020 CLINICAL DATA:  Posterior right thigh pain for several months. Stress fracture suspected. EXAM: MRI OF THE RIGHT FEMUR WITHOUT CONTRAST TECHNIQUE: Multiplanar, multisequence MR imaging of the right femur was performed. No intravenous contrast was administered. COMPARISON:  X-ray 05/31/2017 FINDINGS: Bones/Joint/Cartilage No acute fracture. No dislocation. No significant bone marrow edema. No periostitis. No suspicious marrow replacing bone lesion. Small bilateral knee joint effusions are evident. Evaluation for internal derangement is limited on large field-of-view imaging. Ligaments Intact. Muscles and Tendons Complete tear of the hamstring component of the adductor magnus tendon origin from the ischial tuberosity (series 8, images 11-13; series 7, image 19) with approximately 2.4 cm retraction with fluid-filled gap. Tendinosis with low-grade partial tearing of the semimembranosus tendon as well as the conjoint tendon of the long head biceps femoris and semitendinosus without a full-thickness or retracted component. Intramuscular edema within the proximal hamstring compartment. Origins of the contralateral left hamstring tendons appear intact on limited view. Soft tissues No well-defined hematoma. No mass identified within the soft tissues. Small right and trace left hydroceles. IMPRESSION: 1. Complete tear of the hamstring component of the adductor magnus tendon origin from the ischial tuberosity with approximately 2.4 cm retraction with fluid-filled gap. 2. Tendinosis with  low-grade partial tearing of the semimembranosus tendon as well as the conjoint tendon of the long head biceps femoris and semitendinosus without a full-thickness or retracted component. Electronically Signed   By: Davina Poke D.O.   On: 12/05/2020 15:59    Assessment & Plan:   Problem List Items Addressed This Visit     Chronic renal insufficiency, stage 3 (moderate) (HCC)  Cont with Coreg, Hydralazine Hydrate well      Hamstring tendon rupture    R leg pain - pulled hamstring muscle - on Tramadol 3/d - seeing Dr Erlinda Hong. MRI shows rupture of the proximal hamstring of the abductor magnus component. Pain is 9/10 and 3-5/10 on Tramadol. Try TENS unit Rice sock heating pad Massage pad Will try low dose Prednisone 5 mg/d      Hip pain, chronic, right    R leg pain - pulled hamstring muscle - on Tramadol 3/d - seeing Dr Erlinda Hong. MRI shows rupture of the proximal hamstring of the abductor magnus component. Pain is 9/10 and 3-5/10 on Tramadol.      Relevant Medications   predniSONE (DELTASONE) 5 MG tablet   History of CVA (cerebrovascular accident)    On Plavix, Pravastatin         Meds ordered this encounter  Medications   predniSONE (DELTASONE) 5 MG tablet    Sig: Take 1 tablet (5 mg total) by mouth daily with breakfast.    Dispense:  30 tablet    Refill:  1      Follow-up: Return in about 4 weeks (around 07/22/2021) for a follow-up visit.  [name], MD

## 2021-06-24 NOTE — Assessment & Plan Note (Signed)
Cont with Coreg, Hydralazine Hydrate well 

## 2021-06-24 NOTE — Assessment & Plan Note (Signed)
On Plavix, Pravastatin ?

## 2021-06-24 NOTE — Assessment & Plan Note (Signed)
R leg pain - pulled hamstring muscle - on Tramadol 3/d - seeing Dr Erlinda Hong. MRI shows rupture of the proximal hamstring of the abductor magnus component. Pain is 9/10 and 3-5/10 on Tramadol. ?

## 2021-06-24 NOTE — Assessment & Plan Note (Addendum)
R leg pain - pulled hamstring muscle - on Tramadol 3/d - seeing Dr Erlinda Hong. MRI shows rupture of the proximal hamstring of the abductor magnus component. Pain is 9/10 and 3-5/10 on Tramadol. ?Try TENS unit ?Rice sock heating pad ?Massage pad ?Will try low dose Prednisone 5 mg/d ?Consider skelaxin ?

## 2021-06-25 ENCOUNTER — Other Ambulatory Visit: Payer: Self-pay | Admitting: Internal Medicine

## 2021-06-25 LAB — LAB REPORT - SCANNED
Albumin, Urine POC: 7.5
Calcium: 9.1
Creatinine, POC: 143.3 mg/dL
EGFR: 65
Microalb Creat Ratio: 5
PTH: 30

## 2021-07-08 ENCOUNTER — Telehealth: Payer: Self-pay | Admitting: Adult Health

## 2021-07-08 NOTE — Telephone Encounter (Signed)
NP out on 5/17- LVM on home and cell #s, sent mychart message asking pt to call us back and r/s. ?

## 2021-07-29 ENCOUNTER — Ambulatory Visit: Payer: Medicare Other | Admitting: Internal Medicine

## 2021-07-29 ENCOUNTER — Encounter: Payer: Self-pay | Admitting: Internal Medicine

## 2021-07-29 DIAGNOSIS — N183 Chronic kidney disease, stage 3 unspecified: Secondary | ICD-10-CM | POA: Diagnosis not present

## 2021-07-29 DIAGNOSIS — M25551 Pain in right hip: Secondary | ICD-10-CM | POA: Diagnosis not present

## 2021-07-29 DIAGNOSIS — I10 Essential (primary) hypertension: Secondary | ICD-10-CM | POA: Diagnosis not present

## 2021-07-29 DIAGNOSIS — G8929 Other chronic pain: Secondary | ICD-10-CM

## 2021-07-29 NOTE — Progress Notes (Signed)
? ?Subjective:  ?Patient ID: Larry Stone, male    DOB: [birth year]  Age: 71 y.o. MRN: [id number] ? ?CC: No chief complaint on file. ? ? ?HPI ?Larry Stone presents for R leg pain - pulled hamstring muscle. Pain is 7/10 at times. Pain free at rest. Using Tramadol prn. Prednisone helped... ?F/u HTN, CRI ? ?Outpatient Medications Prior to Visit  ?Medication Sig Dispense Refill  ? albuterol (PROVENTIL) (2.5 MG/3ML) 0.083% nebulizer solution Use one vial in the nebulizer every 4-6 hours if needed for cough or wheeze 225 mL 11  ? ALPRAZolam (XANAX) 0.5 MG tablet TAKE 1 TABLET BY MOUTH 3 TIMES DAILY AS NEEDED. 270 tablet 0  ? ammonium lactate (LAC-HYDRIN) 12 % lotion APPLY TOPICALLY AS DIRECTED. 400 mL 3  ? budesonide-formoterol (SYMBICORT) 160-4.5 MCG/ACT inhaler INHALE 2 PUFFS INTO THE LUNGS 2 (TWO) TIMES DAILY. 3 Inhaler 0  ? buPROPion (WELLBUTRIN SR) 150 MG 12 hr tablet TAKE 1 TABLET BY MOUTH TWICE A DAY 180 tablet 3  ? carvedilol (COREG) 25 MG tablet Take 1 tablet (25 mg total) by mouth 2 (two) times daily with a meal. 180 tablet 3  ? Cholecalciferol (VITAMIN D3) 5000 UNITS CAPS Take one tablet by mouth daily    ? clopidogrel (PLAVIX) 75 MG tablet TAKE 1 TABLET BY MOUTH EVERY DAY 90 tablet 3  ? clotrimazole-betamethasone (LOTRISONE) cream APPLY AS DIRECTED 45 g 11  ? Coenzyme Q10 (COQ10) 100 MG CAPS Take as directed 30 each 11  ? ezetimibe (ZETIA) 10 MG tablet Take 1 tablet (10 mg total) by mouth daily. 90 tablet 3  ? hydrALAZINE (APRESOLINE) 50 MG tablet TAKE 1 TABLET BY MOUTH 4 TIMES DAILY. 360 tablet 3  ? meclizine (ANTIVERT) 25 MG tablet Take 0.5 tablets (12.5 mg total) by mouth 3 (three) times daily as needed for dizziness. 30 tablet 0  ? metFORMIN (GLUCOPHAGE) 500 MG tablet TAKE 1 TABLET BY MOUTH DAILY WITH BREAKFAST. OVERDUE FOR ANNUAL APPT W/ LABS FOR FUTURE REFILLS 30 tablet 11  ? montelukast (SINGULAIR) 10 MG tablet TAKE 1 TABLET BY MOUTH EVERYDAY AT BEDTIME 90 tablet 3  ? nitroGLYCERIN (NITROSTAT) 0.4  MG SL tablet Place 1 tablet (0.4 mg total) under the tongue every 5 (five) minutes as needed for chest pain. Up to 3 doses. Please make yearly appt with Dr. Angelena Form for December 2022 for future refills. Thank you 1st attempt 25 tablet 0  ? PEG-KCl-NaCl-NaSulf-Na Asc-C (PLENVU) 140 g SOLR Take 140 g by mouth as directed. Manufacturer's coupon Universal coupon code:BIN: P2366821; GROUP: [id number]; PCN: [id number]; ID: [id number]; PAY NO MORE $50 1 each 0  ? pravastatin (PRAVACHOL) 40 MG tablet TAKE 1 TABLET BY MOUTH EVERY DAY 90 tablet 3  ? predniSONE (DELTASONE) 5 MG tablet Take 1 tablet (5 mg total) by mouth daily with breakfast. 30 tablet 1  ? tiZANidine (ZANAFLEX) 4 MG tablet TAKE 1 TABLET BY MOUTH EVERY 6 HOURS AS NEEDED FOR MUSCLE SPASMS. 20 tablet 3  ? traMADol (ULTRAM) 50 MG tablet TAKE 1 TABLET BY MOUTH EVERY 6 HOURS AS NEEDED. 120 tablet 1  ? vitamin C (ASCORBIC ACID) 500 MG tablet Take 500 mg by mouth daily.     ? ?No facility-administered medications prior to visit.  ? ? ?ROS: ?Review of Systems  ?Constitutional:  Negative for appetite change, fatigue and unexpected weight change.  ?HENT:  Negative for congestion, nosebleeds, sneezing, sore throat and trouble swallowing.   ?Eyes:  Negative for itching and visual disturbance.  ?Respiratory:  Negative for cough.   ?Cardiovascular:  Negative for chest pain, palpitations and leg swelling.  ?Gastrointestinal:  Negative for abdominal distention, blood in stool, diarrhea and nausea.  ?Genitourinary:  Negative for frequency and hematuria.  ?Musculoskeletal:  Positive for arthralgias and gait problem. Negative for back pain, joint swelling and neck pain.  ?Skin:  Negative for rash.  ?Neurological:  Negative for dizziness, tremors, speech difficulty and weakness.  ?Psychiatric/Behavioral:  Negative for agitation, dysphoric mood and sleep disturbance. The patient is not nervous/anxious.   ? ?Objective:  ?BP 118/60 (BP Location: Left Arm, Patient Position: Sitting, Cuff  Size: Large)   Pulse (!) 57   Temp 97.9 ?F (36.6 ?C) (Oral)   Ht '5\' 9"'$  (1.753 m)   Wt 195 lb (88.5 kg)   SpO2 95%   BMI 28.80 kg/m?  ? ?BP Readings from Last 3 Encounters:  ?07/29/21 118/60  ?06/24/21 130/68  ?06/03/21 122/70  ? ? ?Wt Readings from Last 3 Encounters:  ?07/29/21 195 lb (88.5 kg)  ?06/24/21 189 lb (85.7 kg)  ?06/03/21 188 lb 9.6 oz (85.5 kg)  ? ? ?Physical Exam ?Constitutional:   ?   General: He is not in acute distress. ?   Appearance: He is well-developed.  ?   Comments: NAD  ?Eyes:  ?   Conjunctiva/sclera: Conjunctivae normal.  ?   Pupils: Pupils are equal, round, and reactive to light.  ?Neck:  ?   Thyroid: No thyromegaly.  ?   Vascular: No JVD.  ?Cardiovascular:  ?   Rate and Rhythm: Normal rate and regular rhythm.  ?   Heart sounds: Normal heart sounds. No murmur heard. ?  No friction rub. No gallop.  ?Pulmonary:  ?   Effort: Pulmonary effort is normal. No respiratory distress.  ?   Breath sounds: Normal breath sounds. No wheezing or rales.  ?Chest:  ?   Chest wall: No tenderness.  ?Abdominal:  ?   General: Bowel sounds are normal. There is no distension.  ?   Palpations: Abdomen is soft. There is no mass.  ?   Tenderness: There is no abdominal tenderness. There is no guarding or rebound.  ?Musculoskeletal:     ?   General: No tenderness. Normal range of motion.  ?   Cervical back: Normal range of motion.  ?Lymphadenopathy:  ?   Cervical: No cervical adenopathy.  ?Skin: ?   General: Skin is warm and dry.  ?   Findings: No rash.  ?Neurological:  ?   Mental Status: He is alert and oriented to person, place, and time.  ?   Cranial Nerves: No cranial nerve deficit.  ?   Motor: No abnormal muscle tone.  ?   Coordination: Coordination normal.  ?   Gait: Gait normal.  ?   Deep Tendon Reflexes: Reflexes are normal and symmetric.  ?Psychiatric:     ?   Behavior: Behavior normal.     ?   Thought Content: Thought content normal.     ?   Judgment: Judgment normal.  ?R thigh w/pain ? ?Lab Results   ?Component Value Date  ? WBC 9.9 12/26/2019  ? HGB 14.5 12/26/2019  ? HCT 43.0 12/26/2019  ? PLT 179 12/26/2019  ? GLUCOSE 87 02/17/2021  ? CHOL 144 06/03/2021  ? TRIG 64 06/03/2021  ? HDL 60 06/03/2021  ? [place] 71 06/03/2021  ? ALT 22 06/03/2021  ? AST 20 06/03/2021  ? NA 140 02/17/2021  ? K 4.5 02/17/2021  ? CL  104 02/17/2021  ? CREATININE 1.34 02/17/2021  ? BUN 14 02/17/2021  ? CO2 31 02/17/2021  ? TSH 1.91 11/29/2018  ? PSA 0.41 04/22/2021  ? INR 1.1 12/04/2019  ? HGBA1C 5.8 02/17/2021  ? ? ?MR Lumbar Spine w/o contrast ? ?Result Date: 12/06/2020 ?CLINICAL DATA:  Lumbar radiculopathy. EXAM: MRI LUMBAR SPINE WITHOUT CONTRAST TECHNIQUE: Multiplanar, multisequence MR imaging of the lumbar spine was performed. No intravenous contrast was administered. COMPARISON:  None. FINDINGS: Segmentation:  Standard. Alignment:  2 mm retrolisthesis of L2 on L3, L3 on L4 and L4 on L5. Vertebrae: No acute fracture, evidence of discitis, or aggressive bone lesion. Conus medullaris and cauda equina: Conus extends to the L1 level. Conus and cauda equina appear normal. Paraspinal and other soft tissues: No acute paraspinal abnormality. Disc levels: Disc spaces: Degenerative disease with disc height loss at T12-L1, L1-2, L2-3, L3-4 and L4-5. T12-L1: No significant disc bulge. No neural foraminal stenosis. No central canal stenosis. L1-L2: No disc protrusion. Mild left foraminal stenosis. No right foraminal stenosis. Mild bilateral facet arthropathy. L2-L3: Mild broad-based disc bulge. Moderate bilateral facet arthropathy. Mild spinal stenosis. Moderate left and mild right foraminal stenosis. L3-L4: Mild broad-based disc bulge. Mild bilateral facet arthropathy. Bilateral lateral recess stenosis. Mild right and moderate left foraminal stenosis. No central canal stenosis. L4-L5: Broad-based disc bulge. Moderate bilateral facet arthropathy. Moderate right foraminal stenosis. No left foraminal stenosis. No spinal stenosis. L5-S1: Mild  broad-based disc bulge. Moderate bilateral facet arthropathy, right worse than left. Reactive marrow edema involving the right facet joint. Right L5 pars interarticularis defect. Mild right foraminal stenosis. No left

## 2021-07-29 NOTE — Assessment & Plan Note (Signed)
R leg pain - pulled hamstring muscle. Pain is better -  7/10 at times. Pain free at rest. Using Tramadol prn. Prednisone helped... ?Blue-Emu cream was recommended to use 2-3 times a day ? ?

## 2021-07-29 NOTE — Assessment & Plan Note (Signed)
Cont with Coreg, Hydralazine Hydrate well 

## 2021-07-29 NOTE — Patient Instructions (Signed)
Blue-Emu cream -- use 2-3 times a day ? ?

## 2021-07-30 ENCOUNTER — Encounter: Payer: Medicare Other | Admitting: Gastroenterology

## 2021-08-10 ENCOUNTER — Telehealth: Payer: Self-pay | Admitting: Gastroenterology

## 2021-08-10 NOTE — Telephone Encounter (Signed)
Pt returned call.  Spoke to pt.  Pt wanted to confirm new prep times since his appt had been changed.  Gave pt new prep times for colonoscopy that's scheduled for tomorrow.   ?

## 2021-08-10 NOTE — Telephone Encounter (Signed)
Left VM to have patient call back ?

## 2021-08-10 NOTE — Telephone Encounter (Signed)
Inbound call from patient stating that he is scheduled to have a procedure tomorrow and is wondering if he can take his prep sooner than what he is supposed to. Please advise.  ?

## 2021-08-11 ENCOUNTER — Ambulatory Visit (AMBULATORY_SURGERY_CENTER): Payer: Medicare Other | Admitting: Gastroenterology

## 2021-08-11 ENCOUNTER — Encounter: Payer: Self-pay | Admitting: Gastroenterology

## 2021-08-11 VITALS — BP 173/73 | HR 65 | Temp 98.7°F | Resp 14 | Ht 69.0 in | Wt 195.0 lb

## 2021-08-11 DIAGNOSIS — Z86010 Personal history of colonic polyps: Secondary | ICD-10-CM

## 2021-08-11 MED ORDER — SODIUM CHLORIDE 0.9 % IV SOLN: 500.0000 mL | INTRAVENOUS | Status: DC

## 2021-08-11 NOTE — Op Note (Signed)
[place] ?Patient Name: Larry Stone ?Procedure Date: 08/11/2021 4:04 PM ?MRN: [id number] ?Endoscopist: Estill Cotta. Loletha Carrow , MD ?Age: 71 ?Referring MD:  ?Date of Birth: [birth year] ?Gender: Male ?Account #: [id number] ?Procedure:                Colonoscopy ?Indications:              Surveillance: Personal history of adenomatous  ?                          polyps on last colonoscopy > 3 years ago ?                          History of colon cancer in father ?Medicines:                Monitored Anesthesia Care ?Procedure:                Pre-Anesthesia Assessment: ?                          - Prior to the procedure, a History and Physical  ?                          was performed, and patient medications and  ?                          allergies were reviewed. The patient's tolerance of  ?                          previous anesthesia was also reviewed. The risks  ?                          and benefits of the procedure and the sedation  ?                          options and risks were discussed with the patient.  ?                          All questions were answered, and informed consent  ?                          was obtained. Prior Anticoagulants: The patient has  ?                          taken Plavix (clopidogrel), last dose was 5 days  ?                          prior to procedure. ASA Grade Assessment: III - A  ?                          patient with severe systemic disease. After  ?                          reviewing the risks and benefits, the patient was  ?  deemed in satisfactory condition to undergo the  ?                          procedure. ?                          After obtaining informed consent, the colonoscope  ?                          was passed under direct vision. Throughout the  ?                          procedure, the patient's blood pressure, pulse, and  ?                          oxygen saturations were monitored continuously. The  ?                           CF [id number] #[id number] was introduced through the anus  ?                          and advanced to the the cecum, identified by  ?                          appendiceal orifice and ileocecal valve. The  ?                          colonoscopy was performed without difficulty. The  ?                          patient tolerated the procedure well. The quality  ?                          of the bowel preparation was generally good  ?                          (scattered fibrous debris). The ileocecal valve,  ?                          appendiceal orifice, and rectum were photographed. ?Scope In: 4:13:10 PM ?Scope Out: 4:25:20 PM ?Scope Withdrawal Time: 0 hours 6 minutes 48 seconds  ?Total Procedure Duration: 0 hours 12 minutes 10 seconds  ?Findings:                 The perianal and digital rectal examinations were  ?                          normal. ?                          Multiple diverticula were found in the left colon. ?                          The exam was otherwise without abnormality on  ?  direct views. (narrow rectal anatomy precluded  ?                          retroflexion) ?Complications:            No immediate complications. ?Estimated Blood Loss:     Estimated blood loss: none. ?Impression:               - Diverticulosis in the left colon. ?                          - The examination was otherwise normal on direct  ?                          and retroflexion views. ?                          - No specimens collected. ?Recommendation:           - Patient has a contact number available for  ?                          emergencies. The signs and symptoms of potential  ?                          delayed complications were discussed with the  ?                          patient. Return to normal activities tomorrow.  ?                          Written discharge instructions were provided to the  ?                          patient. ?                          - Resume previous diet. ?                           - Resume Plavix (clopidogrel) at prior dose today. ?                          - Repeat colonoscopy in 5 years for screening  ?                          purposes (family history colon cancer). Set recall,  ?                          assess overall health at that time and weigh  ?                          risks/benefits of colonoscopy. ?Coila Wardell L. Loletha Carrow, MD ?08/11/2021 4:32:37 PM ?This report has been signed electronically. ?

## 2021-08-11 NOTE — Patient Instructions (Signed)
Please read handouts provided. ?Continue present medications. ?Resume Plavix ( clopidogrel ) at prior dose today. ?Resume previous diet. ?Repeat colonoscopy in 5 years for screening. ? ? ?YOU HAD AN ENDOSCOPIC PROCEDURE TODAY AT [facility]:   Refer to the procedure report that was given to you for any specific questions about what was found during the examination.  If the procedure report does not answer your questions, please call your gastroenterologist to clarify.  If you requested that your care partner not be given the details of your procedure findings, then the procedure report has been included in a sealed envelope for you to review at your convenience later. ? ?YOU SHOULD EXPECT: Some feelings of bloating in the abdomen. Passage of more gas than usual.  Walking can help get rid of the air that was put into your GI tract during the procedure and reduce the bloating. If you had a lower endoscopy (such as a colonoscopy or flexible sigmoidoscopy) you may notice spotting of blood in your stool or on the toilet paper. If you underwent a bowel prep for your procedure, you may not have a normal bowel movement for a few days. ? ?Please Note:  You might notice some irritation and congestion in your nose or some drainage.  This is from the oxygen used during your procedure.  There is no need for concern and it should clear up in a day or so. ? ?SYMPTOMS TO REPORT IMMEDIATELY: ? ?Following lower endoscopy (colonoscopy or flexible sigmoidoscopy): ? Excessive amounts of blood in the stool ? Significant tenderness or worsening of abdominal pains ? Swelling of the abdomen that is new, acute ? Fever of 100?F or higher ? ? ?For urgent or emergent issues, a gastroenterologist can be reached at any hour by calling [phone]. ?Do not use MyChart messaging for urgent concerns.  ? ? ?DIET:  We do recommend a small meal at first, but then you may proceed to your regular diet.  Drink plenty of fluids but you  should avoid alcoholic beverages for 24 hours. ? ?ACTIVITY:  You should plan to take it easy for the rest of today and you should NOT DRIVE or use heavy machinery until tomorrow (because of the sedation medicines used during the test).   ? ?FOLLOW UP: ?Our staff will call the number listed on your records 48-72 hours following your procedure to check on you and address any questions or concerns that you may have regarding the information given to you following your procedure. If we do not reach you, we will leave a message.  We will attempt to reach you two times.  During this call, we will ask if you have developed any symptoms of COVID 19. If you develop any symptoms (ie: fever, flu-like symptoms, shortness of breath, cough etc.) before then, please call [phone].  If you test positive for Covid 19 in the 2 weeks post procedure, please call and report this information to [place].   ? ?If any biopsies were taken you will be contacted by phone or by letter within the next 1-3 weeks.  Please call us at [phone] if you have not heard about the biopsies in 3 weeks.  ? ? ?SIGNATURES/CONFIDENTIALITY: ?You and/or your care partner have signed paperwork which will be entered into your electronic medical record.  These signatures attest to the fact that that the information above on your After Visit Summary has been reviewed and is understood.  Full responsibility of the confidentiality of  this discharge information lies with you and/or your care-partner.  ?

## 2021-08-11 NOTE — Progress Notes (Signed)
A and O x3. Report to RN. Tolerated MAC anesthesia well. 

## 2021-08-11 NOTE — Progress Notes (Signed)
History and Physical: ? This patient presents for endoscopic testing for: ?Encounter Diagnosis  ?Name Primary?  ? Personal history of colonic polyps Yes  ? ?From Jan 2023 office note: ?"Larry Stone is a very pleasant 71 year old man seen for history of colon polyps ?  ?Most recent colonoscopy June 2019 (for family history of colon cancer, prior colonoscopy 2014): 3 diminutive adenomatous polyps removed, 3-year recall recommended. ?  ?Bowel habits regular without rectal bleeding, denies chronic abdominal pain. 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  He also lately has some right-sided sciatic pain" ? ?No clinical changes since then. ? ? ?ROS: ?Patient denies chest pain or shortness of breath ? ? ?Past Medical History: ?Past Medical History:  ?Diagnosis Date  ? Allergy   ? Anxiety   ? CAD   ? a. acute inferior lateral wall infarction in September 2004 treated medically. b.  cath 06/17/16 showing mild nonobstructive CAD with 30-40% ostial LM (eccentric), elevated LV filling pressures and normal LV function.  ? Chronic diastolic CHF (congestive heart failure) ([place])   ? CKD (chronic kidney disease), stage II   ? COPD (chronic obstructive pulmonary disease) ([place])   ? Diverticulosis   ? DJD (degenerative joint disease)   ? GERD (gastroesophageal reflux disease)   ? HTN (hypertension)   ? Hyperlipidemia   ? Low back pain syndrome   ? Myocardial infarction [facility]) 2004  ? Obesity   ? OSA (obstructive sleep apnea)   ? RBBB   ? Recurrent aspiration bronchitis/pneumonia   ? Recurrent aspiration pneumonia ([place])   ? [name] 07/13/2016  ? Stroke [facility])   ? Thrombocytopenia ([place])   ? Tubular adenoma of colon 2014  ? ? ? ?Past Surgical History: ?Past Surgical History:  ?Procedure Laterality Date  ? CARDIAC CATHETERIZATION    ? LEFT HEART CATH AND CORONARY ANGIOGRAPHY N/A 06/17/2016  ? Procedure: Left Heart Cath and Coronary Angiography;  Surgeon: Burnell Blanks, MD;  Location: [facility];  Service:  Cardiovascular;  Laterality: N/A;  ? UVULOPALATOPHARYNGOPLASTY    ? surgery for OSA  ? ? ?Allergies: ?Allergies  ?Allergen Reactions  ? Ace Inhibitors Swelling  ? Angiotensin Receptor Blockers Swelling  ? Aspirin Hives  ? Amlodipine   ?  Leg swelling  ? Crestor [Rosuvastatin Calcium]   ?  myalgias  ? Lipitor [Atorvastatin Calcium]   ?  arthralgias  ? Codeine Nausea And Vomiting  ? Irbesartan Other (See Comments)  ?  REACTION: allergic to ARBs w/ angioedema  ? Ramipril Other (See Comments)  ?  REACTION: Allergic to ACE's w/ angioedema...  ? ? ?Outpatient Meds: ?Current Outpatient Medications  ?Medication Sig Dispense Refill  ? ALPRAZolam (XANAX) 0.5 MG tablet TAKE 1 TABLET BY MOUTH 3 TIMES DAILY AS NEEDED. 270 tablet 0  ? buPROPion (WELLBUTRIN SR) 150 MG 12 hr tablet TAKE 1 TABLET BY MOUTH TWICE A DAY 180 tablet 3  ? carvedilol (COREG) 25 MG tablet Take 1 tablet (25 mg total) by mouth 2 (two) times daily with a meal. 180 tablet 3  ? Cholecalciferol (VITAMIN D3) 5000 UNITS CAPS Take one tablet by mouth daily    ? Coenzyme Q10 (COQ10) 100 MG CAPS Take as directed 30 each 11  ? ezetimibe (ZETIA) 10 MG tablet Take 1 tablet (10 mg total) by mouth daily. 90 tablet 3  ? hydrALAZINE (APRESOLINE) 50 MG tablet TAKE 1 TABLET BY MOUTH 4 TIMES DAILY. 360 tablet 3  ? metFORMIN (GLUCOPHAGE) 500 MG tablet  TAKE 1 TABLET BY MOUTH DAILY WITH BREAKFAST. OVERDUE FOR ANNUAL APPT W/ LABS FOR FUTURE REFILLS 30 tablet 11  ? montelukast (SINGULAIR) 10 MG tablet TAKE 1 TABLET BY MOUTH EVERYDAY AT BEDTIME 90 tablet 3  ? pravastatin (PRAVACHOL) 40 MG tablet TAKE 1 TABLET BY MOUTH EVERY DAY 90 tablet 3  ? predniSONE (DELTASONE) 5 MG tablet Take 1 tablet (5 mg total) by mouth daily with breakfast. 30 tablet 1  ? traMADol (ULTRAM) 50 MG tablet TAKE 1 TABLET BY MOUTH EVERY 6 HOURS AS NEEDED. 120 tablet 1  ? vitamin C (ASCORBIC ACID) 500 MG tablet Take 500 mg by mouth daily.     ? albuterol (PROVENTIL) (2.5 MG/3ML) 0.083% nebulizer solution Use one  vial in the nebulizer every 4-6 hours if needed for cough or wheeze 225 mL 11  ? ammonium lactate (LAC-HYDRIN) 12 % lotion APPLY TOPICALLY AS DIRECTED. 400 mL 3  ? budesonide-formoterol (SYMBICORT) 160-4.5 MCG/ACT inhaler INHALE 2 PUFFS INTO THE LUNGS 2 (TWO) TIMES DAILY. 3 Inhaler 0  ? clopidogrel (PLAVIX) 75 MG tablet TAKE 1 TABLET BY MOUTH EVERY DAY 90 tablet 3  ? clotrimazole-betamethasone (LOTRISONE) cream APPLY AS DIRECTED 45 g 11  ? meclizine (ANTIVERT) 25 MG tablet Take 0.5 tablets (12.5 mg total) by mouth 3 (three) times daily as needed for dizziness. 30 tablet 0  ? nitroGLYCERIN (NITROSTAT) 0.4 MG SL tablet Place 1 tablet (0.4 mg total) under the tongue every 5 (five) minutes as needed for chest pain. Up to 3 doses. Please make yearly appt with Dr. Angelena Form for December 2022 for future refills. Thank you 1st attempt 25 tablet 0  ? PEG-KCl-NaCl-NaSulf-Na Asc-C (PLENVU) 140 g SOLR Take 140 g by mouth as directed. Manufacturer's coupon Universal coupon code:BIN: P2366821; GROUP: [id number]; PCN: [id number]; ID: [id number]; PAY NO MORE $50 1 each 0  ? tiZANidine (ZANAFLEX) 4 MG tablet TAKE 1 TABLET BY MOUTH EVERY 6 HOURS AS NEEDED FOR MUSCLE SPASMS. 20 tablet 3  ? ?Current Facility-Administered Medications  ?Medication Dose Route Frequency Provider Last Rate Last Admin  ? 0.9 %  sodium chloride infusion  500 mL Intravenous Continuous Danis, Kirke Corin, MD      ? ? ? ? ?___________________________________________________________________ ?Objective  ? ?Exam: ? ?BP (!) 144/62   Pulse (!) 58   Temp 98.7 ?F (37.1 ?C)   Ht '5\' 9"'$  (1.753 m)   Wt 195 lb (88.5 kg)   SpO2 98%   BMI 28.80 kg/m?  ? ?CV: RRR without murmur, S1/S2 ?Resp: clear to auscultation bilaterally, normal RR and effort noted ?GI: soft, no tenderness, with active bowel sounds. ? ? ?Assessment: ?Encounter Diagnosis  ?Name Primary?  ? Personal history of colonic polyps Yes  ? ? ? ?Plan: ?Colonoscopy ? The benefits and risks of the planned procedure were  described in detail with the patient or (when appropriate) their health care proxy.  Risks were outlined as including, but not limited to, bleeding, infection, perforation, adverse medication reaction leading to cardiac or pulmonary decompensation, pancreatitis (if ERCP).  The limitation of incomplete mucosal visualization was also discussed.  No guarantees or warranties were given. ? ? ? ?The patient is appropriate for an endoscopic procedure in the ambulatory setting. ? ? - Wilfrid Lund, MD ? ? ? ? ?

## 2021-08-13 ENCOUNTER — Telehealth: Payer: Self-pay

## 2021-08-13 NOTE — Telephone Encounter (Signed)
?  Follow up Call- ? ? ?  08/11/2021  ?  2:41 PM  ?Call back number  ?Post procedure Call Back phone  # [phone]  ?Permission to leave phone message Yes  ?  ? ?Patient questions: ? ?Do you have a fever, pain , or abdominal swelling? No. ?Pain Score  0 * ? ?Have you tolerated food without any problems? Yes.   ? ?Have you been able to return to your normal activities? Yes.   ? ?Do you have any questions about your discharge instructions: ?Diet   No. ?Medications  No. ?Follow up visit  No. ? ?Do you have questions or concerns about your Care? No. ? ?Actions: ?* If pain score is 4 or above: ?No action needed, pain <4. ? ? ?

## 2021-08-16 ENCOUNTER — Other Ambulatory Visit: Payer: Self-pay | Admitting: Internal Medicine

## 2021-08-19 ENCOUNTER — Other Ambulatory Visit: Payer: Self-pay | Admitting: Cardiovascular Disease

## 2021-09-02 ENCOUNTER — Ambulatory Visit: Payer: Medicare Other | Admitting: Adult Health

## 2021-09-10 ENCOUNTER — Other Ambulatory Visit: Payer: Self-pay | Admitting: Internal Medicine

## 2021-09-15 ENCOUNTER — Other Ambulatory Visit: Payer: Self-pay | Admitting: Cardiovascular Disease

## 2021-09-15 MED ORDER — NITROGLYCERIN 0.4 MG SL SUBL: 0.4000 mg | SUBLINGUAL_TABLET | SUBLINGUAL | 2 refills | Status: DC | PRN

## 2021-09-15 NOTE — Telephone Encounter (Signed)
Pt's medications were sent to pt's pharmacy as requested. Confirmation received.  

## 2021-09-16 ENCOUNTER — Encounter: Payer: Self-pay | Admitting: Adult Health

## 2021-09-16 ENCOUNTER — Ambulatory Visit: Payer: Medicare Other | Admitting: Adult Health

## 2021-09-16 VITALS — BP 147/76 | HR 61 | Ht 69.0 in | Wt 196.0 lb

## 2021-09-16 DIAGNOSIS — I6381 Other cerebral infarction due to occlusion or stenosis of small artery: Secondary | ICD-10-CM | POA: Diagnosis not present

## 2021-09-16 DIAGNOSIS — G890 Central pain syndrome: Secondary | ICD-10-CM

## 2021-09-16 MED ORDER — DULOXETINE HCL 30 MG PO CPEP
30.0000 mg | ORAL_CAPSULE | Freq: Every day | ORAL | 5 refills | Status: DC
Start: 2021-09-16 — End: 2021-10-01

## 2021-09-16 NOTE — Progress Notes (Signed)
[facility] [address] [place]. [place] [address] [phone]       OFFICE FOLLOW UP NOTE  Larry Stone Date of Birth:  05-Sep-1950 Medical Record Number:  [id number] Take care: Referring MD: Larry Stone  Reason for Referral: Stroke   Chief Complaint  Patient presents with   Follow-up    Rm 2 alone Pt is well and stable, no new concerns        HPI:   Update 09/16/2021 Larry Stone: Patient returns for 53-monthstroke follow-up.  He has been stable from stroke standpoint without new stroke/TIA symptoms.  Reports continued right hand and face dysesthesias. He has greater difficulty with his hand which he feels can fluctuate between feeling cold and hot.  Use of compression glove provides some relief. Continues to have difficulty with right hamstring tear - currently using tramadol which helps pain some but is still limited in activity.  This is managed by PCP.  Compliant on Plavix, pravastatin and Zetia, denies side effects.  Blood pressure today 147/76. Monitors at home and typically 130s/70s.  Routinely follows with PCP and cardiology.  No further concerns at this time    History provided for reference purposes only Update 03/03/2021 Larry Stone: Returns for 43-monthtroke follow-up unaccompanied  Overall stable -denies new stroke/TIA symptoms Continued right hand and face post stroke pain - previously rx'd amitriptyline (for both pain and anxiety) but did not take due to interaction with bupropion.  Prior intolerance to topiramate and pregabalin.  He does continue to experience anxiety -currently on bupropion and Xanax managed by PCP Has been dealing with right hamstring tear - has been placed on tramadol due to severe pain - questions if tramadol has been helping post stroke pain some or if he hasn't been as focused on post stroke pain due to severe pain of right thigh - being followed by ortho Larry Stone on Plavix, pravastatin and Zetia -denies side  effects Blood pressure today 115/64 Recent A1c 5.8 on metformin 500 mg daily  No new concerns at this time  Update 10/27/2020 Larry Stone: Larry Stone for 4 month stroke follow-up unaccompanied.  Stable from stroke standpoint without new stroke/TIA symptoms.  He continues to have difficulty with right face, hand and occasional foot paresthesias describing as burning and electrical shock sensation.  He has previously trialed pregabalin and topiramate but unable to tolerate.  Previously complained worsening gait difficulty, slurred speech and cognitive impairment after initiating pregabalin which resolved shortly after discontinuing.  He is currently participating in PT for right posterior thigh pain followed by orthopedics Larry Stone He also reports memory changes since his stroke including increased irritability and low tolerance levels - hx of depression and anxiety currently on bupropion and Xanax as needed per PCP. Reports compliance on Plavix, pravastatin and Zetia without associated side effects.  Blood pressure today 134/72.  No further concerns at this time.   Update 06/19/2020 Larry Stone: Larry Stone for stroke follow-up after prior visit approximately 5 months ago with Larry Stone Topiramate initiated at prior visit for poststroke paresthesias but unable to tolerate. Placed on Lyrica 50 mg twice daily beginning of November and reports approximately 2 weeks after, he was noticing increased lethargy and fluctuation of gait impairment with imbalance (getting pulled to the right) and lightheadedness, slurred speech and cognitive difficulties such as delayed responses and short-term memory difficulties.  Gait impairment with imbalance and slurred speech all present initially with stroke.  Denies new stroke/TIA symptoms.  He is unsure if Lyrica was beneficial for paresthesias as he does report some improvement since prior visit but right hand and right facial paresthesias still present. He also admits to  struggling with depression with history of depression PTA on bupropion 150 mg twice daily but noticed worsening after his stroke.  He is also on Xanax as needed per PCP He has been trying to keep active and does report doing HEP daily as advised during therapy sessions  Reports compliance on Plavix and pravastatin and Zetia-denies side effects Blood pressure today 135/66  No further concerns at this time  Initial visit 02/06/2020 Larry Stone: Larry Stone is a pleasant 71 year old Caucasian male with past medical history of hypertension, hyper lipidemia, diabetes, congestive heart failure and coronary artery disease who developed sudden onset of right lower face and hand numbness as well as imbalance and difficulty walking on 12/03/2019.  He did not seek medical help the same day as if waited for it to get better.  Next day he went to the hospital but got tired waiting in the ER since he was not seen and instead went to see his primary physician Larry Stone who ordered an outpatient stroke work-up.  MRI scan of the brain done on 12/20/2019 shows a subacute left thalamic lacunar infarct.  Carotid ultrasound on 12/10/2019 showed no significant extracranial stenosis.  Echocardiogram on 12/28/2018 showed slightly diminished ejection fraction of 45 to 50% but no clot or definite cardiac source of embolism was noted.  He underwent 30 days of outpatient telemetry cardiac monitoring which was negative for atrial fibrillation or significant arrhythmias.  Carotid ultrasound on 12/10/2019 was unremarkable.  LDL cholesterol was 78 mg percent on 12/26/2019.  Patient is presently on Plavix 75 mg daily which is tolerating well without bruising or bleeding.  He states his gait and balance have improved and he has finished outpatient physical and occupational therapy.  He still stumbles occasionally but as long to walk slowly and carefully.  He has had no falls or injuries.  He however still has persistent paresthesias in his right  face and right hand which are bothersome and he would like to try some medications for this.  He has had no recurrent stroke or TIA symptoms.  States his blood pressure is well controlled and today it is 113/65 in office.  States his sugars are also doing well however he cannot tell me when his last hemoglobin A1c was checked.  He has been scheduled to undergo loop recorder pending my consultation visit today.  ROS:   14 system review of systems is positive for those listed in HPI and all other systems negative  PMH:  Past Medical History:  Diagnosis Date   Allergy    Anxiety    CAD    a. acute inferior lateral wall infarction in September 2004 treated medically. b.  cath 06/17/16 showing mild nonobstructive CAD with 30-40% ostial LM (eccentric), elevated LV filling pressures and normal LV function.   Chronic diastolic CHF (congestive heart failure) (HCC)    CKD (chronic kidney disease), stage II    COPD (chronic obstructive pulmonary disease) (HCC)    Diverticulosis    DJD (degenerative joint disease)    GERD (gastroesophageal reflux disease)    HTN (hypertension)    Hyperlipidemia    Low back pain syndrome    Myocardial infarction ([place]) 2004   Obesity    OSA (obstructive sleep apnea)    RBBB    Recurrent aspiration bronchitis/pneumonia  Recurrent aspiration pneumonia ([place])    [name] 07/13/2016   Stroke ([place])    Thrombocytopenia (HCC)    Tubular adenoma of colon 2014    Social History:  Social History   Socioeconomic History   Marital status: Married    Spouse name: Not on file   Number of children: 1   Years of education: Not on file   Highest education level: Not on file  Occupational History   Occupation: [employer]  Tobacco Use   Smoking status: Former    Packs/day: 1.00    Years: 20.00    Pack years: 20.00    Types: Cigarettes, Cigars    Quit date: 05/08/1998    Years since quitting: 23.3   Smokeless tobacco: Never   Tobacco comments:     quit in 2005  Vaping Use   Vaping Use: Never used  Substance and Sexual Activity   Alcohol use: Yes    Alcohol/week: 1.0 standard drink    Types: 1 Standard drinks or equivalent per week    Comment: rarely   Drug use: No   Sexual activity: Yes  Other Topics Concern   Not on file  Social History Narrative   Lives with spouse only   Right Handed   Drinks <1 cup caffeine daily   Social Determinants of Health   Financial Resource Strain: Low Risk    Difficulty of Paying Living Expenses: Not hard at all  Food Insecurity: No Food Insecurity   Worried About Charity fundraiser in the Last Year: Never true   Ran Out of Food in the Last Year: Never true  Transportation Needs: No Transportation Needs   Lack of Transportation (Medical): No   Lack of Transportation (Non-Medical): No  Physical Activity: Sufficiently Active   Days of Exercise per Week: 5 days   Minutes of Exercise per Session: 30 min  Stress: No Stress Concern Present   Feeling of Stress : Not at all  Social Connections: Unknown   Frequency of Communication with Friends and Family: More than three times a week   Frequency of Social Gatherings with Friends and Family: More than three times a week   Attends Religious Services: Patient refused   Marine scientist or Organizations: Patient refused   Attends Music therapist: Patient refused   Marital Status: Married  Human resources officer Violence: Not At Risk   Fear of Current or Ex-Partner: No   Emotionally Abused: No   Physically Abused: No   Sexually Abused: No    Medications:   Current Outpatient Medications on File Prior to Visit  Medication Sig Dispense Refill   albuterol (PROVENTIL) (2.5 MG/3ML) 0.083% nebulizer solution Use one vial in the nebulizer every 4-6 hours if needed for cough or wheeze 225 mL 11   ALPRAZolam (XANAX) 0.5 MG tablet TAKE 1 TABLET BY MOUTH 3 TIMES DAILY AS NEEDED. 270 tablet 0   ammonium lactate (LAC-HYDRIN) 12 % lotion  APPLY TOPICALLY AS DIRECTED. 400 mL 3   budesonide-formoterol (SYMBICORT) 160-4.5 MCG/ACT inhaler INHALE 2 PUFFS INTO THE LUNGS 2 (TWO) TIMES DAILY. 3 Inhaler 0   buPROPion (WELLBUTRIN SR) 150 MG 12 hr tablet TAKE 1 TABLET BY MOUTH TWICE A DAY 180 tablet 3   carvedilol (COREG) 25 MG tablet Take 1 tablet (25 mg total) by mouth 2 (two) times daily with a meal. 180 tablet 3   Cholecalciferol (VITAMIN D3) 5000 UNITS CAPS Take one tablet by mouth daily  clopidogrel (PLAVIX) 75 MG tablet Take 1 tablet (75 mg total) by mouth daily. 90 tablet 2   clotrimazole-betamethasone (LOTRISONE) cream APPLY AS DIRECTED 45 g 11   Coenzyme Q10 (COQ10) 100 MG CAPS Take as directed 30 each 11   ezetimibe (ZETIA) 10 MG tablet Take 1 tablet (10 mg total) by mouth daily. 90 tablet 3   hydrALAZINE (APRESOLINE) 50 MG tablet TAKE 1 TABLET BY MOUTH 4 TIMES DAILY. 360 tablet 3   meclizine (ANTIVERT) 25 MG tablet Take 0.5 tablets (12.5 mg total) by mouth 3 (three) times daily as needed for dizziness. 30 tablet 0   metFORMIN (GLUCOPHAGE) 500 MG tablet TAKE 1 TABLET BY MOUTH DAILY WITH BREAKFAST. OVERDUE FOR ANNUAL APPT W/ LABS FOR FUTURE REFILLS 30 tablet 11   montelukast (SINGULAIR) 10 MG tablet TAKE 1 TABLET BY MOUTH EVERYDAY AT BEDTIME 90 tablet 3   nitroGLYCERIN (NITROSTAT) 0.4 MG SL tablet Place 1 tablet (0.4 mg total) under the tongue every 5 (five) minutes as needed for chest pain. Up to 3 doses. 75 tablet 2   pravastatin (PRAVACHOL) 40 MG tablet TAKE 1 TABLET BY MOUTH EVERY DAY 90 tablet 3   predniSONE (DELTASONE) 5 MG tablet TAKE 1 TABLET BY MOUTH EVERY DAY WITH BREAKFAST 30 tablet 1   tiZANidine (ZANAFLEX) 4 MG tablet TAKE 1 TABLET BY MOUTH EVERY 6 HOURS AS NEEDED FOR MUSCLE SPASMS. 20 tablet 3   traMADol (ULTRAM) 50 MG tablet TAKE 1 TABLET BY MOUTH EVERY 6 HOURS AS NEEDED 120 tablet 1   vitamin C (ASCORBIC ACID) 500 MG tablet Take 500 mg by mouth daily.      No current facility-administered medications on file  prior to visit.    Allergies:   Allergies  Allergen Reactions   Ace Inhibitors Swelling   Angiotensin Receptor Blockers Swelling   Aspirin Hives   Amlodipine     Leg swelling   Crestor [Rosuvastatin Calcium]     myalgias   Lipitor [Atorvastatin Calcium]     arthralgias   Codeine Nausea And Vomiting   Irbesartan Other (See Comments)    REACTION: allergic to ARBs w/ angioedema   Ramipril Other (See Comments)    REACTION: Allergic to ACE's w/ angioedema...    Physical Exam Today's Vitals   09/16/21 1332  BP: (!) 147/76  Pulse: 61  Weight: 196 lb (88.9 kg)  Height: '5\' 9"'$  (1.753 m)   Body mass index is 28.94 kg/m.    General: well developed, well nourished pleasant elderly Caucasian male, seated, in no evident distress Head: head normocephalic and atraumatic.   Neck: supple with no carotid or supraclavicular bruits Cardiovascular: regular rate and rhythm, no murmurs Musculoskeletal: no deformity Skin:  no rash/petichiae Vascular:  Normal pulses all extremities  Neurologic Exam Mental Status: Awake and fully alert.  Fluent speech and language. Oriented to place and time. Recent and remote memory intact. Attention span, concentration and fund of knowledge appropriate during visit. Mood and affect appropriate.  Cranial Nerves: Pupils equal, briskly reactive to light. Extraocular movements full without nystagmus. Visual fields full to confrontation. Hearing intact. Facial sensation intact.  Mild right nasolabial fold flattening.  Tongue, palate moves normally and symmetrically.  Motor: Normal bulk and tone. Normal strength in all tested extremity muscles. Sensory.: intact to touch , pinprick , position and vibratory sensation -subjective paresthesias in the right face and hand. Coordination: Rapid alternating movements normal in all extremities. Finger-to-nose and heel-to-shin performed accurately bilaterally. Gait and Station: Arises from chair without  difficulty. Stance is  normal. Gait demonstrates normal stride length and balance without use of assistive device.  Mild to moderate difficulty with heel, toe and tandem walk.   Reflexes: 1+ and symmetric. Toes downgoing.        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  Vascular risk factors of hypertension hyperlipidemia coronary artery disease.      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  Discussed potential interaction between duloxetine and tramadol and to monitor for symptoms for which she verbalized understanding.  Advised to call after a few weeks if no benefit or sooner if any difficulty tolerating -intolerant to pregabalin and topiramate. Unable to rx majority of antidepressants due to potential interactions with current medications.   -If he continues to experience pain on duloxetine or difficulty tolerating, may need to consider evaluation at pain clinic for further treatment options -Discussed use of compression glove for hand dysesthesias -Continue Plavix 75 mg daily, pravastatin and Zetia for secondary stroke prevention -Discussed secondary stroke prevention measures and importance of close PCP follow-up for aggressive stroke risk factor management including BP goal<130/90, HLD with LDL goal<70 and DM with A1c.<7     Follow-up in 6 months or call earlier if needed   CC:  Plotnikov, Evie Lacks, MD   I spent 31 minutes of face-to-face and non-face-to-face time with patient.  This included previsit chart review, lab review, study review, order entry, electronic health record documentation, patient education and discussion regarding history  of prior stroke with residual deficits and further treatment options, including secondary stroke prevention and aggressive stroke risk factor management and answered all other questions to patient satisfaction  Frann Rider, [facility]  [facility] [address] [place], [place] [address]  Phone [phone] Fax [phone] Note: This document was prepared with digital dictation and possible smart phrase technology. Any transcriptional errors that result from this process are unintentional.

## 2021-09-16 NOTE — Patient Instructions (Signed)
Start duloxetine '30mg'$  daily to hopefully help with your pains  Please let me know after a few weeks if no benefit or sooner if any difficulty tolerating   Continue clopidogrel 75 mg daily  and pravastatin and zetia  for secondary stroke prevention  Continue to follow up with PCP regarding cholesterol, blood pressure and diabetes management  Maintain strict control of hypertension with blood pressure goal below 130/90, diabetes with hemoglobin A1c goal below 7.0 % and cholesterol with LDL cholesterol (bad cholesterol) goal below 70 mg/dL.   Signs of a Stroke? Follow the BEFAST method:  Balance Watch for a sudden loss of balance, trouble with coordination or vertigo Eyes Is there a sudden loss of vision in one or both eyes? Or double vision?  Face: Ask the person to smile. Does one side of the face droop or is it numb?  Arms: Ask the person to raise both arms. Does one arm drift downward? Is there weakness or numbness of a leg? Speech: Ask the person to repeat a simple phrase. Does the speech sound slurred/strange? Is the person confused ? Time: If you observe any of these signs, call 911.     Followup in the future with me in 6 months or call earlier if needed      Thank you for coming to see [place] at [facility]. I hope we have been able to provide you high quality care today.  You may receive a patient satisfaction survey over the next few weeks. We would appreciate your feedback and comments so that we may continue to improve ourselves and the health of our patients.     Duloxetine Delayed-Release Capsules What is this medication? DULOXETINE (doo LOX e teen) treats depression, anxiety, fibromyalgia, and certain types of chronic pain such as nerve, bone, or joint pain. It increases the amount of serotonin and norepinephrine in the brain, hormones that help regulate mood and pain. It belongs to a group of medications called SNRIs. This medicine may be used for  other purposes; ask your health care provider or pharmacist if you have questions. COMMON BRAND NAME(S): Cymbalta, Drizalma, Irenka What should I tell my care team before I take this medication? They need to know if you have any of these conditions: Bipolar disorder Glaucoma High blood pressure Kidney disease Liver disease Seizures Suicidal thoughts, plans or attempt; a previous suicide attempt by you or a family member Take medications that treat or prevent blood clots Taken medications called MAOIs like Carbex, Eldepryl, Marplan, Nardil, and Parnate within 14 days Trouble passing urine An unusual reaction to duloxetine, other medications, foods, dyes, or preservatives Pregnant or trying to get pregnant Breast-feeding How should I use this medication? Take this medication by mouth with a glass of water. Follow the directions on the prescription label. Do not crush, cut or chew some capsules of this medication. Some capsules may be opened and sprinkled on applesauce. Check with your care team or pharmacist if you are not sure. You can take this medication with or without food. Take your medication at regular intervals. Do not take your medication more often than directed. Do not stop taking this medication suddenly except upon the advice of your care team. Stopping this medication too quickly may cause serious side effects or your condition may worsen. A special MedGuide will be given to you by the pharmacist with each prescription and refill. Be sure to read this information carefully each time. Talk to your care team regarding the use  of this medication in children. While this medication may be prescribed for children as young as 50 years of age for selected conditions, precautions do apply. Overdosage: If you think you have taken too much of this medicine contact a poison control center or emergency room at once. NOTE: This medicine is only for you. Do not share this medicine with  others. What if I miss a dose? If you miss a dose, take it as soon as you can. If it is almost time for your next dose, take only that dose. Do not take double or extra doses. What may interact with this medication? Do not take this medication with any of the following: Desvenlafaxine Levomilnacipran Linezolid MAOIs like Carbex, Eldepryl, Emsam, Marplan, Nardil, and Parnate Methylene blue (injected into a vein) Milnacipran Safinamide Thioridazine Venlafaxine Viloxazine This medication may also interact with the following: Alcohol Amphetamines Aspirin and aspirin-like medications Certain antibiotics like ciprofloxacin and enoxacin Certain medications for blood pressure, heart disease, irregular heart beat Certain medications for depression, anxiety, or psychotic disturbances Certain medications for migraine headache like almotriptan, eletriptan, frovatriptan, naratriptan, rizatriptan, sumatriptan, zolmitriptan Certain medications that treat or prevent blood clots like warfarin, enoxaparin, and dalteparin Cimetidine Fentanyl Lithium NSAIDS, medications for pain and inflammation, like ibuprofen or naproxen Phentermine Procarbazine Rasagiline Sibutramine St. John's wort Theophylline Tramadol Tryptophan This list may not describe all possible interactions. Give your health care provider a list of all the medicines, herbs, non-prescription drugs, or dietary supplements you use. Also tell them if you smoke, drink alcohol, or use illegal drugs. Some items may interact with your medicine. What should I watch for while using this medication? Tell your care team if your symptoms do not get better or if they get worse. Visit your care team for regular checks on your progress. Because it may take several weeks to see the full effects of this medication, it is important to continue your treatment as prescribed by your care team. This medication may cause serious skin reactions. They can  happen weeks to months after starting the medication. Contact your care team right away if you notice fevers or flu-like symptoms with a rash. The rash may be red or purple and then turn into blisters or peeling of the skin. Or, you might notice a red rash with swelling of the face, lips, or lymph nodes in your neck or under your arms. Watch for new or worsening thoughts of suicide or depression. This includes sudden changes in mood, behaviors, or thoughts. These changes can happen at any time but are more common in the beginning of treatment or after a change in dose. Call your care team right away if you experience these thoughts or worsening depression. Manic episodes may happen in patients with bipolar disorder who take this medication. Watch for changes in feelings or behaviors such as feeling anxious, nervous, agitated, panicky, irritable, hostile, aggressive, impulsive, severely restless, overly excited and hyperactive, or trouble sleeping. These symptoms can happen at any time, but are more common in the beginning of treatment or after a change in dose. Call your care team right away if you notice any of these symptoms. You may get drowsy or dizzy. Do not drive, use machinery, or do anything that needs mental alertness until you know how this medication affects you. Do not stand or sit up quickly, especially if you are an older patient. This reduces the risk of dizzy or fainting spells. Alcohol may interfere with the effect of this medication. Avoid alcoholic drinks.  This medication may increase blood sugar. The risk may be higher in patients who already have diabetes. Ask your care team what you can do to lower your risk of diabetes while taking this medication. This medication can cause an increase in blood pressure. This medication can also cause a sudden drop in your blood pressure, which may make you feel faint and increase the chance of a fall. These effects are most common when you first start  the medication or when the dose is increased, or during use of other medications that can cause a sudden drop in blood pressure. Check with your care team for instructions on monitoring your blood pressure while taking this medication. Your mouth may get dry. Chewing sugarless gum or sucking hard candy, and drinking plenty of water, may help. Contact your care team if the problem does not go away or is severe. What side effects may I notice from receiving this medication? Side effects that you should report to your care team as soon as possible: Allergic reactions--skin rash, itching, hives, swelling of the face, lips, tongue, or throat Bleeding--bloody or black, tar-like stools, red or dark brown urine, vomiting blood or brown material that looks like coffee grounds, small, red or purple spots on skin, unusual bleeding or bruising Increase in blood pressure Liver injury--right upper belly pain, loss of appetite, nausea, light-colored stool, dark yellow or brown urine, yellowing skin or eyes, unusual weakness or fatigue Low sodium level--muscle weakness, fatigue, dizziness, headache, confusion Redness, blistering, peeling, or loosening of the skin, including inside the mouth Serotonin syndrome--irritability, confusion, fast or irregular heartbeat, muscle stiffness, twitching muscles, sweating, high fever, seizures, chills, vomiting, diarrhea Sudden eye pain or change in vision such as blurry vision, seeing halos around lights, vision loss Thoughts of suicide or self-harm, worsening mood, feelings of depression Trouble passing urine Side effects that usually do not require medical attention (report to your care team if they continue or are bothersome): Change in sex drive or performance Constipation Diarrhea Dizziness Dry mouth Excessive sweating Loss of appetite Nausea Vomiting This list may not describe all possible side effects. Call your doctor for medical advice about side effects. You  may report side effects to FDA at 1-800-FDA-1088. Where should I keep my medication? Keep out of the reach of children and pets. Store at room temperature between 15 and 30 degrees C (59 to 86 degrees F). Get rid of any unused medication after the expiration date. To get rid of medications that are no longer needed or have expired: Take the medication to a medication take-back program. Check with your pharmacy or law enforcement to find a location. If you cannot return the medication, check the label or package insert to see if the medication should be thrown out in the garbage or flushed down the toilet. If you are not sure, ask your care team. If it is safe to put it in the trash, take the medication out of the container. Mix the medication with cat litter, dirt, coffee grounds, or other unwanted substance. Seal the mixture in a bag or container. Put it in the trash. NOTE: This sheet is a summary. It may not cover all possible information. If you have questions about this medicine, talk to your doctor, pharmacist, or health care provider.  2023 Elsevier/Gold Standard (2020-04-15 00:00:00)

## 2021-09-20 ENCOUNTER — Other Ambulatory Visit: Payer: Self-pay | Admitting: Internal Medicine

## 2021-09-21 NOTE — Telephone Encounter (Signed)
Check [place] registry last filled 06/22/2021...Johny Chess

## 2021-09-30 ENCOUNTER — Ambulatory Visit: Payer: Medicare Other | Admitting: Internal Medicine

## 2021-10-01 ENCOUNTER — Ambulatory Visit: Payer: Medicare Other | Admitting: Internal Medicine

## 2021-10-01 ENCOUNTER — Encounter: Payer: Self-pay | Admitting: Internal Medicine

## 2021-10-01 VITALS — BP 120/68 | HR 55 | Temp 98.6°F | Ht 69.0 in | Wt 191.5 lb

## 2021-10-01 DIAGNOSIS — Z8673 Personal history of transient ischemic attack (TIA), and cerebral infarction without residual deficits: Secondary | ICD-10-CM | POA: Diagnosis not present

## 2021-10-01 DIAGNOSIS — R739 Hyperglycemia, unspecified: Secondary | ICD-10-CM

## 2021-10-01 DIAGNOSIS — S76311S Strain of muscle, fascia and tendon of the posterior muscle group at thigh level, right thigh, sequela: Secondary | ICD-10-CM | POA: Diagnosis not present

## 2021-10-01 DIAGNOSIS — F419 Anxiety disorder, unspecified: Secondary | ICD-10-CM

## 2021-10-01 DIAGNOSIS — N2889 Other specified disorders of kidney and ureter: Secondary | ICD-10-CM

## 2021-10-01 DIAGNOSIS — N183 Chronic kidney disease, stage 3 unspecified: Secondary | ICD-10-CM

## 2021-10-01 NOTE — Progress Notes (Signed)
Subjective:  Patient ID: Larry Stone, male    DOB: 08-10-1950  Age: 71 y.o. MRN: [id number]  CC: Follow-up (No concerns )   HPI Larry Stone presents for R hip pain - chronic x 1 year, CRI, HTN. Tramadol is helping  Outpatient Medications Prior to Visit  Medication Sig Dispense Refill   albuterol (PROVENTIL) (2.5 MG/3ML) 0.083% nebulizer solution Use one vial in the nebulizer every 4-6 hours if needed for cough or wheeze 225 mL 11   ALPRAZolam (XANAX) 0.5 MG tablet TAKE 1 TABLET BY MOUTH THREE TIMES A DAY AS NEEDED 270 tablet 0   ammonium lactate (LAC-HYDRIN) 12 % lotion APPLY TOPICALLY AS DIRECTED. 400 mL 3   budesonide-formoterol (SYMBICORT) 160-4.5 MCG/ACT inhaler INHALE 2 PUFFS INTO THE LUNGS 2 (TWO) TIMES DAILY. 3 Inhaler 0   buPROPion (WELLBUTRIN SR) 150 MG 12 hr tablet TAKE 1 TABLET BY MOUTH TWICE A DAY 180 tablet 3   carvedilol (COREG) 25 MG tablet Take 1 tablet (25 mg total) by mouth 2 (two) times daily with a meal. 180 tablet 3   Cholecalciferol (VITAMIN D3) 5000 UNITS CAPS Take one tablet by mouth daily     clopidogrel (PLAVIX) 75 MG tablet Take 1 tablet (75 mg total) by mouth daily. 90 tablet 2   clotrimazole-betamethasone (LOTRISONE) cream APPLY AS DIRECTED 45 g 11   Coenzyme Q10 (COQ10) 100 MG CAPS Take as directed 30 each 11   ezetimibe (ZETIA) 10 MG tablet Take 1 tablet (10 mg total) by mouth daily. 90 tablet 3   hydrALAZINE (APRESOLINE) 50 MG tablet TAKE 1 TABLET BY MOUTH 4 TIMES DAILY. 360 tablet 3   meclizine (ANTIVERT) 25 MG tablet Take 0.5 tablets (12.5 mg total) by mouth 3 (three) times daily as needed for dizziness. 30 tablet 0   metFORMIN (GLUCOPHAGE) 500 MG tablet TAKE 1 TABLET BY MOUTH DAILY WITH BREAKFAST. OVERDUE FOR ANNUAL APPT W/ LABS FOR FUTURE REFILLS 30 tablet 11   montelukast (SINGULAIR) 10 MG tablet TAKE 1 TABLET BY MOUTH EVERYDAY AT BEDTIME 90 tablet 3   nitroGLYCERIN (NITROSTAT) 0.4 MG SL tablet Place 1 tablet (0.4 mg total) under the tongue  every 5 (five) minutes as needed for chest pain. Up to 3 doses. 75 tablet 2   pravastatin (PRAVACHOL) 40 MG tablet TAKE 1 TABLET BY MOUTH EVERY DAY 90 tablet 3   predniSONE (DELTASONE) 5 MG tablet TAKE 1 TABLET BY MOUTH EVERY DAY WITH BREAKFAST 30 tablet 1   traMADol (ULTRAM) 50 MG tablet TAKE 1 TABLET BY MOUTH EVERY 6 HOURS AS NEEDED 120 tablet 1   vitamin C (ASCORBIC ACID) 500 MG tablet Take 500 mg by mouth daily.      tiZANidine (ZANAFLEX) 4 MG tablet TAKE 1 TABLET BY MOUTH EVERY 6 HOURS AS NEEDED FOR MUSCLE SPASMS. 20 tablet 3   DULoxetine (CYMBALTA) 30 MG capsule Take 1 capsule (30 mg total) by mouth daily. (Patient not taking: Reported on 10/01/2021) 30 capsule 5   No facility-administered medications prior to visit.    ROS: Review of Systems  Constitutional:  Negative for appetite change, fatigue and unexpected weight change.  HENT:  Negative for congestion, nosebleeds, sneezing, sore throat and trouble swallowing.   Eyes:  Negative for itching and visual disturbance.  Respiratory:  Negative for cough.   Cardiovascular:  Negative for chest pain, palpitations and leg swelling.  Gastrointestinal:  Negative for abdominal distention, blood in stool, diarrhea and nausea.  Genitourinary:  Negative for frequency and hematuria.  Musculoskeletal:  Positive for back pain and gait problem. Negative for joint swelling and neck pain.  Skin:  Negative for rash.  Neurological:  Negative for dizziness, tremors, speech difficulty and weakness.  Psychiatric/Behavioral:  Negative for agitation, dysphoric mood and sleep disturbance. The patient is not nervous/anxious.     Objective:  BP 120/68   Pulse (!) 55   Temp 98.6 F (37 C) (Oral)   Ht '5\' 9"'$  (1.753 m)   Wt 191 lb 8 oz (86.9 kg)   SpO2 94%   BMI 28.28 kg/m   BP Readings from Last 3 Encounters:  10/01/21 120/68  09/16/21 (!) 147/76  08/11/21 (!) 173/73    Wt Readings from Last 3 Encounters:  10/01/21 191 lb 8 oz (86.9 kg)  09/16/21  196 lb (88.9 kg)  08/11/21 195 lb (88.5 kg)    Physical Exam Constitutional:      General: He is not in acute distress.    Appearance: He is well-developed. He is obese.     Comments: NAD  Eyes:     Conjunctiva/sclera: Conjunctivae normal.     Pupils: Pupils are equal, round, and reactive to light.  Neck:     Thyroid: No thyromegaly.     Vascular: No JVD.  Cardiovascular:     Rate and Rhythm: Normal rate and regular rhythm.     Heart sounds: Normal heart sounds. No murmur heard.    No friction rub. No gallop.  Pulmonary:     Effort: Pulmonary effort is normal. No respiratory distress.     Breath sounds: Normal breath sounds. No wheezing or rales.  Chest:     Chest wall: No tenderness.  Abdominal:     General: Bowel sounds are normal. There is no distension.     Palpations: Abdomen is soft. There is no mass.     Tenderness: There is no abdominal tenderness. There is no guarding or rebound.  Musculoskeletal:        General: No tenderness. Normal range of motion.     Cervical back: Normal range of motion.  Lymphadenopathy:     Cervical: No cervical adenopathy.  Skin:    General: Skin is warm and dry.     Findings: No rash.  Neurological:     Mental Status: He is alert and oriented to person, place, and time.     Cranial Nerves: No cranial nerve deficit.     Motor: No abnormal muscle tone.     Coordination: Coordination normal.     Gait: Gait normal.     Deep Tendon Reflexes: Reflexes are normal and symmetric.  Psychiatric:        Behavior: Behavior normal.        Thought Content: Thought content normal.        Judgment: Judgment normal.     Lab Results  Component Value Date   WBC 9.9 12/26/2019   HGB 14.5 12/26/2019   HCT 43.0 12/26/2019   PLT 179 12/26/2019   GLUCOSE 87 02/17/2021   CHOL 144 06/03/2021   TRIG 64 06/03/2021   HDL 60 06/03/2021   LDLCALC 71 06/03/2021   ALT 22 06/03/2021   AST 20 06/03/2021   NA 140 02/17/2021   K 4.5 02/17/2021   CL 104  02/17/2021   CREATININE 1.34 02/17/2021   BUN 14 02/17/2021   CO2 31 02/17/2021   TSH 1.91 11/29/2018   PSA 0.41 04/22/2021   INR 1.1 12/04/2019   HGBA1C 5.8 02/17/2021  MR Lumbar Spine w/o contrast  Result Date: 12/06/2020 CLINICAL DATA:  Lumbar radiculopathy. EXAM: MRI LUMBAR SPINE WITHOUT CONTRAST TECHNIQUE: Multiplanar, multisequence MR imaging of the lumbar spine was performed. No intravenous contrast was administered. COMPARISON:  None. FINDINGS: Segmentation:  Standard. Alignment:  2 mm retrolisthesis of L2 on L3, L3 on L4 and L4 on L5. Vertebrae: No acute fracture, evidence of discitis, or aggressive bone lesion. Conus medullaris and cauda equina: Conus extends to the L1 level. Conus and cauda equina appear normal. Paraspinal and other soft tissues: No acute paraspinal abnormality. Disc levels: Disc spaces: Degenerative disease with disc height loss at T12-L1, L1-2, L2-3, L3-4 and L4-5. T12-L1: No significant disc bulge. No neural foraminal stenosis. No central canal stenosis. L1-L2: No disc protrusion. Mild left foraminal stenosis. No right foraminal stenosis. Mild bilateral facet arthropathy. L2-L3: Mild broad-based disc bulge. Moderate bilateral facet arthropathy. Mild spinal stenosis. Moderate left and mild right foraminal stenosis. L3-L4: Mild broad-based disc bulge. Mild bilateral facet arthropathy. Bilateral lateral recess stenosis. Mild right and moderate left foraminal stenosis. No central canal stenosis. L4-L5: Broad-based disc bulge. Moderate bilateral facet arthropathy. Moderate right foraminal stenosis. No left foraminal stenosis. No spinal stenosis. L5-S1: Mild broad-based disc bulge. Moderate bilateral facet arthropathy, right worse than left. Reactive marrow edema involving the right facet joint. Right L5 pars interarticularis defect. Mild right foraminal stenosis. No left foraminal stenosis. IMPRESSION: 1. Lumbar spine spondylosis as described above. 2. No acute osseous injury  of the lumbar spine. Electronically Signed   By: Kathreen Devoid M.D.   On: 12/06/2020 08:22   MR DPOEU RIGHT WO CONTRAST  Result Date: 12/05/2020 CLINICAL DATA:  Posterior right thigh pain for several months. Stress fracture suspected. EXAM: MRI OF THE RIGHT FEMUR WITHOUT CONTRAST TECHNIQUE: Multiplanar, multisequence MR imaging of the right femur was performed. No intravenous contrast was administered. COMPARISON:  X-ray 05/31/2017 FINDINGS: Bones/Joint/Cartilage No acute fracture. No dislocation. No significant bone marrow edema. No periostitis. No suspicious marrow replacing bone lesion. Small bilateral knee joint effusions are evident. Evaluation for internal derangement is limited on large field-of-view imaging. Ligaments Intact. Muscles and Tendons Complete tear of the hamstring component of the adductor magnus tendon origin from the ischial tuberosity (series 8, images 11-13; series 7, image 19) with approximately 2.4 cm retraction with fluid-filled gap. Tendinosis with low-grade partial tearing of the semimembranosus tendon as well as the conjoint tendon of the long head biceps femoris and semitendinosus without a full-thickness or retracted component. Intramuscular edema within the proximal hamstring compartment. Origins of the contralateral left hamstring tendons appear intact on limited view. Soft tissues No well-defined hematoma. No mass identified within the soft tissues. Small right and trace left hydroceles. IMPRESSION: 1. Complete tear of the hamstring component of the adductor magnus tendon origin from the ischial tuberosity with approximately 2.4 cm retraction with fluid-filled gap. 2. Tendinosis with low-grade partial tearing of the semimembranosus tendon as well as the conjoint tendon of the long head biceps femoris and semitendinosus without a full-thickness or retracted component. Electronically Signed   By: Davina Poke D.O.   On: 12/05/2020 15:59    Assessment & Plan:   Problem  List Items Addressed This Visit     Anxiety disorder    Cont on Wellbutrin Xanax prn  Potential benefits of a long term benzodiazepines  use as well as potential risks  and complications were explained to the patient and were aknowledged.      Chronic renal insufficiency, stage 3 (moderate) (HCC)  Cont with Coreg, Hydralazine Hydrate well      Relevant Orders   Comprehensive metabolic panel   Hemoglobin A1c   TSH   Hamstring tendon rupture    Chronic R leg pain - pulled hamstring muscle - on Tramadol 3/d - seeing Dr Erlinda Hong. MRI shows rupture of the proximal hamstring of the abductor magnus component. Pain is 9/10 and 3-5/10 on Tramadol.  TENS unit - d/c Rice sock heating pad Massage pad low dose Prednisone 5 mg/d is helping        History of CVA (cerebrovascular accident)    Cont on Plavix, Pravastatin      Relevant Orders   Comprehensive metabolic panel   Hemoglobin A1c   TSH   Hyperglycemia - Primary   Relevant Orders   Hemoglobin A1c      No orders of the defined types were placed in this encounter.     Follow-up: Return in about 3 months (around 01/01/2022) for a follow-up visit.  [name], MD

## 2021-10-01 NOTE — Assessment & Plan Note (Signed)
Reduce Prednisone to 5 mg qod and 2.5 mg qod

## 2021-10-01 NOTE — Assessment & Plan Note (Addendum)
Chronic R leg pain - pulled hamstring muscle - on Tramadol 3/d - seeing Dr Erlinda Hong. MRI shows rupture of the proximal hamstring of the abductor magnus component. Pain is 9/10 and 3-5/10 on Tramadol.  TENS unit - d/c Rice sock heating pad Massage pad low dose Prednisone 5 mg/d is helping: Reduce Prednisone to 5 mg qod and 2.5 mg qod

## 2021-10-01 NOTE — Assessment & Plan Note (Signed)
Cont with Coreg, Hydralazine Hydrate well 

## 2021-10-01 NOTE — Assessment & Plan Note (Signed)
Cont on Wellbutrin Xanax prn  Potential benefits of a long term benzodiazepines  use as well as potential risks  and complications were explained to the patient and were aknowledged.

## 2021-10-01 NOTE — Assessment & Plan Note (Signed)
Cont on Plavix, Pravastatin 

## 2021-10-16 ENCOUNTER — Other Ambulatory Visit: Payer: Self-pay | Admitting: Internal Medicine

## 2021-10-28 ENCOUNTER — Other Ambulatory Visit: Payer: Self-pay | Admitting: Internal Medicine

## 2021-11-25 ENCOUNTER — Other Ambulatory Visit: Payer: Self-pay | Admitting: Internal Medicine

## 2021-11-25 NOTE — Telephone Encounter (Signed)
Pt is requesting a rx for [facility] inhaler. Rx in not on current med list. Pt states he is in [place], [place] on vacation and he is having trouble breathing. Pt is requesting a call to let him know if the rx can be filled or not.   Please advise.

## 2021-11-25 NOTE — Telephone Encounter (Signed)
Patient called back and said this is the name of the other inhaler - albuterol.

## 2021-11-26 DIAGNOSIS — R059 Cough, unspecified: Secondary | ICD-10-CM | POA: Insufficient documentation

## 2021-11-26 DIAGNOSIS — R062 Wheezing: Secondary | ICD-10-CM | POA: Insufficient documentation

## 2021-11-26 DIAGNOSIS — R509 Fever, unspecified: Secondary | ICD-10-CM | POA: Insufficient documentation

## 2021-11-27 MED ORDER — MOMETASONE FURO-FORMOTEROL FUM 100-5 MCG/ACT IN AERO: 2.0000 | INHALATION_SPRAY | Freq: Two times a day (BID) | RESPIRATORY_TRACT | 3 refills | Status: DC

## 2021-11-27 NOTE — Telephone Encounter (Signed)
Please mail the prescription to the pharmacy.  [facility] or discharge.  The latter prescription is prepared.  Go to urgent care if problems.  Thanks

## 2021-11-27 NOTE — Telephone Encounter (Signed)
Called pt no answer LMOM md ok'd rx, but need pharmacy to call rx into in [place].Marland KitchenJohny Stone

## 2021-12-01 ENCOUNTER — Encounter: Payer: Self-pay | Admitting: Internal Medicine

## 2021-12-10 ENCOUNTER — Encounter: Payer: Self-pay | Admitting: Internal Medicine

## 2021-12-10 ENCOUNTER — Ambulatory Visit (INDEPENDENT_AMBULATORY_CARE_PROVIDER_SITE_OTHER): Payer: Medicare Other

## 2021-12-10 ENCOUNTER — Ambulatory Visit: Payer: Medicare Other | Admitting: Internal Medicine

## 2021-12-10 VITALS — BP 136/70 | HR 56 | Temp 98.0°F | Ht 69.0 in | Wt 196.2 lb

## 2021-12-10 DIAGNOSIS — Z8673 Personal history of transient ischemic attack (TIA), and cerebral infarction without residual deficits: Secondary | ICD-10-CM

## 2021-12-10 DIAGNOSIS — N320 Bladder-neck obstruction: Secondary | ICD-10-CM

## 2021-12-10 DIAGNOSIS — F09 Unspecified mental disorder due to known physiological condition: Secondary | ICD-10-CM | POA: Diagnosis not present

## 2021-12-10 DIAGNOSIS — J441 Chronic obstructive pulmonary disease with (acute) exacerbation: Secondary | ICD-10-CM

## 2021-12-10 DIAGNOSIS — U071 COVID-19: Secondary | ICD-10-CM | POA: Diagnosis not present

## 2021-12-10 DIAGNOSIS — Z0000 Encounter for general adult medical examination without abnormal findings: Secondary | ICD-10-CM

## 2021-12-10 DIAGNOSIS — R739 Hyperglycemia, unspecified: Secondary | ICD-10-CM

## 2021-12-10 MED ORDER — METHYLPREDNISOLONE 4 MG PO TBPK: ORAL_TABLET | ORAL | 0 refills | Status: DC

## 2021-12-10 MED ORDER — TRELEGY ELLIPTA 100-62.5-25 MCG/ACT IN AEPB: 1.0000 | INHALATION_SPRAY | Freq: Every day | RESPIRATORY_TRACT | 11 refills | Status: DC

## 2021-12-10 MED ORDER — IPRATROPIUM-ALBUTEROL 0.5-2.5 (3) MG/3ML IN SOLN: 3.0000 mL | RESPIRATORY_TRACT | 2 refills | Status: DC | PRN

## 2021-12-10 NOTE — Assessment & Plan Note (Signed)
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

## 2021-12-10 NOTE — Assessment & Plan Note (Signed)
Cont on Plavix, Pravastatin

## 2021-12-10 NOTE — Assessment & Plan Note (Signed)
Worse post-COVID Discussed

## 2021-12-10 NOTE — Assessment & Plan Note (Signed)
Worse post-COVID Medrol pack po Start Trelegy qd

## 2021-12-10 NOTE — Progress Notes (Signed)
Subjective:  Patient ID: Larry Stone, male    DOB: 1950-08-22  Age: 71 y.o. MRN: [id number]  CC: Follow-up (Pt states he was positive for covid 3 weeks ago. Still have the SOB. Would like rx for Ipratropium Bromide for his nebulizer)   HPI Larry Stone presents for COVID 3 weeks ago. C/o fatigue, SOB, h/o CVA  Outpatient Medications Prior to Visit  Medication Sig Dispense Refill   ALPRAZolam (XANAX) 0.5 MG tablet TAKE 1 TABLET BY MOUTH THREE TIMES A DAY AS NEEDED 270 tablet 0   buPROPion (WELLBUTRIN SR) 150 MG 12 hr tablet TAKE 1 TABLET BY MOUTH TWICE A DAY 180 tablet 3   carvedilol (COREG) 25 MG tablet Take 1 tablet (25 mg total) by mouth 2 (two) times daily with a meal. 180 tablet 3   clopidogrel (PLAVIX) 75 MG tablet Take 1 tablet (75 mg total) by mouth daily. 90 tablet 2   clotrimazole-betamethasone (LOTRISONE) cream APPLY AS DIRECTED 45 g 11   ezetimibe (ZETIA) 10 MG tablet Take 1 tablet (10 mg total) by mouth daily. 90 tablet 3   hydrALAZINE (APRESOLINE) 50 MG tablet TAKE 1 TABLET BY MOUTH 4 TIMES DAILY. 360 tablet 3   meclizine (ANTIVERT) 25 MG tablet Take 0.5 tablets (12.5 mg total) by mouth 3 (three) times daily as needed for dizziness. 30 tablet 0   metFORMIN (GLUCOPHAGE) 500 MG tablet TAKE 1 TABLET BY MOUTH DAILY WITH BREAKFAST. OVERDUE FOR ANNUAL APPT W/ LABS FOR FUTURE REFILLS 30 tablet 11   montelukast (SINGULAIR) 10 MG tablet TAKE 1 TABLET BY MOUTH EVERYDAY AT BEDTIME 90 tablet 3   nitroGLYCERIN (NITROSTAT) 0.4 MG SL tablet Place 1 tablet (0.4 mg total) under the tongue every 5 (five) minutes as needed for chest pain. Up to 3 doses. 75 tablet 2   pravastatin (PRAVACHOL) 40 MG tablet TAKE 1 TABLET BY MOUTH EVERY DAY 90 tablet 3   traMADol (ULTRAM) 50 MG tablet TAKE 1 TABLET BY MOUTH EVERY 6 HOURS AS NEEDED 120 tablet 1   ipratropium-albuterol (DUONEB) 0.5-2.5 (3) MG/3ML SOLN Take by nebulization. Take by nebulization every 4-6 hours as needed     predniSONE (DELTASONE)  5 MG tablet TAKE 1 TABLET BY MOUTH EVERY DAY WITH BREAKFAST 30 tablet 1   albuterol (PROVENTIL) (2.5 MG/3ML) 0.083% nebulizer solution Use one vial in the nebulizer every 4-6 hours if needed for cough or wheeze (Patient not taking: Reported on 12/10/2021) 225 mL 11   ammonium lactate (LAC-HYDRIN) 12 % lotion APPLY TOPICALLY AS DIRECTED. (Patient not taking: Reported on 12/10/2021) 400 mL 3   Cholecalciferol (VITAMIN D3) 5000 UNITS CAPS Take one tablet by mouth daily (Patient not taking: Reported on 12/10/2021)     Coenzyme Q10 (COQ10) 100 MG CAPS Take as directed (Patient not taking: Reported on 12/10/2021) 30 each 11   mometasone-formoterol (DULERA) 100-5 MCG/ACT AERO Inhale 2 puffs into the lungs 2 (two) times daily. (Patient not taking: Reported on 12/10/2021) 13 g 3   vitamin C (ASCORBIC ACID) 500 MG tablet Take 500 mg by mouth daily.  (Patient not taking: Reported on 12/10/2021)     No facility-administered medications prior to visit.    ROS: Review of Systems  Constitutional:  Positive for fatigue. Negative for appetite change and unexpected weight change.  HENT:  Negative for congestion, nosebleeds, sneezing, sore throat and trouble swallowing.   Eyes:  Negative for itching and visual disturbance.  Respiratory:  Positive for cough, shortness of breath and wheezing.  Cardiovascular:  Negative for chest pain, palpitations and leg swelling.  Gastrointestinal:  Negative for abdominal distention, blood in stool, diarrhea and nausea.  Genitourinary:  Negative for frequency and hematuria.  Musculoskeletal:  Positive for arthralgias and gait problem. Negative for back pain, joint swelling and neck pain.  Skin:  Negative for rash.  Neurological:  Negative for dizziness, tremors, speech difficulty and weakness.  Hematological:  Does not bruise/bleed easily.  Psychiatric/Behavioral:  Positive for decreased concentration. Negative for agitation, dysphoric mood, sleep disturbance and suicidal ideas. The  patient is not nervous/anxious.     Objective:  BP 136/70 (BP Location: Left Arm)   Pulse (!) 56   Temp 98 F (36.7 C) (Oral)   Ht '5\' 9"'$  (1.753 m)   Wt 196 lb 3.2 oz (89 kg)   SpO2 97%   BMI 28.97 kg/m   BP Readings from Last 3 Encounters:  12/10/21 136/70  10/01/21 120/68  09/16/21 (!) 147/76    Wt Readings from Last 3 Encounters:  12/10/21 196 lb 3.2 oz (89 kg)  10/01/21 191 lb 8 oz (86.9 kg)  09/16/21 196 lb (88.9 kg)    Physical Exam Constitutional:      General: He is not in acute distress.    Appearance: He is well-developed. He is obese.     Comments: NAD  Eyes:     Conjunctiva/sclera: Conjunctivae normal.     Pupils: Pupils are equal, round, and reactive to light.  Neck:     Thyroid: No thyromegaly.     Vascular: No JVD.  Cardiovascular:     Rate and Rhythm: Normal rate and regular rhythm.     Heart sounds: Normal heart sounds. No murmur heard.    No friction rub. No gallop.  Pulmonary:     Effort: Pulmonary effort is normal. No respiratory distress.     Breath sounds: Normal breath sounds. No wheezing or rales.  Chest:     Chest wall: No tenderness.  Abdominal:     General: Bowel sounds are normal. There is no distension.     Palpations: Abdomen is soft. There is no mass.     Tenderness: There is no abdominal tenderness. There is no guarding or rebound.  Musculoskeletal:        General: No tenderness. Normal range of motion.     Cervical back: Normal range of motion.  Lymphadenopathy:     Cervical: No cervical adenopathy.  Skin:    General: Skin is warm and dry.     Findings: No rash.  Neurological:     Mental Status: He is alert and oriented to person, place, and time.     Cranial Nerves: No cranial nerve deficit.     Motor: No abnormal muscle tone.     Coordination: Coordination normal.     Gait: Gait normal.     Deep Tendon Reflexes: Reflexes are normal and symmetric.  Psychiatric:        Behavior: Behavior normal.        Thought Content:  Thought content normal.        Judgment: Judgment normal.   I personally provided Trelegy inhaler use teaching. After teaching the patient should be able to use effectively. All questions were answered  Lab Results  Component Value Date   WBC 9.9 12/26/2019   HGB 14.5 12/26/2019   HCT 43.0 12/26/2019   PLT 179 12/26/2019   GLUCOSE 87 02/17/2021   CHOL 144 06/03/2021   TRIG 64 06/03/2021  HDL 60 06/03/2021   LDLCALC 71 06/03/2021   ALT 22 06/03/2021   AST 20 06/03/2021   NA 140 02/17/2021   K 4.5 02/17/2021   CL 104 02/17/2021   CREATININE 1.34 02/17/2021   BUN 14 02/17/2021   CO2 31 02/17/2021   TSH 1.91 11/29/2018   PSA 0.41 04/22/2021   INR 1.1 12/04/2019   HGBA1C 5.8 02/17/2021    MR Lumbar Spine w/o contrast  Result Date: 12/06/2020 CLINICAL DATA:  Lumbar radiculopathy. EXAM: MRI LUMBAR SPINE WITHOUT CONTRAST TECHNIQUE: Multiplanar, multisequence MR imaging of the lumbar spine was performed. No intravenous contrast was administered. COMPARISON:  None. FINDINGS: Segmentation:  Standard. Alignment:  2 mm retrolisthesis of L2 on L3, L3 on L4 and L4 on L5. Vertebrae: No acute fracture, evidence of discitis, or aggressive bone lesion. Conus medullaris and cauda equina: Conus extends to the L1 level. Conus and cauda equina appear normal. Paraspinal and other soft tissues: No acute paraspinal abnormality. Disc levels: Disc spaces: Degenerative disease with disc height loss at T12-L1, L1-2, L2-3, L3-4 and L4-5. T12-L1: No significant disc bulge. No neural foraminal stenosis. No central canal stenosis. L1-L2: No disc protrusion. Mild left foraminal stenosis. No right foraminal stenosis. Mild bilateral facet arthropathy. L2-L3: Mild broad-based disc bulge. Moderate bilateral facet arthropathy. Mild spinal stenosis. Moderate left and mild right foraminal stenosis. L3-L4: Mild broad-based disc bulge. Mild bilateral facet arthropathy. Bilateral lateral recess stenosis. Mild right and moderate  left foraminal stenosis. No central canal stenosis. L4-L5: Broad-based disc bulge. Moderate bilateral facet arthropathy. Moderate right foraminal stenosis. No left foraminal stenosis. No spinal stenosis. L5-S1: Mild broad-based disc bulge. Moderate bilateral facet arthropathy, right worse than left. Reactive marrow edema involving the right facet joint. Right L5 pars interarticularis defect. Mild right foraminal stenosis. No left foraminal stenosis. IMPRESSION: 1. Lumbar spine spondylosis as described above. 2. No acute osseous injury of the lumbar spine. Electronically Signed   By: Kathreen Devoid M.D.   On: 12/06/2020 08:22   MR [id number] RIGHT WO CONTRAST  Result Date: 12/05/2020 CLINICAL DATA:  Posterior right thigh pain for several months. Stress fracture suspected. EXAM: MRI OF THE RIGHT FEMUR WITHOUT CONTRAST TECHNIQUE: Multiplanar, multisequence MR imaging of the right femur was performed. No intravenous contrast was administered. COMPARISON:  X-ray 05/31/2017 FINDINGS: Bones/Joint/Cartilage No acute fracture. No dislocation. No significant bone marrow edema. No periostitis. No suspicious marrow replacing bone lesion. Small bilateral knee joint effusions are evident. Evaluation for internal derangement is limited on large field-of-view imaging. Ligaments Intact. Muscles and Tendons Complete tear of the hamstring component of the adductor magnus tendon origin from the ischial tuberosity (series 8, images 11-13; series 7, image 19) with approximately 2.4 cm retraction with fluid-filled gap. Tendinosis with low-grade partial tearing of the semimembranosus tendon as well as the conjoint tendon of the long head biceps femoris and semitendinosus without a full-thickness or retracted component. Intramuscular edema within the proximal hamstring compartment. Origins of the contralateral left hamstring tendons appear intact on limited view. Soft tissues No well-defined hematoma. No mass identified within the soft  tissues. Small right and trace left hydroceles. IMPRESSION: 1. Complete tear of the hamstring component of the adductor magnus tendon origin from the ischial tuberosity with approximately 2.4 cm retraction with fluid-filled gap. 2. Tendinosis with low-grade partial tearing of the semimembranosus tendon as well as the conjoint tendon of the long head biceps femoris and semitendinosus without a full-thickness or retracted component. Electronically Signed   By: Davina Poke D.O.  On: 12/05/2020 15:59    Assessment & Plan:   Problem List Items Addressed This Visit     COPD with acute exacerbation ([place]) - Primary    Worse post-COVID Medrol pack po Start Trelegy qd      Relevant Medications   ipratropium-albuterol (DUONEB) 0.5-2.5 (3) MG/3ML SOLN   Fluticasone-Umeclidin-Vilant (TRELEGY ELLIPTA) 100-62.5-25 MCG/ACT AEPB   methylPREDNISolone (MEDROL DOSEPAK) 4 MG TBPK tablet   Other Relevant Orders   DG Chest 2 View   COVID-19    1st time Post-COVID fatigue, brain fog      Relevant Orders   DG Chest 2 View   History of CVA (cerebrovascular accident)    Cont on Plavix, Pravastatin      Hyperglycemia   Relevant Orders   Hemoglobin A1c   Mild cognitive disorder    Worse post-COVID Discussed      Well adult exam   Relevant Orders   TSH   Urinalysis   CBC with Differential/Platelet   Lipid panel   Comprehensive metabolic panel   PSA   Other Visit Diagnoses     Bladder neck obstruction       Relevant Orders   PSA         Meds ordered this encounter  Medications   ipratropium-albuterol (DUONEB) 0.5-2.5 (3) MG/3ML SOLN    Sig: Take 3 mLs by nebulization every 4 (four) hours as needed (every 4-6 hours prn). Take by nebulization every 4-6 hours as needed    Dispense:  360 mL    Refill:  2   Fluticasone-Umeclidin-Vilant (TRELEGY ELLIPTA) 100-62.5-25 MCG/ACT AEPB    Sig: Inhale 1 puff into the lungs daily.    Dispense:  1 each    Refill:  11   methylPREDNISolone  (MEDROL DOSEPAK) 4 MG TBPK tablet    Sig: As directed    Dispense:  21 tablet    Refill:  0      Follow-up: Return for Wellness Exam.  [name], MD

## 2021-12-14 ENCOUNTER — Other Ambulatory Visit: Payer: Self-pay | Admitting: Internal Medicine

## 2021-12-15 NOTE — Telephone Encounter (Signed)
Okay.  Thanks.

## 2021-12-21 ENCOUNTER — Other Ambulatory Visit: Payer: Self-pay | Admitting: Internal Medicine

## 2022-01-03 ENCOUNTER — Other Ambulatory Visit: Payer: Self-pay | Admitting: Internal Medicine

## 2022-01-04 ENCOUNTER — Ambulatory Visit: Payer: Medicare Other | Admitting: Internal Medicine

## 2022-01-11 ENCOUNTER — Other Ambulatory Visit (INDEPENDENT_AMBULATORY_CARE_PROVIDER_SITE_OTHER): Payer: Medicare Other

## 2022-01-11 DIAGNOSIS — N320 Bladder-neck obstruction: Secondary | ICD-10-CM | POA: Diagnosis not present

## 2022-01-11 DIAGNOSIS — Z0000 Encounter for general adult medical examination without abnormal findings: Secondary | ICD-10-CM

## 2022-01-11 DIAGNOSIS — R739 Hyperglycemia, unspecified: Secondary | ICD-10-CM

## 2022-01-11 LAB — COMPREHENSIVE METABOLIC PANEL
ALT: 15 U/L (ref 0–53)
AST: 16 U/L (ref 0–37)
Albumin: 3.8 g/dL (ref 3.5–5.2)
Alkaline Phosphatase: 68 U/L (ref 39–117)
BUN: 19 mg/dL (ref 6–23)
CO2: 27 mEq/L (ref 19–32)
Calcium: 8.7 mg/dL (ref 8.4–10.5)
Chloride: 104 mEq/L (ref 96–112)
Creatinine, Ser: 1.29 mg/dL (ref 0.40–1.50)
GFR: 55.73 mL/min — ABNORMAL LOW (ref >60.00)
Glucose, Bld: 118 mg/dL — ABNORMAL HIGH (ref 70–99)
Potassium: 4.2 mEq/L (ref 3.5–5.1)
Sodium: 140 mEq/L (ref 135–145)
Total Bilirubin: 0.6 mg/dL (ref 0.2–1.2)
Total Protein: 6.2 g/dL (ref 6.0–8.3)

## 2022-01-11 LAB — CBC WITH DIFFERENTIAL/PLATELET
Basophils Absolute: 0.1 10*3/uL (ref 0.0–0.1)
Basophils Relative: 0.8 % (ref 0.0–3.0)
Eosinophils Absolute: 0.3 10*3/uL (ref 0.0–0.7)
Eosinophils Relative: 4.1 % (ref 0.0–5.0)
HCT: 39.3 % (ref 39.0–52.0)
Hemoglobin: 13.3 g/dL (ref 13.0–17.0)
Lymphocytes Relative: 19.4 % (ref 12.0–46.0)
Lymphs Abs: 1.5 10*3/uL (ref 0.7–4.0)
MCHC: 33.7 g/dL (ref 30.0–36.0)
MCV: 94.5 fl (ref 78.0–100.0)
Monocytes Absolute: 0.8 10*3/uL (ref 0.1–1.0)
Monocytes Relative: 9.6 % (ref 3.0–12.0)
Neutro Abs: 5.2 10*3/uL (ref 1.4–7.7)
Neutrophils Relative %: 66.1 % (ref 43.0–77.0)
Platelets: 148 10*3/uL — ABNORMAL LOW (ref 150.0–400.0)
RBC: 4.16 Mil/uL — ABNORMAL LOW (ref 4.22–5.81)
RDW: 13.4 % (ref 11.5–15.5)
WBC: 7.8 10*3/uL (ref 4.0–10.5)

## 2022-01-11 LAB — URINALYSIS
Bilirubin Urine: NEGATIVE
Hgb urine dipstick: NEGATIVE
Leukocytes,Ua: NEGATIVE
Nitrite: NEGATIVE
Specific Gravity, Urine: >=1.03 — AB (ref 1.000–1.030)
Total Protein, Urine: NEGATIVE
Urine Glucose: NEGATIVE
Urobilinogen, UA: 0.2 (ref 0.0–1.0)
pH: 5.5 (ref 5.0–8.0)

## 2022-01-11 LAB — LIPID PANEL
Cholesterol: 132 mg/dL (ref 0–200)
HDL: 47.9 mg/dL (ref >39.00)
LDL Cholesterol: 67 mg/dL (ref 0–99)
NonHDL: 84.48
Total CHOL/HDL Ratio: 3
Triglycerides: 86 mg/dL (ref 0.0–149.0)
VLDL: 17.2 mg/dL (ref 0.0–40.0)

## 2022-01-11 LAB — PSA: PSA: 0.33 ng/mL (ref 0.10–4.00)

## 2022-01-11 LAB — TSH: TSH: 2.27 u[IU]/mL (ref 0.35–5.50)

## 2022-01-12 ENCOUNTER — Ambulatory Visit: Payer: Medicare Other | Admitting: Internal Medicine

## 2022-01-12 ENCOUNTER — Encounter: Payer: Self-pay | Admitting: Internal Medicine

## 2022-01-12 VITALS — BP 128/82 | HR 65 | Temp 98.1°F | Ht 69.0 in | Wt 196.0 lb

## 2022-01-12 DIAGNOSIS — Z8673 Personal history of transient ischemic attack (TIA), and cerebral infarction without residual deficits: Secondary | ICD-10-CM

## 2022-01-12 DIAGNOSIS — I2510 Atherosclerotic heart disease of native coronary artery without angina pectoris: Secondary | ICD-10-CM

## 2022-01-12 DIAGNOSIS — U071 COVID-19: Secondary | ICD-10-CM

## 2022-01-12 DIAGNOSIS — J441 Chronic obstructive pulmonary disease with (acute) exacerbation: Secondary | ICD-10-CM | POA: Diagnosis not present

## 2022-01-12 DIAGNOSIS — R739 Hyperglycemia, unspecified: Secondary | ICD-10-CM

## 2022-01-12 DIAGNOSIS — N183 Chronic kidney disease, stage 3 unspecified: Secondary | ICD-10-CM

## 2022-01-12 DIAGNOSIS — I10 Essential (primary) hypertension: Secondary | ICD-10-CM

## 2022-01-12 LAB — HEMOGLOBIN A1C: Hgb A1c MFr Bld: 5.6 % (ref 4.6–6.5)

## 2022-01-12 NOTE — Assessment & Plan Note (Signed)
On Pravastatin to 40 mg/d

## 2022-01-12 NOTE — Assessment & Plan Note (Signed)
Rx for a chair lift

## 2022-01-12 NOTE — Assessment & Plan Note (Signed)
F/u w/Dr Carolin Sicks Cont with Coreg, Hydralazine Hydrate well

## 2022-01-12 NOTE — Progress Notes (Signed)
Subjective:  Patient ID: Larry Stone, male    DOB: 09-16-1950  Age: 71 y.o. MRN: [id number]  CC: Follow-up (3 month f/u)   HPI Larry Stone presents for post-COVID SOB - using Trelegy, fatigue, brain fog. F/u on anxiety, depression, CVA  Outpatient Medications Prior to Visit  Medication Sig Dispense Refill   ALPRAZolam (XANAX) 0.5 MG tablet TAKE 1 TABLET BY MOUTH THREE TIMES A DAY AS NEEDED 270 tablet 0   buPROPion (WELLBUTRIN SR) 150 MG 12 hr tablet TAKE 1 TABLET BY MOUTH TWICE A DAY 180 tablet 3   carvedilol (COREG) 25 MG tablet Take 1 tablet (25 mg total) by mouth 2 (two) times daily with a meal. 180 tablet 3   clopidogrel (PLAVIX) 75 MG tablet Take 1 tablet (75 mg total) by mouth daily. 90 tablet 2   clotrimazole-betamethasone (LOTRISONE) cream APPLY AS DIRECTED 45 g 11   ezetimibe (ZETIA) 10 MG tablet Take 1 tablet (10 mg total) by mouth daily. 90 tablet 3   Fluticasone-Umeclidin-Vilant (TRELEGY ELLIPTA) 100-62.5-25 MCG/ACT AEPB Inhale 1 puff into the lungs daily. 1 each 11   hydrALAZINE (APRESOLINE) 50 MG tablet TAKE 1 TABLET BY MOUTH 4 TIMES DAILY. 360 tablet 3   ipratropium-albuterol (DUONEB) 0.5-2.5 (3) MG/3ML SOLN Take 3 mLs by nebulization every 4 (four) hours as needed (every 4-6 hours prn). Take by nebulization every 4-6 hours as needed 360 mL 2   meclizine (ANTIVERT) 25 MG tablet Take 0.5 tablets (12.5 mg total) by mouth 3 (three) times daily as needed for dizziness. 30 tablet 0   metFORMIN (GLUCOPHAGE) 500 MG tablet TAKE 1 TABLET BY MOUTH DAILY WITH BREAKFAST. OVERDUE FOR ANNUAL APPT W/ LABS FOR FUTURE REFILLS 30 tablet 11   montelukast (SINGULAIR) 10 MG tablet TAKE 1 TABLET BY MOUTH EVERYDAY AT BEDTIME 90 tablet 3   nitroGLYCERIN (NITROSTAT) 0.4 MG SL tablet Place 1 tablet (0.4 mg total) under the tongue every 5 (five) minutes as needed for chest pain. Up to 3 doses. 75 tablet 2   pravastatin (PRAVACHOL) 40 MG tablet TAKE 1 TABLET BY MOUTH EVERY DAY 90 tablet 3    traMADol (ULTRAM) 50 MG tablet TAKE 1 TABLET BY MOUTH EVERY 6 HOURS AS NEEDED 120 tablet 1   methylPREDNISolone (MEDROL DOSEPAK) 4 MG TBPK tablet As directed 21 tablet 0   No facility-administered medications prior to visit.    ROS: Review of Systems  Constitutional:  Positive for fatigue. Negative for appetite change and unexpected weight change.  HENT:  Negative for congestion, nosebleeds, sneezing, sore throat and trouble swallowing.   Eyes:  Negative for itching and visual disturbance.  Respiratory:  Positive for shortness of breath and wheezing. Negative for cough.   Cardiovascular:  Negative for chest pain, palpitations and leg swelling.  Gastrointestinal:  Negative for abdominal distention, blood in stool, diarrhea and nausea.  Genitourinary:  Negative for frequency and hematuria.  Musculoskeletal:  Negative for back pain, gait problem, joint swelling and neck pain.  Skin:  Negative for rash.  Neurological:  Negative for dizziness, tremors, speech difficulty and weakness.  Psychiatric/Behavioral:  Positive for decreased concentration. Negative for agitation, dysphoric mood, sleep disturbance and suicidal ideas. The patient is nervous/anxious.     Objective:  BP 128/82 (BP Location: Left Arm)   Pulse 65   Temp 98.1 F (36.7 C) (Oral)   Ht '5\' 9"'$  (1.753 m)   Wt 196 lb (88.9 kg)   SpO2 94%   BMI 28.94 kg/m   BP  Readings from Last 3 Encounters:  01/12/22 128/82  12/10/21 136/70  10/01/21 120/68    Wt Readings from Last 3 Encounters:  01/12/22 196 lb (88.9 kg)  12/10/21 196 lb 3.2 oz (89 kg)  10/01/21 191 lb 8 oz (86.9 kg)    Physical Exam Constitutional:      General: He is not in acute distress.    Appearance: He is well-developed. He is obese.     Comments: NAD  Eyes:     Conjunctiva/sclera: Conjunctivae normal.     Pupils: Pupils are equal, round, and reactive to light.  Neck:     Thyroid: No thyromegaly.     Vascular: No JVD.  Cardiovascular:     Rate and  Rhythm: Normal rate and regular rhythm.     Heart sounds: Normal heart sounds. No murmur heard.    No friction rub. No gallop.  Pulmonary:     Effort: Pulmonary effort is normal. No respiratory distress.     Breath sounds: Normal breath sounds. No wheezing or rales.  Chest:     Chest wall: No tenderness.  Abdominal:     General: Bowel sounds are normal. There is no distension.     Palpations: Abdomen is soft. There is no mass.     Tenderness: There is no abdominal tenderness. There is no guarding or rebound.  Musculoskeletal:        General: No tenderness. Normal range of motion.     Cervical back: Normal range of motion.  Lymphadenopathy:     Cervical: No cervical adenopathy.  Skin:    General: Skin is warm and dry.     Findings: No rash.  Neurological:     Mental Status: He is alert and oriented to person, place, and time.     Cranial Nerves: No cranial nerve deficit.     Motor: No abnormal muscle tone.     Coordination: Coordination normal.     Gait: Gait normal.     Deep Tendon Reflexes: Reflexes are normal and symmetric.  Psychiatric:        Behavior: Behavior normal.        Thought Content: Thought content normal.        Judgment: Judgment normal.     Lab Results  Component Value Date   WBC 7.8 01/11/2022   HGB 13.3 01/11/2022   HCT 39.3 01/11/2022   PLT 148.0 (L) 01/11/2022   GLUCOSE 118 (H) 01/11/2022   CHOL 132 01/11/2022   TRIG 86.0 01/11/2022   HDL 47.90 01/11/2022   LDLCALC 67 01/11/2022   ALT 15 01/11/2022   AST 16 01/11/2022   NA 140 01/11/2022   K 4.2 01/11/2022   CL 104 01/11/2022   CREATININE 1.29 01/11/2022   BUN 19 01/11/2022   CO2 27 01/11/2022   TSH 2.27 01/11/2022   PSA 0.33 01/11/2022   INR 1.1 12/04/2019   HGBA1C 5.6 01/11/2022    MR Lumbar Spine w/o contrast  Result Date: 12/06/2020 CLINICAL DATA:  Lumbar radiculopathy. EXAM: MRI LUMBAR SPINE WITHOUT CONTRAST TECHNIQUE: Multiplanar, multisequence MR imaging of the lumbar spine was  performed. No intravenous contrast was administered. COMPARISON:  None. FINDINGS: Segmentation:  Standard. Alignment:  2 mm retrolisthesis of L2 on L3, L3 on L4 and L4 on L5. Vertebrae: No acute fracture, evidence of discitis, or aggressive bone lesion. Conus medullaris and cauda equina: Conus extends to the L1 level. Conus and cauda equina appear normal. Paraspinal and other soft tissues: No acute paraspinal abnormality.  Disc levels: Disc spaces: Degenerative disease with disc height loss at T12-L1, L1-2, L2-3, L3-4 and L4-5. T12-L1: No significant disc bulge. No neural foraminal stenosis. No central canal stenosis. L1-L2: No disc protrusion. Mild left foraminal stenosis. No right foraminal stenosis. Mild bilateral facet arthropathy. L2-L3: Mild broad-based disc bulge. Moderate bilateral facet arthropathy. Mild spinal stenosis. Moderate left and mild right foraminal stenosis. L3-L4: Mild broad-based disc bulge. Mild bilateral facet arthropathy. Bilateral lateral recess stenosis. Mild right and moderate left foraminal stenosis. No central canal stenosis. L4-L5: Broad-based disc bulge. Moderate bilateral facet arthropathy. Moderate right foraminal stenosis. No left foraminal stenosis. No spinal stenosis. L5-S1: Mild broad-based disc bulge. Moderate bilateral facet arthropathy, right worse than left. Reactive marrow edema involving the right facet joint. Right L5 pars interarticularis defect. Mild right foraminal stenosis. No left foraminal stenosis. IMPRESSION: 1. Lumbar spine spondylosis as described above. 2. No acute osseous injury of the lumbar spine. Electronically Signed   By: Kathreen Devoid M.D.   On: 12/06/2020 08:22   MR ASNKN RIGHT WO CONTRAST  Result Date: 12/05/2020 CLINICAL DATA:  Posterior right thigh pain for several months. Stress fracture suspected. EXAM: MRI OF THE RIGHT FEMUR WITHOUT CONTRAST TECHNIQUE: Multiplanar, multisequence MR imaging of the right femur was performed. No intravenous  contrast was administered. COMPARISON:  X-ray 05/31/2017 FINDINGS: Bones/Joint/Cartilage No acute fracture. No dislocation. No significant bone marrow edema. No periostitis. No suspicious marrow replacing bone lesion. Small bilateral knee joint effusions are evident. Evaluation for internal derangement is limited on large field-of-view imaging. Ligaments Intact. Muscles and Tendons Complete tear of the hamstring component of the adductor magnus tendon origin from the ischial tuberosity (series 8, images 11-13; series 7, image 19) with approximately 2.4 cm retraction with fluid-filled gap. Tendinosis with low-grade partial tearing of the semimembranosus tendon as well as the conjoint tendon of the long head biceps femoris and semitendinosus without a full-thickness or retracted component. Intramuscular edema within the proximal hamstring compartment. Origins of the contralateral left hamstring tendons appear intact on limited view. Soft tissues No well-defined hematoma. No mass identified within the soft tissues. Small right and trace left hydroceles. IMPRESSION: 1. Complete tear of the hamstring component of the adductor magnus tendon origin from the ischial tuberosity with approximately 2.4 cm retraction with fluid-filled gap. 2. Tendinosis with low-grade partial tearing of the semimembranosus tendon as well as the conjoint tendon of the long head biceps femoris and semitendinosus without a full-thickness or retracted component. Electronically Signed   By: Davina Poke D.O.   On: 12/05/2020 15:59    Assessment & Plan:   Problem List Items Addressed This Visit     CAD (coronary artery disease)     On Pravastatin to 40 mg/d      Chronic renal insufficiency, stage 3 (moderate) (HCC)    F/u w/Dr Carolin Sicks Cont with Coreg, Hydralazine Hydrate well      COPD with acute exacerbation ([facility])    Post-COVID SOB. Cont on Trelegy qd      COVID-19    Post-COVID fatigue, brain fog, SOB. Discussed Look at  low dose Naltrexone for long COVID Look at [facility] for [place] hypertension    F/u w/Dr [place] with Coreg, Hydralazine Hydrate well      History of CVA (cerebrovascular accident)    Rx for a chair lift         No orders of the defined types were placed in this encounter.  Follow-up: No follow-ups on file.  [name], MD

## 2022-01-12 NOTE — Assessment & Plan Note (Addendum)
Post-COVID fatigue, brain fog, SOB. Discussed Look at low dose Naltrexone for long COVID Look at [facility] for [place]

## 2022-01-12 NOTE — Patient Instructions (Signed)
Look at low dose Naltrexone for long COVID  Look at [facility] for [place]

## 2022-01-12 NOTE — Assessment & Plan Note (Signed)
Post-COVID SOB. Cont on Trelegy qd

## 2022-01-22 ENCOUNTER — Other Ambulatory Visit: Payer: Self-pay | Admitting: Internal Medicine

## 2022-01-26 ENCOUNTER — Telehealth: Payer: Self-pay | Admitting: Internal Medicine

## 2022-01-26 DIAGNOSIS — E785 Hyperlipidemia, unspecified: Secondary | ICD-10-CM

## 2022-01-26 MED ORDER — METFORMIN HCL 500 MG PO TABS: ORAL_TABLET | ORAL | 1 refills | Status: DC

## 2022-01-26 NOTE — Telephone Encounter (Signed)
Sent med to [facility]...Larry Stone

## 2022-01-26 NOTE — Telephone Encounter (Signed)
Patient needs his metformin refilled but would like to do a 90 day refill instead of 30.  Please send to [place]

## 2022-02-08 ENCOUNTER — Other Ambulatory Visit: Payer: Self-pay | Admitting: Internal Medicine

## 2022-02-23 IMAGING — CT CT HEAD W/O CM
3 of 4 series · 16 of 47 positions shown, 19 images · non-contrast
Comparison: None.

CLINICAL DATA: Acute neuro deficit.  Right-sided numbness

EXAM:
CT HEAD WITHOUT CONTRAST
TECHNIQUE: Contiguous axial images were obtained from the base of the skull
through the vertex without intravenous contrast.

[Series 2: head 5.0 hc40 · axial · 0.40mm/px · z∈[+96,+221]mm · 10 of 31 slices shown, 13 images]
[im 3/31  brain]
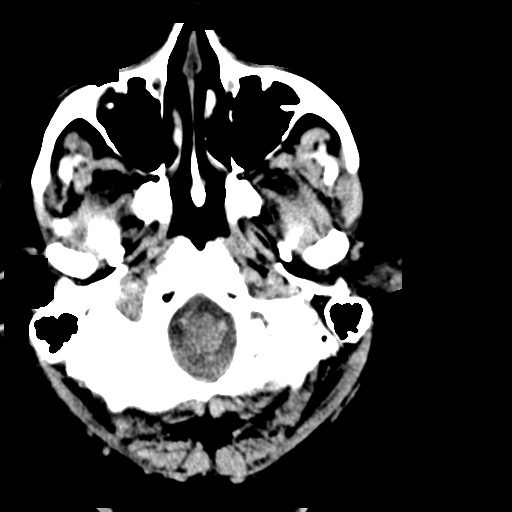
[im 3/31  bone]
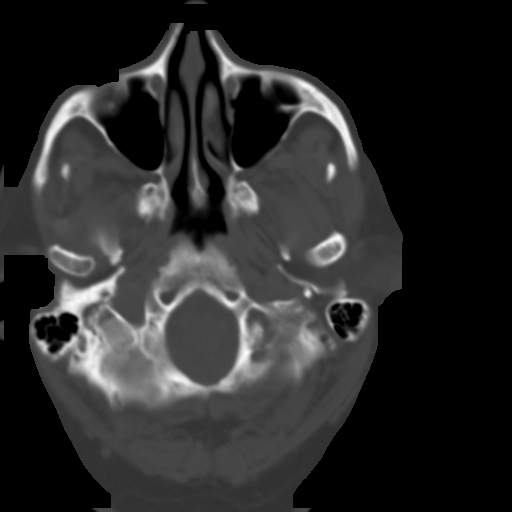
[im 5/31  brain]
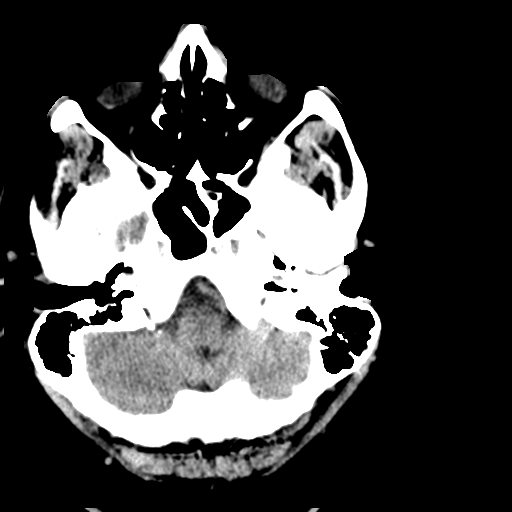
[im 9/31  brain]
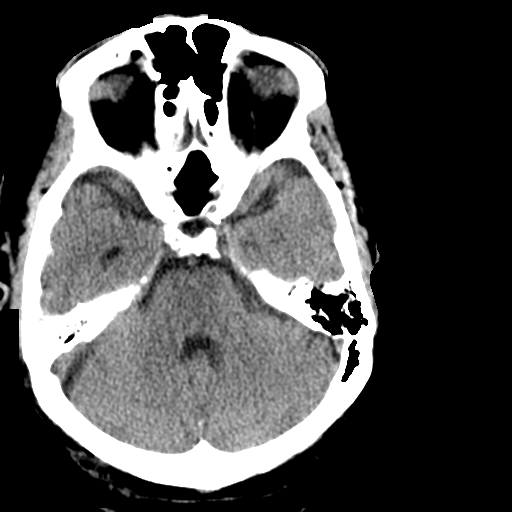
[im 11/31  brain]
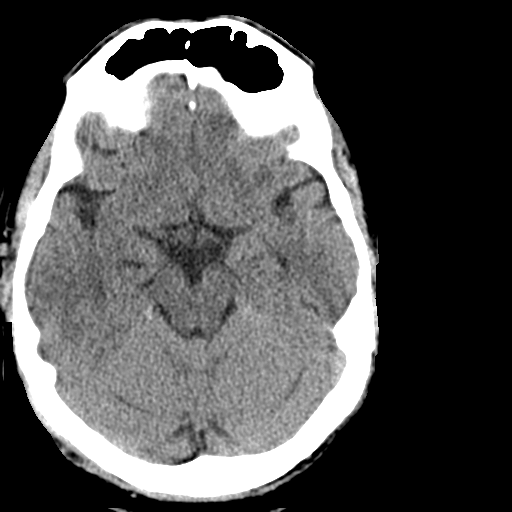
[im 13/31  brain]
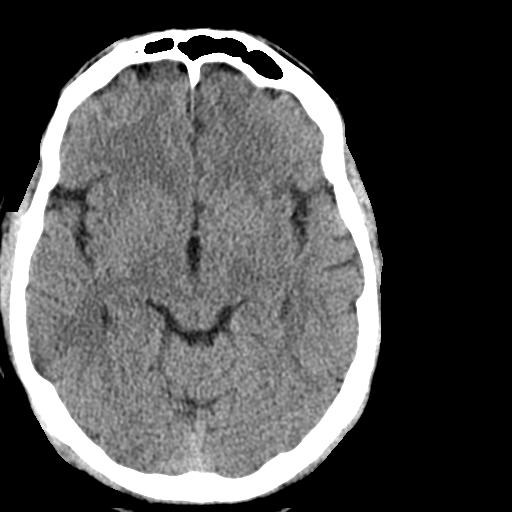
[im 13/31  bone]
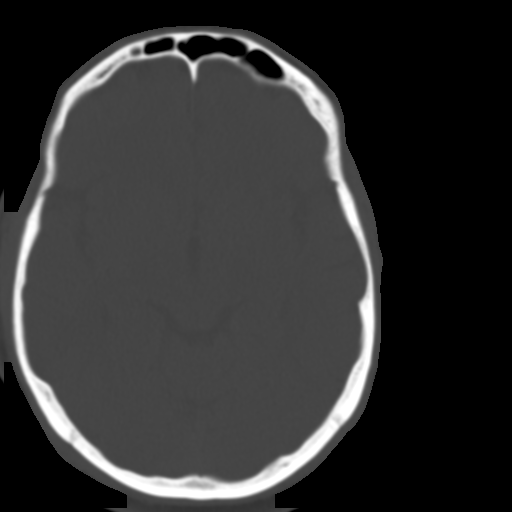
[im 18/31  brain]
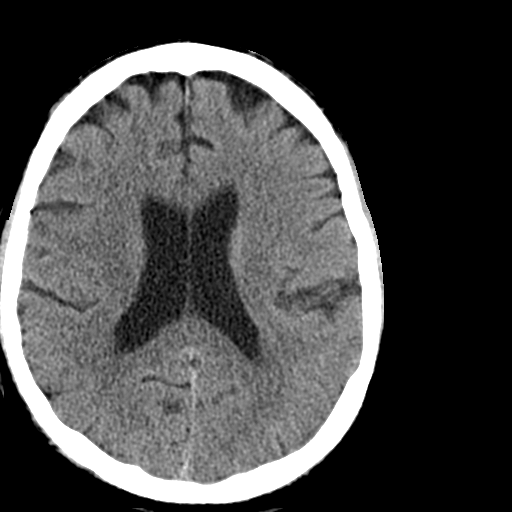
[im 20/31  brain]
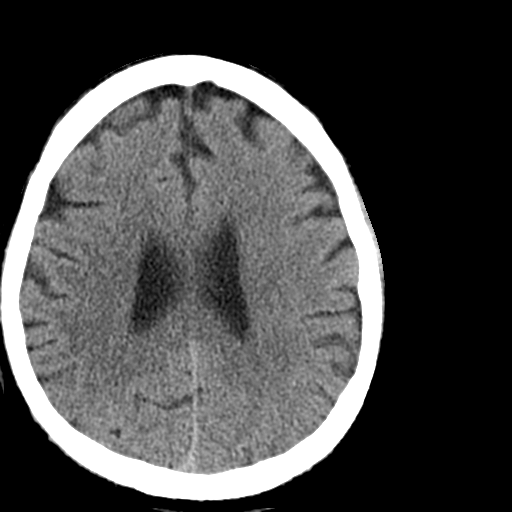
[im 22/31  brain]
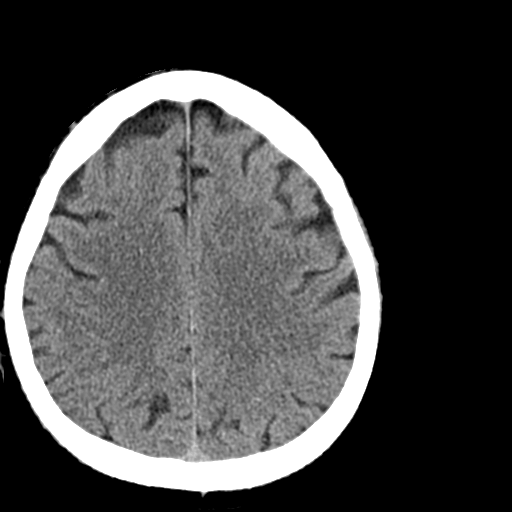
[im 26/31  brain]
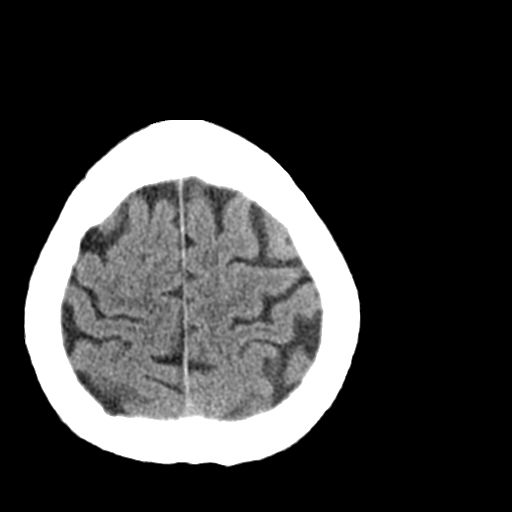
[im 26/31  bone]
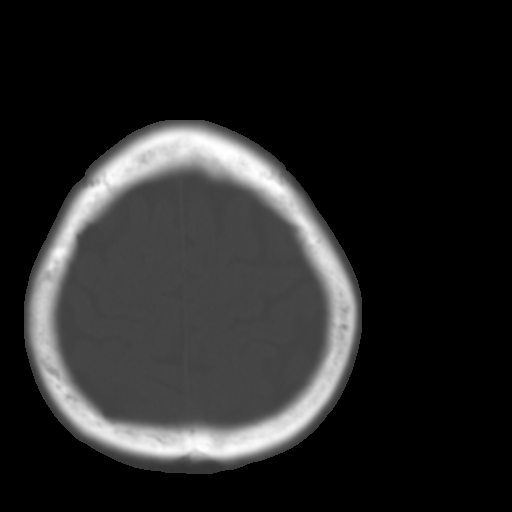
[im 28/31  brain]
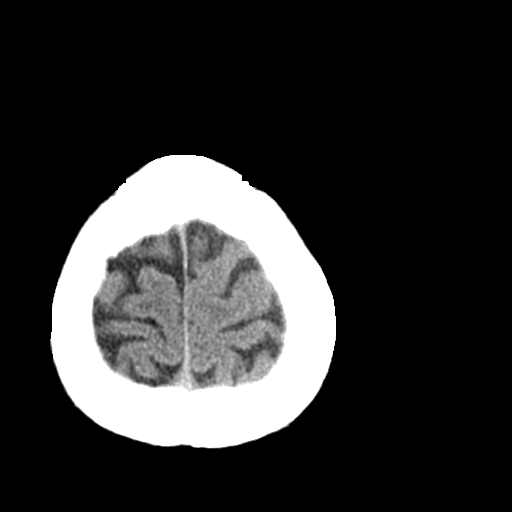

[Series 4: head 3.0 mpr cor · coronal · 0.31mm/px · 3 of 68 slices shown]
[im 23/68  brain]
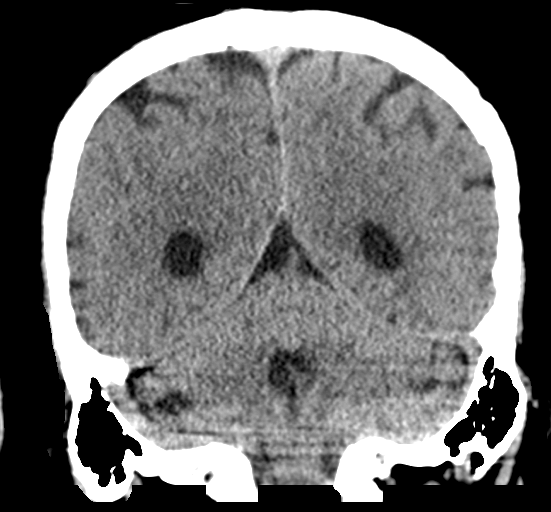
[im 30/68  brain]
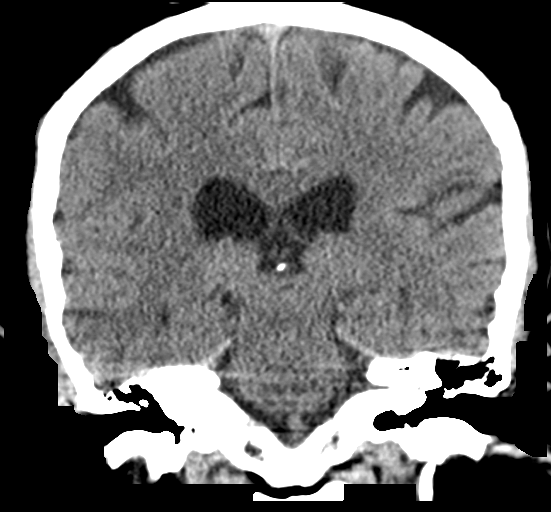
[im 38/68  brain]
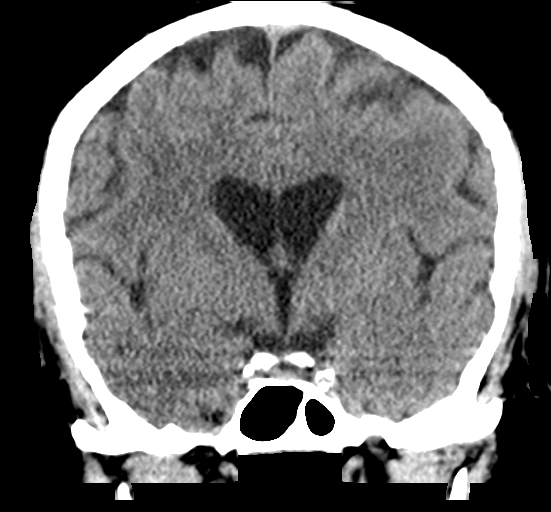

[Series 5: head 3.0 mpr sag · sagittal · 0.30mm/px · 3 of 56 slices shown]
[im 19/56  brain]
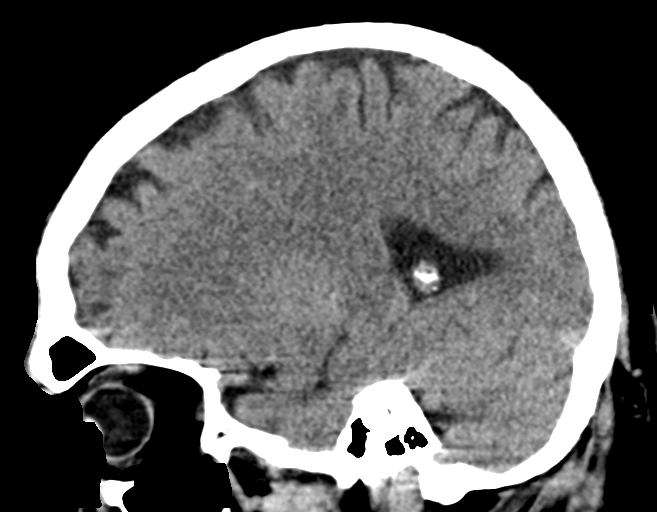
[im 28/56  brain]
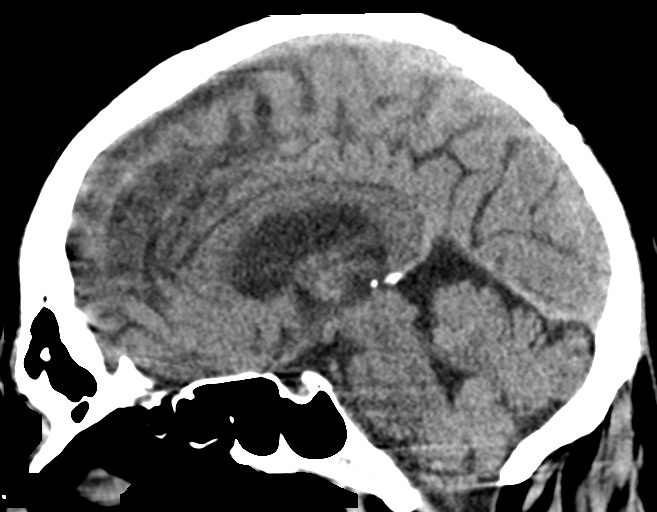
[im 37/56  brain]
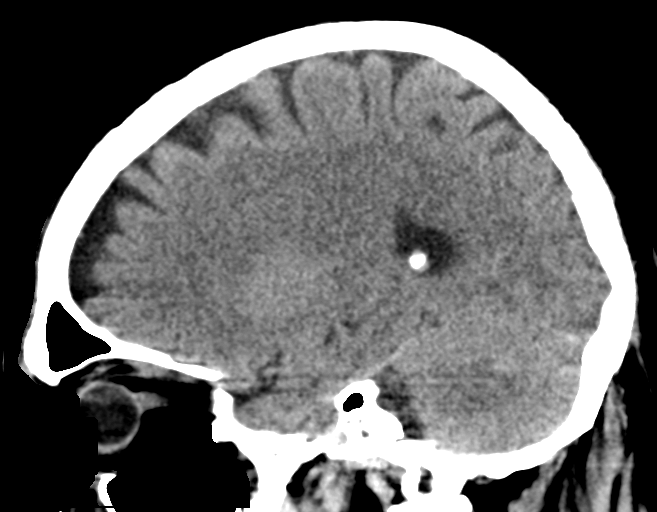

[16 of 47 positions shown; findings below may reference images not displayed]

FINDINGS: Brain: Ventricle size normal.  Cerebral volume normal for age.

Small hypodensity left thalamus compatible with infarct of
indeterminate age. Negative for hemorrhage or mass.

Vascular: Negative for hyperdense vessel

Skull: Negative skull.

Sinuses/Orbits: Paranasal sinuses clear.  Negative orbit

Other: None
IMPRESSION: Small hypodensity left thalamus compatible with infarct of
indeterminate age. Negative for hemorrhage.

## 2022-04-06 ENCOUNTER — Encounter: Payer: Self-pay | Admitting: Internal Medicine

## 2022-04-06 ENCOUNTER — Ambulatory Visit: Payer: Medicare Other | Admitting: Internal Medicine

## 2022-04-06 VITALS — BP 122/76 | HR 55 | Temp 97.7°F | Ht 69.0 in | Wt 197.0 lb

## 2022-04-06 DIAGNOSIS — R413 Other amnesia: Secondary | ICD-10-CM | POA: Diagnosis not present

## 2022-04-06 DIAGNOSIS — F418 Other specified anxiety disorders: Secondary | ICD-10-CM

## 2022-04-06 DIAGNOSIS — R55 Syncope and collapse: Secondary | ICD-10-CM | POA: Insufficient documentation

## 2022-04-06 DIAGNOSIS — H9190 Unspecified hearing loss, unspecified ear: Secondary | ICD-10-CM | POA: Insufficient documentation

## 2022-04-06 DIAGNOSIS — F419 Anxiety disorder, unspecified: Secondary | ICD-10-CM

## 2022-04-06 DIAGNOSIS — Z8673 Personal history of transient ischemic attack (TIA), and cerebral infarction without residual deficits: Secondary | ICD-10-CM

## 2022-04-06 MED ORDER — DONEPEZIL HCL 5 MG PO TABS: 5.0000 mg | ORAL_TABLET | Freq: Every day | ORAL | 5 refills | Status: DC

## 2022-04-06 MED ORDER — FLUOXETINE HCL 10 MG PO TABS: 10.0000 mg | ORAL_TABLET | Freq: Every day | ORAL | 5 refills | Status: DC

## 2022-04-06 NOTE — Assessment & Plan Note (Addendum)
Neurology ref - ?formal cognitive tests Treat depression/mood - Neurology ref to Frann Rider, NP Reduce Xanax Start Fluoxetine and in 2 wks Aricept

## 2022-04-06 NOTE — Assessment & Plan Note (Addendum)
F/u w/Jessica Darden Dates, NP Cont on Plavix, Pravastatin

## 2022-04-06 NOTE — Assessment & Plan Note (Signed)
  Reduce Xanax use Start Fluoxetine and in 2 wks Aricept

## 2022-04-06 NOTE — Progress Notes (Signed)
Subjective:  Patient ID: Larry Stone, male    DOB: 07/17/1950  Age: 71 y.o. MRN: [id number]  CC: Follow-up (Been having a short temper)   HPI [name] presents for a short temper, memory loss forgetful  Outpatient Medications Prior to Visit  Medication Sig Dispense Refill   ALPRAZolam (XANAX) 0.5 MG tablet TAKE 1 TABLET BY MOUTH THREE TIMES A DAY AS NEEDED 270 tablet 0   buPROPion (WELLBUTRIN SR) 150 MG 12 hr tablet TAKE 1 TABLET BY MOUTH TWICE A DAY 180 tablet 3   carvedilol (COREG) 25 MG tablet TAKE 1 TABLET BY MOUTH 2 TIMES DAILY WITH A MEAL. 180 tablet 3   clopidogrel (PLAVIX) 75 MG tablet Take 1 tablet (75 mg total) by mouth daily. 90 tablet 2   clotrimazole-betamethasone (LOTRISONE) cream APPLY AS DIRECTED 45 g 11   ezetimibe (ZETIA) 10 MG tablet Take 1 tablet (10 mg total) by mouth daily. 90 tablet 3   Fluticasone-Umeclidin-Vilant (TRELEGY ELLIPTA) 100-62.5-25 MCG/ACT AEPB Inhale 1 puff into the lungs daily. 1 each 11   hydrALAZINE (APRESOLINE) 50 MG tablet TAKE 1 TABLET BY MOUTH 4 TIMES DAILY. 360 tablet 3   ipratropium-albuterol (DUONEB) 0.5-2.5 (3) MG/3ML SOLN Take 3 mLs by nebulization every 4 (four) hours as needed (every 4-6 hours prn). Take by nebulization every 4-6 hours as needed 360 mL 2   meclizine (ANTIVERT) 25 MG tablet Take 0.5 tablets (12.5 mg total) by mouth 3 (three) times daily as needed for dizziness. 30 tablet 0   metFORMIN (GLUCOPHAGE) 500 MG tablet TAKE 1 TABLET BY MOUTH DAILY WITH BREAKFAST. 90 tablet 1   montelukast (SINGULAIR) 10 MG tablet TAKE 1 TABLET BY MOUTH EVERYDAY AT BEDTIME 90 tablet 3   nitroGLYCERIN (NITROSTAT) 0.4 MG SL tablet Place 1 tablet (0.4 mg total) under the tongue every 5 (five) minutes as needed for chest pain. Up to 3 doses. 75 tablet 2   pravastatin (PRAVACHOL) 40 MG tablet TAKE 1 TABLET BY MOUTH EVERY DAY 90 tablet 3   traMADol (ULTRAM) 50 MG tablet TAKE 1 TABLET BY MOUTH EVERY 6 HOURS AS NEEDED 120 tablet 1   No  facility-administered medications prior to visit.    ROS: Review of Systems  Constitutional:  Negative for appetite change, fatigue and unexpected weight change.  HENT:  Negative for congestion, nosebleeds, sneezing, sore throat and trouble swallowing.   Eyes:  Negative for itching and visual disturbance.  Respiratory:  Negative for cough.   Cardiovascular:  Negative for chest pain, palpitations and leg swelling.  Gastrointestinal:  Negative for abdominal distention, blood in stool, diarrhea and nausea.  Genitourinary:  Negative for frequency and hematuria.  Musculoskeletal:  Negative for back pain, gait problem, joint swelling and neck pain.  Skin:  Negative for rash.  Neurological:  Negative for dizziness, tremors, speech difficulty and weakness.  Psychiatric/Behavioral:  Negative for agitation, dysphoric mood, sleep disturbance and suicidal ideas. The patient is not nervous/anxious.     Objective:  BP 122/76 (BP Location: Left Arm, Patient Position: Sitting, Cuff Size: Normal)   Pulse (!) 55   Temp 97.7 F (36.5 C) (Oral)   Ht '5\' 9"'$  (1.753 m)   Wt 197 lb (89.4 kg)   SpO2 99%   BMI 29.09 kg/m   BP Readings from Last 3 Encounters:  04/06/22 122/76  01/12/22 128/82  12/10/21 136/70    Wt Readings from Last 3 Encounters:  04/06/22 197 lb (89.4 kg)  01/12/22 196 lb (88.9 kg)  12/10/21  196 lb 3.2 oz (89 kg)    Physical Exam Constitutional:      General: He is not in acute distress.    Appearance: He is well-developed.     Comments: NAD  Eyes:     Conjunctiva/sclera: Conjunctivae normal.     Pupils: Pupils are equal, round, and reactive to light.  Neck:     Thyroid: No thyromegaly.     Vascular: No JVD.  Cardiovascular:     Rate and Rhythm: Normal rate and regular rhythm.     Heart sounds: Normal heart sounds. No murmur heard.    No friction rub. No gallop.  Pulmonary:     Effort: Pulmonary effort is normal. No respiratory distress.     Breath sounds: Normal  breath sounds. No wheezing or rales.  Chest:     Chest wall: No tenderness.  Abdominal:     General: Bowel sounds are normal. There is no distension.     Palpations: Abdomen is soft. There is no mass.     Tenderness: There is no abdominal tenderness. There is no guarding or rebound.  Musculoskeletal:        General: No tenderness. Normal range of motion.     Cervical back: Normal range of motion.  Lymphadenopathy:     Cervical: No cervical adenopathy.  Skin:    General: Skin is warm and dry.     Findings: No rash.  Neurological:     Mental Status: He is alert and oriented to person, place, and time.     Cranial Nerves: No cranial nerve deficit.     Motor: No abnormal muscle tone.     Coordination: Coordination normal.     Gait: Gait normal.     Deep Tendon Reflexes: Reflexes are normal and symmetric.  Psychiatric:        Behavior: Behavior normal.        Thought Content: Thought content normal.        Judgment: Judgment normal.     Lab Results  Component Value Date   WBC 7.8 01/11/2022   HGB 13.3 01/11/2022   HCT 39.3 01/11/2022   PLT 148.0 (L) 01/11/2022   GLUCOSE 118 (H) 01/11/2022   CHOL 132 01/11/2022   TRIG 86.0 01/11/2022   HDL 47.90 01/11/2022   LDLCALC 67 01/11/2022   ALT 15 01/11/2022   AST 16 01/11/2022   NA 140 01/11/2022   K 4.2 01/11/2022   CL 104 01/11/2022   CREATININE 1.29 01/11/2022   BUN 19 01/11/2022   CO2 27 01/11/2022   TSH 2.27 01/11/2022   PSA 0.33 01/11/2022   INR 1.1 12/04/2019   HGBA1C 5.6 01/11/2022    MR Lumbar Spine w/o contrast  Result Date: 12/06/2020 CLINICAL DATA:  Lumbar radiculopathy. EXAM: MRI LUMBAR SPINE WITHOUT CONTRAST TECHNIQUE: Multiplanar, multisequence MR imaging of the lumbar spine was performed. No intravenous contrast was administered. COMPARISON:  None. FINDINGS: Segmentation:  Standard. Alignment:  2 mm retrolisthesis of L2 on L3, L3 on L4 and L4 on L5. Vertebrae: No acute fracture, evidence of discitis, or  aggressive bone lesion. Conus medullaris and cauda equina: Conus extends to the L1 level. Conus and cauda equina appear normal. Paraspinal and other soft tissues: No acute paraspinal abnormality. Disc levels: Disc spaces: Degenerative disease with disc height loss at T12-L1, L1-2, L2-3, L3-4 and L4-5. T12-L1: No significant disc bulge. No neural foraminal stenosis. No central canal stenosis. L1-L2: No disc protrusion. Mild left foraminal stenosis. No right foraminal stenosis.  Mild bilateral facet arthropathy. L2-L3: Mild broad-based disc bulge. Moderate bilateral facet arthropathy. Mild spinal stenosis. Moderate left and mild right foraminal stenosis. L3-L4: Mild broad-based disc bulge. Mild bilateral facet arthropathy. Bilateral lateral recess stenosis. Mild right and moderate left foraminal stenosis. No central canal stenosis. L4-L5: Broad-based disc bulge. Moderate bilateral facet arthropathy. Moderate right foraminal stenosis. No left foraminal stenosis. No spinal stenosis. L5-S1: Mild broad-based disc bulge. Moderate bilateral facet arthropathy, right worse than left. Reactive marrow edema involving the right facet joint. Right L5 pars interarticularis defect. Mild right foraminal stenosis. No left foraminal stenosis. IMPRESSION: 1. Lumbar spine spondylosis as described above. 2. No acute osseous injury of the lumbar spine. Electronically Signed   By: Kathreen Devoid M.D.   On: 12/06/2020 08:22   MR EVOJJ RIGHT WO CONTRAST  Result Date: 12/05/2020 CLINICAL DATA:  Posterior right thigh pain for several months. Stress fracture suspected. EXAM: MRI OF THE RIGHT FEMUR WITHOUT CONTRAST TECHNIQUE: Multiplanar, multisequence MR imaging of the right femur was performed. No intravenous contrast was administered. COMPARISON:  X-ray 05/31/2017 FINDINGS: Bones/Joint/Cartilage No acute fracture. No dislocation. No significant bone marrow edema. No periostitis. No suspicious marrow replacing bone lesion. Small bilateral  knee joint effusions are evident. Evaluation for internal derangement is limited on large field-of-view imaging. Ligaments Intact. Muscles and Tendons Complete tear of the hamstring component of the adductor magnus tendon origin from the ischial tuberosity (series 8, images 11-13; series 7, image 19) with approximately 2.4 cm retraction with fluid-filled gap. Tendinosis with low-grade partial tearing of the semimembranosus tendon as well as the conjoint tendon of the long head biceps femoris and semitendinosus without a full-thickness or retracted component. Intramuscular edema within the proximal hamstring compartment. Origins of the contralateral left hamstring tendons appear intact on limited view. Soft tissues No well-defined hematoma. No mass identified within the soft tissues. Small right and trace left hydroceles. IMPRESSION: 1. Complete tear of the hamstring component of the adductor magnus tendon origin from the ischial tuberosity with approximately 2.4 cm retraction with fluid-filled gap. 2. Tendinosis with low-grade partial tearing of the semimembranosus tendon as well as the conjoint tendon of the long head biceps femoris and semitendinosus without a full-thickness or retracted component. Electronically Signed   By: Davina Poke D.O.   On: 12/05/2020 15:59    Assessment & Plan:   Problem List Items Addressed This Visit     Memory disturbance - Primary    Neurology ref - ?formal cognitive tests Treat depression/mood - Neurology ref to Frann Rider, NP Reduce Xanax Start Fluoxetine and in 2 wks Aricept      Relevant Orders   Ambulatory referral to Neurology   History of CVA (cerebrovascular accident)    F/u w/Jessica Darden Dates, NP Cont on Plavix, Pravastatin      Relevant Orders   Ambulatory referral to Neurology   Depression with anxiety     Reduce Xanax use Start Fluoxetine and in 2 wks Aricept      Relevant Medications   FLUoxetine (PROZAC) 10 MG tablet   Anxiety disorder     Counseling offered Reduce Xanax use Start Fluoxetine and in 2 wks Aricept RTC 6 wks      Relevant Medications   FLUoxetine (PROZAC) 10 MG tablet      Meds ordered this encounter  Medications   FLUoxetine (PROZAC) 10 MG tablet    Sig: Take 1 tablet (10 mg total) by mouth daily.    Dispense:  30 tablet    Refill:  5   donepezil (ARICEPT) 5 MG tablet    Sig: Take 1 tablet (5 mg total) by mouth at bedtime.    Dispense:  30 tablet    Refill:  5      Follow-up: Return in about 6 weeks (around 05/18/2022) for a follow-up visit.  [name], MD

## 2022-04-06 NOTE — Assessment & Plan Note (Addendum)
Counseling offered Reduce Xanax use Start Fluoxetine and in 2 wks Aricept RTC 6 wks

## 2022-04-13 ENCOUNTER — Telehealth: Payer: Self-pay | Admitting: Internal Medicine

## 2022-04-13 NOTE — Telephone Encounter (Signed)
LVM informing pt that I rescheduled his AWV fron 04/13/22 to 05/06/24 because [facility] was out of the office on 04/14/22.

## 2022-04-14 ENCOUNTER — Ambulatory Visit: Payer: Medicare Other

## 2022-04-30 ENCOUNTER — Ambulatory Visit: Payer: Medicare Other | Admitting: Internal Medicine

## 2022-05-06 ENCOUNTER — Ambulatory Visit (INDEPENDENT_AMBULATORY_CARE_PROVIDER_SITE_OTHER): Payer: Medicare Other

## 2022-05-06 VITALS — Ht 69.0 in | Wt 195.0 lb

## 2022-05-06 DIAGNOSIS — Z0000 Encounter for general adult medical examination without abnormal findings: Secondary | ICD-10-CM

## 2022-05-06 NOTE — Patient Instructions (Signed)
Larry Stone , Thank you for taking time to come for your Medicare Wellness Visit. I appreciate your ongoing commitment to your health goals. Please review the following plan we discussed and let me know if I can assist you in the future.   These are the goals we discussed:  Goals      My goal for 2024 is to maintain my health and stay alive.        This is a list of the screening recommended for you and due dates:  Health Maintenance  Topic Date Due   COVID-19 Vaccine (4 - 2023-24 season) 12/18/2021   DTaP/Tdap/Td vaccine (2 - Td or Tdap) 11/15/2022   Medicare Annual Wellness Visit  05/07/2023   Colon Cancer Screening  08/12/2026   Pneumonia Vaccine  Completed   Flu Shot  Completed   Hepatitis C Screening: USPSTF Recommendation to screen - Ages 18-79 yo.  Completed   Zoster (Shingles) Vaccine  Completed   HPV Vaccine  Aged Out    Advanced directives: Yes  Conditions/risks identified: Yes  Next appointment: Follow up in one year for your annual wellness visit.   Preventive Care 29 Years and Older, Male  Preventive care refers to lifestyle choices and visits with your health care provider that can promote health and wellness. What does preventive care include? A yearly physical exam. This is also called an annual well check. Dental exams once or twice a year. Routine eye exams. Ask your health care provider how often you should have your eyes checked. Personal lifestyle choices, including: Daily care of your teeth and gums. Regular physical activity. Eating a healthy diet. Avoiding tobacco and drug use. Limiting alcohol use. Practicing safe sex. Taking low doses of aspirin every day. Taking vitamin and mineral supplements as recommended by your health care provider. What happens during an annual well check? The services and screenings done by your health care provider during your annual well check will depend on your age, overall health, lifestyle risk factors, and family  history of disease. Counseling  Your health care provider may ask you questions about your: Alcohol use. Tobacco use. Drug use. Emotional well-being. Home and relationship well-being. Sexual activity. Eating habits. History of falls. Memory and ability to understand (cognition). Work and work Statistician. Screening  You may have the following tests or measurements: Height, weight, and BMI. Blood pressure. Lipid and cholesterol levels. These may be checked every 5 years, or more frequently if you are over 22 years old. Skin check. Lung cancer screening. You may have this screening every year starting at age 56 if you have a 30-pack-year history of smoking and currently smoke or have quit within the past 15 years. Fecal occult blood test (FOBT) of the stool. You may have this test every year starting at age 69. Flexible sigmoidoscopy or colonoscopy. You may have a sigmoidoscopy every 5 years or a colonoscopy every 10 years starting at age 61. Prostate cancer screening. Recommendations will vary depending on your family history and other risks. Hepatitis C blood test. Hepatitis B blood test. Sexually transmitted disease (STD) testing. Diabetes screening. This is done by checking your blood sugar (glucose) after you have not eaten for a while (fasting). You may have this done every 1-3 years. Abdominal aortic aneurysm (AAA) screening. You may need this if you are a current or former smoker. Osteoporosis. You may be screened starting at age 48 if you are at high risk. Talk with your health care provider about your test  results, treatment options, and if necessary, the need for more tests. Vaccines  Your health care provider may recommend certain vaccines, such as: Influenza vaccine. This is recommended every year. Tetanus, diphtheria, and acellular pertussis (Tdap, Td) vaccine. You may need a Td booster every 10 years. Zoster vaccine. You may need this after age 72. Pneumococcal  13-valent conjugate (PCV13) vaccine. One dose is recommended after age 17. Pneumococcal polysaccharide (PPSV23) vaccine. One dose is recommended after age 56. Talk to your health care provider about which screenings and vaccines you need and how often you need them. This information is not intended to replace advice given to you by your health care provider. Make sure you discuss any questions you have with your health care provider. Document Released: 05/02/2015 Document Revised: 12/24/2015 Document Reviewed: 02/04/2015 Elsevier Interactive Patient Education  2017 [place] Prevention in the Home Falls can cause injuries. They can happen to people of all ages. There are many things you can do to make your home safe and to help prevent falls. What can I do on the outside of my home? Regularly fix the edges of walkways and driveways and fix any cracks. Remove anything that might make you trip as you walk through a door, such as a raised step or threshold. Trim any bushes or trees on the path to your home. Use bright outdoor lighting. Clear any walking paths of anything that might make someone trip, such as rocks or tools. Regularly check to see if handrails are loose or broken. Make sure that both sides of any steps have handrails. Any raised decks and porches should have guardrails on the edges. Have any leaves, snow, or ice cleared regularly. Use sand or salt on walking paths during winter. Clean up any spills in your garage right away. This includes oil or grease spills. What can I do in the bathroom? Use night lights. Install grab bars by the toilet and in the tub and shower. Do not use towel bars as grab bars. Use non-skid mats or decals in the tub or shower. If you need to sit down in the shower, use a plastic, non-slip stool. Keep the floor dry. Clean up any water that spills on the floor as soon as it happens. Remove soap buildup in the tub or shower regularly. Attach  bath mats securely with double-sided non-slip rug tape. Do not have throw rugs and other things on the floor that can make you trip. What can I do in the bedroom? Use night lights. Make sure that you have a light by your bed that is easy to reach. Do not use any sheets or blankets that are too big for your bed. They should not hang down onto the floor. Have a firm chair that has side arms. You can use this for support while you get dressed. Do not have throw rugs and other things on the floor that can make you trip. What can I do in the kitchen? Clean up any spills right away. Avoid walking on wet floors. Keep items that you use a lot in easy-to-reach places. If you need to reach something above you, use a strong step stool that has a grab bar. Keep electrical cords out of the way. Do not use floor polish or wax that makes floors slippery. If you must use wax, use non-skid floor wax. Do not have throw rugs and other things on the floor that can make you trip. What can I do with my stairs?  Do not leave any items on the stairs. Make sure that there are handrails on both sides of the stairs and use them. Fix handrails that are broken or loose. Make sure that handrails are as long as the stairways. Check any carpeting to make sure that it is firmly attached to the stairs. Fix any carpet that is loose or worn. Avoid having throw rugs at the top or bottom of the stairs. If you do have throw rugs, attach them to the floor with carpet tape. Make sure that you have a light switch at the top of the stairs and the bottom of the stairs. If you do not have them, ask someone to add them for you. What else can I do to help prevent falls? Wear shoes that: Do not have high heels. Have rubber bottoms. Are comfortable and fit you well. Are closed at the toe. Do not wear sandals. If you use a stepladder: Make sure that it is fully opened. Do not climb a closed stepladder. Make sure that both sides of the  stepladder are locked into place. Ask someone to hold it for you, if possible. Clearly mark and make sure that you can see: Any grab bars or handrails. First and last steps. Where the edge of each step is. Use tools that help you move around (mobility aids) if they are needed. These include: Canes. Walkers. Scooters. Crutches. Turn on the lights when you go into a dark area. Replace any light bulbs as soon as they burn out. Set up your furniture so you have a clear path. Avoid moving your furniture around. If any of your floors are uneven, fix them. If there are any pets around you, be aware of where they are. Review your medicines with your doctor. Some medicines can make you feel dizzy. This can increase your chance of falling. Ask your doctor what other things that you can do to help prevent falls. This information is not intended to replace advice given to you by your health care provider. Make sure you discuss any questions you have with your health care provider. Document Released: 01/30/2009 Document Revised: 09/11/2015 Document Reviewed: 05/10/2014 Elsevier Interactive Patient Education  2017 [employer].

## 2022-05-06 NOTE — Progress Notes (Addendum)
Virtual Visit via Telephone Note  I connected with  Larry Stone on 05/06/22 at 11:30 AM EST by telephone and verified that I am speaking with the correct person using two identifiers.  Location: Patient: Home Provider: [place] Persons participating in the virtual visit: [place]   I discussed the limitations, risks, security and privacy concerns of performing an evaluation and management service by telephone and the availability of in person appointments. The patient expressed understanding and agreed to proceed.  Interactive audio and video telecommunications were attempted between this nurse and patient, however failed, due to patient having technical difficulties OR patient did not have access to video capability.  We continued and completed visit with audio only.  Some vital signs may be absent or patient reported.   Sheral Flow, LPN  Subjective:   Larry Stone is a 72 y.o. male who presents for Medicare Annual/Subsequent preventive examination.  Review of Systems     Cardiac Risk Factors include: advanced age (>35mn, >>78women);dyslipidemia;diabetes mellitus;family history of premature cardiovascular disease;hypertension;male gender;sedentary lifestyle     Objective:    Today's Vitals   05/06/22 1133  Weight: 195 lb (88.5 kg)  Height: '5\' 9"'$  (1.753 m)  PainSc: 0-No pain   Body mass index is 28.8 kg/m.     05/06/2022   11:35 AM 04/07/2021   11:33 AM 08/19/2020    2:48 PM 02/28/2020    3:20 PM 12/07/2019   10:50 AM 12/04/2019   12:54 PM 12/06/2018    9:47 AM  Advanced Directives  Does Patient Have a Medical Advance Directive? Yes Yes Yes Yes No No Yes  Type of AParamedicof ABrandonLiving will Living will;Healthcare Power of Attorney  Living will;Healthcare Power of AWarnerLiving will  Does patient want to make changes to medical advance directive?  No - Patient  declined No - Patient declined No - Patient declined     Copy of HNixain Chart? No - copy requested No - copy requested  No - copy requested   No - copy requested  Would patient like information on creating a medical advance directive?     No - Patient declined No - Patient declined     Current Medications (verified) Outpatient Encounter Medications as of 05/06/2022  Medication Sig   ALPRAZolam (XANAX) 0.5 MG tablet TAKE 1 TABLET BY MOUTH THREE TIMES A DAY AS NEEDED   buPROPion (WELLBUTRIN SR) 150 MG 12 hr tablet TAKE 1 TABLET BY MOUTH TWICE A DAY   carvedilol (COREG) 25 MG tablet TAKE 1 TABLET BY MOUTH 2 TIMES DAILY WITH A MEAL.   clopidogrel (PLAVIX) 75 MG tablet Take 1 tablet (75 mg total) by mouth daily.   clotrimazole-betamethasone (LOTRISONE) cream APPLY AS DIRECTED   donepezil (ARICEPT) 5 MG tablet Take 1 tablet (5 mg total) by mouth at bedtime.   ezetimibe (ZETIA) 10 MG tablet Take 1 tablet (10 mg total) by mouth daily.   FLUoxetine (PROZAC) 10 MG tablet Take 1 tablet (10 mg total) by mouth daily.   Fluticasone-Umeclidin-Vilant (TRELEGY ELLIPTA) 100-62.5-25 MCG/ACT AEPB Inhale 1 puff into the lungs daily.   hydrALAZINE (APRESOLINE) 50 MG tablet TAKE 1 TABLET BY MOUTH 4 TIMES DAILY.   ipratropium-albuterol (DUONEB) 0.5-2.5 (3) MG/3ML SOLN Take 3 mLs by nebulization every 4 (four) hours as needed (every 4-6 hours prn). Take by nebulization every 4-6 hours as needed   meclizine (ANTIVERT) 25 MG tablet  Take 0.5 tablets (12.5 mg total) by mouth 3 (three) times daily as needed for dizziness.   metFORMIN (GLUCOPHAGE) 500 MG tablet TAKE 1 TABLET BY MOUTH DAILY WITH BREAKFAST.   montelukast (SINGULAIR) 10 MG tablet TAKE 1 TABLET BY MOUTH EVERYDAY AT BEDTIME   nitroGLYCERIN (NITROSTAT) 0.4 MG SL tablet Place 1 tablet (0.4 mg total) under the tongue every 5 (five) minutes as needed for chest pain. Up to 3 doses.   pravastatin (PRAVACHOL) 40 MG tablet TAKE 1 TABLET BY MOUTH  EVERY DAY   traMADol (ULTRAM) 50 MG tablet TAKE 1 TABLET BY MOUTH EVERY 6 HOURS AS NEEDED   No facility-administered encounter medications on file as of 05/06/2022.    Allergies (verified) Ace inhibitors, Angiotensin receptor blockers, Aspirin, Amlodipine, Crestor [rosuvastatin calcium], Cymbalta [duloxetine hcl], Lipitor [atorvastatin calcium], Codeine, Irbesartan, and Ramipril   History: Past Medical History:  Diagnosis Date   Allergy    Anxiety    CAD    a. acute inferior lateral wall infarction in September 2004 treated medically. b.  cath 06/17/16 showing mild nonobstructive CAD with 30-40% ostial LM (eccentric), elevated LV filling pressures and normal LV function.   Chronic diastolic CHF (congestive heart failure) (HCC)    CKD (chronic kidney disease), stage II    COPD (chronic obstructive pulmonary disease) (HCC)    Diverticulosis    DJD (degenerative joint disease)    GERD (gastroesophageal reflux disease)    HTN (hypertension)    Hyperlipidemia    Low back pain syndrome    Myocardial infarction ([place]) 2004   Obesity    OSA (obstructive sleep apnea)    RBBB    Recurrent aspiration bronchitis/pneumonia    Recurrent aspiration pneumonia ([place])    [name] 07/13/2016   Stroke ([place])    Thrombocytopenia (HCC)    Tubular adenoma of colon 2014   Past Surgical History:  Procedure Laterality Date   CARDIAC CATHETERIZATION     LEFT HEART CATH AND CORONARY ANGIOGRAPHY N/A 06/17/2016   Procedure: Left Heart Cath and Coronary Angiography;  Surgeon: Burnell Blanks, MD;  Location: [facility];  Service: Cardiovascular;  Laterality: N/A;   UVULOPALATOPHARYNGOPLASTY     surgery for OSA   Family History  Problem Relation Age of Onset   Stroke Mother    Heart disease Mother    Colon cancer Father    Hypertension Sister    Diabetes Sister    Colon cancer Paternal Uncle    Stomach cancer Neg Hx    Esophageal cancer Neg Hx    Pancreatic cancer Neg Hx    Social History    Socioeconomic History   Marital status: Married    Spouse name: Not on file   Number of children: 1   Years of education: Not on file   Highest education level: Not on file  Occupational History   Occupation: Probation officer of [employer]  Tobacco Use   Smoking status: Former    Packs/day: 1.00    Years: 20.00    Total pack years: 20.00    Types: Cigarettes, Cigars    Quit date: 05/08/1998    Years since quitting: 24.0   Smokeless tobacco: Never   Tobacco comments:    quit in 2005  Vaping Use   Vaping Use: Never used  Substance and Sexual Activity   Alcohol use: Yes    Alcohol/week: 1.0 standard drink of alcohol    Types: 1 Standard drinks or equivalent per week    Comment: rarely  Drug use: No   Sexual activity: Yes  Other Topics Concern   Not on file  Social History Narrative   Lives with spouse only   Right Handed   Drinks <1 cup caffeine daily   Social Determinants of Health   Financial Resource Strain: Low Risk  (05/06/2022)   Overall Financial Resource Strain (CARDIA)    Difficulty of Paying Living Expenses: Not hard at all  Food Insecurity: No Food Insecurity (05/06/2022)   Hunger Vital Sign    Worried About Running Out of Food in the Last Year: Never true    Ran Out of Food in the Last Year: Never true  Transportation Needs: No Transportation Needs (05/06/2022)   PRAPARE - Hydrologist (Medical): No    Lack of Transportation (Non-Medical): No  Physical Activity: Inactive (05/06/2022)   Exercise Vital Sign    Days of Exercise per Week: 0 days    Minutes of Exercise per Session: 0 min  Stress: No Stress Concern Present (05/06/2022)   [place]    Feeling of Stress : Not at all  Social Connections: Unknown (05/06/2022)   Social Connection and Isolation Panel [NHANES]    Frequency of Communication with Friends and Family: More than three times a week     Frequency of Social Gatherings with Friends and Family: More than three times a week    Attends Religious Services: Patient refused    Marine scientist or Organizations: Patient refused    Attends Music therapist: Patient refused    Marital Status: Married    Tobacco Counseling Counseling given: Not Answered Tobacco comments: quit in 2005   Clinical Intake:  Pre-visit preparation completed: Yes  Pain : No/denies pain Pain Score: 0-No pain     BMI - recorded: 28.8 Nutritional Status: BMI 25 -29 Overweight Nutritional Risks: None Diabetes: No  How often do you need to have someone help you when you read instructions, pamphlets, or other written materials from your doctor or pharmacy?: 1 - Never 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  Interpreter Needed?: No  Information entered by :: Lisette Abu, LPN.   Activities of Daily Living    05/06/2022   11:44 AM  In your present state of health, do you have any difficulty performing the following activities:  Hearing? 0  Vision? 0  Difficulty concentrating or making decisions? 0  Walking or climbing stairs? 0  Dressing or bathing? 1  Comment uses a walking stick for gait  Doing errands, shopping? 0  Preparing Food and eating ? N  Using the Toilet? N  In the past six months, have you accidently leaked urine? N  Do you have problems with loss of bowel control? N  Managing your Medications? N  Managing your Finances? N  Housekeeping or managing your Housekeeping? N    Patient Care Team: Plotnikov, Evie Lacks, MD as PCP - General (Internal Medicine) Burnell Blanks, MD as PCP - Cardiology (Cardiology) Burnell Blanks, MD as Consulting Physician (Cardiology) Loletha Carrow Kirke Corin, MD as Consulting Physician (Gastroenterology) Garvin Fila, MD as Consulting Physician  (Neurology) Rosita Fire, MD as Consulting Physician (Nephrology) Pa, Watson as Consulting Physician (Optometry)  Indicate any recent Medical Services you may have received from other than [facility] providers in the past year (date may  be approximate).     Assessment:   This is a routine wellness examination for Larry Stone.  Hearing/Vision screen Hearing Screening - Comments:: Patient wears hearing aids. Vision Screening - Comments:: Wears rx glasses - up to date with routine eye exams with [facility].   Dietary issues and exercise activities discussed: Current Exercise Habits: The patient does not participate in regular exercise at present, Exercise limited by: cardiac condition(s);respiratory conditions(s);psychological condition(s)   Goals Addressed             This Visit's Progress    My goal for 2024 is to maintain my health and stay alive.        Depression Screen    04/06/2022   11:24 AM 10/01/2021    1:11 PM 06/24/2021    1:25 PM 04/07/2021   11:28 AM 02/17/2021    1:44 PM 09/01/2020   11:11 AM 06/03/2020    1:45 PM  PHQ 2/9 Scores  PHQ - 2 Score 0 1 0 0 0 2 0  PHQ- 9 Score  7 0  0 3 0    Fall Risk    05/06/2022   11:36 AM 04/06/2022   11:23 AM 10/01/2021    1:11 PM 06/24/2021    1:25 PM 04/07/2021   11:30 AM  Fall Risk   Falls in the past year? '1 1 1 '$ 0 1  Number falls in past yr: 1 1 0 1 0  Injury with Fall? 0 0 1 0 1  Risk for fall due to : History of fall(s) History of fall(s)     Follow up Falls evaluation completed Falls evaluation completed   Falls prevention discussed    FALL RISK PREVENTION PERTAINING TO THE HOME:  Any stairs in or around the home? Yes  stair lift If so, are there any without handrails? No  Home free of loose throw rugs in walkways, pet beds, electrical cords, etc? Yes  Adequate lighting in your home to reduce risk of falls? Yes   ASSISTIVE DEVICES UTILIZED TO PREVENT FALLS:  Life alert? No  Use of a cane,  walker or w/c? Yes  Grab bars in the bathroom? Yes  Shower chair or bench in shower? Yes  Elevated toilet seat or a handicapped toilet? Yes   TIMED UP AND GO:  Was the test performed? No . Phone Visit  Cognitive Function:        05/06/2022   11:45 AM 02/28/2020    3:22 PM  6CIT Screen  What Year? 0 points 0 points  What month? 0 points 0 points  What time? 0 points 0 points  Count back from 20 0 points 0 points  Months in reverse 0 points 0 points  Repeat phrase 0 points 0 points  Total Score 0 points 0 points    Immunizations Immunization History  Administered Date(s) Administered   Fluad Quad(high Dose 65+) 12/11/2018, 12/18/2021   Influenza Split 02/02/2011, 03/03/2012   Influenza Whole 02/02/2010   Influenza, High Dose Seasonal PF 02/20/2013, 01/16/2015, 12/16/2015, 12/22/2016, 01/04/2018, 01/08/2020, 01/21/2021   Influenza, Seasonal, Injecte, Preservative Fre 02/21/2014   Influenza-Unspecified 01/08/2020   Moderna Sars-Covid-2 Vaccination 05/24/2019, 06/22/2019, 01/15/2020   PNEUMOCOCCAL CONJUGATE-20 01/21/2021   Pneumococcal Conjugate-13 03/05/2014, 12/26/2019   Pneumococcal Polysaccharide-23 06/18/2010, 09/03/2015   Respiratory Syncytial Virus Vaccine,Recomb Aduvanted(Arexvy) 12/18/2021   Tdap 11/14/2012   Zoster Recombinat (Shingrix) 12/22/2016, 03/14/2017   Zoster, Live 09/11/2012    TDAP status: Up to date  Flu Vaccine status: Up to date  Pneumococcal vaccine status: Up to date  Covid-19 vaccine status: Completed vaccines  Qualifies for Shingles Vaccine? Yes   Zostavax completed Yes   Shingrix Completed?: Yes  Screening Tests Health Maintenance  Topic Date Due   COVID-19 Vaccine (4 - 2023-24 season) 12/18/2021   DTaP/Tdap/Td (2 - Td or Tdap) 11/15/2022   Medicare Annual Wellness (AWV)  05/07/2023   COLONOSCOPY (Pts 45-38yr Insurance coverage will need to be confirmed)  08/12/2026   Pneumonia Vaccine 72 Years old  Completed   INFLUENZA  VACCINE  Completed   Hepatitis C Screening  Completed   Zoster Vaccines- Shingrix  Completed   HPV VACCINES  Aged Out    Health Maintenance  Health Maintenance Due  Topic Date Due   COVID-19 Vaccine (4 - 2023-24 season) 12/18/2021    Colorectal cancer screening: Type of screening: Colonoscopy. Completed 08/11/2021. Repeat every 5 years  Lung Cancer Screening: (Low Dose CT Chest recommended if Age 72-80years, 30 pack-year currently smoking OR have quit w/in 15years.) does not qualify.   Lung Cancer Screening Referral: no  Additional Screening:  Hepatitis C Screening: does qualify; Completed 08/20/2016  Vision Screening: Recommended annual ophthalmology exams for early detection of glaucoma and other disorders of the eye. Is the patient up to date with their annual eye exam?  Yes  Who is the provider or what is the name of the office in which the patient attends annual eye exams? [facility] is not established with a provider, would they like to be referred to a provider to establish care? No .   Dental Screening: Recommended annual dental exams for proper oral hygiene  Community Resource Referral / Chronic Care Management: CRR required this visit?  No   CCM required this visit?  No      Plan:     I have personally reviewed and noted the following in the patient's chart:   Medical and social history Use of alcohol, tobacco or illicit drugs  Current medications and supplements including opioid prescriptions. Patient is not currently taking opioid prescriptions. Functional ability and status Nutritional status Physical activity Advanced directives List of other physicians Hospitalizations, surgeries, and ER visits in previous 12 months Vitals Screenings to include cognitive, depression, and falls Referrals and appointments  In addition, I have reviewed and discussed with patient certain preventive protocols, quality metrics, and best practice  recommendations. A written personalized care plan for preventive services as well as general preventive health recommendations were provided to patient.     SSheral Flow LPN   14/25/9563  Nurse Notes: n/a  Medical screening examination/treatment/procedure(s) were performed by non-physician practitioner and as supervising physician I was immediately available for consultation/collaboration.  I agree with above. ALew Dawes MD

## 2022-05-18 ENCOUNTER — Ambulatory Visit: Payer: Medicare Other | Admitting: Internal Medicine

## 2022-05-25 ENCOUNTER — Other Ambulatory Visit (INDEPENDENT_AMBULATORY_CARE_PROVIDER_SITE_OTHER): Payer: Medicare Other

## 2022-05-25 ENCOUNTER — Other Ambulatory Visit: Payer: Self-pay | Admitting: Internal Medicine

## 2022-05-25 ENCOUNTER — Telehealth: Payer: Self-pay | Admitting: *Deleted

## 2022-05-25 ENCOUNTER — Ambulatory Visit: Payer: Medicare Other | Admitting: Neurology

## 2022-05-25 ENCOUNTER — Encounter: Payer: Self-pay | Admitting: Neurology

## 2022-05-25 VITALS — BP 135/75 | HR 60 | Ht 69.0 in | Wt 190.0 lb

## 2022-05-25 DIAGNOSIS — N390 Urinary tract infection, site not specified: Secondary | ICD-10-CM | POA: Diagnosis not present

## 2022-05-25 DIAGNOSIS — R739 Hyperglycemia, unspecified: Secondary | ICD-10-CM | POA: Diagnosis not present

## 2022-05-25 DIAGNOSIS — G3184 Mild cognitive impairment, so stated: Secondary | ICD-10-CM | POA: Diagnosis not present

## 2022-05-25 DIAGNOSIS — R202 Paresthesia of skin: Secondary | ICD-10-CM | POA: Diagnosis not present

## 2022-05-25 DIAGNOSIS — U071 COVID-19: Secondary | ICD-10-CM | POA: Diagnosis not present

## 2022-05-25 DIAGNOSIS — Z8673 Personal history of transient ischemic attack (TIA), and cerebral infarction without residual deficits: Secondary | ICD-10-CM

## 2022-05-25 DIAGNOSIS — R413 Other amnesia: Secondary | ICD-10-CM

## 2022-05-25 LAB — URINALYSIS, ROUTINE W REFLEX MICROSCOPIC
Bilirubin Urine: NEGATIVE
Hgb urine dipstick: NEGATIVE
Ketones, ur: NEGATIVE
Leukocytes,Ua: NEGATIVE
Nitrite: NEGATIVE
RBC / HPF: NONE SEEN (ref 0–?)
Specific Gravity, Urine: >=1.03 — AB (ref 1.000–1.030)
Total Protein, Urine: NEGATIVE
Urine Glucose: NEGATIVE
Urobilinogen, UA: 0.2 (ref 0.0–1.0)
pH: 6 (ref 5.0–8.0)

## 2022-05-25 LAB — CBC WITH DIFFERENTIAL/PLATELET
Basophils Absolute: 0.1 10*3/uL (ref 0.0–0.1)
Basophils Relative: 0.6 % (ref 0.0–3.0)
Eosinophils Absolute: 0.8 10*3/uL — ABNORMAL HIGH (ref 0.0–0.7)
Eosinophils Relative: 8.8 % — ABNORMAL HIGH (ref 0.0–5.0)
HCT: 42.1 % (ref 39.0–52.0)
Hemoglobin: 14.1 g/dL (ref 13.0–17.0)
Lymphocytes Relative: 18.4 % (ref 12.0–46.0)
Lymphs Abs: 1.6 10*3/uL (ref 0.7–4.0)
MCHC: 33.5 g/dL (ref 30.0–36.0)
MCV: 94.8 fl (ref 78.0–100.0)
Monocytes Absolute: 1 10*3/uL (ref 0.1–1.0)
Monocytes Relative: 11.8 % (ref 3.0–12.0)
Neutro Abs: 5.4 10*3/uL (ref 1.4–7.7)
Neutrophils Relative %: 60.4 % (ref 43.0–77.0)
Platelets: 173 10*3/uL (ref 150.0–400.0)
RBC: 4.44 Mil/uL (ref 4.22–5.81)
RDW: 13.7 % (ref 11.5–15.5)
WBC: 8.9 10*3/uL (ref 4.0–10.5)

## 2022-05-25 LAB — COMPREHENSIVE METABOLIC PANEL
ALT: 21 U/L (ref 0–53)
AST: 19 U/L (ref 0–37)
Albumin: 4 g/dL (ref 3.5–5.2)
Alkaline Phosphatase: 74 U/L (ref 39–117)
BUN: 19 mg/dL (ref 6–23)
CO2: 27 mEq/L (ref 19–32)
Calcium: 9.2 mg/dL (ref 8.4–10.5)
Chloride: 103 mEq/L (ref 96–112)
Creatinine, Ser: 1.34 mg/dL (ref 0.40–1.50)
GFR: 53.11 mL/min — ABNORMAL LOW (ref >60.00)
Glucose, Bld: 86 mg/dL (ref 70–99)
Potassium: 4.6 mEq/L (ref 3.5–5.1)
Sodium: 140 mEq/L (ref 135–145)
Total Bilirubin: 0.7 mg/dL (ref 0.2–1.2)
Total Protein: 6.9 g/dL (ref 6.0–8.3)

## 2022-05-25 LAB — HEMOGLOBIN A1C: Hgb A1c MFr Bld: 5.7 % (ref 4.6–6.5)

## 2022-05-25 MED ORDER — GABAPENTIN 100 MG PO CAPS: 100.0000 mg | ORAL_CAPSULE | Freq: Three times a day (TID) | ORAL | 1 refills | Status: DC

## 2022-05-25 NOTE — Progress Notes (Signed)
[facility] [address] [place]. [place] [address] [phone]       OFFICE FOLLOW UP NOTE  Larry Stone Date of Birth:  1950-11-21 Medical Record Number:  [id number] Take care: Referring MD: [name]  Reason for Referral: Stroke   Chief Complaint  Patient presents with   New Patient (Initial Visit)    Rm 17. Alone.       HPI:  Update 05/25/2022 : He returns for follow-up after last visit in May 2023.  He states that he has noticed worsening of paresthesias on his face as well as right hand and leg.  These are intermittent and they come and go and can last variably from few minutes to up to an hour.  He has gotten used to them.  He is also noted some decrease in his short-term memory phonation is difficult for him to remember.  His long-term memory is not as bad.  He has never gotten while driving.  He is still independent in all activities of daily living.  His primary care physician gave him a prescription for Aricept 5 mg daily for a month but he has not yet started it.  He has an appointment to see his primary care physician for follow-up lab work dated done today for his lipid profile A1c.  He has been compliant with taking his Plavix which is tolerating well without bruising or bleeding.  His blood pressure is under good control and today it is 135/75.  He is tolerating Pravachol and Zetia well without side effects.  He states his sugars are doing better now.  Update 09/16/2021 JM: Patient returns for 61-monthstroke follow-up.  He has been stable from stroke standpoint without new stroke/TIA symptoms.  Reports continued right hand and face dysesthesias. He has greater difficulty with his hand which he feels can fluctuate between feeling cold and hot.  Use of compression glove provides some relief. Continues to have difficulty with right hamstring tear - currently using tramadol which helps pain some but is still limited in activity.  This is managed by  PCP.  Compliant on Plavix, pravastatin and Zetia, denies side effects.  Blood pressure today 147/76. Monitors at home and typically 130s/70s.  Routinely follows with PCP and cardiology.  No further concerns at this time    History provided for reference purposes only Update 03/03/2021 JM: Returns for 459-monthtroke follow-up unaccompanied  Overall stable -denies new stroke/TIA symptoms Continued right hand and face post stroke pain - previously rx'd amitriptyline (for both pain and anxiety) but did not take due to interaction with bupropion.  Prior intolerance to topiramate and pregabalin.  He does continue to experience anxiety -currently on bupropion and Xanax managed by PCP Has been dealing with right hamstring tear - has been placed on tramadol due to severe pain - questions if tramadol has been helping post stroke pain some or if he hasn't been as focused on post stroke pain due to severe pain of right thigh - being followed by ortho Dr. XuErlinda HongCompliant on Plavix, pravastatin and Zetia -denies side effects Blood pressure today 115/64 Recent A1c 5.8 on metformin 500 mg daily  No new concerns at this time  Update 10/27/2020 JM: Larry Stone for 4 month stroke follow-up unaccompanied.  Stable from stroke standpoint without new stroke/TIA symptoms.  He continues to have difficulty with right face, hand and occasional foot paresthesias describing as burning and electrical shock sensation.  He has previously trialed pregabalin  and topiramate but unable to tolerate.  Previously complained worsening gait difficulty, slurred speech and cognitive impairment after initiating pregabalin which resolved shortly after discontinuing.  He is currently participating in PT for right posterior thigh pain followed by orthopedics [name].  He also reports memory changes since his stroke including increased irritability and low tolerance levels - hx of depression and anxiety currently on bupropion and Xanax  as needed per PCP. Reports compliance on Plavix, pravastatin and Zetia without associated side effects.  Blood pressure today 134/72.  No further concerns at this time.   Update 06/19/2020 JM: Larry Stone returns for stroke follow-up after prior visit approximately 5 months ago with [name].  Topiramate initiated at prior visit for poststroke paresthesias but unable to tolerate. Placed on Lyrica 50 mg twice daily beginning of November and reports approximately 2 weeks after, he was noticing increased lethargy and fluctuation of gait impairment with imbalance (getting pulled to the right) and lightheadedness, slurred speech and cognitive difficulties such as delayed responses and short-term memory difficulties.  Gait impairment with imbalance and slurred speech all present initially with stroke.  Denies new stroke/TIA symptoms.   He is unsure if Lyrica was beneficial for paresthesias as he does report some improvement since prior visit but right hand and right facial paresthesias still present. He also admits to struggling with depression with history of depression PTA on bupropion 150 mg twice daily but noticed worsening after his stroke.  He is also on Xanax as needed per PCP He has been trying to keep active and does report doing HEP daily as advised during therapy sessions  Reports compliance on Plavix and pravastatin and Zetia-denies side effects Blood pressure today 135/66  No further concerns at this time  Initial visit 02/06/2020 [name]: Larry Stone is a pleasant 72 year old Caucasian male with past medical history of hypertension, hyper lipidemia, diabetes, congestive heart failure and coronary artery disease who developed sudden onset of right lower face and hand numbness as well as imbalance and difficulty walking on 12/03/2019.  He did not seek medical help the same day as if waited for it to get better.  Next day he went to the hospital but got tired waiting in the ER since he was not seen  and instead went to see his primary physician Dr. [name] who ordered an outpatient stroke work-up.  MRI scan of the brain done on 12/20/2019 shows a subacute left thalamic lacunar infarct.  Carotid ultrasound on 12/10/2019 showed no significant extracranial stenosis.  Echocardiogram on 12/28/2018 showed slightly diminished ejection fraction of 45 to 50% but no clot or definite cardiac source of embolism was noted.  He underwent 30 days of outpatient telemetry cardiac monitoring which was negative for atrial fibrillation or significant arrhythmias.  Carotid ultrasound on 12/10/2019 was unremarkable.  LDL cholesterol was 78 mg percent on 12/26/2019.  Patient is presently on Plavix 75 mg daily which is tolerating well without bruising or bleeding.  He states his gait and balance have improved and he has finished outpatient physical and occupational therapy.  He still stumbles occasionally but as long to walk slowly and carefully.  He has had no falls or injuries.  He however still has persistent paresthesias in his right face and right hand which are bothersome and he would like to try some medications for this.  He has had no recurrent stroke or TIA symptoms.  States his blood pressure is well controlled and today it is 113/65 in office.  States  his sugars are also doing well however he cannot tell me when his last hemoglobin A1c was checked.  He has been scheduled to undergo loop recorder pending my consultation visit today.  ROS:   14 system review of systems is positive for those listed in HPI and all other systems negative  PMH:  Past Medical History:  Diagnosis Date   Allergy    Anxiety    CAD    a. acute inferior lateral wall infarction in September 2004 treated medically. b.  cath 06/17/16 showing mild nonobstructive CAD with 30-40% ostial LM (eccentric), elevated LV filling pressures and normal LV function.   Chronic diastolic CHF (congestive heart failure) (HCC)    CKD (chronic kidney disease), stage  II    COPD (chronic obstructive pulmonary disease) (HCC)    Diverticulosis    DJD (degenerative joint disease)    GERD (gastroesophageal reflux disease)    HTN (hypertension)    Hyperlipidemia    Low back pain syndrome    Myocardial infarction ([place]) 2004   Obesity    OSA (obstructive sleep apnea)    RBBB    Recurrent aspiration bronchitis/pneumonia    Recurrent aspiration pneumonia ([place])    [name] 07/13/2016   Stroke (HCC)    Thrombocytopenia (HCC)    Tubular adenoma of colon 2014    Social History:  Social History   Socioeconomic History   Marital status: Married    Spouse name: Not on file   Number of children: 1   Years of education: Not on file   Highest education level: Not on file  Occupational History   Occupation: [employer] Use   Smoking status: Former    Packs/day: 1.00    Years: 20.00    Total pack years: 20.00    Types: Cigarettes, Cigars    Quit date: 05/08/1998    Years since quitting: 24.0   Smokeless tobacco: Never   Tobacco comments:    quit in 2005  Vaping Use   Vaping Use: Never used  Substance and Sexual Activity   Alcohol use: Yes    Alcohol/week: 1.0 standard drink of alcohol    Types: 1 Standard drinks or equivalent per week    Comment: rarely   Drug use: No   Sexual activity: Yes  Other Topics Concern   Not on file  Social History Narrative   Lives with spouse only   Right Handed   Drinks <1 cup caffeine daily   Social Determinants of Health   Financial Resource Strain: Low Risk  (05/06/2022)   Overall Financial Resource Strain (CARDIA)    Difficulty of Paying Living Expenses: Not hard at all  Food Insecurity: No Food Insecurity (05/06/2022)   Hunger Vital Sign    Worried About Running Out of Food in the Last Year: Never true    Ran Out of Food in the Last Year: Never true  Transportation Needs: No Transportation Needs (05/06/2022)   PRAPARE - Hydrologist (Medical):  No    Lack of Transportation (Non-Medical): No  Physical Activity: Inactive (05/06/2022)   Exercise Vital Sign    Days of Exercise per Week: 0 days    Minutes of Exercise per Session: 0 min  Stress: No Stress Concern Present (05/06/2022)   [place]    Feeling of Stress : Not at all  Social Connections: Unknown (05/06/2022)   Social Connection and Isolation Panel [NHANES]  Frequency of Communication with Friends and Family: More than three times a week    Frequency of Social Gatherings with Friends and Family: More than three times a week    Attends Religious Services: Patient refused    Active Member of Clubs or Organizations: Patient refused    Attends Archivist Meetings: Patient refused    Marital Status: Married  Human resources officer Violence: Not At Risk (05/06/2022)   Humiliation, Afraid, Rape, and Kick questionnaire    Fear of Current or Ex-Partner: No    Emotionally Abused: No    Physically Abused: No    Sexually Abused: No    Medications:   Current Outpatient Medications on File Prior to Visit  Medication Sig Dispense Refill   ALPRAZolam (XANAX) 0.5 MG tablet TAKE 1 TABLET BY MOUTH THREE TIMES A DAY AS NEEDED 270 tablet 0   buPROPion (WELLBUTRIN SR) 150 MG 12 hr tablet TAKE 1 TABLET BY MOUTH TWICE A DAY 180 tablet 3   carvedilol (COREG) 25 MG tablet TAKE 1 TABLET BY MOUTH 2 TIMES DAILY WITH A MEAL. 180 tablet 3   clopidogrel (PLAVIX) 75 MG tablet Take 1 tablet (75 mg total) by mouth daily. 90 tablet 2   clotrimazole-betamethasone (LOTRISONE) cream APPLY AS DIRECTED 45 g 11   donepezil (ARICEPT) 5 MG tablet Take 1 tablet (5 mg total) by mouth at bedtime. 30 tablet 5   ezetimibe (ZETIA) 10 MG tablet Take 1 tablet (10 mg total) by mouth daily. 90 tablet 3   FLUoxetine (PROZAC) 10 MG tablet Take 1 tablet (10 mg total) by mouth daily. 30 tablet 5   Fluticasone-Umeclidin-Vilant (TRELEGY ELLIPTA) 100-62.5-25  MCG/ACT AEPB Inhale 1 puff into the lungs daily. (Patient taking differently: Inhale 1 puff into the lungs as needed.) 1 each 11   hydrALAZINE (APRESOLINE) 50 MG tablet TAKE 1 TABLET BY MOUTH 4 TIMES DAILY. 360 tablet 3   ipratropium-albuterol (DUONEB) 0.5-2.5 (3) MG/3ML SOLN Take 3 mLs by nebulization every 4 (four) hours as needed (every 4-6 hours prn). Take by nebulization every 4-6 hours as needed 360 mL 2   meclizine (ANTIVERT) 25 MG tablet Take 0.5 tablets (12.5 mg total) by mouth 3 (three) times daily as needed for dizziness. 30 tablet 0   metFORMIN (GLUCOPHAGE) 500 MG tablet TAKE 1 TABLET BY MOUTH DAILY WITH BREAKFAST. 90 tablet 1   montelukast (SINGULAIR) 10 MG tablet TAKE 1 TABLET BY MOUTH EVERYDAY AT BEDTIME 90 tablet 3   nitroGLYCERIN (NITROSTAT) 0.4 MG SL tablet Place 1 tablet (0.4 mg total) under the tongue every 5 (five) minutes as needed for chest pain. Up to 3 doses. 75 tablet 2   pravastatin (PRAVACHOL) 40 MG tablet TAKE 1 TABLET BY MOUTH EVERY DAY 90 tablet 3   traMADol (ULTRAM) 50 MG tablet TAKE 1 TABLET BY MOUTH EVERY 6 HOURS AS NEEDED 120 tablet 1   No current facility-administered medications on file prior to visit.    Allergies:   Allergies  Allergen Reactions   Ace Inhibitors Swelling   Angiotensin Receptor Blockers Swelling   Aspirin Hives   Amlodipine     Leg swelling   Crestor [Rosuvastatin Calcium]     myalgias   Cymbalta [Duloxetine Hcl]     Dizzy    Lipitor [Atorvastatin Calcium]     arthralgias   Codeine Nausea And Vomiting   Irbesartan Other (See Comments)    REACTION: allergic to ARBs w/ angioedema   Ramipril Other (See Comments)    REACTION: Allergic  to ACE's w/ angioedema...    Physical Exam Today's Vitals   05/25/22 0847  BP: 135/75  Pulse: 60  Weight: 190 lb (86.2 kg)  Height: '5\' 9"'$  (1.753 m)   Body mass index is 28.06 kg/m.    General: well developed, well nourished pleasant elderly Caucasian male, seated, in no evident  distress Head: head normocephalic and atraumatic.   Neck: supple with no carotid or supraclavicular bruits Cardiovascular: regular rate and rhythm, no murmurs Musculoskeletal: no deformity Skin:  no rash/petichiae Vascular:  Normal pulses all extremities  Neurologic Exam Mental Status: Awake and fully alert.  Fluent speech and language. Oriented to place and time. Recent and remote memory intact. Attention span, concentration and fund of knowledge appropriate during visit. Mood and affect appropriate.  Mini-Mental status exam scored 28/30 with 1 deficits in recall and drawing.  Able to name 15 animals which can walk on 4 legs.  Clock drawing 4/4. Cranial Nerves: Pupils equal, briskly reactive to light. Extraocular movements full without nystagmus. Visual fields full to confrontation. Hearing diminished despite hearing aid. Facial sensation intact.  Mild right nasolabial fold flattening.  Tongue, palate moves normally and symmetrically.  Motor: Normal bulk and tone. Normal strength in all tested extremity muscles. Sensory.: intact to touch , pinprick , position and vibratory sensation -subjective paresthesias in the right face and hand. Coordination: Rapid alternating movements normal in all extremities. Finger-to-nose and heel-to-shin performed accurately bilaterally. Gait and Station: Arises from chair without difficulty. Stance is normal. Gait demonstrates normal stride length and balance without use of assistive device.  Mild to moderate difficulty with heel, toe and tandem walk.   Reflexes: 1+ and symmetric. Toes downgoing.       05/25/2022    8:47 AM  MMSE - Mini Mental State Exam  Orientation to time 5  Orientation to Place 5  Registration 3  Attention/ Calculation 5  Recall 2  Language- name 2 objects 2  Language- repeat 1  Language- follow 3 step command 3  Language- read & follow direction 1  Write a sentence 1  Copy design 0  Total score 28       ASSESSMENT/PLAN:  72 year old Caucasian male with left thalamic stroke in August 2021 from small vessel disease with residual right face and hand dysesthesias which appear to be bothersome now..  Vascular risk factors of hypertension hyperlipidemia coronary artery disease.  He also has new complaints of memory loss likely due to mild cognitive impairment.     I had a long discussion with the patient regarding his close stroke paresthesias and recommend trial of gabapentin 100 mg twice daily for a week to be increased to 3 times daily if tolerated without side effects.  Continue Plavix for stroke prevention and maintain aggressive risk factor modification with strict control of hypertension with blood pressure goal below 130/90, lipids with LDL goal below 70 mg percent and diabetes with hemoglobin A1c goal below 6.5%.  Check screening follow-up carotid ultrasound study.  He also has mild cognitive impairment and I recommend he increase participation in cognitively challenging activities like solving crossword puzzles, playing bridge and sudoku and word searches.  We also discussed memory compensation strategies.  He wants to hold off on starting Aricept at the present time since his deficits are quite minor.  Return for follow-up in the future in 6 months with nurse practitioner or call earlier if necessary.  Greater than 50% time during this 40-minute visit was spent on counseling and coordination of care  about his disabling poststroke paresthesias and new complaint of memory loss and mild cognitive impairment.  Antony Contras, MD  [facility] [address] [place], [place] [address]  Phone [phone] Fax [phone] Note: This document was prepared with digital dictation and possible smart phrase technology. Any transcriptional errors that result from this process are unintentional.

## 2022-05-25 NOTE — Telephone Encounter (Signed)
[  9:50 AM] Clodfelter, Tammy goodmorning, i have Larry Stone 1951-01-13 down here he wants to add a urine test. he requested a urine bc he said last time something was abnormal so he rather have a urine test to be on the safe side  MD states ok for UA. Place order../l,mb

## 2022-05-25 NOTE — Patient Instructions (Addendum)
I had a long discussion with the patient regarding his close stroke paresthesias and recommend trial of gabapentin 100 mg twice daily for a week to be increased to 3 times daily if tolerated without side effects.  Continue Plavix for stroke prevention and maintain aggressive risk factor modification with strict control of hypertension with blood pressure goal below 130/90, lipids with LDL goal below 70 mg percent and diabetes with hemoglobin A1c goal below 6.5%.  Check screening follow-up carotid ultrasound study.  He also has mild cognitive impairment and I recommend he increase participation in cognitively challenging activities like solving crossword puzzles, playing bridge and sudoku and word searches.  We also discussed memory compensation strategies.  He wants to hold off on starting Aricept at the present time since his deficits are quite minor.  Return for follow-up in the future in 6 months with nurse practitioner or call earlier if necessary.  Memory Compensation Strategies  Use "WARM" strategy.  W= write it down  A= associate it  R= repeat it  M= make a mental note  2.   You can keep a Social worker.  Use a 3-ring notebook with sections for the following: calendar, important names and phone numbers,  medications, doctors' names/phone numbers, lists/reminders, and a section to journal what you did  each day.   3.    Use a calendar to write appointments down.  4.    Write yourself a schedule for the day.  This can be placed on the calendar or in a separate section of the Memory Notebook.  Keeping a  regular schedule can help memory.  5.    Use medication organizer with sections for each day or morning/evening pills.  You may need help loading it  6.    Keep a basket, or pegboard by the door.  Place items that you need to take out with you in the basket or on the pegboard.  You may also want to  include a message board for reminders.  7.    Use sticky notes.  Place sticky notes  with reminders in a place where the task is performed.  For example: " turn off the  stove" placed by the stove, "lock the door" placed on the door at eye level, " take your medications" on  the bathroom mirror or by the place where you normally take your medications.  8.    Use alarms/timers.  Use while cooking to remind yourself to check on food or as a reminder to take your medicine, or as a  reminder to make a call, or as a reminder to perform another task, etc.

## 2022-05-27 ENCOUNTER — Ambulatory Visit: Payer: Medicare Other | Admitting: Internal Medicine

## 2022-05-27 ENCOUNTER — Encounter: Payer: Self-pay | Admitting: Internal Medicine

## 2022-05-27 VITALS — BP 110/60 | HR 55 | Temp 98.6°F | Ht 69.0 in | Wt 195.0 lb

## 2022-05-27 DIAGNOSIS — R739 Hyperglycemia, unspecified: Secondary | ICD-10-CM | POA: Diagnosis not present

## 2022-05-27 DIAGNOSIS — N183 Chronic kidney disease, stage 3 unspecified: Secondary | ICD-10-CM

## 2022-05-27 DIAGNOSIS — F419 Anxiety disorder, unspecified: Secondary | ICD-10-CM | POA: Diagnosis not present

## 2022-05-27 DIAGNOSIS — F09 Unspecified mental disorder due to known physiological condition: Secondary | ICD-10-CM

## 2022-05-27 MED ORDER — FLUOXETINE HCL 20 MG PO CAPS: 20.0000 mg | ORAL_CAPSULE | Freq: Every day | ORAL | 5 refills | Status: DC

## 2022-05-27 MED ORDER — TAMSULOSIN HCL 0.4 MG PO CAPS: 0.4000 mg | ORAL_CAPSULE | Freq: Every day | ORAL | 11 refills | Status: DC

## 2022-05-27 NOTE — Assessment & Plan Note (Signed)
Cont with Coreg, Hydralazine Hydrate well

## 2022-05-27 NOTE — Assessment & Plan Note (Signed)
Monitor A1c 

## 2022-05-27 NOTE — Progress Notes (Signed)
Subjective:  Patient ID: Larry Stone, male    DOB: 1950/06/14  Age: 72 y.o. MRN: [id number]  CC: Follow-up   HPI Larry Stone presents for CVA, OA, dyslipidemia, depression  C/o being moody  Outpatient Medications Prior to Visit  Medication Sig Dispense Refill   ALPRAZolam (XANAX) 0.5 MG tablet TAKE 1 TABLET BY MOUTH THREE TIMES A DAY AS NEEDED 270 tablet 1   buPROPion (WELLBUTRIN SR) 150 MG 12 hr tablet TAKE 1 TABLET BY MOUTH TWICE A DAY 180 tablet 3   carvedilol (COREG) 25 MG tablet TAKE 1 TABLET BY MOUTH 2 TIMES DAILY WITH A MEAL. 180 tablet 3   clopidogrel (PLAVIX) 75 MG tablet Take 1 tablet (75 mg total) by mouth daily. 90 tablet 2   clotrimazole-betamethasone (LOTRISONE) cream APPLY AS DIRECTED 45 g 11   donepezil (ARICEPT) 5 MG tablet Take 1 tablet (5 mg total) by mouth at bedtime. 30 tablet 5   ezetimibe (ZETIA) 10 MG tablet Take 1 tablet (10 mg total) by mouth daily. 90 tablet 3   Fluticasone-Umeclidin-Vilant (TRELEGY ELLIPTA) 100-62.5-25 MCG/ACT AEPB Inhale 1 puff into the lungs daily. (Patient taking differently: Inhale 1 puff into the lungs as needed.) 1 each 11   gabapentin (NEURONTIN) 100 MG capsule Take 1 capsule (100 mg total) by mouth 3 (three) times daily. Start 1 capsule twice daily x 1 week and then three times daily 270 capsule 1   hydrALAZINE (APRESOLINE) 50 MG tablet TAKE 1 TABLET BY MOUTH 4 TIMES DAILY. 360 tablet 3   ipratropium-albuterol (DUONEB) 0.5-2.5 (3) MG/3ML SOLN Take 3 mLs by nebulization every 4 (four) hours as needed (every 4-6 hours prn). Take by nebulization every 4-6 hours as needed 360 mL 2   meclizine (ANTIVERT) 25 MG tablet Take 0.5 tablets (12.5 mg total) by mouth 3 (three) times daily as needed for dizziness. 30 tablet 0   metFORMIN (GLUCOPHAGE) 500 MG tablet TAKE 1 TABLET BY MOUTH DAILY WITH BREAKFAST. 90 tablet 1   montelukast (SINGULAIR) 10 MG tablet TAKE 1 TABLET BY MOUTH EVERYDAY AT BEDTIME 90 tablet 3   nitroGLYCERIN (NITROSTAT) 0.4  MG SL tablet Place 1 tablet (0.4 mg total) under the tongue every 5 (five) minutes as needed for chest pain. Up to 3 doses. 75 tablet 2   pravastatin (PRAVACHOL) 40 MG tablet TAKE 1 TABLET BY MOUTH EVERY DAY 90 tablet 3   traMADol (ULTRAM) 50 MG tablet TAKE 1 TABLET BY MOUTH EVERY 6 HOURS AS NEEDED 120 tablet 1   FLUoxetine (PROZAC) 10 MG tablet Take 1 tablet (10 mg total) by mouth daily. 30 tablet 5   No facility-administered medications prior to visit.    ROS: Review of Systems  Constitutional:  Negative for appetite change, fatigue and unexpected weight change.  HENT:  Negative for congestion, nosebleeds, sneezing, sore throat and trouble swallowing.   Eyes:  Negative for itching and visual disturbance.  Respiratory:  Negative for cough.   Cardiovascular:  Negative for chest pain, palpitations and leg swelling.  Gastrointestinal:  Negative for abdominal distention, blood in stool, diarrhea and nausea.  Genitourinary:  Negative for frequency and hematuria.  Musculoskeletal:  Positive for gait problem. Negative for back pain, joint swelling and neck pain.  Skin:  Negative for rash.  Neurological:  Negative for dizziness, tremors, speech difficulty and weakness.  Psychiatric/Behavioral:  Positive for decreased concentration and dysphoric mood. Negative for agitation, sleep disturbance and suicidal ideas. The patient is nervous/anxious.     Objective:  BP 110/60 (BP Location: Right Arm, Patient Position: Sitting, Cuff Size: Normal)   Pulse (!) 55   Temp 98.6 F (37 C) (Oral)   Ht '5\' 9"'$  (1.753 m)   Wt 195 lb (88.5 kg)   SpO2 98%   BMI 28.80 kg/m   BP Readings from Last 3 Encounters:  05/27/22 110/60  05/25/22 135/75  04/06/22 122/76    Wt Readings from Last 3 Encounters:  05/27/22 195 lb (88.5 kg)  05/25/22 190 lb (86.2 kg)  05/06/22 195 lb (88.5 kg)    Physical Exam Constitutional:      General: He is not in acute distress.    Appearance: He is well-developed.      Comments: NAD  Eyes:     Conjunctiva/sclera: Conjunctivae normal.     Pupils: Pupils are equal, round, and reactive to light.  Neck:     Thyroid: No thyromegaly.     Vascular: No JVD.  Cardiovascular:     Rate and Rhythm: Normal rate and regular rhythm.     Heart sounds: Normal heart sounds. No murmur heard.    No friction rub. No gallop.  Pulmonary:     Effort: Pulmonary effort is normal. No respiratory distress.     Breath sounds: Normal breath sounds. No wheezing or rales.  Chest:     Chest wall: No tenderness.  Abdominal:     General: Bowel sounds are normal. There is no distension.     Palpations: Abdomen is soft. There is no mass.     Tenderness: There is no abdominal tenderness. There is no guarding or rebound.  Musculoskeletal:        General: No tenderness. Normal range of motion.     Cervical back: Normal range of motion.  Lymphadenopathy:     Cervical: No cervical adenopathy.  Skin:    General: Skin is warm and dry.     Findings: No rash.  Neurological:     Mental Status: He is alert and oriented to person, place, and time.     Cranial Nerves: No cranial nerve deficit.     Motor: No abnormal muscle tone.     Coordination: Coordination normal.     Gait: Gait normal.     Deep Tendon Reflexes: Reflexes are normal and symmetric.  Psychiatric:        Behavior: Behavior normal.        Thought Content: Thought content normal.        Judgment: Judgment normal.     Lab Results  Component Value Date   WBC 8.9 05/25/2022   HGB 14.1 05/25/2022   HCT 42.1 05/25/2022   PLT 173.0 05/25/2022   GLUCOSE 86 05/25/2022   CHOL 132 01/11/2022   TRIG 86.0 01/11/2022   HDL 47.90 01/11/2022   LDLCALC 67 01/11/2022   ALT 21 05/25/2022   AST 19 05/25/2022   NA 140 05/25/2022   K 4.6 05/25/2022   CL 103 05/25/2022   CREATININE 1.34 05/25/2022   BUN 19 05/25/2022   CO2 27 05/25/2022   TSH 2.27 01/11/2022   PSA 0.33 01/11/2022   INR 1.1 12/04/2019   HGBA1C 5.7 05/25/2022     MR Lumbar Spine w/o contrast  Result Date: 12/06/2020 CLINICAL DATA:  Lumbar radiculopathy. EXAM: MRI LUMBAR SPINE WITHOUT CONTRAST TECHNIQUE: Multiplanar, multisequence MR imaging of the lumbar spine was performed. No intravenous contrast was administered. COMPARISON:  None. FINDINGS: Segmentation:  Standard. Alignment:  2 mm retrolisthesis of L2 on L3, L3 on  L4 and L4 on L5. Vertebrae: No acute fracture, evidence of discitis, or aggressive bone lesion. Conus medullaris and cauda equina: Conus extends to the L1 level. Conus and cauda equina appear normal. Paraspinal and other soft tissues: No acute paraspinal abnormality. Disc levels: Disc spaces: Degenerative disease with disc height loss at T12-L1, L1-2, L2-3, L3-4 and L4-5. T12-L1: No significant disc bulge. No neural foraminal stenosis. No central canal stenosis. L1-L2: No disc protrusion. Mild left foraminal stenosis. No right foraminal stenosis. Mild bilateral facet arthropathy. L2-L3: Mild broad-based disc bulge. Moderate bilateral facet arthropathy. Mild spinal stenosis. Moderate left and mild right foraminal stenosis. L3-L4: Mild broad-based disc bulge. Mild bilateral facet arthropathy. Bilateral lateral recess stenosis. Mild right and moderate left foraminal stenosis. No central canal stenosis. L4-L5: Broad-based disc bulge. Moderate bilateral facet arthropathy. Moderate right foraminal stenosis. No left foraminal stenosis. No spinal stenosis. L5-S1: Mild broad-based disc bulge. Moderate bilateral facet arthropathy, right worse than left. Reactive marrow edema involving the right facet joint. Right L5 pars interarticularis defect. Mild right foraminal stenosis. No left foraminal stenosis. IMPRESSION: 1. Lumbar spine spondylosis as described above. 2. No acute osseous injury of the lumbar spine. Electronically Signed   By: Kathreen Devoid M.D.   On: 12/06/2020 08:22   MR QMGQQ RIGHT WO CONTRAST  Result Date: 12/05/2020 CLINICAL DATA:  Posterior  right thigh pain for several months. Stress fracture suspected. EXAM: MRI OF THE RIGHT FEMUR WITHOUT CONTRAST TECHNIQUE: Multiplanar, multisequence MR imaging of the right femur was performed. No intravenous contrast was administered. COMPARISON:  X-ray 05/31/2017 FINDINGS: Bones/Joint/Cartilage No acute fracture. No dislocation. No significant bone marrow edema. No periostitis. No suspicious marrow replacing bone lesion. Small bilateral knee joint effusions are evident. Evaluation for internal derangement is limited on large field-of-view imaging. Ligaments Intact. Muscles and Tendons Complete tear of the hamstring component of the adductor magnus tendon origin from the ischial tuberosity (series 8, images 11-13; series 7, image 19) with approximately 2.4 cm retraction with fluid-filled gap. Tendinosis with low-grade partial tearing of the semimembranosus tendon as well as the conjoint tendon of the long head biceps femoris and semitendinosus without a full-thickness or retracted component. Intramuscular edema within the proximal hamstring compartment. Origins of the contralateral left hamstring tendons appear intact on limited view. Soft tissues No well-defined hematoma. No mass identified within the soft tissues. Small right and trace left hydroceles. IMPRESSION: 1. Complete tear of the hamstring component of the adductor magnus tendon origin from the ischial tuberosity with approximately 2.4 cm retraction with fluid-filled gap. 2. Tendinosis with low-grade partial tearing of the semimembranosus tendon as well as the conjoint tendon of the long head biceps femoris and semitendinosus without a full-thickness or retracted component. Electronically Signed   By: Davina Poke D.O.   On: 12/05/2020 15:59    Assessment & Plan:   Problem List Items Addressed This Visit       Nervous and Auditory   Mild cognitive disorder - Primary    In depth testing was offered Ok to start Aricept        Genitourinary    Chronic renal insufficiency, stage 3 (moderate) (HCC)    Cont with Coreg, Hydralazine Hydrate well        Other   Hyperglycemia    Monitor A1c      Anxiety disorder    Increase Fluoxetine to 20 mg/d      Relevant Medications   FLUoxetine (PROZAC) 20 MG capsule      Meds ordered this  encounter  Medications   tamsulosin (FLOMAX) 0.4 MG CAPS capsule    Sig: Take 1 capsule (0.4 mg total) by mouth daily.    Dispense:  30 capsule    Refill:  11   FLUoxetine (PROZAC) 20 MG capsule    Sig: Take 1 capsule (20 mg total) by mouth daily.    Dispense:  30 capsule    Refill:  5      Follow-up: Return in about 6 weeks (around 07/08/2022) for a follow-up visit.  [name], MD

## 2022-05-27 NOTE — Assessment & Plan Note (Signed)
Increase Fluoxetine to 20 mg/d

## 2022-05-27 NOTE — Assessment & Plan Note (Addendum)
In depth testing was offered Ok to start Aricept

## 2022-06-02 ENCOUNTER — Ambulatory Visit (HOSPITAL_COMMUNITY)
Admission: RE | Admit: 2022-06-02 | Discharge: 2022-06-02 | Disposition: A | Discharge status: Home / Self Care | Payer: Medicare Other | Source: Ambulatory Visit | Attending: Neurology | Admitting: Neurology

## 2022-06-02 ENCOUNTER — Other Ambulatory Visit: Payer: Self-pay | Admitting: Internal Medicine

## 2022-06-02 DIAGNOSIS — Z8673 Personal history of transient ischemic attack (TIA), and cerebral infarction without residual deficits: Secondary | ICD-10-CM | POA: Insufficient documentation

## 2022-06-02 NOTE — Progress Notes (Signed)
Kindly inform the patient that carotid ultrasound study showed no significant blockage of either carotid artery in the neck.

## 2022-06-03 ENCOUNTER — Ambulatory Visit: Payer: Medicare Other | Attending: Cardiovascular Disease | Admitting: Cardiovascular Disease

## 2022-06-03 ENCOUNTER — Encounter: Payer: Self-pay | Admitting: Cardiovascular Disease

## 2022-06-03 VITALS — BP 130/90 | HR 51 | Ht 69.0 in | Wt 195.6 lb

## 2022-06-03 DIAGNOSIS — I5032 Chronic diastolic (congestive) heart failure: Secondary | ICD-10-CM

## 2022-06-03 DIAGNOSIS — I10 Essential (primary) hypertension: Secondary | ICD-10-CM | POA: Diagnosis not present

## 2022-06-03 DIAGNOSIS — E785 Hyperlipidemia, unspecified: Secondary | ICD-10-CM

## 2022-06-03 DIAGNOSIS — I2510 Atherosclerotic heart disease of native coronary artery without angina pectoris: Secondary | ICD-10-CM

## 2022-06-03 NOTE — Patient Instructions (Signed)
Medication Instructions:  No changes *If you need a refill on your cardiac medications before your next appointment, please call your pharmacy*   Lab Work: none If you have labs (blood work) drawn today and your tests are completely normal, you will receive your results only by: MyChart Message (if you have MyChart) OR A paper copy in the mail If you have any lab test that is abnormal or we need to change your treatment, we will call you to review the results.   Testing/Procedures: none   Follow-Up: At [facility], you and your health needs are our priority.  As part of our continuing mission to provide you with exceptional heart care, we have created designated Provider Care Teams.  These Care Teams include your primary Cardiologist (physician) and Advanced Practice Providers (APPs -  Physician Assistants and Nurse Practitioners) who all work together to provide you with the care you need, when you need it.   Your next appointment:   12 month(s)  Provider:   Christopher McAlhany, MD      

## 2022-06-03 NOTE — Progress Notes (Signed)
Chief Complaint  Patient presents with   Follow-up    CAD    History of Present Illness: 72 yo male with history of CAD, HLD, HTN, sleep apnea and chronic diastolic CHF here today for cardiac follow up. 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 Stress myoview in June 2015 with inferolateral scar and no ischemia. When I saw him in February 2018 he was c/o dyspnea and fatigue. Cardiac cath on 06/17/16 with 40% left main stenosis, mild disease in the diagonal and proximal circumflex. He has history of myalgias with Lipitor and Crestor so he has been on Pravastatin. He is allergic to Ace-inhibitors and ARBs and has has lower extremity edema with Norvasc. He was started on hydralazine in May of 2020. We have discussed Repatha but he has not wished to start due to cost. He had a stroke mid August 2021. He left the ED waiting area before being seen by a physician. EKG with sinus that night. Dr. [name] saw him in the office and arrange a head CT and brain MRI which confirmed the stroke. Carotid artery dopplers August 2021 with mild bilateral carotid artery disease. Echo September 2021 with LVEF=45-50%, mild LVH, no valve disease. Cardiac monitor October 2021 with sinus, PVCs. Most recent carotid artery dopplers February 2024 with minimal bilateral carotid artery plaque.   He is here today for follow up. The patient denies any chest pain, dyspnea, palpitations, lower extremity edema, orthopnea, PND, dizziness, near syncope or syncope.   Primary Care Physician: Cassandria Anger, MD  Past Medical History:  Diagnosis Date   Allergy    Anxiety    CAD    a. acute inferior lateral wall infarction in September 2004 treated medically. b.  cath 06/17/16 showing mild nonobstructive CAD with 30-40% ostial LM (eccentric), elevated LV filling pressures and normal LV function.   Chronic diastolic CHF (congestive heart failure)  (HCC)    CKD (chronic kidney disease), stage II    COPD (chronic obstructive pulmonary disease) (HCC)    Diverticulosis    DJD (degenerative joint disease)    GERD (gastroesophageal reflux disease)    HTN (hypertension)    Hyperlipidemia    Low back pain syndrome    Myocardial infarction ([place]) 2004   Obesity    OSA (obstructive sleep apnea)    RBBB    Recurrent aspiration bronchitis/pneumonia    Recurrent aspiration pneumonia ([place])    [name] 07/13/2016   Stroke ([place])    Thrombocytopenia (HCC)    Tubular adenoma of colon 2014    Past Surgical History:  Procedure Laterality Date   CARDIAC CATHETERIZATION     LEFT HEART CATH AND CORONARY ANGIOGRAPHY N/A 06/17/2016   Procedure: Left Heart Cath and Coronary Angiography;  Surgeon: Burnell Blanks, MD;  Location: [facility];  Service: Cardiovascular;  Laterality: N/A;   UVULOPALATOPHARYNGOPLASTY     surgery for OSA    Current Outpatient Medications  Medication Sig Dispense Refill   ALPRAZolam (XANAX) 0.5 MG tablet TAKE 1 TABLET BY MOUTH THREE TIMES A DAY AS NEEDED 270 tablet 1   buPROPion (WELLBUTRIN SR) 150 MG 12 hr tablet TAKE 1 TABLET BY MOUTH TWICE A DAY 180 tablet 3   carvedilol (COREG) 25 MG tablet TAKE 1 TABLET BY MOUTH 2 TIMES DAILY WITH A MEAL. 180 tablet 3   clopidogrel (PLAVIX) 75 MG tablet Take 1 tablet (75 mg total)  by mouth daily. 90 tablet 2   clotrimazole-betamethasone (LOTRISONE) cream APPLY AS DIRECTED 45 g 11   ezetimibe (ZETIA) 10 MG tablet Take 1 tablet (10 mg total) by mouth daily. 90 tablet 3   FLUoxetine (PROZAC) 20 MG capsule Take 1 capsule (20 mg total) by mouth daily. 30 capsule 5   Fluticasone-Umeclidin-Vilant (TRELEGY ELLIPTA) 100-62.5-25 MCG/ACT AEPB Inhale 1 puff into the lungs daily. (Patient taking differently: Inhale 1 puff into the lungs as needed.) 1 each 11   hydrALAZINE (APRESOLINE) 50 MG tablet TAKE 1 TABLET BY MOUTH 4 TIMES DAILY. 360 tablet 3   ipratropium-albuterol (DUONEB)  0.5-2.5 (3) MG/3ML SOLN Take 3 mLs by nebulization every 4 (four) hours as needed (every 4-6 hours prn). Take by nebulization every 4-6 hours as needed 360 mL 2   meclizine (ANTIVERT) 25 MG tablet Take 0.5 tablets (12.5 mg total) by mouth 3 (three) times daily as needed for dizziness. 30 tablet 0   metFORMIN (GLUCOPHAGE) 500 MG tablet TAKE 1 TABLET BY MOUTH DAILY WITH BREAKFAST. 90 tablet 1   montelukast (SINGULAIR) 10 MG tablet TAKE 1 TABLET BY MOUTH EVERYDAY AT BEDTIME 90 tablet 3   nitroGLYCERIN (NITROSTAT) 0.4 MG SL tablet Place 1 tablet (0.4 mg total) under the tongue every 5 (five) minutes as needed for chest pain. Up to 3 doses. 75 tablet 2   pravastatin (PRAVACHOL) 40 MG tablet TAKE 1 TABLET BY MOUTH EVERY DAY 90 tablet 3   tamsulosin (FLOMAX) 0.4 MG CAPS capsule Take 1 capsule (0.4 mg total) by mouth daily. 30 capsule 11   traMADol (ULTRAM) 50 MG tablet TAKE 1 TABLET BY MOUTH EVERY 6 HOURS AS NEEDED 120 tablet 1   donepezil (ARICEPT) 5 MG tablet Take 1 tablet (5 mg total) by mouth at bedtime. (Patient not taking: Reported on 06/03/2022) 30 tablet 5   No current facility-administered medications for this visit.    Allergies  Allergen Reactions   Ace Inhibitors Swelling   Angiotensin Receptor Blockers Swelling   Aspirin Hives   Amlodipine     Leg swelling   Crestor [Rosuvastatin Calcium]     myalgias   Cymbalta [Duloxetine Hcl]     Dizzy    Lipitor [Atorvastatin Calcium]     arthralgias   Codeine Nausea And Vomiting   Irbesartan Other (See Comments)    REACTION: allergic to ARBs w/ angioedema   Ramipril Other (See Comments)    REACTION: Allergic to ACE's w/ angioedema...    Social History   Socioeconomic History   Marital status: Married    Spouse name: Not on file   Number of children: 1   Years of education: Not on file   Highest education level: Not on file  Occupational History   Occupation: [employer]  Tobacco Use   Smoking status:  Former    Packs/day: 1.00    Years: 20.00    Total pack years: 20.00    Types: Cigarettes, Cigars    Quit date: 05/08/1998    Years since quitting: 24.0   Smokeless tobacco: Never   Tobacco comments:    quit in 2005  Vaping Use   Vaping Use: Never used  Substance and Sexual Activity   Alcohol use: Yes    Alcohol/week: 1.0 standard drink of alcohol    Types: 1 Standard drinks or equivalent per week    Comment: rarely   Drug use: No   Sexual activity: Yes  Other Topics Concern   Not on file  Social History Narrative   Lives with spouse only   Right Handed   Drinks <1 cup caffeine daily   Social Determinants of Health   Financial Resource Strain: Low Risk  (05/06/2022)   Overall Financial Resource Strain (CARDIA)    Difficulty of Paying Living Expenses: Not hard at all  Food Insecurity: No Food Insecurity (05/06/2022)   Hunger Vital Sign    Worried About Running Out of Food in the Last Year: Never true    Ran Out of Food in the Last Year: Never true  Transportation Needs: No Transportation Needs (05/06/2022)   PRAPARE - Hydrologist (Medical): No    Lack of Transportation (Non-Medical): No  Physical Activity: Inactive (05/06/2022)   Exercise Vital Sign    Days of Exercise per Week: 0 days    Minutes of Exercise per Session: 0 min  Stress: No Stress Concern Present (05/06/2022)   [place]    Feeling of Stress : Not at all  Social Connections: Unknown (05/06/2022)   Social Connection and Isolation Panel [NHANES]    Frequency of Communication with Friends and Family: More than three times a week    Frequency of Social Gatherings with Friends and Family: More than three times a week    Attends Religious Services: Patient refused    Active Member of Clubs or Organizations: Patient refused    Attends Archivist Meetings: Patient refused    Marital Status: Married  Arboriculturist Violence: Not At Risk (05/06/2022)   Humiliation, Afraid, Rape, and Kick questionnaire    Fear of Current or Ex-Partner: No    Emotionally Abused: No    Physically Abused: No    Sexually Abused: No    Family History  Problem Relation Age of Onset   Stroke Mother    Heart disease Mother    Colon cancer Father    Hypertension Sister    Diabetes Sister    Colon cancer Paternal Uncle    Stomach cancer Neg Hx    Esophageal cancer Neg Hx    Pancreatic cancer Neg Hx     Review of Systems:  As stated in the HPI and otherwise negative.   BP (!) 130/90   Pulse (!) 51   Ht 5' 9"$  (1.753 m)   Wt 88.7 kg   SpO2 96%   BMI 28.89 kg/m   Physical Examination:  General: Well developed, well nourished, NAD  HEENT: OP clear, mucus membranes moist  SKIN: warm, dry. No rashes. Neuro: No focal deficits  Musculoskeletal: Muscle strength 5/5 all ext  Psychiatric: Mood and affect normal  Neck: No JVD, no carotid bruits, no thyromegaly, no lymphadenopathy.  Lungs:Clear bilaterally, no wheezes, rhonci, crackles Cardiovascular: Regular rate and rhythm. No murmurs, gallops or rubs. Abdomen:Soft. Bowel sounds present. Non-tender.  Extremities: No lower extremity edema. Pulses are 2 + in the bilateral DP/PT.  Echo September 2021:  1. Left ventricular ejection fraction, by estimation, is 45 to 50%. The  left ventricle has mildly decreased function. The left ventricle  demonstrates regional wall motion abnormalities (see scoring  diagram/findings for description). There is mild  concentric left ventricular hypertrophy. Left ventricular diastolic  parameters are consistent with Grade I diastolic dysfunction (impaired  relaxation). There is moderate hypokinesis of the left ventricular,  basal-mid inferolateral wall.   2. Right ventricular systolic function is normal. The right ventricular  size is normal.   3. The mitral  valve is normal in structure. No evidence of mitral valve   regurgitation.   4. The aortic valve is tricuspid. Aortic valve regurgitation is not  visualized. No aortic stenosis is present.  Cardiac cath 06/17/16: Ost Cx to Mid Cx lesion, 20 %stenosed. Ost LM lesion, 40 %stenosed. 1st Diag lesion, 40 %stenosed. The left ventricular systolic function is normal. LV end diastolic pressure is normal. The left ventricular ejection fraction is greater than 65% by visual estimate. There is no mitral valve regurgitation.   EKG:  EKG is ordered today. The ekg ordered today demonstrates  Sinus brady, rate 51 bpm. RBBB, inferior Q waves  Recent Labs: 01/11/2022: TSH 2.27 05/25/2022: ALT 21; BUN 19; Creatinine, Ser 1.34; Hemoglobin 14.1; Platelets 173.0; Potassium 4.6; Sodium 140   Lipid Panel    Component Value Date/Time   CHOL 132 01/11/2022 1251   CHOL 144 06/03/2021 0922   TRIG 86.0 01/11/2022 1251   HDL 47.90 01/11/2022 1251   HDL 60 06/03/2021 0922   CHOLHDL 3 01/11/2022 1251   VLDL 17.2 01/11/2022 1251   LDLCALC 67 01/11/2022 1251   LDLCALC 71 06/03/2021 0922   LDLCALC 78 12/26/2019 1040     Wt Readings from Last 3 Encounters:  06/03/22 88.7 kg  05/27/22 88.5 kg  05/25/22 86.2 kg    Assessment and Plan:  1. CAD without angina: Cardiac cath March 2018 with mild to moderate non-obstructive CAD. No chest pain. Continue Plavix, beta blocker and statin. He is on Plavix because of his stroke in 2021.   2. HYPERTENSION:  BP is controlled at home. No changes  3. HYPERLIPIDEMIA: LDL 67 in September 2023. Continue statin and Zetia.   4. Chronic diastolic CHF: No volume overload on exam. Weight is stable. Lasix as needed.    5. Stroke: Followed in neurology. Cardiac monitor with sinus and PVCs. Loop recorder not recommended by Neuro team.   Labs/ tests ordered today include:   Orders Placed This Encounter  Procedures   EKG 12-Lead   Disposition:   F/U with me 12 months  Signed, Lauree Chandler, MD 06/03/2022 11:53 AM    [facility] [address], [place], [place]  [address] Phone: [phone]; Fax: [phone]

## 2022-06-03 NOTE — Progress Notes (Signed)
Kindly inform the patient that carotid ultrasound study shows no significant blockages of either carotid artery in the neck.  No significant change from previous ultrasound in August 2021

## 2022-06-08 ENCOUNTER — Other Ambulatory Visit: Payer: Self-pay | Admitting: Cardiovascular Disease

## 2022-06-08 ENCOUNTER — Other Ambulatory Visit: Payer: Self-pay | Admitting: Internal Medicine

## 2022-06-25 ENCOUNTER — Other Ambulatory Visit: Payer: Self-pay | Admitting: Cardiovascular Disease

## 2022-07-06 LAB — LAB REPORT - SCANNED
Albumin, Urine POC: 7.5
Albumin/Creatinine Ratio, Urine, POC: 5
Creatinine, POC: 143.3 mg/dL

## 2022-07-08 ENCOUNTER — Ambulatory Visit: Payer: Medicare Other | Admitting: Internal Medicine

## 2022-07-22 ENCOUNTER — Telehealth: Payer: Self-pay | Admitting: Internal Medicine

## 2022-07-22 NOTE — Telephone Encounter (Signed)
Patient called and states that a nurse in the facility where his daughter works removed three deer ticks from him this morning and suggested he call his PCP office to request RX for antibiotics.  If appropriate, please send RX to [id number] - [place], [place] - [address]   Please call patient back to advise at [phone]

## 2022-07-23 NOTE — Telephone Encounter (Signed)
Patient called and asked for the status of filling an antibiotic. He was recommended to get Doxycycline and was told he would need to take it within 72 hours. Best callback is [phone].

## 2022-07-23 NOTE — Telephone Encounter (Signed)
Pt hasnot seen Plotnikov since 05/2022. Office POLICY NO ANTIBIOTICS  w/o an OV. MD is out of the office today. Schedule w/ another provider or Dr. Macario Golds first available.Marland KitchenSantiago Bumpers

## 2022-08-05 ENCOUNTER — Encounter: Payer: Self-pay | Admitting: Internal Medicine

## 2022-08-05 ENCOUNTER — Ambulatory Visit: Payer: Medicare Other | Admitting: Internal Medicine

## 2022-08-05 VITALS — BP 128/80 | HR 54 | Temp 98.0°F | Ht 69.0 in | Wt 192.0 lb

## 2022-08-05 DIAGNOSIS — N401 Enlarged prostate with lower urinary tract symptoms: Secondary | ICD-10-CM | POA: Diagnosis not present

## 2022-08-05 DIAGNOSIS — F09 Unspecified mental disorder due to known physiological condition: Secondary | ICD-10-CM | POA: Diagnosis not present

## 2022-08-05 DIAGNOSIS — R413 Other amnesia: Secondary | ICD-10-CM

## 2022-08-05 DIAGNOSIS — Z8673 Personal history of transient ischemic attack (TIA), and cerebral infarction without residual deficits: Secondary | ICD-10-CM

## 2022-08-05 DIAGNOSIS — F419 Anxiety disorder, unspecified: Secondary | ICD-10-CM | POA: Diagnosis not present

## 2022-08-05 DIAGNOSIS — R739 Hyperglycemia, unspecified: Secondary | ICD-10-CM

## 2022-08-05 DIAGNOSIS — N400 Enlarged prostate without lower urinary tract symptoms: Secondary | ICD-10-CM | POA: Insufficient documentation

## 2022-08-05 DIAGNOSIS — I10 Essential (primary) hypertension: Secondary | ICD-10-CM

## 2022-08-05 DIAGNOSIS — R350 Frequency of micturition: Secondary | ICD-10-CM

## 2022-08-05 DIAGNOSIS — E785 Hyperlipidemia, unspecified: Secondary | ICD-10-CM

## 2022-08-05 MED ORDER — ARIPIPRAZOLE 2 MG PO TABS: 2.0000 mg | ORAL_TABLET | Freq: Every day | ORAL | 5 refills | Status: DC

## 2022-08-05 NOTE — Assessment & Plan Note (Signed)
Better on Tamsulosin

## 2022-08-05 NOTE — Assessment & Plan Note (Signed)
Monitor CBG 

## 2022-08-05 NOTE — Assessment & Plan Note (Signed)
Fluoxetine to 20 mg/d - d/c Added Abilify low dose w/dinner

## 2022-08-05 NOTE — Assessment & Plan Note (Signed)
Tramadol - low dose if tolerated

## 2022-08-05 NOTE — Progress Notes (Signed)
Subjective:  Patient ID: Larry Stone, male    DOB: Feb 18, 1951  Age: 72 y.o. MRN: [id number]  CC: Medical Management of Chronic Issues (No concerns or questions)   HPI TOLUWANI YADAV presents for being moody - pt stopped Fluoxetine Sleeping 4 h per night C/o anxiety Memory seems to be getting  better   Outpatient Medications Prior to Visit  Medication Sig Dispense Refill   ALPRAZolam (XANAX) 0.5 MG tablet TAKE 1 TABLET BY MOUTH THREE TIMES A DAY AS NEEDED 270 tablet 1   buPROPion (WELLBUTRIN SR) 150 MG 12 hr tablet TAKE 1 TABLET BY MOUTH TWICE A DAY 180 tablet 3   carvedilol (COREG) 25 MG tablet TAKE 1 TABLET BY MOUTH 2 TIMES DAILY WITH A MEAL. 180 tablet 3   clopidogrel (PLAVIX) 75 MG tablet Take 1 tablet (75 mg total) by mouth daily. 90 tablet 2   clotrimazole-betamethasone (LOTRISONE) cream APPLY AS DIRECTED 45 g 11   donepezil (ARICEPT) 5 MG tablet Take 1 tablet (5 mg total) by mouth at bedtime. 30 tablet 5   ezetimibe (ZETIA) 10 MG tablet TAKE 1 TABLET BY MOUTH EVERY DAY 90 tablet 3   Fluticasone-Umeclidin-Vilant (TRELEGY ELLIPTA) 100-62.5-25 MCG/ACT AEPB Inhale 1 puff into the lungs daily. (Patient taking differently: Inhale 1 puff into the lungs as needed.) 1 each 11   hydrALAZINE (APRESOLINE) 50 MG tablet TAKE 1 TABLET BY MOUTH FOUR TIMES A DAY 360 tablet 3   ipratropium-albuterol (DUONEB) 0.5-2.5 (3) MG/3ML SOLN Take 3 mLs by nebulization every 4 (four) hours as needed (every 4-6 hours prn). Take by nebulization every 4-6 hours as needed 360 mL 2   meclizine (ANTIVERT) 25 MG tablet Take 0.5 tablets (12.5 mg total) by mouth 3 (three) times daily as needed for dizziness. 30 tablet 0   metFORMIN (GLUCOPHAGE) 500 MG tablet TAKE 1 TABLET BY MOUTH DAILY WITH BREAKFAST. 90 tablet 1   montelukast (SINGULAIR) 10 MG tablet TAKE 1 TABLET BY MOUTH EVERYDAY AT BEDTIME 90 tablet 3   nitroGLYCERIN (NITROSTAT) 0.4 MG SL tablet Place 1 tablet (0.4 mg total) under the tongue every 5 (five)  minutes as needed for chest pain. Up to 3 doses. 75 tablet 2   pravastatin (PRAVACHOL) 40 MG tablet TAKE 1 TABLET BY MOUTH EVERY DAY 90 tablet 3   tamsulosin (FLOMAX) 0.4 MG CAPS capsule Take 1 capsule (0.4 mg total) by mouth daily. 30 capsule 11   traMADol (ULTRAM) 50 MG tablet TAKE 1 TABLET BY MOUTH EVERY 6 HOURS AS NEEDED 120 tablet 1   FLUoxetine (PROZAC) 20 MG capsule Take 1 capsule (20 mg total) by mouth daily. (Patient not taking: Reported on 08/05/2022) 30 capsule 5   No facility-administered medications prior to visit.    ROS: Review of Systems  Objective:  BP 128/80   Pulse (!) 54   Temp 98 F (36.7 C) (Oral)   Ht  (1.753 m)   Wt 192 lb (87.1 kg)   SpO2 95%   BMI 28.35 kg/m   BP Readings from Last 3 Encounters:  08/05/22 128/80  06/03/22 (!) 130/90  05/27/22 110/60    Wt Readings from Last 3 Encounters:  08/05/22 192 lb (87.1 kg)  06/03/22 195 lb 9.6 oz (88.7 kg)  05/27/22 195 lb (88.5 kg)    Physical Exam  Lab Results  Component Value Date   WBC 8.9 05/25/2022   HGB 14.1 05/25/2022   HCT 42.1 05/25/2022   PLT 173.0 05/25/2022   GLUCOSE 86  05/25/2022   CHOL 132 01/11/2022   TRIG 86.0 01/11/2022   HDL 47.90 01/11/2022   LDLCALC 67 01/11/2022   ALT 21 05/25/2022   AST 19 05/25/2022   NA 140 05/25/2022   K 4.6 05/25/2022   CL 103 05/25/2022   CREATININE 1.34 05/25/2022   BUN 19 05/25/2022   CO2 27 05/25/2022   TSH 2.27 01/11/2022   PSA 0.33 01/11/2022   INR 1.1 12/04/2019   HGBA1C 5.7 05/25/2022    VAS US CAROTID  Result Date: 06/02/2022 Carotid Arterial Duplex Study Patient Name:  ABDULWAHAB DEMELO  Date of Exam:   06/02/2022 Medical Rec #: [id number]        Accession #:    [id number] Date of Birth: Mar 19, 1951         Patient Gender: M Patient Age:   16 years Exam Location:  [facility] Procedure:      VAS US CAROTID Referring Phys: PRAMOD SETHI --------------------------------------------------------------------------------  Indications:        History of CVA, follow up. Risk Factors:      Hypertension, hyperlipidemia, past history of smoking, prior                    MI, coronary artery disease, prior CVA. Comparison Study:  12-10-2019 Carotid duplex showed 1-39% bilateral ICA Performing Technologist: Jean Rosenthal RDMS, RVT  Examination Guidelines: A complete evaluation includes B-mode imaging, spectral Doppler, color Doppler, and power Doppler as needed of all accessible portions of each vessel. Bilateral testing is considered an integral part of a complete examination. Limited examinations for reoccurring indications may be performed as noted.  Right Carotid Findings: +----------+--------+--------+--------+------------------+--------+           PSV cm/sEDV cm/sStenosisPlaque DescriptionComments +----------+--------+--------+--------+------------------+--------+ CCA Prox  85      16              calcific                   +----------+--------+--------+--------+------------------+--------+ CCA Distal63      17              calcific                   +----------+--------+--------+--------+------------------+--------+ ICA Prox  80      22      1-39%   calcific                   +----------+--------+--------+--------+------------------+--------+ ICA Mid   125     32                                         +----------+--------+--------+--------+------------------+--------+ ICA Distal70      21                                         +----------+--------+--------+--------+------------------+--------+ ECA       125     17              calcific                   +----------+--------+--------+--------+------------------+--------+ +----------+--------+-------+----------------+-------------------+           PSV cm/sEDV cmsDescribe        Arm Pressure (mmHG) +----------+--------+-------+----------------+-------------------+ [id number]  Multiphasic, WNL                     +----------+--------+-------+----------------+-------------------+ +---------+--------+--+--------+--+---------+ VertebralPSV cm/s44EDV cm/s13Antegrade +---------+--------+--+--------+--+---------+  Left Carotid Findings: +----------+--------+--------+--------+------------------+------------------+           PSV cm/sEDV cm/sStenosisPlaque DescriptionComments           +----------+--------+--------+--------+------------------+------------------+ CCA Prox  103     22                                                   +----------+--------+--------+--------+------------------+------------------+ CCA Distal74      16                                intimal thickening +----------+--------+--------+--------+------------------+------------------+ ICA Prox  85      27      1-39%   calcific                             +----------+--------+--------+--------+------------------+------------------+ ICA Mid   93      29                                                   +----------+--------+--------+--------+------------------+------------------+ ICA Distal88      29                                                   +----------+--------+--------+--------+------------------+------------------+ ECA       119     14                                                   +----------+--------+--------+--------+------------------+------------------+ +----------+--------+--------+----------------+-------------------+           PSV cm/sEDV cm/sDescribe        Arm Pressure (mmHG) +----------+--------+--------+----------------+-------------------+ [id number]             Multiphasic, WNL                    +----------+--------+--------+----------------+-------------------+ +---------+--------+--+--------+--+---------+ VertebralPSV cm/s47EDV cm/s14Antegrade +---------+--------+--+--------+--+---------+   Summary: Right Carotid: Velocities in the right ICA are consistent  with a 1-39% stenosis. Left Carotid: Velocities in the left ICA are consistent with a 1-39% stenosis. Vertebrals:  Bilateral vertebral arteries demonstrate antegrade flow. Subclavians: Normal flow hemodynamics were seen in bilateral subclavian              arteries. *See table(s) above for measurements and observations.  Electronically signed by Delia Heady MD on 06/02/2022 at 4:01:09 PM.    Final     Assessment & Plan:   Problem List Items Addressed This Visit       Cardiovascular and Mediastinum   Essential hypertension    Cont with Coreg, Hydralazine Hydrate well        Nervous and Auditory   Mild cognitive disorder    Better on  Aricept 5 mg/d. May increase to 10 mg/d        Genitourinary   BPH (benign prostatic hyperplasia)    Better on Tamsulosin         Other   Anxiety disorder - Primary    Fluoxetine to 20 mg/d - d/c Added Abilify low dose w/dinner      Dyslipidemia    Better on Tamsulosin       History of CVA (cerebrovascular accident)     Tramadol - low dose if tolerated      Hyperglycemia    Monitor CBG      Memory disturbance    Better on  Aricept 5 mg/d. May increase to 10 mg/d         Meds ordered this encounter  Medications   ARIPiprazole (ABILIFY) 2 MG tablet    Sig: Take 1 tablet (2 mg total) by mouth daily.    Dispense:  30 tablet    Refill:  5    Take with dinner      Follow-up: Return in about 3 months (around 11/04/2022) for a follow-up visit.  Sonda Primes, MD

## 2022-08-05 NOTE — Assessment & Plan Note (Signed)
Cont with Coreg, Hydralazine ?Hydrate well ?

## 2022-08-05 NOTE — Assessment & Plan Note (Signed)
Better on  Aricept 5 mg/d. May increase to 10 mg/d

## 2022-08-05 NOTE — Assessment & Plan Note (Signed)
Better on Tamsulosin  

## 2022-08-05 NOTE — Assessment & Plan Note (Signed)
Better on  Aricept 5 mg/d. May increase to 10 mg/d 

## 2022-08-10 ENCOUNTER — Other Ambulatory Visit: Payer: Self-pay | Admitting: Cardiovascular Disease

## 2022-08-10 ENCOUNTER — Other Ambulatory Visit: Payer: Self-pay | Admitting: Internal Medicine

## 2022-08-10 DIAGNOSIS — E785 Hyperlipidemia, unspecified: Secondary | ICD-10-CM

## 2022-09-27 ENCOUNTER — Other Ambulatory Visit: Payer: Self-pay | Admitting: Internal Medicine

## 2022-10-03 ENCOUNTER — Other Ambulatory Visit: Payer: Self-pay | Admitting: Internal Medicine

## 2022-10-19 ENCOUNTER — Other Ambulatory Visit: Payer: Self-pay | Admitting: Internal Medicine

## 2022-11-03 ENCOUNTER — Ambulatory Visit: Payer: Medicare Other | Admitting: Internal Medicine

## 2022-11-17 ENCOUNTER — Encounter: Payer: Self-pay | Admitting: Internal Medicine

## 2022-11-17 ENCOUNTER — Ambulatory Visit: Payer: Medicare Other | Admitting: Internal Medicine

## 2022-11-17 ENCOUNTER — Ambulatory Visit (INDEPENDENT_AMBULATORY_CARE_PROVIDER_SITE_OTHER): Payer: Medicare Other

## 2022-11-17 VITALS — BP 110/78 | HR 49 | Temp 97.9°F | Ht 69.0 in | Wt 192.0 lb

## 2022-11-17 DIAGNOSIS — G8929 Other chronic pain: Secondary | ICD-10-CM

## 2022-11-17 DIAGNOSIS — R052 Subacute cough: Secondary | ICD-10-CM | POA: Diagnosis not present

## 2022-11-17 DIAGNOSIS — G8191 Hemiplegia, unspecified affecting right dominant side: Secondary | ICD-10-CM | POA: Diagnosis not present

## 2022-11-17 DIAGNOSIS — F419 Anxiety disorder, unspecified: Secondary | ICD-10-CM

## 2022-11-17 DIAGNOSIS — J4489 Other specified chronic obstructive pulmonary disease: Secondary | ICD-10-CM | POA: Diagnosis not present

## 2022-11-17 DIAGNOSIS — M5440 Lumbago with sciatica, unspecified side: Secondary | ICD-10-CM

## 2022-11-17 DIAGNOSIS — R739 Hyperglycemia, unspecified: Secondary | ICD-10-CM

## 2022-11-17 MED ORDER — CLOTRIMAZOLE-BETAMETHASONE 1-0.05 % EX CREA: TOPICAL_CREAM | CUTANEOUS | 11 refills | Status: AC

## 2022-11-17 MED ORDER — ARIPIPRAZOLE 5 MG PO TABS
5.0000 mg | ORAL_TABLET | Freq: Every day | ORAL | 5 refills | Status: DC
Start: 2022-11-17 — End: 2022-12-13

## 2022-11-17 MED ORDER — AMMONIUM LACTATE 12 % EX LOTN: TOPICAL_LOTION | CUTANEOUS | 3 refills | Status: AC

## 2022-11-17 NOTE — Assessment & Plan Note (Signed)
Use MDI

## 2022-11-17 NOTE — Assessment & Plan Note (Addendum)
CXR Possible aspiration - more saliva pooling on Abilify Elevate head end of bed 30% Offered PPI Use the inhaler

## 2022-11-17 NOTE — Progress Notes (Signed)
Subjective:  Patient ID: Larry Stone, male    DOB: Jul 04, 1950  Age: 72 y.o. MRN: [id number]  CC: Follow-up   HPI [name] presents for BPH, h/o CVA, anxiety/mood swings C/o hard cough spells at 3 am x 6 weeks C/o poor balance   Outpatient Medications Prior to Visit  Medication Sig Dispense Refill   ALPRAZolam (XANAX) 0.5 MG tablet TAKE 1 TABLET BY MOUTH THREE TIMES A DAY AS NEEDED 270 tablet 1   buPROPion (WELLBUTRIN SR) 150 MG 12 hr tablet TAKE 1 TABLET BY MOUTH TWICE A DAY 180 tablet 3   carvedilol (COREG) 25 MG tablet TAKE 1 TABLET BY MOUTH 2 TIMES DAILY WITH A MEAL. 180 tablet 3   clopidogrel (PLAVIX) 75 MG tablet TAKE 1 TABLET BY MOUTH EVERY DAY 90 tablet 3   donepezil (ARICEPT) 5 MG tablet TAKE 1 TABLET BY MOUTH EVERYDAY AT BEDTIME 90 tablet 1   ezetimibe (ZETIA) 10 MG tablet TAKE 1 TABLET BY MOUTH EVERY DAY 90 tablet 3   Fluticasone-Umeclidin-Vilant (TRELEGY ELLIPTA) 100-62.5-25 MCG/ACT AEPB Inhale 1 puff into the lungs daily. (Patient taking differently: Inhale 1 puff into the lungs as needed.) 1 each 11   hydrALAZINE (APRESOLINE) 50 MG tablet TAKE 1 TABLET BY MOUTH FOUR TIMES A DAY 360 tablet 3   ipratropium-albuterol (DUONEB) 0.5-2.5 (3) MG/3ML SOLN Take 3 mLs by nebulization every 4 (four) hours as needed (every 4-6 hours prn). Take by nebulization every 4-6 hours as needed 360 mL 2   meclizine (ANTIVERT) 25 MG tablet Take 0.5 tablets (12.5 mg total) by mouth 3 (three) times daily as needed for dizziness. 30 tablet 0   metFORMIN (GLUCOPHAGE) 500 MG tablet TAKE 1 TABLET BY MOUTH EVERY DAY WITH BREAKFAST 90 tablet 1   montelukast (SINGULAIR) 10 MG tablet TAKE 1 TABLET BY MOUTH EVERYDAY AT BEDTIME 90 tablet 3   nitroGLYCERIN (NITROSTAT) 0.4 MG SL tablet Place 1 tablet (0.4 mg total) under the tongue every 5 (five) minutes as needed for chest pain. Up to 3 doses. 75 tablet 2   pravastatin (PRAVACHOL) 40 MG tablet TAKE 1 TABLET BY MOUTH EVERY DAY 90 tablet 3   tamsulosin  (FLOMAX) 0.4 MG CAPS capsule Take 1 capsule (0.4 mg total) by mouth daily. 30 capsule 11   traMADol (ULTRAM) 50 MG tablet TAKE 1 TABLET BY MOUTH EVERY 6 HOURS AS NEEDED 120 tablet 1   ARIPiprazole (ABILIFY) 2 MG tablet TAKE 1 TABLET BY MOUTH EVERY DAY 90 tablet 1   clotrimazole-betamethasone (LOTRISONE) cream APPLY AS DIRECTED 45 g 11   No facility-administered medications prior to visit.    ROS: Review of Systems  Constitutional:  Positive for fatigue. Negative for appetite change and unexpected weight change.  HENT:  Negative for congestion, nosebleeds, sneezing, sore throat and trouble swallowing.   Eyes:  Negative for itching and visual disturbance.  Respiratory:  Positive for cough.   Cardiovascular:  Negative for chest pain, palpitations and leg swelling.  Gastrointestinal:  Negative for abdominal distention, blood in stool, diarrhea and nausea.  Genitourinary:  Negative for frequency and hematuria.  Musculoskeletal:  Positive for arthralgias, back pain and gait problem. Negative for joint swelling and neck pain.  Skin:  Negative for rash.  Neurological:  Negative for dizziness, tremors, speech difficulty and weakness.  Psychiatric/Behavioral:  Positive for dysphoric mood. Negative for agitation and sleep disturbance. The patient is nervous/anxious.     Objective:  BP 110/78 (BP Location: Left Arm, Patient Position: Sitting, Cuff Size:  Large)   Pulse (!) 49   Temp 97.9 F (36.6 C) (Oral)   Ht 5\' 9"  (1.753 m)   Wt 192 lb (87.1 kg)   SpO2 97%   BMI 28.35 kg/m   BP Readings from Last 3 Encounters:  11/17/22 110/78  08/05/22 128/80  06/03/22 (!) 130/90    Wt Readings from Last 3 Encounters:  11/17/22 192 lb (87.1 kg)  08/05/22 192 lb (87.1 kg)  06/03/22 195 lb 9.6 oz (88.7 kg)    Physical Exam Constitutional:      General: He is not in acute distress.    Appearance: He is well-developed. He is obese.     Comments: NAD  Eyes:     Conjunctiva/sclera: Conjunctivae  normal.     Pupils: Pupils are equal, round, and reactive to light.  Neck:     Thyroid: No thyromegaly.     Vascular: No JVD.  Cardiovascular:     Rate and Rhythm: Normal rate and regular rhythm.     Heart sounds: Normal heart sounds. No murmur heard.    No friction rub. No gallop.  Pulmonary:     Effort: Pulmonary effort is normal. No respiratory distress.     Breath sounds: Normal breath sounds. No wheezing or rales.  Chest:     Chest wall: No tenderness.  Abdominal:     General: Bowel sounds are normal. There is no distension.     Palpations: Abdomen is soft. There is no mass.     Tenderness: There is no abdominal tenderness. There is no guarding or rebound.  Musculoskeletal:        General: Tenderness present. Normal range of motion.     Cervical back: Normal range of motion.     Right lower leg: Edema present.     Left lower leg: Edema present.  Lymphadenopathy:     Cervical: No cervical adenopathy.  Skin:    General: Skin is warm and dry.     Findings: No rash.  Neurological:     Mental Status: He is alert and oriented to person, place, and time.     Cranial Nerves: No cranial nerve deficit.     Motor: No abnormal muscle tone.     Coordination: Coordination normal.     Gait: Gait normal.     Deep Tendon Reflexes: Reflexes are normal and symmetric.  Psychiatric:        Behavior: Behavior normal.        Thought Content: Thought content normal.        Judgment: Judgment normal.   B trace edema at ankles LS w/pain Ataxic   Lab Results  Component Value Date   WBC 8.9 05/25/2022   HGB 14.1 05/25/2022   HCT 42.1 05/25/2022   PLT 173.0 05/25/2022   GLUCOSE 86 05/25/2022   CHOL 132 01/11/2022   TRIG 86.0 01/11/2022   HDL 47.90 01/11/2022   LDLCALC 67 01/11/2022   ALT 21 05/25/2022   AST 19 05/25/2022   NA 140 05/25/2022   K 4.6 05/25/2022   CL 103 05/25/2022   CREATININE 1.34 05/25/2022   BUN 19 05/25/2022   CO2 27 05/25/2022   TSH 2.27 01/11/2022   PSA  0.33 01/11/2022   INR 1.1 12/04/2019   HGBA1C 5.7 05/25/2022    VAS US CAROTID  Result Date: 06/02/2022 Carotid Arterial Duplex Study Patient Name:  Larry Stone  Date of Exam:   06/02/2022 Medical Rec #: [id number]  Accession #:    [id number] Date of Birth: November 06, 1950         Patient Gender: M Patient Age:   37 years Exam Location:  [facility] Procedure:      VAS US CAROTID Referring Phys: PRAMOD SETHI --------------------------------------------------------------------------------  Indications:       History of CVA, follow up. Risk Factors:      Hypertension, hyperlipidemia, past history of smoking, prior                    MI, coronary artery disease, prior CVA. Comparison Study:  12-10-2019 Carotid duplex showed 1-39% bilateral ICA Performing Technologist: Jean Rosenthal RDMS, RVT  Examination Guidelines: A complete evaluation includes B-mode imaging, spectral Doppler, color Doppler, and power Doppler as needed of all accessible portions of each vessel. Bilateral testing is considered an integral part of a complete examination. Limited examinations for reoccurring indications may be performed as noted.  Right Carotid Findings: +----------+--------+--------+--------+------------------+--------+           PSV cm/sEDV cm/sStenosisPlaque DescriptionComments +----------+--------+--------+--------+------------------+--------+ CCA Prox  85      16              calcific                   +----------+--------+--------+--------+------------------+--------+ CCA Distal63      17              calcific                   +----------+--------+--------+--------+------------------+--------+ ICA Prox  80      22      1-39%   calcific                   +----------+--------+--------+--------+------------------+--------+ ICA Mid   125     32                                         +----------+--------+--------+--------+------------------+--------+ ICA Distal70      21                                          +----------+--------+--------+--------+------------------+--------+ ECA       125     17              calcific                   +----------+--------+--------+--------+------------------+--------+ +----------+--------+-------+----------------+-------------------+           PSV cm/sEDV cmsDescribe        Arm Pressure (mmHG) +----------+--------+-------+----------------+-------------------+ [id number]            Multiphasic, WNL                    +----------+--------+-------+----------------+-------------------+ +---------+--------+--+--------+--+---------+ VertebralPSV cm/s44EDV cm/s13Antegrade +---------+--------+--+--------+--+---------+  Left Carotid Findings: +----------+--------+--------+--------+------------------+------------------+           PSV cm/sEDV cm/sStenosisPlaque DescriptionComments           +----------+--------+--------+--------+------------------+------------------+ CCA Prox  103     22                                                   +----------+--------+--------+--------+------------------+------------------+  CCA Distal74      16                                intimal thickening +----------+--------+--------+--------+------------------+------------------+ ICA Prox  85      27      1-39%   calcific                             +----------+--------+--------+--------+------------------+------------------+ ICA Mid   93      29                                                   +----------+--------+--------+--------+------------------+------------------+ ICA Distal88      29                                                   +----------+--------+--------+--------+------------------+------------------+ ECA       119     14                                                   +----------+--------+--------+--------+------------------+------------------+  +----------+--------+--------+----------------+-------------------+           PSV cm/sEDV cm/sDescribe        Arm Pressure (mmHG) +----------+--------+--------+----------------+-------------------+ [id number]             Multiphasic, WNL                    +----------+--------+--------+----------------+-------------------+ +---------+--------+--+--------+--+---------+ VertebralPSV cm/s47EDV cm/s14Antegrade +---------+--------+--+--------+--+---------+   Summary: Right Carotid: Velocities in the right ICA are consistent with a 1-39% stenosis. Left Carotid: Velocities in the left ICA are consistent with a 1-39% stenosis. Vertebrals:  Bilateral vertebral arteries demonstrate antegrade flow. Subclavians: Normal flow hemodynamics were seen in bilateral subclavian              arteries. *See table(s) above for measurements and observations.  Electronically signed by Delia Heady MD on 06/02/2022 at 4:01:09 PM.    Final     Assessment & Plan:   Problem List Items Addressed This Visit     Anxiety disorder    Will increase Abilify to 5 mg/d. It is helping      Relevant Orders   CBC with Differential/Platelet   TSH   LOW BACK PAIN SYNDROME    R LBP Check UA Tramadol prn  Potential benefits of a long term opioids use as well as potential risks (i.e. addiction risk, apnea etc) and complications (i.e. Somnolence, constipation and others) were explained to the patient and were aknowledged. Start PT      Relevant Orders   Ambulatory referral to Physical Therapy   Hyperglycemia   Relevant Orders   Hemoglobin A1c   COPD with asthma    Use MDI      Right hemiparesis (HCC)    Start PT for balance      Relevant Orders   Ambulatory referral to Physical Therapy   CBC with Differential/Platelet   Comprehensive metabolic panel   TSH  Cough - Primary    CXR Possible aspiration - more saliva pooling on Abilify Elevate head end of bed 30% Offered PPI Use the inhaler       Relevant Orders   DG Chest 2 View      Meds ordered this encounter  Medications   clotrimazole-betamethasone (LOTRISONE) cream    Sig: APPLY AS DIRECTED    Dispense:  45 g    Refill:  11   ammonium lactate (LAC-HYDRIN) 12 % lotion    Sig: Apply topically as directed.    Dispense:  400 mL    Refill:  3   ARIPiprazole (ABILIFY) 5 MG tablet    Sig: Take 1 tablet (5 mg total) by mouth daily.    Dispense:  30 tablet    Refill:  5      Follow-up: Return in about 3 months (around 02/17/2023) for a follow-up visit.  Sonda Primes, MD

## 2022-11-17 NOTE — Assessment & Plan Note (Signed)
R LBP Check UA Tramadol prn  Potential benefits of a long term opioids use as well as potential risks (i.e. addiction risk, apnea etc) and complications (i.e. Somnolence, constipation and others) were explained to the patient and were aknowledged. Start PT

## 2022-11-17 NOTE — Assessment & Plan Note (Signed)
Will increase Abilify to 5 mg/d. It is helping

## 2022-11-17 NOTE — Assessment & Plan Note (Signed)
Start PT for balance

## 2022-12-07 ENCOUNTER — Ambulatory Visit: Payer: Medicare Other | Attending: Internal Medicine

## 2022-12-07 VITALS — BP 157/72

## 2022-12-07 DIAGNOSIS — M5440 Lumbago with sciatica, unspecified side: Secondary | ICD-10-CM | POA: Diagnosis not present

## 2022-12-07 DIAGNOSIS — G8191 Hemiplegia, unspecified affecting right dominant side: Secondary | ICD-10-CM | POA: Diagnosis not present

## 2022-12-07 DIAGNOSIS — R293 Abnormal posture: Secondary | ICD-10-CM | POA: Diagnosis present

## 2022-12-07 DIAGNOSIS — M6281 Muscle weakness (generalized): Secondary | ICD-10-CM | POA: Insufficient documentation

## 2022-12-07 DIAGNOSIS — R2681 Unsteadiness on feet: Secondary | ICD-10-CM | POA: Insufficient documentation

## 2022-12-07 DIAGNOSIS — G8929 Other chronic pain: Secondary | ICD-10-CM | POA: Insufficient documentation

## 2022-12-07 DIAGNOSIS — R262 Difficulty in walking, not elsewhere classified: Secondary | ICD-10-CM | POA: Diagnosis present

## 2022-12-07 DIAGNOSIS — R2689 Other abnormalities of gait and mobility: Secondary | ICD-10-CM | POA: Insufficient documentation

## 2022-12-07 NOTE — Therapy (Signed)
OUTPATIENT PHYSICAL THERAPY NEURO EVALUATION   Patient Name: Larry Stone MRN: [id number] DOB:10-12-1950, 72 y.o., male Today's Date: 12/07/2022   PCP: Tresa Garter, MD REFERRING PROVIDER: Tresa Garter, MD  END OF SESSION:  PT End of Session - 12/07/22 1307     Visit Number 1    Number of Visits 17    Date for PT Re-Evaluation 02/18/23   to allow for scheduling conflicts   Authorization Type [employer] medicare    PT Start Time 1315    PT Stop Time 1402    PT Time Calculation (min) 47 min    Equipment Utilized During Treatment Gait belt    Activity Tolerance Patient tolerated treatment well    Behavior During Therapy [facility] for tasks assessed/performed;Flat affect             Past Medical History:  Diagnosis Date   Allergy    Anxiety    CAD    a. acute inferior lateral wall infarction in September 2004 treated medically. b.  cath 06/17/16 showing mild nonobstructive CAD with 30-40% ostial LM (eccentric), elevated LV filling pressures and normal LV function.   Chronic diastolic CHF (congestive heart failure) (HCC)    CKD (chronic kidney disease), stage II    COPD (chronic obstructive pulmonary disease) (HCC)    Diverticulosis    DJD (degenerative joint disease)    GERD (gastroesophageal reflux disease)    HTN (hypertension)    Hyperlipidemia    Low back pain syndrome    Myocardial infarction (HCC) 2004   Obesity    OSA (obstructive sleep apnea)    RBBB    Recurrent aspiration bronchitis/pneumonia    Recurrent aspiration pneumonia (HCC)    Hattie Perch 07/13/2016   Stroke (HCC)    Thrombocytopenia (HCC)    Tubular adenoma of colon 2014   Past Surgical History:  Procedure Laterality Date   CARDIAC CATHETERIZATION     LEFT HEART CATH AND CORONARY ANGIOGRAPHY N/A 06/17/2016   Procedure: Left Heart Cath and Coronary Angiography;  Surgeon: Kathleene Hazel, MD;  Location: [facility] INVASIVE CV LAB;  Service: Cardiovascular;  Laterality: N/A;    UVULOPALATOPHARYNGOPLASTY     surgery for OSA   Patient Active Problem List   Diagnosis Date Noted   BPH (benign prostatic hyperplasia) 08/05/2022   Hearing problem 04/06/2022   COVID-19 12/10/2021   Cough 11/26/2021   Fever 11/26/2021   Wheezing 11/26/2021   Hamstring tendon rupture 06/24/2021   Hip pain, chronic, right 02/17/2021   Paresthesias 09/01/2020   Neck pain on left side 02/28/2020   Allergic rhinitis 12/26/2019   History of CVA (cerebrovascular accident) 12/05/2019   Right hemiparesis (HCC) 12/04/2019   Chronic renal insufficiency, stage 3 (moderate) (HCC) 07/05/2019   Mild cognitive disorder 10/12/2017   Viral pneumonitis 07/22/2016   COPD with asthma 07/22/2016   Depression with anxiety 07/13/2016   Acute respiratory failure with hypoxia (HCC) 07/13/2016   Chronic diastolic CHF (congestive heart failure) (HCC) 07/13/2016   Sepsis, unspecified organism (HCC) 07/13/2016   Acute pain of right knee 06/30/2016   Fatigue 06/30/2016   Creatinine elevation 06/30/2016   Dyspnea on exertion    Laryngopharyngeal reflux (LPR) 07/07/2015   COPD with acute exacerbation (HCC) 04/04/2015   Elevated IgE level 03/18/2014   Well adult exam 03/05/2014   COPD with chronic bronchitis 12/13/2013   Venous insufficiency 11/03/2011   Edema 11/03/2011   Memory disturbance 05/13/2011   CAD (coronary artery disease) 09/17/2010   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   Incidental pulmonary nodule 06/17/2009   COLONIC POLYPS 10/17/2007   BRONCHITIS, RECURRENT 10/17/2007   Diverticulosis of large intestine 10/17/2007   DEGENERATIVE JOINT DISEASE 10/17/2007   Hyperglycemia 10/17/2007   Dyslipidemia 10/02/2007   Anxiety disorder 10/02/2007   Obstructive sleep apnea 10/02/2007   Essential hypertension 10/02/2007   LOW BACK PAIN SYNDROME 10/02/2007    ONSET DATE: 11/17/2022  REFERRING DIAG: M54.40,G89.29 (ICD-10-CM) - Chronic midline low back pain with sciatica, sciatica laterality  unspecified G81.91 (ICD-10-CM) - Right hemiparesis (HCC)  THERAPY DIAG:  Abnormal posture - Plan: PT plan of care cert/re-cert  Difficulty in walking, not elsewhere classified - Plan: PT plan of care cert/re-cert  Muscle weakness (generalized) - Plan: PT plan of care cert/re-cert  Other abnormalities of gait and mobility - Plan: PT plan of care cert/re-cert  Unsteadiness on feet - Plan: PT plan of care cert/re-cert  Rationale for Evaluation and Treatment: Rehabilitation  SUBJECTIVE:                                                                                                                                                                                             SUBJECTIVE STATEMENT: Patient arrives to clinic alone, no AD. Patient first complaining of progressive drooling. Denies difficulty swallowing. Does see a neurologist tomorrow. Per patient, previous swallow study was "normal." Also complaining of low back pain, progressive worsening of his gait and balance. Also reports a "torn tendon"in his thigh. Denies any falls, but multiple stumbles.  Pt accompanied by: self  PERTINENT HISTORY: BPH, h/o CVA, anxiety, GERD, CHF, MI, COPD, HTN, HLD, CKD  PAIN:  Are you having pain? Yes: NPRS scale: "6, 7, or 8"/10 Pain location: low back, R hip, R thigh Pain description: stabbing Aggravating factors: sitting for too long, standing still for too long Relieving factors: walking around   Vitals:   12/07/22 1345  BP: (!) 157/72    PRECAUTIONS: Fall  RED FLAGS: None   WEIGHT BEARING RESTRICTIONS: No  FALLS: Has patient fallen in last 6 months? No, multiple stumbles  LIVING ENVIRONMENT: Lives with: lives with their spouse Lives in: House/apartment Stairs: Yes: Internal: 15, has a chair lift steps; on right going up Has following equipment at home:  walking stick  PLOF: Independent  PATIENT GOALS: "I need to get my balance and gait back"   OBJECTIVE:   DIAGNOSTIC  FINDINGS:  Lumbar mri 11/2020:  IMPRESSION: 1. Lumbar spine spondylosis as described above. 2. No acute osseous injury of the lumbar spine.  R hip MRI IMPRESSION: 1. Complete tear of the hamstring component of the adductor magnus tendon origin from  the ischial tuberosity with approximately 2.4 cm retraction with fluid-filled gap. 2. Tendinosis with low-grade partial tearing of the semimembranosus tendon as well as the conjoint tendon of the long head biceps femoris and semitendinosus without a full-thickness or retracted component.  COGNITION: Overall cognitive status: Within functional limits for tasks assessed   SENSATION: Slightly impaired sensation in R foot   COORDINATION: Very slight ataxia in R LE> LLE  EDEMA:  Fluctuating distal LE edema   POSTURE: rounded shoulders, forward head, increased thoracic kyphosis, posterior pelvic tilt, and flexed trunk   LOWER EXTREMITY MMT:    MMT Right Eval Left Eval  Hip flexion 5 5  Hip extension    Hip abduction 5 5  Hip adduction 5 5  Hip internal rotation    Hip external rotation    Knee flexion 4- 5  Knee extension 5 5  Ankle dorsiflexion 4+ 5  Ankle plantarflexion    Ankle inversion    Ankle eversion    (Blank rows = not tested)  BED MOBILITY:  Reports no difficulty   TRANSFERS: Assistive device utilized: None  Sit to stand: Modified independence Stand to sit: Modified independence Chair to chair: Modified independence   STAIRS: Level of Assistance: SBA Stair Negotiation Technique: Step to Pattern with No Rails Number of Stairs: 4  Height of Stairs: 6   GAIT: Gait pattern: step through pattern, decreased arm swing- Right, decreased arm swing- Left, circumduction- Right, ataxic, trunk flexed, wide BOS, and poor foot clearance- Right Distance walked: clinic Assistive device utilized: None Level of assistance: Modified independence and SBA Comments: intermittent R toe catching  FUNCTIONAL TESTS:   [facility]  PT Assessment - 12/07/22 0001       Standardized Balance Assessment   Standardized Balance Assessment Timed Up and Go Test    Five times sit to stand comments  13.19s    10 Meter Walk 1.4m/s      Timed Up and Go Test   Normal TUG (seconds) 10.56      Functional Gait  Assessment   Gait assessed  Yes    Gait Level Surface Walks 20 ft in less than 7 sec but greater than 5.5 sec, uses assistive device, slower speed, mild gait deviations, or deviates 6-10 in outside of the 12 in walkway width.    Change in Gait Speed Able to change speed, demonstrates mild gait deviations, deviates 6-10 in outside of the 12 in walkway width, or no gait deviations, unable to achieve a major change in velocity, or uses a change in velocity, or uses an assistive device.    Gait with Horizontal Head Turns Performs head turns smoothly with slight change in gait velocity (eg, minor disruption to smooth gait path), deviates 6-10 in outside 12 in walkway width, or uses an assistive device.    Gait with Vertical Head Turns Performs task with slight change in gait velocity (eg, minor disruption to smooth gait path), deviates 6 - 10 in outside 12 in walkway width or uses assistive device    Gait and Pivot Turn Pivot turns safely within 3 sec and stops quickly with no loss of balance.    Step Over Obstacle Is able to step over one shoe box (4.5 in total height) without changing gait speed. No evidence of imbalance.    Gait with Narrow Base of Support Ambulates 7-9 steps.    Gait with Eyes Closed Walks 20 ft, slow speed, abnormal gait pattern, evidence for imbalance, deviates 10-15 in outside 12  in walkway width. Requires more than 9 sec to ambulate 20 ft.    Ambulating Backwards Walks 20 ft, slow speed, abnormal gait pattern, evidence for imbalance, deviates 10-15 in outside 12 in walkway width.    Steps Two feet to a stair, must use rail.    Total Score 18             TODAY'S TREATMENT:                                                                                                                               N/a eval   PATIENT EDUCATION: Education details: PT POC, exam findings, f/u with neuro re: drooling and possible rec for ST Person educated: Patient Education method: Explanation Education comprehension: verbalized understanding and needs further education  HOME EXERCISE PROGRAM: To be provided  GOALS: Goals reviewed with patient? Yes  SHORT TERM GOALS: Target date: 01/07/23  Pt will be independent with initial HEP for improved balance and gait mechanics  Baseline: to be provided Goal status: INITIAL  2.  Pt will improve FGA to >/= 23/30 to demonstrate improved balance and reduced fall risk  Baseline: 18/30 Goal status: INITIAL  3.  Pt will improve gait speed to >/= 1.42m/s to demonstrate improved community ambulation  Baseline: 1.47m/s Goal status: INITIAL  4.  Pt will improve 5x STS to </= 10 sec to demo improved functional LE strength and balance   Baseline: 13.19s Goal status: INITIAL  5.  MCTSIB goal Baseline:  to be assessed Goal status: INITIAL  LONG TERM GOALS: Target date: 02/18/23  Pt will be independent with final HEP for improved balance and gait mechanics  Baseline: to be provided Goal status: INITIAL  Pt will improve FGA to >/= 23/30 to demonstrate improved balance and reduced fall risk  Baseline: 18/30 Goal status: INITIAL  Pt will improve gait speed to >/= 1.51m/s to demonstrate improved community ambulation  Baseline: 1.98m/s Goal status: INITIAL  Pt will improve 5x STS to </= 10 sec to demo improved functional LE strength and balance   Baseline: 13.19s Goal status: INITIAL  MCTSIB goal Baseline:  to be assessed Goal status: INITIAL  ASSESSMENT:  CLINICAL IMPRESSION: Patient is a 72 y.o. male who was seen today for physical therapy evaluation and treatment for gait impairment and instability related to previous h/o of CVA and current LBP/ R  hip pain. Patient demonstrates increased fall risk as noted by score of 18/30 on  Functional Gait Assessment.   <22/30 = predictive of falls, <20/30 = fall in 6 months, <18/30 = predictive of falls in PD MCID: 5 points stroke population, 4 points geriatric population (ANPTA Core Set of Outcome Measures for Adults with Neurologic Conditions, 2018). Patient completed the Timed Up and Go test (TUG) in 10.56 seconds.  Geriatrics: need for further assessment of fall risk: > 12 sec; Recurrent falls: > 15 sec; Vestibular Disorders fall risk: > 15 sec; Parkinson's Disease  fall risk: > 16 sec ([email or web address], 2023). Five times Sit to Stand Test (FTSS) Method: Use a straight back chair with a solid seat that is 17-18" high. Ask participant to sit on the chair with arms folded across their chest.   Instructions: "Stand up and sit down as quickly as possible 5 times, keeping your arms folded across your chest."   Measurement: Stop timing when the participant touches the chair in sitting the 5th time.  TIME: 13.19 sec  Cut off scores indicative of increased fall risk: >12 sec CVA, >16 sec PD, >13 sec vestibular (ANPTA Core Set of Outcome Measures for Adults with Neurologic Conditions, 2018). 10 Meter Walk Test: Patient instructed to walk 10 meters (32.8 ft) as quickly and as safely as possible at their normal speed x2 and at a fast speed x2. Time measured from 2 meter mark to 8 meter mark to accommodate ramp-up and ramp-down.  Normal speed: 1.43m/s Cut off scores: <0.4 m/s = household Ambulator, 0.4-0.8 m/s = limited community Ambulator, >0.8 m/s = community Ambulator, >1.2 m/s = crossing a street, <1.0 = increased fall risk MCID 0.05 m/s (small), 0.13 m/s (moderate), 0.06 m/s (significant)  (ANPTA Core Set of Outcome Measures for Adults with Neurologic Conditions, 2018). His gait and stability is complicated by a ruptured R hamstring and h/o of R hemiparesis. He would benefit from skilled PT services to address  the above mentioned deficits.   OBJECTIVE IMPAIRMENTS: Abnormal gait, cardiopulmonary status limiting activity, decreased activity tolerance, decreased balance, decreased endurance, decreased knowledge of condition, difficulty walking, decreased strength, impaired flexibility, impaired sensation, postural dysfunction, and pain.   ACTIVITY LIMITATIONS: carrying, lifting, sitting, standing, stairs, locomotion level, and caring for others  PARTICIPATION LIMITATIONS: interpersonal relationship, driving, shopping, community activity, and yard work  PERSONAL FACTORS: Age, Fitness, Past/current experiences, Time since onset of injury/illness/exacerbation, and 3+ comorbidities: see above  are also affecting patient's functional outcome.   REHAB POTENTIAL: Good  CLINICAL DECISION MAKING: Stable/uncomplicated  EVALUATION COMPLEXITY: Low  PLAN:  PT FREQUENCY: 2x/week  PT DURATION: 8 weeks  PLANNED INTERVENTIONS: Therapeutic exercises, Therapeutic activity, Neuromuscular re-education, Balance training, Gait training, Patient/Family education, Self Care, Joint mobilization, Stair training, Vestibular training, Visual/preceptual remediation/compensation, Orthotic/Fit training, DME instructions, Aquatic Therapy, Dry Needling, Electrical stimulation, Cryotherapy, Moist heat, Manual therapy, and Re-evaluation  PLAN FOR NEXT SESSION: HEP, MCTSIB, discuss aquatic therapy, balance tasks   Westley Foots, PT Westley Foots, PT, DPT, CBIS  12/07/2022, 2:18 PM

## 2022-12-12 ENCOUNTER — Other Ambulatory Visit: Payer: Self-pay | Admitting: Internal Medicine

## 2022-12-13 ENCOUNTER — Other Ambulatory Visit: Payer: Self-pay | Admitting: Internal Medicine

## 2022-12-15 ENCOUNTER — Ambulatory Visit: Payer: Medicare Other

## 2022-12-15 DIAGNOSIS — R262 Difficulty in walking, not elsewhere classified: Secondary | ICD-10-CM

## 2022-12-15 DIAGNOSIS — R293 Abnormal posture: Secondary | ICD-10-CM | POA: Diagnosis not present

## 2022-12-15 DIAGNOSIS — R2681 Unsteadiness on feet: Secondary | ICD-10-CM

## 2022-12-15 DIAGNOSIS — R2689 Other abnormalities of gait and mobility: Secondary | ICD-10-CM

## 2022-12-15 NOTE — Therapy (Signed)
OUTPATIENT PHYSICAL THERAPY NEURO TREATMENT   Patient Name: Larry Stone MRN: [id number] DOB:Dec 22, 1950, 72 y.o., male Today's Date: 12/15/2022   PCP: Tresa Garter, MD REFERRING PROVIDER: Tresa Garter, MD  END OF SESSION:  PT End of Session - 12/15/22 1100     Visit Number 2    Number of Visits 17    Date for PT Re-Evaluation 02/18/23    Authorization Type [employer] medicare    PT Start Time 1100    PT Stop Time 1145    PT Time Calculation (min) 45 min    Equipment Utilized During Treatment Gait belt    Activity Tolerance Patient tolerated treatment well    Behavior During Therapy WFL for tasks assessed/performed;Flat affect             Past Medical History:  Diagnosis Date   Allergy    Anxiety    CAD    a. acute inferior lateral wall infarction in September 2004 treated medically. b.  cath 06/17/16 showing mild nonobstructive CAD with 30-40% ostial LM (eccentric), elevated LV filling pressures and normal LV function.   Chronic diastolic CHF (congestive heart failure) (HCC)    CKD (chronic kidney disease), stage II    COPD (chronic obstructive pulmonary disease) (HCC)    Diverticulosis    DJD (degenerative joint disease)    GERD (gastroesophageal reflux disease)    HTN (hypertension)    Hyperlipidemia    Low back pain syndrome    Myocardial infarction (HCC) 2004   Obesity    OSA (obstructive sleep apnea)    RBBB    Recurrent aspiration bronchitis/pneumonia    Recurrent aspiration pneumonia (HCC)    Hattie Perch 07/13/2016   Stroke (HCC)    Thrombocytopenia (HCC)    Tubular adenoma of colon 2014   Past Surgical History:  Procedure Laterality Date   CARDIAC CATHETERIZATION     LEFT HEART CATH AND CORONARY ANGIOGRAPHY N/A 06/17/2016   Procedure: Left Heart Cath and Coronary Angiography;  Surgeon: Kathleene Hazel, MD;  Location: [facility] INVASIVE CV LAB;  Service: Cardiovascular;  Laterality: N/A;   UVULOPALATOPHARYNGOPLASTY     surgery for OSA   Patient  Active Problem List   Diagnosis Date Noted   BPH (benign prostatic hyperplasia) 08/05/2022   Hearing problem 04/06/2022   COVID-19 12/10/2021   Cough 11/26/2021   Fever 11/26/2021   Wheezing 11/26/2021   Hamstring tendon rupture 06/24/2021   Hip pain, chronic, right 02/17/2021   Paresthesias 09/01/2020   Neck pain on left side 02/28/2020   Allergic rhinitis 12/26/2019   History of CVA (cerebrovascular accident) 12/05/2019   Right hemiparesis (HCC) 12/04/2019   Chronic renal insufficiency, stage 3 (moderate) (HCC) 07/05/2019   Mild cognitive disorder 10/12/2017   Viral pneumonitis 07/22/2016   COPD with asthma 07/22/2016   Depression with anxiety 07/13/2016   Acute respiratory failure with hypoxia (HCC) 07/13/2016   Chronic diastolic CHF (congestive heart failure) (HCC) 07/13/2016   Sepsis, unspecified organism (HCC) 07/13/2016   Acute pain of right knee 06/30/2016   Fatigue 06/30/2016   Creatinine elevation 06/30/2016   Dyspnea on exertion    Laryngopharyngeal reflux (LPR) 07/07/2015   COPD with acute exacerbation (HCC) 04/04/2015   Elevated IgE level 03/18/2014   Well adult exam 03/05/2014   COPD with chronic bronchitis 12/13/2013   Venous insufficiency 11/03/2011   Edema 11/03/2011   Memory disturbance 05/13/2011   CAD (coronary artery disease) 09/17/2010   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   Incidental pulmonary nodule  06/17/2009   COLONIC POLYPS 10/17/2007   BRONCHITIS, RECURRENT 10/17/2007   Diverticulosis of large intestine 10/17/2007   DEGENERATIVE JOINT DISEASE 10/17/2007   Hyperglycemia 10/17/2007   Dyslipidemia 10/02/2007   Anxiety disorder 10/02/2007   Obstructive sleep apnea 10/02/2007   Essential hypertension 10/02/2007   LOW BACK PAIN SYNDROME 10/02/2007    ONSET DATE: 11/17/2022  REFERRING DIAG: M54.40,G89.29 (ICD-10-CM) - Chronic midline low back pain with sciatica, sciatica laterality unspecified G81.91 (ICD-10-CM) - Right hemiparesis  (HCC)  THERAPY DIAG:  Abnormal posture  Difficulty in walking, not elsewhere classified  Other abnormalities of gait and mobility  Unsteadiness on feet  Rationale for Evaluation and Treatment: Rehabilitation  SUBJECTIVE:                                                                                                                                                                                             SUBJECTIVE STATEMENT: Patient arrives to clinic with walking stick. Discussed aquatic therapy- agreeable. Denies falls.  Pt accompanied by: self  PERTINENT HISTORY: BPH, h/o CVA, anxiety, GERD, CHF, MI, COPD, HTN, HLD, CKD  PAIN:  Are you having pain? Yes: NPRS scale: 5-6/10 Pain location: R hip    There were no vitals filed for this visit.   PRECAUTIONS: Fall  PATIENT GOALS: "I need to get my balance and gait back"   OBJECTIVE:   DIAGNOSTIC FINDINGS:  Lumbar mri 11/2020:  IMPRESSION: 1. Lumbar spine spondylosis as described above. 2. No acute osseous injury of the lumbar spine.  R hip MRI IMPRESSION: 1. Complete tear of the hamstring component of the adductor magnus tendon origin from the ischial tuberosity with approximately 2.4 cm retraction with fluid-filled gap. 2. Tendinosis with low-grade partial tearing of the semimembranosus tendon as well as the conjoint tendon of the long head biceps femoris and semitendinosus without a full-thickness or retracted component.   TODAY'S TREATMENT:                                                                                                                              -159ft  x2 4# ankle weights CGA -11ft x2 4# ankle weights + larger step length  -174ft x2 4# ankle weights + reciprocal arm swing   NMR: -MCTSIB:  -condition 1: 30s no sway  -condition 2: 30s mild sway  -condition 3: 30s mild sway  -condition 4: 23s -SLS with U LE on medicine ball 30s -> knee flex/extend -SLS with U LE on soccer ball B arms  flex/abduct - in // bars: ant/post stpe over A/P rockerboard -scifit level 3 B UE/LE for large amplitude reciprocal coordination   PATIENT EDUCATION: Education details: initial HEP, ambulatory balance  Person educated: Patient Education method: Explanation Education comprehension: verbalized understanding and needs further education  HOME EXERCISE PROGRAM: Balance: Eyes Open - Unilateral (Varied Surfaces)    Stand on left foot, eyes open. Maintain balance __30__ seconds. Repeat ___3_ times. Do __4__ sessions per week. Repeat on compliant surface: pillow.   **must have a chair/counter nearby to assist with balance **when this is easy, do eyes closed  Single Step: Forward / Backward    Lifting foot off floor, take one step slowly forward with right leg. Return to starting position. Take one step backward and return. Repeat __10__ times per session. Do __3__ sessions per day. Repeat with other leg.  **when keeping balance doing this is easy, stand on a pillow and step forward/backward  GOALS: Goals reviewed with patient? Yes  SHORT TERM GOALS: Target date: 01/07/23  Pt will be independent with initial HEP for improved balance and gait mechanics  Baseline: to be provided Goal status: INITIAL  2.  Pt will improve FGA to >/= 23/30 to demonstrate improved balance and reduced fall risk  Baseline: 18/30 Goal status: INITIAL  3.  Pt will improve gait speed to >/= 1.84m/s to demonstrate improved community ambulation  Baseline: 1.35m/s Goal status: INITIAL  4.  Pt will improve 5x STS to </= 10 sec to demo improved functional LE strength and balance   Baseline: 13.19s Goal status: INITIAL  5.  Patient will complete >/= 30s on condition 4 of MCTSIB to demonstrate improved balance Baseline:  23s Goal status: INITIAL  LONG TERM GOALS: Target date: 02/18/23  Pt will be independent with final HEP for improved balance and gait mechanics  Baseline: to be provided Goal status:  INITIAL  Pt will improve FGA to >/= 23/30 to demonstrate improved balance and reduced fall risk  Baseline: 18/30 Goal status: INITIAL  Pt will improve gait speed to >/= 1.49m/s to demonstrate improved community ambulation  Baseline: 1.60m/s Goal status: INITIAL  Pt will improve 5x STS to </= 10 sec to demo improved functional LE strength and balance   Baseline: 13.19s Goal status: INITIAL  MCTSIB goal Baseline:  not needed Goal status: INITIAL  ASSESSMENT:  CLINICAL IMPRESSION: Patient seen for skilled PT session with emphasis on gait retraining and NMR. Weights added to B LE for improved proprioceptive input to encourage greater foot clearance. Patient typically with no reciprocal arm swing contributing to LOB, but this improved with addition of reciprocal arm swing. Decreased R hip stability noted during gait and SLS tasks- verbal cues to encourage engagement with good results. Continue POC.   OBJECTIVE IMPAIRMENTS: Abnormal gait, cardiopulmonary status limiting activity, decreased activity tolerance, decreased balance, decreased endurance, decreased knowledge of condition, difficulty walking, decreased strength, impaired flexibility, impaired sensation, postural dysfunction, and pain.   ACTIVITY LIMITATIONS: carrying, lifting, sitting, standing, stairs, locomotion level, and caring for others  PARTICIPATION LIMITATIONS: interpersonal relationship, driving, shopping, community activity, and yard  work  PERSONAL FACTORS: Age, Fitness, Past/current experiences, Time since onset of injury/illness/exacerbation, and 3+ comorbidities: see above  are also affecting patient's functional outcome.   REHAB POTENTIAL: Good  CLINICAL DECISION MAKING: Stable/uncomplicated  EVALUATION COMPLEXITY: Low  PLAN:  PT FREQUENCY: 2x/week  PT DURATION: 8 weeks  PLANNED INTERVENTIONS: Therapeutic exercises, Therapeutic activity, Neuromuscular re-education, Balance training, Gait training,  Patient/Family education, Self Care, Joint mobilization, Stair training, Vestibular training, Visual/preceptual remediation/compensation, Orthotic/Fit training, DME instructions, Aquatic Therapy, Dry Needling, Electrical stimulation, Cryotherapy, Moist heat, Manual therapy, and Re-evaluation  PLAN FOR NEXT SESSION: balance tasks, Surge, resisted gait, slam balls?   Westley Foots, PT Westley Foots, PT, DPT, CBIS  12/15/2022, 11:51 AM

## 2022-12-16 ENCOUNTER — Ambulatory Visit: Payer: Medicare Other

## 2022-12-21 ENCOUNTER — Ambulatory Visit: Payer: Medicare Other | Attending: Internal Medicine | Admitting: Physical Therapy

## 2022-12-21 DIAGNOSIS — R2689 Other abnormalities of gait and mobility: Secondary | ICD-10-CM | POA: Diagnosis present

## 2022-12-21 DIAGNOSIS — R293 Abnormal posture: Secondary | ICD-10-CM | POA: Insufficient documentation

## 2022-12-21 DIAGNOSIS — M6281 Muscle weakness (generalized): Secondary | ICD-10-CM | POA: Diagnosis present

## 2022-12-21 DIAGNOSIS — R2681 Unsteadiness on feet: Secondary | ICD-10-CM | POA: Diagnosis present

## 2022-12-21 NOTE — Therapy (Signed)
OUTPATIENT PHYSICAL THERAPY NEURO TREATMENT   Patient Name: Larry Stone MRN: [id number] DOB:[birth year], 72 y.o., male Today's Date: 12/22/2022   PCP: Tresa Garter, MD REFERRING PROVIDER: Tresa Garter, MD  END OF SESSION:  PT End of Session - 12/22/22 1717     Visit Number 3    Number of Visits 17    Date for PT Re-Evaluation 02/18/23    Authorization Type [employer] medicare    PT Start Time 1405    PT Stop Time 1450    PT Time Calculation (min) 45 min    Equipment Utilized During Treatment Gait belt    Activity Tolerance Patient tolerated treatment well    Behavior During Therapy WFL for tasks assessed/performed              Past Medical History:  Diagnosis Date   Allergy    Anxiety    CAD    a. acute inferior lateral wall infarction in September 2004 treated medically. b.  cath 06/17/16 showing mild nonobstructive CAD with 30-40% ostial LM (eccentric), elevated LV filling pressures and normal LV function.   Chronic diastolic CHF (congestive heart failure) (HCC)    CKD (chronic kidney disease), stage II    COPD (chronic obstructive pulmonary disease) (HCC)    Diverticulosis    DJD (degenerative joint disease)    GERD (gastroesophageal reflux disease)    HTN (hypertension)    Hyperlipidemia    Low back pain syndrome    Myocardial infarction (HCC) 2004   Obesity    OSA (obstructive sleep apnea)    RBBB    Recurrent aspiration bronchitis/pneumonia    Recurrent aspiration pneumonia (HCC)    Hattie Perch 07/13/2016   Stroke (HCC)    Thrombocytopenia (HCC)    Tubular adenoma of colon 2014   Past Surgical History:  Procedure Laterality Date   CARDIAC CATHETERIZATION     LEFT HEART CATH AND CORONARY ANGIOGRAPHY N/A 06/17/2016   Procedure: Left Heart Cath and Coronary Angiography;  Surgeon: Kathleene Hazel, MD;  Location: [facility]VASIVE CV LAB;  Service: Cardiovascular;  Laterality: N/A;   UVULOPALATOPHARYNGOPLASTY     surgery for OSA   Patient Active  Problem List   Diagnosis Date Noted   BPH (benign prostatic hyperplasia) 08/05/2022   Hearing problem 04/06/2022   COVID-19 12/10/2021   Cough 11/26/2021   Fever 11/26/2021   Wheezing 11/26/2021   Hamstring tendon rupture 06/24/2021   Hip pain, chronic, right 02/17/2021   Paresthesias 09/01/2020   Neck pain on left side 02/28/2020   Allergic rhinitis 12/26/2019   History of CVA (cerebrovascular accident) 12/05/2019   Right hemiparesis (HCC) 12/04/2019   Chronic renal insufficiency, stage 3 (moderate) (HCC) 07/05/2019   Mild cognitive disorder 10/12/2017   Viral pneumonitis 07/22/2016   COPD with asthma 07/22/2016   Depression with anxiety 07/13/2016   Acute respiratory failure with hypoxia (HCC) 07/13/2016   Chronic diastolic CHF (congestive heart failure) (HCC) 07/13/2016   Sepsis, unspecified organism (HCC) 07/13/2016   Acute pain of right knee 06/30/2016   Fatigue 06/30/2016   Creatinine elevation 06/30/2016   Dyspnea on exertion    Laryngopharyngeal reflux (LPR) 07/07/2015   COPD with acute exacerbation (HCC) 04/04/2015   Elevated IgE level 03/18/2014   Well adult exam 03/05/2014   COPD with chronic bronchitis 12/13/2013   Venous insufficiency 11/03/2011   Edema 11/03/2011   Memory disturbance 05/13/2011   CAD (coronary artery disease) 09/17/2010   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   Incidental pulmonary nodule  06/17/2009   COLONIC POLYPS 10/17/2007   BRONCHITIS, RECURRENT 10/17/2007   Diverticulosis of large intestine 10/17/2007   DEGENERATIVE JOINT DISEASE 10/17/2007   Hyperglycemia 10/17/2007   Dyslipidemia 10/02/2007   Anxiety disorder 10/02/2007   Obstructive sleep apnea 10/02/2007   Essential hypertension 10/02/2007   LOW BACK PAIN SYNDROME 10/02/2007    ONSET DATE: 11/17/2022  REFERRING DIAG: M54.40,G89.29 (ICD-10-CM) - Chronic midline low back pain with sciatica, sciatica laterality unspecified G81.91 (ICD-10-CM) - Right hemiparesis (HCC)  THERAPY  DIAG:  Unsteadiness on feet  Other abnormalities of gait and mobility  Rationale for Evaluation and Treatment: Rehabilitation  SUBJECTIVE:                                                                                                                                                                                             SUBJECTIVE STATEMENT: Patient ambulating to PT without use of walking stick - says he left it in car today Pt accompanied by: self  PERTINENT HISTORY: BPH, h/o CVA, anxiety, GERD, CHF, MI, COPD, HTN, HLD, CKD  PAIN:  Are you having pain? Yes: NPRS scale: 5-6/10 Pain location: R hip    There were no vitals filed for this visit.   PRECAUTIONS: Fall  PATIENT GOALS: "I need to get my balance and gait back"   OBJECTIVE:   DIAGNOSTIC FINDINGS:  Lumbar mri 11/2020:  IMPRESSION: 1. Lumbar spine spondylosis as described above. 2. No acute osseous injury of the lumbar spine.  R hip MRI IMPRESSION: 1. Complete tear of the hamstring component of the adductor magnus tendon origin from the ischial tuberosity with approximately 2.4 cm retraction with fluid-filled gap. 2. Tendinosis with low-grade partial tearing of the semimembranosus tendon as well as the conjoint tendon of the long head biceps femoris and semitendinosus without a full-thickness or retracted component.   TODAY'S TREATMENT: 12-21-22  NeuroRe-ed:    Standing Balance: Surface: Airex and Floor Position: Narrow Base of Support Feet Hip Width Apart Completed with: Eyes Open and Eyes Closed; Head Turns x 5 Reps and Head Nods x 5 Reps  Single Leg Stance:   Surface: Floor  Lower Extremity: RLE and LLE  Time: 10 sec hold attempted  Tandem Stance:  Surface: Floor Completed with: Eyes Open; with UE support prn  10' x 2 reps inside // bars  Rockerboard - inside // bars with  UE support prn; anterior/posteriorly 10 reps with EO;  bil. UE support on //bars - 10 reps with EC  Holding board steady - horizontal head turns 5 reps with EO; vertical head turns with EC 5 reps  Cone taps to 2 cones - 5 reps each LE straight ahead; then 5 reps diagonal taps with UE support prn   PATIENT EDUCATION: Education details: initial HEP, ambulatory balance  Person educated: Patient Education method: Explanation Education comprehension: verbalized understanding and needs further education  HOME EXERCISE PROGRAM: Balance: Eyes Open - Unilateral (Varied Surfaces)    Stand on left foot, eyes open. Maintain balance __30__ seconds. Repeat ___3_ times. Do __4__ sessions per week. Repeat on compliant surface: pillow.   **must have a chair/counter nearby to assist with balance **when this is easy, do eyes closed  Single Step: Forward / Backward    Lifting foot off floor, take one step slowly forward with right leg. Return to starting position. Take one step backward and return. Repeat __10__ times per session. Do __3__ sessions per day. Repeat with other leg.  **when keeping balance doing this is easy, stand on a pillow and step forward/backward  GOALS: Goals reviewed with patient? Yes  SHORT TERM GOALS: Target date: 01/07/23  Pt will be independent with initial HEP for improved balance and gait mechanics  Baseline: to be provided Goal status: INITIAL  2.  Pt will improve FGA to >/= 23/30 to demonstrate improved balance and reduced fall risk  Baseline: 18/30 Goal status: INITIAL  3.  Pt will improve gait speed to >/= 1.27m/s to demonstrate improved community ambulation  Baseline: 1.77m/s Goal status: INITIAL  4.  Pt will improve 5x STS to </= 10 sec to demo improved functional LE strength and balance   Baseline: 13.19s Goal status: INITIAL  5.  Patient will complete >/= 30s on condition 4 of MCTSIB to demonstrate improved balance Baseline:  23s Goal status:  INITIAL  LONG TERM GOALS: Target date: 02/18/23  Pt will be independent with final HEP for improved balance and gait mechanics  Baseline: to be provided Goal status: INITIAL  Pt will improve FGA to >/= 23/30 to demonstrate improved balance and reduced fall risk  Baseline: 18/30 Goal status: INITIAL  Pt will improve gait speed to >/= 1.110m/s to demonstrate improved community ambulation  Baseline: 1.61m/s Goal status: INITIAL  Pt will improve 5x STS to </= 10 sec to demo improved functional LE strength and balance   Baseline: 13.19s Goal status: INITIAL  MCTSIB goal Baseline:  not needed Goal status: INITIAL  ASSESSMENT:  CLINICAL IMPRESSION: PT session focused on balance training on compliant and noncompliant surfaces with UE support prn.  Pt has significant balance deficits with EC, indicative of vestibular hypofunction in maintaining balance.  Continue POC.   OBJECTIVE IMPAIRMENTS: Abnormal gait, cardiopulmonary status limiting activity, decreased activity tolerance, decreased balance, decreased endurance, decreased knowledge of condition, difficulty walking, decreased strength, impaired flexibility, impaired sensation, postural dysfunction, and pain.   ACTIVITY LIMITATIONS: carrying, lifting, sitting, standing, stairs, locomotion level, and caring for others  PARTICIPATION LIMITATIONS: interpersonal relationship, driving, shopping, community activity, and yard work  PERSONAL FACTORS: Age, Fitness, Past/current experiences, Time since onset of injury/illness/exacerbation, and 3+ comorbidities: see above  are also affecting patient's functional outcome.   REHAB POTENTIAL: Good  CLINICAL DECISION MAKING: Stable/uncomplicated  EVALUATION COMPLEXITY: Low  PLAN:  PT FREQUENCY: 2x/week  PT DURATION: 8 weeks  PLANNED INTERVENTIONS: Therapeutic exercises, Therapeutic activity,  Neuromuscular re-education, Balance training, Gait training, Patient/Family education, Self Care,  Joint mobilization, Stair training, Vestibular training, Visual/preceptual remediation/compensation, Orthotic/Fit training, DME instructions, Aquatic Therapy, Dry Needling, Electrical stimulation, Cryotherapy, Moist heat, Manual therapy, and Re-evaluation  PLAN FOR NEXT SESSION: cont balance and gait training   Merton Wadlow, Donavan Burnet, PT  12/22/2022, 5:52 PM

## 2022-12-22 ENCOUNTER — Ambulatory Visit: Payer: Medicare Other | Admitting: Neurology

## 2022-12-22 ENCOUNTER — Other Ambulatory Visit: Payer: Self-pay | Admitting: Neurology

## 2022-12-22 ENCOUNTER — Other Ambulatory Visit: Payer: Self-pay | Admitting: Medical Genetics

## 2022-12-22 ENCOUNTER — Encounter: Payer: Self-pay | Admitting: Physical Therapy

## 2022-12-22 ENCOUNTER — Encounter: Payer: Self-pay | Admitting: Neurology

## 2022-12-22 VITALS — BP 159/67 | HR 51 | Ht 69.0 in | Wt 193.2 lb

## 2022-12-22 DIAGNOSIS — G20C Parkinsonism, unspecified: Secondary | ICD-10-CM

## 2022-12-22 DIAGNOSIS — K117 Disturbances of salivary secretion: Secondary | ICD-10-CM

## 2022-12-22 DIAGNOSIS — Z8673 Personal history of transient ischemic attack (TIA), and cerebral infarction without residual deficits: Secondary | ICD-10-CM | POA: Diagnosis not present

## 2022-12-22 DIAGNOSIS — Z006 Encounter for examination for normal comparison and control in clinical research program: Secondary | ICD-10-CM

## 2022-12-22 DIAGNOSIS — R251 Tremor, unspecified: Secondary | ICD-10-CM

## 2022-12-22 MED ORDER — CARBIDOPA-LEVODOPA 25-100 MG PO TABS: 0.5000 | ORAL_TABLET | Freq: Three times a day (TID) | ORAL | 2 refills | Status: DC

## 2022-12-22 MED ORDER — DONEPEZIL HCL 10 MG PO TABS: 10.0000 mg | ORAL_TABLET | Freq: Every day | ORAL | 3 refills | Status: DC

## 2022-12-22 MED ORDER — DONEPEZIL HCL 5 MG PO TABS: 10.0000 mg | ORAL_TABLET | Freq: Every day | ORAL | 1 refills | Status: DC

## 2022-12-22 NOTE — Progress Notes (Signed)
[facility] [address] [place]. [place] [address] [phone]       OFFICE FOLLOW UP NOTE  [name] Date of Birth:  May 01, 1950 Medical Record Number:  [id number] Take care: Referring MD: Sonda Primes  Reason for Referral: Stroke   Chief Complaint  Patient presents with   Follow-up    Patient in room #20 and alone. Patient states he been feeling unbalance and drooling a lot lately.       HPI:  Update 12/22/2022 : Patient is seen for follow-up today upon his request after last visit 7 months ago.  He is has new complaint of increased drooling on the right side of his mouth.  This is intermittent and can occur during the day or night.  There are no obvious triggers.  He has to use a lot of napkins to wipe his face.  He denies any trouble swallowing or any pain in his teeth or swelling inside his mouth.  He does admit to his voice getting softer difficulty here.  He is also noticed increased difficulty in getting out of a chair and in wearing his clothes.  He has noticed a tremor in his hands particularly in the left 1 last several months.  He denies any stooped posture or walking with feet stuck to the ground.  He states his posture balance is poor and at times tends to fall backwards.  He has started walking with some shuffling.  He denies any recurrent stroke or TIA symptoms.  He remains on Plavix which is tolerating well without bruising or bleeding.  States his blood pressure is under good control.  His sugars have also been quite fine.  He did undergo follow-up carotid ultrasound on 06/02/2022 which showed no significant extracranial stenosis.  He continues to have short-term memory difficulties which are unchanged the long-term memory is good.  He is still on Aricept 5 mg daily which is tolerating well without side effects but he has never increase the dose to 10 mg.  He is still independent actives of daily living.  He has been driving and states he never  has gotten lost.  Continues to have mild paresthesias in the right face hands and feet which are intermittent and states she has gotten used to it and is tolerating it well. Update 05/25/2022 : He returns for follow-up after last visit in May 2023.  He states that he has noticed worsening of paresthesias on his face as well as right hand and leg.  These are intermittent and they come and go and can last variably from few minutes to up to an hour.  He has gotten used to them.  He is also noted some decrease in his short-term memory phonation is difficult for him to remember.  His long-term memory is not as bad.  He has never gotten while driving.  He is still independent in all activities of daily living.  His primary care physician gave him a prescription for Aricept 5 mg daily for a month but he has not yet started it.  He has an appointment to see his primary care physician for follow-up lab work dated done today for his lipid profile A1c.  He has been compliant with taking his Plavix which is tolerating well without bruising or bleeding.  His blood pressure is under good control and today it is 135/75.  He is tolerating Pravachol and Zetia well without side effects.  He states his sugars are doing better now.  Update 09/16/2021 JM: Patient returns for 72-month stroke follow-up.  He has been stable from stroke standpoint without new stroke/TIA symptoms.  Reports continued right hand and face dysesthesias. He has greater difficulty with his hand which he feels can fluctuate between feeling cold and hot.  Use of compression glove provides some relief. Continues to have difficulty with right hamstring tear - currently using tramadol which helps pain some but is still limited in activity.  This is managed by PCP.  Compliant on Plavix, pravastatin and Zetia, denies side effects.  Blood pressure today 147/76. Monitors at home and typically 130s/70s.  Routinely follows with PCP and cardiology.  No further concerns at  this time    History provided for reference purposes only Update 03/03/2021 JM: Returns for 72-month stroke follow-up unaccompanied  Overall stable -denies new stroke/TIA symptoms Continued right hand and face post stroke pain - previously rx'd amitriptyline (for both pain and anxiety) but did not take due to interaction with bupropion.  Prior intolerance to topiramate and pregabalin.  He does continue to experience anxiety -currently on bupropion and Xanax managed by PCP Has been dealing with right hamstring tear - has been placed on tramadol due to severe pain - questions if tramadol has been helping post stroke pain some or if he hasn't been as focused on post stroke pain due to severe pain of right thigh - being followed by ortho Dr. Roda Shutters  Compliant on Plavix, pravastatin and Zetia -denies side effects Blood pressure today 115/64 Recent A1c 5.8 on metformin 500 mg daily  No new concerns at this time  Update 10/27/2020 JM: Mr. Kopka returns for 4 month stroke follow-up unaccompanied.  Stable from stroke standpoint without new stroke/TIA symptoms.  He continues to have difficulty with right face, hand and occasional foot paresthesias describing as burning and electrical shock sensation.  He has previously trialed pregabalin and topiramate but unable to tolerate.  Previously complained worsening gait difficulty, slurred speech and cognitive impairment after initiating pregabalin which resolved shortly after discontinuing.  He is currently participating in PT for right posterior thigh pain followed by orthopedics Dr. Prince Rome.  He also reports memory changes since his stroke including increased irritability and low tolerance levels - hx of depression and anxiety currently on bupropion and Xanax as needed per PCP. Reports compliance on Plavix, pravastatin and Zetia without associated side effects.  Blood pressure today 134/72.  No further concerns at this time.   Update 06/19/2020 JM: Mr. Spirko returns  for stroke follow-up after prior visit approximately 5 months ago with Dr. Pearlean Brownie.  Topiramate initiated at prior visit for poststroke paresthesias but unable to tolerate. Placed on Lyrica 50 mg twice daily beginning of November and reports approximately 2 weeks after, he was noticing increased lethargy and fluctuation of gait impairment with imbalance (getting pulled to the right) and lightheadedness, slurred speech and cognitive difficulties such as delayed responses and short-term memory difficulties.  Gait impairment with imbalance and slurred speech all present initially with stroke.  Denies new stroke/TIA symptoms.   He is unsure if Lyrica was beneficial for paresthesias as he does report some improvement since prior visit but right hand and right facial paresthesias still present. He also admits to struggling with depression with history of depression PTA on bupropion 150 mg twice daily but noticed worsening after his stroke.  He is also on Xanax as needed per PCP He has been trying to keep active and does report doing HEP daily as advised during therapy sessions  Reports compliance on Plavix and pravastatin and Zetia-denies side effects Blood pressure today 135/66  No further concerns at this time  Initial visit 02/06/2020 Dr. Pearlean Brownie: Mr. Hardcastle is a pleasant 72 year old Caucasian male with past medical history of hypertension, hyper lipidemia, diabetes, congestive heart failure and coronary artery disease who developed sudden onset of right lower face and hand numbness as well as imbalance and difficulty walking on 12/03/2019.  He did not seek medical help the same day as if waited for it to get better.  Next day he went to the hospital but got tired waiting in the ER since he was not seen and instead went to see his primary physician Dr. Posey Rea who ordered an outpatient stroke work-up.  MRI scan of the brain done on 12/20/2019 shows a subacute left thalamic lacunar infarct.  Carotid ultrasound on  12/10/2019 showed no significant extracranial stenosis.  Echocardiogram on 12/28/2018 showed slightly diminished ejection fraction of 45 to 50% but no clot or definite cardiac source of embolism was noted.  He underwent 30 days of outpatient telemetry cardiac monitoring which was negative for atrial fibrillation or significant arrhythmias.  Carotid ultrasound on 12/10/2019 was unremarkable.  LDL cholesterol was 78 mg percent on 12/26/2019.  Patient is presently on Plavix 75 mg daily which is tolerating well without bruising or bleeding.  He states his gait and balance have improved and he has finished outpatient physical and occupational therapy.  He still stumbles occasionally but as long to walk slowly and carefully.  He has had no falls or injuries.  He however still has persistent paresthesias in his right face and right hand which are bothersome and he would like to try some medications for this.  He has had no recurrent stroke or TIA symptoms.  States his blood pressure is well controlled and today it is 113/65 in office.  States his sugars are also doing well however he cannot tell me when his last hemoglobin A1c was checked.  He has been scheduled to undergo loop recorder pending my consultation visit today.  ROS:   14 system review of systems is positive for those listed in HPI and all other systems negative  PMH:  Past Medical History:  Diagnosis Date   Allergy    Anxiety    CAD    a. acute inferior lateral wall infarction in September 2004 treated medically. b.  cath 06/17/16 showing mild nonobstructive CAD with 30-40% ostial LM (eccentric), elevated LV filling pressures and normal LV function.   Chronic diastolic CHF (congestive heart failure) (HCC)    CKD (chronic kidney disease), stage II    COPD (chronic obstructive pulmonary disease) (HCC)    Diverticulosis    DJD (degenerative joint disease)    GERD (gastroesophageal reflux disease)    HTN (hypertension)    Hyperlipidemia    Low back  pain syndrome    Myocardial infarction (HCC) 2004   Obesity    OSA (obstructive sleep apnea)    RBBB    Recurrent aspiration bronchitis/pneumonia    Recurrent aspiration pneumonia (HCC)    Hattie Perch 07/13/2016   Stroke (HCC)    Thrombocytopenia (HCC)    Tubular adenoma of colon 2014    Social History:  Social History   Socioeconomic History   Marital status: Married    Spouse name: Not on file   Number of children: 1   Years of education: Not on file   Highest education level: Not on file  Occupational History   Occupation:  Retired-Engineer [employer]  Tobacco Use   Smoking status: Former    Current packs/day: 0.00    Average packs/day: 1 pack/day for 20.0 years (20.0 ttl pk-yrs)    Types: Cigarettes, Cigars    Start date: 05/08/1978    Quit date: 05/08/1998    Years since quitting: 24.6   Smokeless tobacco: Never   Tobacco comments:    quit in 2005  Vaping Use   Vaping status: Never Used  Substance and Sexual Activity   Alcohol use: Yes    Alcohol/week: 1.0 standard drink of alcohol    Types: 1 Standard drinks or equivalent per week    Comment: rarely   Drug use: No   Sexual activity: Yes  Other Topics Concern   Not on file  Social History Narrative   Lives with spouse only   Right Handed   Drinks <1 cup caffeine daily   Social Determinants of Health   Financial Resource Strain: Patient Declined (08/05/2022)   Overall Financial Resource Strain (CARDIA)    Difficulty of Paying Living Expenses: Patient declined  Food Insecurity: Patient Declined (08/05/2022)   Hunger Vital Sign    Worried About Running Out of Food in the Last Year: Patient declined    Ran Out of Food in the Last Year: Patient declined  Transportation Needs: Patient Declined (08/05/2022)   PRAPARE - Administrator, Civil Service (Medical): Patient declined    Lack of Transportation (Non-Medical): Patient declined  Physical Activity: Unknown (08/05/2022)   Exercise Vital Sign     Days of Exercise per Week: Patient declined    Minutes of Exercise per Session: 0 min  Stress: No Stress Concern Present (05/06/2022)   [employer] of Occupational Health - Occupational Stress Questionnaire    Feeling of Stress : Not at all  Social Connections: Unknown (08/05/2022)   Social Connection and Isolation Panel [NHANES]    Frequency of Communication with Friends and Family: Patient declined    Frequency of Social Gatherings with Friends and Family: Patient declined    Attends Religious Services: Patient declined    Database administrator or Organizations: Patient declined    Attends Banker Meetings: Patient declined    Marital Status: Married  Catering manager Violence: Not At Risk (05/06/2022)   Humiliation, Afraid, Rape, and Kick questionnaire    Fear of Current or Ex-Partner: No    Emotionally Abused: No    Physically Abused: No    Sexually Abused: No    Medications:   Current Outpatient Medications on File Prior to Visit  Medication Sig Dispense Refill   ALPRAZolam (XANAX) 0.5 MG tablet TAKE 1 TABLET BY MOUTH THREE TIMES A DAY AS NEEDED 270 tablet 1   ammonium lactate (LAC-HYDRIN) 12 % lotion Apply topically as directed. 400 mL 3   ARIPiprazole (ABILIFY) 5 MG tablet TAKE 1 TABLET (5 MG TOTAL) BY MOUTH DAILY. 90 tablet 2   buPROPion (WELLBUTRIN SR) 150 MG 12 hr tablet TAKE 1 TABLET BY MOUTH TWICE A DAY 180 tablet 3   carvedilol (COREG) 25 MG tablet TAKE 1 TABLET BY MOUTH 2 TIMES DAILY WITH A MEAL. 180 tablet 3   clopidogrel (PLAVIX) 75 MG tablet TAKE 1 TABLET BY MOUTH EVERY DAY 90 tablet 3   clotrimazole-betamethasone (LOTRISONE) cream APPLY AS DIRECTED 45 g 11   donepezil (ARICEPT) 5 MG tablet TAKE 1 TABLET BY MOUTH EVERYDAY AT BEDTIME 90 tablet 1   ezetimibe (ZETIA) 10 MG tablet TAKE 1  TABLET BY MOUTH EVERY DAY 90 tablet 3   Fluticasone-Umeclidin-Vilant (TRELEGY ELLIPTA) 100-62.5-25 MCG/ACT AEPB Inhale 1 puff into the lungs daily. (Patient taking  differently: Inhale 1 puff into the lungs as needed.) 1 each 11   hydrALAZINE (APRESOLINE) 50 MG tablet TAKE 1 TABLET BY MOUTH FOUR TIMES A DAY 360 tablet 3   ipratropium-albuterol (DUONEB) 0.5-2.5 (3) MG/3ML SOLN Take 3 mLs by nebulization every 4 (four) hours as needed (every 4-6 hours prn). Take by nebulization every 4-6 hours as needed 360 mL 2   meclizine (ANTIVERT) 25 MG tablet Take 0.5 tablets (12.5 mg total) by mouth 3 (three) times daily as needed for dizziness. 30 tablet 0   metFORMIN (GLUCOPHAGE) 500 MG tablet TAKE 1 TABLET BY MOUTH EVERY DAY WITH BREAKFAST 90 tablet 1   montelukast (SINGULAIR) 10 MG tablet TAKE 1 TABLET BY MOUTH EVERYDAY AT BEDTIME 90 tablet 3   nitroGLYCERIN (NITROSTAT) 0.4 MG SL tablet Place 1 tablet (0.4 mg total) under the tongue every 5 (five) minutes as needed for chest pain. Up to 3 doses. 75 tablet 2   pravastatin (PRAVACHOL) 40 MG tablet TAKE 1 TABLET BY MOUTH EVERY DAY 90 tablet 3   tamsulosin (FLOMAX) 0.4 MG CAPS capsule Take 1 capsule (0.4 mg total) by mouth daily. 30 capsule 11   traMADol (ULTRAM) 50 MG tablet TAKE 1 TABLET BY MOUTH EVERY 6 HOURS AS NEEDED 120 tablet 1   No current facility-administered medications on file prior to visit.    Allergies:   Allergies  Allergen Reactions   Ace Inhibitors Swelling   Angiotensin Receptor Blockers Swelling   Aspirin Hives   Amlodipine     Leg swelling   Crestor [Rosuvastatin Calcium]     myalgias   Cymbalta [Duloxetine Hcl]     Dizzy    Lipitor [Atorvastatin Calcium]     arthralgias   Codeine Nausea And Vomiting   Irbesartan Other (See Comments)    REACTION: allergic to ARBs w/ angioedema   Ramipril Other (See Comments)    REACTION: Allergic to ACE's w/ angioedema...    Physical Exam Today's Vitals   12/22/22 1252  BP: (!) 159/67  Pulse: (!) 51  Weight: 193 lb 3.2 oz (87.6 kg)  Height: 5\' 9"  (1.753 m)   Body mass index is 28.53 kg/m.    General: well developed, well nourished pleasant  elderly Caucasian male, seated, in no evident distress Head: head normocephalic and atraumatic.   Neck: supple with no carotid or supraclavicular bruits Cardiovascular: regular rate and rhythm, no murmurs Musculoskeletal: no deformity Skin:  no rash/petichiae Vascular:  Normal pulses all extremities  Neurologic Exam Mental Status: Awake and fully alert.  Fluent speech and language. Oriented to place and time. Recent and remote memory intact. Attention span, concentration and fund of knowledge appropriate during visit. Mood and affect appropriate.  Mini-Mental status exam scored 28/30 with 1 deficits in recall and drawing.  Able to name 15 animals which can walk on 4 legs.  Clock drawing 4/4. Cranial Nerves: Pupils equal, briskly reactive to light. Extraocular movements full without nystagmus. Visual fields full to confrontation. Hearing diminished despite hearing aid. Facial sensation intact.  Mild right nasolabial fold flattening.  Tongue, palate moves normally and symmetrically.  Motor: Normal bulk and tone. Normal strength in all tested extremity muscles.  Mild pill-rolling resting tremor of the left hand.  Mild tremor left greater than right of outstretched hands bilaterally.  No cogwheel rigidity. Sensory.: intact to touch , pinprick ,  position and vibratory sensation -subjective paresthesias in the right face and hand. Coordination: Rapid alternating movements normal in all extremities. Finger-to-nose and heel-to-shin performed accurately bilaterally. Gait and Station: Arises from chair without difficulty. Stance is normal. Gait demonstrates normal stride length and balance without use of assistive device.  Diminished left arm swing while walking.  Mild to moderate difficulty with heel, toe and tandem walk.  Slight retropulsion but does not fall.  No festination or shuffling feet Reflexes: 1+ and symmetric. Toes downgoing.       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  Vascular risk factors of hypertension hyperlipidemia coronary artery disease.  He also has mild memory loss likely due to mild cognitive impairment.  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  I recommend a trial of Sinemet 25/one 100/2 a tablet twice daily for a week to be increased to 3 times daily as tolerated.  I have discussed possible side effects with him and advised him to call me if needed.  I also encouraged to increase the Aricept dose to 10 mg daily which is the recommended dose for his memory loss and mild cognitive impairment.  Increase participation in cognitively challenging activities like solving crossword puzzles, playing bridge and sudoku.  We also discussed memory compensation strategies.  Continue Plavix for stroke prevention with aggressive risk factor modification with strict control of hypertension with blood pressure goal below 130/90, lipids with LDL cholesterol goal below 70 mg percent and diabetes with hemoglobin A1c goal below 6.5%.  Return for follow-up in the future in 3 months with nurse practitioner or call earlier if necessary.Greater than 50% time during this 40-minute visit was spent on counseling and coordination of care about his disabling  poststroke paresthesias and new complaint of memory loss and mild cognitive impairment.  Delia Heady, MD  [facility] [address] [place], [place] [address]  Phone [phone] Fax [phone] Note: This document was prepared with digital dictation and possible smart phrase technology. Any transcriptional errors that result from this process are unintentional.

## 2022-12-22 NOTE — Patient Instructions (Addendum)
I had a long discussion with the patient regarding his new complaints of drooling from the corner of his mouth as well as resting tremors and mild bradykinesia which sound like poststroke parkinsonism.  I recommend a trial of Sinemet 25/one 100/2 a tablet twice daily for a week to be increased to 3 times daily as tolerated.  I have discussed possible side effects with him and advised him to call me if needed.  I also encouraged to increase the Aricept dose to 10 mg daily which is the recommended dose for his memory loss and mild cognitive impairment.  Increase participation in cognitively challenging activities like solving crossword puzzles, playing bridge and sudoku.  We also discussed memory compensation strategies.  Continue Plavix for stroke prevention with aggressive risk factor modification with strict control of hypertension with blood pressure goal below 130/90, lipids with LDL cholesterol goal below 70 mg percent and diabetes with hemoglobin A1c goal below 6.5%.  Return for follow-up in the future in 3 months with nurse practitioner or call earlier if necessary.  Memory Compensation Strategies  Use "WARM" strategy.  W= write it down  A= associate it  R= repeat it  M= make a mental note  2.   You can keep a Glass blower/designer.  Use a 3-ring notebook with sections for the following: calendar, important names and phone numbers,  medications, doctors' names/phone numbers, lists/reminders, and a section to journal what you did  each day.   3.    Use a calendar to write appointments down.  4.    Write yourself a schedule for the day.  This can be placed on the calendar or in a separate section of the Memory Notebook.  Keeping a  regular schedule can help memory.  5.    Use medication organizer with sections for each day or morning/evening pills.  You may need help loading it  6.    Keep a basket, or pegboard by the door.  Place items that you need to take out with you in the basket or on the  pegboard.  You may also want to  include a message board for reminders.  7.    Use sticky notes.  Place sticky notes with reminders in a place where the task is performed.  For example: " turn off the  stove" placed by the stove, "lock the door" placed on the door at eye level, " take your medications" on  the bathroom mirror or by the place where you normally take your medications.  8.    Use alarms/timers.  Use while cooking to remind yourself to check on food or as a reminder to take your medicine, or as a  reminder to make a call, or as a reminder to perform another task, etc.

## 2022-12-23 ENCOUNTER — Ambulatory Visit: Payer: Medicare Other | Admitting: Physical Therapy

## 2022-12-23 DIAGNOSIS — R2689 Other abnormalities of gait and mobility: Secondary | ICD-10-CM

## 2022-12-23 DIAGNOSIS — R2681 Unsteadiness on feet: Secondary | ICD-10-CM | POA: Diagnosis not present

## 2022-12-23 DIAGNOSIS — R293 Abnormal posture: Secondary | ICD-10-CM

## 2022-12-24 ENCOUNTER — Encounter: Payer: Self-pay | Admitting: Physical Therapy

## 2022-12-24 NOTE — Therapy (Signed)
OUTPATIENT PHYSICAL THERAPY NEURO TREATMENT   Patient Name: Larry Stone MRN: [id number] DOB:20-May-1950, 72 y.o., male Today's Date: 12/24/2022   PCP: Tresa Garter, MD REFERRING PROVIDER: Tresa Garter, MD  END OF SESSION:  PT End of Session - 12/24/22 1641     Visit Number 4    Number of Visits 17    Date for PT Re-Evaluation 02/18/23    Authorization Type [employer] medicare    PT Start Time 1104    PT Stop Time 1150    PT Time Calculation (min) 46 min    Equipment Utilized During Treatment Gait belt    Activity Tolerance Patient tolerated treatment well    Behavior During Therapy WFL for tasks assessed/performed              Past Medical History:  Diagnosis Date   Allergy    Anxiety    CAD    a. acute inferior lateral wall infarction in September 2004 treated medically. b.  cath 06/17/16 showing mild nonobstructive CAD with 30-40% ostial LM (eccentric), elevated LV filling pressures and normal LV function.   Chronic diastolic CHF (congestive heart failure) (HCC)    CKD (chronic kidney disease), stage II    COPD (chronic obstructive pulmonary disease) (HCC)    Diverticulosis    DJD (degenerative joint disease)    GERD (gastroesophageal reflux disease)    HTN (hypertension)    Hyperlipidemia    Low back pain syndrome    Myocardial infarction (HCC) 2004   Obesity    OSA (obstructive sleep apnea)    RBBB    Recurrent aspiration bronchitis/pneumonia    Recurrent aspiration pneumonia (HCC)    Hattie Perch 07/13/2016   Stroke (HCC)    Thrombocytopenia (HCC)    Tubular adenoma of colon 2014   Past Surgical History:  Procedure Laterality Date   CARDIAC CATHETERIZATION     LEFT HEART CATH AND CORONARY ANGIOGRAPHY N/A 06/17/2016   Procedure: Left Heart Cath and Coronary Angiography;  Surgeon: Kathleene Hazel, MD;  Location: [facility] INVASIVE CV LAB;  Service: Cardiovascular;  Laterality: N/A;   UVULOPALATOPHARYNGOPLASTY     surgery for OSA   Patient Active  Problem List   Diagnosis Date Noted   BPH (benign prostatic hyperplasia) 08/05/2022   Hearing problem 04/06/2022   COVID-19 12/10/2021   Cough 11/26/2021   Fever 11/26/2021   Wheezing 11/26/2021   Hamstring tendon rupture 06/24/2021   Hip pain, chronic, right 02/17/2021   Paresthesias 09/01/2020   Neck pain on left side 02/28/2020   Allergic rhinitis 12/26/2019   History of CVA (cerebrovascular accident) 12/05/2019   Right hemiparesis (HCC) 12/04/2019   Chronic renal insufficiency, stage 3 (moderate) (HCC) 07/05/2019   Mild cognitive disorder 10/12/2017   Viral pneumonitis 07/22/2016   COPD with asthma 07/22/2016   Depression with anxiety 07/13/2016   Acute respiratory failure with hypoxia (HCC) 07/13/2016   Chronic diastolic CHF (congestive heart failure) (HCC) 07/13/2016   Sepsis, unspecified organism (HCC) 07/13/2016   Acute pain of right knee 06/30/2016   Fatigue 06/30/2016   Creatinine elevation 06/30/2016   Dyspnea on exertion    Laryngopharyngeal reflux (LPR) 07/07/2015   COPD with acute exacerbation (HCC) 04/04/2015   Elevated IgE level 03/18/2014   Well adult exam 03/05/2014   COPD with chronic bronchitis 12/13/2013   Venous insufficiency 11/03/2011   Edema 11/03/2011   Memory disturbance 05/13/2011   CAD (coronary artery disease) 09/17/2010   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   Incidental pulmonary nodule  06/17/2009   COLONIC POLYPS 10/17/2007   BRONCHITIS, RECURRENT 10/17/2007   Diverticulosis of large intestine 10/17/2007   DEGENERATIVE JOINT DISEASE 10/17/2007   Hyperglycemia 10/17/2007   Dyslipidemia 10/02/2007   Anxiety disorder 10/02/2007   Obstructive sleep apnea 10/02/2007   Essential hypertension 10/02/2007   LOW BACK PAIN SYNDROME 10/02/2007    ONSET DATE: 11/17/2022  REFERRING DIAG: M54.40,G89.29 (ICD-10-CM) - Chronic midline low back pain with sciatica, sciatica laterality unspecified G81.91 (ICD-10-CM) - Right hemiparesis (HCC)  THERAPY  DIAG:  Unsteadiness on feet  Other abnormalities of gait and mobility  Abnormal posture  Rationale for Evaluation and Treatment: Rehabilitation  SUBJECTIVE:                                                                                                                                                                                             SUBJECTIVE STATEMENT: Patient reports he saw Dr. Pearlean Brownie on Wed. 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 Yes: NPRS scale: 5-6/10 Pain location: R hip    There were no vitals filed for this visit.   PRECAUTIONS: Fall  PATIENT GOALS: "I need to get my balance and gait back"   OBJECTIVE:   DIAGNOSTIC FINDINGS:  Lumbar mri 11/2020:  IMPRESSION: 1. Lumbar spine spondylosis as described above. 2. No acute osseous injury of the lumbar spine.  R hip MRI IMPRESSION: 1. Complete tear of the hamstring component of the adductor magnus tendon origin from the ischial tuberosity with approximately 2.4 cm retraction with fluid-filled gap. 2. Tendinosis with low-grade partial tearing of the semimembranosus tendon as well as the conjoint tendon of the long head biceps femoris and semitendinosus without a full-thickness or retracted component.   TODAY'S TREATMENT: 12-23-22  TherEx: Posture exercises - "W"  stretch for pectoral stretch - in corner 20 sec hold x 1 rep:  "Y" stretch at wall 20 sec hold for trunk elongation stretch - 1 rep  NeuroRe-ed:    Standing Balance: Surface: Airex and Floor Position: Narrow Base of Support Feet Hip Width Apart Completed with: Eyes Open and Eyes Closed; Head Turns x 5 Reps and Head Nods x 5 Reps; standing on floor with EC was very easy - even with head turns horizontally and vertically; instructed pt to perform  standing on foam with feet hip width apart with EC   Partial Tandem Stance:  Surface: Floor Completed with: Eyes Open; with UE support  prn at counter    Stepping forward with each leg with Large amplitude UE movement  - 10 reps each leg  Marching 5 reps; marching 5 reps with horizontal head turns with UE support on counter prn  Access Code: [email or web address] -   12-23-22 URL: [email or web address] Date: 12/24/2022 Prepared by: Maebelle Munroe  Exercises - Standing Marching  - 1 x daily - 7 x weekly - 1 sets - 10 reps - Single Leg Stance  - 1 x daily - 7 x weekly - 1 sets - 1-2 reps - 10 sec hold - Standing Romberg to 1/2 Tandem Stance  - 1 x daily - 7 x weekly - 1 sets - 2-3 reps - 30 secs hold - Step Forward with Arms Reaching to Sides  - 1 x daily - 7 x weekly - 1 sets - 10 reps  PATIENT EDUCATION: Education details: updated HEP - Medbridge  Person educated: Patient Education method: Explanation Education comprehension: verbalized understanding and needs further education  HOME EXERCISE PROGRAM: Balance: Eyes Open - Unilateral (Varied Surfaces)    Stand on left foot, eyes open. Maintain balance __30__ seconds. Repeat ___3_ times. Do __4__ sessions per week. Repeat on compliant surface: pillow.   **must have a chair/counter nearby to assist with balance **when this is easy, do eyes closed  Single Step: Forward / Backward    Lifting foot off floor, take one step slowly forward with right leg. Return to starting position. Take one step backward and return. Repeat __10__ times per session. Do __3__ sessions per day. Repeat with other leg.  **when keeping balance doing this is easy, stand on a pillow and step forward/backward  GOALS: Goals reviewed with patient? Yes  SHORT TERM GOALS: Target date: 01/07/23  Pt will be independent with initial HEP for improved balance and gait mechanics  Baseline: to be provided Goal status: INITIAL  2.  Pt will improve FGA to >/=  23/30 to demonstrate improved balance and reduced fall risk  Baseline: 18/30 Goal status: INITIAL  3.  Pt will improve gait speed to >/= 1.45m/s to demonstrate improved community ambulation  Baseline: 1.78m/s Goal status: INITIAL  4.  Pt will improve 5x STS to </= 10 sec to demo improved functional LE strength and balance   Baseline: 13.19s Goal status: INITIAL  5.  Patient will complete >/= 30s on condition 4 of MCTSIB to demonstrate improved balance Baseline:  23s Goal status: INITIAL  LONG TERM GOALS: Target date: 02/18/23  Pt will be independent with final HEP for improved balance and gait mechanics  Baseline: to be provided Goal status: INITIAL  Pt will improve FGA to >/= 23/30 to demonstrate improved balance and reduced fall risk  Baseline: 18/30 Goal status: INITIAL  Pt will improve gait speed to >/= 1.36m/s to demonstrate improved community ambulation  Baseline: 1.55m/s Goal status: INITIAL  Pt will improve 5x STS to </= 10 sec to demo improved functional LE strength and balance   Baseline: 13.19s Goal status: INITIAL  MCTSIB goal Baseline:  not needed Goal status: INITIAL  ASSESSMENT:  CLINICAL IMPRESSION: PT session focused on exercises to improve/facilitate upright posture and balance exercises to improve higher level balance skills.  Pt able to maintain balance standing on floor with feet together with EC and able to perform head turns without LOB.  Pt was issued exercise stepping forward with large amplitude arm movement to facilitate increased step length with large UE ROM.  Continue POC.   OBJECTIVE IMPAIRMENTS: Abnormal gait,  cardiopulmonary status limiting activity, decreased activity tolerance, decreased balance, decreased endurance, decreased knowledge of condition, difficulty walking, decreased strength, impaired flexibility, impaired sensation, postural dysfunction, and pain.   ACTIVITY LIMITATIONS: carrying, lifting, sitting, standing, stairs,  locomotion level, and caring for others  PARTICIPATION LIMITATIONS: interpersonal relationship, driving, shopping, community activity, and yard work  PERSONAL FACTORS: Age, Fitness, Past/current experiences, Time since onset of injury/illness/exacerbation, and 3+ comorbidities: see above  are also affecting patient's functional outcome.   REHAB POTENTIAL: Good  CLINICAL DECISION MAKING: Stable/uncomplicated  EVALUATION COMPLEXITY: Low  PLAN:  PT FREQUENCY: 2x/week  PT DURATION: 8 weeks  PLANNED INTERVENTIONS: Therapeutic exercises, Therapeutic activity, Neuromuscular re-education, Balance training, Gait training, Patient/Family education, Self Care, Joint mobilization, Stair training, Vestibular training, Visual/preceptual remediation/compensation, Orthotic/Fit training, DME instructions, Aquatic Therapy, Dry Needling, Electrical stimulation, Cryotherapy, Moist heat, Manual therapy, and Re-evaluation  PLAN FOR NEXT SESSION: cont balance and gait training   Izea Livolsi, Donavan Burnet, PT  12/24/2022, 4:48 PM

## 2022-12-25 ENCOUNTER — Other Ambulatory Visit: Payer: Self-pay | Admitting: Internal Medicine

## 2022-12-25 DIAGNOSIS — E785 Hyperlipidemia, unspecified: Secondary | ICD-10-CM

## 2022-12-28 ENCOUNTER — Ambulatory Visit: Payer: Medicare Other | Admitting: Physical Therapy

## 2022-12-28 DIAGNOSIS — R2681 Unsteadiness on feet: Secondary | ICD-10-CM | POA: Diagnosis not present

## 2022-12-28 DIAGNOSIS — R2689 Other abnormalities of gait and mobility: Secondary | ICD-10-CM

## 2022-12-29 ENCOUNTER — Ambulatory Visit: Payer: Self-pay | Admitting: Physical Therapy

## 2022-12-29 ENCOUNTER — Encounter: Payer: Self-pay | Admitting: Physical Therapy

## 2022-12-29 DIAGNOSIS — R2681 Unsteadiness on feet: Secondary | ICD-10-CM

## 2022-12-29 DIAGNOSIS — R2689 Other abnormalities of gait and mobility: Secondary | ICD-10-CM

## 2022-12-29 NOTE — Therapy (Signed)
OUTPATIENT PHYSICAL THERAPY NEURO TREATMENT   Patient Name: Larry Stone MRN: [id number] DOB:1950-08-20, 72 y.o., male Today's Date: 12/29/2022   PCP: Tresa Garter, MD REFERRING PROVIDER: Tresa Garter, MD  END OF SESSION:  PT End of Session - 12/29/22 1619     Visit Number 5    Number of Visits 17    Date for PT Re-Evaluation 02/18/23    Authorization Type [employer] medicare    PT Start Time 1103    PT Stop Time 1149    PT Time Calculation (min) 46 min    Equipment Utilized During Treatment Gait belt    Activity Tolerance Patient tolerated treatment well    Behavior During Therapy WFL for tasks assessed/performed              Past Medical History:  Diagnosis Date   Allergy    Anxiety    CAD    a. acute inferior lateral wall infarction in September 2004 treated medically. b.  cath 06/17/16 showing mild nonobstructive CAD with 30-40% ostial LM (eccentric), elevated LV filling pressures and normal LV function.   Chronic diastolic CHF (congestive heart failure) (HCC)    CKD (chronic kidney disease), stage II    COPD (chronic obstructive pulmonary disease) (HCC)    Diverticulosis    DJD (degenerative joint disease)    GERD (gastroesophageal reflux disease)    HTN (hypertension)    Hyperlipidemia    Low back pain syndrome    Myocardial infarction (HCC) 2004   Obesity    OSA (obstructive sleep apnea)    RBBB    Recurrent aspiration bronchitis/pneumonia    Recurrent aspiration pneumonia (HCC)    Hattie Perch 07/13/2016   Stroke (HCC)    Thrombocytopenia (HCC)    Tubular adenoma of colon 2014   Past Surgical History:  Procedure Laterality Date   CARDIAC CATHETERIZATION     LEFT HEART CATH AND CORONARY ANGIOGRAPHY N/A 06/17/2016   Procedure: Left Heart Cath and Coronary Angiography;  Surgeon: Kathleene Hazel, MD;  Location: [facility] INVASIVE CV LAB;  Service: Cardiovascular;  Laterality: N/A;   UVULOPALATOPHARYNGOPLASTY     surgery for OSA   Patient Active  Problem List   Diagnosis Date Noted   BPH (benign prostatic hyperplasia) 08/05/2022   Hearing problem 04/06/2022   COVID-19 12/10/2021   Cough 11/26/2021   Fever 11/26/2021   Wheezing 11/26/2021   Hamstring tendon rupture 06/24/2021   Hip pain, chronic, right 02/17/2021   Paresthesias 09/01/2020   Neck pain on left side 02/28/2020   Allergic rhinitis 12/26/2019   History of CVA (cerebrovascular accident) 12/05/2019   Right hemiparesis (HCC) 12/04/2019   Chronic renal insufficiency, stage 3 (moderate) (HCC) 07/05/2019   Mild cognitive disorder 10/12/2017   Viral pneumonitis 07/22/2016   COPD with asthma 07/22/2016   Depression with anxiety 07/13/2016   Acute respiratory failure with hypoxia (HCC) 07/13/2016   Chronic diastolic CHF (congestive heart failure) (HCC) 07/13/2016   Sepsis, unspecified organism (HCC) 07/13/2016   Acute pain of right knee 06/30/2016   Fatigue 06/30/2016   Creatinine elevation 06/30/2016   Dyspnea on exertion    Laryngopharyngeal reflux (LPR) 07/07/2015   COPD with acute exacerbation (HCC) 04/04/2015   Elevated IgE level 03/18/2014   Well adult exam 03/05/2014   COPD with chronic bronchitis 12/13/2013   Venous insufficiency 11/03/2011   Edema 11/03/2011   Memory disturbance 05/13/2011   CAD (coronary artery disease) 09/17/2010   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   Incidental pulmonary nodule  06/17/2009   COLONIC POLYPS 10/17/2007   BRONCHITIS, RECURRENT 10/17/2007   Diverticulosis of large intestine 10/17/2007   DEGENERATIVE JOINT DISEASE 10/17/2007   Hyperglycemia 10/17/2007   Dyslipidemia 10/02/2007   Anxiety disorder 10/02/2007   Obstructive sleep apnea 10/02/2007   Essential hypertension 10/02/2007   LOW BACK PAIN SYNDROME 10/02/2007    ONSET DATE: 11/17/2022  REFERRING DIAG: M54.40,G89.29 (ICD-10-CM) - Chronic midline low back pain with sciatica, sciatica laterality unspecified G81.91 (ICD-10-CM) - Right hemiparesis (HCC)  THERAPY  DIAG:  Unsteadiness on feet  Other abnormalities of gait and mobility  Rationale for Evaluation and Treatment: Rehabilitation  SUBJECTIVE:                                                                                                                                                                                             SUBJECTIVE STATEMENT: Patient reports he has been working on walking more at home and also trying to take bigger steps; pt states he feels his balance is a little better but says he continues to have a lot of difficulty doing the exercises with his eyes closed Pt accompanied by: self  PERTINENT HISTORY: BPH, h/o CVA, anxiety, GERD, CHF, MI, COPD, HTN, HLD, CKD  PAIN:  Are you having pain? Yes: NPRS scale: 5-6/10 Pain location: R hip    There were no vitals filed for this visit.   PRECAUTIONS: Fall  PATIENT GOALS: "I need to get my balance and gait back"   OBJECTIVE:   DIAGNOSTIC FINDINGS:  Lumbar mri 11/2020:  IMPRESSION: 1. Lumbar spine spondylosis as described above. 2. No acute osseous injury of the lumbar spine.  R hip MRI IMPRESSION: 1. Complete tear of the hamstring component of the adductor magnus tendon origin from the ischial tuberosity with approximately 2.4 cm retraction with fluid-filled gap. 2. Tendinosis with low-grade partial tearing of the semimembranosus tendon as well as the conjoint tendon of the long head biceps femoris and semitendinosus without a full-thickness or retracted component.   TODAY'S TREATMENT: 12-28-22  NeuroRe-ed:    Standing Balance: Surface: Airex and Floor; also 1 pillow used  Position: Narrow Base of Support Feet Hip Width Apart Completed with: Eyes Open and Eyes Closed; Head Turns x 5 Reps and Head Nods x 5 Reps; standing on floor with EC was very easy - even with head turns horizontally and vertically; instructed pt to perform standing on foam with feet hip width apart with EC   Pt performed stepping  down to floor from pillow with head turn to opposite side with CGA - 5 reps to Rt side, 5 reps to Lt side  Partial Tandem Stance:  Surface: Floor Completed with: Eyes Open; with UE support prn at counter; added horizontal head turns 5 reps with UE support prn on counter  Tandem stance 10' x 2 reps inside // bars  Marching on floor - EO - 10 reps without UE support;  horizontal head turns    Pt performed crossovers 10' x 2 reps inside // bars;  10' x 2 reps braiding for improved coordination and balance - with UE support on // bar prn  Sidestepping on compliant balance beam inside // bars 10' x 4 reps with UE support prn  Access Code: [email or web address] -   12-23-22 URL: [email or web address] Date: 12/24/2022 Prepared by: Maebelle Munroe  Exercises - Standing Marching  - 1 x daily - 7 x weekly - 1 sets - 10 reps - Single Leg Stance  - 1 x daily - 7 x weekly - 1 sets - 1-2 reps - 10 sec hold - Standing Romberg to 1/2 Tandem Stance  - 1 x daily - 7 x weekly - 1 sets - 2-3 reps - 30 secs hold - Step Forward with Arms Reaching to Sides  - 1 x daily - 7 x weekly - 1 sets - 10 reps  PATIENT EDUCATION: Education details: updated HEP - Medbridge  Person educated: Patient Education method: Explanation Education comprehension: verbalized understanding and needs further education  HOME EXERCISE PROGRAM: Balance: Eyes Open - Unilateral (Varied Surfaces)    Stand on left foot, eyes open. Maintain balance __30__ seconds. Repeat ___3_ times. Do __4__ sessions per week. Repeat on compliant surface: pillow.   **must have a chair/counter nearby to assist with balance **when this is easy, do eyes closed  Single Step: Forward / Backward    Lifting foot off floor, take one step slowly forward with right leg. Return to starting position. Take one step backward and return. Repeat __10__ times per session. Do __3__ sessions per day. Repeat with other leg.  **when keeping balance doing this is  easy, stand on a pillow and step forward/backward  GOALS: Goals reviewed with patient? Yes  SHORT TERM GOALS: Target date: 01/07/23  Pt will be independent with initial HEP for improved balance and gait mechanics  Baseline: to be provided Goal status: INITIAL  2.  Pt will improve FGA to >/= 23/30 to demonstrate improved balance and reduced fall risk  Baseline: 18/30 Goal status: INITIAL  3.  Pt will improve gait speed to >/= 1.24m/s to demonstrate improved community ambulation  Baseline: 1.79m/s Goal status: INITIAL  4.  Pt will improve 5x STS to </= 10 sec to demo improved functional LE strength and balance   Baseline: 13.19s Goal status: INITIAL  5.  Patient will complete >/= 30s on condition 4 of MCTSIB to demonstrate improved balance Baseline:  23s Goal status: INITIAL  LONG TERM GOALS: Target date: 02/18/23  Pt will be independent with final HEP for improved balance and gait mechanics  Baseline: to be provided Goal status: INITIAL  Pt will improve FGA to >/= 23/30 to demonstrate improved balance and reduced fall risk  Baseline: 18/30 Goal status: INITIAL  Pt will improve gait speed to >/= 1.40m/s to demonstrate improved community ambulation  Baseline: 1.16m/s Goal status: INITIAL  Pt will improve 5x STS to </= 10 sec to demo improved functional LE strength and balance   Baseline: 13.19s Goal status: INITIAL  MCTSIB goal Baseline:  not needed Goal status: INITIAL  ASSESSMENT:  CLINICAL IMPRESSION: PT session focused on balance training  on compliant and noncompliant surfaces.  Pt's gait pattern is improved with pt ambulating with increased step length.  Pt continues to have increased sway with maintaining balance on compliant surfaces with EC.   Continue POC.   OBJECTIVE IMPAIRMENTS: Abnormal gait, cardiopulmonary status limiting activity, decreased activity tolerance, decreased balance, decreased endurance, decreased knowledge of condition, difficulty  walking, decreased strength, impaired flexibility, impaired sensation, postural dysfunction, and pain.   ACTIVITY LIMITATIONS: carrying, lifting, sitting, standing, stairs, locomotion level, and caring for others  PARTICIPATION LIMITATIONS: interpersonal relationship, driving, shopping, community activity, and yard work  PERSONAL FACTORS: Age, Fitness, Past/current experiences, Time since onset of injury/illness/exacerbation, and 3+ comorbidities: see above  are also affecting patient's functional outcome.   REHAB POTENTIAL: Good  CLINICAL DECISION MAKING: Stable/uncomplicated  EVALUATION COMPLEXITY: Low  PLAN:  PT FREQUENCY: 2x/week  PT DURATION: 8 weeks  PLANNED INTERVENTIONS: Therapeutic exercises, Therapeutic activity, Neuromuscular re-education, Balance training, Gait training, Patient/Family education, Self Care, Joint mobilization, Stair training, Vestibular training, Visual/preceptual remediation/compensation, Orthotic/Fit training, DME instructions, Aquatic Therapy, Dry Needling, Electrical stimulation, Cryotherapy, Moist heat, Manual therapy, and Re-evaluation  PLAN FOR NEXT SESSION: cont balance and gait training   Derwin Reddy, Donavan Burnet, PT  12/29/2022, 4:21 PM

## 2022-12-30 ENCOUNTER — Ambulatory Visit: Payer: Medicare Other | Admitting: Physical Therapy

## 2022-12-31 ENCOUNTER — Encounter: Payer: Self-pay | Admitting: Physical Therapy

## 2022-12-31 NOTE — Therapy (Signed)
OUTPATIENT PHYSICAL THERAPY NEURO TREATMENT   Patient Name: DEPAUL JACKOVICH MRN: [id number] DOB:[birth year], 72 y.o., male Today's Date: 12/31/2022   PCP: Tresa Garter, MD REFERRING PROVIDER: Tresa Garter, MD  END OF SESSION:  PT End of Session - 12/31/22 1231     Visit Number 6    Number of Visits 17    Date for PT Re-Evaluation 02/18/23    Authorization Type [employer] medicare    PT Start Time 1315    PT Stop Time 1405    PT Time Calculation (min) 50 min    Equipment Utilized During Treatment Other (comment)   pool noodle, aquatic cuff   Activity Tolerance Patient tolerated treatment well    Behavior During Therapy [facility] for tasks assessed/performed              Past Medical History:  Diagnosis Date   Allergy    Anxiety    CAD    a. acute inferior lateral wall infarction in September 2004 treated medically. b.  cath 06/17/16 showing mild nonobstructive CAD with 30-40% ostial LM (eccentric), elevated LV filling pressures and normal LV function.   Chronic diastolic CHF (congestive heart failure) (HCC)    CKD (chronic kidney disease), stage II    COPD (chronic obstructive pulmonary disease) (HCC)    Diverticulosis    DJD (degenerative joint disease)    GERD (gastroesophageal reflux disease)    HTN (hypertension)    Hyperlipidemia    Low back pain syndrome    Myocardial infarction (HCC) 2004   Obesity    OSA (obstructive sleep apnea)    RBBB    Recurrent aspiration bronchitis/pneumonia    Recurrent aspiration pneumonia (HCC)    Hattie Perch 07/13/2016   Stroke (HCC)    Thrombocytopenia (HCC)    Tubular adenoma of colon 2014   Past Surgical History:  Procedure Laterality Date   CARDIAC CATHETERIZATION     LEFT HEART CATH AND CORONARY ANGIOGRAPHY N/A 06/17/2016   Procedure: Left Heart Cath and Coronary Angiography;  Surgeon: Kathleene Hazel, MD;  Location: [facility] INVASIVE CV LAB;  Service: Cardiovascular;  Laterality: N/A;   UVULOPALATOPHARYNGOPLASTY      surgery for OSA   Patient Active Problem List   Diagnosis Date Noted   BPH (benign prostatic hyperplasia) 08/05/2022   Hearing problem 04/06/2022   COVID-19 12/10/2021   Cough 11/26/2021   Fever 11/26/2021   Wheezing 11/26/2021   Hamstring tendon rupture 06/24/2021   Hip pain, chronic, right 02/17/2021   Paresthesias 09/01/2020   Neck pain on left side 02/28/2020   Allergic rhinitis 12/26/2019   History of CVA (cerebrovascular accident) 12/05/2019   Right hemiparesis (HCC) 12/04/2019   Chronic renal insufficiency, stage 3 (moderate) (HCC) 07/05/2019   Mild cognitive disorder 10/12/2017   Viral pneumonitis 07/22/2016   COPD with asthma 07/22/2016   Depression with anxiety 07/13/2016   Acute respiratory failure with hypoxia (HCC) 07/13/2016   Chronic diastolic CHF (congestive heart failure) (HCC) 07/13/2016   Sepsis, unspecified organism (HCC) 07/13/2016   Acute pain of right knee 06/30/2016   Fatigue 06/30/2016   Creatinine elevation 06/30/2016   Dyspnea on exertion    Laryngopharyngeal reflux (LPR) 07/07/2015   COPD with acute exacerbation (HCC) 04/04/2015   Elevated IgE level 03/18/2014   Well adult exam 03/05/2014   COPD with chronic bronchitis 12/13/2013   Venous insufficiency 11/03/2011   Edema 11/03/2011   Memory disturbance 05/13/2011   CAD (coronary artery disease) 09/17/2010   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  Incidental pulmonary nodule 06/17/2009   COLONIC POLYPS 10/17/2007   BRONCHITIS, RECURRENT 10/17/2007   Diverticulosis of large intestine 10/17/2007   DEGENERATIVE JOINT DISEASE 10/17/2007   Hyperglycemia 10/17/2007   Dyslipidemia 10/02/2007   Anxiety disorder 10/02/2007   Obstructive sleep apnea 10/02/2007   Essential hypertension 10/02/2007   LOW BACK PAIN SYNDROME 10/02/2007    ONSET DATE: 11/17/2022  REFERRING DIAG: M54.40,G89.29 (ICD-10-CM) - Chronic midline low back pain with sciatica, sciatica laterality unspecified G81.91 (ICD-10-CM) -  Right hemiparesis (HCC)  THERAPY DIAG:  Unsteadiness on feet  Other abnormalities of gait and mobility  Rationale for Evaluation and Treatment: Rehabilitation  SUBJECTIVE:                                                                                                                                                                                             SUBJECTIVE STATEMENT: Patient reports he has been working on walking more at home and also trying to take bigger steps; pt states he feels his balance is a little better but says he continues to have a lot of difficulty doing the exercises with his eyes closed Pt accompanied by: self  PERTINENT HISTORY: BPH, h/o CVA, anxiety, GERD, CHF, MI, COPD, HTN, HLD, CKD  PAIN:  Are you having pain? Yes: NPRS scale: 5-6/10 Pain location: R hip    There were no vitals filed for this visit.   PRECAUTIONS: Fall  PATIENT GOALS: "I need to get my balance and gait back"   OBJECTIVE:   DIAGNOSTIC FINDINGS:  Lumbar mri 11/2020:  IMPRESSION: 1. Lumbar spine spondylosis as described above. 2. No acute osseous injury of the lumbar spine.  R hip MRI IMPRESSION: 1. Complete tear of the hamstring component of the adductor magnus tendon origin from the ischial tuberosity with approximately 2.4 cm retraction with fluid-filled gap. 2. Tendinosis with low-grade partial tearing of the semimembranosus tendon as well as the conjoint tendon of the long head biceps femoris and semitendinosus without a full-thickness or retracted component.   TODAY'S TREATMENT: 12-29-22  Aquatic therapy at [facility] - pool temp 90 degrees  Patient seen for aquatic therapy today.  Treatment took place in water 3.6- 4.5 feet deep depending upon activity.  Pt entered & exited the pool via step negotiation with use of hand rails independently.  Pt performed runner's stretch RLE and LLE 20 sec hold x 1 rep each;  gastroc stretch with bil. Forefeet on pool wall 20 sec  hold x 1 rep  Warm up - forwards 18' x 4 reps, backwards and sideways ambulation 18' x 4 reps each direction  Marching slowly in place  with contralateral UE flexion 10 reps each leg; progressed to adding barbells in each hand for increased resistance 10 reps  Marching forward across pool with barbells 18' x 2 reps - 2 sec hold for improved SLS on stance leg; marching backwards 18' x 2 reps with bar bells in each hand for contralateral   Pt performed standing hip flexion with knee extended with bil. Horizontal shoulder adduction/abduction 10 reps each; hip abduction with shoulder abduction/adduction with bar bells 10 reps  Stepping strategy - forwards, back and sidestepping 5 reps each direction each leg with UE movement with barbells for improved core stabilization  Sidestepping with squats 18' x 4 reps across pool - with large (yellow barbells) for increased resistance for increased core stabilization  Core stabilization exercises; pt stood with feet together - held small bar bells; performed scapular protraction/retraction 10 reps; horizontal shoulder abduction/adduction with cues to keep core tight 10 reps; scapular protraction/retraction with small bar bells for increased resistance 10 reps  Trunk rotation with use of large yellow noodle for lateral trunk stretching - Rt and Lt sides - approx. 10 reps to each side  Pt performed hip flexion, abduction and extension with aquatic cuff for increased resistance with eccentric contraction 10 reps each leg each direction   Pt requires buoyancy of water for support for reduced fall risk and for unloading/reduced stress on joints and spine as pt able to tolerate increased standing and ambulation in water compared to tolerance on land.  Viscosity of water is needed for resistance for strengthening and current of water provides perturbations for challenge for balance training: buoyancy of water needed for spinal decompression for reduced pain with  weight bearing activities & exercises in water.                                                                                                                  GOALS: Goals reviewed with patient? Yes  SHORT TERM GOALS: Target date: 01/07/23  Pt will be independent with initial HEP for improved balance and gait mechanics  Baseline: to be provided Goal status: INITIAL  2.  Pt will improve FGA to >/= 23/30 to demonstrate improved balance and reduced fall risk  Baseline: 18/30 Goal status: INITIAL  3.  Pt will improve gait speed to >/= 1.30m/s to demonstrate improved community ambulation  Baseline: 1.35m/s Goal status: INITIAL  4.  Pt will improve 5x STS to </= 10 sec to demo improved functional LE strength and balance   Baseline: 13.19s Goal status: INITIAL  5.  Patient will complete >/= 30s on condition 4 of MCTSIB to demonstrate improved balance Baseline:  23s Goal status: INITIAL  LONG TERM GOALS: Target date: 02/18/23  Pt will be independent with final HEP for improved balance and gait mechanics  Baseline: to be provided Goal status: INITIAL  Pt will improve FGA to >/= 23/30 to demonstrate improved balance and reduced fall risk  Baseline: 18/30 Goal status: INITIAL  Pt will improve gait speed to >/= 1.78m/s to demonstrate  improved community ambulation  Baseline: 1.26m/s Goal status: INITIAL  Pt will improve 5x STS to </= 10 sec to demo improved functional LE strength and balance   Baseline: 13.19s Goal status: INITIAL  MCTSIB goal Baseline:  not needed Goal status: INITIAL  ASSESSMENT:  CLINICAL IMPRESSION: Aquatic PT session focused on balance and gait training in 4' - 4.5' water depth without use of floatation device for UE support.  Pt had some minimal unsteadiness with core stabilization exercises but was able to recover balance independently.  Pt tolerated aquatic exercises well.  Continue POC.   OBJECTIVE IMPAIRMENTS: Abnormal gait,  cardiopulmonary status limiting activity, decreased activity tolerance, decreased balance, decreased endurance, decreased knowledge of condition, difficulty walking, decreased strength, impaired flexibility, impaired sensation, postural dysfunction, and pain.   ACTIVITY LIMITATIONS: carrying, lifting, sitting, standing, stairs, locomotion level, and caring for others  PARTICIPATION LIMITATIONS: interpersonal relationship, driving, shopping, community activity, and yard work  PERSONAL FACTORS: Age, Fitness, Past/current experiences, Time since onset of injury/illness/exacerbation, and 3+ comorbidities: see above  are also affecting patient's functional outcome.   REHAB POTENTIAL: Good  CLINICAL DECISION MAKING: Stable/uncomplicated  EVALUATION COMPLEXITY: Low  PLAN:  PT FREQUENCY: 2x/week  PT DURATION: 8 weeks  PLANNED INTERVENTIONS: Therapeutic exercises, Therapeutic activity, Neuromuscular re-education, Balance training, Gait training, Patient/Family education, Self Care, Joint mobilization, Stair training, Vestibular training, Visual/preceptual remediation/compensation, Orthotic/Fit training, DME instructions, Aquatic Therapy, Dry Needling, Electrical stimulation, Cryotherapy, Moist heat, Manual therapy, and Re-evaluation  PLAN FOR NEXT SESSION: cont balance and gait training   Aylani Spurlock, Donavan Burnet, PT  12/31/2022, 12:34 PM

## 2023-01-04 ENCOUNTER — Ambulatory Visit: Payer: Medicare Other | Admitting: Physical Therapy

## 2023-01-04 DIAGNOSIS — R2689 Other abnormalities of gait and mobility: Secondary | ICD-10-CM

## 2023-01-04 DIAGNOSIS — R2681 Unsteadiness on feet: Secondary | ICD-10-CM | POA: Diagnosis not present

## 2023-01-04 NOTE — Therapy (Unsigned)
OUTPATIENT PHYSICAL THERAPY NEURO TREATMENT   Patient Name: Larry Stone MRN: [id number] DOB:19-Mar-1951, 72 y.o., male Today's Date: 01/05/2023   PCP: Tresa Garter, MD REFERRING PROVIDER: Tresa Garter, MD  END OF SESSION:  PT End of Session - 01/05/23 1002     Visit Number 7    Number of Visits 17    Date for PT Re-Evaluation 02/18/23    Authorization Type [employer] medicare    PT Start Time 1105    PT Stop Time 1150    PT Time Calculation (min) 45 min    Activity Tolerance Patient tolerated treatment well    Behavior During Therapy [facility] for tasks assessed/performed               Past Medical History:  Diagnosis Date   Allergy    Anxiety    CAD    a. acute inferior lateral wall infarction in September 2004 treated medically. b.  cath 06/17/16 showing mild nonobstructive CAD with 30-40% ostial LM (eccentric), elevated LV filling pressures and normal LV function.   Chronic diastolic CHF (congestive heart failure) (HCC)    CKD (chronic kidney disease), stage II    COPD (chronic obstructive pulmonary disease) (HCC)    Diverticulosis    DJD (degenerative joint disease)    GERD (gastroesophageal reflux disease)    HTN (hypertension)    Hyperlipidemia    Low back pain syndrome    Myocardial infarction (HCC) 2004   Obesity    OSA (obstructive sleep apnea)    RBBB    Recurrent aspiration bronchitis/pneumonia    Recurrent aspiration pneumonia (HCC)    Hattie Perch 07/13/2016   Stroke (HCC)    Thrombocytopenia (HCC)    Tubular adenoma of colon 2014   Past Surgical History:  Procedure Laterality Date   CARDIAC CATHETERIZATION     LEFT HEART CATH AND CORONARY ANGIOGRAPHY N/A 06/17/2016   Procedure: Left Heart Cath and Coronary Angiography;  Surgeon: Kathleene Hazel, MD;  Location: [facility] INVASIVE CV LAB;  Service: Cardiovascular;  Laterality: N/A;   UVULOPALATOPHARYNGOPLASTY     surgery for OSA   Patient Active Problem List   Diagnosis Date Noted   BPH  (benign prostatic hyperplasia) 08/05/2022   Hearing problem 04/06/2022   COVID-19 12/10/2021   Cough 11/26/2021   Fever 11/26/2021   Wheezing 11/26/2021   Hamstring tendon rupture 06/24/2021   Hip pain, chronic, right 02/17/2021   Paresthesias 09/01/2020   Neck pain on left side 02/28/2020   Allergic rhinitis 12/26/2019   History of CVA (cerebrovascular accident) 12/05/2019   Right hemiparesis (HCC) 12/04/2019   Chronic renal insufficiency, stage 3 (moderate) (HCC) 07/05/2019   Mild cognitive disorder 10/12/2017   Viral pneumonitis 07/22/2016   COPD with asthma 07/22/2016   Depression with anxiety 07/13/2016   Acute respiratory failure with hypoxia (HCC) 07/13/2016   Chronic diastolic CHF (congestive heart failure) (HCC) 07/13/2016   Sepsis, unspecified organism (HCC) 07/13/2016   Acute pain of right knee 06/30/2016   Fatigue 06/30/2016   Creatinine elevation 06/30/2016   Dyspnea on exertion    Laryngopharyngeal reflux (LPR) 07/07/2015   COPD with acute exacerbation (HCC) 04/04/2015   Elevated IgE level 03/18/2014   Well adult exam 03/05/2014   COPD with chronic bronchitis 12/13/2013   Venous insufficiency 11/03/2011   Edema 11/03/2011   Memory disturbance 05/13/2011   CAD (coronary artery disease) 09/17/2010   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   Incidental pulmonary nodule 06/17/2009   COLONIC POLYPS 10/17/2007  BRONCHITIS, RECURRENT 10/17/2007   Diverticulosis of large intestine 10/17/2007   DEGENERATIVE JOINT DISEASE 10/17/2007   Hyperglycemia 10/17/2007   Dyslipidemia 10/02/2007   Anxiety disorder 10/02/2007   Obstructive sleep apnea 10/02/2007   Essential hypertension 10/02/2007   LOW BACK PAIN SYNDROME 10/02/2007    ONSET DATE: 11/17/2022  REFERRING DIAG: M54.40,G89.29 (ICD-10-CM) - Chronic midline low back pain with sciatica, sciatica laterality unspecified G81.91 (ICD-10-CM) - Right hemiparesis (HCC)  THERAPY DIAG:  Unsteadiness on feet  Other  abnormalities of gait and mobility  Rationale for Evaluation and Treatment: Rehabilitation  SUBJECTIVE:                                                                                                                                                                                             SUBJECTIVE STATEMENT: Patient reports he went to the Yoga class at [facility] on Monday - says he was disappointed because he had a hard time in doing some of the positions - stayed in his chair for entire time - pt asks if it is worthwhile in continuing with this since he was so limited compared to the other participants' abilities.  Pt was recommended to at least attend and trial 1 more class before he discontinues it.  Pt was given another copy of the HEP issued on 2nd visit - says he misplaced the papers Pt accompanied by: self  PERTINENT HISTORY: BPH, h/o CVA, anxiety, GERD, CHF, MI, COPD, HTN, HLD, CKD  PAIN:  Are you having pain? Yes: NPRS scale: 5-6/10 Pain location: R hip    There were no vitals filed for this visit.   PRECAUTIONS: Fall  PATIENT GOALS: "I need to get my balance and gait back"   OBJECTIVE:   DIAGNOSTIC FINDINGS:  Lumbar mri 11/2020:  IMPRESSION: 1. Lumbar spine spondylosis as described above. 2. No acute osseous injury of the lumbar spine.  R hip MRI IMPRESSION: 1. Complete tear of the hamstring component of the adductor magnus tendon origin from the ischial tuberosity with approximately 2.4 cm retraction with fluid-filled gap. 2. Tendinosis with low-grade partial tearing of the semimembranosus tendon as well as the conjoint tendon of the long head biceps femoris and semitendinosus without a full-thickness or retracted component.   TODAY'S TREATMENT: 01-04-23  NeuroRe-ed:    Pt stood on [employer] with feet together for 30 secs for STG assessment - no LOB occurred  5 times sit to stand - from mat without UE support - 12.78 secs  Gait: Gait velocity 7.41 secs =  1.45m/sec (2nd trial);  1st trial 9.37 secs = 1.07 m/sec   FGA: score 22/30  01/04/23 0001  Functional Gait  Assessment  Gait assessed  Yes  Gait Level Surface 3  Change in Gait Speed 3  Gait with Horizontal Head Turns 2  Gait with Vertical Head Turns 2  Gait and Pivot Turn 3  Step Over Obstacle 2  Gait with Narrow Base of Support 2  Gait with Eyes Closed 1  Ambulating Backwards 2  Steps 2  Total Score 22    Access Code: [email or web address] -   12-23-22 URL: [email or web address] Date: 12/24/2022 Prepared by: Maebelle Munroe  Exercises - Standing Marching  - 1 x daily - 7 x weekly - 1 sets - 10 reps - Single Leg Stance  - 1 x daily - 7 x weekly - 1 sets - 1-2 reps - 10 sec hold - Standing Romberg to 1/2 Tandem Stance  - 1 x daily - 7 x weekly - 1 sets - 2-3 reps - 30 secs hold - Step Forward with Arms Reaching to Sides  - 1 x daily - 7 x weekly - 1 sets - 10 reps  PATIENT EDUCATION: Education details: updated HEP - [facility]  Person educated: Patient Education method: Explanation Education comprehension: verbalized understanding and needs further education  HOME EXERCISE PROGRAM: Balance: Eyes Open - Unilateral (Varied Surfaces)    Stand on left foot, eyes open. Maintain balance __30__ seconds. Repeat ___3_ times. Do __4__ sessions per week. Repeat on compliant surface: pillow.   **must have a chair/counter nearby to assist with balance **when this is easy, do eyes closed  Single Step: Forward / Backward    Lifting foot off floor, take one step slowly forward with right leg. Return to starting position. Take one step backward and return. Repeat __10__ times per session. Do __3__ sessions per day. Repeat with other leg.  **when keeping balance doing this is easy, stand on a pillow and step forward/backward  GOALS: Goals reviewed with patient? Yes  SHORT TERM GOALS: Target date: 01/07/23  Pt will be independent with initial HEP for improved balance and gait  mechanics  Baseline: to be provided Goal status: Goal met   2.  Pt will improve FGA to >/= 23/30 to demonstrate improved balance and reduced fall risk  Baseline: 18/30;  01-04-23 - score 22/30 Goal status:  Partially met 01-04-23  3.  Pt will improve gait speed to >/= 1.32m/s to demonstrate improved community ambulation  Baseline: 1.107m/s;  9.37 secs = 1.07 m/sec ;  2nd trial  7.41 secs = 1.35 m/sec Goal status: Goal met 01-04-23  4.  Pt will improve 5x STS to </= 10 sec to demo improved functional LE strength and balance   Baseline: 13.19s; 01-04-23:   12.78 secs without UE support Goal status:  Partially met 01-04-23  5.  Patient will complete >/= 30s on condition 4 of MCTSIB to demonstrate improved balance Baseline:  23s:  30 secs on 01-04-23 Goal status: Goal met   LONG TERM GOALS: Target date: 02/18/23  Pt will be independent with final HEP for improved balance and gait mechanics  Baseline: to be provided Goal status: INITIAL  Pt will improve FGA to >/= 25/30 to demonstrate improved balance and reduced fall risk  Baseline: 18/30 Goal status: INITIAL  Pt will improve gait speed to >/= 1.80m/s to demonstrate improved community ambulation  Baseline: 1.26m/s Goal status: INITIAL  Pt will improve 5x STS to </= 10 sec to demo improved functional LE strength and balance   Baseline: 13.19s Goal status: INITIAL  MCTSIB goal  Baseline:  not needed Goal status: INITIAL  ASSESSMENT:  CLINICAL IMPRESSION: PT session focused on assessment of STG's.  Pt has met STG's #1, 3 and 5:  STG #2 partially met as FGA score has increased from 18/30 to 22/30 (goal 23/30);  STG #4 partially met as 5x sit to stand score has improved from 13.19 secs to 12.78 secs (goal set for <10 secs).  Pt's gait pattern has improved with less deviations noted as pt is now ambulating with increased step length and increased reciprocal arm swing.   Continue POC.   OBJECTIVE IMPAIRMENTS: Abnormal gait,  cardiopulmonary status limiting activity, decreased activity tolerance, decreased balance, decreased endurance, decreased knowledge of condition, difficulty walking, decreased strength, impaired flexibility, impaired sensation, postural dysfunction, and pain.   ACTIVITY LIMITATIONS: carrying, lifting, sitting, standing, stairs, locomotion level, and caring for others  PARTICIPATION LIMITATIONS: interpersonal relationship, driving, shopping, community activity, and yard work  PERSONAL FACTORS: Age, Fitness, Past/current experiences, Time since onset of injury/illness/exacerbation, and 3+ comorbidities: see above  are also affecting patient's functional outcome.   REHAB POTENTIAL: Good  CLINICAL DECISION MAKING: Stable/uncomplicated  EVALUATION COMPLEXITY: Low  PLAN:  PT FREQUENCY: 2x/week  PT DURATION: 8 weeks  PLANNED INTERVENTIONS: Therapeutic exercises, Therapeutic activity, Neuromuscular re-education, Balance training, Gait training, Patient/Family education, Self Care, Joint mobilization, Stair training, Vestibular training, Visual/preceptual remediation/compensation, Orthotic/Fit training, DME instructions, Aquatic Therapy, Dry Needling, Electrical stimulation, Cryotherapy, Moist heat, Manual therapy, and Re-evaluation  PLAN FOR NEXT SESSION: cont balance and gait training   Amry Cathy, Donavan Burnet, PT  01/05/2023, 10:05 AM

## 2023-01-05 ENCOUNTER — Encounter: Payer: Self-pay | Admitting: Physical Therapy

## 2023-01-05 ENCOUNTER — Ambulatory Visit: Payer: Medicare Other | Admitting: Physical Therapy

## 2023-01-05 DIAGNOSIS — R2681 Unsteadiness on feet: Secondary | ICD-10-CM | POA: Diagnosis not present

## 2023-01-05 DIAGNOSIS — R2689 Other abnormalities of gait and mobility: Secondary | ICD-10-CM

## 2023-01-05 DIAGNOSIS — M6281 Muscle weakness (generalized): Secondary | ICD-10-CM

## 2023-01-05 NOTE — Therapy (Signed)
OUTPATIENT PHYSICAL THERAPY NEURO TREATMENT/AQUATIC THERAPY   Patient Name: Larry Stone MRN: [id number] DOB:1951-02-23, 72 y.o., male Today's Date: 01/05/2023   PCP: Tresa Garter, MD REFERRING PROVIDER: Tresa Garter, MD  END OF SESSION:  PT End of Session - 01/05/23 1926     Visit Number 8    Number of Visits 17    Date for PT Re-Evaluation 02/18/23    Authorization Type [employer] medicare    PT Start Time 1305    PT Stop Time 1400    PT Time Calculation (min) 55 min    Equipment Utilized During Treatment Other (comment)   aquatic cuffs, barbells   Activity Tolerance Patient tolerated treatment well    Behavior During Therapy [facility] for tasks assessed/performed              Past Medical History:  Diagnosis Date   Allergy    Anxiety    CAD    a. acute inferior lateral wall infarction in September 2004 treated medically. b.  cath 06/17/16 showing mild nonobstructive CAD with 30-40% ostial LM (eccentric), elevated LV filling pressures and normal LV function.   Chronic diastolic CHF (congestive heart failure) (HCC)    CKD (chronic kidney disease), stage II    COPD (chronic obstructive pulmonary disease) (HCC)    Diverticulosis    DJD (degenerative joint disease)    GERD (gastroesophageal reflux disease)    HTN (hypertension)    Hyperlipidemia    Low back pain syndrome    Myocardial infarction (HCC) 2004   Obesity    OSA (obstructive sleep apnea)    RBBB    Recurrent aspiration bronchitis/pneumonia    Recurrent aspiration pneumonia (HCC)    Hattie Perch 07/13/2016   Stroke (HCC)    Thrombocytopenia (HCC)    Tubular adenoma of colon 2014   Past Surgical History:  Procedure Laterality Date   CARDIAC CATHETERIZATION     LEFT HEART CATH AND CORONARY ANGIOGRAPHY N/A 06/17/2016   Procedure: Left Heart Cath and Coronary Angiography;  Surgeon: Kathleene Hazel, MD;  Location: [facility] INVASIVE CV LAB;  Service: Cardiovascular;  Laterality: N/A;    UVULOPALATOPHARYNGOPLASTY     surgery for OSA   Patient Active Problem List   Diagnosis Date Noted   BPH (benign prostatic hyperplasia) 08/05/2022   Hearing problem 04/06/2022   COVID-19 12/10/2021   Cough 11/26/2021   Fever 11/26/2021   Wheezing 11/26/2021   Hamstring tendon rupture 06/24/2021   Hip pain, chronic, right 02/17/2021   Paresthesias 09/01/2020   Neck pain on left side 02/28/2020   Allergic rhinitis 12/26/2019   History of CVA (cerebrovascular accident) 12/05/2019   Right hemiparesis (HCC) 12/04/2019   Chronic renal insufficiency, stage 3 (moderate) (HCC) 07/05/2019   Mild cognitive disorder 10/12/2017   Viral pneumonitis 07/22/2016   COPD with asthma 07/22/2016   Depression with anxiety 07/13/2016   Acute respiratory failure with hypoxia (HCC) 07/13/2016   Chronic diastolic CHF (congestive heart failure) (HCC) 07/13/2016   Sepsis, unspecified organism (HCC) 07/13/2016   Acute pain of right knee 06/30/2016   Fatigue 06/30/2016   Creatinine elevation 06/30/2016   Dyspnea on exertion    Laryngopharyngeal reflux (LPR) 07/07/2015   COPD with acute exacerbation (HCC) 04/04/2015   Elevated IgE level 03/18/2014   Well adult exam 03/05/2014   COPD with chronic bronchitis 12/13/2013   Venous insufficiency 11/03/2011   Edema 11/03/2011   Memory disturbance 05/13/2011   CAD (coronary artery disease) 09/17/2010   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  Incidental pulmonary nodule 06/17/2009   COLONIC POLYPS 10/17/2007   BRONCHITIS, RECURRENT 10/17/2007   Diverticulosis of large intestine 10/17/2007   DEGENERATIVE JOINT DISEASE 10/17/2007   Hyperglycemia 10/17/2007   Dyslipidemia 10/02/2007   Anxiety disorder 10/02/2007   Obstructive sleep apnea 10/02/2007   Essential hypertension 10/02/2007   LOW BACK PAIN SYNDROME 10/02/2007    ONSET DATE: 11/17/2022  REFERRING DIAG: M54.40,G89.29 (ICD-10-CM) - Chronic midline low back pain with sciatica, sciatica laterality  unspecified G81.91 (ICD-10-CM) - Right hemiparesis (HCC)  THERAPY DIAG:  Unsteadiness on feet  Muscle weakness (generalized)  Other abnormalities of gait and mobility  Rationale for Evaluation and Treatment: Rehabilitation  SUBJECTIVE:                                                                                                                                                                                             SUBJECTIVE STATEMENT: Patient reports he has been working on walking more at home and also trying to take bigger steps; pt states he feels his balance is a little better but says he continues to have a lot of difficulty doing the exercises with his eyes closed Pt accompanied by: self  PERTINENT HISTORY: BPH, h/o CVA, anxiety, GERD, CHF, MI, COPD, HTN, HLD, CKD  PAIN:  Are you having pain? Yes: NPRS scale: 5-6/10 Pain location: R hip    There were no vitals filed for this visit.   PRECAUTIONS: Fall  PATIENT GOALS: "I need to get my balance and gait back"   OBJECTIVE:   DIAGNOSTIC FINDINGS:  Lumbar mri 11/2020:  IMPRESSION: 1. Lumbar spine spondylosis as described above. 2. No acute osseous injury of the lumbar spine.  R hip MRI IMPRESSION: 1. Complete tear of the hamstring component of the adductor magnus tendon origin from the ischial tuberosity with approximately 2.4 cm retraction with fluid-filled gap. 2. Tendinosis with low-grade partial tearing of the semimembranosus tendon as well as the conjoint tendon of the long head biceps femoris and semitendinosus without a full-thickness or retracted component.   TODAY'S TREATMENT: 01-05-23  Aquatic therapy at [facility] - pool temp 90 degrees  Patient seen for aquatic therapy today.  Treatment took place in water 3.6- 4.5 feet deep depending upon activity.  Pt entered & exited the pool via step negotiation with use of hand rails independently.  Pt performed runner's stretch RLE and LLE 30 sec hold x 1  rep each;  gastroc stretch with bil. Forefeet on pool wall 30 sec hold x 1 rep  Warm up - forwards 18' x 4 reps, backwards and sideways ambulation 18' x 4 reps each direction  Marching slowly in place with contralateral UE flexion 10 reps each leg; progressed to adding barbells in each hand for increased resistance 10 reps  Marching forward across pool with barbells 18' x 2 reps - 2 sec hold for improved SLS on stance leg; marching backwards 18' x 2 reps with bar bells in each hand for contralateral   Pt performed standing hip flexion with knee extended with bil. Horizontal shoulder adduction/abduction 10 reps each; hip abduction with shoulder abduction/adduction with bar bells 10 reps  Stepping strategy - forwards, back and sidestepping 5 reps each direction each leg with UE movement with barbells for improved core stabilization  Sidestepping with squats 18' x 4 reps across pool - with large (yellow barbells) for increased resistance for increased core stabilization  Core stabilization exercises; pt stood with feet together - held small bar bells; performed scapular protraction/retraction 10 reps; horizontal shoulder abduction/adduction with cues to keep core tight 10 reps; scapular protraction/retraction with small bar bells for increased resistance 10 reps  Trunk rotation with use of large yellow noodle for lateral trunk stretching - Rt and Lt sides - approx. 10 reps to each side  Pt performed hip flexion, abduction and extension with aquatic cuff for increased resistance with eccentric contraction 10 reps each leg each direction  Pt performed Ai Chi posture "balancing" with min tactile cues for correct sequence and with 1 UE support on pool edge for assist with balance  Pt requires buoyancy of water for support for reduced fall risk and for unloading/reduced stress on joints and spine as pt able to tolerate increased standing and ambulation in water compared to tolerance on land.  Viscosity  of water is needed for resistance for strengthening and current of water provides perturbations for challenge for balance training: buoyancy of water needed for spinal decompression for reduced pain with weight bearing activities & exercises in water.                                                                                                                  GOALS: Goals reviewed with patient? Yes  SHORT TERM GOALS: Target date: 01/07/23  Pt will be independent with initial HEP for improved balance and gait mechanics  Baseline: to be provided Goal status: Goal met   2.  Pt will improve FGA to >/= 23/30 to demonstrate improved balance and reduced fall risk  Baseline: 18/30;  01-04-23 - score 22/30 Goal status:  Partially met 01-04-23  3.  Pt will improve gait speed to >/= 1.45m/s to demonstrate improved community ambulation  Baseline: 1.71m/s;  9.37 secs = 1.07 m/sec ;  2nd trial  7.41 secs = 1.35 m/sec Goal status: Goal met 01-04-23  4.  Pt will improve 5x STS to </= 10 sec to demo improved functional LE strength and balance   Baseline: 13.19s; 01-04-23:   12.78 secs without UE support Goal status:  Partially met 01-04-23  5.  Patient will complete >/= 30s on condition 4 of MCTSIB to demonstrate improved balance  Baseline:  23s:  30 secs on 01-04-23 Goal status: Goal met   LONG TERM GOALS: Target date: 02/18/23  Pt will be independent with final HEP for improved balance and gait mechanics  Baseline: to be provided Goal status: INITIAL  Pt will improve FGA to >/= 23/30 to demonstrate improved balance and reduced fall risk  Baseline: 18/30 Goal status: INITIAL  Pt will improve gait speed to >/= 1.52m/s to demonstrate improved community ambulation  Baseline: 1.27m/s Goal status: INITIAL  Pt will improve 5x STS to </= 10 sec to demo improved functional LE strength and balance   Baseline: 13.19s Goal status: INITIAL  MCTSIB goal Baseline:  not needed Goal status:  INITIAL  ASSESSMENT:  CLINICAL IMPRESSION: Aquatic PT session focused on balance and gait training in 4' water depth without use of floatation device.  Pt improved with maintaining balance in today's aquatic session compared to performance in previous session last week.  Pt able to use large bar bells for increased resistance for Ue's, resulting in increased challenge for core stabilization exercises.  Continue POC.   OBJECTIVE IMPAIRMENTS: Abnormal gait, cardiopulmonary status limiting activity, decreased activity tolerance, decreased balance, decreased endurance, decreased knowledge of condition, difficulty walking, decreased strength, impaired flexibility, impaired sensation, postural dysfunction, and pain.   ACTIVITY LIMITATIONS: carrying, lifting, sitting, standing, stairs, locomotion level, and caring for others  PARTICIPATION LIMITATIONS: interpersonal relationship, driving, shopping, community activity, and yard work  PERSONAL FACTORS: Age, Fitness, Past/current experiences, Time since onset of injury/illness/exacerbation, and 3+ comorbidities: see above  are also affecting patient's functional outcome.   REHAB POTENTIAL: Good  CLINICAL DECISION MAKING: Stable/uncomplicated  EVALUATION COMPLEXITY: Low  PLAN:  PT FREQUENCY: 2x/week  PT DURATION: 8 weeks  PLANNED INTERVENTIONS: Therapeutic exercises, Therapeutic activity, Neuromuscular re-education, Balance training, Gait training, Patient/Family education, Self Care, Joint mobilization, Stair training, Vestibular training, Visual/preceptual remediation/compensation, Orthotic/Fit training, DME instructions, Aquatic Therapy, Dry Needling, Electrical stimulation, Cryotherapy, Moist heat, Manual therapy, and Re-evaluation  PLAN FOR NEXT SESSION: cont balance and gait training   Otha Monical, Donavan Burnet, PT  01/05/2023, 7:29 PM

## 2023-01-05 NOTE — Progress Notes (Signed)
   01/04/23 0001  Functional Gait  Assessment  Gait assessed  Yes  Gait Level Surface 3  Change in Gait Speed 3  Gait with Horizontal Head Turns 2  Gait with Vertical Head Turns 2  Gait and Pivot Turn 3  Step Over Obstacle 2  Gait with Narrow Base of Support 2  Gait with Eyes Closed 1  Ambulating Backwards 2  Steps 2  Total Score 22

## 2023-01-06 ENCOUNTER — Ambulatory Visit: Payer: Medicare Other

## 2023-01-10 ENCOUNTER — Ambulatory Visit: Payer: Self-pay | Admitting: Physical Therapy

## 2023-01-11 ENCOUNTER — Ambulatory Visit: Payer: Medicare Other | Admitting: Physical Therapy

## 2023-01-12 ENCOUNTER — Ambulatory Visit: Payer: Medicare Other | Admitting: Physical Therapy

## 2023-01-12 ENCOUNTER — Encounter: Payer: Self-pay | Admitting: Physical Therapy

## 2023-01-12 DIAGNOSIS — R2681 Unsteadiness on feet: Secondary | ICD-10-CM

## 2023-01-12 DIAGNOSIS — R2689 Other abnormalities of gait and mobility: Secondary | ICD-10-CM

## 2023-01-12 NOTE — Therapy (Signed)
OUTPATIENT PHYSICAL THERAPY NEURO TREATMENT/AQUATIC THERAPY   Patient Name: Larry Stone MRN: [id number] DOB:Apr 11, 1951, 72 y.o., male Today's Date: 01/12/2023   PCP: Tresa Garter, MD REFERRING PROVIDER: Tresa Garter, MD  END OF SESSION:  PT End of Session - 01/12/23 2055     Visit Number 9    Number of Visits 17    Date for PT Re-Evaluation 02/18/23    Authorization Type [employer] medicare    PT Start Time 1310    PT Stop Time 1400    PT Time Calculation (min) 50 min    Equipment Utilized During Treatment Other (comment)   aquatic cuffs, barbells, pool noodle   Activity Tolerance Patient tolerated treatment well    Behavior During Therapy [facility] for tasks assessed/performed              Past Medical History:  Diagnosis Date   Allergy    Anxiety    CAD    a. acute inferior lateral wall infarction in September 2004 treated medically. b.  cath 06/17/16 showing mild nonobstructive CAD with 30-40% ostial LM (eccentric), elevated LV filling pressures and normal LV function.   Chronic diastolic CHF (congestive heart failure) (HCC)    CKD (chronic kidney disease), stage II    COPD (chronic obstructive pulmonary disease) (HCC)    Diverticulosis    DJD (degenerative joint disease)    GERD (gastroesophageal reflux disease)    HTN (hypertension)    Hyperlipidemia    Low back pain syndrome    Myocardial infarction (HCC) 2004   Obesity    OSA (obstructive sleep apnea)    RBBB    Recurrent aspiration bronchitis/pneumonia    Recurrent aspiration pneumonia (HCC)    Hattie Perch 07/13/2016   Stroke (HCC)    Thrombocytopenia (HCC)    Tubular adenoma of colon 2014   Past Surgical History:  Procedure Laterality Date   CARDIAC CATHETERIZATION     LEFT HEART CATH AND CORONARY ANGIOGRAPHY N/A 06/17/2016   Procedure: Left Heart Cath and Coronary Angiography;  Surgeon: Kathleene Hazel, MD;  Location: [facility] INVASIVE CV LAB;  Service: Cardiovascular;  Laterality: N/A;    UVULOPALATOPHARYNGOPLASTY     surgery for OSA   Patient Active Problem List   Diagnosis Date Noted   BPH (benign prostatic hyperplasia) 08/05/2022   Hearing problem 04/06/2022   COVID-19 12/10/2021   Cough 11/26/2021   Fever 11/26/2021   Wheezing 11/26/2021   Hamstring tendon rupture 06/24/2021   Hip pain, chronic, right 02/17/2021   Paresthesias 09/01/2020   Neck pain on left side 02/28/2020   Allergic rhinitis 12/26/2019   History of CVA (cerebrovascular accident) 12/05/2019   Right hemiparesis (HCC) 12/04/2019   Chronic renal insufficiency, stage 3 (moderate) (HCC) 07/05/2019   Mild cognitive disorder 10/12/2017   Viral pneumonitis 07/22/2016   COPD with asthma 07/22/2016   Depression with anxiety 07/13/2016   Acute respiratory failure with hypoxia (HCC) 07/13/2016   Chronic diastolic CHF (congestive heart failure) (HCC) 07/13/2016   Sepsis, unspecified organism (HCC) 07/13/2016   Acute pain of right knee 06/30/2016   Fatigue 06/30/2016   Creatinine elevation 06/30/2016   Dyspnea on exertion    Laryngopharyngeal reflux (LPR) 07/07/2015   COPD with acute exacerbation (HCC) 04/04/2015   Elevated IgE level 03/18/2014   Well adult exam 03/05/2014   COPD with chronic bronchitis 12/13/2013   Venous insufficiency 11/03/2011   Edema 11/03/2011   Memory disturbance 05/13/2011   CAD (coronary artery disease) 09/17/2010   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/18/2010   Incidental pulmonary nodule 06/17/2009   COLONIC POLYPS 10/17/2007   BRONCHITIS, RECURRENT 10/17/2007   Diverticulosis of large intestine 10/17/2007   DEGENERATIVE JOINT DISEASE 10/17/2007   Hyperglycemia 10/17/2007   Dyslipidemia 10/02/2007   Anxiety disorder 10/02/2007   Obstructive sleep apnea 10/02/2007   Essential hypertension 10/02/2007   LOW BACK PAIN SYNDROME 10/02/2007    ONSET DATE: 11/17/2022  REFERRING DIAG: M54.40,G89.29 (ICD-10-CM) - Chronic midline low back pain with sciatica, sciatica laterality  unspecified G81.91 (ICD-10-CM) - Right hemiparesis (HCC)  THERAPY DIAG:  Unsteadiness on feet  Other abnormalities of gait and mobility  Rationale for Evaluation and Treatment: Rehabilitation  SUBJECTIVE:                                                                                                                                                                                             SUBJECTIVE STATEMENT: Patient reports he feels his balance is getting much better; has been going to the Yoga class on Mondays at [facility]. Cancelled yesterday's PT land appt due to having vertigo when he woke up - did not want to drive. Pt reports no vertigo today. Pt accompanied by: self  PERTINENT HISTORY: BPH, h/o CVA, anxiety, GERD, CHF, MI, COPD, HTN, HLD, CKD  PAIN:  Are you having pain? Yes: NPRS scale: 5-6/10 Pain location: R hip     PRECAUTIONS: Fall  PATIENT GOALS: "I need to get my balance and gait back"   OBJECTIVE:   DIAGNOSTIC FINDINGS:  Lumbar mri 11/2020:  IMPRESSION: 1. Lumbar spine spondylosis as described above. 2. No acute osseous injury of the lumbar spine.  R hip MRI IMPRESSION: 1. Complete tear of the hamstring component of the adductor magnus tendon origin from the ischial tuberosity with approximately 2.4 cm retraction with fluid-filled gap. 2. Tendinosis with low-grade partial tearing of the semimembranosus tendon as well as the conjoint tendon of the long head biceps femoris and semitendinosus without a full-thickness or retracted component.   TODAY'S TREATMENT: 01-12-23  Aquatic therapy at [facility] - pool temp 90 degrees  Patient seen for aquatic therapy today.  Treatment took place in water 3.6- 4.5 feet deep depending upon activity.  Pt entered & exited the pool via step negotiation with use of hand rails independently.  Pt performed runner's stretch RLE and LLE 30 sec hold x 1 rep each;  gastroc stretch with bil. Forefeet on pool wall 30 sec hold  x 1 rep  Warm up - forwards 18' x 4 reps, backwards and sideways ambulation 18' x 4 reps each direction  Marching slowly in place with contralateral UE flexion 10  reps each leg; progressed to adding barbells in each hand for increased resistance 10 reps  Marching forward across pool with barbells 18' x 4 reps - 2 sec hold for improved SLS on stance leg; marching backwards 18' x 2 reps with bar bells in each hand for contralateral   Pt performed standing hip flexion with knee extended with bil. Horizontal shoulder adduction/abduction 10 reps each; hip abduction with shoulder abduction/adduction with large bar bells 10 reps  Stepping strategy - forwards, back and sidestepping 10 reps each direction each leg with UE movement with barbells for improved core stabilization  Sidestepping with squats 18' x 6 reps across pool - with large (yellow barbells) for increased resistance for increased core stabilization  Core stabilization exercises; pt stood with feet together - held small bar bells; performed scapular protraction/retraction 10 reps; horizontal shoulder abduction/adduction with cues to keep core tight 10 reps; scapular protraction/retraction with small bar bells for increased resistance 10 reps  Trunk rotation with use of large yellow noodle for lateral trunk stretching - Rt and Lt sides - approx. 10 reps to each side  Pt performed hip flexion, abduction and extension with aquatic cuff for increased resistance with eccentric contraction 10 reps each leg each direction  Pt performed Ai Chi posture "balancing" with min tactile cues for correct sequence and with 1 UE support on pool edge for assist with balance  Pt requires buoyancy of water for support for reduced fall risk and for unloading/reduced stress on joints and spine as pt able to tolerate increased standing and ambulation in water compared to tolerance on land.  Viscosity of water is needed for resistance for strengthening and current  of water provides perturbations for challenge for balance training: buoyancy of water needed for spinal decompression for reduced pain with weight bearing activities & exercises in water.                                                                                                                  GOALS: Goals reviewed with patient? Yes  SHORT TERM GOALS: Target date: 01/07/23  Pt will be independent with initial HEP for improved balance and gait mechanics  Baseline: to be provided Goal status: Goal met   2.  Pt will improve FGA to >/= 23/30 to demonstrate improved balance and reduced fall risk  Baseline: 18/30;  01-04-23 - score 22/30 Goal status:  Partially met 01-04-23  3.  Pt will improve gait speed to >/= 1.65m/s to demonstrate improved community ambulation  Baseline: 1.55m/s;  9.37 secs = 1.07 m/sec ;  2nd trial  7.41 secs = 1.35 m/sec Goal status: Goal met 01-04-23  4.  Pt will improve 5x STS to </= 10 sec to demo improved functional LE strength and balance   Baseline: 13.19s; 01-04-23:   12.78 secs without UE support Goal status:  Partially met 01-04-23  5.  Patient will complete >/= 30s on condition 4 of MCTSIB to demonstrate improved balance Baseline:  23s:  30 secs on 01-04-23  Goal status: Goal met   LONG TERM GOALS: Target date: 02/18/23  Pt will be independent with final HEP for improved balance and gait mechanics  Baseline: to be provided Goal status: INITIAL  Pt will improve FGA to >/= 23/30 to demonstrate improved balance and reduced fall risk  Baseline: 18/30 Goal status: INITIAL  Pt will improve gait speed to >/= 1.56m/s to demonstrate improved community ambulation  Baseline: 1.88m/s Goal status: INITIAL  Pt will improve 5x STS to </= 10 sec to demo improved functional LE strength and balance   Baseline: 13.19s Goal status: INITIAL  MCTSIB goal Baseline:  not needed Goal status: INITIAL  ASSESSMENT:  CLINICAL IMPRESSION: Aquatic PT session  focused on balance and gait training in 4' water depth without use of floatation device.  Pt demonstrating increased step length with water walking and improved balance with SLS on each leg with intermittent UE support on pool edge.  Pt also demonstrates improved core stabilization with no LOB with UE movement with use of large barbells for increased resistance and perturbations in 4' water depth.   Pt reports feeling that aquatic exercise is very beneficial for him - feels he gets a total body workout.  Continue POC.   OBJECTIVE IMPAIRMENTS: Abnormal gait, cardiopulmonary status limiting activity, decreased activity tolerance, decreased balance, decreased endurance, decreased knowledge of condition, difficulty walking, decreased strength, impaired flexibility, impaired sensation, postural dysfunction, and pain.   ACTIVITY LIMITATIONS: carrying, lifting, sitting, standing, stairs, locomotion level, and caring for others  PARTICIPATION LIMITATIONS: interpersonal relationship, driving, shopping, community activity, and yard work  PERSONAL FACTORS: Age, Fitness, Past/current experiences, Time since onset of injury/illness/exacerbation, and 3+ comorbidities: see above  are also affecting patient's functional outcome.   REHAB POTENTIAL: Good  CLINICAL DECISION MAKING: Stable/uncomplicated  EVALUATION COMPLEXITY: Low  PLAN:  PT FREQUENCY: 2x/week  PT DURATION: 8 weeks  PLANNED INTERVENTIONS: Therapeutic exercises, Therapeutic activity, Neuromuscular re-education, Balance training, Gait training, Patient/Family education, Self Care, Joint mobilization, Stair training, Vestibular training, Visual/preceptual remediation/compensation, Orthotic/Fit training, DME instructions, Aquatic Therapy, Dry Needling, Electrical stimulation, Cryotherapy, Moist heat, Manual therapy, and Re-evaluation  PLAN FOR NEXT SESSION: 10th visit progress note due next land session;  cont balance training - add increased  vestibular exercises as tolerated; dynamic gait training   Nailyn Dearinger, Donavan Burnet, PT  01/12/2023, 8:58 PM

## 2023-01-13 ENCOUNTER — Ambulatory Visit: Payer: Medicare Other

## 2023-01-13 ENCOUNTER — Ambulatory Visit: Payer: Medicare Other | Admitting: Physical Therapy

## 2023-01-17 ENCOUNTER — Ambulatory Visit: Payer: Self-pay | Admitting: Physical Therapy

## 2023-01-18 ENCOUNTER — Ambulatory Visit: Payer: Medicare Other | Attending: Internal Medicine

## 2023-01-18 DIAGNOSIS — R2681 Unsteadiness on feet: Secondary | ICD-10-CM | POA: Insufficient documentation

## 2023-01-18 DIAGNOSIS — R2689 Other abnormalities of gait and mobility: Secondary | ICD-10-CM | POA: Insufficient documentation

## 2023-01-18 DIAGNOSIS — R293 Abnormal posture: Secondary | ICD-10-CM | POA: Insufficient documentation

## 2023-01-18 DIAGNOSIS — M6281 Muscle weakness (generalized): Secondary | ICD-10-CM | POA: Insufficient documentation

## 2023-01-18 DIAGNOSIS — R262 Difficulty in walking, not elsewhere classified: Secondary | ICD-10-CM | POA: Diagnosis present

## 2023-01-18 NOTE — Therapy (Signed)
OUTPATIENT PHYSICAL THERAPY NEURO TREATMENT- 10TH VISIT PROGRESS NOTE   Patient Name: KANIN LIA MRN: [id number] DOB:1950-09-08, 72 y.o., male Today's Date: 01/18/2023  Physical Therapy Progress Note   Dates of Reporting Period:12/07/22- 01/18/23  See Note below for Objective Data and Assessment of Progress/Goals.  Thank you for the referral of this patient. Westley Foots, PT, DPT, CBIS    PCP: Tresa Garter, MD REFERRING PROVIDER: Tresa Garter, MD  END OF SESSION:  PT End of Session - 01/18/23 1053     Visit Number 10    Number of Visits 17    Date for PT Re-Evaluation 02/18/23    Authorization Type [employer] medicare    PT Start Time 1100    PT Stop Time 1142    PT Time Calculation (min) 42 min    Equipment Utilized During Treatment Gait belt    Activity Tolerance Patient tolerated treatment well    Behavior During Therapy WFL for tasks assessed/performed              Past Medical History:  Diagnosis Date   Allergy    Anxiety    CAD    a. acute inferior lateral wall infarction in September 2004 treated medically. b.  cath 06/17/16 showing mild nonobstructive CAD with 30-40% ostial LM (eccentric), elevated LV filling pressures and normal LV function.   Chronic diastolic CHF (congestive heart failure) (HCC)    CKD (chronic kidney disease), stage II    COPD (chronic obstructive pulmonary disease) (HCC)    Diverticulosis    DJD (degenerative joint disease)    GERD (gastroesophageal reflux disease)    HTN (hypertension)    Hyperlipidemia    Low back pain syndrome    Myocardial infarction (HCC) 2004   Obesity    OSA (obstructive sleep apnea)    RBBB    Recurrent aspiration bronchitis/pneumonia    Recurrent aspiration pneumonia (HCC)    Hattie Perch 07/13/2016   Stroke (HCC)    Thrombocytopenia (HCC)    Tubular adenoma of colon 2014   Past Surgical History:  Procedure Laterality Date   CARDIAC CATHETERIZATION     LEFT HEART CATH AND CORONARY  ANGIOGRAPHY N/A 06/17/2016   Procedure: Left Heart Cath and Coronary Angiography;  Surgeon: Kathleene Hazel, MD;  Location: [facility] INVASIVE CV LAB;  Service: Cardiovascular;  Laterality: N/A;   UVULOPALATOPHARYNGOPLASTY     surgery for OSA   Patient Active Problem List   Diagnosis Date Noted   BPH (benign prostatic hyperplasia) 08/05/2022   Hearing problem 04/06/2022   COVID-19 12/10/2021   Cough 11/26/2021   Fever 11/26/2021   Wheezing 11/26/2021   Hamstring tendon rupture 06/24/2021   Hip pain, chronic, right 02/17/2021   Paresthesias 09/01/2020   Neck pain on left side 02/28/2020   Allergic rhinitis 12/26/2019   History of CVA (cerebrovascular accident) 12/05/2019   Right hemiparesis (HCC) 12/04/2019   Chronic renal insufficiency, stage 3 (moderate) (HCC) 07/05/2019   Mild cognitive disorder 10/12/2017   Viral pneumonitis 07/22/2016   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/2016   Depression with anxiety 07/13/2016   Acute respiratory failure with hypoxia (HCC) 07/13/2016   Chronic diastolic CHF (congestive heart failure) (HCC) 07/13/2016   Sepsis, unspecified organism (HCC) 07/13/2016   Acute pain of right knee 06/30/2016   Fatigue 06/30/2016   Creatinine elevation 06/30/2016   Dyspnea on exertion    Laryngopharyngeal reflux (LPR) 07/07/2015   COPD with acute exacerbation (HCC) 04/04/2015   Elevated IgE level 03/18/2014  Well adult exam 03/05/2014   COPD with chronic bronchitis (HCC) 12/13/2013   Venous insufficiency 11/03/2011   Edema 11/03/2011   Memory disturbance 05/13/2011   CAD (coronary artery disease) 09/17/2010   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   Incidental pulmonary nodule 06/17/2009   COLONIC POLYPS 10/17/2007   BRONCHITIS, RECURRENT 10/17/2007   Diverticulosis of large intestine 10/17/2007   DEGENERATIVE JOINT DISEASE 10/17/2007   Hyperglycemia 10/17/2007   Dyslipidemia 10/02/2007   Anxiety disorder 10/02/2007   Obstructive sleep apnea 10/02/2007    Essential hypertension 10/02/2007   LOW BACK PAIN SYNDROME 10/02/2007    ONSET DATE: 11/17/2022  REFERRING DIAG: M54.40,G89.29 (ICD-10-CM) - Chronic midline low back pain with sciatica, sciatica laterality unspecified G81.91 (ICD-10-CM) - Right hemiparesis (HCC)  THERAPY DIAG:  Unsteadiness on feet  Other abnormalities of gait and mobility  Muscle weakness (generalized)  Abnormal posture  Difficulty in walking, not elsewhere classified  Rationale for Evaluation and Treatment: Rehabilitation  SUBJECTIVE:                                                                                                                                                                                             SUBJECTIVE STATEMENT: Patient reports doing well. No new episodes of Vertigo. Feels as though aquatic therapy has been beneficial. Denies falls.  Pt accompanied by: self  PERTINENT HISTORY: BPH, h/o CVA, anxiety, GERD, CHF, MI, COPD, HTN, HLD, CKD  PAIN:  Are you having pain? Yes: NPRS scale: 5-6/10 Pain location: R hip     PRECAUTIONS: Fall  PATIENT GOALS: "I need to get my balance and gait back"   OBJECTIVE:   DIAGNOSTIC FINDINGS:  Lumbar mri 11/2020:  IMPRESSION: 1. Lumbar spine spondylosis as described above. 2. No acute osseous injury of the lumbar spine.  R hip MRI IMPRESSION: 1. Complete tear of the hamstring component of the adductor magnus tendon origin from the ischial tuberosity with approximately 2.4 cm retraction with fluid-filled gap. 2. Tendinosis with low-grade partial tearing of the semimembranosus tendon as well as the conjoint tendon of the long head biceps femoris and semitendinosus without a full-thickness or retracted component.   TODAY'S TREATMENT:   [facility] PT Assessment - 01/18/23 0001       Standardized Balance Assessment   Five times sit to stand comments  12.17    10 Meter Walk 1.59m/s      Functional Gait  Assessment   Gait assessed  Yes     Gait Level Surface Walks 20 ft in less than 5.5 sec, no assistive devices, good speed, no evidence for imbalance, normal gait pattern, deviates no more than 6 in  outside of the 12 in walkway width.    Change in Gait Speed Able to smoothly change walking speed without loss of balance or gait deviation. Deviate no more than 6 in outside of the 12 in walkway width.    Gait with Horizontal Head Turns Performs head turns smoothly with no change in gait. Deviates no more than 6 in outside 12 in walkway width    Gait with Vertical Head Turns Performs head turns with no change in gait. Deviates no more than 6 in outside 12 in walkway width.    Gait and Pivot Turn Pivot turns safely within 3 sec and stops quickly with no loss of balance.    Step Over Obstacle Is able to step over 2 stacked shoe boxes taped together (9 in total height) without changing gait speed. No evidence of imbalance.    Gait with Narrow Base of Support Ambulates 7-9 steps.    Gait with Eyes Closed Walks 20 ft, uses assistive device, slower speed, mild gait deviations, deviates 6-10 in outside 12 in walkway width. Ambulates 20 ft in less than 9 sec but greater than 7 sec.    Ambulating Backwards Walks 20 ft, uses assistive device, slower speed, mild gait deviations, deviates 6-10 in outside 12 in walkway width.    Steps Alternating feet, must use rail.    Total Score 26            -in // bars:  -rockerboard toe taps   -rockerboard A/P EO/EC  -rockerboard lateral EO/EC  -in // bars:  -lateral stepping on foam balance beam blaze pods random tapping  -tandem gait on foam balance beam blaze pods random tapping  -seated ankle AROM wobble board for improved proprioception  GOALS: Goals reviewed with patient? Yes  SHORT TERM GOALS: Target date: 01/07/23  Pt will be independent with initial HEP for improved balance and gait mechanics  Baseline: to be provided Goal status: MET  2.  Pt will improve FGA to >/= 23/30 to demonstrate  improved balance and reduced fall risk  Baseline: 18/30;  01-04-23 - score 22/30; 26/30 Goal status:  MET  3.  Pt will improve gait speed to >/= 1.80m/s to demonstrate improved community ambulation  Baseline: 1.34m/s;  9.37 secs = 1.07 m/sec ;  2nd trial  7.41 secs = 1.35 m/sec; 1.58m/s Goal status: Goal met 01-04-23  4.  Pt will improve 5x STS to </= 10 sec to demo improved functional LE strength and balance   Baseline: 13.19s; 01-04-23:   12.78 secs without UE support; 12.17s no UE Goal status:  Partially met 01-04-23  5.  Patient will complete >/= 30s on condition 4 of MCTSIB to demonstrate improved balance Baseline:  23s:  30 secs on 01-04-23 Goal status: Goal met   LONG TERM GOALS: Target date: 02/18/23  Pt will be independent with final HEP for improved balance and gait mechanics  Baseline: to be provided Goal status: INITIAL  Pt will improve FGA to >/= 23/30 to demonstrate improved balance and reduced fall risk  Baseline: 18/30 Goal status: INITIAL  Pt will improve gait speed to >/= 1.45m/s to demonstrate improved community ambulation  Baseline: 1.14m/s Goal status: INITIAL  Pt will improve 5x STS to </= 10 sec to demo improved functional LE strength and balance   Baseline: 13.19s Goal status: INITIAL  MCTSIB goal Baseline:  not needed Goal status: INITIAL  ASSESSMENT:  CLINICAL IMPRESSION: Patient seen for skilled PT session with emphasis on goal assessment and  balance retraining. Five times Sit to Stand Test (FTSS) Method: Use a straight back chair with a solid seat that is 17-18" high. Ask participant to sit on the chair with arms folded across their chest.   Instructions: "Stand up and sit down as quickly as possible 5 times, keeping your arms folded across your chest."   Measurement: Stop timing when the participant touches the chair in sitting the 5th time.  TIME: 12.17 sec  Cut off scores indicative of increased fall risk: >12 sec CVA, >16 sec PD, >13  sec vestibular (ANPTA Core Set of Outcome Measures for Adults with Neurologic Conditions, 2018). 10 Meter Walk Test: Patient instructed to walk 10 meters (32.8 ft) as quickly and as safely as possible at their normal speed x2 and at a fast speed x2. Time measured from 2 meter mark to 8 meter mark to accommodate ramp-up and ramp-down.  Normal speed: 1.40m/s Cut off scores: <0.4 m/s = household Ambulator, 0.4-0.8 m/s = limited community Ambulator, >0.8 m/s = community Ambulator, >1.2 m/s = crossing a street, <1.0 = increased fall risk MCID 0.05 m/s (small), 0.13 m/s (moderate), 0.06 m/s (significant)  (ANPTA Core Set of Outcome Measures for Adults with Neurologic Conditions, 2018). Patient scored a 26/30 on  Functional Gait Assessment.   <22/30 = predictive of falls, <20/30 = fall in 6 months, <18/30 = predictive of falls in PD MCID: 5 points stroke population, 4 points geriatric population (ANPTA Core Set of Outcome Measures for Adults with Neurologic Conditions, 2018). Patient improving with ankle strategy given various balance and vestibular challenges. Continue POC.    OBJECTIVE IMPAIRMENTS: Abnormal gait, cardiopulmonary status limiting activity, decreased activity tolerance, decreased balance, decreased endurance, decreased knowledge of condition, difficulty walking, decreased strength, impaired flexibility, impaired sensation, postural dysfunction, and pain.   ACTIVITY LIMITATIONS: carrying, lifting, sitting, standing, stairs, locomotion level, and caring for others  PARTICIPATION LIMITATIONS: interpersonal relationship, driving, shopping, community activity, and yard work  PERSONAL FACTORS: Age, Fitness, Past/current experiences, Time since onset of injury/illness/exacerbation, and 3+ comorbidities: see above  are also affecting patient's functional outcome.   REHAB POTENTIAL: Good  CLINICAL DECISION MAKING: Stable/uncomplicated  EVALUATION COMPLEXITY: Low  PLAN:  PT FREQUENCY:  2x/week  PT DURATION: 8 weeks  PLANNED INTERVENTIONS: Therapeutic exercises, Therapeutic activity, Neuromuscular re-education, Balance training, Gait training, Patient/Family education, Self Care, Joint mobilization, Stair training, Vestibular training, Visual/preceptual remediation/compensation, Orthotic/Fit training, DME instructions, Aquatic Therapy, Dry Needling, Electrical stimulation, Cryotherapy, Moist heat, Manual therapy, and Re-evaluation  PLAN FOR NEXT SESSION: 10th visit progress note due next land session;  cont balance training - add increased vestibular exercises as tolerated; dynamic gait training   Westley Foots, PT Westley Foots, PT, DPT, CBIS  01/18/2023, 12:03 PM

## 2023-01-19 ENCOUNTER — Ambulatory Visit: Payer: Medicare Other | Admitting: Physical Therapy

## 2023-01-19 DIAGNOSIS — R2681 Unsteadiness on feet: Secondary | ICD-10-CM

## 2023-01-19 DIAGNOSIS — R2689 Other abnormalities of gait and mobility: Secondary | ICD-10-CM

## 2023-01-20 ENCOUNTER — Encounter: Payer: Self-pay | Admitting: Physical Therapy

## 2023-01-20 ENCOUNTER — Ambulatory Visit: Payer: Medicare Other

## 2023-01-20 ENCOUNTER — Ambulatory Visit: Payer: Medicare Other | Admitting: Physical Therapy

## 2023-01-20 NOTE — Therapy (Signed)
OUTPATIENT PHYSICAL THERAPY NEURO TREATMENT/AQUATIC THERAPY   Patient Name: Larry Stone MRN: [id number] DOB:[birth year], 72 y.o., male Today's Date: 01/20/2023   PCP: Tresa Garter, MD REFERRING PROVIDER: Tresa Garter, MD  END OF SESSION:  PT End of Session - 01/20/23 2038     Visit Number 11    Number of Visits 17    Date for PT Re-Evaluation 02/18/23    Authorization Type [employer] medicare    PT Start Time 1405    PT Stop Time 1455    PT Time Calculation (min) 50 min    Equipment Utilized During Treatment Other (comment)   aquatic cuffs, bar bells   Activity Tolerance Patient tolerated treatment well    Behavior During Therapy [facility] for tasks assessed/performed              Past Medical History:  Diagnosis Date   Allergy    Anxiety    CAD    a. acute inferior lateral wall infarction in September 2004 treated medically. b.  cath 06/17/16 showing mild nonobstructive CAD with 30-40% ostial LM (eccentric), elevated LV filling pressures and normal LV function.   Chronic diastolic CHF (congestive heart failure) (HCC)    CKD (chronic kidney disease), stage II    COPD (chronic obstructive pulmonary disease) (HCC)    Diverticulosis    DJD (degenerative joint disease)    GERD (gastroesophageal reflux disease)    HTN (hypertension)    Hyperlipidemia    Low back pain syndrome    Myocardial infarction (HCC) 2004   Obesity    OSA (obstructive sleep apnea)    RBBB    Recurrent aspiration bronchitis/pneumonia    Recurrent aspiration pneumonia (HCC)    Hattie Perch 07/13/2016   Stroke (HCC)    Thrombocytopenia (HCC)    Tubular adenoma of colon 2014   Past Surgical History:  Procedure Laterality Date   CARDIAC CATHETERIZATION     LEFT HEART CATH AND CORONARY ANGIOGRAPHY N/A 06/17/2016   Procedure: Left Heart Cath and Coronary Angiography;  Surgeon: Kathleene Hazel, MD;  Location: [facility] INVASIVE CV LAB;  Service: Cardiovascular;  Laterality: N/A;    UVULOPALATOPHARYNGOPLASTY     surgery for OSA   Patient Active Problem List   Diagnosis Date Noted   BPH (benign prostatic hyperplasia) 08/05/2022   Hearing problem 04/06/2022   COVID-19 12/10/2021   Cough 11/26/2021   Fever 11/26/2021   Wheezing 11/26/2021   Hamstring tendon rupture 06/24/2021   Hip pain, chronic, right 02/17/2021   Paresthesias 09/01/2020   Neck pain on left side 02/28/2020   Allergic rhinitis 12/26/2019   History of CVA (cerebrovascular accident) 12/05/2019   Right hemiparesis (HCC) 12/04/2019   Chronic renal insufficiency, stage 3 (moderate) (HCC) 07/05/2019   Mild cognitive disorder 10/12/2017   Viral pneumonitis 07/22/2016   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/2016   Depression with anxiety 07/13/2016   Acute respiratory failure with hypoxia (HCC) 07/13/2016   Chronic diastolic CHF (congestive heart failure) (HCC) 07/13/2016   Sepsis, unspecified organism (HCC) 07/13/2016   Acute pain of right knee 06/30/2016   Fatigue 06/30/2016   Creatinine elevation 06/30/2016   Dyspnea on exertion    Laryngopharyngeal reflux (LPR) 07/07/2015   COPD with acute exacerbation (HCC) 04/04/2015   Elevated IgE level 03/18/2014   Well adult exam 03/05/2014   COPD with chronic bronchitis (HCC) 12/13/2013   Venous insufficiency 11/03/2011   Edema 11/03/2011   Memory disturbance 05/13/2011   CAD (coronary artery disease) 09/17/2010  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   Incidental pulmonary nodule 06/17/2009   COLONIC POLYPS 10/17/2007   BRONCHITIS, RECURRENT 10/17/2007   Diverticulosis of large intestine 10/17/2007   Osteoarthritis 10/17/2007   Hyperglycemia 10/17/2007   Dyslipidemia 10/02/2007   Anxiety disorder 10/02/2007   Obstructive sleep apnea 10/02/2007   Essential hypertension 10/02/2007   LOW BACK PAIN SYNDROME 10/02/2007    ONSET DATE: 11/17/2022  REFERRING DIAG: M54.40,G89.29 (ICD-10-CM) - Chronic midline low back pain with sciatica, sciatica laterality  unspecified G81.91 (ICD-10-CM) - Right hemiparesis (HCC)  THERAPY DIAG:  Unsteadiness on feet  Other abnormalities of gait and mobility  Rationale for Evaluation and Treatment: Rehabilitation  SUBJECTIVE:                                                                                                                                                                                             SUBJECTIVE STATEMENT: Patient reports he feels his balance is getting much better; has been going to the Yoga class on Mondays at [facility]. Cancelled yesterday's PT land appt due to having vertigo when he woke up - did not want to drive. Pt reports no vertigo today. Pt accompanied by: self  PERTINENT HISTORY: BPH, h/o CVA, anxiety, GERD, CHF, MI, COPD, HTN, HLD, CKD  PAIN:  Are you having pain? Yes: NPRS scale: 5-6/10 Pain location: R hip     PRECAUTIONS: Fall  PATIENT GOALS: "I need to get my balance and gait back"   OBJECTIVE:   DIAGNOSTIC FINDINGS:  Lumbar mri 11/2020:  IMPRESSION: 1. Lumbar spine spondylosis as described above. 2. No acute osseous injury of the lumbar spine.  R hip MRI IMPRESSION: 1. Complete tear of the hamstring component of the adductor magnus tendon origin from the ischial tuberosity with approximately 2.4 cm retraction with fluid-filled gap. 2. Tendinosis with low-grade partial tearing of the semimembranosus tendon as well as the conjoint tendon of the long head biceps femoris and semitendinosus without a full-thickness or retracted component.   TODAY'S TREATMENT: 01-19-23  Aquatic therapy at [facility] - pool temp 90 degrees  Patient seen for aquatic therapy today.  Treatment took place in water 3.6- 4.5 feet deep depending upon activity.  Pt entered & exited the pool via step negotiation with use of hand rails independently.  Pt arrived approx. 15" early prior to scheduled appt. Time - performed water walking forwards and sideways independently prior  to scheduled PT session  Pt performed runner's stretch RLE and LLE 30 sec hold x 1 rep each;  gastroc stretch with bil. Forefeet on pool wall 30 sec hold x 1 rep  Warm up - forwards 18'  x 4 reps, backwards and sideways ambulation 18' x 4 reps each direction  Marching slowly in place with contralateral UE flexion 10 reps each leg; progressed to adding barbells in each hand for increased resistance 10 reps  Marching forward across pool with barbells 18' x 4 reps - 2 sec hold for improved SLS on stance leg; marching backwards 18' x 2 reps with bar bells in each hand for contralateral UE/LE movement  Stepping strategy - forwards, back and sidestepping 10 reps each direction each leg with UE movement with barbells for improved core stabilization  Sidestepping with squats 18' x 4 reps across pool - with small barbells for increased resistance for increased core stabilization  Core stabilization exercises; pt stood with feet together - held small bar bells; performed scapular protraction/retraction 10 reps; horizontal shoulder abduction/adduction with cues to keep core tight 10 reps; scapular protraction/retraction with small bar bells for increased resistance 10 reps  Pt performed hip flexion, abduction and extension with aquatic cuff for increased resistance with eccentric contraction 10 reps each leg each direction  Pt requires buoyancy of water for support for reduced fall risk and for unloading/reduced stress on joints and spine as pt able to tolerate increased standing and ambulation in water compared to tolerance on land.  Viscosity of water is needed for resistance for strengthening and current of water provides perturbations for challenge for balance training: buoyancy of water needed for spinal decompression for reduced pain with weight bearing activities & exercises in water.                                                                                                                   GOALS: Goals reviewed with patient? Yes  SHORT TERM GOALS: Target date: 01/07/23  Pt will be independent with initial HEP for improved balance and gait mechanics  Baseline: to be provided Goal status: MET  2.  Pt will improve FGA to >/= 23/30 to demonstrate improved balance and reduced fall risk  Baseline: 18/30;  01-04-23 - score 22/30; 26/30 Goal status:  MET  3.  Pt will improve gait speed to >/= 1.1m/s to demonstrate improved community ambulation  Baseline: 1.60m/s;  9.37 secs = 1.07 m/sec ;  2nd trial  7.41 secs = 1.35 m/sec; 1.41m/s Goal status: Goal met 01-04-23  4.  Pt will improve 5x STS to </= 10 sec to demo improved functional LE strength and balance   Baseline: 13.19s; 01-04-23:   12.78 secs without UE support; 12.17s no UE Goal status:  Partially met 01-04-23  5.  Patient will complete >/= 30s on condition 4 of MCTSIB to demonstrate improved balance Baseline:  23s:  30 secs on 01-04-23 Goal status: Goal met    LONG TERM GOALS: Target date: 02/18/23  Pt will be independent with final HEP for improved balance and gait mechanics  Baseline: to be provided Goal status: INITIAL  Pt will improve FGA to >/= 23/30 to demonstrate improved balance and reduced fall risk  Baseline: 18/30 Goal  status: INITIAL  Pt will improve gait speed to >/= 1.22m/s to demonstrate improved community ambulation  Baseline: 1.35m/s Goal status: INITIAL  Pt will improve 5x STS to </= 10 sec to demo improved functional LE strength and balance   Baseline: 13.19s Goal status: INITIAL  MCTSIB goal Baseline:  not needed Goal status: INITIAL  ASSESSMENT:  CLINICAL IMPRESSION: Aquatic PT session focused on balance and gait training in 4' - 4.5' water depth without use of floatation device for assist with balance.  Pt demonstrates much improvement in balance and gait with aquatic exercises and also with coordination of contralateral UE and LE movements with exercises for  improved core stabilization.  Continue POC.   OBJECTIVE IMPAIRMENTS: Abnormal gait, cardiopulmonary status limiting activity, decreased activity tolerance, decreased balance, decreased endurance, decreased knowledge of condition, difficulty walking, decreased strength, impaired flexibility, impaired sensation, postural dysfunction, and pain.   ACTIVITY LIMITATIONS: carrying, lifting, sitting, standing, stairs, locomotion level, and caring for others  PARTICIPATION LIMITATIONS: interpersonal relationship, driving, shopping, community activity, and yard work  PERSONAL FACTORS: Age, Fitness, Past/current experiences, Time since onset of injury/illness/exacerbation, and 3+ comorbidities: see above  are also affecting patient's functional outcome.   REHAB POTENTIAL: Good  CLINICAL DECISION MAKING: Stable/uncomplicated  EVALUATION COMPLEXITY: Low  PLAN:  PT FREQUENCY: 2x/week  PT DURATION: 8 weeks  PLANNED INTERVENTIONS: Therapeutic exercises, Therapeutic activity, Neuromuscular re-education, Balance training, Gait training, Patient/Family education, Self Care, Joint mobilization, Stair training, Vestibular training, Visual/preceptual remediation/compensation, Orthotic/Fit training, DME instructions, Aquatic Therapy, Dry Needling, Electrical stimulation, Cryotherapy, Moist heat, Manual therapy, and Re-evaluation  PLAN FOR NEXT SESSION: cont balance training - add increased vestibular exercises as tolerated; dynamic gait training   Jasraj Lappe, Donavan Burnet, PT  01/20/2023, 8:41 PM

## 2023-01-23 ENCOUNTER — Other Ambulatory Visit: Payer: Self-pay | Admitting: Internal Medicine

## 2023-01-23 ENCOUNTER — Other Ambulatory Visit: Payer: Self-pay | Admitting: Cardiovascular Disease

## 2023-01-24 ENCOUNTER — Ambulatory Visit: Payer: Self-pay | Admitting: Physical Therapy

## 2023-01-25 ENCOUNTER — Ambulatory Visit: Payer: Medicare Other

## 2023-01-25 DIAGNOSIS — R262 Difficulty in walking, not elsewhere classified: Secondary | ICD-10-CM

## 2023-01-25 DIAGNOSIS — R2681 Unsteadiness on feet: Secondary | ICD-10-CM | POA: Diagnosis not present

## 2023-01-25 DIAGNOSIS — R2689 Other abnormalities of gait and mobility: Secondary | ICD-10-CM

## 2023-01-25 DIAGNOSIS — M6281 Muscle weakness (generalized): Secondary | ICD-10-CM

## 2023-01-25 DIAGNOSIS — R293 Abnormal posture: Secondary | ICD-10-CM

## 2023-01-25 NOTE — Therapy (Signed)
OUTPATIENT PHYSICAL THERAPY NEURO TREATMENT   Patient Name: Larry Stone MRN: [id number] DOB:[birth year], 72 y.o., male Today's Date: 01/25/2023   PCP: Tresa Garter, MD REFERRING PROVIDER: Tresa Garter, MD  END OF SESSION:  PT End of Session - 01/25/23 1054     Visit Number 12    Number of Visits 17    Date for PT Re-Evaluation 02/18/23    Authorization Type [employer] medicare    PT Start Time 1058    PT Stop Time 1140    PT Time Calculation (min) 42 min    Equipment Utilized During Treatment Gait belt    Activity Tolerance Patient tolerated treatment well    Behavior During Therapy WFL for tasks assessed/performed              Past Medical History:  Diagnosis Date   Allergy    Anxiety    CAD    a. acute inferior lateral wall infarction in September 2004 treated medically. b.  cath 06/17/16 showing mild nonobstructive CAD with 30-40% ostial LM (eccentric), elevated LV filling pressures and normal LV function.   Chronic diastolic CHF (congestive heart failure) (HCC)    CKD (chronic kidney disease), stage II    COPD (chronic obstructive pulmonary disease) (HCC)    Diverticulosis    DJD (degenerative joint disease)    GERD (gastroesophageal reflux disease)    HTN (hypertension)    Hyperlipidemia    Low back pain syndrome    Myocardial infarction (HCC) 2004   Obesity    OSA (obstructive sleep apnea)    RBBB    Recurrent aspiration bronchitis/pneumonia    Recurrent aspiration pneumonia (HCC)    Hattie Perch 07/13/2016   Stroke (HCC)    Thrombocytopenia (HCC)    Tubular adenoma of colon 2014   Past Surgical History:  Procedure Laterality Date   CARDIAC CATHETERIZATION     LEFT HEART CATH AND CORONARY ANGIOGRAPHY N/A 06/17/2016   Procedure: Left Heart Cath and Coronary Angiography;  Surgeon: Kathleene Hazel, MD;  Location: [facility] INVASIVE CV LAB;  Service: Cardiovascular;  Laterality: N/A;   UVULOPALATOPHARYNGOPLASTY     surgery for OSA   Patient Active  Problem List   Diagnosis Date Noted   BPH (benign prostatic hyperplasia) 08/05/2022   Hearing problem 04/06/2022   COVID-19 12/10/2021   Cough 11/26/2021   Fever 11/26/2021   Wheezing 11/26/2021   Hamstring tendon rupture 06/24/2021   Hip pain, chronic, right 02/17/2021   Paresthesias 09/01/2020   Neck pain on left side 02/28/2020   Allergic rhinitis 12/26/2019   History of CVA (cerebrovascular accident) 12/05/2019   Right hemiparesis (HCC) 12/04/2019   Chronic renal insufficiency, stage 3 (moderate) (HCC) 07/05/2019   Mild cognitive disorder 10/12/2017   Viral pneumonitis 07/22/2016   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/2016   Depression with anxiety 07/13/2016   Acute respiratory failure with hypoxia (HCC) 07/13/2016   Chronic diastolic CHF (congestive heart failure) (HCC) 07/13/2016   Sepsis, unspecified organism (HCC) 07/13/2016   Acute pain of right knee 06/30/2016   Fatigue 06/30/2016   Creatinine elevation 06/30/2016   Dyspnea on exertion    Laryngopharyngeal reflux (LPR) 07/07/2015   COPD with acute exacerbation (HCC) 04/04/2015   Elevated IgE level 03/18/2014   Well adult exam 03/05/2014   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29 (ICD-10-CM) - Chronic midline low back pain with sciatica, sciatica laterality unspecified G81.91 (ICD-10-CM) - Right hemiparesis (HCC)  THERAPY  DIAG:  Unsteadiness on feet  Other abnormalities of gait and mobility  Muscle weakness (generalized)  Abnormal posture  Difficulty in walking, not elsewhere classified  Rationale for Evaluation and Treatment: Rehabilitation  SUBJECTIVE:                                                                                                                                                                                             SUBJECTIVE STATEMENT: Patient reports doing well. Denies any additional episodes of vertigo- not dizzy today. Denies falls.  Pt accompanied by: self  PERTINENT HISTORY: BPH, h/o CVA, anxiety, GERD, CHF, MI, COPD, HTN, HLD, CKD  PAIN:  Are you having pain? Yes: NPRS scale: 7/10 Pain location: R hip     PRECAUTIONS: Fall  PATIENT GOALS: "I need to get my balance and gait back"   OBJECTIVE:   DIAGNOSTIC FINDINGS:  Lumbar mri 11/2020:  IMPRESSION: 1. Lumbar spine spondylosis as described above. 2. No acute osseous injury of the lumbar spine.  R hip MRI IMPRESSION: 1. Complete tear of the hamstring component of the adductor magnus tendon origin from the ischial tuberosity with approximately 2.4 cm retraction with fluid-filled gap. 2. Tendinosis with low-grade partial tearing of the semimembranosus tendon as well as the conjoint tendon of the long head biceps femoris and semitendinosus without a full-thickness or retracted component.   TODAY'S TREATMENT:  NMR: -nustep level 3 x10 mins B UE/LE for large amplitude reciprocal coordination -standing on rockerboard A/P 6# medball chest press-> shoulder press  - sit <> stand on rockerboard A/P 6# medball at chest -sitting on physioball trunk rotation -> contralateral leg lift with trunk twist -230' ball toss  -ambulating over grass: head turns, dual tasking (increased lateral deviation in path) -provided patient with facial strengthening exercises as he reports that his drooling is worse since MD adjusted  meds   GOALS: Goals reviewed with patient? Yes  SHORT TERM GOALS: Target date: 01/07/23  Pt will be independent with initial HEP for improved balance and gait mechanics  Baseline: to be provided Goal status: MET  2.  Pt will improve FGA to >/=  23/30 to demonstrate improved balance and reduced fall risk  Baseline: 18/30;  01-04-23 - score 22/30; 26/30 Goal status:  MET  3.  Pt will improve gait speed to >/= 1.23m/s to demonstrate improved community ambulation  Baseline: 1.74m/s;  9.37 secs = 1.07 m/sec ;  2nd trial  7.41 secs = 1.35 m/sec; 1.4m/s Goal status: Goal met 01-04-23  4.  Pt will improve 5x STS to </= 10 sec to demo improved functional LE strength and balance   Baseline: 13.19s; 01-04-23:   12.78 secs without UE support; 12.17s no UE Goal status:  Partially met 01-04-23  5.  Patient will complete >/= 30s on condition 4 of MCTSIB to demonstrate improved balance Baseline:  23s:  30 secs on 01-04-23 Goal status: Goal met    LONG TERM GOALS: Target date: 02/18/23  Pt will be independent with final HEP for improved balance and gait mechanics  Baseline: to be provided Goal status: INITIAL  Pt will improve FGA to >/= 23/30 to demonstrate improved balance and reduced fall risk  Baseline: 18/30 Goal status: INITIAL  Pt will improve gait speed to >/= 1.34m/s to demonstrate improved community ambulation  Baseline: 1.3m/s Goal status: INITIAL  Pt will improve 5x STS to </= 10 sec to demo improved functional LE strength and balance   Baseline: 13.19s Goal status: INITIAL  MCTSIB goal Baseline:  not needed Goal status: INITIAL  ASSESSMENT:  CLINICAL IMPRESSION: Patient seen for skilled PT session with emphasis on balance retraining. Progressing well with advancing vestibular challenges. Noted increased instability with dual tasking, especially with cog overlay, but remains functional. Continue POC.   OBJECTIVE IMPAIRMENTS: Abnormal gait, cardiopulmonary status  limiting activity, decreased activity tolerance, decreased balance, decreased endurance, decreased knowledge of condition, difficulty walking, decreased strength, impaired flexibility, impaired sensation, postural dysfunction, and pain.   ACTIVITY LIMITATIONS: carrying, lifting, sitting, standing, stairs, locomotion level, and caring for others  PARTICIPATION LIMITATIONS: interpersonal relationship, driving, shopping, community activity, and yard work  PERSONAL FACTORS: Age, Fitness, Past/current experiences, Time since onset of injury/illness/exacerbation, and 3+ comorbidities: see above  are also affecting patient's functional outcome.   REHAB POTENTIAL: Good  CLINICAL DECISION MAKING: Stable/uncomplicated  EVALUATION COMPLEXITY: Low  PLAN:  PT FREQUENCY: 2x/week  PT DURATION: 8 weeks  PLANNED INTERVENTIONS: Therapeutic exercises, Therapeutic activity, Neuromuscular re-education, Balance training, Gait training, Patient/Family education, Self Care, Joint mobilization, Stair training, Vestibular training, Visual/preceptual remediation/compensation, Orthotic/Fit training, DME instructions, Aquatic Therapy, Dry Needling, Electrical stimulation, Cryotherapy, Moist heat, Manual therapy, and Re-evaluation  PLAN FOR NEXT SESSION: cont balance training - add increased vestibular exercises as tolerated; dynamic gait training   Westley Foots, PT Westley Foots, PT, DPT, CBIS  01/25/2023, 11:43 AM

## 2023-01-26 ENCOUNTER — Ambulatory Visit: Payer: Self-pay | Admitting: Physical Therapy

## 2023-01-26 DIAGNOSIS — R2681 Unsteadiness on feet: Secondary | ICD-10-CM

## 2023-01-26 DIAGNOSIS — M6281 Muscle weakness (generalized): Secondary | ICD-10-CM

## 2023-01-26 DIAGNOSIS — R2689 Other abnormalities of gait and mobility: Secondary | ICD-10-CM

## 2023-01-27 ENCOUNTER — Encounter: Payer: Self-pay | Admitting: Physical Therapy

## 2023-01-27 ENCOUNTER — Ambulatory Visit: Payer: Medicare Other | Admitting: Physical Therapy

## 2023-01-27 ENCOUNTER — Ambulatory Visit: Payer: Medicare Other

## 2023-01-27 NOTE — Therapy (Signed)
OUTPATIENT PHYSICAL THERAPY NEURO TREATMENT/AQUATIC THERAPY   Patient Name: Larry Stone MRN: [id number] DOB:[birth year], 72 y.o., male Today's Date: 01/27/2023   PCP: Tresa Garter, MD REFERRING PROVIDER: Tresa Garter, MD  END OF SESSION:  PT End of Session - 01/27/23 0915     Visit Number 13    Number of Visits 17    Date for PT Re-Evaluation 02/18/23    Authorization Type [employer] medicare    PT Start Time 1310    PT Stop Time 1400    PT Time Calculation (min) 50 min    Equipment Utilized During Treatment Other (comment)   bar bells, aquatic cuffs   Activity Tolerance Patient tolerated treatment well    Behavior During Therapy WFL for tasks assessed/performed              Past Medical History:  Diagnosis Date   Allergy    Anxiety    CAD    a. acute inferior lateral wall infarction in September 2004 treated medically. b.  cath 06/17/16 showing mild nonobstructive CAD with 30-40% ostial LM (eccentric), elevated LV filling pressures and normal LV function.   Chronic diastolic CHF (congestive heart failure) (HCC)    CKD (chronic kidney disease), stage II    COPD (chronic obstructive pulmonary disease) (HCC)    Diverticulosis    DJD (degenerative joint disease)    GERD (gastroesophageal reflux disease)    HTN (hypertension)    Hyperlipidemia    Low back pain syndrome    Myocardial infarction (HCC) 2004   Obesity    OSA (obstructive sleep apnea)    RBBB    Recurrent aspiration bronchitis/pneumonia    Recurrent aspiration pneumonia (HCC)    Hattie Perch 07/13/2016   Stroke (HCC)    Thrombocytopenia (HCC)    Tubular adenoma of colon 2014   Past Surgical History:  Procedure Laterality Date   CARDIAC CATHETERIZATION     LEFT HEART CATH AND CORONARY ANGIOGRAPHY N/A 06/17/2016   Procedure: Left Heart Cath and Coronary Angiography;  Surgeon: Kathleene Hazel, MD;  Location: [facility] INVASIVE CV LAB;  Service: Cardiovascular;  Laterality: N/A;    UVULOPALATOPHARYNGOPLASTY     surgery for OSA   Patient Active Problem List   Diagnosis Date Noted   BPH (benign prostatic hyperplasia) 08/05/2022   Hearing problem 04/06/2022   COVID-19 12/10/2021   Cough 11/26/2021   Fever 11/26/2021   Wheezing 11/26/2021   Hamstring tendon rupture 06/24/2021   Hip pain, chronic, right 02/17/2021   Paresthesias 09/01/2020   Neck pain on left side 02/28/2020   Allergic rhinitis 12/26/2019   History of CVA (cerebrovascular accident) 12/05/2019   Right hemiparesis (HCC) 12/04/2019   Chronic renal insufficiency, stage 3 (moderate) (HCC) 07/05/2019   Mild cognitive disorder 10/12/2017   Viral pneumonitis 07/22/2016   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/2016   Depression with anxiety 07/13/2016   Acute respiratory failure with hypoxia (HCC) 07/13/2016   Chronic diastolic CHF (congestive heart failure) (HCC) 07/13/2016   Sepsis, unspecified organism (HCC) 07/13/2016   Acute pain of right knee 06/30/2016   Fatigue 06/30/2016   Creatinine elevation 06/30/2016   Dyspnea on exertion    Laryngopharyngeal reflux (LPR) 07/07/2015   COPD with acute exacerbation (HCC) 04/04/2015   Elevated IgE level 03/18/2014   Well adult exam 03/05/2014   COPD with chronic bronchitis (HCC) 12/13/2013   Venous insufficiency 11/03/2011   Edema 11/03/2011   Memory disturbance 05/13/2011   CAD (coronary artery disease) 09/17/2010  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   Incidental pulmonary nodule 06/17/2009   COLONIC POLYPS 10/17/2007   BRONCHITIS, RECURRENT 10/17/2007   Diverticulosis of large intestine 10/17/2007   Osteoarthritis 10/17/2007   Hyperglycemia 10/17/2007   Dyslipidemia 10/02/2007   Anxiety disorder 10/02/2007   Obstructive sleep apnea 10/02/2007   Essential hypertension 10/02/2007   LOW BACK PAIN SYNDROME 10/02/2007    ONSET DATE: 11/17/2022  REFERRING DIAG: M54.40,G89.29 (ICD-10-CM) - Chronic midline low back pain with sciatica, sciatica laterality  unspecified G81.91 (ICD-10-CM) - Right hemiparesis (HCC)  THERAPY DIAG:  Unsteadiness on feet  Other abnormalities of gait and mobility  Muscle weakness (generalized)  Rationale for Evaluation and Treatment: Rehabilitation  SUBJECTIVE:                                                                                                                                                                                             SUBJECTIVE STATEMENT: Patient reports doing much better - was surprised he could do some of the activities in his land PT appt yesterday.  Pt accompanied by: self  PERTINENT HISTORY: BPH, h/o CVA, anxiety, GERD, CHF, MI, COPD, HTN, HLD, CKD  PAIN:  Are you having pain? Yes: NPRS scale: 5-6/10 Pain location: R hip     PRECAUTIONS: Fall  PATIENT GOALS: "I need to get my balance and gait back"   OBJECTIVE:   DIAGNOSTIC FINDINGS:  Lumbar mri 11/2020:  IMPRESSION: 1. Lumbar spine spondylosis as described above. 2. No acute osseous injury of the lumbar spine.  R hip MRI IMPRESSION: 1. Complete tear of the hamstring component of the adductor magnus tendon origin from the ischial tuberosity with approximately 2.4 cm retraction with fluid-filled gap. 2. Tendinosis with low-grade partial tearing of the semimembranosus tendon as well as the conjoint tendon of the long head biceps femoris and semitendinosus without a full-thickness or retracted component.   TODAY'S TREATMENT: 01-26-23  Aquatic therapy at [facility] - pool temp 90 degrees  Patient seen for aquatic therapy today.  Treatment took place in water 3.6- 4.5 feet deep depending upon activity.  Pt entered & exited the pool via step negotiation with use of hand rails independently.  Pt performed runner's stretch RLE and LLE 30 sec hold x 1 rep each;  gastroc stretch with bil. Forefeet on pool wall 30 sec hold x 1 rep  Warm up - forwards 18' x 4 reps, backwards and sideways ambulation 18' x 4 reps each  direction  Marching slowly in place with contralateral UE flexion 10 reps each leg; progressed to adding barbells in each hand for increased resistance 10 reps  Marching forward across pool  with barbells 18' x 4 reps - 1-2 sec hold for improved SLS on stance leg; marching backwards 18' x 2 reps with bar bells in each hand for contralateral UE/LE movement  Stepping strategy - forwards, back and sidestepping 10 reps each direction each leg with UE movement with barbells for improved core stabilization  Sidestepping with squats 18' x 4 reps across pool - with small barbells for increased resistance for increased core stabilization  Pt performed hip flexion, abduction, adduction and extension RLE and LLE with aquatic cuff for increased resistance with eccentric contraction 10 reps each leg each direction  Ai Chi posture - Balancing with tactile cue for correct UE movement with LE movement with UE support on pool edge prn  Pt requires buoyancy of water for support for reduced fall risk and for unloading/reduced stress on joints and spine as pt able to tolerate increased standing and ambulation in water compared to tolerance on land.  Viscosity of water is needed for resistance for strengthening and current of water provides perturbations for challenge for balance training: buoyancy of water needed for spinal decompression for reduced pain with weight bearing activities & exercises in water.                                                                                                                  GOALS: Goals reviewed with patient? Yes  SHORT TERM GOALS: Target date: 01/07/23  Pt will be independent with initial HEP for improved balance and gait mechanics  Baseline: to be provided Goal status: MET  2.  Pt will improve FGA to >/= 23/30 to demonstrate improved balance and reduced fall risk  Baseline: 18/30;  01-04-23 - score 22/30; 26/30 Goal status:  MET  3.  Pt will improve gait  speed to >/= 1.3m/s to demonstrate improved community ambulation  Baseline: 1.25m/s;  9.37 secs = 1.07 m/sec ;  2nd trial  7.41 secs = 1.35 m/sec; 1.72m/s Goal status: Goal met 01-04-23  4.  Pt will improve 5x STS to </= 10 sec to demo improved functional LE strength and balance   Baseline: 13.19s; 01-04-23:   12.78 secs without UE support; 12.17s no UE Goal status:  Partially met 01-04-23  5.  Patient will complete >/= 30s on condition 4 of MCTSIB to demonstrate improved balance Baseline:  23s:  30 secs on 01-04-23 Goal status: Goal met    LONG TERM GOALS: Target date: 02/18/23  Pt will be independent with final HEP for improved balance and gait mechanics  Baseline: to be provided Goal status: INITIAL  Pt will improve FGA to >/= 23/30 to demonstrate improved balance and reduced fall risk  Baseline: 18/30 Goal status: INITIAL  Pt will improve gait speed to >/= 1.39m/s to demonstrate improved community ambulation  Baseline: 1.4m/s Goal status: INITIAL  Pt will improve 5x STS to </= 10 sec to demo improved functional LE strength and balance   Baseline: 13.19s Goal status: INITIAL  MCTSIB goal Baseline:  not needed Goal status: INITIAL  ASSESSMENT:  CLINICAL IMPRESSION: Aquatic PT session focused on balance and gait training in 4' - 4.5' water depth with pt using small barbells for increased resistance and strengthening.  Pt demonstrating improved balance with aquatic exercise with minimal intermittent UE support on pool edge for assist with balance recovery.   Continue POC.   OBJECTIVE IMPAIRMENTS: Abnormal gait, cardiopulmonary status limiting activity, decreased activity tolerance, decreased balance, decreased endurance, decreased knowledge of condition, difficulty walking, decreased strength, impaired flexibility, impaired sensation, postural dysfunction, and pain.   ACTIVITY LIMITATIONS: carrying, lifting, sitting, standing, stairs, locomotion level, and caring for  others  PARTICIPATION LIMITATIONS: interpersonal relationship, driving, shopping, community activity, and yard work  PERSONAL FACTORS: Age, Fitness, Past/current experiences, Time since onset of injury/illness/exacerbation, and 3+ comorbidities: see above  are also affecting patient's functional outcome.   REHAB POTENTIAL: Good  CLINICAL DECISION MAKING: Stable/uncomplicated  EVALUATION COMPLEXITY: Low  PLAN:  PT FREQUENCY: 2x/week  PT DURATION: 8 weeks  PLANNED INTERVENTIONS: Therapeutic exercises, Therapeutic activity, Neuromuscular re-education, Balance training, Gait training, Patient/Family education, Self Care, Joint mobilization, Stair training, Vestibular training, Visual/preceptual remediation/compensation, Orthotic/Fit training, DME instructions, Aquatic Therapy, Dry Needling, Electrical stimulation, Cryotherapy, Moist heat, Manual therapy, and Re-evaluation  PLAN FOR NEXT SESSION: cont balance training - add increased vestibular exercises as tolerated; dynamic gait training   Rande Dario, Donavan Burnet, PT  01/27/2023, 6:50 PM

## 2023-01-31 ENCOUNTER — Ambulatory Visit: Payer: Self-pay | Admitting: Physical Therapy

## 2023-02-01 ENCOUNTER — Encounter: Payer: Self-pay | Admitting: Physical Therapy

## 2023-02-01 ENCOUNTER — Ambulatory Visit: Payer: Medicare Other | Admitting: Physical Therapy

## 2023-02-01 DIAGNOSIS — R2681 Unsteadiness on feet: Secondary | ICD-10-CM | POA: Diagnosis not present

## 2023-02-01 DIAGNOSIS — R2689 Other abnormalities of gait and mobility: Secondary | ICD-10-CM

## 2023-02-01 NOTE — Therapy (Unsigned)
OUTPATIENT PHYSICAL THERAPY NEURO TREATMENT/AQUATIC THERAPY   Patient Name: Larry Stone MRN: [id number] DOB:March 13, 1951, 72 y.o., male Today's Date: 02/02/2023   PCP: Tresa Garter, MD REFERRING PROVIDER: Tresa Garter, MD  END OF SESSION:  PT End of Session - 02/01/23 1256     Visit Number 14    Number of Visits 17    Date for PT Re-Evaluation 02/18/23    Authorization Type [employer] medicare    PT Start Time 1105    PT Stop Time 1146    PT Time Calculation (min) 41 min    Equipment Utilized During Treatment --   bar bells, aquatic cuffs   Activity Tolerance Patient tolerated treatment well    Behavior During Therapy WFL for tasks assessed/performed              Past Medical History:  Diagnosis Date   Allergy    Anxiety    CAD    a. acute inferior lateral wall infarction in September 2004 treated medically. b.  cath 06/17/16 showing mild nonobstructive CAD with 30-40% ostial LM (eccentric), elevated LV filling pressures and normal LV function.   Chronic diastolic CHF (congestive heart failure) (HCC)    CKD (chronic kidney disease), stage II    COPD (chronic obstructive pulmonary disease) (HCC)    Diverticulosis    DJD (degenerative joint disease)    GERD (gastroesophageal reflux disease)    HTN (hypertension)    Hyperlipidemia    Low back pain syndrome    Myocardial infarction (HCC) 2004   Obesity    OSA (obstructive sleep apnea)    RBBB    Recurrent aspiration bronchitis/pneumonia    Recurrent aspiration pneumonia (HCC)    Hattie Perch 07/13/2016   Stroke (HCC)    Thrombocytopenia (HCC)    Tubular adenoma of colon 2014   Past Surgical History:  Procedure Laterality Date   CARDIAC CATHETERIZATION     LEFT HEART CATH AND CORONARY ANGIOGRAPHY N/A 06/17/2016   Procedure: Left Heart Cath and Coronary Angiography;  Surgeon: Kathleene Hazel, MD;  Location: [facility] INVASIVE CV LAB;  Service: Cardiovascular;  Laterality: N/A;   UVULOPALATOPHARYNGOPLASTY      surgery for OSA   Patient Active Problem List   Diagnosis Date Noted   BPH (benign prostatic hyperplasia) 08/05/2022   Hearing problem 04/06/2022   COVID-19 12/10/2021   Cough 11/26/2021   Fever 11/26/2021   Wheezing 11/26/2021   Hamstring tendon rupture 06/24/2021   Hip pain, chronic, right 02/17/2021   Paresthesias 09/01/2020   Neck pain on left side 02/28/2020   Allergic rhinitis 12/26/2019   History of CVA (cerebrovascular accident) 12/05/2019   Right hemiparesis (HCC) 12/04/2019   Chronic renal insufficiency, stage 3 (moderate) (HCC) 07/05/2019   Mild cognitive disorder 10/12/2017   Viral pneumonitis 07/22/2016   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/2016   Depression with anxiety 07/13/2016   Acute respiratory failure with hypoxia (HCC) 07/13/2016   Chronic diastolic CHF (congestive heart failure) (HCC) 07/13/2016   Sepsis, unspecified organism (HCC) 07/13/2016   Acute pain of right knee 06/30/2016   Fatigue 06/30/2016   Creatinine elevation 06/30/2016   Dyspnea on exertion    Laryngopharyngeal reflux (LPR) 07/07/2015   COPD with acute exacerbation (HCC) 04/04/2015   Elevated IgE level 03/18/2014   Well adult exam 03/05/2014   COPD with chronic bronchitis (HCC) 12/13/2013   Venous insufficiency 11/03/2011   Edema 11/03/2011   Memory disturbance 05/13/2011   CAD (coronary artery disease) 09/17/2010   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/18/2010   Incidental pulmonary nodule 06/17/2009   COLONIC POLYPS 10/17/2007   BRONCHITIS, RECURRENT 10/17/2007   Diverticulosis of large intestine 10/17/2007   Osteoarthritis 10/17/2007   Hyperglycemia 10/17/2007   Dyslipidemia 10/02/2007   Anxiety disorder 10/02/2007   Obstructive sleep apnea 10/02/2007   Essential hypertension 10/02/2007   LOW BACK PAIN SYNDROME 10/02/2007    ONSET DATE: 11/17/2022  REFERRING DIAG: M54.40,G89.29 (ICD-10-CM) - Chronic midline low back pain with sciatica, sciatica laterality unspecified G81.91 (ICD-10-CM) -  Right hemiparesis (HCC)  THERAPY DIAG:  Unsteadiness on feet  Other abnormalities of gait and mobility  Rationale for Evaluation and Treatment: Rehabilitation  SUBJECTIVE:                                                                                                                                                                                             SUBJECTIVE STATEMENT: Patient reports he is doing well - did Yoga on Monday - says some of the stretches/moves were challenging.   Pt accompanied by: self  PERTINENT HISTORY: BPH, h/o CVA, anxiety, GERD, CHF, MI, COPD, HTN, HLD, CKD  PAIN:  Are you having pain?  No    PRECAUTIONS: Fall  PATIENT GOALS: "I need to get my balance and gait back"   OBJECTIVE:   DIAGNOSTIC FINDINGS:  Lumbar mri 11/2020:  IMPRESSION: 1. Lumbar spine spondylosis as described above. 2. No acute osseous injury of the lumbar spine.  R hip MRI IMPRESSION: 1. Complete tear of the hamstring component of the adductor magnus tendon origin from the ischial tuberosity with approximately 2.4 cm retraction with fluid-filled gap. 2. Tendinosis with low-grade partial tearing of the semimembranosus tendon as well as the conjoint tendon of the long head biceps femoris and semitendinosus without a full-thickness or retracted component.   TODAY'S TREATMENT: 02-01-23   NeuroRe-ed:    5x sit to stand - 9.6 secs without UE support from mat table  Standing Balance: Surface: Airex  Position: Narrow Base of Support Feet Hip Width Apart Completed with: Eyes Open and Eyes Closed; Head Turns x 5 Reps and Head Nods x 5 Reps  Pt performed stepping down to floor from Airex 5 reps each LE  Rockerboard inside // bars 10 reps with EO:  10 reps with EC with minimal UE support each hand on // bars  Stepping down to floor from rockerboard 5 reps forward each foot, 5 reps backward each foot  Cone taps (3) - standing on floor - touching each cone 5 reps with each  foot - with UE support prn  Standing on inverted Bosu inside // bars; Squats 5 reps  with min. UE support; placed each leg in center - moved other leg forward/back and then laterally 5 reps each direction, each leg for improved SLS on each leg  Tandem walking inside // bars - 10' x 2 reps  Sidestepping on blue foam balance beam 2 reps inside // bars with UE support prn      GOALS: Goals reviewed with patient? Yes  SHORT TERM GOALS: Target date: 01/07/23  Pt will be independent with initial HEP for improved balance and gait mechanics  Baseline: to be provided Goal status: MET  2.  Pt will improve FGA to >/= 23/30 to demonstrate improved balance and reduced fall risk  Baseline: 18/30;  01-04-23 - score 22/30; 26/30 Goal status:  MET  3.  Pt will improve gait speed to >/= 1.23m/s to demonstrate improved community ambulation  Baseline: 1.52m/s;  9.37 secs = 1.07 m/sec ;  2nd trial  7.41 secs = 1.35 m/sec; 1.22m/s Goal status: Goal met 01-04-23  4.  Pt will improve 5x STS to </= 10 sec to demo improved functional LE strength and balance   Baseline: 13.19s; 01-04-23:   12.78 secs without UE support; 12.17s no UE Goal status:  Partially met 01-04-23  5.  Patient will complete >/= 30s on condition 4 of MCTSIB to demonstrate improved balance Baseline:  23s:  30 secs on 01-04-23 Goal status: Goal met    LONG TERM GOALS: Target date: 02/18/23  Pt will be independent with final HEP for improved balance and gait mechanics  Baseline: to be provided Goal status: INITIAL  Pt will improve FGA to >/= 23/30 to demonstrate improved balance and reduced fall risk  Baseline: 18/30 Goal status: INITIAL  Pt will improve gait speed to >/= 1.30m/s to demonstrate improved community ambulation  Baseline: 1.8m/s Goal status: INITIAL  Pt will improve 5x STS to </= 10 sec to demo improved functional LE strength and balance   Baseline: 13.19s Goal status: INITIAL  MCTSIB goal Baseline:  not  needed Goal status: INITIAL  ASSESSMENT:  CLINICAL IMPRESSION: PT session focused on balance training on compliant and noncompliant surfaces to improve SLS on each leg.  Pt able to perform high level balance activities with minimal UE support on // bars.  Pt continues to have unsteadiness/postural instability with standing with EC on compliant/moving surfaces, indicative of decreased vestibular input in maintaining balance, however, this has improved since performance at initial eval.  Continue POC.   OBJECTIVE IMPAIRMENTS: Abnormal gait, cardiopulmonary status limiting activity, decreased activity tolerance, decreased balance, decreased endurance, decreased knowledge of condition, difficulty walking, decreased strength, impaired flexibility, impaired sensation, postural dysfunction, and pain.   ACTIVITY LIMITATIONS: carrying, lifting, sitting, standing, stairs, locomotion level, and caring for others  PARTICIPATION LIMITATIONS: interpersonal relationship, driving, shopping, community activity, and yard work  PERSONAL FACTORS: Age, Fitness, Past/current experiences, Time since onset of injury/illness/exacerbation, and 3+ comorbidities: see above  are also affecting patient's functional outcome.   REHAB POTENTIAL: Good  CLINICAL DECISION MAKING: Stable/uncomplicated  EVALUATION COMPLEXITY: Low  PLAN:  PT FREQUENCY: 2x/week  PT DURATION: 8 weeks  PLANNED INTERVENTIONS: Therapeutic exercises, Therapeutic activity, Neuromuscular re-education, Balance training, Gait training, Patient/Family education, Self Care, Joint mobilization, Stair training, Vestibular training, Visual/preceptual remediation/compensation, Orthotic/Fit training, DME instructions, Aquatic Therapy, Dry Needling, Electrical stimulation, Cryotherapy, Moist heat, Manual therapy, and Re-evaluation  PLAN FOR NEXT SESSION: Check LTG's next land session; final 2 sessions to be aquatic sessions; cont balance training - add  increased vestibular exercises as tolerated; dynamic gait  training   Peniel Hass Suzanne, PT  02/02/2023, 6:45 PM

## 2023-02-02 ENCOUNTER — Ambulatory Visit: Payer: Medicare Other | Admitting: Physical Therapy

## 2023-02-03 ENCOUNTER — Ambulatory Visit: Payer: Medicare Other

## 2023-02-03 ENCOUNTER — Ambulatory Visit: Payer: Medicare Other | Admitting: Physical Therapy

## 2023-02-08 ENCOUNTER — Ambulatory Visit: Payer: Medicare Other | Admitting: Physical Therapy

## 2023-02-08 DIAGNOSIS — R2681 Unsteadiness on feet: Secondary | ICD-10-CM | POA: Diagnosis not present

## 2023-02-08 DIAGNOSIS — M6281 Muscle weakness (generalized): Secondary | ICD-10-CM

## 2023-02-08 DIAGNOSIS — R2689 Other abnormalities of gait and mobility: Secondary | ICD-10-CM

## 2023-02-08 NOTE — Therapy (Unsigned)
OUTPATIENT PHYSICAL THERAPY NEURO TREATMENT   Patient Name: Larry Stone MRN: [id number] DOB:[birth year], 72 y.o., male Today's Date: 02/09/2023   PCP: Tresa Garter, MD REFERRING PROVIDER: Tresa Garter, MD  END OF SESSION:  PT End of Session - 02/09/23 2055     Visit Number 15    Number of Visits 17    Date for PT Re-Evaluation 02/18/23    Authorization Type [employer] medicare    PT Start Time 1025    PT Stop Time 1110    PT Time Calculation (min) 45 min    Activity Tolerance Patient tolerated treatment well    Behavior During Therapy [facility] for tasks assessed/performed               Past Medical History:  Diagnosis Date   Allergy    Anxiety    CAD    a. acute inferior lateral wall infarction in September 2004 treated medically. b.  cath 06/17/16 showing mild nonobstructive CAD with 30-40% ostial LM (eccentric), elevated LV filling pressures and normal LV function.   Chronic diastolic CHF (congestive heart failure) (HCC)    CKD (chronic kidney disease), stage II    COPD (chronic obstructive pulmonary disease) (HCC)    Diverticulosis    DJD (degenerative joint disease)    GERD (gastroesophageal reflux disease)    HTN (hypertension)    Hyperlipidemia    Low back pain syndrome    Myocardial infarction (HCC) 2004   Obesity    OSA (obstructive sleep apnea)    RBBB    Recurrent aspiration bronchitis/pneumonia    Recurrent aspiration pneumonia (HCC)    Hattie Perch 07/13/2016   Stroke (HCC)    Thrombocytopenia (HCC)    Tubular adenoma of colon 2014   Past Surgical History:  Procedure Laterality Date   CARDIAC CATHETERIZATION     LEFT HEART CATH AND CORONARY ANGIOGRAPHY N/A 06/17/2016   Procedure: Left Heart Cath and Coronary Angiography;  Surgeon: Kathleene Hazel, MD;  Location: [facility] INVASIVE CV LAB;  Service: Cardiovascular;  Laterality: N/A;   UVULOPALATOPHARYNGOPLASTY     surgery for OSA   Patient Active Problem List   Diagnosis Date Noted   BPH  (benign prostatic hyperplasia) 08/05/2022   Hearing problem 04/06/2022   COVID-19 12/10/2021   Cough 11/26/2021   Fever 11/26/2021   Wheezing 11/26/2021   Hamstring tendon rupture 06/24/2021   Hip pain, chronic, right 02/17/2021   Paresthesias 09/01/2020   Neck pain on left side 02/28/2020   Allergic rhinitis 12/26/2019   History of CVA (cerebrovascular accident) 12/05/2019   Right hemiparesis (HCC) 12/04/2019   Chronic renal insufficiency, stage 3 (moderate) (HCC) 07/05/2019   Mild cognitive disorder 10/12/2017   Viral pneumonitis 07/22/2016   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/2016   Depression with anxiety 07/13/2016   Acute respiratory failure with hypoxia (HCC) 07/13/2016   Chronic diastolic CHF (congestive heart failure) (HCC) 07/13/2016   Sepsis, unspecified organism (HCC) 07/13/2016   Acute pain of right knee 06/30/2016   Fatigue 06/30/2016   Creatinine elevation 06/30/2016   Dyspnea on exertion    Laryngopharyngeal reflux (LPR) 07/07/2015   COPD with acute exacerbation (HCC) 04/04/2015   Elevated IgE level 03/18/2014   Well adult exam 03/05/2014   COPD with chronic bronchitis (HCC) 12/13/2013   Venous insufficiency 11/03/2011   Edema 11/03/2011   Memory disturbance 05/13/2011   CAD (coronary artery disease) 09/17/2010   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   Incidental pulmonary nodule 06/17/2009   COLONIC POLYPS 10/17/2007  BRONCHITIS, RECURRENT 10/17/2007   Diverticulosis of large intestine 10/17/2007   Osteoarthritis 10/17/2007   Hyperglycemia 10/17/2007   Dyslipidemia 10/02/2007   Anxiety disorder 10/02/2007   Obstructive sleep apnea 10/02/2007   Essential hypertension 10/02/2007   LOW BACK PAIN SYNDROME 10/02/2007    ONSET DATE: 11/17/2022  REFERRING DIAG: M54.40,G89.29 (ICD-10-CM) - Chronic midline low back pain with sciatica, sciatica laterality unspecified G81.91 (ICD-10-CM) - Right hemiparesis (HCC)  THERAPY DIAG:  Unsteadiness on feet  Other  abnormalities of gait and mobility  Muscle weakness (generalized)  Rationale for Evaluation and Treatment: Rehabilitation  SUBJECTIVE:                                                                                                                                                                                             SUBJECTIVE STATEMENT: Patient reports he is feeling better - had upset stomach/GI issues last week due to eating some salad that wasn't good Pt accompanied by: self  PERTINENT HISTORY: BPH, h/o CVA, anxiety, GERD, CHF, MI, COPD, HTN, HLD, CKD  PAIN:  Are you having pain? No    PRECAUTIONS: Fall  PATIENT GOALS: "I need to get my balance and gait back"   OBJECTIVE:   DIAGNOSTIC FINDINGS:  Lumbar mri 11/2020:  IMPRESSION: 1. Lumbar spine spondylosis as described above. 2. No acute osseous injury of the lumbar spine.  R hip MRI IMPRESSION: 1. Complete tear of the hamstring component of the adductor magnus tendon origin from the ischial tuberosity with approximately 2.4 cm retraction with fluid-filled gap. 2. Tendinosis with low-grade partial tearing of the semimembranosus tendon as well as the conjoint tendon of the long head biceps femoris and semitendinosus without a full-thickness or retracted component.   TODAY'S TREATMENT:  Gait:  FGA   02/08/23 0001  Functional Gait  Assessment  Gait Level Surface 3  Change in Gait Speed 3  Gait with Horizontal Head Turns 3  Gait with Vertical Head Turns 3  Gait and Pivot Turn 3  Step Over Obstacle 3  Gait with Narrow Base of Support 3  Gait with Eyes Closed 3  Ambulating Backwards 3  Steps 3  Total Score 30   Gait velocity:  11.3 secs = 1.13 m/sec without device  NeuroRe-ed: 5x sit to stand transfers - score 8.56 secs from mat without UE support   Pt performed standing on foam with EO for 30 secs; with EC for 30 secs with mild postural sway but no LOB  Single limb stance - RLE - 9.34 secs:  LLE -  8.84 secs  Reviewed LTG's and progress towards goals with pt - pt  has attended 15/17 visits - requests for final 2 visits to be aquatic PT sessions  GOALS: Goals reviewed with patient? Yes  SHORT TERM GOALS: Target date: 01/07/23  Pt will be independent with initial HEP for improved balance and gait mechanics  Baseline: to be provided Goal status: MET  2.  Pt will improve FGA to >/= 23/30 to demonstrate improved balance and reduced fall risk  Baseline: 18/30;  01-04-23 - score 22/30; 26/30 Goal status:  MET  3.  Pt will improve gait speed to >/= 1.30m/s to demonstrate improved community ambulation  Baseline: 1.67m/s;  9.37 secs = 1.07 m/sec ;  2nd trial  7.41 secs = 1.35 m/sec; 1.76m/s Goal status: Goal met 01-04-23  4.  Pt will improve 5x STS to </= 10 sec to demo improved functional LE strength and balance   Baseline: 13.19s; 01-04-23:   12.78 secs without UE support; 12.17s no UE Goal status:  Partially met 01-04-23  5.  Patient will complete >/= 30s on condition 4 of MCTSIB to demonstrate improved balance Baseline:  23s:  30 secs on 01-04-23 Goal status: Goal met    LONG TERM GOALS: Target date: 02/18/23  Pt will be independent with final HEP for improved balance and gait mechanics  Baseline: to be provided Goal status: Goal met 02-08-23  Pt will improve FGA to >/= 23/30 to demonstrate improved balance and reduced fall risk  Baseline: 18/30; score 30/30 Goal status: Goal met 02-08-23  Pt will improve gait speed to >/= 1.72m/s to demonstrate improved community ambulation  Baseline: 1.51m/s; 1.13 m/sec Goal status: Goal partially met 02-08-23  Pt will improve 5x STS to </= 10 sec to demo improved functional LE strength and balance   Baseline: 13.19s  8.56 secs from mat - 02-08-23 Goal status: Goal met  MCTSIB goal Baseline:  not needed Goal status: DEFERRED  ASSESSMENT:  CLINICAL IMPRESSION: Patient has met LTG's #1, 2 and 4:  LTG #3 closely approximated as  gait velocity is 1.13 m/sec (goal set at 1.15 m/sec); LTG #5 had been deferred as pt able to maintain balance for 30 secs on all 4 conditions. Cont. Aquatic PT for 2 sessions to finalize and update HEP.    OBJECTIVE IMPAIRMENTS: Abnormal gait, cardiopulmonary status limiting activity, decreased activity tolerance, decreased balance, decreased endurance, decreased knowledge of condition, difficulty walking, decreased strength, impaired flexibility, impaired sensation, postural dysfunction, and pain.   ACTIVITY LIMITATIONS: carrying, lifting, sitting, standing, stairs, locomotion level, and caring for others  PARTICIPATION LIMITATIONS: interpersonal relationship, driving, shopping, community activity, and yard work  PERSONAL FACTORS: Age, Fitness, Past/current experiences, Time since onset of injury/illness/exacerbation, and 3+ comorbidities: see above  are also affecting patient's functional outcome.   REHAB POTENTIAL: Good  CLINICAL DECISION MAKING: Stable/uncomplicated  EVALUATION COMPLEXITY: Low  PLAN:  PT FREQUENCY: 2x/week  PT DURATION: 8 weeks  PLANNED INTERVENTIONS: Therapeutic exercises, Therapeutic activity, Neuromuscular re-education, Balance training, Gait training, Patient/Family education, Self Care, Joint mobilization, Stair training, Vestibular training, Visual/preceptual remediation/compensation, Orthotic/Fit training, DME instructions, Aquatic Therapy, Dry Needling, Electrical stimulation, Cryotherapy, Moist heat, Manual therapy, and Re-evaluation  PLAN FOR NEXT SESSION: cont balance training - add increased vestibular exercises as tolerated; dynamic gait training   Nikala Walsworth, Donavan Burnet, PT   02/09/2023, 8:58 PM

## 2023-02-09 ENCOUNTER — Encounter: Payer: Self-pay | Admitting: Physical Therapy

## 2023-02-09 ENCOUNTER — Ambulatory Visit: Payer: Self-pay

## 2023-02-09 ENCOUNTER — Encounter: Payer: Self-pay | Admitting: Podiatry

## 2023-02-09 ENCOUNTER — Ambulatory Visit: Payer: Medicare Other | Admitting: Podiatry

## 2023-02-09 DIAGNOSIS — B351 Tinea unguium: Secondary | ICD-10-CM

## 2023-02-09 DIAGNOSIS — M79675 Pain in left toe(s): Secondary | ICD-10-CM

## 2023-02-09 DIAGNOSIS — M79674 Pain in right toe(s): Secondary | ICD-10-CM | POA: Diagnosis not present

## 2023-02-09 NOTE — Progress Notes (Signed)
  Subjective:  Patient ID: Larry Stone, male    DOB: Jul 16, 1950,  MRN: [id number]  Chief Complaint  Patient presents with   Toe Pain    PATIENT STATES THAT HIS HALLUX ON THE RF HURTS AND WANTS TO KNOW IF HE CAN GET HIS TOE NAILS CUT . NO MEDICATION FOR PAIN .    72 y.o. male returns for the above complaint.  Patient presents with thickened and onychodystrophy mycotic toenails x 10 mild pain on palpation of hurts with ambulation hurts with pressure he has not seen anyone as prior to seeing me denies any other acute complaints For me debride down he is not able to do it himself.  Objective:  There were no vitals filed for this visit. Podiatric Exam: Vascular: dorsalis pedis and posterior tibial pulses are palpable bilateral. Capillary return is immediate. Temperature gradient is WNL. Skin turgor WNL  Sensorium: Normal Semmes Weinstein monofilament test. Normal tactile sensation bilaterally. Nail Exam: Pt has thick disfigured discolored nails with subungual debris noted bilateral entire nail hallux through fifth toenails.  Pain on palpation to the nails. Ulcer Exam: There is no evidence of ulcer or pre-ulcerative changes or infection. Orthopedic Exam: Muscle tone and strength are WNL. No limitations in general ROM. No crepitus or effusions noted.  Skin: No Porokeratosis. No infection or ulcers    Assessment & Plan:   1. Pain due to onychomycosis of toenails of both feet     Patient was evaluated and treated and all questions answered.  Onychomycosis with pain  -Nails palliatively debrided as below. -Educated on self-care  Procedure: Nail Debridement Rationale: pain  Type of Debridement: manual, sharp debridement. Instrumentation: Nail nipper, rotary burr. Number of Nails: 10  Procedures and Treatment: Consent by patient was obtained for treatment procedures. The patient understood the discussion of treatment and procedures well. All questions were answered thoroughly reviewed.  Debridement of mycotic and hypertrophic toenails, 1 through 5 bilateral and clearing of subungual debris. No ulceration, no infection noted.  Return Visit-Office Procedure: Patient instructed to return to the office for a follow up visit 3 months for continued evaluation and treatment.  Nicholes Rough, DPM    Return in about 3 months (around 05/12/2023).

## 2023-02-10 NOTE — Progress Notes (Signed)
   02/08/23 0001  Functional Gait  Assessment  Gait Level Surface 3  Change in Gait Speed 3  Gait with Horizontal Head Turns 3  Gait with Vertical Head Turns 3  Gait and Pivot Turn 3  Step Over Obstacle 3  Gait with Narrow Base of Support 3  Gait with Eyes Closed 3  Ambulating Backwards 3  Steps 3  Total Score 30

## 2023-02-14 ENCOUNTER — Ambulatory Visit: Payer: Medicare Other | Admitting: Physical Therapy

## 2023-02-14 DIAGNOSIS — R2681 Unsteadiness on feet: Secondary | ICD-10-CM | POA: Diagnosis not present

## 2023-02-14 DIAGNOSIS — R2689 Other abnormalities of gait and mobility: Secondary | ICD-10-CM

## 2023-02-14 DIAGNOSIS — M6281 Muscle weakness (generalized): Secondary | ICD-10-CM

## 2023-02-15 ENCOUNTER — Encounter: Payer: Self-pay | Admitting: Physical Therapy

## 2023-02-15 NOTE — Therapy (Signed)
OUTPATIENT PHYSICAL THERAPY NEURO TREATMENT/AQUATIC THERAPY   Patient Name: ZACKARI KRISTOFF MRN: [id number] DOB:1951-02-07, 72 y.o., male Today's Date: 02/15/2023   PCP: Tresa Garter, MD REFERRING PROVIDER: Tresa Garter, MD  END OF SESSION:  PT End of Session - 02/15/23 1050     Visit Number 16    Number of Visits 17    Date for PT Re-Evaluation 02/18/23    Authorization Type [employer] medicare    PT Start Time 1215    PT Stop Time 1315    PT Time Calculation (min) 60 min    Equipment Utilized During Treatment Other (comment)   bar bells, aquatic cuffs   Activity Tolerance Patient tolerated treatment well    Behavior During Therapy WFL for tasks assessed/performed              Past Medical History:  Diagnosis Date   Allergy    Anxiety    CAD    a. acute inferior lateral wall infarction in September 2004 treated medically. b.  cath 06/17/16 showing mild nonobstructive CAD with 30-40% ostial LM (eccentric), elevated LV filling pressures and normal LV function.   Chronic diastolic CHF (congestive heart failure) (HCC)    CKD (chronic kidney disease), stage II    COPD (chronic obstructive pulmonary disease) (HCC)    Diverticulosis    DJD (degenerative joint disease)    GERD (gastroesophageal reflux disease)    HTN (hypertension)    Hyperlipidemia    Low back pain syndrome    Myocardial infarction (HCC) 2004   Obesity    OSA (obstructive sleep apnea)    RBBB    Recurrent aspiration bronchitis/pneumonia    Recurrent aspiration pneumonia (HCC)    Hattie Perch 07/13/2016   Stroke (HCC)    Thrombocytopenia (HCC)    Tubular adenoma of colon 2014   Past Surgical History:  Procedure Laterality Date   CARDIAC CATHETERIZATION     LEFT HEART CATH AND CORONARY ANGIOGRAPHY N/A 06/17/2016   Procedure: Left Heart Cath and Coronary Angiography;  Surgeon: Kathleene Hazel, MD;  Location: [facility] INVASIVE CV LAB;  Service: Cardiovascular;  Laterality: N/A;    UVULOPALATOPHARYNGOPLASTY     surgery for OSA   Patient Active Problem List   Diagnosis Date Noted   BPH (benign prostatic hyperplasia) 08/05/2022   Hearing problem 04/06/2022   COVID-19 12/10/2021   Cough 11/26/2021   Fever 11/26/2021   Wheezing 11/26/2021   Hamstring tendon rupture 06/24/2021   Hip pain, chronic, right 02/17/2021   Paresthesias 09/01/2020   Neck pain on left side 02/28/2020   Allergic rhinitis 12/26/2019   History of CVA (cerebrovascular accident) 12/05/2019   Right hemiparesis (HCC) 12/04/2019   Chronic renal insufficiency, stage 3 (moderate) (HCC) 07/05/2019   Mild cognitive disorder 10/12/2017   Viral pneumonitis 07/22/2016   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/2016   Depression with anxiety 07/13/2016   Acute respiratory failure with hypoxia (HCC) 07/13/2016   Chronic diastolic CHF (congestive heart failure) (HCC) 07/13/2016   Sepsis, unspecified organism (HCC) 07/13/2016   Acute pain of right knee 06/30/2016   Fatigue 06/30/2016   Creatinine elevation 06/30/2016   Dyspnea on exertion    Laryngopharyngeal reflux (LPR) 07/07/2015   COPD with acute exacerbation (HCC) 04/04/2015   Elevated IgE level 03/18/2014   Well adult exam 03/05/2014   COPD with chronic bronchitis (HCC) 12/13/2013   Venous insufficiency 11/03/2011   Edema 11/03/2011   Memory disturbance 05/13/2011   CAD (coronary artery disease) 09/17/2010  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   Incidental pulmonary nodule 06/17/2009   COLONIC POLYPS 10/17/2007   BRONCHITIS, RECURRENT 10/17/2007   Diverticulosis of large intestine 10/17/2007   Osteoarthritis 10/17/2007   Hyperglycemia 10/17/2007   Dyslipidemia 10/02/2007   Anxiety disorder 10/02/2007   Obstructive sleep apnea 10/02/2007   Essential hypertension 10/02/2007   LOW BACK PAIN SYNDROME 10/02/2007    ONSET DATE: 11/17/2022  REFERRING DIAG: M54.40,G89.29 (ICD-10-CM) - Chronic midline low back pain with sciatica, sciatica laterality  unspecified G81.91 (ICD-10-CM) - Right hemiparesis (HCC)  THERAPY DIAG:  Unsteadiness on feet  Other abnormalities of gait and mobility  Muscle weakness (generalized)  Rationale for Evaluation and Treatment: Rehabilitation  SUBJECTIVE:                                                                                                                                                                                             SUBJECTIVE STATEMENT: Patient reports he is doing well - did Yoga on Monday - says some of the stretches/moves were challenging.   Pt accompanied by: self  PERTINENT HISTORY: BPH, h/o CVA, anxiety, GERD, CHF, MI, COPD, HTN, HLD, CKD  PAIN:  Are you having pain?  No    PRECAUTIONS: Fall  PATIENT GOALS: "I need to get my balance and gait back"   OBJECTIVE:   DIAGNOSTIC FINDINGS:  Lumbar mri 11/2020:  IMPRESSION: 1. Lumbar spine spondylosis as described above. 2. No acute osseous injury of the lumbar spine.  R hip MRI IMPRESSION: 1. Complete tear of the hamstring component of the adductor magnus tendon origin from the ischial tuberosity with approximately 2.4 cm retraction with fluid-filled gap. 2. Tendinosis with low-grade partial tearing of the semimembranosus tendon as well as the conjoint tendon of the long head biceps femoris and semitendinosus without a full-thickness or retracted component.   TODAY'S TREATMENT: 02-14-23     Aquatic therapy at [facility] - pool temp 90 degrees  Patient seen for aquatic therapy today.  Treatment took place in water 3.6- 4.5 feet deep depending upon activity.  Pt entered & exited the pool via step negotiation with use of hand rails independently.  Pt performed runner's stretch RLE and LLE 30 sec hold x 1 rep each;  gastroc stretch with bil. Forefeet on pool wall 30 sec hold x 1 rep  Warm up - forwards 18' x 4 reps, backwards and sideways ambulation 18' x 4 reps each direction  Marching slowly in place with  contralateral UE flexion 10 reps each leg; progressed to adding barbells in each hand for increased resistance 10 reps  Marching forward across pool with barbells 18' x 4  reps - 1-2 sec hold for improved SLS on stance leg; marching backwards 18' x 2 reps with bar bells in each hand for contralateral UE/LE movement  Crossovers front 18' x 2 reps; stepping behind 18' x 2 reps   Sidestepping with squats 18' x 4 reps across pool - with small barbells for increased resistance for increased core stabilization  Pt performed hip flexion, abduction, adduction and extension RLE and LLE with aquatic cuff for increased resistance with eccentric contraction 10 reps each leg each direction  Ai Chi posture - "Balancing" x 10 reps with tactile cue for correct UE movement with LE movement; "Freeing" x 10 reps  Pt requires buoyancy of water for support for reduced fall risk and for unloading/reduced stress on joints and spine as pt able to tolerate increased standing and ambulation in water compared to tolerance on land.  Viscosity of water is needed for resistance for strengthening and current of water provides perturbations for challenge for balance training: buoyancy of water needed for spinal decompression for reduced pain with weight bearing activities & exercises in water.                                                                                                                    GOALS: Goals reviewed with patient? Yes  SHORT TERM GOALS: Target date: 01/07/23  Pt will be independent with initial HEP for improved balance and gait mechanics  Baseline: to be provided Goal status: MET  2.  Pt will improve FGA to >/= 23/30 to demonstrate improved balance and reduced fall risk  Baseline: 18/30;  01-04-23 - score 22/30; 26/30 Goal status:  MET  3.  Pt will improve gait speed to >/= 1.52m/s to demonstrate improved community ambulation  Baseline: 1.35m/s;  9.37 secs = 1.07 m/sec ;  2nd trial   7.41 secs = 1.35 m/sec; 1.20m/s Goal status: Goal met 01-04-23  4.  Pt will improve 5x STS to </= 10 sec to demo improved functional LE strength and balance   Baseline: 13.19s; 01-04-23:   12.78 secs without UE support; 12.17s no UE Goal status:  Partially met 01-04-23  5.  Patient will complete >/= 30s on condition 4 of MCTSIB to demonstrate improved balance Baseline:  23s:  30 secs on 01-04-23 Goal status: Goal met    LONG TERM GOALS: Target date: 02/18/23  Pt will be independent with final HEP for improved balance and gait mechanics  Baseline: to be provided Goal status: Goal met 02-08-23  Pt will improve FGA to >/= 23/30 to demonstrate improved balance and reduced fall risk  Baseline: 18/30; score 30/30 Goal status: Goal met 02-08-23  Pt will improve gait speed to >/= 1.41m/s to demonstrate improved community ambulation  Baseline: 1.64m/s; 1.13 m/sec Goal status: Goal partially met 02-08-23  Pt will improve 5x STS to </= 10 sec to demo improved functional LE strength and balance   Baseline: 13.19s  8.56 secs from mat - 02-08-23 Goal status: Goal met  MCTSIB goal  Baseline:  not needed Goal status: DEFERRED   ASSESSMENT:  CLINICAL IMPRESSION: Aquatic PT session focused on balance training and LE strengthening.  Pt's balance is improving with aquatic exercises so that only minimal UE support needed on edge of pool for assist with balance recovery.  Pt is progressing well - 1 additional session needed to finalize aquatic HEP.  Continue POC.   OBJECTIVE IMPAIRMENTS: Abnormal gait, cardiopulmonary status limiting activity, decreased activity tolerance, decreased balance, decreased endurance, decreased knowledge of condition, difficulty walking, decreased strength, impaired flexibility, impaired sensation, postural dysfunction, and pain.   ACTIVITY LIMITATIONS: carrying, lifting, sitting, standing, stairs, locomotion level, and caring for others  PARTICIPATION LIMITATIONS:  interpersonal relationship, driving, shopping, community activity, and yard work  PERSONAL FACTORS: Age, Fitness, Past/current experiences, Time since onset of injury/illness/exacerbation, and 3+ comorbidities: see above  are also affecting patient's functional outcome.   REHAB POTENTIAL: Good  CLINICAL DECISION MAKING: Stable/uncomplicated  EVALUATION COMPLEXITY: Low  PLAN:  PT FREQUENCY: 2x/week  PT DURATION: 8 weeks  PLANNED INTERVENTIONS: Therapeutic exercises, Therapeutic activity, Neuromuscular re-education, Balance training, Gait training, Patient/Family education, Self Care, Joint mobilization, Stair training, Vestibular training, Visual/preceptual remediation/compensation, Orthotic/Fit training, DME instructions, Aquatic Therapy, Dry Needling, Electrical stimulation, Cryotherapy, Moist heat, Manual therapy, and Re-evaluation  PLAN FOR NEXT SESSION:  finalize aquatic HEP - cont balance training - add increased vestibular exercises as tolerated; dynamic gait training   Alter Moss, Donavan Burnet, PT  02/15/2023, 10:53 AM

## 2023-02-16 ENCOUNTER — Ambulatory Visit: Payer: Medicare Other | Admitting: Podiatry

## 2023-02-16 ENCOUNTER — Ambulatory Visit: Payer: Medicare Other | Admitting: Internal Medicine

## 2023-02-16 ENCOUNTER — Encounter: Payer: Self-pay | Admitting: Internal Medicine

## 2023-02-16 VITALS — BP 110/62 | HR 68 | Temp 97.9°F | Ht 69.0 in | Wt 191.0 lb

## 2023-02-16 DIAGNOSIS — R413 Other amnesia: Secondary | ICD-10-CM | POA: Diagnosis not present

## 2023-02-16 DIAGNOSIS — R739 Hyperglycemia, unspecified: Secondary | ICD-10-CM | POA: Diagnosis not present

## 2023-02-16 DIAGNOSIS — F419 Anxiety disorder, unspecified: Secondary | ICD-10-CM | POA: Diagnosis not present

## 2023-02-16 DIAGNOSIS — Z23 Encounter for immunization: Secondary | ICD-10-CM | POA: Diagnosis not present

## 2023-02-16 DIAGNOSIS — F418 Other specified anxiety disorders: Secondary | ICD-10-CM

## 2023-02-16 DIAGNOSIS — G8191 Hemiplegia, unspecified affecting right dominant side: Secondary | ICD-10-CM | POA: Diagnosis not present

## 2023-02-16 DIAGNOSIS — Z8673 Personal history of transient ischemic attack (TIA), and cerebral infarction without residual deficits: Secondary | ICD-10-CM

## 2023-02-16 DIAGNOSIS — S76311S Strain of muscle, fascia and tendon of the posterior muscle group at thigh level, right thigh, sequela: Secondary | ICD-10-CM

## 2023-02-16 LAB — CBC WITH DIFFERENTIAL/PLATELET
Basophils Absolute: 0.1 10*3/uL (ref 0.0–0.1)
Basophils Relative: 0.7 % (ref 0.0–3.0)
Eosinophils Absolute: 0.3 10*3/uL (ref 0.0–0.7)
Eosinophils Relative: 3 % (ref 0.0–5.0)
HCT: 42.9 % (ref 39.0–52.0)
Hemoglobin: 13.9 g/dL (ref 13.0–17.0)
Lymphocytes Relative: 16.9 % (ref 12.0–46.0)
Lymphs Abs: 1.6 10*3/uL (ref 0.7–4.0)
MCHC: 32.5 g/dL (ref 30.0–36.0)
MCV: 95.7 fL (ref 78.0–100.0)
Monocytes Absolute: 1 10*3/uL (ref 0.1–1.0)
Monocytes Relative: 10.1 % (ref 3.0–12.0)
Neutro Abs: 6.6 10*3/uL (ref 1.4–7.7)
Neutrophils Relative %: 69.3 % (ref 43.0–77.0)
Platelets: 168 10*3/uL (ref 150.0–400.0)
RBC: 4.48 Mil/uL (ref 4.22–5.81)
RDW: 13.4 % (ref 11.5–15.5)
WBC: 9.5 10*3/uL (ref 4.0–10.5)

## 2023-02-16 LAB — COMPREHENSIVE METABOLIC PANEL
ALT: 21 U/L (ref 0–53)
AST: 20 U/L (ref 0–37)
Albumin: 3.8 g/dL (ref 3.5–5.2)
Alkaline Phosphatase: 63 U/L (ref 39–117)
BUN: 21 mg/dL (ref 6–23)
CO2: 33 meq/L — ABNORMAL HIGH (ref 19–32)
Calcium: 9.4 mg/dL (ref 8.4–10.5)
Chloride: 103 meq/L (ref 96–112)
Creatinine, Ser: 1.43 mg/dL (ref 0.40–1.50)
GFR: 48.87 mL/min — ABNORMAL LOW (ref >60.00)
Glucose, Bld: 79 mg/dL (ref 70–99)
Potassium: 5.3 meq/L — ABNORMAL HIGH (ref 3.5–5.1)
Sodium: 141 meq/L (ref 135–145)
Total Bilirubin: 0.5 mg/dL (ref 0.2–1.2)
Total Protein: 6.5 g/dL (ref 6.0–8.3)

## 2023-02-16 LAB — TSH: TSH: 2.42 u[IU]/mL (ref 0.35–5.50)

## 2023-02-16 LAB — HEMOGLOBIN A1C: Hgb A1c MFr Bld: 5.8 % (ref 4.6–6.5)

## 2023-02-16 NOTE — Assessment & Plan Note (Signed)
Pain remains Tramadol prn Use a hamstring sleeve for pain

## 2023-02-16 NOTE — Assessment & Plan Note (Signed)
Better on  Aricept 10 mg/d

## 2023-02-16 NOTE — Assessment & Plan Note (Signed)
Will continue on Abilify to 5 mg/d. It is helping

## 2023-02-16 NOTE — Assessment & Plan Note (Signed)
Check A1c. 

## 2023-02-16 NOTE — Progress Notes (Signed)
Subjective:  Patient ID: Larry Stone, male    DOB: [birth year]  Age: 72 y.o. MRN: [id number]  CC: Medical Management of Chronic Issues (3 mnth f/u, Discuss if pt needs Pneumonia Vacc)   HPI Larry Stone presents for anxiety, HTN, CVA Balance is better after PT  Outpatient Medications Prior to Visit  Medication Sig Dispense Refill   ALPRAZolam (XANAX) 0.5 MG tablet TAKE 1 TABLET BY MOUTH THREE TIMES A DAY AS NEEDED 270 tablet 1   ammonium lactate (LAC-HYDRIN) 12 % lotion Apply topically as directed. 400 mL 3   buPROPion (WELLBUTRIN SR) 150 MG 12 hr tablet TAKE 1 TABLET BY MOUTH TWICE A DAY 180 tablet 3   carvedilol (COREG) 25 MG tablet TAKE 1 TABLET BY MOUTH 2 TIMES DAILY WITH A MEAL. 180 tablet 3   clopidogrel (PLAVIX) 75 MG tablet TAKE 1 TABLET BY MOUTH EVERY DAY 90 tablet 3   clotrimazole-betamethasone (LOTRISONE) cream APPLY AS DIRECTED 45 g 11   donepezil (ARICEPT) 10 MG tablet Take 1 tablet (10 mg total) by mouth at bedtime. 30 tablet 3   ezetimibe (ZETIA) 10 MG tablet TAKE 1 TABLET BY MOUTH EVERY DAY 90 tablet 3   Fluticasone-Umeclidin-Vilant (TRELEGY ELLIPTA) 100-62.5-25 MCG/ACT AEPB Inhale 1 puff into the lungs daily. (Patient taking differently: Inhale 1 puff into the lungs as needed.) 1 each 11   hydrALAZINE (APRESOLINE) 50 MG tablet TAKE 1 TABLET BY MOUTH FOUR TIMES A DAY 360 tablet 3   ipratropium-albuterol (DUONEB) 0.5-2.5 (3) MG/3ML SOLN Take 3 mLs by nebulization every 4 (four) hours as needed (every 4-6 hours prn). Take by nebulization every 4-6 hours as needed 360 mL 2   meclizine (ANTIVERT) 25 MG tablet Take 0.5 tablets (12.5 mg total) by mouth 3 (three) times daily as needed for dizziness. 30 tablet 0   metFORMIN (GLUCOPHAGE) 500 MG tablet TAKE 1 TABLET BY MOUTH EVERY DAY WITH BREAKFAST 90 tablet 1   montelukast (SINGULAIR) 10 MG tablet TAKE 1 TABLET BY MOUTH EVERYDAY AT BEDTIME 90 tablet 3   nitroGLYCERIN (NITROSTAT) 0.4 MG SL tablet PLACE 1 TABLET UNDER THE  TONGUE EVERY 5 MINUTES AS NEEDED FOR CHEST PAIN. UP TO 3 DOSES. 75 tablet 1   pravastatin (PRAVACHOL) 40 MG tablet TAKE 1 TABLET BY MOUTH EVERY DAY 90 tablet 3   tamsulosin (FLOMAX) 0.4 MG CAPS capsule Take 1 capsule (0.4 mg total) by mouth daily. 30 capsule 11   traMADol (ULTRAM) 50 MG tablet TAKE 1 TABLET BY MOUTH EVERY 6 HOURS AS NEEDED 120 tablet 1   carbidopa-levodopa (SINEMET IR) 25-100 MG tablet Take 0.5 tablets by mouth 3 (three) times daily. Start with 0.5 tablet twice daily x 1 week and then three times daily 60 tablet 2   ARIPiprazole (ABILIFY) 5 MG tablet TAKE 1 TABLET (5 MG TOTAL) BY MOUTH DAILY. 90 tablet 2   No facility-administered medications prior to visit.    ROS: Review of Systems  Constitutional:  Negative for appetite change, fatigue and unexpected weight change.  HENT:  Negative for congestion, nosebleeds, sneezing, sore throat and trouble swallowing.   Eyes:  Negative for itching and visual disturbance.  Respiratory:  Negative for cough.   Cardiovascular:  Negative for chest pain, palpitations and leg swelling.  Gastrointestinal:  Negative for abdominal distention, blood in stool, diarrhea and nausea.  Genitourinary:  Negative for frequency and hematuria.  Musculoskeletal:  Negative for back pain, gait problem, joint swelling and neck pain.  Skin:  Negative for rash.  Neurological:  Negative for dizziness, tremors, speech difficulty and weakness.  Psychiatric/Behavioral:  Negative for agitation, dysphoric mood and sleep disturbance. The patient is not nervous/anxious.     Objective:  BP 110/62 (BP Location: Right Arm, Patient Position: Sitting, Cuff Size: Normal)   Pulse 68   Temp 97.9 F (36.6 C) (Oral)   Ht 5\' 9"  (1.753 m)   Wt 191 lb (86.6 kg)   SpO2 95%   BMI 28.21 kg/m   BP Readings from Last 3 Encounters:  02/16/23 110/62  12/22/22 (!) 159/67  12/07/22 (!) 157/72    Wt Readings from Last 3 Encounters:  02/16/23 191 lb (86.6 kg)  12/22/22 193  lb 3.2 oz (87.6 kg)  11/17/22 192 lb (87.1 kg)    Physical Exam Constitutional:      General: He is not in acute distress.    Appearance: He is well-developed.     Comments: NAD  Eyes:     Conjunctiva/sclera: Conjunctivae normal.     Pupils: Pupils are equal, round, and reactive to light.  Neck:     Thyroid: No thyromegaly.     Vascular: No JVD.  Cardiovascular:     Rate and Rhythm: Normal rate and regular rhythm.     Heart sounds: Normal heart sounds. No murmur heard.    No friction rub. No gallop.  Pulmonary:     Effort: Pulmonary effort is normal. No respiratory distress.     Breath sounds: Normal breath sounds. No wheezing or rales.  Chest:     Chest wall: No tenderness.  Abdominal:     General: Bowel sounds are normal. There is no distension.     Palpations: Abdomen is soft. There is no mass.     Tenderness: There is no abdominal tenderness. There is no guarding or rebound.  Musculoskeletal:        General: No tenderness. Normal range of motion.     Cervical back: Normal range of motion.  Lymphadenopathy:     Cervical: No cervical adenopathy.  Skin:    General: Skin is warm and dry.     Findings: No rash.  Neurological:     Mental Status: He is alert and oriented to person, place, and time.     Cranial Nerves: No cranial nerve deficit.     Motor: No abnormal muscle tone.     Coordination: Coordination normal.     Gait: Gait normal.     Deep Tendon Reflexes: Reflexes are normal and symmetric.  Psychiatric:        Behavior: Behavior normal.        Thought Content: Thought content normal.        Judgment: Judgment normal.     Lab Results  Component Value Date   WBC 8.9 05/25/2022   HGB 14.1 05/25/2022   HCT 42.1 05/25/2022   PLT 173.0 05/25/2022   GLUCOSE 86 05/25/2022   CHOL 132 01/11/2022   TRIG 86.0 01/11/2022   HDL 47.90 01/11/2022   LDLCALC 67 01/11/2022   ALT 21 05/25/2022   AST 19 05/25/2022   NA 140 05/25/2022   K 4.6 05/25/2022   CL 103  05/25/2022   CREATININE 1.34 05/25/2022   BUN 19 05/25/2022   CO2 27 05/25/2022   TSH 2.27 01/11/2022   PSA 0.33 01/11/2022   INR 1.1 12/04/2019   HGBA1C 5.7 05/25/2022    VAS US CAROTID  Result Date: 06/02/2022 Carotid Arterial Duplex Study Patient Name:  Larry Stone WHITTY Pickup  Date  of Exam:   06/02/2022 Medical Rec #: [id number]        Accession #:    [id number] Date of Birth: 1950/06/27         Patient Gender: M Patient Age:   47 years Exam Location:  [facility] Procedure:      VAS US CAROTID Referring Phys: PRAMOD SETHI --------------------------------------------------------------------------------  Indications:       History of CVA, follow up. Risk Factors:      Hypertension, hyperlipidemia, past history of smoking, prior                    MI, coronary artery disease, prior CVA. Comparison Study:  12-10-2019 Carotid duplex showed 1-39% bilateral ICA Performing Technologist: Jean Rosenthal RDMS, RVT  Examination Guidelines: A complete evaluation includes B-mode imaging, spectral Doppler, color Doppler, and power Doppler as needed of all accessible portions of each vessel. Bilateral testing is considered an integral part of a complete examination. Limited examinations for reoccurring indications may be performed as noted.  Right Carotid Findings: +----------+--------+--------+--------+------------------+--------+           PSV cm/sEDV cm/sStenosisPlaque DescriptionComments +----------+--------+--------+--------+------------------+--------+ CCA Prox  85      16              calcific                   +----------+--------+--------+--------+------------------+--------+ CCA Distal63      17              calcific                   +----------+--------+--------+--------+------------------+--------+ ICA Prox  80      22      1-39%   calcific                   +----------+--------+--------+--------+------------------+--------+ ICA Mid   125     32                                          +----------+--------+--------+--------+------------------+--------+ ICA Distal70      21                                         +----------+--------+--------+--------+------------------+--------+ ECA       125     17              calcific                   +----------+--------+--------+--------+------------------+--------+ +----------+--------+-------+----------------+-------------------+           PSV cm/sEDV cmsDescribe        Arm Pressure (mmHG) +----------+--------+-------+----------------+-------------------+ [id number]            Multiphasic, WNL                    +----------+--------+-------+----------------+-------------------+ +---------+--------+--+--------+--+---------+ VertebralPSV cm/s44EDV cm/s13Antegrade +---------+--------+--+--------+--+---------+  Left Carotid Findings: +----------+--------+--------+--------+------------------+------------------+           PSV cm/sEDV cm/sStenosisPlaque DescriptionComments           +----------+--------+--------+--------+------------------+------------------+ CCA Prox  103     22                                                   +----------+--------+--------+--------+------------------+------------------+  CCA Distal74      16                                intimal thickening +----------+--------+--------+--------+------------------+------------------+ ICA Prox  85      27      1-39%   calcific                             +----------+--------+--------+--------+------------------+------------------+ ICA Mid   93      29                                                   +----------+--------+--------+--------+------------------+------------------+ ICA Distal88      29                                                   +----------+--------+--------+--------+------------------+------------------+ ECA       119     14                                                    +----------+--------+--------+--------+------------------+------------------+ +----------+--------+--------+----------------+-------------------+           PSV cm/sEDV cm/sDescribe        Arm Pressure (mmHG) +----------+--------+--------+----------------+-------------------+ [id number]             Multiphasic, WNL                    +----------+--------+--------+----------------+-------------------+ +---------+--------+--+--------+--+---------+ VertebralPSV cm/s47EDV cm/s14Antegrade +---------+--------+--+--------+--+---------+   Summary: Right Carotid: Velocities in the right ICA are consistent with a 1-39% stenosis. Left Carotid: Velocities in the left ICA are consistent with a 1-39% stenosis. Vertebrals:  Bilateral vertebral arteries demonstrate antegrade flow. Subclavians: Normal flow hemodynamics were seen in bilateral subclavian              arteries. *See table(s) above for measurements and observations.  Electronically signed by Delia Heady MD on 06/02/2022 at 4:01:09 PM.    Final     Assessment & Plan:   Problem List Items Addressed This Visit     Anxiety disorder    Will continue on Abilify to 5 mg/d. It is helping      Hyperglycemia    Check A1c      Memory disturbance    Better on  Aricept 10 mg/d      Depression with anxiety     On Abilify,  Aricept      History of CVA (cerebrovascular accident)    In PT - balance is better      Hamstring tendon rupture    Pain remains Tramadol prn Use a hamstring sleeve for pain      Other Visit Diagnoses     Need for influenza vaccination    -  Primary   Relevant Orders   Flu Vaccine Trivalent High Dose (Fluad) (Completed)         No orders of the defined types were placed in this encounter.     Follow-up:  Return in about 3 months (around 05/19/2023) for a follow-up visit.  Sonda Primes, MD

## 2023-02-16 NOTE — Assessment & Plan Note (Signed)
In PT - balance is better

## 2023-02-16 NOTE — Assessment & Plan Note (Signed)
  On Abilify,  Aricept

## 2023-02-21 ENCOUNTER — Other Ambulatory Visit: Payer: Self-pay | Admitting: Internal Medicine

## 2023-02-23 ENCOUNTER — Ambulatory Visit: Payer: Medicare Other | Attending: Internal Medicine | Admitting: Physical Therapy

## 2023-02-23 DIAGNOSIS — R2689 Other abnormalities of gait and mobility: Secondary | ICD-10-CM | POA: Diagnosis present

## 2023-02-23 DIAGNOSIS — R2681 Unsteadiness on feet: Secondary | ICD-10-CM | POA: Diagnosis present

## 2023-02-23 DIAGNOSIS — M6281 Muscle weakness (generalized): Secondary | ICD-10-CM | POA: Insufficient documentation

## 2023-02-24 ENCOUNTER — Encounter: Payer: Self-pay | Admitting: Physical Therapy

## 2023-02-24 NOTE — Therapy (Signed)
OUTPATIENT PHYSICAL THERAPY NEURO TREATMENT/AQUATIC THERAPY/RE-CERT/DISCHARGE SUMMARY   Patient Name: RAYKWON HOBBS MRN: [id number] DOB:11-Mar-1951, 72 y.o., male Today's Date: 02/24/2023   PCP: Tresa Garter, MD REFERRING PROVIDER: Tresa Garter, MD  END OF SESSION:  PT End of Session - 02/24/23 1020     Visit Number 17    Number of Visits 17    Date for PT Re-Evaluation 02/18/23    Authorization Type [employer] medicare    PT Start Time 1310    PT Stop Time 1400    PT Time Calculation (min) 50 min    Equipment Utilized During Treatment Other (comment)   bar bells, aquatic cuffs   Activity Tolerance Patient tolerated treatment well    Behavior During Therapy WFL for tasks assessed/performed              Past Medical History:  Diagnosis Date   Allergy    Anxiety    CAD    a. acute inferior lateral wall infarction in September 2004 treated medically. b.  cath 06/17/16 showing mild nonobstructive CAD with 30-40% ostial LM (eccentric), elevated LV filling pressures and normal LV function.   Chronic diastolic CHF (congestive heart failure) (HCC)    CKD (chronic kidney disease), stage II    COPD (chronic obstructive pulmonary disease) (HCC)    Diverticulosis    DJD (degenerative joint disease)    GERD (gastroesophageal reflux disease)    HTN (hypertension)    Hyperlipidemia    Low back pain syndrome    Myocardial infarction (HCC) 2004   Obesity    OSA (obstructive sleep apnea)    RBBB    Recurrent aspiration bronchitis/pneumonia    Recurrent aspiration pneumonia (HCC)    Hattie Perch 07/13/2016   Stroke (HCC)    Thrombocytopenia (HCC)    Tubular adenoma of colon 2014   Past Surgical History:  Procedure Laterality Date   CARDIAC CATHETERIZATION     LEFT HEART CATH AND CORONARY ANGIOGRAPHY N/A 06/17/2016   Procedure: Left Heart Cath and Coronary Angiography;  Surgeon: Kathleene Hazel, MD;  Location: [facility] INVASIVE CV LAB;  Service: Cardiovascular;  Laterality:  N/A;   UVULOPALATOPHARYNGOPLASTY     surgery for OSA   Patient Active Problem List   Diagnosis Date Noted   BPH (benign prostatic hyperplasia) 08/05/2022   Hearing problem 04/06/2022   COVID-19 12/10/2021   Cough 11/26/2021   Fever 11/26/2021   Wheezing 11/26/2021   Hamstring tendon rupture 06/24/2021   Hip pain, chronic, right 02/17/2021   Paresthesias 09/01/2020   Neck pain on left side 02/28/2020   Allergic rhinitis 12/26/2019   History of CVA (cerebrovascular accident) 12/05/2019   Right hemiparesis (HCC) 12/04/2019   Chronic renal insufficiency, stage 3 (moderate) (HCC) 07/05/2019   Mild cognitive disorder 10/12/2017   Viral pneumonitis 07/22/2016   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/2016   Depression with anxiety 07/13/2016   Acute respiratory failure with hypoxia (HCC) 07/13/2016   Chronic diastolic CHF (congestive heart failure) (HCC) 07/13/2016   Sepsis, unspecified organism (HCC) 07/13/2016   Acute pain of right knee 06/30/2016   Fatigue 06/30/2016   Creatinine elevation 06/30/2016   Dyspnea on exertion    Laryngopharyngeal reflux (LPR) 07/07/2015   COPD with acute exacerbation (HCC) 04/04/2015   Elevated IgE level 03/18/2014   Well adult exam 03/05/2014   COPD with chronic bronchitis (HCC) 12/13/2013   Venous insufficiency 11/03/2011   Edema 11/03/2011   Memory disturbance 05/13/2011   CAD (coronary artery disease) 09/17/2010  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   Incidental pulmonary nodule 06/17/2009   COLONIC POLYPS 10/17/2007   BRONCHITIS, RECURRENT 10/17/2007   Diverticulosis of large intestine 10/17/2007   Osteoarthritis 10/17/2007   Hyperglycemia 10/17/2007   Dyslipidemia 10/02/2007   Anxiety disorder 10/02/2007   Obstructive sleep apnea 10/02/2007   Essential hypertension 10/02/2007   LOW BACK PAIN SYNDROME 10/02/2007    ONSET DATE: 11/17/2022  REFERRING DIAG: M54.40,G89.29 (ICD-10-CM) - Chronic midline low back pain with sciatica, sciatica  laterality unspecified G81.91 (ICD-10-CM) - Right hemiparesis (HCC)  THERAPY DIAG:  Unsteadiness on feet - Plan: PT plan of care cert/re-cert  Other abnormalities of gait and mobility - Plan: PT plan of care cert/re-cert  Muscle weakness (generalized) - Plan: PT plan of care cert/re-cert  Rationale for Evaluation and Treatment: Rehabilitation  SUBJECTIVE:                                                                                                                                                                                             SUBJECTIVE STATEMENT: Patient reports he is doing well - says "today is my last day"  Pt accompanied by: self  PERTINENT HISTORY: BPH, h/o CVA, anxiety, GERD, CHF, MI, COPD, HTN, HLD, CKD  PAIN:  Are you having pain?  No    PRECAUTIONS: Fall  PATIENT GOALS: "I need to get my balance and gait back"   OBJECTIVE:   DIAGNOSTIC FINDINGS:  Lumbar mri 11/2020:  IMPRESSION: 1. Lumbar spine spondylosis as described above. 2. No acute osseous injury of the lumbar spine.  R hip MRI IMPRESSION: 1. Complete tear of the hamstring component of the adductor magnus tendon origin from the ischial tuberosity with approximately 2.4 cm retraction with fluid-filled gap. 2. Tendinosis with low-grade partial tearing of the semimembranosus tendon as well as the conjoint tendon of the long head biceps femoris and semitendinosus without a full-thickness or retracted component.   TODAY'S TREATMENT: 02-23-23     Aquatic therapy at [facility] - pool temp 90 degrees  Patient seen for aquatic therapy today.  Treatment took place in water 3.6- 4.5 feet deep depending upon activity.  Pt entered & exited the pool via step negotiation with use of hand rails independently.  Pt performed runner's stretch RLE and LLE 30 sec hold x 1 rep each;  gastroc stretch with bil. Forefeet on pool wall 30 sec hold x 1 rep  Warm up - forwards 18' x 4 reps, backwards and sideways  ambulation 18' x 4 reps each direction  Marching slowly in place with contralateral UE flexion 10 reps each leg; progressed to adding barbells in each hand for  increased resistance 10 reps  Marching forward across pool with barbells 18' x 2 reps - 1-2 sec hold for improved SLS on stance leg; marching backwards 18' x 2 reps with bar bells in each hand for contralateral UE/LE movement  Crossovers front 18' x 2 reps; stepping behind 18' x 2 reps   Sidestepping with squats 18' x 4 reps across pool - with small barbells for increased resistance for increased core stabilization  Pt performed hip flexion, abduction, adduction and extension RLE and LLE with aquatic cuff for increased resistance with eccentric contraction 10 reps each leg each direction  Pt stood on 1 leg - made circles CW and CCW 10 reps each directio with each leg with minimal UE support  Ai Chi posture - "Balancing" x 10 reps with tactile cue for correct UE movement with LE movement; "Freeing" x 10 reps  Pt requires buoyancy of water for support for reduced fall risk and for unloading/reduced stress on joints and spine as pt able to tolerate increased standing and ambulation in water compared to tolerance on land.  Viscosity of water is needed for resistance for strengthening and current of water provides perturbations for challenge for balance training: buoyancy of water needed for spinal decompression for reduced pain with weight bearing activities & exercises in water.                                                                                                                    GOALS: Goals reviewed with patient? Yes  SHORT TERM GOALS: Target date: 01/07/23  Pt will be independent with initial HEP for improved balance and gait mechanics  Baseline: to be provided Goal status: MET  2.  Pt will improve FGA to >/= 23/30 to demonstrate improved balance and reduced fall risk  Baseline: 18/30;  01-04-23 - score 22/30;  26/30 Goal status:  MET  3.  Pt will improve gait speed to >/= 1.24m/s to demonstrate improved community ambulation  Baseline: 1.44m/s;  9.37 secs = 1.07 m/sec ;  2nd trial  7.41 secs = 1.35 m/sec; 1.51m/s Goal status: Goal met 01-04-23  4.  Pt will improve 5x STS to </= 10 sec to demo improved functional LE strength and balance   Baseline: 13.19s; 01-04-23:   12.78 secs without UE support; 12.17s no UE Goal status:  Partially met 01-04-23  5.  Patient will complete >/= 30s on condition 4 of MCTSIB to demonstrate improved balance Baseline:  23s:  30 secs on 01-04-23 Goal status: Goal met    LONG TERM GOALS: Target date: 02/18/23  Pt will be independent with final HEP for improved balance and gait mechanics  Baseline: to be provided Goal status: Goal met 02-08-23  Pt will improve FGA to >/= 23/30 to demonstrate improved balance and reduced fall risk  Baseline: 18/30; score 30/30 Goal status: Goal met 02-08-23  Pt will improve gait speed to >/= 1.58m/s to demonstrate improved community ambulation  Baseline: 1.69m/s; 1.13 m/sec Goal status: Goal partially met  02-08-23  Pt will improve 5x STS to </= 10 sec to demo improved functional LE strength and balance   Baseline: 13.19s  8.56 secs from mat - 02-08-23 Goal status: Goal met  MCTSIB goal Baseline:  not needed Goal status: DEFERRED   ASSESSMENT:  CLINICAL IMPRESSION: Aquatic PT session focused on balance training with focus to improve SLS on each leg.  Pt has met LTG's #1, 2 (FGA score = 30/30), and #4:  LTG #3 partially met as gait velocity = 1.13 m/sec  with goal >/= 1.15 m/sec.  LTG #5 had been deferred as MCTSIB goal not needed.  Pt's gait and balance are WNL's.  D/C due to completion of program and no further needs identified at this time.   OBJECTIVE IMPAIRMENTS: Abnormal gait, cardiopulmonary status limiting activity, decreased activity tolerance, decreased balance, decreased endurance, decreased knowledge of  condition, difficulty walking, decreased strength, impaired flexibility, impaired sensation, postural dysfunction, and pain.   ACTIVITY LIMITATIONS: carrying, lifting, sitting, standing, stairs, locomotion level, and caring for others  PARTICIPATION LIMITATIONS: interpersonal relationship, driving, shopping, community activity, and yard work  PERSONAL FACTORS: Age, Fitness, Past/current experiences, Time since onset of injury/illness/exacerbation, and 3+ comorbidities: see above  are also affecting patient's functional outcome.   REHAB POTENTIAL: Good  CLINICAL DECISION MAKING: Stable/uncomplicated  EVALUATION COMPLEXITY: Low  PLAN:  PT FREQUENCY: 2x/week  PT DURATION: 8 weeks  PLANNED INTERVENTIONS: Therapeutic exercises, Therapeutic activity, Neuromuscular re-education, Balance training, Gait training, Patient/Family education, Self Care, Joint mobilization, Stair training, Vestibular training, Visual/preceptual remediation/compensation, Orthotic/Fit training, DME instructions, Aquatic Therapy, Dry Needling, Electrical stimulation, Cryotherapy, Moist heat, Manual therapy, and Re-evaluation  PLAN FOR NEXT SESSION:  D/C   PHYSICAL THERAPY DISCHARGE SUMMARY  Visits from Start of Care: 17  Current functional level related to goals / functional outcomes: See above for progress towards goals   Remaining deficits: Continued c/o Rt hip pain with prolonged sitting   Education / Equipment: Pt has been instructed in HEP for balance and strengthening.  Pt reports compliance with HEP.    Patient agrees to discharge. Patient goals were met. Patient is being discharged due to meeting the stated rehab goals.    Kary Kos, PT  02/24/2023, 10:36 AM

## 2023-03-30 ENCOUNTER — Ambulatory Visit: Payer: Medicare Other | Admitting: Adult Health

## 2023-03-30 ENCOUNTER — Encounter: Payer: Self-pay | Admitting: Adult Health

## 2023-03-30 VITALS — BP 118/52 | HR 62 | Ht 69.0 in | Wt 195.0 lb

## 2023-03-30 DIAGNOSIS — I6381 Other cerebral infarction due to occlusion or stenosis of small artery: Secondary | ICD-10-CM

## 2023-03-30 DIAGNOSIS — G214 Vascular parkinsonism: Secondary | ICD-10-CM

## 2023-03-30 DIAGNOSIS — G3184 Mild cognitive impairment, so stated: Secondary | ICD-10-CM

## 2023-03-30 MED ORDER — CARBIDOPA-LEVODOPA 25-100 MG PO TABS: 0.5000 | ORAL_TABLET | Freq: Three times a day (TID) | ORAL | 5 refills | Status: DC

## 2023-03-30 NOTE — Patient Instructions (Addendum)
Restart Sinemet 0.5 tab twice daily for 2 weeks then increase to 0.5 tab three times daily - after 1 month at this dosage, please let me know if you feel like the dosage should be increased further (such as if you notice wearing off effects or worsening of symptoms prior to your next dosage OR you are tolerating well but no noticeable benefit)  Please monitor right sided numbness, if this should worsen or you start to have any hand weakness, please let me know. Would recommend having your B12 level checked at follow up visit with your PCP  Monitor your memory, if you notice any gradual worsening and interested in restarting Aricept (doneprezil) or trying Namenda which can both help slow your memory decline, please let me know. Please ensure you are routinely doing memory exercises as well as routine physical activity, ensuring healthy diet and good sleep habits   Continue clopidogrel 75 mg daily  and Crestor  for secondary stroke prevention  Continue to follow up with PCP regarding blood pressure and cholesterol management  Maintain strict control of hypertension with blood pressure goal below 130/90 and cholesterol with LDL cholesterol (bad cholesterol) goal below 70 mg/dL.   Signs of a Stroke? Follow the BEFAST method:  Balance Watch for a sudden loss of balance, trouble with coordination or vertigo Eyes Is there a sudden loss of vision in one or both eyes? Or double vision?  Face: Ask the person to smile. Does one side of the face droop or is it numb?  Arms: Ask the person to raise both arms. Does one arm drift downward? Is there weakness or numbness of a leg? Speech: Ask the person to repeat a simple phrase. Does the speech sound slurred/strange? Is the person confused ? Time: If you observe any of these signs, call 911.     Followup in the future with me in 6 months or call earlier if needed      Thank you for coming to see [place] at [facility]. I hope we have been  able to provide you high quality care today.  You may receive a patient satisfaction survey over the next few weeks. We would appreciate your feedback and comments so that we may continue to improve ourselves and the health of our patients.

## 2023-03-30 NOTE — Progress Notes (Signed)
[facility] [address] [place]. [place] [address] [phone]       OFFICE FOLLOW UP NOTE  Larry Stone Date of Birth:  05-Sep-1950 Medical Record Number:  [id number]  Primary neurologist: Dr. Pearlean Brownie Reason for visit: Stroke, MCI, parkinsonism   Chief Complaint  Patient presents with   Follow-up    Patient in room #8 and alone. Patient states he been having some numbness and tingling in his right hand.       HPI:   Update 03/30/2023 JM: Patient returns for 40-month follow-up unaccompanied.  At prior visit with Dr. 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  He does have chronic right hand and foot paresthesias poststroke but has noticed slight worsening over the past 3-4 months. Previously numbness only affecting first 3 digits but now affecting whole hand, noticed some worsening numbness in foot as well. Denies any weakness or pain. More just bothersome. Can have some numbness in right arm upon awakening but will gradually resolve after being awake for a little while and then will only.be in his hand.  Denies neck or lower back pain.  Does have history of B12 deficiency, previously on supplement but reports B12 level elevated therefore was discontinued (prior B12 level check in 2017 which was satisfactory).  Denies any new stroke/TIA symptoms.  Compliant on Plavix, Zetia and pravastatin.  Routinely follows with PCP for stroke risk factor management.   He is no longer taking Sinemet due to nausea, reports tolerating well while gradually tolerating up to 1 tab BID but then increased up to 4x per day and started to have significant nausea therefore he discontinued.  He did see some improvement of tremors and gait/balance while taking. Reports stopping Abilify and no longer experiencing drooling.  Cognition has remained stable, more issues with short term memory, reports donepezil  10mg  daily caused diarrhea therefore discontinued. Continues to maintain ADLs and IADLs independently as well as driving without difficulty.     History provided for reference purposes only Update 12/22/2022 Dr. Pearlean Brownie: Patient is seen for follow-up today upon his request after last visit 7 months ago.  He is has new complaint of increased drooling on the right side of his mouth.  This is intermittent and can occur during the day or night.  There are no obvious triggers.  He has to use a lot of napkins to wipe his face.  He denies any trouble swallowing or any pain in his teeth or swelling inside his mouth.  He does admit to his voice getting softer difficulty here.  He is also noticed increased difficulty in getting out of a chair and in wearing his clothes.  He has noticed a tremor in his hands particularly in the left 1 last several months.  He denies any stooped posture or walking with feet stuck to the ground.  He states his posture balance is poor and at times tends to fall backwards.  He has started walking with some shuffling.  He denies any recurrent stroke or TIA symptoms.  He remains on Plavix which is tolerating well without bruising or bleeding.  States his blood pressure is under good control.  His sugars have also been quite fine.  He did undergo follow-up carotid ultrasound on 06/02/2022 which showed no significant extracranial stenosis.  He continues to have short-term memory difficulties which are unchanged the long-term memory is good.  He is still on Aricept 5  mg daily which is tolerating well without side effects but he has never increase the dose to 10 mg.  He is still independent actives of daily living.  He has been driving and states he never has gotten lost.  Continues to have mild paresthesias in the right face hands and feet which are intermittent and states she has gotten used to it and is tolerating it well.  Update 05/25/2022 Dr. Pearlean Brownie: He returns for follow-up after last visit in  May 2023.  He states that he has noticed worsening of paresthesias on his face as well as right hand and leg.  These are intermittent and they come and go and can last variably from few minutes to up to an hour.  He has gotten used to them.  He is also noted some decrease in his short-term memory phonation is difficult for him to remember.  His long-term memory is not as bad.  He has never gotten while driving.  He is still independent in all activities of daily living.  His primary care physician gave him a prescription for Aricept 5 mg daily for a month but he has not yet started it.  He has an appointment to see his primary care physician for follow-up lab work dated done today for his lipid profile A1c.  He has been compliant with taking his Plavix which is tolerating well without bruising or bleeding.  His blood pressure is under good control and today it is 135/75.  He is tolerating Pravachol and Zetia well without side effects.  He states his sugars are doing better now.  Update 09/16/2021 JM: Patient returns for 32-month stroke follow-up.  He has been stable from stroke standpoint without new stroke/TIA symptoms.  Reports continued right hand and face dysesthesias. He has greater difficulty with his hand which he feels can fluctuate between feeling cold and hot.  Use of compression glove provides some relief. Continues to have difficulty with right hamstring tear - currently using tramadol which helps pain some but is still limited in activity.  This is managed by PCP.  Compliant on Plavix, pravastatin and Zetia, denies side effects.  Blood pressure today 147/76. Monitors at home and typically 130s/70s.  Routinely follows with PCP and cardiology.  No further concerns at this time  Update 03/03/2021 JM: Returns for 79-month stroke follow-up unaccompanied  Overall stable -denies new stroke/TIA symptoms Continued right hand and face post stroke pain - previously rx'd amitriptyline (for both pain and  anxiety) but did not take due to interaction with bupropion.  Prior intolerance to topiramate and pregabalin.  He does continue to experience anxiety -currently on bupropion and Xanax managed by PCP Has been dealing with right hamstring tear - has been placed on tramadol due to severe pain - questions if tramadol has been helping post stroke pain some or if he hasn't been as focused on post stroke pain due to severe pain of right thigh - being followed by ortho Dr. Roda Shutters  Compliant on Plavix, pravastatin and Zetia -denies side effects Blood pressure today 115/64 Recent A1c 5.8 on metformin 500 mg daily  No new concerns at this time  Update 10/27/2020 JM: Mr. Belville returns for 4 month stroke follow-up unaccompanied.  Stable from stroke standpoint without new stroke/TIA symptoms.  He continues to have difficulty with right face, hand and occasional foot paresthesias describing as burning and electrical shock sensation.  He has previously trialed pregabalin and topiramate but unable to tolerate.  Previously complained worsening gait  difficulty, slurred speech and cognitive impairment after initiating pregabalin which resolved shortly after discontinuing.  He is currently participating in PT for right posterior thigh pain followed by orthopedics Dr. Prince Rome.  He also reports memory changes since his stroke including increased irritability and low tolerance levels - hx of depression and anxiety currently on bupropion and Xanax as needed per PCP. Reports compliance on Plavix, pravastatin and Zetia without associated side effects.  Blood pressure today 134/72.  No further concerns at this time.   Update 06/19/2020 JM: Mr. Collington returns for stroke follow-up after prior visit approximately 5 months ago with Dr. Pearlean Brownie.  Topiramate initiated at prior visit for poststroke paresthesias but unable to tolerate. Placed on Lyrica 50 mg twice daily beginning of November and reports approximately 2 weeks after, he was  noticing increased lethargy and fluctuation of gait impairment with imbalance (getting pulled to the right) and lightheadedness, slurred speech and cognitive difficulties such as delayed responses and short-term memory difficulties.  Gait impairment with imbalance and slurred speech all present initially with stroke.  Denies new stroke/TIA symptoms.   He is unsure if Lyrica was beneficial for paresthesias as he does report some improvement since prior visit but right hand and right facial paresthesias still present. He also admits to struggling with depression with history of depression PTA on bupropion 150 mg twice daily but noticed worsening after his stroke.  He is also on Xanax as needed per PCP He has been trying to keep active and does report doing HEP daily as advised during therapy sessions  Reports compliance on Plavix and pravastatin and Zetia-denies side effects Blood pressure today 135/66  No further concerns at this time  Initial visit 02/06/2020 Dr. Pearlean Brownie: Mr. Crawl is a pleasant 72 year old Caucasian male with past medical history of hypertension, hyper lipidemia, diabetes, congestive heart failure and coronary artery disease who developed sudden onset of right lower face and hand numbness as well as imbalance and difficulty walking on 12/03/2019.  He did not seek medical help the same day as if waited for it to get better.  Next day he went to the hospital but got tired waiting in the ER since he was not seen and instead went to see his primary physician Dr. Posey Rea who ordered an outpatient stroke work-up.  MRI scan of the brain done on 12/20/2019 shows a subacute left thalamic lacunar infarct.  Carotid ultrasound on 12/10/2019 showed no significant extracranial stenosis.  Echocardiogram on 12/28/2018 showed slightly diminished ejection fraction of 45 to 50% but no clot or definite cardiac source of embolism was noted.  He underwent 30 days of outpatient telemetry cardiac monitoring which was  negative for atrial fibrillation or significant arrhythmias.  Carotid ultrasound on 12/10/2019 was unremarkable.  LDL cholesterol was 78 mg percent on 12/26/2019.  Patient is presently on Plavix 75 mg daily which is tolerating well without bruising or bleeding.  He states his gait and balance have improved and he has finished outpatient physical and occupational therapy.  He still stumbles occasionally but as long to walk slowly and carefully.  He has had no falls or injuries.  He however still has persistent paresthesias in his right face and right hand which are bothersome and he would like to try some medications for this.  He has had no recurrent stroke or TIA symptoms.  States his blood pressure is well controlled and today it is 113/65 in office.  States his sugars are also doing well however he cannot tell me  when his last hemoglobin A1c was checked.  He has been scheduled to undergo loop recorder pending my consultation visit today.  ROS:   14 system review of systems is positive for those listed in HPI and all other systems negative  PMH:  Past Medical History:  Diagnosis Date   Allergy    Anxiety    CAD    a. acute inferior lateral wall infarction in September 2004 treated medically. b.  cath 06/17/16 showing mild nonobstructive CAD with 30-40% ostial LM (eccentric), elevated LV filling pressures and normal LV function.   Chronic diastolic CHF (congestive heart failure) (HCC)    CKD (chronic kidney disease), stage II    COPD (chronic obstructive pulmonary disease) (HCC)    Diverticulosis    DJD (degenerative joint disease)    GERD (gastroesophageal reflux disease)    HTN (hypertension)    Hyperlipidemia    Low back pain syndrome    Myocardial infarction (HCC) 2004   Obesity    OSA (obstructive sleep apnea)    RBBB    Recurrent aspiration bronchitis/pneumonia    Recurrent aspiration pneumonia (HCC)    Hattie Perch 07/13/2016   Stroke (HCC)    Thrombocytopenia (HCC)    Tubular adenoma of  colon 2014    Social History:  Social History   Socioeconomic History   Marital status: Married    Spouse name: Not on file   Number of children: 1   Years of education: Not on file   Highest education level: Not on file  Occupational History   Occupation: Copywriter, advertising of [employer]  Tobacco Use   Smoking status: Former    Current packs/day: 0.00    Average packs/day: 1 pack/day for 20.0 years (20.0 ttl pk-yrs)    Types: Cigarettes, Cigars    Start date: 05/08/1978    Quit date: 05/08/1998    Years since quitting: 24.9   Smokeless tobacco: Never   Tobacco comments:    quit in 2005  Vaping Use   Vaping status: Never Used  Substance and Sexual Activity   Alcohol use: Yes    Alcohol/week: 1.0 standard drink of alcohol    Types: 1 Standard drinks or equivalent per week    Comment: rarely   Drug use: No   Sexual activity: Yes  Other Topics Concern   Not on file  Social History Narrative   Lives with spouse only   Right Handed   Drinks <1 cup caffeine daily   Social Determinants of Health   Financial Resource Strain: Patient Declined (02/13/2023)   Overall Financial Resource Strain (CARDIA)    Difficulty of Paying Living Expenses: Patient declined  Food Insecurity: No Food Insecurity (02/13/2023)   Hunger Vital Sign    Worried About Running Out of Food in the Last Year: Never true    Ran Out of Food in the Last Year: Never true  Transportation Needs: No Transportation Needs (02/13/2023)   PRAPARE - Administrator, Civil Service (Medical): No    Lack of Transportation (Non-Medical): No  Physical Activity: Insufficiently Active (02/13/2023)   Exercise Vital Sign    Days of Exercise per Week: 3 days    Minutes of Exercise per Session: 30 min  Stress: Stress Concern Present (02/13/2023)   [employer] of Occupational Health - Occupational Stress Questionnaire    Feeling of Stress : To some extent  Social Connections: Moderately Isolated  (02/13/2023)   Social Connection and Isolation Panel [NHANES]    Frequency of  Communication with Friends and Family: Three times a week    Frequency of Social Gatherings with Friends and Family: Once a week    Attends Religious Services: Never    Database administrator or Organizations: No    Attends Banker Meetings: Patient declined    Marital Status: Married  Catering manager Violence: Not At Risk (05/06/2022)   Humiliation, Afraid, Rape, and Kick questionnaire    Fear of Current or Ex-Partner: No    Emotionally Abused: No    Physically Abused: No    Sexually Abused: No    Medications:   Current Outpatient Medications on File Prior to Visit  Medication Sig Dispense Refill   ALPRAZolam (XANAX) 0.5 MG tablet TAKE 1 TABLET BY MOUTH THREE TIMES A DAY AS NEEDED 270 tablet 1   ammonium lactate (LAC-HYDRIN) 12 % lotion Apply topically as directed. 400 mL 3   buPROPion (WELLBUTRIN SR) 150 MG 12 hr tablet TAKE 1 TABLET BY MOUTH TWICE A DAY 180 tablet 3   carvedilol (COREG) 25 MG tablet TAKE 1 TABLET BY MOUTH 2 TIMES DAILY WITH A MEAL. 180 tablet 3   clopidogrel (PLAVIX) 75 MG tablet TAKE 1 TABLET BY MOUTH EVERY DAY 90 tablet 3   clotrimazole-betamethasone (LOTRISONE) cream APPLY AS DIRECTED 45 g 11   ezetimibe (ZETIA) 10 MG tablet TAKE 1 TABLET BY MOUTH EVERY DAY 90 tablet 3   Fluticasone-Umeclidin-Vilant (TRELEGY ELLIPTA) 100-62.5-25 MCG/ACT AEPB Inhale 1 puff into the lungs daily. (Patient taking differently: Inhale 1 puff into the lungs as needed.) 1 each 11   hydrALAZINE (APRESOLINE) 50 MG tablet TAKE 1 TABLET BY MOUTH FOUR TIMES A DAY 360 tablet 3   ipratropium-albuterol (DUONEB) 0.5-2.5 (3) MG/3ML SOLN Take 3 mLs by nebulization every 4 (four) hours as needed (every 4-6 hours prn). Take by nebulization every 4-6 hours as needed 360 mL 2   meclizine (ANTIVERT) 25 MG tablet Take 0.5 tablets (12.5 mg total) by mouth 3 (three) times daily as needed for dizziness. 30 tablet 0    metFORMIN (GLUCOPHAGE) 500 MG tablet TAKE 1 TABLET BY MOUTH EVERY DAY WITH BREAKFAST 90 tablet 1   montelukast (SINGULAIR) 10 MG tablet TAKE 1 TABLET BY MOUTH EVERYDAY AT BEDTIME 90 tablet 3   nitroGLYCERIN (NITROSTAT) 0.4 MG SL tablet PLACE 1 TABLET UNDER THE TONGUE EVERY 5 MINUTES AS NEEDED FOR CHEST PAIN. UP TO 3 DOSES. 75 tablet 1   pravastatin (PRAVACHOL) 40 MG tablet TAKE 1 TABLET BY MOUTH EVERY DAY 90 tablet 3   tamsulosin (FLOMAX) 0.4 MG CAPS capsule Take 1 capsule (0.4 mg total) by mouth daily. 30 capsule 11   traMADol (ULTRAM) 50 MG tablet Take 1 tablet (50 mg total) by mouth every 6 (six) hours as needed. 120 tablet 1   No current facility-administered medications on file prior to visit.    Allergies:   Allergies  Allergen Reactions   Ace Inhibitors Swelling   Angiotensin Receptor Blockers Swelling   Aspirin Hives   Amlodipine     Leg swelling   Carbidopa-Levodopa     nausea   Crestor [Rosuvastatin Calcium]     myalgias   Cymbalta [Duloxetine Hcl]     Dizzy    Lipitor [Atorvastatin Calcium]     arthralgias   Codeine Nausea And Vomiting   Irbesartan Other (See Comments)    REACTION: allergic to ARBs w/ angioedema   Ramipril Other (See Comments)    REACTION: Allergic to ACE's w/ angioedema.Marland KitchenMarland Kitchen  Physical Exam Today's Vitals   03/30/23 1359  BP: (!) 118/52  Pulse: 62  Weight: 195 lb (88.5 kg)  Height: 5\' 9"  (1.753 m)   Body mass index is 28.8 kg/m.   General: well developed, well nourished pleasant elderly Caucasian male, seated, in no evident distress  Neurologic Exam Mental Status: Awake and fully alert.  Fluent speech and language.  Mild hypophonia.  Oriented to place and time. Recent and remote memory intact. Attention span, concentration and fund of knowledge appropriate during visit. Mood and affect appropriate.   Cranial Nerves: Pupils equal, briskly reactive to light. Extraocular movements full without nystagmus. Visual fields full to confrontation.  Hearing diminished despite hearing aid. Facial sensation intact.  Mild right nasolabial fold flattening.  Tongue, palate moves normally and symmetrically.  Motor: Normal bulk and tone. Normal strength in all tested extremity muscles.  Mild pill-rolling resting tremor of the left hand.  Mild tremor left greater than right of outstretched hands bilaterally. Decreased fine motor control L>R UE.  No cogwheel rigidity. Sensory.: intact to touch , pinprick , position and vibratory sensation -subjective paresthesias in the right face and foot, "odd feeling" with light touch over right hand Gait and Station: Arises from chair without difficulty. Stance is slightly hunched. Gait demonstrates normal stride length and balance without use of assistive device.  Diminished left arm swing while walking.   No festination or shuffling feet Reflexes: 1+ and symmetric. Toes downgoing.       ASSESSMENT/PLAN: 72 year old Caucasian male with left thalamic stroke in August 2021 from small vessel disease with residual right face and hand dysesthesias which appear to be bothersome now..  Vascular risk factors of hypertension hyperlipidemia coronary artery disease.  He also has mild memory loss likely due to mild cognitive impairment. 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  Post stroke parkinsonism  -Restart Sinemet 25/100 0.5 tab BID for 2 weeks then increase to 0.5 tab TID, can further increase as needed. Difficulty tolerating 1 tab 4 times daily but suspect due to rapid increase of dosage  -continue to stay active with routine physical activity and exercise   2.  MCI  -subjectively stable  -Donepezil 10 mg daily as GI side effects, tolerated 5mg  dosing well but declines interest in restarting as he has been stable  -can also consider initiating memantine in the future if needed   3. Hx of stroke  -reports some worsening of right hand and foot  paresthesias, no weakness or pain. Suspect more in setting of post stroke but offered EMG/NCV to rule out compressive etiology but declines interest at this time. He will call if interested in pursuing in the future. Has tried/failed multiple medications previously, will hold off any trying any additional medications as symptoms currently not painful. Discussed checking lab work such as B12 but wishes to wait until f/u with PCP, TSH and A1c 01/2023 satisfactory  -Continue clopidogrel and pravastatin for secondary stroke prevention measures managed/prescribed by PCP  -Continue close PCP follow-up for aggressive stroke risk factor management    Follow-up in 6 months or call earlier if needed      I spent 45 minutes of face-to-face and non-face-to-face time with patient.  This included previsit chart review, lab review, study review, order entry, electronic health record documentation, patient education and discussion regarding above diagnoses and treatment plan and answered all other questions to patient's satisfaction  Ihor Austin, AGNP-BC  [facility] [address] [place], [place] [address]  Phone [phone] Fax [phone] Note: This document was prepared with digital dictation and possible smart phrase technology. Any transcriptional errors that result from this process are unintentional.

## 2023-04-21 ENCOUNTER — Other Ambulatory Visit: Payer: Self-pay | Admitting: Internal Medicine

## 2023-04-28 ENCOUNTER — Other Ambulatory Visit: Payer: Self-pay | Admitting: Internal Medicine

## 2023-04-28 ENCOUNTER — Other Ambulatory Visit: Payer: Self-pay | Admitting: Cardiovascular Disease

## 2023-05-12 ENCOUNTER — Ambulatory Visit: Payer: Medicare Other | Admitting: Podiatry

## 2023-05-12 DIAGNOSIS — B351 Tinea unguium: Secondary | ICD-10-CM

## 2023-05-12 DIAGNOSIS — M79674 Pain in right toe(s): Secondary | ICD-10-CM

## 2023-05-12 DIAGNOSIS — M79675 Pain in left toe(s): Secondary | ICD-10-CM | POA: Diagnosis not present

## 2023-05-12 NOTE — Progress Notes (Signed)
This patient presents to the office with chief complaint of long thick painful nails.  Patient says the nails are painful walking and wearing shoes.  This patient is unable to self treat.  This patient is unable to trim his nails since he is unable to reach his  nails.  She presents to the office for preventative foot care services.  General Appearance  Alert, conversant and in no acute stress.  Vascular  Dorsalis pedis and posterior tibial  pulses are palpable  bilaterally.  Capillary return is within normal limits  bilaterally. Temperature is within normal limits  bilaterally.  Neurologic  Senn-Weinstein monofilament wire test within normal limits  bilaterally. Muscle power within normal limits bilaterally.  Nails Thick disfigured discolored nails with subungual debris  from hallux to fifth toes bilaterally. No evidence of bacterial infection or drainage bilaterally.  Orthopedic  No limitations of motion  feet .  No crepitus or effusions noted.  No bony pathology or digital deformities noted.  Skin  normotropic skin with no porokeratosis noted bilaterally.  No signs of infections or ulcers noted.     Onychomycosis  Nails  B/L.  Pain in right toes  Pain in left toes  Debridement of nails both feet followed trimming the nails with dremel tool.    RTC 3 months.   Gardiner Barefoot DPM

## 2023-05-16 ENCOUNTER — Ambulatory Visit (INDEPENDENT_AMBULATORY_CARE_PROVIDER_SITE_OTHER): Payer: Medicare Other

## 2023-05-16 VITALS — Ht 69.0 in | Wt 195.0 lb

## 2023-05-16 DIAGNOSIS — Z0000 Encounter for general adult medical examination without abnormal findings: Secondary | ICD-10-CM | POA: Diagnosis not present

## 2023-05-16 NOTE — Progress Notes (Addendum)
Subjective:   Larry Stone is a 73 y.o. male who presents for Medicare Annual/Subsequent preventive examination.  Visit Complete: Virtual I connected with  [name] on 05/16/23 by a audio enabled telemedicine application and verified that I am speaking with the correct person using two identifiers.  Patient Location: Home  Provider Location: Office/Clinic  I discussed the limitations of evaluation and management by telemedicine. The patient expressed understanding and agreed to proceed.  Vital Signs: Because this visit was a virtual/telehealth visit, some criteria may be missing or patient reported. Any vitals not documented were not able to be obtained and vitals that have been documented are patient reported.  Patient Medicare AWV questionnaire was completed by the patient on 05/12/2023; I have confirmed that all information answered by patient is correct and no changes since this date.  Cardiac Risk Factors include: advanced age (>56men, >45 women);hypertension;male gender;dyslipidemia;Other (see comment), Risk factor comments: CAD, COPD, CHF, BPH     Objective:    Today's Vitals   05/16/23 0807  Weight: 195 lb (88.5 kg)  Height: 5\' 9"  (1.753 m)   Body mass index is 28.8 kg/m.     05/16/2023    8:18 AM 12/07/2022    1:16 PM 05/06/2022   11:35 AM 04/07/2021   11:33 AM 08/19/2020    2:48 PM 02/28/2020    3:20 PM 12/07/2019   10:50 AM  Advanced Directives  Does Patient Have a Medical Advance Directive? Yes Yes Yes Yes Yes Yes No  Type of Estate agent of [place];Living will  Healthcare Power of [employer];Living will Living will;Healthcare Power of Attorney  Living will;Healthcare Power of Attorney   Does patient want to make changes to medical advance directive?    No - Patient declined No - Patient declined No - Patient declined   Copy of Healthcare Power of Attorney in Chart? No - copy requested  No - copy requested No - copy requested  No - copy  requested   Would patient like information on creating a medical advance directive?       No - Patient declined    Current Medications (verified) Outpatient Encounter Medications as of 05/16/2023  Medication Sig   ALPRAZolam (XANAX) 0.5 MG tablet TAKE 1 TABLET BY MOUTH THREE TIMES A DAY AS NEEDED   ammonium lactate (LAC-HYDRIN) 12 % lotion Apply topically as directed.   buPROPion (WELLBUTRIN SR) 150 MG 12 hr tablet TAKE 1 TABLET BY MOUTH TWICE A DAY   carbidopa-levodopa (SINEMET IR) 25-100 MG tablet Take 0.5 tablets by mouth 3 (three) times daily. Start 0.5 tabs BID for 2 weeks then increase to 0.5 tabs TID   carvedilol (COREG) 25 MG tablet TAKE 1 TABLET BY MOUTH TWICE A DAY WITH FOOD   clopidogrel (PLAVIX) 75 MG tablet TAKE 1 TABLET BY MOUTH EVERY DAY   clotrimazole-betamethasone (LOTRISONE) cream APPLY AS DIRECTED   ezetimibe (ZETIA) 10 MG tablet Take 1 tablet (10 mg total) by mouth daily. Please keep scheduled appointment for future refills. Thank you.   FLUoxetine (PROZAC) 10 MG tablet TAKE 1 TABLET BY MOUTH EVERY DAY   Fluticasone-Umeclidin-Vilant (TRELEGY ELLIPTA) 100-62.5-25 MCG/ACT AEPB Inhale 1 puff into the lungs daily. (Patient taking differently: Inhale 1 puff into the lungs as needed.)   hydrALAZINE (APRESOLINE) 50 MG tablet TAKE 1 TABLET BY MOUTH FOUR TIMES A DAY   ipratropium-albuterol (DUONEB) 0.5-2.5 (3) MG/3ML SOLN Take 3 mLs by nebulization every 4 (four) hours as needed (every 4-6 hours prn).  Take by nebulization every 4-6 hours as needed   meclizine (ANTIVERT) 25 MG tablet Take 0.5 tablets (12.5 mg total) by mouth 3 (three) times daily as needed for dizziness.   metFORMIN (GLUCOPHAGE) 500 MG tablet TAKE 1 TABLET BY MOUTH EVERY DAY WITH BREAKFAST   montelukast (SINGULAIR) 10 MG tablet TAKE 1 TABLET BY MOUTH EVERYDAY AT BEDTIME   nitroGLYCERIN (NITROSTAT) 0.4 MG SL tablet PLACE 1 TABLET UNDER THE TONGUE EVERY 5 MINUTES AS NEEDED FOR CHEST PAIN. UP TO 3 DOSES.   pravastatin  (PRAVACHOL) 40 MG tablet TAKE 1 TABLET BY MOUTH EVERY DAY   tamsulosin (FLOMAX) 0.4 MG CAPS capsule TAKE 1 CAPSULE BY MOUTH EVERY DAY   traMADol (ULTRAM) 50 MG tablet Take 1 tablet (50 mg total) by mouth every 6 (six) hours as needed.   No facility-administered encounter medications on file as of 05/16/2023.    Allergies (verified) Ace inhibitors, Angiotensin receptor blockers, Aspirin, Amlodipine, Crestor [rosuvastatin calcium], Cymbalta [duloxetine hcl], Lipitor [atorvastatin calcium], Codeine, Irbesartan, and Ramipril   History: Past Medical History:  Diagnosis Date   Allergy    Anxiety    CAD    a. acute inferior lateral wall infarction in September 2004 treated medically. b.  cath 06/17/16 showing mild nonobstructive CAD with 30-40% ostial LM (eccentric), elevated LV filling pressures and normal LV function.   Chronic diastolic CHF (congestive heart failure) (HCC)    CKD (chronic kidney disease), stage II    COPD (chronic obstructive pulmonary disease) (HCC)    Diverticulosis    DJD (degenerative joint disease)    GERD (gastroesophageal reflux disease)    HTN (hypertension)    Hyperlipidemia    Low back pain syndrome    Myocardial infarction (HCC) 2004   Obesity    OSA (obstructive sleep apnea)    RBBB    Recurrent aspiration bronchitis/pneumonia    Recurrent aspiration pneumonia (HCC)    Hattie Perch 07/13/2016   Stroke (HCC)    Thrombocytopenia (HCC)    Tubular adenoma of colon 2014   Past Surgical History:  Procedure Laterality Date   CARDIAC CATHETERIZATION     LEFT HEART CATH AND CORONARY ANGIOGRAPHY N/A 06/17/2016   Procedure: Left Heart Cath and Coronary Angiography;  Surgeon: Kathleene Hazel, MD;  Location: [facility] INVASIVE CV LAB;  Service: Cardiovascular;  Laterality: N/A;   UVULOPALATOPHARYNGOPLASTY     surgery for OSA   Family History  Problem Relation Age of Onset   Stroke Mother    Heart disease Mother    Colon cancer Father    Hypertension Sister     Diabetes Sister    Colon cancer Paternal Uncle    Stomach cancer Neg Hx    Esophageal cancer Neg Hx    Pancreatic cancer Neg Hx    Social History   Socioeconomic History   Marital status: Married    Spouse name: Mary   Number of children: 1   Years of education: Not on file   Highest education level: Some college, no degree  Occupational History   Occupation: Copywriter, advertising of [employer]  Tobacco Use   Smoking status: Former    Current packs/day: 0.00    Average packs/day: 1 pack/day for 20.0 years (20.0 ttl pk-yrs)    Types: Cigarettes, Cigars    Start date: 05/08/1978    Quit date: 05/08/1998    Years since quitting: 25.0   Smokeless tobacco: Never   Tobacco comments:    quit in 2005  Vaping Use   Vaping  status: Never Used  Substance and Sexual Activity   Alcohol use: Yes    Alcohol/week: 1.0 standard drink of alcohol    Types: 1 Standard drinks or equivalent per week    Comment: rarely   Drug use: No   Sexual activity: Yes  Other Topics Concern   Not on file  Social History Narrative   Lives with spouse only   Right Handed   Drinks <1 cup caffeine daily   Social Drivers of Health   Financial Resource Strain: Low Risk  (05/12/2023)   Overall Financial Resource Strain (CARDIA)    Difficulty of Paying Living Expenses: Not very hard  Food Insecurity: No Food Insecurity (05/12/2023)   Hunger Vital Sign    Worried About Running Out of Food in the Last Year: Never true    Ran Out of Food in the Last Year: Never true  Transportation Needs: No Transportation Needs (05/12/2023)   PRAPARE - Administrator, Civil Service (Medical): No    Lack of Transportation (Non-Medical): No  Physical Activity: Inactive (05/12/2023)   Exercise Vital Sign    Days of Exercise per Week: 0 days    Minutes of Exercise per Session: 30 min  Stress: Stress Concern Present (05/12/2023)   [employer] of Occupational Health - Occupational Stress Questionnaire    Feeling  of Stress : To some extent  Social Connections: Moderately Isolated (05/12/2023)   Social Connection and Isolation Panel [NHANES]    Frequency of Communication with Friends and Family: Twice a week    Frequency of Social Gatherings with Friends and Family: Once a week    Attends Religious Services: Never    Database administrator or Organizations: No    Attends Engineer, structural: Not on file    Marital Status: Married    Tobacco Counseling Counseling given: Not Answered Tobacco comments: quit in 2005   Clinical Intake:  Pre-visit preparation completed: Yes  Pain : No/denies pain     BMI - recorded: 28.8 Nutritional Status: BMI 25 -29 Overweight Nutritional Risks: None Diabetes: No  How often do you need to have someone help you when you read instructions, pamphlets, or other written materials from your doctor or pharmacy?: 1 - Never     Information entered by :: Tanisia Yokley, RMA   Activities of Daily Living    05/12/2023   10:17 AM  In your present state of health, do you have any difficulty performing the following activities:  Hearing? 1  Vision? 0  Difficulty concentrating or making decisions? 1  Walking or climbing stairs? 1  Dressing or bathing? 0  Doing errands, shopping? 0  Preparing Food and eating ? N  Using the Toilet? N  In the past six months, have you accidently leaked urine? Y  Do you have problems with loss of bowel control? N  Managing your Medications? N  Managing your Finances? N  Housekeeping or managing your Housekeeping? Y    Patient Care Team: Plotnikov, Georgina Quint, MD as PCP - General (Internal Medicine) Kathleene Hazel, MD as PCP - Cardiology (Cardiology) Kathleene Hazel, MD as Consulting Physician (Cardiology) Myrtie Neither Andreas Blower, MD as Consulting Physician (Gastroenterology) Micki Riley, MD as Consulting Physician (Neurology) Maxie Barb, MD as Consulting Physician (Nephrology) Pa,  [facility] as Consulting Physician (Optometry)  Indicate any recent Medical Services you may have received from other than [facility] providers in the past year (date may be approximate).  Assessment:   This is a routine wellness examination for Larry Stone.  Hearing/Vision screen Hearing Screening - Comments:: hEARING PROBLEMS/Wears hearing aids Vision Screening - Comments:: Wears eyeglasses   Goals Addressed             This Visit's Progress    My goal for 2024 is to maintain my health and stay alive.   On track     Depression Screen    05/16/2023    8:20 AM 02/16/2023   11:12 AM 11/17/2022   11:21 AM 05/27/2022   10:52 AM 05/27/2022   10:51 AM 04/06/2022   11:24 AM 10/01/2021    1:11 PM  PHQ 2/9 Scores  PHQ - 2 Score 0 0 0 0 0 0 1  PHQ- 9 Score 0 0 0 0   7    Fall Risk    05/12/2023   10:17 AM 02/16/2023   11:12 AM 05/27/2022   10:51 AM 05/06/2022   11:36 AM 04/06/2022   11:23 AM  Fall Risk   Falls in the past year? 1 0 1 1 1   Number falls in past yr: 0 0 1 1 1   Injury with Fall? 0 0 0 0 0  Risk for fall due to :  No Fall Risks History of fall(s);Impaired balance/gait History of fall(s) History of fall(s)  Follow up Falls evaluation completed;Falls prevention discussed Falls evaluation completed Falls evaluation completed Falls evaluation completed Falls evaluation completed    MEDICARE RISK AT HOME: Medicare Risk at Home Any stairs in or around the home?: (Patient-Rptd) Yes If so, are there any without handrails?: (Patient-Rptd) Yes Home free of loose throw rugs in walkways, pet beds, electrical cords, etc?: (Patient-Rptd) Yes Adequate lighting in your home to reduce risk of falls?: (Patient-Rptd) Yes Life alert?: (Patient-Rptd) Yes Use of a cane, walker or w/c?: (Patient-Rptd) Yes Grab bars in the bathroom?: (Patient-Rptd) Yes Shower chair or bench in shower?: (Patient-Rptd) Yes Elevated toilet seat or a handicapped toilet?: (Patient-Rptd) Yes  TIMED UP AND  GO:  Was the test performed?  No    Cognitive Function:    05/25/2022    8:47 AM  MMSE - Mini Mental State Exam  Orientation to time 5  Orientation to Place 5  Registration 3  Attention/ Calculation 5  Recall 2  Language- name 2 objects 2  Language- repeat 1  Language- follow 3 step command 3  Language- read & follow direction 1  Write a sentence 1  Copy design 0  Total score 28        05/16/2023    8:10 AM 05/06/2022   11:45 AM 02/28/2020    3:22 PM  6CIT Screen  What Year? 0 points 0 points 0 points  What month? 0 points 0 points 0 points  What time? 0 points 0 points 0 points  Count back from 20 0 points 0 points 0 points  Months in reverse 0 points 0 points 0 points  Repeat phrase 2 points 0 points 0 points  Total Score 2 points 0 points 0 points    Immunizations Immunization History  Administered Date(s) Administered   Fluad Quad(high Dose 65+) 12/11/2018, 12/18/2021   Fluad Trivalent(High Dose 65+) 02/16/2023   Influenza Split 02/02/2011, 03/03/2012   Influenza Whole 02/02/2010   Influenza, High Dose Seasonal PF 02/20/2013, 01/16/2015, 12/16/2015, 12/22/2016, 01/04/2018, 01/08/2020, 01/21/2021   Influenza, Seasonal, Injecte, Preservative Fre 02/21/2014   Influenza-Unspecified 01/08/2020   Moderna Sars-Covid-2 Vaccination 05/24/2019, 06/22/2019, 01/15/2020   PNEUMOCOCCAL CONJUGATE-20 01/21/2021  Pneumococcal Conjugate-13 03/05/2014, 12/26/2019   Pneumococcal Polysaccharide-23 06/18/2010, 09/03/2015   Respiratory Syncytial Virus Vaccine,Recomb Aduvanted(Arexvy) 12/18/2021   Tdap 11/14/2012, 03/01/2023   Zoster Recombinant(Shingrix) 12/22/2016, 03/14/2017   Zoster, Live 09/11/2012    TDAP status: Up to date  Flu Vaccine status: Up to date  Pneumococcal vaccine status: Up to date  Covid-19 vaccine status: Declined, Education has been provided regarding the importance of this vaccine but patient still declined. Advised may receive this vaccine at local  pharmacy or Health Dept.or vaccine clinic. Aware to provide a copy of the vaccination record if obtained from local pharmacy or Health Dept. Verbalized acceptance and understanding.  Qualifies for Shingles Vaccine? Yes   Zostavax completed Yes   Shingrix Completed?: Yes  Screening Tests Health Maintenance  Topic Date Due   COVID-19 Vaccine (4 - 2024-25 season) 12/19/2022   Medicare Annual Wellness (AWV)  05/15/2024   Colonoscopy  08/12/2026   DTaP/Tdap/Td (3 - Td or Tdap) 02/28/2033   Pneumonia Vaccine 10+ Years old  Completed   INFLUENZA VACCINE  Completed   Hepatitis C Screening  Completed   Zoster Vaccines- Shingrix  Completed   HPV VACCINES  Aged Out    Health Maintenance  Health Maintenance Due  Topic Date Due   COVID-19 Vaccine (4 - 2024-25 season) 12/19/2022    Colorectal cancer screening: Type of screening: Colonoscopy. Completed 08/11/2021. Repeat every 5 years  Lung Cancer Screening: (Low Dose CT Chest recommended if Age 64-80 years, 20 pack-year currently smoking OR have quit w/in 15years.) does not qualify.   Lung Cancer Screening Referral: N/A  Additional Screening:  Hepatitis C Screening: does qualify; Completed 08/20/2016  Vision Screening: Recommended annual ophthalmology exams for early detection of glaucoma and other disorders of the eye. Is the patient up to date with their annual eye exam?  Yes  Who is the provider or what is the name of the office in which the patient attends annual eye exams? [facility] If pt is not established with a provider, would they like to be referred to a provider to establish care? No .   Dental Screening: Recommended annual dental exams for proper oral hygiene   Community Resource Referral / Chronic Care Management: CRR required this visit?  No   CCM required this visit?  No     Plan:     I have personally reviewed and noted the following in the patient's chart:   Medical and social history Use of  alcohol, tobacco or illicit drugs  Current medications and supplements including opioid prescriptions. Patient is not currently taking opioid prescriptions. Functional ability and status Nutritional status Physical activity Advanced directives List of other physicians Hospitalizations, surgeries, and ER visits in previous 12 months Vitals Screenings to include cognitive, depression, and falls Referrals and appointments  In addition, I have reviewed and discussed with patient certain preventive protocols, quality metrics, and best practice recommendations. A written personalized care plan for preventive services as well as general preventive health recommendations were provided to patient.     Azilee Pirro L Regnia Mathwig, CMA   05/16/2023   After Visit Summary: (MyChart) Due to this being a telephonic visit, the after visit summary with patients personalized plan was offered to patient via MyChart   Nurse Notes: Patient is up to date on all health maintenance with no concerns to address today.  Medical screening examination/treatment/procedure(s) were performed by non-physician practitioner and as supervising physician I was immediately available for consultation/collaboration.  I agree with above. Aleksei Plotnikov,  MD

## 2023-05-16 NOTE — Patient Instructions (Signed)
[name] , Thank you for taking time to come for your Medicare Wellness Visit. I appreciate your ongoing commitment to your health goals. Please review the following plan we discussed and let me know if I can assist you in the future.   Referrals/Orders/Follow-Ups/Clinician Recommendations: It was nice talking to you today. Each day, aim for 6 glasses of water, plenty of protein in your diet and try to get up and walk/ stretch every hour for 5-10 minutes at a time.    This is a list of the screening recommended for you and due dates:  Health Maintenance  Topic Date Due   DTaP/Tdap/Td vaccine (2 - Td or Tdap) 11/15/2022   COVID-19 Vaccine (4 - 2024-25 season) 12/19/2022   Medicare Annual Wellness Visit  05/15/2024   Colon Cancer Screening  08/12/2026   Pneumonia Vaccine  Completed   Flu Shot  Completed   Hepatitis C Screening  Completed   Zoster (Shingles) Vaccine  Completed   HPV Vaccine  Aged Out    Advanced directives: (Copy Requested) Please bring a copy of your health care power of attorney and living will to the office to be added to your chart at your convenience.  Next Medicare Annual Wellness Visit scheduled for next year: Yes

## 2023-05-17 NOTE — Progress Notes (Signed)
 Cardiology Office Note:  .   Date:  05/30/2023  ID:  Larry Stone, DOB 1951/02/28, MRN [id number] PCP: Genia Kettering, MD  [facility] Providers Cardiologist:  Antoinette Batman, MD    History of Present Illness: .   Larry Stone is a 73 y.o. male  with history of CAD inferolateral wall MI in September 2004 treated medically and mild to moderate left main stenosis at that time. Stress myoview  in June 2015 with inferolateral scar and no ischemia Cardiac cath on 06/17/16 with 40% left main stenosis, mild disease in the diagonal and proximal circumflex.  HLD, HTN, OSA and chronic diastolic CHF He has history of myalgias with Lipitor and Crestor so he has been on Pravastatin . He is allergic to Ace-inhibitors and ARBs and has has lower extremity edema with Norvasc . He was started on hydralazine  in May of 2020. We have discussed Repatha but he has not wished to start due to cost.   He had a stroke mid August 2021. He left the ED waiting area before being seen by a physician. EKG with sinus that night. Dr. Georgia Kipper saw him in the office and arrange a head CT and brain MRI which confirmed the stroke. Carotid artery dopplers August 2021 with mild bilateral carotid artery disease. Echo September 2021 with LVEF=45-50%, mild LVH, no valve disease. Cardiac monitor October 2021 with sinus, PVCs.   Patient here for yearly f/u. Denies chest pain, dyspnea, palpitations, edema. No regular exercise other than yoga. Used to walk a lot but disconnected a tendon in his hip.   ROS:    Studies Reviewed: Aaron Aas    EKG Interpretation Date/Time:  Monday May 30 2023 11:33:38 EST Ventricular Rate:  57 PR Interval:  224 QRS Duration:  130 QT Interval:  452 QTC Calculation: 439 R Axis:   64  Text Interpretation: Sinus bradycardia with 1st degree A-V block Right bundle branch block Possible Lateral infarct (cited on or before 04-Dec-2019) When compared with ECG of 04-Dec-2019 12:44, No significant  change was found Confirmed by Theotis Flake [phone]) on 05/30/2023 11:46:04 AM    Prior CV Studies:   Echo September 2021:   1. Left ventricular ejection fraction, by estimation, is 45 to 50%. The  left ventricle has mildly decreased function. The left ventricle  demonstrates regional wall motion abnormalities (see scoring  diagram/findings for description). There is mild  concentric left ventricular hypertrophy. Left ventricular diastolic  parameters are consistent with Grade I diastolic dysfunction (impaired  relaxation). There is moderate hypokinesis of the left ventricular,  basal-mid inferolateral wall.   2. Right ventricular systolic function is normal. The right ventricular  size is normal.   3. The mitral valve is normal in structure. No evidence of mitral valve  regurgitation.   4. The aortic valve is tricuspid. Aortic valve regurgitation is not  visualized. No aortic stenosis is present.   Cardiac cath 06/17/16: Ost Cx to Mid Cx lesion, 20 %stenosed. Ost LM lesion, 40 %stenosed. 1st Diag lesion, 40 %stenosed. The left ventricular systolic function is normal. LV end diastolic pressure is normal. The left ventricular ejection fraction is greater than 65% by visual estimate. There is no mitral valve regurgitation.    Risk Assessment/Calculations:             Physical Exam:   VS:  BP 130/76 (BP Location: Right Arm, Patient Position: Sitting, Cuff Size: Large)   Pulse 65   Resp 16   Ht 5\' 9"  (1.753 m)  Wt 193 lb 12.8 oz (87.9 kg)   SpO2 98%   BMI 28.62 kg/m    Wt Readings from Last 3 Encounters:  05/30/23 193 lb 12.8 oz (87.9 kg)  05/16/23 195 lb (88.5 kg)  03/30/23 195 lb (88.5 kg)    GEN: Well nourished, well developed in no acute distress NECK: No JVD; No carotid bruits CARDIAC:  RRR, no murmurs, rubs, gallops RESPIRATORY:  Clear to auscultation without rales, wheezing or rhonchi  ABDOMEN: Soft, non-tender, non-distended EXTREMITIES:  No edema; No deformity    ASSESSMENT AND PLAN: .    CAD without angina: No chest pain. Cardiac cath March 2018 with mild to moderate non-obstructive CAD. Will continue Plavix , statin and beta blocker. He is on Plavix  because of his stroke in 2021.   HYPERTENSION:  BP is well controlled. No changes  HYPERLIPIDEMIA: LDL 67 2023. Continue statin and Zetia . Lipids and LFTS next week at PCP.    Chronic diastolic CHF: No volume overload on exam. Weight is stable. Lasix  as needed.     History of Stroke: Followed in neurology. Cardiac monitor with sinus and PVCs. Loop recorder not recommended by Neuro team.           Dispo: f/u in 1 yr.  Signed, Theotis Flake, PA-C

## 2023-05-18 ENCOUNTER — Ambulatory Visit: Payer: Medicare Other | Admitting: Internal Medicine

## 2023-05-24 ENCOUNTER — Ambulatory Visit: Payer: Medicare Other | Admitting: Internal Medicine

## 2023-05-30 ENCOUNTER — Encounter: Payer: Self-pay | Admitting: Physician Assistant

## 2023-05-30 ENCOUNTER — Telehealth: Payer: Self-pay | Admitting: Physician Assistant

## 2023-05-30 ENCOUNTER — Ambulatory Visit: Payer: Medicare Other | Attending: Physician Assistant | Admitting: Physician Assistant

## 2023-05-30 VITALS — BP 130/76 | HR 65 | Resp 16 | Ht 69.0 in | Wt 193.8 lb

## 2023-05-30 DIAGNOSIS — I10 Essential (primary) hypertension: Secondary | ICD-10-CM | POA: Diagnosis not present

## 2023-05-30 DIAGNOSIS — I5032 Chronic diastolic (congestive) heart failure: Secondary | ICD-10-CM | POA: Diagnosis not present

## 2023-05-30 DIAGNOSIS — I2510 Atherosclerotic heart disease of native coronary artery without angina pectoris: Secondary | ICD-10-CM

## 2023-05-30 DIAGNOSIS — E785 Hyperlipidemia, unspecified: Secondary | ICD-10-CM

## 2023-05-30 DIAGNOSIS — Z8673 Personal history of transient ischemic attack (TIA), and cerebral infarction without residual deficits: Secondary | ICD-10-CM

## 2023-05-30 NOTE — Patient Instructions (Signed)
 Medication Instructions:  Your physician recommends that you continue on your current medications as directed. Please refer to the Current Medication list given to you today.  *If you need a refill on your cardiac medications before your next appointment, please call your pharmacy*   Lab Work: None ordered today, when you see your Primary Care Dr, ask them to check a Lipid Panel  If you have labs (blood work) drawn today and your tests are completely normal, you will receive your results only by: MyChart Message (if you have MyChart) OR A paper copy in the mail If you have any lab test that is abnormal or we need to change your treatment, we will call you to review the results.   Testing/Procedures: None ordered   Follow-Up: At [facility], you and your health needs are our priority.  As part of our continuing mission to provide you with exceptional heart care, we have created designated Provider Care Teams.  These Care Teams include your primary Cardiologist (physician) and Advanced Practice Providers (APPs -  Physician Assistants and Nurse Practitioners) who all work together to provide you with the care you need, when you need it.  We recommend signing up for the patient portal called "MyChart".  Sign up information is provided on this After Visit Summary.  MyChart is used to connect with patients for Virtual Visits (Telemedicine).  Patients are able to view lab/test results, encounter notes, upcoming appointments, etc.  Non-urgent messages can be sent to your provider as well.   To learn more about what you can do with MyChart, go to [email or web address].    Your next appointment:   12 month(s)  Provider:   Antoinette Batman, MD     Other Instructions      1st Floor: - Lobby - Registration  - Pharmacy  - Lab - Cafe  2nd Floor: - PV Lab - Diagnostic Testing (echo, CT, nuclear med)  3rd Floor: - Vacant  4th Floor: - TCTS (cardiothoracic  surgery) - AFib Clinic - Structural Heart Clinic - Vascular Surgery  - Vascular Ultrasound  5th Floor: - [facility] (general and EP) - Clinical Pharmacy for coumadin, hypertension, lipid, weight-loss medications, and med management appointments    Valet parking services will be available as well.

## 2023-05-30 NOTE — Telephone Encounter (Signed)
 Pt c/o medication issue:  1. Name of Medication: Lasix   2. How are you currently taking this medication (dosage and times per day)?   3. Are you having a reaction (difficulty breathing--STAT)?   4. What is your medication issue? Patient was seen today by Reesa Cannon, he states she was going to prescribe him lasix  and he noticed on his AVS it wasn't listed on there. He would like for a script to be sent to [id number] Jonette Nestle, [place] - [address]

## 2023-05-31 MED ORDER — FUROSEMIDE 20 MG PO TABS: 20.0000 mg | ORAL_TABLET | Freq: Every day | ORAL | 1 refills | Status: DC | PRN

## 2023-05-31 NOTE — Telephone Encounter (Signed)
Per Jacolyn Reedy, PA-C, refill sent to [facility].

## 2023-06-10 ENCOUNTER — Ambulatory Visit: Payer: Medicare Other | Admitting: Internal Medicine

## 2023-06-17 ENCOUNTER — Encounter: Payer: Self-pay | Admitting: Internal Medicine

## 2023-06-17 ENCOUNTER — Ambulatory Visit: Payer: Medicare Other | Admitting: Internal Medicine

## 2023-06-17 VITALS — BP 100/60 | HR 62 | Temp 98.2°F | Ht 69.0 in | Wt 191.0 lb

## 2023-06-17 DIAGNOSIS — F418 Other specified anxiety disorders: Secondary | ICD-10-CM

## 2023-06-17 DIAGNOSIS — I2510 Atherosclerotic heart disease of native coronary artery without angina pectoris: Secondary | ICD-10-CM

## 2023-06-17 DIAGNOSIS — I10 Essential (primary) hypertension: Secondary | ICD-10-CM | POA: Diagnosis not present

## 2023-06-17 DIAGNOSIS — Z8673 Personal history of transient ischemic attack (TIA), and cerebral infarction without residual deficits: Secondary | ICD-10-CM | POA: Diagnosis not present

## 2023-06-17 DIAGNOSIS — H9193 Unspecified hearing loss, bilateral: Secondary | ICD-10-CM | POA: Diagnosis not present

## 2023-06-17 DIAGNOSIS — R739 Hyperglycemia, unspecified: Secondary | ICD-10-CM | POA: Diagnosis not present

## 2023-06-17 NOTE — Progress Notes (Signed)
 Subjective:  Patient ID: Larry Stone, male    DOB: 06-Mar-1951  Age: 73 y.o. MRN: [id number]  CC: Medical Management of Chronic Issues (3 mnth f/u, Pt cardiologist stated pt needed a lipid panel done and pt is needing a ENT referral sent in to [facility])   HPI Larry Stone presents for 3 mnth f/u, Pt cardiologist stated pt needed a lipid panel done and pt is needing a ENT referral sent in to [facility] F/u anxiety  Outpatient Medications Prior to Visit  Medication Sig Dispense Refill   ALPRAZolam (XANAX) 0.5 MG tablet TAKE 1 TABLET BY MOUTH THREE TIMES A DAY AS NEEDED 270 tablet 1   ammonium lactate (LAC-HYDRIN) 12 % lotion Apply topically as directed. 400 mL 3   buPROPion (WELLBUTRIN SR) 150 MG 12 hr tablet TAKE 1 TABLET BY MOUTH TWICE A DAY 180 tablet 3   carbidopa-levodopa (SINEMET IR) 25-100 MG tablet Take 0.5 tablets by mouth 3 (three) times daily. Start 0.5 tabs BID for 2 weeks then increase to 0.5 tabs TID 45 tablet 5   carvedilol (COREG) 25 MG tablet TAKE 1 TABLET BY MOUTH TWICE A DAY WITH FOOD 180 tablet 3   clopidogrel (PLAVIX) 75 MG tablet TAKE 1 TABLET BY MOUTH EVERY DAY 90 tablet 3   clotrimazole-betamethasone (LOTRISONE) cream APPLY AS DIRECTED 45 g 11   ezetimibe (ZETIA) 10 MG tablet Take 1 tablet (10 mg total) by mouth daily. Please keep scheduled appointment for future refills. Thank you. 30 tablet 0   Fluticasone-Umeclidin-Vilant (TRELEGY ELLIPTA) 100-62.5-25 MCG/ACT AEPB Inhale 1 puff into the lungs daily. (Patient taking differently: Inhale 1 puff into the lungs as needed.) 1 each 11   furosemide (LASIX) 20 MG tablet Take 1 tablet (20 mg total) by mouth daily as needed for fluid or edema. 30 tablet 1   hydrALAZINE (APRESOLINE) 50 MG tablet TAKE 1 TABLET BY MOUTH FOUR TIMES A DAY 360 tablet 3   ipratropium-albuterol (DUONEB) 0.5-2.5 (3) MG/3ML SOLN Take 3 mLs by nebulization every 4 (four) hours as needed (every 4-6 hours prn). Take by nebulization every 4-6 hours as  needed 360 mL 2   meclizine (ANTIVERT) 25 MG tablet Take 0.5 tablets (12.5 mg total) by mouth 3 (three) times daily as needed for dizziness. 30 tablet 0   metFORMIN (GLUCOPHAGE) 500 MG tablet TAKE 1 TABLET BY MOUTH EVERY DAY WITH BREAKFAST 90 tablet 1   montelukast (SINGULAIR) 10 MG tablet TAKE 1 TABLET BY MOUTH EVERYDAY AT BEDTIME 90 tablet 3   nitroGLYCERIN (NITROSTAT) 0.4 MG SL tablet PLACE 1 TABLET UNDER THE TONGUE EVERY 5 MINUTES AS NEEDED FOR CHEST PAIN. UP TO 3 DOSES. 75 tablet 1   pravastatin (PRAVACHOL) 40 MG tablet TAKE 1 TABLET BY MOUTH EVERY DAY 90 tablet 3   tamsulosin (FLOMAX) 0.4 MG CAPS capsule TAKE 1 CAPSULE BY MOUTH EVERY DAY 90 capsule 3   traMADol (ULTRAM) 50 MG tablet Take 1 tablet (50 mg total) by mouth every 6 (six) hours as needed. 120 tablet 1   FLUoxetine (PROZAC) 10 MG tablet TAKE 1 TABLET BY MOUTH EVERY DAY (Patient not taking: Reported on 06/17/2023) 90 tablet 1   No facility-administered medications prior to visit.    ROS: Review of Systems  Constitutional:  Negative for appetite change, fatigue and unexpected weight change.  HENT:  Positive for congestion and hearing loss. Negative for nosebleeds, sneezing, sore throat and trouble swallowing.   Eyes:  Negative for itching and visual disturbance.  Respiratory:  Negative for cough.   Cardiovascular:  Negative for chest pain, palpitations and leg swelling.  Gastrointestinal:  Negative for abdominal distention, blood in stool, diarrhea and nausea.  Genitourinary:  Negative for frequency and hematuria.  Musculoskeletal:  Positive for arthralgias, back pain and gait problem. Negative for joint swelling and neck pain.  Skin:  Negative for rash.  Neurological:  Negative for dizziness, tremors, speech difficulty and weakness.  Hematological:  Does not bruise/bleed easily.  Psychiatric/Behavioral:  Positive for dysphoric mood. Negative for agitation, sleep disturbance and suicidal ideas. The patient is nervous/anxious.      Objective:  BP 100/60   Pulse 62   Temp 98.2 F (36.8 C) (Oral)   Ht 5\' 9"  (1.753 m)   Wt 191 lb (86.6 kg)   SpO2 93%   BMI 28.21 kg/m   BP Readings from Last 3 Encounters:  06/17/23 100/60  05/30/23 130/76  03/30/23 (!) 118/52    Wt Readings from Last 3 Encounters:  06/17/23 191 lb (86.6 kg)  05/30/23 193 lb 12.8 oz (87.9 kg)  05/16/23 195 lb (88.5 kg)    Physical Exam Constitutional:      General: He is not in acute distress.    Appearance: Normal appearance. He is well-developed. He is obese. He is not ill-appearing.     Comments: NAD  Eyes:     Conjunctiva/sclera: Conjunctivae normal.     Pupils: Pupils are equal, round, and reactive to light.  Neck:     Thyroid: No thyromegaly.     Vascular: No JVD.  Cardiovascular:     Rate and Rhythm: Normal rate and regular rhythm.     Heart sounds: Normal heart sounds. No murmur heard.    No friction rub. No gallop.  Pulmonary:     Effort: Pulmonary effort is normal. No respiratory distress.     Breath sounds: Normal breath sounds. No wheezing or rales.  Chest:     Chest wall: No tenderness.  Abdominal:     General: Bowel sounds are normal. There is no distension.     Palpations: Abdomen is soft. There is no mass.     Tenderness: There is no abdominal tenderness. There is no guarding or rebound.  Musculoskeletal:        General: Tenderness present. Normal range of motion.     Cervical back: Normal range of motion.     Right lower leg: No edema.     Left lower leg: No edema.  Lymphadenopathy:     Cervical: No cervical adenopathy.  Skin:    General: Skin is warm and dry.     Findings: No rash.  Neurological:     Mental Status: He is alert and oriented to person, place, and time.     Cranial Nerves: No cranial nerve deficit.     Motor: No abnormal muscle tone.     Coordination: Coordination normal.     Gait: Gait normal.     Deep Tendon Reflexes: Reflexes are normal and symmetric.  Psychiatric:         Behavior: Behavior normal.        Thought Content: Thought content normal.        Judgment: Judgment normal.   R hip w/pain  Lab Results  Component Value Date   WBC 9.5 02/16/2023   HGB 13.9 02/16/2023   HCT 42.9 02/16/2023   PLT 168.0 02/16/2023   GLUCOSE 79 02/16/2023   CHOL 132 01/11/2022   TRIG 86.0 01/11/2022  HDL 47.90 01/11/2022   LDLCALC 67 01/11/2022   ALT 21 02/16/2023   AST 20 02/16/2023   NA 141 02/16/2023   K 5.3 (H) 02/16/2023   CL 103 02/16/2023   CREATININE 1.43 02/16/2023   BUN 21 02/16/2023   CO2 33 (H) 02/16/2023   TSH 2.42 02/16/2023   PSA 0.33 01/11/2022   INR 1.1 12/04/2019   HGBA1C 5.8 02/16/2023    VAS US CAROTID Result Date: 06/02/2022 Carotid Arterial Duplex Study Patient Name:  DUBLIN GRAYER  Date of Exam:   06/02/2022 Medical Rec #: [id number]        Accession #:    [id number] Date of Birth: [birth year]         Patient Gender: M Patient Age:   55 years Exam Location:  [facility] Procedure:      VAS US CAROTID Referring Phys: PRAMOD SETHI --------------------------------------------------------------------------------  Indications:       History of CVA, follow up. Risk Factors:      Hypertension, hyperlipidemia, past history of smoking, prior                    MI, coronary artery disease, prior CVA. Comparison Study:  12-10-2019 Carotid duplex showed 1-39% bilateral ICA Performing Technologist: Jean Rosenthal RDMS, RVT  Examination Guidelines: A complete evaluation includes B-mode imaging, spectral Doppler, color Doppler, and power Doppler as needed of all accessible portions of each vessel. Bilateral testing is considered an integral part of a complete examination. Limited examinations for reoccurring indications may be performed as noted.  Right Carotid Findings: +----------+--------+--------+--------+------------------+--------+           PSV cm/sEDV cm/sStenosisPlaque DescriptionComments  +----------+--------+--------+--------+------------------+--------+ CCA Prox  85      16              calcific                   +----------+--------+--------+--------+------------------+--------+ CCA Distal63      17              calcific                   +----------+--------+--------+--------+------------------+--------+ ICA Prox  80      22      1-39%   calcific                   +----------+--------+--------+--------+------------------+--------+ ICA Mid   125     32                                         +----------+--------+--------+--------+------------------+--------+ ICA Distal70      21                                         +----------+--------+--------+--------+------------------+--------+ ECA       125     17              calcific                   +----------+--------+--------+--------+------------------+--------+ +----------+--------+-------+----------------+-------------------+           PSV cm/sEDV cmsDescribe        Arm Pressure (mmHG) +----------+--------+-------+----------------+-------------------+ [id number]            Multiphasic, WNL                    +----------+--------+-------+----------------+-------------------+ +---------+--------+--+--------+--+---------+  VertebralPSV cm/s44EDV cm/s13Antegrade +---------+--------+--+--------+--+---------+  Left Carotid Findings: +----------+--------+--------+--------+------------------+------------------+           PSV cm/sEDV cm/sStenosisPlaque DescriptionComments           +----------+--------+--------+--------+------------------+------------------+ CCA Prox  103     22                                                   +----------+--------+--------+--------+------------------+------------------+ CCA Distal74      16                                intimal thickening +----------+--------+--------+--------+------------------+------------------+ ICA Prox  85       27      1-39%   calcific                             +----------+--------+--------+--------+------------------+------------------+ ICA Mid   93      29                                                   +----------+--------+--------+--------+------------------+------------------+ ICA Distal88      29                                                   +----------+--------+--------+--------+------------------+------------------+ ECA       119     14                                                   +----------+--------+--------+--------+------------------+------------------+ +----------+--------+--------+----------------+-------------------+           PSV cm/sEDV cm/sDescribe        Arm Pressure (mmHG) +----------+--------+--------+----------------+-------------------+ [id number]             Multiphasic, WNL                    +----------+--------+--------+----------------+-------------------+ +---------+--------+--+--------+--+---------+ VertebralPSV cm/s47EDV cm/s14Antegrade +---------+--------+--+--------+--+---------+   Summary: Right Carotid: Velocities in the right ICA are consistent with a 1-39% stenosis. Left Carotid: Velocities in the left ICA are consistent with a 1-39% stenosis. Vertebrals:  Bilateral vertebral arteries demonstrate antegrade flow. Subclavians: Normal flow hemodynamics were seen in bilateral subclavian              arteries. *See table(s) above for measurements and observations.  Electronically signed by Delia Heady MD on 06/02/2022 at 4:01:09 PM.    Final     Assessment & Plan:   Problem List Items Addressed This Visit     Essential hypertension   Relevant Orders   Comprehensive metabolic panel   CBC with Differential/Platelet   Hemoglobin A1c   Lipid panel   Hyperglycemia   Relevant Orders   Comprehensive metabolic panel   CBC with Differential/Platelet   Hemoglobin A1c   Lipid panel   CAD (coronary artery disease)    Relevant Orders   Comprehensive  metabolic panel   Depression with anxiety   Relevant Orders   Comprehensive metabolic panel   CBC with Differential/Platelet   Hemoglobin A1c   Lipid panel   History of CVA (cerebrovascular accident)   Relevant Orders   TSH   CK   Other Visit Diagnoses       Bilateral hearing loss, unspecified hearing loss type    -  Primary   Relevant Orders   Ambulatory referral to ENT         No orders of the defined types were placed in this encounter.     Follow-up: Return in about 4 months (around 10/15/2023) for a follow-up visit.  Sonda Primes, MD

## 2023-06-20 ENCOUNTER — Other Ambulatory Visit: Payer: Self-pay | Admitting: Internal Medicine

## 2023-06-25 ENCOUNTER — Other Ambulatory Visit: Payer: Self-pay | Admitting: Physician Assistant

## 2023-07-06 ENCOUNTER — Ambulatory Visit: Payer: Medicare Other | Admitting: Adult Health

## 2023-07-08 ENCOUNTER — Other Ambulatory Visit: Payer: Self-pay | Admitting: Cardiovascular Disease

## 2023-07-26 DIAGNOSIS — H9113 Presbycusis, bilateral: Secondary | ICD-10-CM | POA: Insufficient documentation

## 2023-07-28 ENCOUNTER — Other Ambulatory Visit (INDEPENDENT_AMBULATORY_CARE_PROVIDER_SITE_OTHER)

## 2023-07-28 ENCOUNTER — Encounter: Payer: Self-pay | Admitting: Family

## 2023-07-28 DIAGNOSIS — Z8673 Personal history of transient ischemic attack (TIA), and cerebral infarction without residual deficits: Secondary | ICD-10-CM

## 2023-07-28 DIAGNOSIS — I10 Essential (primary) hypertension: Secondary | ICD-10-CM

## 2023-07-28 DIAGNOSIS — I2510 Atherosclerotic heart disease of native coronary artery without angina pectoris: Secondary | ICD-10-CM

## 2023-07-28 DIAGNOSIS — F418 Other specified anxiety disorders: Secondary | ICD-10-CM | POA: Diagnosis not present

## 2023-07-28 DIAGNOSIS — R739 Hyperglycemia, unspecified: Secondary | ICD-10-CM | POA: Diagnosis not present

## 2023-07-28 LAB — CBC WITH DIFFERENTIAL/PLATELET
Basophils Absolute: 0 10*3/uL (ref 0.0–0.1)
Basophils Relative: 0.5 % (ref 0.0–3.0)
Eosinophils Absolute: 0.4 10*3/uL (ref 0.0–0.7)
Eosinophils Relative: 4.1 % (ref 0.0–5.0)
HCT: 40.7 % (ref 39.0–52.0)
Hemoglobin: 13.5 g/dL (ref 13.0–17.0)
Lymphocytes Relative: 17.2 % (ref 12.0–46.0)
Lymphs Abs: 1.5 10*3/uL (ref 0.7–4.0)
MCHC: 33.3 g/dL (ref 30.0–36.0)
MCV: 96.5 fl (ref 78.0–100.0)
Monocytes Absolute: 0.9 10*3/uL (ref 0.1–1.0)
Monocytes Relative: 10.5 % (ref 3.0–12.0)
Neutro Abs: 6 10*3/uL (ref 1.4–7.7)
Neutrophils Relative %: 67.7 % (ref 43.0–77.0)
Platelets: 152 10*3/uL (ref 150.0–400.0)
RBC: 4.22 Mil/uL (ref 4.22–5.81)
RDW: 13.3 % (ref 11.5–15.5)
WBC: 8.8 10*3/uL (ref 4.0–10.5)

## 2023-07-28 LAB — COMPREHENSIVE METABOLIC PANEL WITH GFR
ALT: 5 U/L (ref 0–53)
AST: 17 U/L (ref 0–37)
Albumin: 3.9 g/dL (ref 3.5–5.2)
Alkaline Phosphatase: 60 U/L (ref 39–117)
BUN: 21 mg/dL (ref 6–23)
CO2: 29 meq/L (ref 19–32)
Calcium: 9 mg/dL (ref 8.4–10.5)
Chloride: 104 meq/L (ref 96–112)
Creatinine, Ser: 1.41 mg/dL (ref 0.40–1.50)
GFR: 49.55 mL/min — ABNORMAL LOW (ref >60.00)
Glucose, Bld: 82 mg/dL (ref 70–99)
Potassium: 4.5 meq/L (ref 3.5–5.1)
Sodium: 139 meq/L (ref 135–145)
Total Bilirubin: 0.6 mg/dL (ref 0.2–1.2)
Total Protein: 6.2 g/dL (ref 6.0–8.3)

## 2023-07-28 LAB — LIPID PANEL
Cholesterol: 123 mg/dL (ref 0–200)
HDL: 48.8 mg/dL (ref >39.00)
LDL Cholesterol: 63 mg/dL (ref 0–99)
NonHDL: 74.6
Total CHOL/HDL Ratio: 3
Triglycerides: 58 mg/dL (ref 0.0–149.0)
VLDL: 11.6 mg/dL (ref 0.0–40.0)

## 2023-07-28 LAB — HEMOGLOBIN A1C: Hgb A1c MFr Bld: 5.6 % (ref 4.6–6.5)

## 2023-07-28 LAB — CK: Total CK: 54 U/L (ref 7–232)

## 2023-07-28 LAB — TSH: TSH: 2.05 u[IU]/mL (ref 0.35–5.50)

## 2023-08-01 ENCOUNTER — Other Ambulatory Visit: Payer: Self-pay | Admitting: Internal Medicine

## 2023-08-11 ENCOUNTER — Ambulatory Visit: Payer: Medicare Other | Admitting: Podiatry

## 2023-08-12 ENCOUNTER — Ambulatory Visit: Admitting: Podiatry

## 2023-08-22 ENCOUNTER — Ambulatory Visit: Admitting: Podiatry

## 2023-08-28 ENCOUNTER — Other Ambulatory Visit: Payer: Self-pay | Admitting: Cardiovascular Disease

## 2023-08-30 ENCOUNTER — Encounter: Payer: Self-pay | Admitting: Podiatry

## 2023-08-30 ENCOUNTER — Ambulatory Visit: Admitting: Podiatry

## 2023-08-30 DIAGNOSIS — B351 Tinea unguium: Secondary | ICD-10-CM

## 2023-08-30 DIAGNOSIS — M79675 Pain in left toe(s): Secondary | ICD-10-CM | POA: Diagnosis not present

## 2023-08-30 DIAGNOSIS — M79674 Pain in right toe(s): Secondary | ICD-10-CM | POA: Diagnosis not present

## 2023-08-30 DIAGNOSIS — N183 Chronic kidney disease, stage 3 unspecified: Secondary | ICD-10-CM

## 2023-08-30 NOTE — Progress Notes (Signed)
This patient presents to the office with chief complaint of long thick painful nails.  Patient says the nails are painful walking and wearing shoes.  This patient is unable to self treat.  This patient is unable to trim his nails since he is unable to reach his  nails.  She presents to the office for preventative foot care services.  General Appearance  Alert, conversant and in no acute stress.  Vascular  Dorsalis pedis and posterior tibial  pulses are palpable  bilaterally.  Capillary return is within normal limits  bilaterally. Temperature is within normal limits  bilaterally.  Neurologic  Senn-Weinstein monofilament wire test within normal limits  bilaterally. Muscle power within normal limits bilaterally.  Nails Thick disfigured discolored nails with subungual debris  from hallux to fifth toes bilaterally. No evidence of bacterial infection or drainage bilaterally.  Orthopedic  No limitations of motion  feet .  No crepitus or effusions noted.  No bony pathology or digital deformities noted.  Skin  normotropic skin with no porokeratosis noted bilaterally.  No signs of infections or ulcers noted.     Onychomycosis  Nails  B/L.  Pain in right toes  Pain in left toes  Debridement of nails both feet followed trimming the nails with dremel tool.    RTC 3 months.   Gardiner Barefoot DPM

## 2023-08-31 ENCOUNTER — Other Ambulatory Visit: Payer: Self-pay | Admitting: Internal Medicine

## 2023-08-31 DIAGNOSIS — E785 Hyperlipidemia, unspecified: Secondary | ICD-10-CM

## 2023-09-28 ENCOUNTER — Ambulatory Visit: Admitting: Internal Medicine

## 2023-10-07 ENCOUNTER — Other Ambulatory Visit: Payer: Self-pay | Admitting: Cardiovascular Disease

## 2023-10-10 ENCOUNTER — Encounter: Payer: Self-pay | Admitting: Adult Health

## 2023-10-10 ENCOUNTER — Ambulatory Visit: Payer: Medicare Other | Admitting: Adult Health

## 2023-10-10 VITALS — BP 152/72 | HR 53 | Ht 69.0 in | Wt 190.0 lb

## 2023-10-10 DIAGNOSIS — I6381 Other cerebral infarction due to occlusion or stenosis of small artery: Secondary | ICD-10-CM

## 2023-10-10 DIAGNOSIS — G214 Vascular parkinsonism: Secondary | ICD-10-CM

## 2023-10-10 DIAGNOSIS — G3184 Mild cognitive impairment, so stated: Secondary | ICD-10-CM

## 2023-10-10 MED ORDER — CARBIDOPA-LEVODOPA 25-100 MG PO TABS: 0.5000 | ORAL_TABLET | Freq: Three times a day (TID) | ORAL | 3 refills | Status: AC

## 2023-10-10 NOTE — Progress Notes (Signed)
 [facility] [address] [place]. [place] [address] [phone]       OFFICE FOLLOW UP NOTE  [name] Date of Birth:  07-08-1950 Medical Record Number:  [id number]  Primary neurologist: Dr. Rosemarie Reason for visit: Stroke, MCI, parkinsonism   Chief Complaint  Patient presents with   Transient Ischemic Attack    Rm 3 alone Pt is well, reports residual numbness on R side. Cognition has been stable, no new concerns.        HPI:   Update 10/10/2023 JM: Patient returns for follow-up visit unaccompanied.  Overall stable since prior visit.  Continues to have fluctuation of right sided numbness, primarily affecting right hand and foot but at times can be present up into his arm and up his leg, does not last long. Can have difficulty gripping/holding objects or standing when present. No clear trigger for worsening symptoms. Denies neck or low back pain. Feels cognition has been overall stable, denies progression. Continues to maintain ADLs and IADLs independently.  Continues to drive without difficulty. No new stroke/TIA symptoms. Remains on Sinemet  half tab TID, tolerating without side effects. Notes improvement of tremor and gait. No recent falls.  Blood pressure mildly elevated today, asymptomatic, typically stable.  Routinely follows with PCP for stroke risk factor management and routine follow-up with cardiology for CHF.      History provided for reference purposes only Update 03/30/2023 JM: Patient returns for 22-month follow-up unaccompanied.  At prior visit with Dr. 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  He does have chronic right hand and foot paresthesias poststroke but has noticed slight worsening over the past 3-4 months. Previously numbness only affecting first 3 digits but now affecting whole hand, noticed some worsening numbness in foot as well. Denies any  weakness or pain. More just bothersome. Can have some numbness in right arm upon awakening but will gradually resolve after being awake for a little while and then will only.be in his hand.  Denies neck or lower back pain.  Does have history of B12 deficiency, previously on supplement but reports B12 level elevated therefore was discontinued (prior B12 level check in 2017 which was satisfactory).  Denies any new stroke/TIA symptoms.  Compliant on Plavix , Zetia  and pravastatin .  Routinely follows with PCP for stroke risk factor management.   He is no longer taking Sinemet  due to nausea, reports tolerating well while gradually tolerating up to 1 tab BID but then increased up to 4x per day and started to have significant nausea therefore he discontinued.  He did see some improvement of tremors and gait/balance while taking. Reports stopping Abilify  and no longer experiencing drooling.  Cognition has remained stable, more issues with short term memory, reports donepezil  10mg  daily caused diarrhea therefore discontinued. Continues to maintain ADLs and IADLs independently as well as driving without difficulty.  Update 12/22/2022 Dr. Rosemarie: Patient is seen for follow-up today upon his request after last visit 7 months ago.  He is has new complaint of increased drooling on the right side of his mouth.  This is intermittent and can occur during the day or night.  There are no obvious triggers.  He has to use a lot of napkins to wipe his face.  He denies any trouble swallowing or any pain in his teeth or swelling inside his mouth.  He does admit to his voice getting softer difficulty here.  He is also noticed  increased difficulty in getting out of a chair and in wearing his clothes.  He has noticed a tremor in his hands particularly in the left 1 last several months.  He denies any stooped posture or walking with feet stuck to the ground.  He states his posture balance is poor and at times tends to fall backwards.  He has  started walking with some shuffling.  He denies any recurrent stroke or TIA symptoms.  He remains on Plavix  which is tolerating well without bruising or bleeding.  States his blood pressure is under good control.  His sugars have also been quite fine.  He did undergo follow-up carotid ultrasound on 06/02/2022 which showed no significant extracranial stenosis.  He continues to have short-term memory difficulties which are unchanged the long-term memory is good.  He is still on Aricept  5 mg daily which is tolerating well without side effects but he has never increase the dose to 10 mg.  He is still independent actives of daily living.  He has been driving and states he never has gotten lost.  Continues to have mild paresthesias in the right face hands and feet which are intermittent and states she has gotten used to it and is tolerating it well.  Update 05/25/2022 Dr. Rosemarie: He returns for follow-up after last visit in May 2023.  He states that he has noticed worsening of paresthesias on his face as well as right hand and leg.  These are intermittent and they come and go and can last variably from few minutes to up to an hour.  He has gotten used to them.  He is also noted some decrease in his short-term memory phonation is difficult for him to remember.  His long-term memory is not as bad.  He has never gotten while driving.  He is still independent in all activities of daily living.  His primary care physician gave him a prescription for Aricept  5 mg daily for a month but he has not yet started it.  He has an appointment to see his primary care physician for follow-up lab work dated done today for his lipid profile A1c.  He has been compliant with taking his Plavix  which is tolerating well without bruising or bleeding.  His blood pressure is under good control and today it is 135/75.  He is tolerating Pravachol  and Zetia  well without side effects.  He states his sugars are doing better now.  Update 09/16/2021 JM:  Patient returns for 60-month stroke follow-up.  He has been stable from stroke standpoint without new stroke/TIA symptoms.  Reports continued right hand and face dysesthesias. He has greater difficulty with his hand which he feels can fluctuate between feeling cold and hot.  Use of compression glove provides some relief. Continues to have difficulty with right hamstring tear - currently using tramadol  which helps pain some but is still limited in activity.  This is managed by PCP.  Compliant on Plavix , pravastatin  and Zetia , denies side effects.  Blood pressure today 147/76. Monitors at home and typically 130s/70s.  Routinely follows with PCP and cardiology.  No further concerns at this time  Update 03/03/2021 JM: Returns for 50-month stroke follow-up unaccompanied  Overall stable -denies new stroke/TIA symptoms Continued right hand and face post stroke pain - previously rx'd amitriptyline  (for both pain and anxiety) but did not take due to interaction with bupropion .  Prior intolerance to topiramate  and pregabalin .  He does continue to experience anxiety -currently on bupropion  and Xanax  managed  by PCP Has been dealing with right hamstring tear - has been placed on tramadol  due to severe pain - questions if tramadol  has been helping post stroke pain some or if he hasn't been as focused on post stroke pain due to severe pain of right thigh - being followed by ortho Dr. Jerri  Compliant on Plavix , pravastatin  and Zetia  -denies side effects Blood pressure today 115/64 Recent A1c 5.8 on metformin  500 mg daily  No new concerns at this time  Update 10/27/2020 JM: Mr. Tall returns for 4 month stroke follow-up unaccompanied.  Stable from stroke standpoint without new stroke/TIA symptoms.  He continues to have difficulty with right face, hand and occasional foot paresthesias describing as burning and electrical shock sensation.  He has previously trialed pregabalin  and topiramate  but unable to tolerate.   Previously complained worsening gait difficulty, slurred speech and cognitive impairment after initiating pregabalin  which resolved shortly after discontinuing.  He is currently participating in PT for right posterior thigh pain followed by orthopedics Dr. Hughie.  He also reports memory changes since his stroke including increased irritability and low tolerance levels - hx of depression and anxiety currently on bupropion  and Xanax  as needed per PCP. Reports compliance on Plavix , pravastatin  and Zetia  without associated side effects.  Blood pressure today 134/72.  No further concerns at this time.   Update 06/19/2020 JM: Mr. Careaga returns for stroke follow-up after prior visit approximately 5 months ago with Dr. Rosemarie.  Topiramate  initiated at prior visit for poststroke paresthesias but unable to tolerate. Placed on Lyrica  50 mg twice daily beginning of November and reports approximately 2 weeks after, he was noticing increased lethargy and fluctuation of gait impairment with imbalance (getting pulled to the right) and lightheadedness, slurred speech and cognitive difficulties such as delayed responses and short-term memory difficulties.  Gait impairment with imbalance and slurred speech all present initially with stroke.  Denies new stroke/TIA symptoms.   He is unsure if Lyrica  was beneficial for paresthesias as he does report some improvement since prior visit but right hand and right facial paresthesias still present. He also admits to struggling with depression with history of depression PTA on bupropion  150 mg twice daily but noticed worsening after his stroke.  He is also on Xanax  as needed per PCP He has been trying to keep active and does report doing HEP daily as advised during therapy sessions  Reports compliance on Plavix  and pravastatin  and Zetia -denies side effects Blood pressure today 135/66  No further concerns at this time  Initial visit 02/06/2020 Dr. Rosemarie: Mr. Valeriano is a pleasant  73 year old Caucasian male with past medical history of hypertension, hyper lipidemia, diabetes, congestive heart failure and coronary artery disease who developed sudden onset of right lower face and hand numbness as well as imbalance and difficulty walking on 12/03/2019.  He did not seek medical help the same day as if waited for it to get better.  Next day he went to the hospital but got tired waiting in the ER since he was not seen and instead went to see his primary physician Dr. Garald who ordered an outpatient stroke work-up.  MRI scan of the brain done on 12/20/2019 shows a subacute left thalamic lacunar infarct.  Carotid ultrasound on 12/10/2019 showed no significant extracranial stenosis.  Echocardiogram on 12/28/2018 showed slightly diminished ejection fraction of 45 to 50% but no clot or definite cardiac source of embolism was noted.  He underwent 30 days of outpatient telemetry cardiac monitoring which was  negative for atrial fibrillation or significant arrhythmias.  Carotid ultrasound on 12/10/2019 was unremarkable.  LDL cholesterol was 78 mg percent on 12/26/2019.  Patient is presently on Plavix  75 mg daily which is tolerating well without bruising or bleeding.  He states his gait and balance have improved and he has finished outpatient physical and occupational therapy.  He still stumbles occasionally but as long to walk slowly and carefully.  He has had no falls or injuries.  He however still has persistent paresthesias in his right face and right hand which are bothersome and he would like to try some medications for this.  He has had no recurrent stroke or TIA symptoms.  States his blood pressure is well controlled and today it is 113/65 in office.  States his sugars are also doing well however he cannot tell me when his last hemoglobin A1c was checked.  He has been scheduled to undergo loop recorder pending my consultation visit today.  ROS:   14 system review of systems is positive for those  listed in HPI and all other systems negative  PMH:  Past Medical History:  Diagnosis Date   Allergy     Anxiety    CAD    a. acute inferior lateral wall infarction in September 2004 treated medically. b.  cath 06/17/16 showing mild nonobstructive CAD with 30-40% ostial LM (eccentric), elevated LV filling pressures and normal LV function.   Chronic diastolic CHF (congestive heart failure) (HCC)    CKD (chronic kidney disease), stage II    COPD (chronic obstructive pulmonary disease) (HCC)    Diverticulosis    DJD (degenerative joint disease)    GERD (gastroesophageal reflux disease)    HTN (hypertension)    Hyperlipidemia    Low back pain syndrome    Myocardial infarction (HCC) 2004   Obesity    OSA (obstructive sleep apnea)    RBBB    Recurrent aspiration bronchitis/pneumonia    Recurrent aspiration pneumonia (HCC)    thelbert 07/13/2016   Stroke (HCC)    Thrombocytopenia (HCC)    Tubular adenoma of colon 2014    Social History:  Social History   Socioeconomic History   Marital status: Married    Spouse name: Mary   Number of children: 1   Years of education: Not on file   Highest education level: Some college, no degree  Occupational History   Occupation: Copywriter, advertising of [employer]  Tobacco Use   Smoking status: Former    Current packs/day: 0.00    Average packs/day: 1 pack/day for 20.0 years (20.0 ttl pk-yrs)    Types: Cigarettes, Cigars    Start date: 05/08/1978    Quit date: 05/08/1998    Years since quitting: 25.4   Smokeless tobacco: Never   Tobacco comments:    quit in 2005  Vaping Use   Vaping status: Never Used  Substance and Sexual Activity   Alcohol use: Yes    Alcohol/week: 1.0 standard drink of alcohol    Types: 1 Standard drinks or equivalent per week    Comment: rarely   Drug use: No   Sexual activity: Yes  Other Topics Concern   Not on file  Social History Narrative   Lives with spouse only   Right Handed   Drinks <1 cup caffeine  daily   Social Drivers of Health   Financial Resource Strain: Low Risk  (10/07/2023)   Overall Financial Resource Strain (CARDIA)    Difficulty of Paying Living Expenses: Not hard at  all  Food Insecurity: No Food Insecurity (10/07/2023)   Hunger Vital Sign    Worried About Running Out of Food in the Last Year: Never true    Ran Out of Food in the Last Year: Never true  Transportation Needs: No Transportation Needs (10/07/2023)   PRAPARE - Administrator, Civil Service (Medical): No    Lack of Transportation (Non-Medical): No  Physical Activity: Inactive (10/07/2023)   Exercise Vital Sign    Days of Exercise per Week: 0 days    Minutes of Exercise per Session: Not on file  Stress: Stress Concern Present (10/07/2023)   [employer] of Occupational Health - Occupational Stress Questionnaire    Feeling of Stress: To some extent  Social Connections: Unknown (10/07/2023)   Social Connection and Isolation Panel    Frequency of Communication with Friends and Family: Twice a week    Frequency of Social Gatherings with Friends and Family: Patient declined    Attends Religious Services: Patient declined    Database administrator or Organizations: No    Attends Engineer, structural: Not on file    Marital Status: Married  Catering manager Violence: Not At Risk (05/16/2023)   Humiliation, Afraid, Rape, and Kick questionnaire    Fear of Current or Ex-Partner: No    Emotionally Abused: No    Physically Abused: No    Sexually Abused: No    Medications:   Current Outpatient Medications on File Prior to Visit  Medication Sig Dispense Refill   ALPRAZolam  (XANAX ) 0.5 MG tablet TAKE 1 TABLET BY MOUTH THREE TIMES A DAY AS NEEDED 270 tablet 0   ammonium lactate  (LAC-HYDRIN ) 12 % lotion Apply topically as directed. 400 mL 3   buPROPion  (WELLBUTRIN  SR) 150 MG 12 hr tablet TAKE 1 TABLET BY MOUTH TWICE A DAY 180 tablet 3   carbidopa -levodopa  (SINEMET  IR) 25-100 MG tablet Take 0.5  tablets by mouth 3 (three) times daily. Start 0.5 tabs BID for 2 weeks then increase to 0.5 tabs TID 45 tablet 5   carvedilol  (COREG ) 25 MG tablet TAKE 1 TABLET BY MOUTH TWICE A DAY WITH FOOD 180 tablet 3   clopidogrel  (PLAVIX ) 75 MG tablet TAKE 1 TABLET BY MOUTH EVERY DAY 90 tablet 2   clotrimazole -betamethasone  (LOTRISONE ) cream APPLY AS DIRECTED 45 g 11   ezetimibe  (ZETIA ) 10 MG tablet Take 1 tablet (10 mg total) by mouth daily. 90 tablet 3   FLUoxetine  (PROZAC ) 10 MG tablet TAKE 1 TABLET BY MOUTH EVERY DAY 90 tablet 1   Fluticasone-Umeclidin-Vilant (TRELEGY ELLIPTA ) 100-62.5-25 MCG/ACT AEPB Inhale 1 puff into the lungs daily. (Patient taking differently: Inhale 1 puff into the lungs as needed.) 1 each 11   furosemide  (LASIX ) 20 MG tablet TAKE 1 TABLET (20 MG TOTAL) BY MOUTH DAILY AS NEEDED FOR FLUID OR EDEMA. 90 tablet 1   hydrALAZINE  (APRESOLINE ) 50 MG tablet TAKE 1 TABLET BY MOUTH FOUR TIMES A DAY 360 tablet 3   ipratropium-albuterol  (DUONEB) 0.5-2.5 (3) MG/3ML SOLN Take 3 mLs by nebulization every 4 (four) hours as needed (every 4-6 hours prn). Take by nebulization every 4-6 hours as needed 360 mL 2   meclizine  (ANTIVERT ) 25 MG tablet Take 0.5 tablets (12.5 mg total) by mouth 3 (three) times daily as needed for dizziness. 30 tablet 0   metFORMIN  (GLUCOPHAGE ) 500 MG tablet TAKE 1 TABLET BY MOUTH EVERY DAY WITH BREAKFAST 90 tablet 1   montelukast  (SINGULAIR ) 10 MG tablet TAKE 1 TABLET BY MOUTH  EVERYDAY AT BEDTIME 90 tablet 3   nitroGLYCERIN  (NITROSTAT ) 0.4 MG SL tablet PLACE 1 TABLET UNDER THE TONGUE EVERY 5 MINUTES AS NEEDED FOR CHEST PAIN. UP TO 3 DOSES. 75 tablet 1   pravastatin  (PRAVACHOL ) 40 MG tablet TAKE 1 TABLET BY MOUTH EVERY DAY 90 tablet 3   tamsulosin  (FLOMAX ) 0.4 MG CAPS capsule TAKE 1 CAPSULE BY MOUTH EVERY DAY 90 capsule 3   traMADol  (ULTRAM ) 50 MG tablet TAKE 1 TABLET BY MOUTH EVERY 6 HOURS AS NEEDED. 120 tablet 1   No current facility-administered medications on file prior to  visit.    Allergies:   Allergies  Allergen Reactions   Ace Inhibitors Swelling   Angiotensin Receptor Blockers Swelling   Aspirin Hives   Amlodipine      Leg swelling   Crestor [Rosuvastatin Calcium ]     myalgias   Cymbalta  [Duloxetine  Hcl]     Dizzy    Lipitor [Atorvastatin  Calcium ]     arthralgias   Codeine Nausea And Vomiting   Irbesartan Other (See Comments)    REACTION: allergic to ARBs w/ angioedema   Ramipril Other (See Comments)    REACTION: Allergic to ACE's w/ angioedema...    Physical Exam Today's Vitals   10/10/23 1534 10/10/23 1539 10/10/23 1605  BP: (!) 169/75 (!) 149/81 (!) 152/72  Pulse: (!) 55 (!) 55 (!) 53  Weight: 190 lb (86.2 kg)    Height: 5' 9 (1.753 m)     Body mass index is 28.06 kg/m.   General: well developed, well nourished pleasant elderly Caucasian male, seated, in no evident distress  Neurologic Exam Mental Status: Awake and fully alert.  Fluent speech and language.  Mild hypophonia.  Oriented to place and time. Recent and remote memory intact. Attention span, concentration and fund of knowledge appropriate during visit. Mood and affect appropriate.  Mild facial masking. Cranial Nerves: Pupils equal, briskly reactive to light. Extraocular movements full without nystagmus. Visual fields full to confrontation. Hearing diminished despite hearing aid. Facial sensation intact.  Mild right nasolabial fold flattening.  Tongue, palate moves normally and symmetrically.  Motor: Normal bulk and tone. Normal strength in all tested extremity muscles.  Mild pill-rolling resting tremor of the left hand. Slight tremor left greater than right of outstretched hands bilaterally. Decreased fine motor control L>R UE.  No significant cogwheel rigidity. Sensory.: intact to touch , pinprick , position and vibratory sensation -subjective paresthesias in the right face and foot, odd feeling with light touch over right hand Gait and Station: Arises from chair without  difficulty. Stance is slightly hunched. Gait demonstrates normal stride length and balance without use of assistive device.  Diminished left arm swing while walking.   No festination or shuffling feet Reflexes: 1+ and symmetric. Toes downgoing.       ASSESSMENT/PLAN: 73 year old Caucasian male with left thalamic stroke in August 2021 from small vessel disease with residual right face and hand dysesthesias which appear to be bothersome now..  Vascular risk factors of hypertension hyperlipidemia coronary artery disease.  He also has mild memory loss likely due to mild cognitive impairment. 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  Post stroke parkinsonism  - mild L>R tremor, mild L>R bradykinesia, decreased arm swing, hypophonia -- overall improved on Sinemet  low dose - Continue Sinemet  25/100 0.5 tab TID - refill provided  - Difficulty tolerating Sinemet  1 tab 4 times daily - drooling resolved after stopping Abilify    -continue  to stay active with routine physical activity and exercise   2.  MCI  -subjectively stable  -Donepezil  10 mg daily as GI side effects, tolerated 5mg  dosing well but declines interest in restarting as he has been stable  -can also consider initiating memantine in the future if needed   3. Hx of stroke  - Continued fluctuating right sided paresthesias, no weakness or pain. Suspect more post stroke but again offered EMG/NCV to rule out compressive etiology but declines interest. He will call with any worsening symptoms. Denies neck or low back pain.   -Continue clopidogrel , Zetia  and pravastatin  for secondary stroke prevention measures managed/prescribed by PCP  -Continue close PCP and cardiology follow-up for aggressive stroke risk factor management     Follow-up in 6-8 months with Dr. Rosemarie or call earlier if needed      I personally spent a total of 30 minutes in the care of the patient today  including preparing to see the patient, performing a medically appropriate exam/evaluation, counseling and educating, placing orders, and documenting clinical information in the EHR.   Harlene Bogaert, AGNP-BC  [facility] [address] [place], [place] [address]  Phone [phone] Fax [phone] Note: This document was prepared with digital dictation and possible smart phrase technology. Any transcriptional errors that result from this process are unintentional.

## 2023-10-10 NOTE — Patient Instructions (Addendum)
 Your Plan:  Continue Sinemet  0.5 tab three times daily  Continue to monitor right sided numbness - please call if this should worsen      Follow up in 6-8 months with Dr. Rosemarie or call earlier if needed     Thank you for coming to see us  at [facility]. I hope we have been able to provide you high quality care today.  You may receive a patient satisfaction survey over the next few weeks. We would appreciate your feedback and comments so that we may continue to improve ourselves and the health of our patients.

## 2023-10-11 ENCOUNTER — Ambulatory Visit (INDEPENDENT_AMBULATORY_CARE_PROVIDER_SITE_OTHER)

## 2023-10-11 ENCOUNTER — Encounter: Payer: Self-pay | Admitting: Internal Medicine

## 2023-10-11 ENCOUNTER — Ambulatory Visit: Admitting: Internal Medicine

## 2023-10-11 VITALS — BP 130/70 | HR 59 | Temp 97.9°F | Ht 68.0 in | Wt 191.6 lb

## 2023-10-11 DIAGNOSIS — J441 Chronic obstructive pulmonary disease with (acute) exacerbation: Secondary | ICD-10-CM

## 2023-10-11 DIAGNOSIS — J4489 Other specified chronic obstructive pulmonary disease: Secondary | ICD-10-CM | POA: Diagnosis not present

## 2023-10-11 DIAGNOSIS — N183 Chronic kidney disease, stage 3 unspecified: Secondary | ICD-10-CM

## 2023-10-11 DIAGNOSIS — F419 Anxiety disorder, unspecified: Secondary | ICD-10-CM | POA: Diagnosis not present

## 2023-10-11 DIAGNOSIS — R052 Subacute cough: Secondary | ICD-10-CM | POA: Diagnosis not present

## 2023-10-11 DIAGNOSIS — I2510 Atherosclerotic heart disease of native coronary artery without angina pectoris: Secondary | ICD-10-CM | POA: Diagnosis not present

## 2023-10-11 DIAGNOSIS — M5440 Lumbago with sciatica, unspecified side: Secondary | ICD-10-CM | POA: Diagnosis not present

## 2023-10-11 DIAGNOSIS — G8929 Other chronic pain: Secondary | ICD-10-CM

## 2023-10-11 MED ORDER — AZITHROMYCIN 250 MG PO TABS
ORAL_TABLET | ORAL | 0 refills | Status: DC
Start: 2023-10-11 — End: 2023-11-08

## 2023-10-11 MED ORDER — PANTOPRAZOLE SODIUM 40 MG PO TBEC: 40.0000 mg | DELAYED_RELEASE_TABLET | Freq: Every day | ORAL | 1 refills | Status: DC

## 2023-10-11 MED ORDER — METHYLPREDNISOLONE 4 MG PO TBPK: ORAL_TABLET | ORAL | 0 refills | Status: DC

## 2023-10-11 MED ORDER — PROMETHAZINE-DM 6.25-15 MG/5ML PO SYRP: 5.0000 mL | ORAL_SOLUTION | Freq: Four times a day (QID) | ORAL | 0 refills | Status: DC | PRN

## 2023-10-11 MED ORDER — HYDROCODONE BIT-HOMATROP MBR 5-1.5 MG/5ML PO SOLN: 5.0000 mL | Freq: Four times a day (QID) | ORAL | 0 refills | Status: AC | PRN

## 2023-10-11 NOTE — Patient Instructions (Signed)
 Do not use Hycodan syrup with Tramadol 

## 2023-10-11 NOTE — Assessment & Plan Note (Addendum)
 Do not use Hycodan syrup with Tramadol  Protonix  po every day x 1-2 mo Prolonged post-URI cough - severe ??whooping cough Doubt PE

## 2023-10-11 NOTE — Progress Notes (Signed)
 Subjective:  Patient ID: Larry Stone, male    DOB: 04-15-1951  Age: 73 y.o. MRN: [id number]  CC: Shortness of Breath (Nonproductive cough and sob . Winded when walking and talking. Went to Urgent care about month ago and has not been right since.)   HPI Larry Stone presents for cough and SOB x 5-6 weeks following URI in [place]. He went to UC, had steroids, abx. On Trelegy, HHN - not helping w/cough C/o shallow breathing  Outpatient Medications Prior to Visit  Medication Sig Dispense Refill   ALPRAZolam  (XANAX ) 0.5 MG tablet TAKE 1 TABLET BY MOUTH THREE TIMES A DAY AS NEEDED 270 tablet 0   ammonium lactate  (LAC-HYDRIN ) 12 % lotion Apply topically as directed. 400 mL 3   buPROPion  (WELLBUTRIN  SR) 150 MG 12 hr tablet TAKE 1 TABLET BY MOUTH TWICE A DAY 180 tablet 3   carbidopa -levodopa  (SINEMET  IR) 25-100 MG tablet Take 0.5 tablets by mouth 3 (three) times daily. 135 tablet 3   carvedilol  (COREG ) 25 MG tablet TAKE 1 TABLET BY MOUTH TWICE A DAY WITH FOOD 180 tablet 3   clopidogrel  (PLAVIX ) 75 MG tablet TAKE 1 TABLET BY MOUTH EVERY DAY 90 tablet 2   clotrimazole -betamethasone  (LOTRISONE ) cream APPLY AS DIRECTED 45 g 11   ezetimibe  (ZETIA ) 10 MG tablet Take 1 tablet (10 mg total) by mouth daily. 90 tablet 3   FLUoxetine  (PROZAC ) 10 MG tablet TAKE 1 TABLET BY MOUTH EVERY DAY 90 tablet 1   Fluticasone-Umeclidin-Vilant (TRELEGY ELLIPTA ) 100-62.5-25 MCG/ACT AEPB Inhale 1 puff into the lungs daily. (Patient taking differently: Inhale 1 puff into the lungs as needed.) 1 each 11   furosemide  (LASIX ) 20 MG tablet TAKE 1 TABLET (20 MG TOTAL) BY MOUTH DAILY AS NEEDED FOR FLUID OR EDEMA. 90 tablet 1   hydrALAZINE  (APRESOLINE ) 50 MG tablet TAKE 1 TABLET BY MOUTH FOUR TIMES A DAY 360 tablet 3   ipratropium-albuterol  (DUONEB) 0.5-2.5 (3) MG/3ML SOLN Take 3 mLs by nebulization every 4 (four) hours as needed (every 4-6 hours prn). Take by nebulization every 4-6 hours as needed 360 mL 2   meclizine   (ANTIVERT ) 25 MG tablet Take 0.5 tablets (12.5 mg total) by mouth 3 (three) times daily as needed for dizziness. 30 tablet 0   metFORMIN  (GLUCOPHAGE ) 500 MG tablet TAKE 1 TABLET BY MOUTH EVERY DAY WITH BREAKFAST 90 tablet 1   montelukast  (SINGULAIR ) 10 MG tablet TAKE 1 TABLET BY MOUTH EVERYDAY AT BEDTIME 90 tablet 3   nitroGLYCERIN  (NITROSTAT ) 0.4 MG SL tablet PLACE 1 TABLET UNDER THE TONGUE EVERY 5 MINUTES AS NEEDED FOR CHEST PAIN. UP TO 3 DOSES. 75 tablet 1   pravastatin  (PRAVACHOL ) 40 MG tablet TAKE 1 TABLET BY MOUTH EVERY DAY 90 tablet 3   tamsulosin  (FLOMAX ) 0.4 MG CAPS capsule TAKE 1 CAPSULE BY MOUTH EVERY DAY 90 capsule 3   traMADol  (ULTRAM ) 50 MG tablet TAKE 1 TABLET BY MOUTH EVERY 6 HOURS AS NEEDED. 120 tablet 1   No facility-administered medications prior to visit.    ROS: Review of Systems  Constitutional:  Positive for fatigue. Negative for appetite change and unexpected weight change.  HENT:  Negative for congestion, nosebleeds, sneezing, sore throat and trouble swallowing.   Eyes:  Negative for itching and visual disturbance.  Respiratory:  Positive for apnea, cough and shortness of breath. Negative for wheezing.   Cardiovascular:  Negative for chest pain, palpitations and leg swelling.  Gastrointestinal:  Negative for abdominal distention, blood in stool, diarrhea and  nausea.  Genitourinary:  Negative for frequency and hematuria.  Musculoskeletal:  Positive for arthralgias. Negative for back pain, gait problem, joint swelling and neck pain.  Skin:  Negative for rash.  Neurological:  Negative for dizziness, tremors, speech difficulty and weakness.  Psychiatric/Behavioral:  Negative for agitation, dysphoric mood, sleep disturbance and suicidal ideas. The patient is not nervous/anxious.     Objective:  BP 130/70   Pulse (!) 59   Temp 97.9 F (36.6 C) (Oral)   Ht 5' 8 (1.727 m)   Wt 191 lb 9.6 oz (86.9 kg)   SpO2 98%   BMI 29.13 kg/m   BP Readings from Last 3  Encounters:  10/11/23 130/70  10/10/23 (!) 152/72  06/17/23 100/60    Wt Readings from Last 3 Encounters:  10/11/23 191 lb 9.6 oz (86.9 kg)  10/10/23 190 lb (86.2 kg)  06/17/23 191 lb (86.6 kg)    Physical Exam Constitutional:      General: He is not in acute distress.    Appearance: He is well-developed. He is obese.     Comments: NAD   Eyes:     Conjunctiva/sclera: Conjunctivae normal.     Pupils: Pupils are equal, round, and reactive to light.   Neck:     Thyroid : No thyromegaly.     Vascular: No JVD.   Cardiovascular:     Rate and Rhythm: Normal rate and regular rhythm.     Heart sounds: Normal heart sounds. No murmur heard.    No friction rub. No gallop.  Pulmonary:     Effort: Pulmonary effort is normal. No respiratory distress.     Breath sounds: Normal breath sounds. No wheezing or rales.  Chest:     Chest wall: No tenderness.  Abdominal:     General: Bowel sounds are normal. There is no distension.     Palpations: Abdomen is soft. There is no mass.     Tenderness: There is no abdominal tenderness. There is no guarding or rebound.   Musculoskeletal:        General: No tenderness. Normal range of motion.     Cervical back: Normal range of motion.  Lymphadenopathy:     Cervical: No cervical adenopathy.   Skin:    General: Skin is warm and dry.     Findings: No rash.   Neurological:     Mental Status: He is alert and oriented to person, place, and time.     Cranial Nerves: No cranial nerve deficit.     Motor: No abnormal muscle tone.     Coordination: Coordination normal.     Gait: Gait normal.     Deep Tendon Reflexes: Reflexes are normal and symmetric.   Psychiatric:        Behavior: Behavior normal.        Thought Content: Thought content normal.        Judgment: Judgment normal.   NAD  Lab Results  Component Value Date   WBC 8.8 07/28/2023   HGB 13.5 07/28/2023   HCT 40.7 07/28/2023   PLT 152.0 07/28/2023   GLUCOSE 82 07/28/2023   CHOL  123 07/28/2023   TRIG 58.0 07/28/2023   HDL 48.80 07/28/2023   LDLCALC 63 07/28/2023   ALT 5 07/28/2023   AST 17 07/28/2023   NA 139 07/28/2023   K 4.5 07/28/2023   CL 104 07/28/2023   CREATININE 1.41 07/28/2023   BUN 21 07/28/2023   CO2 29 07/28/2023   TSH 2.05 07/28/2023  PSA 0.33 01/11/2022   INR 1.1 12/04/2019   HGBA1C 5.6 07/28/2023    VAS US  CAROTID Result Date: 06/02/2022 Carotid Arterial Duplex Study Patient Name:  Larry Stone  Date of Exam:   06/02/2022 Medical Rec #: [id number]        Accession #:    [id number] Date of Birth: February 11, 1951         Patient Gender: M Patient Age:   37 years Exam Location:  [facility] Procedure:      VAS US  CAROTID Referring Phys: PRAMOD SETHI --------------------------------------------------------------------------------  Indications:       History of CVA, follow up. Risk Factors:      Hypertension, hyperlipidemia, past history of smoking, prior                    MI, coronary artery disease, prior CVA. Comparison Study:  12-10-2019 Carotid duplex showed 1-39% bilateral ICA Performing Technologist: Vernell Iba RDMS, RVT  Examination Guidelines: A complete evaluation includes B-mode imaging, spectral Doppler, color Doppler, and power Doppler as needed of all accessible portions of each vessel. Bilateral testing is considered an integral part of a complete examination. Limited examinations for reoccurring indications may be performed as noted.  Right Carotid Findings: +----------+--------+--------+--------+------------------+--------+           PSV cm/sEDV cm/sStenosisPlaque DescriptionComments +----------+--------+--------+--------+------------------+--------+ CCA Prox  85      16              calcific                   +----------+--------+--------+--------+------------------+--------+ CCA Distal63      17              calcific                   +----------+--------+--------+--------+------------------+--------+ ICA Prox  80       22      1-39%   calcific                   +----------+--------+--------+--------+------------------+--------+ ICA Mid   125     32                                         +----------+--------+--------+--------+------------------+--------+ ICA Distal70      21                                         +----------+--------+--------+--------+------------------+--------+ ECA       125     17              calcific                   +----------+--------+--------+--------+------------------+--------+ +----------+--------+-------+----------------+-------------------+           PSV cm/sEDV cmsDescribe        Arm Pressure (mmHG) +----------+--------+-------+----------------+-------------------+ [id number]            Multiphasic, WNL                    +----------+--------+-------+----------------+-------------------+ +---------+--------+--+--------+--+---------+ VertebralPSV cm/s44EDV cm/s13Antegrade +---------+--------+--+--------+--+---------+  Left Carotid Findings: +----------+--------+--------+--------+------------------+------------------+           PSV cm/sEDV cm/sStenosisPlaque DescriptionComments           +----------+--------+--------+--------+------------------+------------------+ CCA Prox  103  22                                                   +----------+--------+--------+--------+------------------+------------------+ CCA Distal74      16                                intimal thickening +----------+--------+--------+--------+------------------+------------------+ ICA Prox  85      27      1-39%   calcific                             +----------+--------+--------+--------+------------------+------------------+ ICA Mid   93      29                                                   +----------+--------+--------+--------+------------------+------------------+ ICA Distal88      29                                                    +----------+--------+--------+--------+------------------+------------------+ ECA       119     14                                                   +----------+--------+--------+--------+------------------+------------------+ +----------+--------+--------+----------------+-------------------+           PSV cm/sEDV cm/sDescribe        Arm Pressure (mmHG) +----------+--------+--------+----------------+-------------------+ [id number]             Multiphasic, WNL                    +----------+--------+--------+----------------+-------------------+ +---------+--------+--+--------+--+---------+ VertebralPSV cm/s47EDV cm/s14Antegrade +---------+--------+--+--------+--+---------+   Summary: Right Carotid: Velocities in the right ICA are consistent with a 1-39% stenosis. Left Carotid: Velocities in the left ICA are consistent with a 1-39% stenosis. Vertebrals:  Bilateral vertebral arteries demonstrate antegrade flow. Subclavians: Normal flow hemodynamics were seen in bilateral subclavian              arteries. *See table(s) above for measurements and observations.  Electronically signed by Eather Popp MD on 06/02/2022 at 4:01:09 PM.    Final     Assessment & Plan:   Problem List Items Addressed This Visit     Anxiety disorder   Will continue on Abilify  to 5 mg/d. It is helping      LOW BACK PAIN SYNDROME   Relevant Medications   methylPREDNISolone  (MEDROL  DOSEPAK) 4 MG TBPK tablet   CAD (coronary artery disease)   No CP      COPD with chronic bronchitis (HCC)   Prolonged post-URI cough - severe ??whooping cough Hycodan prn or Prom cough syr Zpac Medrol  pack CXR Do not use Hycodan syrup with Tramadol  Do not use Hycodan syrup with Tramadol  Protonix  po every day x 1-2 mo      Relevant Medications  promethazine-dextromethorphan (PROMETHAZINE-DM) 6.25-15 MG/5ML syrup   HYDROcodone  bit-homatropine (HYCODAN) 5-1.5 MG/5ML syrup   methylPREDNISolone  (MEDROL   DOSEPAK) 4 MG TBPK tablet   azithromycin  (ZITHROMAX  Z-PAK) 250 MG tablet   Other Relevant Orders   DG Chest 2 View   COPD with acute exacerbation (HCC)   Relevant Medications   promethazine-dextromethorphan (PROMETHAZINE-DM) 6.25-15 MG/5ML syrup   HYDROcodone  bit-homatropine (HYCODAN) 5-1.5 MG/5ML syrup   methylPREDNISolone  (MEDROL  DOSEPAK) 4 MG TBPK tablet   azithromycin  (ZITHROMAX  Z-PAK) 250 MG tablet   Chronic renal insufficiency, stage 3 (moderate) (HCC)   Cont with Coreg , Hydralazine  Hydrate well      Cough - Primary   Do not use Hycodan syrup with Tramadol  Protonix  po every day x 1-2 mo Prolonged post-URI cough - severe ??whooping cough Doubt PE      Relevant Orders   DG Chest 2 View      Meds ordered this encounter  Medications   promethazine-dextromethorphan (PROMETHAZINE-DM) 6.25-15 MG/5ML syrup    Sig: Take 5 mLs by mouth 4 (four) times daily as needed for cough.    Dispense:  240 mL    Refill:  0   HYDROcodone  bit-homatropine (HYCODAN) 5-1.5 MG/5ML syrup    Sig: Take 5 mLs by mouth every 6 (six) hours as needed for up to 10 days for cough.    Dispense:  240 mL    Refill:  0   methylPREDNISolone  (MEDROL  DOSEPAK) 4 MG TBPK tablet    Sig: As directed    Dispense:  21 tablet    Refill:  0   azithromycin  (ZITHROMAX  Z-PAK) 250 MG tablet    Sig: As directed    Dispense:  6 tablet    Refill:  0   pantoprazole  (PROTONIX ) 40 MG tablet    Sig: Take 1 tablet (40 mg total) by mouth daily.    Dispense:  30 tablet    Refill:  1      Follow-up: Return in about 4 weeks (around 11/08/2023) for a follow-up visit.  Marolyn Noel, MD

## 2023-10-11 NOTE — Assessment & Plan Note (Addendum)
 Prolonged post-URI cough - severe ??whooping cough Hycodan prn or Prom cough syr Zpac Medrol  pack CXR Do not use Hycodan syrup with Tramadol  Do not use Hycodan syrup with Tramadol  Protonix  po every day x 1-2 mo

## 2023-10-11 NOTE — Assessment & Plan Note (Signed)
 No CP

## 2023-10-11 NOTE — Assessment & Plan Note (Signed)
Cont with Coreg, Hydralazine ?Hydrate well ?

## 2023-10-11 NOTE — Assessment & Plan Note (Signed)
 Will continue on Abilify to 5 mg/d. It is helping

## 2023-10-12 ENCOUNTER — Ambulatory Visit: Payer: Self-pay | Admitting: Internal Medicine

## 2023-10-17 ENCOUNTER — Ambulatory Visit: Payer: Medicare Other | Admitting: Internal Medicine

## 2023-10-24 ENCOUNTER — Other Ambulatory Visit: Payer: Self-pay | Admitting: Internal Medicine

## 2023-10-28 ENCOUNTER — Other Ambulatory Visit: Payer: Self-pay | Admitting: Family

## 2023-10-31 ENCOUNTER — Ambulatory Visit: Admitting: Podiatry

## 2023-11-02 NOTE — Telephone Encounter (Signed)
 Copied from CRM [id number]. Topic: Clinical - Prescription Issue >> Nov 02, 2023  1:33 PM Frederich PARAS wrote: Reason for CRM: prescripion for alprzolam 0.5mg  refill request was submitted on 7/11 and it still hasn't been filled and it's been 3 business days already

## 2023-11-03 ENCOUNTER — Other Ambulatory Visit: Payer: Self-pay | Admitting: Internal Medicine

## 2023-11-04 NOTE — Telephone Encounter (Signed)
 Copied from CRM [id number]. Topic: Clinical - Medication Question >> Nov 04, 2023 10:33 AM Revonda D wrote: Reason for CRM: Pt stated that the pharmacy has been trying to get the medication refilled for the ALPRAZolam  (XANAX ) 0.5 MG tablet but hasn't received a response from the office. I informed the pt that the medication is currently pending. Pt stated that he would like for the medication to be approved today.

## 2023-11-08 ENCOUNTER — Encounter: Payer: Self-pay | Admitting: Internal Medicine

## 2023-11-08 ENCOUNTER — Ambulatory Visit: Admitting: Internal Medicine

## 2023-11-08 ENCOUNTER — Ambulatory Visit: Payer: Self-pay | Admitting: Internal Medicine

## 2023-11-08 VITALS — BP 128/71 | HR 55 | Temp 97.5°F | Ht 68.0 in | Wt 194.0 lb

## 2023-11-08 DIAGNOSIS — R052 Subacute cough: Secondary | ICD-10-CM

## 2023-11-08 DIAGNOSIS — G8191 Hemiplegia, unspecified affecting right dominant side: Secondary | ICD-10-CM | POA: Diagnosis not present

## 2023-11-08 DIAGNOSIS — J4489 Other specified chronic obstructive pulmonary disease: Secondary | ICD-10-CM

## 2023-11-08 DIAGNOSIS — N183 Chronic kidney disease, stage 3 unspecified: Secondary | ICD-10-CM

## 2023-11-08 DIAGNOSIS — R739 Hyperglycemia, unspecified: Secondary | ICD-10-CM

## 2023-11-08 DIAGNOSIS — J410 Simple chronic bronchitis: Secondary | ICD-10-CM

## 2023-11-08 LAB — COMPREHENSIVE METABOLIC PANEL WITH GFR
ALT: 6 U/L (ref 0–53)
AST: 18 U/L (ref 0–37)
Albumin: 4 g/dL (ref 3.5–5.2)
Alkaline Phosphatase: 63 U/L (ref 39–117)
BUN: 20 mg/dL (ref 6–23)
CO2: 31 meq/L (ref 19–32)
Calcium: 9.2 mg/dL (ref 8.4–10.5)
Chloride: 103 meq/L (ref 96–112)
Creatinine, Ser: 1.3 mg/dL (ref 0.40–1.50)
GFR: 54.52 mL/min — ABNORMAL LOW (ref >60.00)
Glucose, Bld: 81 mg/dL (ref 70–99)
Potassium: 4.1 meq/L (ref 3.5–5.1)
Sodium: 139 meq/L (ref 135–145)
Total Bilirubin: 0.8 mg/dL (ref 0.2–1.2)
Total Protein: 6.6 g/dL (ref 6.0–8.3)

## 2023-11-08 LAB — HEMOGLOBIN A1C: Hgb A1c MFr Bld: 5.7 % (ref 4.6–6.5)

## 2023-11-08 NOTE — Assessment & Plan Note (Signed)
Cont with Coreg, Hydralazine ?Hydrate well ?

## 2023-11-08 NOTE — Progress Notes (Signed)
 Subjective:  Patient ID: Larry Stone, male    DOB: 04-May-1950  Age: 73 y.o. MRN: [id number]  CC: Medical Management of Chronic Issues (4 week f/u )   HPI Larry Stone presents for cough - 95% better, h/o CVA, HTN, CRI  Outpatient Medications Prior to Visit  Medication Sig Dispense Refill   ALPRAZolam  (XANAX ) 0.5 MG tablet TAKE 1 TABLET BY MOUTH THREE TIMES A DAY AS NEEDED 270 tablet 0   ammonium lactate  (LAC-HYDRIN ) 12 % lotion Apply topically as directed. 400 mL 3   amoxicillin  (AMOXIL ) 500 MG capsule Take 500 mg by mouth 3 (three) times daily.     buPROPion  (WELLBUTRIN  SR) 150 MG 12 hr tablet TAKE 1 TABLET BY MOUTH TWICE A DAY 180 tablet 3   carbidopa -levodopa  (SINEMET  IR) 25-100 MG tablet Take 0.5 tablets by mouth 3 (three) times daily. 135 tablet 3   carvedilol  (COREG ) 25 MG tablet TAKE 1 TABLET BY MOUTH TWICE A DAY WITH FOOD 180 tablet 3   clopidogrel  (PLAVIX ) 75 MG tablet TAKE 1 TABLET BY MOUTH EVERY DAY 90 tablet 2   clotrimazole -betamethasone  (LOTRISONE ) cream APPLY AS DIRECTED 45 g 11   ezetimibe  (ZETIA ) 10 MG tablet Take 1 tablet (10 mg total) by mouth daily. 90 tablet 3   furosemide  (LASIX ) 20 MG tablet TAKE 1 TABLET (20 MG TOTAL) BY MOUTH DAILY AS NEEDED FOR FLUID OR EDEMA. 90 tablet 1   hydrALAZINE  (APRESOLINE ) 50 MG tablet TAKE 1 TABLET BY MOUTH FOUR TIMES A DAY 360 tablet 3   ipratropium-albuterol  (DUONEB) 0.5-2.5 (3) MG/3ML SOLN Take 3 mLs by nebulization every 4 (four) hours as needed (every 4-6 hours prn). Take by nebulization every 4-6 hours as needed 360 mL 2   meclizine  (ANTIVERT ) 25 MG tablet Take 0.5 tablets (12.5 mg total) by mouth 3 (three) times daily as needed for dizziness. 30 tablet 0   metFORMIN  (GLUCOPHAGE ) 500 MG tablet TAKE 1 TABLET BY MOUTH EVERY DAY WITH BREAKFAST 90 tablet 1   montelukast  (SINGULAIR ) 10 MG tablet TAKE 1 TABLET BY MOUTH EVERYDAY AT BEDTIME 90 tablet 3   nitroGLYCERIN  (NITROSTAT ) 0.4 MG SL tablet PLACE 1 TABLET UNDER THE TONGUE  EVERY 5 MINUTES AS NEEDED FOR CHEST PAIN. UP TO 3 DOSES. 75 tablet 1   pantoprazole  (PROTONIX ) 40 MG tablet TAKE 1 TABLET BY MOUTH EVERY DAY 90 tablet 1   pravastatin  (PRAVACHOL ) 40 MG tablet TAKE 1 TABLET BY MOUTH EVERY DAY 90 tablet 3   tamsulosin  (FLOMAX ) 0.4 MG CAPS capsule TAKE 1 CAPSULE BY MOUTH EVERY DAY 90 capsule 3   traMADol  (ULTRAM ) 50 MG tablet TAKE 1 TABLET BY MOUTH EVERY 6 HOURS AS NEEDED. 120 tablet 1   TRELEGY ELLIPTA  100-62.5-25 MCG/ACT AEPB INHALE 1 PUFF INTO THE LUNGS DAILY 60 each 11   azithromycin  (ZITHROMAX  Z-PAK) 250 MG tablet As directed 6 tablet 0   FLUoxetine  (PROZAC ) 10 MG tablet TAKE 1 TABLET BY MOUTH EVERY DAY 90 tablet 1   methylPREDNISolone  (MEDROL  DOSEPAK) 4 MG TBPK tablet As directed 21 tablet 0   promethazine -dextromethorphan (PROMETHAZINE -DM) 6.25-15 MG/5ML syrup Take 5 mLs by mouth 4 (four) times daily as needed for cough. 240 mL 0   No facility-administered medications prior to visit.    ROS: Review of Systems  Constitutional:  Negative for appetite change, fatigue and unexpected weight change.  HENT:  Negative for congestion, nosebleeds, sneezing, sore throat and trouble swallowing.   Eyes:  Negative for itching and visual disturbance.  Respiratory:  Positive for cough. Negative for shortness of breath.   Cardiovascular:  Negative for chest pain, palpitations and leg swelling.  Gastrointestinal:  Negative for abdominal distention, blood in stool, diarrhea and nausea.  Genitourinary:  Negative for frequency and hematuria.  Musculoskeletal:  Negative for back pain, gait problem, joint swelling and neck pain.  Skin:  Negative for rash.  Neurological:  Negative for dizziness, tremors, speech difficulty and weakness.  Psychiatric/Behavioral:  Negative for agitation, dysphoric mood and sleep disturbance. The patient is not nervous/anxious.     Objective:  BP 128/71   Pulse (!) 55   Temp (!) 97.5 F (36.4 C) (Oral)   Ht 5' 8 (1.727 m)   Wt 194 lb (88  kg)   SpO2 97%   BMI 29.50 kg/m   BP Readings from Last 3 Encounters:  11/08/23 128/71  10/11/23 130/70  10/10/23 (!) 152/72    Wt Readings from Last 3 Encounters:  11/08/23 194 lb (88 kg)  10/11/23 191 lb 9.6 oz (86.9 kg)  10/10/23 190 lb (86.2 kg)    Physical Exam Constitutional:      General: He is not in acute distress.    Appearance: He is well-developed. He is obese.     Comments: NAD  Eyes:     Conjunctiva/sclera: Conjunctivae normal.     Pupils: Pupils are equal, round, and reactive to light.  Neck:     Thyroid : No thyromegaly.     Vascular: No JVD.  Cardiovascular:     Rate and Rhythm: Normal rate and regular rhythm.     Heart sounds: Normal heart sounds. No murmur heard.    No friction rub. No gallop.  Pulmonary:     Effort: Pulmonary effort is normal. No respiratory distress.     Breath sounds: Normal breath sounds. No wheezing or rales.  Chest:     Chest wall: No tenderness.  Abdominal:     General: Bowel sounds are normal. There is no distension.     Palpations: Abdomen is soft. There is no mass.     Tenderness: There is no abdominal tenderness. There is no guarding or rebound.  Musculoskeletal:        General: No tenderness. Normal range of motion.     Cervical back: Normal range of motion.  Lymphadenopathy:     Cervical: No cervical adenopathy.  Skin:    General: Skin is warm and dry.     Findings: No rash.  Neurological:     Mental Status: He is alert and oriented to person, place, and time.     Cranial Nerves: No cranial nerve deficit.     Motor: No abnormal muscle tone.     Coordination: Coordination normal.     Gait: Gait normal.     Deep Tendon Reflexes: Reflexes are normal and symmetric.  Psychiatric:        Behavior: Behavior normal.        Thought Content: Thought content normal.        Judgment: Judgment normal.     Lab Results  Component Value Date   WBC 8.8 07/28/2023   HGB 13.5 07/28/2023   HCT 40.7 07/28/2023   PLT 152.0  07/28/2023   GLUCOSE 82 07/28/2023   CHOL 123 07/28/2023   TRIG 58.0 07/28/2023   HDL 48.80 07/28/2023   LDLCALC 63 07/28/2023   ALT 5 07/28/2023   AST 17 07/28/2023   NA 139 07/28/2023   K 4.5 07/28/2023   CL 104 07/28/2023  CREATININE 1.41 07/28/2023   BUN 21 07/28/2023   CO2 29 07/28/2023   TSH 2.05 07/28/2023   PSA 0.33 01/11/2022   INR 1.1 12/04/2019   HGBA1C 5.6 07/28/2023    VAS US  CAROTID Result Date: 06/02/2022 Carotid Arterial Duplex Study Patient Name:  QUIRINO KAKOS  Date of Exam:   06/02/2022 Medical Rec #: [id number]        Accession #:    [id number] Date of Birth: 07/10/1950         Patient Gender: M Patient Age:   36 years Exam Location:  [facility] Procedure:      VAS US  CAROTID Referring Phys: PRAMOD SETHI --------------------------------------------------------------------------------  Indications:       History of CVA, follow up. Risk Factors:      Hypertension, hyperlipidemia, past history of smoking, prior                    MI, coronary artery disease, prior CVA. Comparison Study:  12-10-2019 Carotid duplex showed 1-39% bilateral ICA Performing Technologist: Vernell Iba RDMS, RVT  Examination Guidelines: A complete evaluation includes B-mode imaging, spectral Doppler, color Doppler, and power Doppler as needed of all accessible portions of each vessel. Bilateral testing is considered an integral part of a complete examination. Limited examinations for reoccurring indications may be performed as noted.  Right Carotid Findings: +----------+--------+--------+--------+------------------+--------+           PSV cm/sEDV cm/sStenosisPlaque DescriptionComments +----------+--------+--------+--------+------------------+--------+ CCA Prox  85      16              calcific                   +----------+--------+--------+--------+------------------+--------+ CCA Distal63      17              calcific                    +----------+--------+--------+--------+------------------+--------+ ICA Prox  80      22      1-39%   calcific                   +----------+--------+--------+--------+------------------+--------+ ICA Mid   125     32                                         +----------+--------+--------+--------+------------------+--------+ ICA Distal70      21                                         +----------+--------+--------+--------+------------------+--------+ ECA       125     17              calcific                   +----------+--------+--------+--------+------------------+--------+ +----------+--------+-------+----------------+-------------------+           PSV cm/sEDV cmsDescribe        Arm Pressure (mmHG) +----------+--------+-------+----------------+-------------------+ [id number]            Multiphasic, WNL                    +----------+--------+-------+----------------+-------------------+ +---------+--------+--+--------+--+---------+ VertebralPSV cm/s44EDV cm/s13Antegrade +---------+--------+--+--------+--+---------+  Left Carotid Findings: +----------+--------+--------+--------+------------------+------------------+           PSV  cm/sEDV cm/sStenosisPlaque DescriptionComments           +----------+--------+--------+--------+------------------+------------------+ CCA Prox  103     22                                                   +----------+--------+--------+--------+------------------+------------------+ CCA Distal74      16                                intimal thickening +----------+--------+--------+--------+------------------+------------------+ ICA Prox  85      27      1-39%   calcific                             +----------+--------+--------+--------+------------------+------------------+ ICA Mid   93      29                                                    +----------+--------+--------+--------+------------------+------------------+ ICA Distal88      29                                                   +----------+--------+--------+--------+------------------+------------------+ ECA       119     14                                                   +----------+--------+--------+--------+------------------+------------------+ +----------+--------+--------+----------------+-------------------+           PSV cm/sEDV cm/sDescribe        Arm Pressure (mmHG) +----------+--------+--------+----------------+-------------------+ [id number]             Multiphasic, WNL                    +----------+--------+--------+----------------+-------------------+ +---------+--------+--+--------+--+---------+ VertebralPSV cm/s47EDV cm/s14Antegrade +---------+--------+--+--------+--+---------+   Summary: Right Carotid: Velocities in the right ICA are consistent with a 1-39% stenosis. Left Carotid: Velocities in the left ICA are consistent with a 1-39% stenosis. Vertebrals:  Bilateral vertebral arteries demonstrate antegrade flow. Subclavians: Normal flow hemodynamics were seen in bilateral subclavian              arteries. *See table(s) above for measurements and observations.  Electronically signed by Eather Popp MD on 06/02/2022 at 4:01:09 PM.    Final     Assessment & Plan:   Problem List Items Addressed This Visit     BRONCHITIS, RECURRENT    Cough is 95% better Cont on Trelegy      Chronic renal insufficiency, stage 3 (moderate) (HCC)   Cont with Coreg , Hydralazine  Hydrate well      Relevant Orders   Comprehensive metabolic panel with GFR   COPD with chronic bronchitis (HCC)    Cough is 95% better Cont on Trelegy      Cough - Primary    Cough is 95% better Cont on Trelegy  Hyperglycemia   Check A1c      Relevant Orders   Comprehensive metabolic panel with GFR   Hemoglobin A1c   Right hemiparesis (HCC)   Doing  well         No orders of the defined types were placed in this encounter.     Follow-up: Return in about 3 months (around 02/08/2024) for a follow-up visit.  Marolyn Noel, MD

## 2023-11-08 NOTE — Assessment & Plan Note (Signed)
 Cough is 95% better Cont on Trelegy

## 2023-11-08 NOTE — Patient Instructions (Signed)
 [facility]  -- Regular price $270.00 USD

## 2023-11-08 NOTE — Assessment & Plan Note (Signed)
 Doing well

## 2023-11-08 NOTE — Assessment & Plan Note (Signed)
 Check A1c.

## 2023-11-14 ENCOUNTER — Ambulatory Visit: Admitting: Podiatry

## 2023-11-14 ENCOUNTER — Encounter: Payer: Self-pay | Admitting: Podiatry

## 2023-11-14 DIAGNOSIS — B351 Tinea unguium: Secondary | ICD-10-CM | POA: Diagnosis not present

## 2023-11-14 DIAGNOSIS — M79674 Pain in right toe(s): Secondary | ICD-10-CM | POA: Diagnosis not present

## 2023-11-14 DIAGNOSIS — M79675 Pain in left toe(s): Secondary | ICD-10-CM | POA: Diagnosis not present

## 2023-11-14 DIAGNOSIS — N183 Chronic kidney disease, stage 3 unspecified: Secondary | ICD-10-CM

## 2023-11-14 NOTE — Progress Notes (Signed)
This patient presents to the office with chief complaint of long thick painful nails.  Patient says the nails are painful walking and wearing shoes.  This patient is unable to self treat.  This patient is unable to trim his nails since he is unable to reach his  nails.  She presents to the office for preventative foot care services.  General Appearance  Alert, conversant and in no acute stress.  Vascular  Dorsalis pedis and posterior tibial  pulses are palpable  bilaterally.  Capillary return is within normal limits  bilaterally. Temperature is within normal limits  bilaterally.  Neurologic  Senn-Weinstein monofilament wire test within normal limits  bilaterally. Muscle power within normal limits bilaterally.  Nails Thick disfigured discolored nails with subungual debris  from hallux to fifth toes bilaterally. No evidence of bacterial infection or drainage bilaterally.  Orthopedic  No limitations of motion  feet .  No crepitus or effusions noted.  No bony pathology or digital deformities noted.  Skin  normotropic skin with no porokeratosis noted bilaterally.  No signs of infections or ulcers noted.     Onychomycosis  Nails  B/L.  Pain in right toes  Pain in left toes  Debridement of nails both feet followed trimming the nails with dremel tool.    RTC 3 months.   Gardiner Barefoot DPM

## 2023-12-18 ENCOUNTER — Other Ambulatory Visit: Payer: Self-pay | Admitting: Internal Medicine

## 2023-12-27 ENCOUNTER — Other Ambulatory Visit: Payer: Self-pay | Admitting: Internal Medicine

## 2023-12-27 NOTE — Telephone Encounter (Signed)
 Medication: Tramadol   Directions: TAKE 1 TABLET BY MOUTH EVERY 6 HOURS AS NEEDED.  Last given: 09/05/2023 Number refills: 1 Last o/v: 11/08/2023 Follow up: N/A Labs:  07/28/2023  Please review refill request.

## 2024-02-08 ENCOUNTER — Encounter: Payer: Self-pay | Admitting: Internal Medicine

## 2024-02-08 ENCOUNTER — Ambulatory Visit: Admitting: Internal Medicine

## 2024-02-08 VITALS — BP 126/66 | HR 58 | Temp 98.0°F | Ht 68.0 in | Wt 193.4 lb

## 2024-02-08 DIAGNOSIS — F419 Anxiety disorder, unspecified: Secondary | ICD-10-CM

## 2024-02-08 DIAGNOSIS — R739 Hyperglycemia, unspecified: Secondary | ICD-10-CM

## 2024-02-08 DIAGNOSIS — R202 Paresthesia of skin: Secondary | ICD-10-CM | POA: Diagnosis not present

## 2024-02-08 DIAGNOSIS — N2889 Other specified disorders of kidney and ureter: Secondary | ICD-10-CM

## 2024-02-08 DIAGNOSIS — M5440 Lumbago with sciatica, unspecified side: Secondary | ICD-10-CM

## 2024-02-08 DIAGNOSIS — G8929 Other chronic pain: Secondary | ICD-10-CM

## 2024-02-08 MED ORDER — B COMPLEX VITAMINS PO CAPS: 1.0000 | ORAL_CAPSULE | Freq: Every day | ORAL | 3 refills | Status: AC

## 2024-02-08 MED ORDER — GABAPENTIN 100 MG PO CAPS: ORAL_CAPSULE | ORAL | 5 refills | Status: AC

## 2024-02-08 MED ORDER — IPRATROPIUM-ALBUTEROL 0.5-2.5 (3) MG/3ML IN SOLN: 3.0000 mL | RESPIRATORY_TRACT | 2 refills | Status: AC | PRN

## 2024-02-08 NOTE — Assessment & Plan Note (Signed)
Cont on Wellbutrin Xanax prn  Potential benefits of a long term benzodiazepines  use as well as potential risks  and complications were explained to the patient and were aknowledged.

## 2024-02-08 NOTE — Assessment & Plan Note (Signed)
 Numbness in the R hand and R foot  after CVA - worse Will try Gabapentin  prn low dose Vit B complex

## 2024-02-08 NOTE — Assessment & Plan Note (Signed)
 Recurrent - MSK Tramadol  prn  Potential benefits of a long term opioids use as well as potential risks (i.e. addiction risk, apnea etc) and complications (i.e. Somnolence, constipation and others) were explained to the patient and were aknowledged.

## 2024-02-08 NOTE — Progress Notes (Signed)
 Subjective:  Patient ID: Larry Stone, male    DOB: Dec 05, 1950  Age: 72 y.o. MRN: [id number]  CC: Medical Management of Chronic Issues (3 month follow up. Patient notes some worsening right peripheral numbness since last seeing us  in the office)   HPI Larry Stone presents for hand numbness in the R hand and R foot  after CVA- worse;  cough - resolved, h/o CVA, HTN, CRI   Outpatient Medications Prior to Visit  Medication Sig Dispense Refill   ALPRAZolam  (XANAX ) 0.5 MG tablet TAKE 1 TABLET BY MOUTH THREE TIMES A DAY AS NEEDED 270 tablet 0   ammonium lactate  (LAC-HYDRIN ) 12 % lotion Apply topically as directed. 400 mL 3   buPROPion  (WELLBUTRIN  SR) 150 MG 12 hr tablet TAKE 1 TABLET BY MOUTH TWICE A DAY 180 tablet 3   carbidopa -levodopa  (SINEMET  IR) 25-100 MG tablet Take 0.5 tablets by mouth 3 (three) times daily. 135 tablet 3   carvedilol  (COREG ) 25 MG tablet TAKE 1 TABLET BY MOUTH TWICE A DAY WITH FOOD 180 tablet 3   clopidogrel  (PLAVIX ) 75 MG tablet TAKE 1 TABLET BY MOUTH EVERY DAY 90 tablet 2   clotrimazole -betamethasone  (LOTRISONE ) cream APPLY AS DIRECTED 45 g 11   ezetimibe  (ZETIA ) 10 MG tablet Take 1 tablet (10 mg total) by mouth daily. 90 tablet 3   furosemide  (LASIX ) 20 MG tablet TAKE 1 TABLET (20 MG TOTAL) BY MOUTH DAILY AS NEEDED FOR FLUID OR EDEMA. 90 tablet 1   hydrALAZINE  (APRESOLINE ) 50 MG tablet TAKE 1 TABLET BY MOUTH FOUR TIMES A DAY 360 tablet 3   meclizine  (ANTIVERT ) 25 MG tablet Take 0.5 tablets (12.5 mg total) by mouth 3 (three) times daily as needed for dizziness. 30 tablet 0   metFORMIN  (GLUCOPHAGE ) 500 MG tablet TAKE 1 TABLET BY MOUTH EVERY DAY WITH BREAKFAST 90 tablet 1   montelukast  (SINGULAIR ) 10 MG tablet TAKE 1 TABLET BY MOUTH EVERYDAY AT BEDTIME 90 tablet 3   nitroGLYCERIN  (NITROSTAT ) 0.4 MG SL tablet PLACE 1 TABLET UNDER THE TONGUE EVERY 5 MINUTES AS NEEDED FOR CHEST PAIN. UP TO 3 DOSES. 75 tablet 1   pantoprazole  (PROTONIX ) 40 MG tablet TAKE 1 TABLET BY  MOUTH EVERY DAY 90 tablet 1   pravastatin  (PRAVACHOL ) 40 MG tablet TAKE 1 TABLET BY MOUTH EVERY DAY 90 tablet 3   tamsulosin  (FLOMAX ) 0.4 MG CAPS capsule TAKE 1 CAPSULE BY MOUTH EVERY DAY 90 capsule 3   traMADol  (ULTRAM ) 50 MG tablet TAKE 1 TABLET BY MOUTH EVERY 6 HOURS AS NEEDED 120 tablet 1   TRELEGY ELLIPTA  100-62.5-25 MCG/ACT AEPB INHALE 1 PUFF INTO THE LUNGS DAILY 60 each 11   ipratropium-albuterol  (DUONEB) 0.5-2.5 (3) MG/3ML SOLN Take 3 mLs by nebulization every 4 (four) hours as needed (every 4-6 hours prn). Take by nebulization every 4-6 hours as needed 360 mL 2   amoxicillin  (AMOXIL ) 500 MG capsule Take 500 mg by mouth 3 (three) times daily.     No facility-administered medications prior to visit.    ROS: Review of Systems  Constitutional:  Negative for appetite change, fatigue and unexpected weight change.  HENT:  Negative for congestion, nosebleeds, sneezing, sore throat and trouble swallowing.   Eyes:  Negative for itching and visual disturbance.  Respiratory:  Negative for cough.   Cardiovascular:  Negative for chest pain, palpitations and leg swelling.  Gastrointestinal:  Negative for abdominal distention, blood in stool, diarrhea and nausea.  Genitourinary:  Negative for frequency and hematuria.  Musculoskeletal:  Positive for arthralgias, back pain and gait problem. Negative for joint swelling and neck pain.  Skin:  Negative for rash.  Neurological:  Positive for numbness. Negative for dizziness, tremors, speech difficulty and weakness.  Hematological:  Bruises/bleeds easily.  Psychiatric/Behavioral:  Positive for dysphoric mood. Negative for agitation, sleep disturbance and suicidal ideas. The patient is not nervous/anxious.     Objective:  BP 126/66   Pulse (!) 58   Temp 98 F (36.7 C)   Ht 5' 8 (1.727 m)   Wt 193 lb 6.4 oz (87.7 kg)   SpO2 97%   BMI 29.41 kg/m   BP Readings from Last 3 Encounters:  02/08/24 126/66  11/08/23 128/71  10/11/23 130/70    Wt  Readings from Last 3 Encounters:  02/08/24 193 lb 6.4 oz (87.7 kg)  11/08/23 194 lb (88 kg)  10/11/23 191 lb 9.6 oz (86.9 kg)    Physical Exam Constitutional:      General: He is not in acute distress.    Appearance: He is well-developed.     Comments: NAD  Eyes:     Conjunctiva/sclera: Conjunctivae normal.     Pupils: Pupils are equal, round, and reactive to light.  Neck:     Thyroid : No thyromegaly.     Vascular: No JVD.  Cardiovascular:     Rate and Rhythm: Normal rate and regular rhythm.     Heart sounds: Normal heart sounds. No murmur heard.    No friction rub. No gallop.  Pulmonary:     Effort: Pulmonary effort is normal. No respiratory distress.     Breath sounds: Normal breath sounds. No wheezing or rales.  Chest:     Chest wall: No tenderness.  Abdominal:     General: Bowel sounds are normal. There is no distension.     Palpations: Abdomen is soft. There is no mass.     Tenderness: There is no abdominal tenderness. There is no guarding or rebound.  Musculoskeletal:        General: Tenderness present. Normal range of motion.     Cervical back: Normal range of motion.     Right lower leg: No edema.     Left lower leg: No edema.  Lymphadenopathy:     Cervical: No cervical adenopathy.  Skin:    General: Skin is warm and dry.     Findings: No rash.  Neurological:     Mental Status: He is alert and oriented to person, place, and time.     Cranial Nerves: No cranial nerve deficit.     Motor: No abnormal muscle tone.     Coordination: Coordination normal.     Gait: Gait normal.     Deep Tendon Reflexes: Reflexes are normal and symmetric.  Psychiatric:        Behavior: Behavior normal.        Thought Content: Thought content normal.        Judgment: Judgment normal.    Limping a little R post thigh - tender   Lab Results  Component Value Date   WBC 8.8 07/28/2023   HGB 13.5 07/28/2023   HCT 40.7 07/28/2023   PLT 152.0 07/28/2023   GLUCOSE 81 11/08/2023    CHOL 123 07/28/2023   TRIG 58.0 07/28/2023   HDL 48.80 07/28/2023   LDLCALC 63 07/28/2023   ALT 6 11/08/2023   AST 18 11/08/2023   NA 139 11/08/2023   K 4.1 11/08/2023   CL 103 11/08/2023   CREATININE 1.30  11/08/2023   BUN 20 11/08/2023   CO2 31 11/08/2023   TSH 2.05 07/28/2023   PSA 0.33 01/11/2022   INR 1.1 12/04/2019   HGBA1C 5.7 11/08/2023    VAS US  CAROTID Result Date: 06/02/2022 Carotid Arterial Duplex Study Patient Name:  MCKALE HAFFEY  Date of Exam:   06/02/2022 Medical Rec #: [id number]        Accession #:    [id number] Date of Birth: [birth year]         Patient Gender: M Patient Age:   41 years Exam Location:  [facility] Procedure:      VAS US  CAROTID Referring Phys: PRAMOD SETHI --------------------------------------------------------------------------------  Indications:       History of CVA, follow up. Risk Factors:      Hypertension, hyperlipidemia, past history of smoking, prior                    MI, coronary artery disease, prior CVA. Comparison Study:  12-10-2019 Carotid duplex showed 1-39% bilateral ICA Performing Technologist: Vernell Iba RDMS, RVT  Examination Guidelines: A complete evaluation includes B-mode imaging, spectral Doppler, color Doppler, and power Doppler as needed of all accessible portions of each vessel. Bilateral testing is considered an integral part of a complete examination. Limited examinations for reoccurring indications may be performed as noted.  Right Carotid Findings: +----------+--------+--------+--------+------------------+--------+           PSV cm/sEDV cm/sStenosisPlaque DescriptionComments +----------+--------+--------+--------+------------------+--------+ CCA Prox  85      16              calcific                   +----------+--------+--------+--------+------------------+--------+ CCA Distal63      17              calcific                   +----------+--------+--------+--------+------------------+--------+ ICA  Prox  80      22      1-39%   calcific                   +----------+--------+--------+--------+------------------+--------+ ICA Mid   125     32                                         +----------+--------+--------+--------+------------------+--------+ ICA Distal70      21                                         +----------+--------+--------+--------+------------------+--------+ ECA       125     17              calcific                   +----------+--------+--------+--------+------------------+--------+ +----------+--------+-------+----------------+-------------------+           PSV cm/sEDV cmsDescribe        Arm Pressure (mmHG) +----------+--------+-------+----------------+-------------------+ [id number]            Multiphasic, WNL                    +----------+--------+-------+----------------+-------------------+ +---------+--------+--+--------+--+---------+ VertebralPSV cm/s44EDV cm/s13Antegrade +---------+--------+--+--------+--+---------+  Left Carotid Findings: +----------+--------+--------+--------+------------------+------------------+           PSV cm/sEDV cm/sStenosisPlaque  DescriptionComments           +----------+--------+--------+--------+------------------+------------------+ CCA Prox  103     22                                                   +----------+--------+--------+--------+------------------+------------------+ CCA Distal74      16                                intimal thickening +----------+--------+--------+--------+------------------+------------------+ ICA Prox  85      27      1-39%   calcific                             +----------+--------+--------+--------+------------------+------------------+ ICA Mid   93      29                                                   +----------+--------+--------+--------+------------------+------------------+ ICA Distal88      29                                                    +----------+--------+--------+--------+------------------+------------------+ ECA       119     14                                                   +----------+--------+--------+--------+------------------+------------------+ +----------+--------+--------+----------------+-------------------+           PSV cm/sEDV cm/sDescribe        Arm Pressure (mmHG) +----------+--------+--------+----------------+-------------------+ [id number]             Multiphasic, WNL                    +----------+--------+--------+----------------+-------------------+ +---------+--------+--+--------+--+---------+ VertebralPSV cm/s47EDV cm/s14Antegrade +---------+--------+--+--------+--+---------+   Summary: Right Carotid: Velocities in the right ICA are consistent with a 1-39% stenosis. Left Carotid: Velocities in the left ICA are consistent with a 1-39% stenosis. Vertebrals:  Bilateral vertebral arteries demonstrate antegrade flow. Subclavians: Normal flow hemodynamics were seen in bilateral subclavian              arteries. *See table(s) above for measurements and observations.  Electronically signed by Eather Popp MD on 06/02/2022 at 4:01:09 PM.    Final     Assessment & Plan:   Problem List Items Addressed This Visit     Anxiety disorder   Cont on Wellbutrin  Xanax  prn  Potential benefits of a long term benzodiazepines  use as well as potential risks  and complications were explained to the patient and were aknowledged.      Chronic renal insufficiency, stage 3 (moderate)   Cont with Coreg , Hydralazine  Hydrate well      Hyperglycemia   Check A1c      LOW BACK PAIN SYNDROME   Recurrent - MSK Tramadol  prn  Potential benefits of a  long term opioids use as well as potential risks (i.e. addiction risk, apnea etc) and complications (i.e. Somnolence, constipation and others) were explained to the patient and were aknowledged.      Paresthesias - Primary   Numbness  in the R hand and R foot  after CVA - worse Will try Gabapentin  prn low dose Vit B complex         Meds ordered this encounter  Medications   ipratropium-albuterol  (DUONEB) 0.5-2.5 (3) MG/3ML SOLN    Sig: Take 3 mLs by nebulization every 4 (four) hours as needed (every 4-6 hours prn). Take by nebulization every 4-6 hours as needed    Dispense:  360 mL    Refill:  2   b complex vitamins capsule    Sig: Take 1 capsule by mouth daily.    Dispense:  100 capsule    Refill:  3   gabapentin  (NEURONTIN ) 100 MG capsule    Sig: Take 1 po tid prn tingling    Dispense:  90 capsule    Refill:  5      Follow-up: Return in about 3 months (around 05/10/2024) for a follow-up visit.  Marolyn Noel, MD

## 2024-02-08 NOTE — Assessment & Plan Note (Signed)
 Check A1c.

## 2024-02-08 NOTE — Assessment & Plan Note (Signed)
Cont with Coreg, Hydralazine ?Hydrate well ?

## 2024-02-13 ENCOUNTER — Other Ambulatory Visit: Payer: Self-pay | Admitting: Medical Genetics

## 2024-02-13 DIAGNOSIS — Z006 Encounter for examination for normal comparison and control in clinical research program: Secondary | ICD-10-CM

## 2024-02-14 ENCOUNTER — Ambulatory Visit: Admitting: Podiatry

## 2024-02-14 ENCOUNTER — Encounter: Payer: Self-pay | Admitting: Podiatry

## 2024-02-14 DIAGNOSIS — B351 Tinea unguium: Secondary | ICD-10-CM | POA: Diagnosis not present

## 2024-02-14 DIAGNOSIS — M79675 Pain in left toe(s): Secondary | ICD-10-CM

## 2024-02-14 DIAGNOSIS — M79674 Pain in right toe(s): Secondary | ICD-10-CM

## 2024-02-14 DIAGNOSIS — N2889 Other specified disorders of kidney and ureter: Secondary | ICD-10-CM | POA: Diagnosis not present

## 2024-02-14 NOTE — Progress Notes (Signed)
This patient presents to the office with chief complaint of long thick painful nails.  Patient says the nails are painful walking and wearing shoes.  This patient is unable to self treat.  This patient is unable to trim his nails since he is unable to reach his  nails.  She presents to the office for preventative foot care services.  General Appearance  Alert, conversant and in no acute stress.  Vascular  Dorsalis pedis and posterior tibial  pulses are palpable  bilaterally.  Capillary return is within normal limits  bilaterally. Temperature is within normal limits  bilaterally.  Neurologic  Senn-Weinstein monofilament wire test within normal limits  bilaterally. Muscle power within normal limits bilaterally.  Nails Thick disfigured discolored nails with subungual debris  from hallux to fifth toes bilaterally. No evidence of bacterial infection or drainage bilaterally.  Orthopedic  No limitations of motion  feet .  No crepitus or effusions noted.  No bony pathology or digital deformities noted.  Skin  normotropic skin with no porokeratosis noted bilaterally.  No signs of infections or ulcers noted.     Onychomycosis  Nails  B/L.  Pain in right toes  Pain in left toes  Debridement of nails both feet followed trimming the nails with dremel tool.    RTC 3 months.   Gardiner Barefoot DPM

## 2024-02-18 ENCOUNTER — Encounter: Payer: Self-pay | Admitting: Internal Medicine

## 2024-02-21 ENCOUNTER — Other Ambulatory Visit: Payer: Self-pay | Admitting: Internal Medicine

## 2024-02-21 ENCOUNTER — Other Ambulatory Visit: Payer: Self-pay | Admitting: Cardiovascular Disease

## 2024-02-22 MED ORDER — LIDOCAINE 5 % EX PTCH: 1.0000 | MEDICATED_PATCH | CUTANEOUS | 3 refills | Status: AC

## 2024-02-22 MED ORDER — NITROGLYCERIN 0.4 MG SL SUBL: 0.4000 mg | SUBLINGUAL_TABLET | SUBLINGUAL | 0 refills | Status: AC | PRN

## 2024-02-23 ENCOUNTER — Ambulatory Visit: Admitting: Internal Medicine

## 2024-02-24 ENCOUNTER — Telehealth: Payer: Self-pay

## 2024-02-24 ENCOUNTER — Other Ambulatory Visit (HOSPITAL_COMMUNITY): Payer: Self-pay

## 2024-02-24 NOTE — Telephone Encounter (Signed)
 Pharmacy Patient Advocate Encounter  Received notification from [facility] that Prior Authorization for Lidocaine  5% patches has been DENIED.  See denial reason below. No denial letter attached in CMM. Will attach denial letter to Media tab once received.   PA #/Case ID/Reference #: [id number]

## 2024-02-24 NOTE — Telephone Encounter (Signed)
 Pharmacy Patient Advocate Encounter   Received notification from CoverMyMeds that prior authorization for Lidocaine  5% patches is required/requested.   Insurance verification completed.   The patient is insured through [facility].   Per test claim: PA required; PA submitted to above mentioned insurance via Latent Key/confirmation #/EOC [id number] Status is pending

## 2024-02-28 ENCOUNTER — Other Ambulatory Visit: Payer: Self-pay | Admitting: Internal Medicine

## 2024-02-28 DIAGNOSIS — E785 Hyperlipidemia, unspecified: Secondary | ICD-10-CM

## 2024-02-29 ENCOUNTER — Ambulatory Visit: Admitting: Internal Medicine

## 2024-03-01 ENCOUNTER — Ambulatory Visit: Admitting: Internal Medicine

## 2024-03-04 ENCOUNTER — Other Ambulatory Visit: Payer: Self-pay | Admitting: Internal Medicine

## 2024-03-06 ENCOUNTER — Encounter: Payer: Self-pay | Admitting: Internal Medicine

## 2024-03-06 DIAGNOSIS — Z0000 Encounter for general adult medical examination without abnormal findings: Secondary | ICD-10-CM

## 2024-03-22 ENCOUNTER — Other Ambulatory Visit

## 2024-03-22 DIAGNOSIS — Z0000 Encounter for general adult medical examination without abnormal findings: Secondary | ICD-10-CM | POA: Diagnosis not present

## 2024-03-22 DIAGNOSIS — Z125 Encounter for screening for malignant neoplasm of prostate: Secondary | ICD-10-CM

## 2024-03-22 LAB — LIPID PANEL
Cholesterol: 131 mg/dL (ref 0–200)
HDL: 46.5 mg/dL (ref >39.00)
LDL Cholesterol: 74 mg/dL (ref 0–99)
NonHDL: 84.65
Total CHOL/HDL Ratio: 3
Triglycerides: 54 mg/dL (ref 0.0–149.0)
VLDL: 10.8 mg/dL (ref 0.0–40.0)

## 2024-03-22 LAB — COMPREHENSIVE METABOLIC PANEL WITH GFR
ALT: 13 U/L (ref 0–53)
AST: 15 U/L (ref 0–37)
Albumin: 3.7 g/dL (ref 3.5–5.2)
Alkaline Phosphatase: 70 U/L (ref 39–117)
BUN: 17 mg/dL (ref 6–23)
CO2: 31 meq/L (ref 19–32)
Calcium: 8.7 mg/dL (ref 8.4–10.5)
Chloride: 102 meq/L (ref 96–112)
Creatinine, Ser: 1.27 mg/dL (ref 0.40–1.50)
GFR: 55.92 mL/min — ABNORMAL LOW (ref >60.00)
Glucose, Bld: 86 mg/dL (ref 70–99)
Potassium: 4.5 meq/L (ref 3.5–5.1)
Sodium: 140 meq/L (ref 135–145)
Total Bilirubin: 0.7 mg/dL (ref 0.2–1.2)
Total Protein: 5.8 g/dL — ABNORMAL LOW (ref 6.0–8.3)

## 2024-03-22 LAB — URINALYSIS, ROUTINE W REFLEX MICROSCOPIC
Bilirubin Urine: NEGATIVE
Hgb urine dipstick: NEGATIVE
Ketones, ur: NEGATIVE
Leukocytes,Ua: NEGATIVE
Nitrite: NEGATIVE
RBC / HPF: NONE SEEN (ref 0–?)
Specific Gravity, Urine: >=1.03 — AB (ref 1.000–1.030)
Total Protein, Urine: NEGATIVE
Urine Glucose: NEGATIVE
Urobilinogen, UA: 0.2 (ref 0.0–1.0)
pH: 6 (ref 5.0–8.0)

## 2024-03-22 LAB — CBC WITH DIFFERENTIAL/PLATELET
Basophils Absolute: 0.1 K/uL (ref 0.0–0.1)
Basophils Relative: 0.7 % (ref 0.0–3.0)
Eosinophils Absolute: 0.7 K/uL (ref 0.0–0.7)
Eosinophils Relative: 8.6 % — ABNORMAL HIGH (ref 0.0–5.0)
HCT: 38.9 % — ABNORMAL LOW (ref 39.0–52.0)
Hemoglobin: 13.3 g/dL (ref 13.0–17.0)
Lymphocytes Relative: 13.9 % (ref 12.0–46.0)
Lymphs Abs: 1.1 K/uL (ref 0.7–4.0)
MCHC: 34.2 g/dL (ref 30.0–36.0)
MCV: 93.9 fl (ref 78.0–100.0)
Monocytes Absolute: 1 K/uL (ref 0.1–1.0)
Monocytes Relative: 12.7 % — ABNORMAL HIGH (ref 3.0–12.0)
Neutro Abs: 4.9 K/uL (ref 1.4–7.7)
Neutrophils Relative %: 64.1 % (ref 43.0–77.0)
Platelets: 146 K/uL — ABNORMAL LOW (ref 150.0–400.0)
RBC: 4.14 Mil/uL — ABNORMAL LOW (ref 4.22–5.81)
RDW: 13.2 % (ref 11.5–15.5)
WBC: 7.7 K/uL (ref 4.0–10.5)

## 2024-03-22 LAB — PSA: PSA: 0.34 ng/mL (ref 0.10–4.00)

## 2024-03-22 LAB — HEMOGLOBIN A1C: Hgb A1c MFr Bld: 5.5 % (ref 4.6–6.5)

## 2024-03-22 LAB — CK: Total CK: 47 U/L (ref 17–232)

## 2024-03-22 LAB — TSH: TSH: 2.46 u[IU]/mL (ref 0.35–5.50)

## 2024-03-26 ENCOUNTER — Ambulatory Visit: Payer: Self-pay | Admitting: Internal Medicine

## 2024-04-20 ENCOUNTER — Other Ambulatory Visit: Payer: Self-pay | Admitting: Internal Medicine

## 2024-04-23 ENCOUNTER — Encounter: Payer: Self-pay | Admitting: Neurology

## 2024-04-23 ENCOUNTER — Ambulatory Visit: Admitting: Neurology

## 2024-04-23 VITALS — BP 136/72 | HR 58 | Ht 69.0 in | Wt 194.0 lb

## 2024-04-23 DIAGNOSIS — G3184 Mild cognitive impairment, so stated: Secondary | ICD-10-CM

## 2024-04-23 DIAGNOSIS — G20C Parkinsonism, unspecified: Secondary | ICD-10-CM

## 2024-04-23 DIAGNOSIS — R202 Paresthesia of skin: Secondary | ICD-10-CM

## 2024-04-23 DIAGNOSIS — Z8673 Personal history of transient ischemic attack (TIA), and cerebral infarction without residual deficits: Secondary | ICD-10-CM | POA: Diagnosis not present

## 2024-04-23 NOTE — Patient Instructions (Signed)
 I had a long discussion with the patient regarding his remote thalamic stroke poststroke paresthesias, mild parkinsonism cognitive impairment and answered questions.  He continues to have intermittent paresthesias but he has not tolerated trials of different medications and is willing to try the medication at this point.  .  I recommend he increase participation in cognitively challenging activities like solving crossword puzzles, playing bridge and sudoku.  We also discussed memory compensation strategies.  Continue Plavix  for stroke prevention with aggressive risk factor modification with strict control of hypertension with blood pressure goal below 130/90, lipids with LDL cholesterol goal below 70 mg percent and diabetes with hemoglobin A1c goal below 6.5%.  Return for follow-up in the future in 6 months with nurse practitioner or call earlier if necessary

## 2024-04-23 NOTE — Progress Notes (Signed)
 " [facility] [address] [place]. [place] [address] [phone]       OFFICE FOLLOW UP NOTE  Larry Stone Date of Birth:  June 03, 1950 Medical Record Number:  [id number] Take care: Referring MD: Marolyn Noel  Reason for Referral: Stroke   Chief Complaint  Patient presents with   RM20/STROKE    Pt is here Alone. Pt states he has been doing good since his stroke.       HPI:  Update 04/23/2024 : He returns for follow-up after last visit with Harlene nurse practitioner 6 months ago.  He continues to have intermittent right face, foot and hand paresthesias but he has failed trials of multiple medications in the past and does not want to try any medications.  He also has intermittent tremors are not bothersome.  He denies significant drooling of saliva or bradykinesia.  He states his balance is not good.  He can catch himself and has not had any falls.  He remains on Plavix  which is tolerating well without bruising or bleeding.  He is tolerating Pravachol  well without muscle aches and pains.  Lab work on 12//25 showed LDL cholesterol to be 74 mg percent and hemoglobin A1c 5.5.  His blood pressure is good.  No new complaints.  He has not had recurrent stroke or TIA symptoms since 2021 Update 10/10/2023 JM: Patient returns for follow-up visit unaccompanied.  Overall stable since prior visit.  Continues to have fluctuation of right sided numbness, primarily affecting right hand and foot but at times can be present up into his arm and up his leg, does not last long. Can have difficulty gripping/holding objects or standing when present. No clear trigger for worsening symptoms. Denies neck or low back pain. Feels cognition has been overall stable, denies progression. Continues to maintain ADLs and IADLs independently.  Continues to drive without difficulty. No new stroke/TIA symptoms. Remains on Sinemet  half tab TID, tolerating without side effects. Notes improvement of tremor  and gait. No recent falls.  Blood pressure mildly elevated today, asymptomatic, typically stable.  Routinely follows with PCP for stroke risk factor management and routine follow-up with cardiology for CHF.           History provided for reference purposes only Update 03/30/2023 JM: Patient returns for 41-month follow-up unaccompanied.  At prior visit with Dr. 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   He does have chronic right hand and foot paresthesias poststroke but has noticed slight worsening over the past 3-4 months. Previously numbness only affecting first 3 digits but now affecting whole hand, noticed some worsening numbness in foot as well. Denies any weakness or pain. More just bothersome. Can have some numbness in right arm upon awakening but will gradually resolve after being awake for a little while and then will only.be in his hand.  Denies neck or lower back pain.  Does have history of B12 deficiency, previously on supplement but reports B12 level elevated therefore was discontinued (prior B12 level check in 2017 which was satisfactory).  Denies any new stroke/TIA symptoms.  Compliant on Plavix , Zetia  and pravastatin .  Routinely follows with PCP for stroke risk factor management.    He is no longer taking Sinemet  due to nausea, reports tolerating well while gradually tolerating up to 1 tab BID but then increased up to 4x per day and started to have significant nausea therefore he discontinued.  He did see some  improvement of tremors and gait/balance while taking. Reports stopping Abilify  and no longer experiencing drooling.   Cognition has remained stable, more issues with short term memory, reports donepezil  10mg  daily caused diarrhea therefore discontinued. Continues to maintain ADLs and IADLs independently as well as driving without difficulty.   Update 12/22/2022 : Patient is seen for follow-up today  upon his request after last visit 7 months ago.  He is has new complaint of increased drooling on the right side of his mouth.  This is intermittent and can occur during the day or night.  There are no obvious triggers.  He has to use a lot of napkins to wipe his face.  He denies any trouble swallowing or any pain in his teeth or swelling inside his mouth.  He does admit to his voice getting softer difficulty here.  He is also noticed increased difficulty in getting out of a chair and in wearing his clothes.  He has noticed a tremor in his hands particularly in the left 1 last several months.  He denies any stooped posture or walking with feet stuck to the ground.  He states his posture balance is poor and at times tends to fall backwards.  He has started walking with some shuffling.  He denies any recurrent stroke or TIA symptoms.  He remains on Plavix  which is tolerating well without bruising or bleeding.  States his blood pressure is under good control.  His sugars have also been quite fine.  He did undergo follow-up carotid ultrasound on 06/02/2022 which showed no significant extracranial stenosis.  He continues to have short-term memory difficulties which are unchanged the long-term memory is good.  He is still on Aricept  5 mg daily which is tolerating well without side effects but he has never increase the dose to 10 mg.  He is still independent actives of daily living.  He has been driving and states he never has gotten lost.  Continues to have mild paresthesias in the right face hands and feet which are intermittent and states she has gotten used to it and is tolerating it well. Update 05/25/2022 : He returns for follow-up after last visit in May 2023.  He states that he has noticed worsening of paresthesias on his face as well as right hand and leg.  These are intermittent and they come and go and can last variably from few minutes to up to an hour.  He has gotten used to them.  He is also noted some decrease  in his short-term memory phonation is difficult for him to remember.  His long-term memory is not as bad.  He has never gotten while driving.  He is still independent in all activities of daily living.  His primary care physician gave him a prescription for Aricept  5 mg daily for a month but he has not yet started it.  He has an appointment to see his primary care physician for follow-up lab work dated done today for his lipid profile A1c.  He has been compliant with taking his Plavix  which is tolerating well without bruising or bleeding.  His blood pressure is under good control and today it is 135/75.  He is tolerating Pravachol  and Zetia  well without side effects.  He states his sugars are doing better now.  Update 09/16/2021 JM: Patient returns for 60-month stroke follow-up.  He has been stable from stroke standpoint without new stroke/TIA symptoms.  Reports continued right hand and face dysesthesias. He has greater difficulty with  his hand which he feels can fluctuate between feeling cold and hot.  Use of compression glove provides some relief. Continues to have difficulty with right hamstring tear - currently using tramadol  which helps pain some but is still limited in activity.  This is managed by PCP.  Compliant on Plavix , pravastatin  and Zetia , denies side effects.  Blood pressure today 147/76. Monitors at home and typically 130s/70s.  Routinely follows with PCP and cardiology.  No further concerns at this time    History provided for reference purposes only Update 03/03/2021 JM: Returns for 56-month stroke follow-up unaccompanied  Overall stable -denies new stroke/TIA symptoms Continued right hand and face post stroke pain - previously rx'd amitriptyline  (for both pain and anxiety) but did not take due to interaction with bupropion .  Prior intolerance to topiramate  and pregabalin .  He does continue to experience anxiety -currently on bupropion  and Xanax  managed by PCP Has been dealing with right  hamstring tear - has been placed on tramadol  due to severe pain - questions if tramadol  has been helping post stroke pain some or if he hasn't been as focused on post stroke pain due to severe pain of right thigh - being followed by ortho Dr. Jerri  Compliant on Plavix , pravastatin  and Zetia  -denies side effects Blood pressure today 115/64 Recent A1c 5.8 on metformin  500 mg daily  No new concerns at this time  Update 10/27/2020 JM: Larry Stone returns for 4 month stroke follow-up unaccompanied.  Stable from stroke standpoint without new stroke/TIA symptoms.  He continues to have difficulty with right face, hand and occasional foot paresthesias describing as burning and electrical shock sensation.  He has previously trialed pregabalin  and topiramate  but unable to tolerate.  Previously complained worsening gait difficulty, slurred speech and cognitive impairment after initiating pregabalin  which resolved shortly after discontinuing.  He is currently participating in PT for right posterior thigh pain followed by orthopedics Dr. Hughie.  He also reports memory changes since his stroke including increased irritability and low tolerance levels - hx of depression and anxiety currently on bupropion  and Xanax  as needed per PCP. Reports compliance on Plavix , pravastatin  and Zetia  without associated side effects.  Blood pressure today 134/72.  No further concerns at this time.   Update 06/19/2020 JM: Larry Stone returns for stroke follow-up after prior visit approximately 5 months ago with Dr. Rosemarie.  Topiramate  initiated at prior visit for poststroke paresthesias but unable to tolerate. Placed on Lyrica  50 mg twice daily beginning of November and reports approximately 2 weeks after, he was noticing increased lethargy and fluctuation of gait impairment with imbalance (getting pulled to the right) and lightheadedness, slurred speech and cognitive difficulties such as delayed responses and short-term memory difficulties.   Gait impairment with imbalance and slurred speech all present initially with stroke.  Denies new stroke/TIA symptoms.   He is unsure if Lyrica  was beneficial for paresthesias as he does report some improvement since prior visit but right hand and right facial paresthesias still present. He also admits to struggling with depression with history of depression PTA on bupropion  150 mg twice daily but noticed worsening after his stroke.  He is also on Xanax  as needed per PCP He has been trying to keep active and does report doing HEP daily as advised during therapy sessions  Reports compliance on Plavix  and pravastatin  and Zetia -denies side effects Blood pressure today 135/66  No further concerns at this time  Initial visit 02/06/2020 Dr. Rosemarie: Larry Stone is a pleasant 74 year old  Caucasian male with past medical history of hypertension, hyper lipidemia, diabetes, congestive heart failure and coronary artery disease who developed sudden onset of right lower face and hand numbness as well as imbalance and difficulty walking on 12/03/2019.  He did not seek medical help the same day as if waited for it to get better.  Next day he went to the hospital but got tired waiting in the ER since he was not seen and instead went to see his primary physician Dr. Garald who ordered an outpatient stroke work-up.  MRI scan of the brain done on 12/20/2019 shows a subacute left thalamic lacunar infarct.  Carotid ultrasound on 12/10/2019 showed no significant extracranial stenosis.  Echocardiogram on 12/28/2018 showed slightly diminished ejection fraction of 45 to 50% but no clot or definite cardiac source of embolism was noted.  He underwent 30 days of outpatient telemetry cardiac monitoring which was negative for atrial fibrillation or significant arrhythmias.  Carotid ultrasound on 12/10/2019 was unremarkable.  LDL cholesterol was 78 mg percent on 12/26/2019.  Patient is presently on Plavix  75 mg daily which is tolerating well  without bruising or bleeding.  He states his gait and balance have improved and he has finished outpatient physical and occupational therapy.  He still stumbles occasionally but as long to walk slowly and carefully.  He has had no falls or injuries.  He however still has persistent paresthesias in his right face and right hand which are bothersome and he would like to try some medications for this.  He has had no recurrent stroke or TIA symptoms.  States his blood pressure is well controlled and today it is 113/65 in office.  States his sugars are also doing well however he cannot tell me when his last hemoglobin A1c was checked.  He has been scheduled to undergo loop recorder pending my consultation visit today.  ROS:   14 system review of systems is positive for those listed in HPI and all other systems negative  PMH:  Past Medical History:  Diagnosis Date   Allergy     Anxiety    CAD    a. acute inferior lateral wall infarction in September 2004 treated medically. b.  cath 06/17/16 showing mild nonobstructive CAD with 30-40% ostial LM (eccentric), elevated LV filling pressures and normal LV function.   Chronic diastolic CHF (congestive heart failure) (HCC)    CKD (chronic kidney disease), stage II    COPD (chronic obstructive pulmonary disease) (HCC)    Diverticulosis    DJD (degenerative joint disease)    GERD (gastroesophageal reflux disease)    HTN (hypertension)    Hyperlipidemia    Low back pain syndrome    Myocardial infarction (HCC) 2004   Obesity    OSA (obstructive sleep apnea)    RBBB    Recurrent aspiration bronchitis/pneumonia    Recurrent aspiration pneumonia (HCC)    thelbert 07/13/2016   Stroke (HCC)    Thrombocytopenia    Tubular adenoma of colon 2014    Social History:  Social History   Socioeconomic History   Marital status: Married    Spouse name: Mary   Number of children: 1   Years of education: Not on file   Highest education level: Some college, no degree   Occupational History   Occupation: Copywriter, [employer]  Tobacco Use   Smoking status: Former    Current packs/day: 0.00    Average packs/day: 1 pack/day for 20.0 years (20.0 ttl pk-yrs)    Types:  Cigarettes, Cigars    Start date: 05/08/1978    Quit date: 05/08/1998    Years since quitting: 25.9   Smokeless tobacco: Never   Tobacco comments:    quit in 2005  Vaping Use   Vaping status: Never Used  Substance and Sexual Activity   Alcohol use: Yes    Alcohol/week: 1.0 standard drink of alcohol    Types: 1 Standard drinks or equivalent per week    Comment: rarely   Drug use: No   Sexual activity: Yes  Other Topics Concern   Not on file  Social History Narrative   Lives with spouse only   Right Handed   Drinks <1 cup caffeine daily   Social Drivers of Health   Tobacco Use: Medium Risk (04/23/2024)   Patient History    Smoking Tobacco Use: Former    Smokeless Tobacco Use: Never    Passive Exposure: Not on Actuary Strain: Low Risk (10/07/2023)   Overall Financial Resource Strain (CARDIA)    Difficulty of Paying Living Expenses: Not hard at all  Food Insecurity: No Food Insecurity (10/07/2023)   Epic    Worried About Radiation Protection Practitioner of Food in the Last Year: Never true    Ran Out of Food in the Last Year: Never true  Transportation Needs: No Transportation Needs (10/07/2023)   Epic    Lack of Transportation (Medical): No    Lack of Transportation (Non-Medical): No  Physical Activity: Inactive (10/07/2023)   Exercise Vital Sign    Days of Exercise per Week: 0 days    Minutes of Exercise per Session: Not on file  Stress: Stress Concern Present (10/07/2023)   [employer] of Occupational Health - Occupational Stress Questionnaire    Feeling of Stress: To some extent  Social Connections: Unknown (10/07/2023)   Social Connection and Isolation Panel    Frequency of Communication with Friends and Family: Twice a week    Frequency of Social  Gatherings with Friends and Family: Patient declined    Attends Religious Services: Patient declined    Database Administrator or Organizations: No    Attends Engineer, Structural: Not on file    Marital Status: Married  Catering Manager Violence: Not At Risk (05/16/2023)   Humiliation, Afraid, Rape, and Kick questionnaire    Fear of Current or Ex-Partner: No    Emotionally Abused: No    Physically Abused: No    Sexually Abused: No  Depression (PHQ2-9): Low Risk (05/16/2023)   Depression (PHQ2-9)    PHQ-2 Score: 0  Alcohol Screen: Low Risk (10/07/2023)   Alcohol Screen    Last Alcohol Screening Score (AUDIT): 1  Housing: Low Risk (10/07/2023)   Epic    Unable to Pay for Housing in the Last Year: No    Number of Times Moved in the Last Year: 0    Homeless in the Last Year: No  Utilities: Not At Risk (05/16/2023)   AHC Utilities    Threatened with loss of utilities: No  Health Literacy: Adequate Health Literacy (05/16/2023)   B1300 Health Literacy    Frequency of need for help with medical instructions: Never    Medications:   Current Outpatient Medications on File Prior to Visit  Medication Sig Dispense Refill   ALPRAZolam  (XANAX ) 0.5 MG tablet TAKE 1 TABLET BY MOUTH THREE TIMES A DAY AS NEEDED 270 tablet 0   ammonium lactate  (LAC-HYDRIN ) 12 % lotion Apply topically as directed. 400 mL 3  buPROPion  (WELLBUTRIN  SR) 150 MG 12 hr tablet TAKE 1 TABLET BY MOUTH TWICE A DAY 180 tablet 3   carvedilol  (COREG ) 25 MG tablet TAKE 1 TABLET BY MOUTH TWICE A DAY WITH FOOD 180 tablet 3   clopidogrel  (PLAVIX ) 75 MG tablet TAKE 1 TABLET BY MOUTH EVERY DAY 90 tablet 2   clotrimazole -betamethasone  (LOTRISONE ) cream APPLY AS DIRECTED 45 g 11   ezetimibe  (ZETIA ) 10 MG tablet Take 1 tablet (10 mg total) by mouth daily. 90 tablet 3   furosemide  (LASIX ) 20 MG tablet TAKE 1 TABLET (20 MG TOTAL) BY MOUTH DAILY AS NEEDED FOR FLUID OR EDEMA. 90 tablet 1   hydrALAZINE  (APRESOLINE ) 50 MG tablet TAKE 1  TABLET BY MOUTH FOUR TIMES A DAY 360 tablet 3   ipratropium-albuterol  (DUONEB) 0.5-2.5 (3) MG/3ML SOLN Take 3 mLs by nebulization every 4 (four) hours as needed (every 4-6 hours prn). Take by nebulization every 4-6 hours as needed 360 mL 2   meclizine  (ANTIVERT ) 25 MG tablet Take 0.5 tablets (12.5 mg total) by mouth 3 (three) times daily as needed for dizziness. 30 tablet 0   metFORMIN  (GLUCOPHAGE ) 500 MG tablet TAKE 1 TABLET BY MOUTH EVERY DAY WITH BREAKFAST 90 tablet 1   montelukast  (SINGULAIR ) 10 MG tablet TAKE 1 TABLET BY MOUTH EVERYDAY AT BEDTIME 90 tablet 3   nitroGLYCERIN  (NITROSTAT ) 0.4 MG SL tablet Place 1 tablet (0.4 mg total) under the tongue every 5 (five) minutes as needed for chest pain. UP TO 3 DOSES. 75 tablet 0   pantoprazole  (PROTONIX ) 40 MG tablet TAKE 1 TABLET BY MOUTH EVERY DAY 90 tablet 1   pravastatin  (PRAVACHOL ) 40 MG tablet TAKE 1 TABLET BY MOUTH EVERY DAY 90 tablet 3   tamsulosin  (FLOMAX ) 0.4 MG CAPS capsule TAKE 1 CAPSULE BY MOUTH EVERY DAY 90 capsule 3   traMADol  (ULTRAM ) 50 MG tablet TAKE 1 TABLET BY MOUTH EVERY 6 HOURS AS NEEDED 120 tablet 1   TRELEGY ELLIPTA  100-62.5-25 MCG/ACT AEPB INHALE 1 PUFF INTO THE LUNGS DAILY 60 each 11   b complex vitamins capsule Take 1 capsule by mouth daily. 100 capsule 3   carbidopa -levodopa  (SINEMET  IR) 25-100 MG tablet Take 0.5 tablets by mouth 3 (three) times daily. 135 tablet 3   gabapentin  (NEURONTIN ) 100 MG capsule Take 1 po tid prn tingling 90 capsule 5   lidocaine  (LIDODERM ) 5 % Place 1-2 patches onto the skin daily. Remove & Discard patch within 12 hours or as directed by MD 180 patch 3   No current facility-administered medications on file prior to visit.    Allergies:   Allergies  Allergen Reactions   Ace Inhibitors Swelling   Angiotensin Receptor Blockers Swelling   Aspirin Hives   Amlodipine      Leg swelling   Crestor [Rosuvastatin Calcium ]     myalgias   Cymbalta  [Duloxetine  Hcl]     Dizzy    Lipitor  [Atorvastatin  Calcium ]     arthralgias   Codeine Nausea And Vomiting   Irbesartan Other (See Comments)    REACTION: allergic to ARBs w/ angioedema   Ramipril Other (See Comments)    REACTION: Allergic to ACE's w/ angioedema...    Physical Exam Today's Vitals   04/23/24 1536  BP: 136/72  Pulse: (!) 58  SpO2: 99%  Weight: 194 lb (88 kg)  Height: 5' 9 (1.753 m)   Body mass index is 28.65 kg/m.    General: well developed, well nourished pleasant elderly Caucasian male, seated, in no evident distress  Head: head normocephalic and atraumatic.   Neck: supple with no carotid or supraclavicular bruits Cardiovascular: regular rate and rhythm, no murmurs Musculoskeletal: no deformity Skin:  no rash/petichiae Vascular:  Normal pulses all extremities  Neurologic Exam Mental Status: Awake and fully alert.  Fluent speech and language. Oriented to place and time. Recent and remote memory intact. Attention span, concentration and fund of knowledge appropriate during visit. Mood and affect appropriate.  Mini-Mental status exam not done today   Able to name 15 animals which can walk on 4 legs.  Clock drawing 4/4. Cranial Nerves: Pupils equal, briskly reactive to light. Extraocular movements full without nystagmus. Visual fields full to confrontation. Hearing diminished despite hearing aid. Facial sensation intact.  Mild right nasolabial fold flattening.  Tongue, palate moves normally and symmetrically.  Motor: Normal bulk and tone. Normal strength in all tested extremity muscles.  Mild pill-rolling resting tremor of the left hand.  Mild tremor left greater than right of outstretched hands bilaterally.  No cogwheel rigidity. Sensory.: intact to touch , pinprick , position and vibratory sensation -subjective paresthesias in the right face and hand. Coordination: Rapid alternating movements normal in all extremities. Finger-to-nose and heel-to-shin performed accurately bilaterally. Gait and Station:  Arises from chair without difficulty. Stance is normal. Gait demonstrates normal stride length and balance without use of assistive device.  Diminished left arm swing while walking.  Mild to moderate difficulty with heel, toe and tandem walk.  Slight retropulsion but does not fall.  No festination or shuffling feet Reflexes: 1+ and symmetric. Toes downgoing.       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   Vascular risk factors of hypertension hyperlipidemia coronary artery disease.  He also has mild memory loss likely due to mild cognitive impairment which appears stable.  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  He continues to have intermittent paresthesias but he has not tolerated trials of different medications and is willing to try the medication at this point.  .  I recommend he increase participation in cognitively challenging activities like solving crossword puzzles, playing bridge and sudoku.  We also discussed memory compensation strategies.  Continue Plavix  for stroke prevention with aggressive risk factor modification with strict control of hypertension with blood pressure goal below 130/90,  lipids with LDL cholesterol goal below 70 mg percent and diabetes with hemoglobin A1c goal below 6.5%.  Return for follow-up in the future in 6 months with nurse practitioner or call earlier if necessary.  I personally spent a total of  35 minutes in the care of the patient today including getting/reviewing separately obtained history, performing a medically appropriate exam/evaluation, counseling and educating,  placing orders, referring and communicating with other health care professionals, documenting clinical information in the EHR, independently interpreting results, and coordinating care.        Eather Popp, MD  [facility] [address] [place], [place] [address]  Phone [phone] Fax [phone] Note: This document was prepared with digital dictation and possible smart phrase technology. Any transcriptional errors that result from this process are unintentional.   "

## 2024-04-27 ENCOUNTER — Other Ambulatory Visit: Payer: Self-pay | Admitting: Internal Medicine

## 2024-05-09 ENCOUNTER — Encounter: Payer: Self-pay | Admitting: Internal Medicine

## 2024-05-09 ENCOUNTER — Ambulatory Visit: Admitting: Internal Medicine

## 2024-05-09 VITALS — BP 128/70 | HR 69 | Temp 97.8°F | Ht 69.0 in | Wt 197.0 lb

## 2024-05-09 DIAGNOSIS — F09 Unspecified mental disorder due to known physiological condition: Secondary | ICD-10-CM | POA: Diagnosis not present

## 2024-05-09 DIAGNOSIS — F419 Anxiety disorder, unspecified: Secondary | ICD-10-CM

## 2024-05-09 DIAGNOSIS — G8929 Other chronic pain: Secondary | ICD-10-CM | POA: Diagnosis not present

## 2024-05-09 DIAGNOSIS — M25551 Pain in right hip: Secondary | ICD-10-CM

## 2024-05-09 DIAGNOSIS — F418 Other specified anxiety disorders: Secondary | ICD-10-CM

## 2024-05-09 DIAGNOSIS — E785 Hyperlipidemia, unspecified: Secondary | ICD-10-CM | POA: Diagnosis not present

## 2024-05-09 MED ORDER — OSELTAMIVIR PHOSPHATE 75 MG PO CAPS: 75.0000 mg | ORAL_CAPSULE | Freq: Two times a day (BID) | ORAL | 0 refills | Status: AC

## 2024-05-09 MED ORDER — BUPROPION HCL ER (SR) 150 MG PO TB12: 150.0000 mg | ORAL_TABLET | Freq: Two times a day (BID) | ORAL | 3 refills | Status: AC

## 2024-05-09 NOTE — Assessment & Plan Note (Signed)
 ROHO cushion Rx

## 2024-05-09 NOTE — Assessment & Plan Note (Signed)
Cont on Wellbutrin Xanax prn  Potential benefits of a long term benzodiazepines  use as well as potential risks  and complications were explained to the patient and were aknowledged.

## 2024-05-09 NOTE — Assessment & Plan Note (Signed)
Better on Tamsulosin  

## 2024-05-09 NOTE — Progress Notes (Signed)
 "  Subjective:  Patient ID: Larry Stone, male    DOB: 18-Sep-1950  Age: 74 y.o. MRN: [id number]  CC: Follow-up   HPI Larry Stone presents for TIA, HTN, depression, hip pain  Outpatient Medications Prior to Visit  Medication Sig Dispense Refill   ALPRAZolam  (XANAX ) 0.5 MG tablet TAKE 1 TABLET BY MOUTH THREE TIMES A DAY AS NEEDED 270 tablet 0   ammonium lactate  (LAC-HYDRIN ) 12 % lotion Apply topically as directed. 400 mL 3   carvedilol  (COREG ) 25 MG tablet TAKE 1 TABLET BY MOUTH TWICE A DAY WITH FOOD 180 tablet 3   clopidogrel  (PLAVIX ) 75 MG tablet TAKE 1 TABLET BY MOUTH EVERY DAY 90 tablet 2   clotrimazole -betamethasone  (LOTRISONE ) cream APPLY AS DIRECTED 45 g 11   ezetimibe  (ZETIA ) 10 MG tablet Take 1 tablet (10 mg total) by mouth daily. 90 tablet 3   furosemide  (LASIX ) 20 MG tablet TAKE 1 TABLET (20 MG TOTAL) BY MOUTH DAILY AS NEEDED FOR FLUID OR EDEMA. 90 tablet 1   hydrALAZINE  (APRESOLINE ) 50 MG tablet TAKE 1 TABLET BY MOUTH FOUR TIMES A DAY 360 tablet 3   ipratropium-albuterol  (DUONEB) 0.5-2.5 (3) MG/3ML SOLN Take 3 mLs by nebulization every 4 (four) hours as needed (every 4-6 hours prn). Take by nebulization every 4-6 hours as needed 360 mL 2   meclizine  (ANTIVERT ) 25 MG tablet Take 0.5 tablets (12.5 mg total) by mouth 3 (three) times daily as needed for dizziness. 30 tablet 0   metFORMIN  (GLUCOPHAGE ) 500 MG tablet TAKE 1 TABLET BY MOUTH EVERY DAY WITH BREAKFAST 90 tablet 1   montelukast  (SINGULAIR ) 10 MG tablet TAKE 1 TABLET BY MOUTH EVERYDAY AT BEDTIME 90 tablet 3   nitroGLYCERIN  (NITROSTAT ) 0.4 MG SL tablet Place 1 tablet (0.4 mg total) under the tongue every 5 (five) minutes as needed for chest pain. UP TO 3 DOSES. 75 tablet 0   pantoprazole  (PROTONIX ) 40 MG tablet TAKE 1 TABLET BY MOUTH EVERY DAY 90 tablet 1   pravastatin  (PRAVACHOL ) 40 MG tablet TAKE 1 TABLET BY MOUTH EVERY DAY 90 tablet 3   tamsulosin  (FLOMAX ) 0.4 MG CAPS capsule TAKE 1 CAPSULE BY MOUTH EVERY DAY 90 capsule  3   traMADol  (ULTRAM ) 50 MG tablet TAKE 1 TABLET BY MOUTH EVERY 6 HOURS AS NEEDED 120 tablet 1   buPROPion  (WELLBUTRIN  SR) 150 MG 12 hr tablet TAKE 1 TABLET BY MOUTH TWICE A DAY 180 tablet 3   b complex vitamins capsule Take 1 capsule by mouth daily. 100 capsule 3   carbidopa -levodopa  (SINEMET  IR) 25-100 MG tablet Take 0.5 tablets by mouth 3 (three) times daily. 135 tablet 3   gabapentin  (NEURONTIN ) 100 MG capsule Take 1 po tid prn tingling 90 capsule 5   lidocaine  (LIDODERM ) 5 % Place 1-2 patches onto the skin daily. Remove & Discard patch within 12 hours or as directed by MD 180 patch 3   TRELEGY ELLIPTA  100-62.5-25 MCG/ACT AEPB INHALE 1 PUFF INTO THE LUNGS DAILY 60 each 11   No facility-administered medications prior to visit.    ROS: Review of Systems  Constitutional:  Negative for appetite change, fatigue and unexpected weight change.  HENT:  Negative for congestion, nosebleeds, sneezing, sore throat and trouble swallowing.   Eyes:  Negative for itching and visual disturbance.  Respiratory:  Negative for cough.   Cardiovascular:  Negative for chest pain, palpitations and leg swelling.  Gastrointestinal:  Negative for abdominal distention, blood in stool, diarrhea and nausea.  Genitourinary:  Negative  for frequency and hematuria.  Musculoskeletal:  Positive for arthralgias. Negative for back pain, gait problem, joint swelling and neck pain.  Skin:  Negative for rash.  Neurological:  Negative for dizziness, tremors, speech difficulty and weakness.  Psychiatric/Behavioral:  Negative for agitation, dysphoric mood, sleep disturbance and suicidal ideas. The patient is nervous/anxious.     Objective:  BP 128/70   Pulse 69   Temp 97.8 F (36.6 C) (Oral)   Ht 5' 9 (1.753 m)   Wt 197 lb (89.4 kg)   SpO2 97%   BMI 29.09 kg/m   BP Readings from Last 3 Encounters:  05/09/24 128/70  04/23/24 136/72  02/08/24 126/66    Wt Readings from Last 3 Encounters:  05/09/24 197 lb (89.4 kg)   04/23/24 194 lb (88 kg)  02/08/24 193 lb 6.4 oz (87.7 kg)    Physical Exam Constitutional:      General: He is not in acute distress.    Appearance: He is well-developed. He is obese.     Comments: NAD  Eyes:     Conjunctiva/sclera: Conjunctivae normal.     Pupils: Pupils are equal, round, and reactive to light.  Neck:     Thyroid : No thyromegaly.     Vascular: No JVD.  Cardiovascular:     Rate and Rhythm: Normal rate and regular rhythm.     Heart sounds: Normal heart sounds. No murmur heard.    No friction rub. No gallop.  Pulmonary:     Effort: Pulmonary effort is normal. No respiratory distress.     Breath sounds: Normal breath sounds. No wheezing or rales.  Chest:     Chest wall: No tenderness.  Abdominal:     General: Bowel sounds are normal. There is no distension.     Palpations: Abdomen is soft. There is no mass.     Tenderness: There is no abdominal tenderness. There is no guarding or rebound.  Musculoskeletal:        General: No tenderness. Normal range of motion.     Cervical back: Normal range of motion.  Lymphadenopathy:     Cervical: No cervical adenopathy.  Skin:    General: Skin is warm and dry.     Findings: No rash.  Neurological:     Mental Status: He is alert and oriented to person, place, and time.     Cranial Nerves: No cranial nerve deficit.     Motor: No abnormal muscle tone.     Coordination: Coordination normal.     Gait: Gait normal.     Deep Tendon Reflexes: Reflexes are normal and symmetric.  Psychiatric:        Behavior: Behavior normal.        Thought Content: Thought content normal.        Judgment: Judgment normal.     Lab Results  Component Value Date   WBC 7.7 03/22/2024   HGB 13.3 03/22/2024   HCT 38.9 (L) 03/22/2024   PLT 146.0 (L) 03/22/2024   GLUCOSE 86 03/22/2024   CHOL 131 03/22/2024   TRIG 54.0 03/22/2024   HDL 46.50 03/22/2024   LDLCALC 74 03/22/2024   ALT 13 03/22/2024   AST 15 03/22/2024   NA 140 03/22/2024    K 4.5 03/22/2024   CL 102 03/22/2024   CREATININE 1.27 03/22/2024   BUN 17 03/22/2024   CO2 31 03/22/2024   TSH 2.46 03/22/2024   PSA 0.34 03/22/2024   INR 1.1 12/04/2019   HGBA1C 5.5 03/22/2024  VAS US  CAROTID Result Date: 06/02/2022 Carotid Arterial Duplex Study Patient Name:  Larry Stone  Date of Exam:   06/02/2022 Medical Rec #: [id number]        Accession #:    [id number] Date of Birth: Mar 05, 1951         Patient Gender: M Patient Age:   40 years Exam Location:  [facility] Procedure:      VAS US  CAROTID Referring Phys: PRAMOD SETHI --------------------------------------------------------------------------------  Indications:       History of CVA, follow up. Risk Factors:      Hypertension, hyperlipidemia, past history of smoking, prior                    MI, coronary artery disease, prior CVA. Comparison Study:  12-10-2019 Carotid duplex showed 1-39% bilateral ICA Performing Technologist: Vernell Iba RDMS, RVT  Examination Guidelines: A complete evaluation includes B-mode imaging, spectral Doppler, color Doppler, and power Doppler as needed of all accessible portions of each vessel. Bilateral testing is considered an integral part of a complete examination. Limited examinations for reoccurring indications may be performed as noted.  Right Carotid Findings: +----------+--------+--------+--------+------------------+--------+           PSV cm/sEDV cm/sStenosisPlaque DescriptionComments +----------+--------+--------+--------+------------------+--------+ CCA Prox  85      16              calcific                   +----------+--------+--------+--------+------------------+--------+ CCA Distal63      17              calcific                   +----------+--------+--------+--------+------------------+--------+ ICA Prox  80      22      1-39%   calcific                   +----------+--------+--------+--------+------------------+--------+ ICA Mid   125     32                                          +----------+--------+--------+--------+------------------+--------+ ICA Distal70      21                                         +----------+--------+--------+--------+------------------+--------+ ECA       125     17              calcific                   +----------+--------+--------+--------+------------------+--------+ +----------+--------+-------+----------------+-------------------+           PSV cm/sEDV cmsDescribe        Arm Pressure (mmHG) +----------+--------+-------+----------------+-------------------+ [id number]            Multiphasic, WNL                    +----------+--------+-------+----------------+-------------------+ +---------+--------+--+--------+--+---------+ VertebralPSV cm/s44EDV cm/s13Antegrade +---------+--------+--+--------+--+---------+  Left Carotid Findings: +----------+--------+--------+--------+------------------+------------------+           PSV cm/sEDV cm/sStenosisPlaque DescriptionComments           +----------+--------+--------+--------+------------------+------------------+ CCA Prox  103     22                                                   +----------+--------+--------+--------+------------------+------------------+  CCA Distal74      16                                intimal thickening +----------+--------+--------+--------+------------------+------------------+ ICA Prox  85      27      1-39%   calcific                             +----------+--------+--------+--------+------------------+------------------+ ICA Mid   93      29                                                   +----------+--------+--------+--------+------------------+------------------+ ICA Distal88      29                                                   +----------+--------+--------+--------+------------------+------------------+ ECA       119     14                                                    +----------+--------+--------+--------+------------------+------------------+ +----------+--------+--------+----------------+-------------------+           PSV cm/sEDV cm/sDescribe        Arm Pressure (mmHG) +----------+--------+--------+----------------+-------------------+ [id number]             Multiphasic, WNL                    +----------+--------+--------+----------------+-------------------+ +---------+--------+--+--------+--+---------+ VertebralPSV cm/s47EDV cm/s14Antegrade +---------+--------+--+--------+--+---------+   Summary: Right Carotid: Velocities in the right ICA are consistent with a 1-39% stenosis. Left Carotid: Velocities in the left ICA are consistent with a 1-39% stenosis. Vertebrals:  Bilateral vertebral arteries demonstrate antegrade flow. Subclavians: Normal flow hemodynamics were seen in bilateral subclavian              arteries. *See table(s) above for measurements and observations.  Electronically signed by Eather Popp MD on 06/02/2022 at 4:01:09 PM.    Final     Assessment & Plan:   Problem List Items Addressed This Visit     Dyslipidemia   Better on Tamsulosin        Anxiety disorder - Primary   Cont on Wellbutrin  Xanax  prn  Potential benefits of a long term benzodiazepines  use as well as potential risks  and complications were explained to the patient and were aknowledged.      Relevant Medications   buPROPion  (WELLBUTRIN  SR) 150 MG 12 hr tablet   Depression with anxiety   Xanax  as needed anxiety.  Potential benefits of a long term benzodiazepines  use as well as potential risks  and complications were explained to the patient and were aknowledged. On Wellbutrin ,  Aricept       Relevant Medications   buPROPion  (WELLBUTRIN  SR) 150 MG 12 hr tablet   Mild cognitive disorder   Stable.  No change.  Test hearing On Aricept       Hip pain, chronic, right   ROHO cushion Rx  Relevant Medications   buPROPion   (WELLBUTRIN  SR) 150 MG 12 hr tablet      Meds ordered this encounter  Medications   buPROPion  (WELLBUTRIN  SR) 150 MG 12 hr tablet    Sig: Take 1 tablet (150 mg total) by mouth 2 (two) times daily.    Dispense:  180 tablet    Refill:  3   oseltamivir  (TAMIFLU ) 75 MG capsule    Sig: Take 1 capsule (75 mg total) by mouth 2 (two) times daily.    Dispense:  10 capsule    Refill:  0      Follow-up: Return in about 3 months (around 08/07/2024) for a follow-up visit.  Marolyn Noel, MD "

## 2024-05-13 NOTE — Assessment & Plan Note (Signed)
 Stable.  No change.  Test hearing On Aricept 

## 2024-05-13 NOTE — Assessment & Plan Note (Signed)
 Xanax  as needed anxiety.  Potential benefits of a long term benzodiazepines  use as well as potential risks  and complications were explained to the patient and were aknowledged. On Wellbutrin ,  Aricept 

## 2024-05-15 ENCOUNTER — Ambulatory Visit: Admitting: Podiatry

## 2024-05-16 ENCOUNTER — Ambulatory Visit

## 2024-05-16 ENCOUNTER — Ambulatory Visit: Payer: Medicare Other

## 2024-05-20 ENCOUNTER — Other Ambulatory Visit: Payer: Self-pay | Admitting: Internal Medicine

## 2024-05-24 ENCOUNTER — Ambulatory Visit: Admitting: Podiatry

## 2024-08-07 ENCOUNTER — Encounter: Admitting: Internal Medicine

## 2024-08-09 ENCOUNTER — Ambulatory Visit: Admitting: Cardiovascular Disease

## 2024-09-06 ENCOUNTER — Encounter: Admitting: Internal Medicine

## 2024-11-13 ENCOUNTER — Ambulatory Visit: Admitting: Adult Health
